# Patient Record
Sex: Female | Born: 1935 | Race: Black or African American | Hispanic: No | Marital: Single | Smoking: Never smoker
Health system: Southern US, Community
[De-identification: ages and names within clinical notes are randomized; demographics above are authoritative.]

## PROBLEM LIST (undated history)

## (undated) DIAGNOSIS — J111 Influenza due to unidentified influenza virus with other respiratory manifestations: Secondary | ICD-10-CM

## (undated) DIAGNOSIS — I480 Paroxysmal atrial fibrillation: Secondary | ICD-10-CM

## (undated) DIAGNOSIS — J40 Bronchitis, not specified as acute or chronic: Secondary | ICD-10-CM

## (undated) DIAGNOSIS — F0390 Unspecified dementia without behavioral disturbance: Secondary | ICD-10-CM

## (undated) DIAGNOSIS — I739 Peripheral vascular disease, unspecified: Secondary | ICD-10-CM

## (undated) DIAGNOSIS — I428 Other cardiomyopathies: Secondary | ICD-10-CM

## (undated) DIAGNOSIS — E039 Hypothyroidism, unspecified: Secondary | ICD-10-CM

## (undated) DIAGNOSIS — I779 Disorder of arteries and arterioles, unspecified: Secondary | ICD-10-CM

## (undated) DIAGNOSIS — Z8673 Personal history of transient ischemic attack (TIA), and cerebral infarction without residual deficits: Secondary | ICD-10-CM

## (undated) DIAGNOSIS — I701 Atherosclerosis of renal artery: Secondary | ICD-10-CM

## (undated) DIAGNOSIS — I10 Essential (primary) hypertension: Secondary | ICD-10-CM

## (undated) DIAGNOSIS — I2510 Atherosclerotic heart disease of native coronary artery without angina pectoris: Secondary | ICD-10-CM

## (undated) DIAGNOSIS — E119 Type 2 diabetes mellitus without complications: Secondary | ICD-10-CM

## (undated) HISTORY — DX: Disorder of arteries and arterioles, unspecified: I77.9

## (undated) HISTORY — DX: Hypothyroidism, unspecified: E03.9

## (undated) HISTORY — DX: Other cardiomyopathies: I42.8

## (undated) HISTORY — DX: Peripheral vascular disease, unspecified: I73.9

## (undated) HISTORY — PX: ABDOMINAL HYSTERECTOMY: SHX81

## (undated) HISTORY — DX: Personal history of transient ischemic attack (TIA), and cerebral infarction without residual deficits: Z86.73

## (undated) HISTORY — DX: Essential (primary) hypertension: I10

## (undated) HISTORY — DX: Atherosclerosis of renal artery: I70.1

---

## 2003-05-16 ENCOUNTER — Emergency Department (HOSPITAL_COMMUNITY): Admission: EM | Admit: 2003-05-16 | Discharge: 2003-05-16 | Payer: Self-pay | Admitting: Emergency Medicine

## 2003-05-16 ENCOUNTER — Encounter: Payer: Self-pay | Admitting: Emergency Medicine

## 2003-09-14 ENCOUNTER — Emergency Department (HOSPITAL_COMMUNITY): Admission: EM | Admit: 2003-09-14 | Discharge: 2003-09-14 | Payer: Self-pay | Admitting: Emergency Medicine

## 2003-09-14 ENCOUNTER — Encounter: Payer: Self-pay | Admitting: Emergency Medicine

## 2003-09-28 ENCOUNTER — Emergency Department (HOSPITAL_COMMUNITY): Admission: EM | Admit: 2003-09-28 | Discharge: 2003-09-28 | Payer: Self-pay | Admitting: Emergency Medicine

## 2003-12-12 ENCOUNTER — Emergency Department (HOSPITAL_COMMUNITY): Admission: EM | Admit: 2003-12-12 | Discharge: 2003-12-12 | Payer: Self-pay | Admitting: Internal Medicine

## 2004-01-21 ENCOUNTER — Inpatient Hospital Stay (HOSPITAL_COMMUNITY): Admission: EM | Admit: 2004-01-21 | Discharge: 2004-01-24 | Payer: Self-pay | Admitting: Emergency Medicine

## 2004-02-24 ENCOUNTER — Ambulatory Visit (HOSPITAL_COMMUNITY): Admission: RE | Admit: 2004-02-24 | Discharge: 2004-02-24 | Payer: Self-pay | Admitting: *Deleted

## 2004-04-19 ENCOUNTER — Emergency Department (HOSPITAL_COMMUNITY): Admission: EM | Admit: 2004-04-19 | Discharge: 2004-04-19 | Payer: Self-pay | Admitting: *Deleted

## 2004-06-02 ENCOUNTER — Ambulatory Visit (HOSPITAL_COMMUNITY): Admission: RE | Admit: 2004-06-02 | Discharge: 2004-06-03 | Payer: Self-pay | Admitting: *Deleted

## 2004-09-02 ENCOUNTER — Emergency Department (HOSPITAL_COMMUNITY): Admission: EM | Admit: 2004-09-02 | Discharge: 2004-09-02 | Payer: Self-pay | Admitting: Emergency Medicine

## 2004-10-28 ENCOUNTER — Emergency Department (HOSPITAL_COMMUNITY): Admission: EM | Admit: 2004-10-28 | Discharge: 2004-10-29 | Payer: Self-pay | Admitting: Emergency Medicine

## 2005-02-28 ENCOUNTER — Emergency Department (HOSPITAL_COMMUNITY): Admission: EM | Admit: 2005-02-28 | Discharge: 2005-03-01 | Payer: Self-pay | Admitting: Emergency Medicine

## 2005-04-09 ENCOUNTER — Emergency Department (HOSPITAL_COMMUNITY): Admission: EM | Admit: 2005-04-09 | Discharge: 2005-04-09 | Payer: Self-pay | Admitting: Emergency Medicine

## 2005-05-17 ENCOUNTER — Inpatient Hospital Stay (HOSPITAL_COMMUNITY): Admission: EM | Admit: 2005-05-17 | Discharge: 2005-05-19 | Payer: Self-pay | Admitting: Emergency Medicine

## 2005-05-17 ENCOUNTER — Ambulatory Visit: Payer: Self-pay | Admitting: Cardiology

## 2005-06-23 ENCOUNTER — Ambulatory Visit: Payer: Self-pay | Admitting: *Deleted

## 2005-10-24 ENCOUNTER — Emergency Department (HOSPITAL_COMMUNITY): Admission: EM | Admit: 2005-10-24 | Discharge: 2005-10-24 | Payer: Self-pay | Admitting: Emergency Medicine

## 2007-01-07 ENCOUNTER — Emergency Department (HOSPITAL_COMMUNITY): Admission: EM | Admit: 2007-01-07 | Discharge: 2007-01-07 | Payer: Self-pay | Admitting: Emergency Medicine

## 2007-11-25 ENCOUNTER — Inpatient Hospital Stay (HOSPITAL_COMMUNITY): Admission: EM | Admit: 2007-11-25 | Discharge: 2007-11-29 | Payer: Self-pay | Admitting: Emergency Medicine

## 2007-11-25 ENCOUNTER — Ambulatory Visit: Payer: Self-pay | Admitting: Cardiology

## 2007-11-25 ENCOUNTER — Encounter (INDEPENDENT_AMBULATORY_CARE_PROVIDER_SITE_OTHER): Payer: Self-pay | Admitting: Internal Medicine

## 2008-04-18 ENCOUNTER — Emergency Department (HOSPITAL_COMMUNITY): Admission: EM | Admit: 2008-04-18 | Discharge: 2008-04-18 | Payer: Self-pay | Admitting: Emergency Medicine

## 2009-12-06 ENCOUNTER — Emergency Department (HOSPITAL_COMMUNITY): Admission: EM | Admit: 2009-12-06 | Discharge: 2009-12-06 | Payer: Self-pay | Admitting: Emergency Medicine

## 2010-03-05 ENCOUNTER — Ambulatory Visit (HOSPITAL_COMMUNITY): Admission: RE | Admit: 2010-03-05 | Discharge: 2010-03-05 | Payer: Self-pay | Admitting: Family Medicine

## 2010-03-16 ENCOUNTER — Encounter (INDEPENDENT_AMBULATORY_CARE_PROVIDER_SITE_OTHER): Payer: Self-pay | Admitting: Family Medicine

## 2010-03-16 ENCOUNTER — Ambulatory Visit (HOSPITAL_COMMUNITY): Admission: RE | Admit: 2010-03-16 | Discharge: 2010-03-16 | Payer: Self-pay | Admitting: Family Medicine

## 2010-03-16 ENCOUNTER — Ambulatory Visit: Payer: Self-pay | Admitting: Cardiology

## 2010-03-26 ENCOUNTER — Ambulatory Visit: Payer: Self-pay | Admitting: Cardiology

## 2010-03-26 ENCOUNTER — Encounter (INDEPENDENT_AMBULATORY_CARE_PROVIDER_SITE_OTHER): Payer: Self-pay | Admitting: Family Medicine

## 2010-03-26 ENCOUNTER — Inpatient Hospital Stay (HOSPITAL_COMMUNITY): Admission: EM | Admit: 2010-03-26 | Discharge: 2010-03-28 | Payer: Self-pay | Admitting: Emergency Medicine

## 2010-03-29 DIAGNOSIS — J45909 Unspecified asthma, uncomplicated: Secondary | ICD-10-CM | POA: Insufficient documentation

## 2010-03-29 DIAGNOSIS — I428 Other cardiomyopathies: Secondary | ICD-10-CM | POA: Insufficient documentation

## 2010-03-29 DIAGNOSIS — I639 Cerebral infarction, unspecified: Secondary | ICD-10-CM | POA: Insufficient documentation

## 2010-03-29 DIAGNOSIS — I509 Heart failure, unspecified: Secondary | ICD-10-CM | POA: Insufficient documentation

## 2010-03-30 ENCOUNTER — Encounter (INDEPENDENT_AMBULATORY_CARE_PROVIDER_SITE_OTHER): Payer: Self-pay | Admitting: *Deleted

## 2010-03-30 LAB — CONVERTED CEMR LAB
BUN: 55 mg/dL
Brain Natriuretic Peptide: 30.4
CO2: 21 meq/L
Calcium: 9.5 mg/dL
Chloride: 107 meq/L
Creatinine, Ser: 1.92 mg/dL
Free T4: 6.1 ng/dL
Glucose, Bld: 128 mg/dL
Potassium: 4.6 meq/L
Sodium: 141 meq/L
TSH: 1.142 microintl units/mL

## 2010-04-06 ENCOUNTER — Ambulatory Visit: Payer: Self-pay | Admitting: Cardiology

## 2010-04-06 DIAGNOSIS — I2510 Atherosclerotic heart disease of native coronary artery without angina pectoris: Secondary | ICD-10-CM | POA: Insufficient documentation

## 2010-04-06 DIAGNOSIS — I10 Essential (primary) hypertension: Secondary | ICD-10-CM | POA: Insufficient documentation

## 2010-04-07 ENCOUNTER — Encounter: Payer: Self-pay | Admitting: Cardiology

## 2010-07-07 ENCOUNTER — Encounter (INDEPENDENT_AMBULATORY_CARE_PROVIDER_SITE_OTHER): Payer: Self-pay | Admitting: *Deleted

## 2010-07-08 ENCOUNTER — Ambulatory Visit: Payer: Self-pay | Admitting: Cardiology

## 2010-07-08 DIAGNOSIS — E785 Hyperlipidemia, unspecified: Secondary | ICD-10-CM

## 2010-07-08 DIAGNOSIS — E1169 Type 2 diabetes mellitus with other specified complication: Secondary | ICD-10-CM | POA: Insufficient documentation

## 2010-07-13 ENCOUNTER — Emergency Department (HOSPITAL_COMMUNITY): Admission: EM | Admit: 2010-07-13 | Discharge: 2010-07-13 | Payer: Self-pay | Admitting: Emergency Medicine

## 2010-07-16 ENCOUNTER — Encounter (INDEPENDENT_AMBULATORY_CARE_PROVIDER_SITE_OTHER): Payer: Self-pay | Admitting: *Deleted

## 2010-10-01 ENCOUNTER — Encounter: Payer: Self-pay | Admitting: Cardiology

## 2010-10-02 LAB — CONVERTED CEMR LAB
ALT: <8 units/L (ref 0–35)
AST: 11 units/L (ref 0–37)
Albumin: 4.2 g/dL (ref 3.5–5.2)
Alkaline Phosphatase: 102 units/L (ref 39–117)
BUN: 13 mg/dL (ref 6–23)
Bilirubin, Direct: 0.1 mg/dL (ref 0.0–0.3)
CO2: 23 meq/L (ref 19–32)
Calcium: 9.4 mg/dL (ref 8.4–10.5)
Chloride: 108 meq/L (ref 96–112)
Cholesterol: 155 mg/dL (ref 0–200)
Creatinine, Ser: 1.07 mg/dL (ref 0.40–1.20)
Glucose, Bld: 105 mg/dL — ABNORMAL HIGH (ref 70–99)
HDL: 32 mg/dL — ABNORMAL LOW (ref >39)
Indirect Bilirubin: 0.2 mg/dL (ref 0.0–0.9)
LDL Cholesterol: 91 mg/dL (ref 0–99)
Potassium: 3.6 meq/L (ref 3.5–5.3)
Pro B Natriuretic peptide (BNP): 155 pg/mL — ABNORMAL HIGH (ref 0.0–100.0)
Sodium: 144 meq/L (ref 135–145)
Total Bilirubin: 0.3 mg/dL (ref 0.3–1.2)
Total CHOL/HDL Ratio: 4.8
Total Protein: 6.7 g/dL (ref 6.0–8.3)
Triglycerides: 162 mg/dL — ABNORMAL HIGH (ref <150)
VLDL: 32 mg/dL (ref 0–40)

## 2010-10-04 ENCOUNTER — Encounter: Payer: Self-pay | Admitting: Cardiology

## 2010-10-06 ENCOUNTER — Ambulatory Visit: Payer: Self-pay | Admitting: Cardiology

## 2010-10-26 ENCOUNTER — Encounter: Payer: Self-pay | Admitting: Cardiology

## 2010-11-18 ENCOUNTER — Inpatient Hospital Stay (HOSPITAL_COMMUNITY): Admission: EM | Admit: 2010-11-18 | Discharge: 2010-10-18 | Payer: Self-pay | Admitting: Emergency Medicine

## 2010-11-26 ENCOUNTER — Ambulatory Visit: Payer: Self-pay | Admitting: Cardiology

## 2010-11-26 LAB — CONVERTED CEMR LAB
BUN: 9 mg/dL (ref 6–23)
CO2: 27 meq/L (ref 19–32)
Calcium: 9.6 mg/dL (ref 8.4–10.5)
Chloride: 105 meq/L (ref 96–112)
Creatinine, Ser: 1.09 mg/dL (ref 0.40–1.20)
Glucose, Bld: 107 mg/dL — ABNORMAL HIGH (ref 70–99)
Potassium: 4.1 meq/L (ref 3.5–5.3)
Sodium: 142 meq/L (ref 135–145)

## 2010-12-02 ENCOUNTER — Encounter: Payer: Self-pay | Admitting: Cardiology

## 2010-12-16 ENCOUNTER — Emergency Department (HOSPITAL_COMMUNITY)
Admission: EM | Admit: 2010-12-16 | Discharge: 2010-12-16 | Payer: Self-pay | Source: Home / Self Care | Admitting: Emergency Medicine

## 2010-12-19 ENCOUNTER — Emergency Department (HOSPITAL_COMMUNITY)
Admission: EM | Admit: 2010-12-19 | Discharge: 2010-12-19 | Payer: Self-pay | Source: Home / Self Care | Admitting: Emergency Medicine

## 2011-01-11 NOTE — Assessment & Plan Note (Signed)
Summary: **per Dr.McInnis for CHF/tg   Visit Type:  Initial Consult Primary Provider:  Dr. Butch Penny   History of Present Illness: 75 year old woman referred for cardiology consultation. She was followed in the past by Dr. Dorethea Clan with a history of nonobstructive CAD and nonischemic cardiomyopathy.  She was recently admitted to the hospital with left leg weakness and ultimately diagnosed with a right brain TIA. Followup 2-D echocardiogram is reviewed below.  Ms. Ratledge describes NYHA class 2-3 dyspnea on exertion, no chest pain, and reports compliance with her medications. She has not been weighing herself regularly. She does report a low salt diet. Today we discussed carbohydrate restriction and also considering a basic walking regimen.  Labs from late March revealed a potassium of 4.2, BUN 10, creatinine 1.0, BNP 356.  She denies any significant palpitations or syncope.  Current Medications (verified): 1)  Synthroid 75 Mcg Tabs (Levothyroxine Sodium) .... Take 1 Tab Daily 2)  Lisinopril 10 Mg Tabs (Lisinopril) .... Take 1 Tablet By Mouth Once Daily 3)  Lasix 40 Mg Tabs (Furosemide) .... Take 1 Tab Two Times A Day 4)  Coreg 12.5 Mg Tabs (Carvedilol) .... Take 1 Tab Two Times A Day 5)  Allopurinol 100 Mg Tabs (Allopurinol) .... Take As Needed 6)  Aspir-Low 81 Mg Tbec (Aspirin) .... Take 1 Tab Daily 7)  Potassium Chloride 20 Meq Pack (Potassium Chloride) .... Take 1 Tab Daily  Allergies (verified): No Known Drug Allergies  Past History:  Past Medical History: Last updated: 04/30/2010 Nonischemic cardiomyopathy, LVEF 20-25% Asthma Cerebrovascular Disease s/p TIA and stroke Hypertension Bilateral renal artery stenosis CAD - nonobstructive at catheterization 2005 Hypothyroidism Carotid artery disease  Past Surgical History: Last updated: 04-30-2010 Hysterectomy Cholecystectomy  Family History: Last updated: Apr 30, 2010 Father: died age 19, unknown causes Mother: died  age 98, unknown causes  Social History: Last updated: 04-30-2010 Tobacco Use - No Alcohol Use - no Regular Exercise - no Drug Use - no Retired  14 children  Clinical Review Panels:  Echocardiogram Echocardiogram  Study Conclusions    - Left ventricle: The cavity size was at the upper limits of normal.     Wall thickness was increased increased in a pattern of mild to     moderate LVH. Systolic function was severely reduced. The     estimated ejection fraction was in the range of 20% to 25%.     Akinesis of the inferior and posterior myocardium. Akinesis of the     anterolateral myocardium.Only the septum contracts.   - Left atrium: The atrium was mildly dilated.   - Right ventricle: The cavity size was normal. Wall thickness was     increased. Systolic function was mildly reduced.   - Atrial septum: No defect or patent foramen ovale was identified.   Impressions: (03/26/2010)  Cardiac Imaging Cardiac Cath Findings  CONCLUSION:  1. Nonobstructive coronary artery disease.  2. Nonischemic cardiomyopathy.  Possibly related to hypertension.   PLAN:  I have left a message for Dr. Samule Ohm to review the aortogram to see  if she might benefit from revascularization of her renal arteries  percutaneously.  I have discussed the case with Dr. Renard Matter.  I have also  discussed the case with Dr. Dorethea Clan who will follow her in [place].  I  have reinstituted the patient's captopril which apparently she was on but  ran out of.  We will start this at 25 mg q.8h.  I would like her to get a B-  MET next week  and follow with Dr. Dorethea Clan. (02/24/2004)    Review of Systems       The patient complains of dyspnea on exertion.  The patient denies anorexia, weight loss, chest pain, syncope, peripheral edema, prolonged cough, headaches, hemoptysis, melena, hematochezia, and severe indigestion/heartburn.         Reports no residual left leg weakness. Otherwise reviewed and negative except as  outlined.  Vital Signs:  Patient profile:   75 year old female Height:      62 inches Weight:      180 pounds BMI:     33.04 Pulse rate:   74 / minute BP sitting:   160 / 90  (right arm)  Vitals Entered By: Dreama Saa, CNA (April 06, 2010 1:51 PM)  Physical Exam  Additional Exam:  Overweight woman in no acute distress. HEENT: Conjunctiva and lids normal, oropharynx with moist mucosa. Neck: Supple, no elevated JVP is apparent, no bruits. Lungs: Diminished but clear overall, nonlabored. Cardiac: Regular rate and rhythm, no S3, soft systolic murmur. Abdomen: Obese, unable to palpate liver edge, bowel sounds present. Extremities: Trace ankle edema, symmetrical, distal pulses 1-2+. Skin: Warm and dry. Musculoskeletal: No kyphosis. Neuropsychiatric: Alert and oriented x3, affect appropriate.   EKG  Procedure date:  04/06/2010  Findings:      Sinus rhythm at 77 beats per minute with occasional PVC and IVCD with QRS 142 ms. Incomplete right bundle branch block pattern.  Impression & Recommendations:  Problem # 1:  CARDIOMYOPATHY (ICD-425.4)  History of nonischemic cardiomyopathy, LVEF recently documented in the range of 20-25%. She is now referred to reestablish follow up with our practice, having seen Dr. Dorethea Clan years ago. She reports stable NYHA class 2-3 dyspnea exertion, and appears fairly euvolemic. We talked about following weights regularly, avoiding salt, and considering a regular walking regimen with concurrent carbohydrate restriction for weight loss. Plan to see her back over the next 3 months.  The following medications were removed from the medication list:    Prinivil 10 Mg Tabs (Lisinopril) .Marland Kitchen... Take 1/2 tab daily Her updated medication list for this problem includes:    Lisinopril 10 Mg Tabs (Lisinopril) .Marland Kitchen... Take 1 tablet by mouth once daily    Lasix 40 Mg Tabs (Furosemide) .Marland Kitchen... Take 1 tab two times a day    Coreg 12.5 Mg Tabs (Carvedilol) .Marland Kitchen... Take 1 tab  two times a day    Aspir-low 81 Mg Tbec (Aspirin) .Marland Kitchen... Take 1 tab daily  Problem # 2:  ESSENTIAL HYPERTENSION, BENIGN (ICD-401.1)  Blood pressure not optimally controlled. We will increase lisinopril to 10 mg daily and followup with a BMET in 2 weeks.  The following medications were removed from the medication list:    Prinivil 10 Mg Tabs (Lisinopril) .Marland Kitchen... Take 1/2 tab daily Her updated medication list for this problem includes:    Lisinopril 10 Mg Tabs (Lisinopril) .Marland Kitchen... Take 1 tablet by mouth once daily    Lasix 40 Mg Tabs (Furosemide) .Marland Kitchen... Take 1 tab two times a day    Coreg 12.5 Mg Tabs (Carvedilol) .Marland Kitchen... Take 1 tab two times a day    Aspir-low 81 Mg Tbec (Aspirin) .Marland Kitchen... Take 1 tab daily  Problem # 3:  CORONARY ATHEROSCLEROSIS NATIVE CORONARY ARTERY (ICD-414.01)  History of nonobstructive CAD at catheterization in 2005. No active angina.  The following medications were removed from the medication list:    Prinivil 10 Mg Tabs (Lisinopril) .Marland Kitchen... Take 1/2 tab daily Her updated medication list for this problem  includes:    Lisinopril 10 Mg Tabs (Lisinopril) .Marland Kitchen... Take 1 tablet by mouth once daily    Coreg 12.5 Mg Tabs (Carvedilol) .Marland Kitchen... Take 1 tab two times a day    Aspir-low 81 Mg Tbec (Aspirin) .Marland Kitchen... Take 1 tab daily  Other Orders: Future Orders: T-Basic Metabolic Panel [id number]) ... 04/20/2010  Patient Instructions: 1)  Your physician recommends that you schedule a follow-up appointment in: 3months 2)  Your physician recommends that you return for lab work in: 2 weeks  3)  Your physician has recommended you make the following change in your medication: Increase Lisinopril to 10mg  by mouth once daily  Prescriptions: LISINOPRIL 10 MG TABS (LISINOPRIL) take 1 tablet by mouth once daily  #30 x 6   Entered by:   Larita Fife Via LPN   Authorized by:   Loreli Slot, MD, [facility]   Signed by:   Larita Fife Via LPN on 36/64/4034   Method used:   Electronically to        [facility]. [address] [phone]* (retail)       [address], [place]  [address]       Ph: [phone]       Fax: [phone]   RxID:   [id number]

## 2011-01-11 NOTE — Letter (Signed)
Summary: labs dr Renard Matter  labs dr Renard Matter   Imported By: Faythe Ghee 04/07/2010 10:29:38  _____________________________________________________________________  External Attachment:    Type:   Image     Comment:   External Document

## 2011-01-11 NOTE — Letter (Signed)
Summary: [facility] [employer]  [address], [place] [address]   Phone: [phone]  Fax: [phone]     April 06, 2010 MRN: [id number]   [name] [address] [place], [place]  [address]      YOUR LAB WORK IS DUE  Apr 20, 2010 _________________________________________  Please go to [facility], located across the street from [facility] on the second floor.  Hours are Monday - Friday 7am until 7:30pm         Saturday 8am until 12noon    __  DO NOT EAT OR DRINK AFTER MIDNIGHT EVENING PRIOR TO LABWORK  _X_ YOUR LABWORK IS NOT FASTING --YOU MAY EAT PRIOR TO LABWORK

## 2011-01-11 NOTE — Letter (Signed)
Summary: [facility] [employer]  [address], [place] [address]   Phone: [phone]  Fax: [phone]     October 06, 2010 MRN: [id number]   CHARRISSE MASLEY [address] [place], [place]  [address]      YOUR LAB WORK IS DUE  November 08, 2010 _________________________________________  Please go to [facility], located across the street from [facility] on the second floor.  Hours are Monday - Friday 7am until 7:30pm         Saturday 8am until 12noon    __  DO NOT EAT OR DRINK AFTER MIDNIGHT EVENING PRIOR TO LABWORK  _X_ YOUR LABWORK IS NOT FASTING --YOU MAY EAT PRIOR TO LABWORK

## 2011-01-11 NOTE — Miscellaneous (Signed)
Summary: LABS BMP,TSH,T4,BNP,03/30/2010  Clinical Lists Changes  Observations: Added new observation of CALCIUM: 9.5 mg/dL (81/19/1478 29:56) Added new observation of CREATININE: 1.92 mg/dL (21/30/8657 84:69) Added new observation of BUN: 55 mg/dL (62/95/2841 32:44) Added new observation of BG RANDOM: 128 mg/dL (12/14/7251 66:44) Added new observation of CO2 PLSM/SER: 21 meq/L (03/30/2010 13:50) Added new observation of CL SERUM: 107 meq/L (03/30/2010 13:50) Added new observation of K SERUM: 4.6 meq/L (03/30/2010 13:50) Added new observation of NA: 141 meq/L (03/30/2010 13:50) Added new observation of TSH: 1.142 microintl units/mL (03/30/2010 13:50) Added new observation of T4, FREE: 6.1 ng/dL (03/47/4259 56:38) Added new observation of BNP: 30.4  (03/30/2010 13:50)

## 2011-01-11 NOTE — Assessment & Plan Note (Signed)
Summary: 3 mth f/u per checkout on 04/06/10/tg   Visit Type:  Follow-up Primary Provider:  Dr. Butch Penny   History of Present Illness: 75 year old woman presents for followup. She did not followup for blood work after her last visit. It is not entirely clear that she has been taking all her medications either. From a symptom perspective, she reports feeling fatigued in the mornings, although this generally improves as the day goes on. No orthopnea, PND, or progressive lower extremity edema. No chest pain with exertion.  Current Medications (verified): 1)  Lisinopril 10 Mg Tabs (Lisinopril) .... Take 1 Tablet By Mouth Once Daily 2)  Furosemide 20 Mg Tabs (Furosemide) .... Take 1 Tablet By Mouth Two Times A Day 3)  Coreg 12.5 Mg Tabs (Carvedilol) .... Take 1 Tab Two Times A Day 4)  Allopurinol 100 Mg Tabs (Allopurinol) .... Take As Needed 5)  Aspir-Low 81 Mg Tbec (Aspirin) .... Take 1 Tab Daily 6)  Potassium Chloride Crys Cr 20 Meq Cr-Tabs (Potassium Chloride Crys Cr) .... Take 1 Tablet By Mouth Once A Day  Allergies (verified): No Known Drug Allergies  Past History:  Past Medical History: Last updated: 04/02/2010 Nonischemic cardiomyopathy, LVEF 20-25% Asthma Cerebrovascular Disease s/p TIA and stroke Hypertension Bilateral renal artery stenosis CAD - nonobstructive at catheterization 2005 Hypothyroidism Carotid artery disease  Social History: Last updated: 04/02/2010 Tobacco Use - No Alcohol Use - no Regular Exercise - no Drug Use - no Retired  14 children  Clinical Review Panels:  Echocardiogram Echocardiogram  Study Conclusions    - Left ventricle: The cavity size was at the upper limits of normal.     Wall thickness was increased increased in a pattern of mild to     moderate LVH. Systolic function was severely reduced. The     estimated ejection fraction was in the range of 20% to 25%.     Akinesis of the inferior and posterior myocardium. Akinesis of the    anterolateral myocardium.Only the septum contracts.   - Left atrium: The atrium was mildly dilated.   - Right ventricle: The cavity size was normal. Wall thickness was     increased. Systolic function was mildly reduced.   - Atrial septum: No defect or patent foramen ovale was identified.   Impressions: (03/26/2010)    Review of Systems       The patient complains of dyspnea on exertion.  The patient denies anorexia, fever, chest pain, syncope, peripheral edema, melena, and hematochezia.         No unusual weight gain. Otherwise reviewed and negative except as outlined.  Vital Signs:  Patient profile:   75 year old female Height:      62 inches Weight:      177 pounds O2 Sat:      94 % on Room air Temp:     98.7 degrees F oral Pulse rate:   70 / minute BP sitting:   151 / 86  (left arm)  Vitals Entered By: Teressa Lower RN (July 08, 2010 11:06 AM)  O2 Flow:  Room air  Physical Exam  Additional Exam:  Overweight woman in no acute distress. HEENT: Conjunctiva and lids normal, oropharynx with moist mucosa. Neck: Supple, no elevated JVP is apparent, no bruits. Lungs: Diminished but clear overall, nonlabored. Cardiac: Regular rate and rhythm, no S3, soft systolic murmur. Abdomen: Obese, unable to palpate liver edge, bowel sounds present. Extremities: Trace ankle edema, symmetrical, distal pulses 1-2+. Skin: Warm and dry. Musculoskeletal:  No kyphosis. Neuropsychiatric: Alert and oriented x3, affect appropriate.   Impression & Recommendations:  Problem # 1:  ESSENTIAL HYPERTENSION, BENIGN (ICD-401.1)  Blood pressure not optimal, but coming under better control. She reports tolerating the increase in lisinopril made at the last visit. She does need followup labs to include a BMET and BNP. These will be arranged.  Her updated medication list for this problem includes:    Lisinopril 10 Mg Tabs (Lisinopril) .Marland Kitchen... Take 1 tablet by mouth once daily    Furosemide 20 Mg Tabs  (Furosemide) .Marland Kitchen... Take 1 tablet by mouth two times a day    Coreg 12.5 Mg Tabs (Carvedilol) .Marland Kitchen... Take 1 tab two times a day    Aspir-low 81 Mg Tbec (Aspirin) .Marland Kitchen... Take 1 tab daily  Orders: T-Basic Metabolic Panel [id number])  Her updated medication list for this problem includes:    Lisinopril 10 Mg Tabs (Lisinopril) .Marland Kitchen... Take 1 tablet by mouth once daily    Furosemide 20 Mg Tabs (Furosemide) .Marland Kitchen... Take 1 tablet by mouth two times a day    Coreg 12.5 Mg Tabs (Carvedilol) .Marland Kitchen... Take 1 tab two times a day    Aspir-low 81 Mg Tbec (Aspirin) .Marland Kitchen... Take 1 tab daily  Problem # 2:  CORONARY ATHEROSCLEROSIS NATIVE CORONARY ARTERY (ICD-414.01)  No reported angina with prior documentation of nonobstructive atherosclerosis. From the perspective of risk modification, a lipid profile will be obtained. May also need to consider statin therapy.  Her updated medication list for this problem includes:    Lisinopril 10 Mg Tabs (Lisinopril) .Marland Kitchen... Take 1 tablet by mouth once daily    Coreg 12.5 Mg Tabs (Carvedilol) .Marland Kitchen... Take 1 tab two times a day    Aspir-low 81 Mg Tbec (Aspirin) .Marland Kitchen... Take 1 tab daily  Her updated medication list for this problem includes:    Lisinopril 10 Mg Tabs (Lisinopril) .Marland Kitchen... Take 1 tablet by mouth once daily    Coreg 12.5 Mg Tabs (Carvedilol) .Marland Kitchen... Take 1 tab two times a day    Aspir-low 81 Mg Tbec (Aspirin) .Marland Kitchen... Take 1 tab daily  Problem # 3:  CARDIOMYOPATHY (ICD-425.4)  Nonischemic cardiopathy. Euvolemic on examination. Reviewed medications and reinforced compliance. Further adjustments to be made gradually depending on her compliance. Device therapy not being pursued. Followup in 3 months.  Her updated medication list for this problem includes:    Lisinopril 10 Mg Tabs (Lisinopril) .Marland Kitchen... Take 1 tablet by mouth once daily    Furosemide 20 Mg Tabs (Furosemide) .Marland Kitchen... Take 1 tablet by mouth two times a day    Coreg 12.5 Mg Tabs (Carvedilol) .Marland Kitchen... Take 1 tab two times a  day    Aspir-low 81 Mg Tbec (Aspirin) .Marland Kitchen... Take 1 tab daily  Orders: T-Basic Metabolic Panel [id number])  Her updated medication list for this problem includes:    Lisinopril 10 Mg Tabs (Lisinopril) .Marland Kitchen... Take 1 tablet by mouth once daily    Furosemide 20 Mg Tabs (Furosemide) .Marland Kitchen... Take 1 tablet by mouth two times a day    Coreg 12.5 Mg Tabs (Carvedilol) .Marland Kitchen... Take 1 tab two times a day    Aspir-low 81 Mg Tbec (Aspirin) .Marland Kitchen... Take 1 tab daily  Other Orders: T-BNP  (B Natriuretic Peptide) [phone]) T-Lipid Profile ([id number]) T-Hepatic Function [phone])  Patient Instructions: 1)  Your physician recommends that you schedule a follow-up appointment in: 3 months 2)  Your physician recommends that you return for lab work in: Tomorrow 3)  Your physician recommends that you continue  on your current medications as directed. Please refer to the Current Medication list given to you today.

## 2011-01-11 NOTE — Letter (Signed)
Summary: [facility]  [facility]   Imported By: Faythe Ghee 10/26/2010 16:08:16  _____________________________________________________________________  External Attachment:    Type:   Image     Comment:   External Document

## 2011-01-11 NOTE — Assessment & Plan Note (Signed)
Summary: 3 mth f/u per checkout on 07/08/10/tg   Visit Type:  Follow-up Primary Provider:  Dr. Butch Penny   History of Present Illness: 75 year old woman presents for followup. I saw her back in July. She denies any progressive shortness of breath or chest pain. No palpitations or syncope. She states that she takes her medicines regularly when she is able to afford them. We discussed compliance and also sodium restriction. She seems to be euvolemic based on recent labs and examination. Weight is down a few pounds today.  Followup labs from 21 October show sodium 144, potassium 3.6, BUN 13, creatinine 1.0, cholesterol 155, triglycerides 162, HDL 32, LDL 91, AST 11, ALT less than 8, BNP 155. We discussed these results.  Clinical Review Panels:  Echocardiogram Echocardiogram  Study Conclusions    - Left ventricle: The cavity size was at the upper limits of normal.     Wall thickness was increased increased in a pattern of mild to     moderate LVH. Systolic function was severely reduced. The     estimated ejection fraction was in the range of 20% to 25%.     Akinesis of the inferior and posterior myocardium. Akinesis of the     anterolateral myocardium.Only the septum contracts.   - Left atrium: The atrium was mildly dilated.   - Right ventricle: The cavity size was normal. Wall thickness was     increased. Systolic function was mildly reduced.   - Atrial septum: No defect or patent foramen ovale was identified.   Impressions: (03/26/2010)    Current Medications (verified): 1)  Furosemide 20 Mg Tabs (Furosemide) .... Take 1 Tablet By Mouth Two Times A Day 2)  Coreg 12.5 Mg Tabs (Carvedilol) .... Take 1 Tab Two Times A Day 3)  Allopurinol 100 Mg Tabs (Allopurinol) .... Take As Needed 4)  Aspir-Low 81 Mg Tbec (Aspirin) .... Take 1 Tab Daily 5)  Potassium Chloride Crys Cr 20 Meq Cr-Tabs (Potassium Chloride Crys Cr) .... Take 1 Tablet By Mouth Once A Day 6)  Lisinopril 20 Mg Tabs  (Lisinopril) .... Take 1 Tablet By Mouth Once Daily  Allergies (verified): No Known Drug Allergies  Comments:  Nurse/Medical Assistant: patient reviewed meds from previous ov and stated that meds are correct she is out of asa and is going to get more .  Past History:  Past Medical History: Last updated: 04/02/2010 Nonischemic cardiomyopathy, LVEF 20-25% Asthma Cerebrovascular Disease s/p TIA and stroke Hypertension Bilateral renal artery stenosis CAD - nonobstructive at catheterization 2005 Hypothyroidism Carotid artery disease  Social History: Last updated: 04/02/2010 Tobacco Use - No Alcohol Use - no Regular Exercise - no Drug Use - no Retired  14 children  Review of Systems       The patient complains of peripheral edema.  The patient denies anorexia, fever, weight gain, chest pain, syncope, dyspnea on exertion, prolonged cough, hemoptysis, melena, and hematochezia.         Occasional dependent edema, not progressive, no orthopnea or PND. Otherwise reviewed and negative.  Vital Signs:  Patient profile:   75 year old female Weight:      173 pounds BMI:     31.76 Pulse rate:   56 / minute BP sitting:   178 / 84  (right arm)  Vitals Entered By: Dreama Saa, CNA (October 06, 2010 1:00 PM)   Physical Exam  Additional Exam:  Overweight woman in no acute distress. HEENT: Conjunctiva and lids normal, oropharynx with moist mucosa. Neck:  Supple, no elevated JVP is apparent, no bruits. Lungs: Diminished but clear overall, nonlabored. Cardiac: Regular rate and rhythm, no S3, soft systolic murmur. Abdomen: Obese, unable to palpate liver edge, bowel sounds present. Extremities: Trace ankle edema, symmetrical, distal pulses 1-2+. Skin: Warm and dry. Musculoskeletal: No kyphosis. Neuropsychiatric: Alert and oriented x3, affect appropriate.   Impression & Recommendations:  Problem # 1:  ESSENTIAL HYPERTENSION, BENIGN (ICD-401.1)  Not well controlled. We will  advance lisinopril to 20 mg daily. Continue other medications. We discussed sodium restriction. I plan to see her back in the next 6 weeks with a BMET.  Her updated medication list for this problem includes:    Furosemide 20 Mg Tabs (Furosemide) .Marland Kitchen... Take 1 tablet by mouth two times a day    Coreg 12.5 Mg Tabs (Carvedilol) .Marland Kitchen... Take 1 tab two times a day    Aspir-low 81 Mg Tbec (Aspirin) .Marland Kitchen... Take 1 tab daily    Lisinopril 20 Mg Tabs (Lisinopril) .Marland Kitchen... Take 1 tablet by mouth once daily  Future Orders: T-Basic Metabolic Panel [id number]) ... 11/08/2010  Problem # 2:  CARDIOMYOPATHY (ICD-425.4)  Euvolemic with nonischemic myopathy. No change in diuretic regimen. Recent renal function normal.  Her updated medication list for this problem includes:    Furosemide 20 Mg Tabs (Furosemide) .Marland Kitchen... Take 1 tablet by mouth two times a day    Coreg 12.5 Mg Tabs (Carvedilol) .Marland Kitchen... Take 1 tab two times a day    Aspir-low 81 Mg Tbec (Aspirin) .Marland Kitchen... Take 1 tab daily    Lisinopril 20 Mg Tabs (Lisinopril) .Marland Kitchen... Take 1 tablet by mouth once daily  Problem # 3:  CORONARY ATHEROSCLEROSIS NATIVE CORONARY ARTERY (ICD-414.01)  Nonobstructive at catheterization 2005, no active angina.  Her updated medication list for this problem includes:    Coreg 12.5 Mg Tabs (Carvedilol) .Marland Kitchen... Take 1 tab two times a day    Aspir-low 81 Mg Tbec (Aspirin) .Marland Kitchen... Take 1 tab daily    Lisinopril 20 Mg Tabs (Lisinopril) .Marland Kitchen... Take 1 tablet by mouth once daily  Patient Instructions: 1)  Your physician recommends that you schedule a follow-up appointment in: 6 weeks 2)  Your physician recommends that you return for lab work in: 6 weeks, just before next office visit 3)  Your physician has recommended you make the following change in your medication: Increase Lisinopril 20mg  by mouth at bedtime  Prescriptions: LISINOPRIL 20 MG TABS (LISINOPRIL) take 1 tablet by mouth once daily  #30 x 3   Entered by:   Larita Fife Via LPN    Authorized by:   Loreli Slot, MD, [facility]   Signed by:   Larita Fife Via LPN on 09/81/1914   Method used:   Electronically to        [facility] [phone]* (retail)       [address] [place], [place]  [address]       Ph: [phone]       Fax: [phone]   RxID:   [id number] CRESTOR 20 MG TABS (ROSUVASTATIN CALCIUM) take 1 tablet by mouth at bedtime  #30 x 3   Entered by:   Larita Fife Via LPN   Authorized by:   Loreli Slot, MD, [facility]   Signed by:   Larita Fife Via LPN on 42/59/5638   Method used:   Electronically to        [facility] [phone]* (retail)       [address]  [place], [place]  [address]       Ph: [phone]       Fax: [phone]   RxID:   [id number]  Pt. does not take Crestor, pharmacy notified

## 2011-01-11 NOTE — Letter (Signed)
Summary: [place] [facility], [facility] at [facility]  [address], [place] [address]   Phone: [phone]  Fax: [phone]      October 04, 2010 MRN: [id number]   STEVE GREGG [address] [place], [place]  [address]   Dear Ms. Garton,  Your test ordered by Selena Batten has been reviewed by your physician (or physician assistant) and was found to be normal or stable. Your physician (or physician assistant) felt no changes were needed at this time.  ____ Echocardiogram  ____ Cardiac Stress Test  __X__ Lab Work  ____ Peripheral vascular study of arms, legs or neck  ____ CT scan or X-ray  ____ Lung or Breathing test  ____ Other:  Please continue on current medical treatment. Thank you.   Nona Dell, MD, F.A.C.C

## 2011-01-11 NOTE — Letter (Signed)
Summary: progress note dr Renard Matter  progress note dr Renard Matter   Imported By: Faythe Ghee 04/07/2010 10:29:09  _____________________________________________________________________  External Attachment:    Type:   Image     Comment:   External Document

## 2011-01-11 NOTE — Letter (Signed)
Summary: Appointment - Reschedule  [facility] [place]  [address], [place] [address]   Phone: [phone]  Fax: [phone]     July 16, 2010 MRN: [id number]   [name] [address] [place], [place]  [address]   Dear Ms. Kilgour,   Due to a change in our office schedule, your appointment on       10/06/10                at    11:00  was changed to 10/06/10 at  1:00 Please contact us at the number listed above with any questions.    Sincerely,  Glass blower/designer

## 2011-01-13 NOTE — Assessment & Plan Note (Signed)
Summary: 6 wk f/u per checkout on 10/06/10/tg   Visit Type:  Follow-up Primary Provider:  Dr. Butch Penny   History of Present Illness: 75 year old woman presents for followup. Patient was seen back in October. She reports no progressive breathlessness, feels generally weak, but has had no exertional chest pain or peripheral edema. She does not exercise regularly. Talked about a basic walking regimen. Also reminded her about diet and sodium restriction.  ACE Inhibitor dosing was advanced at the last visit, and followup labs from 15 December showed sodium 142, potassium 4.1, BUN 9, creatinine 1.0. She tolerated this. I also noted she was placed on low-dose Benicar. Blood pressure is still not optimally controlled.  Clinical Review Panels:  Echocardiogram Echocardiogram  Study Conclusions    - Left ventricle: The cavity size was at the upper limits of normal.     Wall thickness was increased increased in a pattern of mild to     moderate LVH. Systolic function was severely reduced. The     estimated ejection fraction was in the range of 20% to 25%.     Akinesis of the inferior and posterior myocardium. Akinesis of the     anterolateral myocardium.Only the septum contracts.   - Left atrium: The atrium was mildly dilated.   - Right ventricle: The cavity size was normal. Wall thickness was     increased. Systolic function was mildly reduced.   - Atrial septum: No defect or patent foramen ovale was identified.   Impressions: (03/26/2010)    Current Medications (verified): 1)  Furosemide 20 Mg Tabs (Furosemide) .... Take 1 Tablet By Mouth Two Times A Day 2)  Coreg 12.5 Mg Tabs (Carvedilol) .... Take 1 Tab Two Times A Day 3)  Allopurinol 100 Mg Tabs (Allopurinol) .... Take As Needed 4)  Aspir-Low 81 Mg Tbec (Aspirin) .... Take 1 Tab Daily 5)  Potassium Chloride Crys Cr 20 Meq Cr-Tabs (Potassium Chloride Crys Cr) .... Take 1 Tablet By Mouth Once A Day 6)  Lisinopril 40 Mg Tabs  (Lisinopril) .... Take One Tablet By Mouth Daily 7)  Benicar 20 Mg Tabs (Olmesartan Medoxomil) .... Take 1/2 Tab Daily 8)  Clonidine Hcl 0.1 Mg Tabs (Clonidine Hcl) .... Take 1 Tab Q12 Hrs  Allergies (verified): No Known Drug Allergies  Comments:  Nurse/Medical Assistant: patient reviewed meds from previous ov and stated all meds are correct benicar 20mg  1/2 tab has been assed and clonidine 0.1 mg q 12 hrs [facility] is patients pharmacy  Past History:  Past Medical History: Last updated: 04/02/2010 Nonischemic cardiomyopathy, LVEF 20-25% Asthma Cerebrovascular Disease s/p TIA and stroke Hypertension Bilateral renal artery stenosis CAD - nonobstructive at catheterization 2005 Hypothyroidism Carotid artery disease  Social History: Last updated: 04/02/2010 Tobacco Use - No Alcohol Use - no Regular Exercise - no Drug Use - no Retired  14 children  Review of Systems       The patient complains of dyspnea on exertion.  The patient denies anorexia, fever, weight gain, chest pain, syncope, peripheral edema, prolonged cough, melena, and hematochezia.         Otherwise reviewed and negative except as outlined.  Vital Signs:  Patient profile:   75 year old female Weight:      173 pounds O2 Sat:      94 % on Room air Pulse rate:   65 / minute BP sitting:   159 / 90  (right arm)  Vitals Entered By: Dreama Saa, CNA (November 26, 2010 1:14 PM)  O2 Flow:  Room air  Physical Exam  Additional Exam:  Overweight woman in no acute distress. HEENT: Conjunctiva and lids normal, oropharynx with moist mucosa. Neck: Supple, no elevated JVP is apparent, no bruits. Lungs: Diminished but clear overall, nonlabored. Cardiac: Regular rate and rhythm, no S3, soft systolic murmur. Abdomen: Obese, unable to palpate liver edge, bowel sounds present. Extremities: Trace ankle edema, symmetrical, distal pulses 1-2+. Skin: Warm and dry. Musculoskeletal: No kyphosis. Neuropsychiatric: Alert  and oriented x3, affect appropriate.   Impression & Recommendations:  Problem # 1:  CARDIOMYOPATHY (ICD-425.4)  Symptomatically stable with nonischemic cardiomyopathy. Volume control appears adequate. We discussed a basic walking regimen. Medications have been modified over time. Continue to work on better blood pressure control.  Her updated medication list for this problem includes:    Furosemide 20 Mg Tabs (Furosemide) .Marland Kitchen... Take 1 tablet by mouth two times a day    Coreg 12.5 Mg Tabs (Carvedilol) .Marland Kitchen... Take 1 tab two times a day    Aspir-low 81 Mg Tbec (Aspirin) .Marland Kitchen... Take 1 tab daily    Lisinopril 40 Mg Tabs (Lisinopril) .Marland Kitchen... Take one tablet by mouth daily    Benicar 20 Mg Tabs (Olmesartan medoxomil) .Marland Kitchen... Take 1/2 tab daily  Problem # 2:  ESSENTIAL HYPERTENSION, BENIGN (ICD-401.1)  Advanced lisinopril to 40 mg daily. Followup unit in 2 weeks.  Her updated medication list for this problem includes:    Furosemide 20 Mg Tabs (Furosemide) .Marland Kitchen... Take 1 tablet by mouth two times a day    Coreg 12.5 Mg Tabs (Carvedilol) .Marland Kitchen... Take 1 tab two times a day    Aspir-low 81 Mg Tbec (Aspirin) .Marland Kitchen... Take 1 tab daily    Lisinopril 40 Mg Tabs (Lisinopril) .Marland Kitchen... Take one tablet by mouth daily    Benicar 20 Mg Tabs (Olmesartan medoxomil) .Marland Kitchen... Take 1/2 tab daily    Clonidine Hcl 0.1 Mg Tabs (Clonidine hcl) .Marland Kitchen... Take 1 tab q12 hrs  Problem # 3:  CORONARY ATHEROSCLEROSIS NATIVE CORONARY ARTERY (ICD-414.01)  Nonobstructive, no active angina.  Her updated medication list for this problem includes:    Coreg 12.5 Mg Tabs (Carvedilol) .Marland Kitchen... Take 1 tab two times a day    Aspir-low 81 Mg Tbec (Aspirin) .Marland Kitchen... Take 1 tab daily    Lisinopril 40 Mg Tabs (Lisinopril) .Marland Kitchen... Take one tablet by mouth daily  Other Orders: Future Orders: T-Basic Metabolic Panel [id number]) ... 12/10/2010  Patient Instructions: 1)  Your physician recommends that you schedule a follow-up appointment in: 3 months 2)   Your physician recommends that you return for lab work in:2 weeks 3)  Your physician has recommended you make the following change in your medication: increase lisinopril to 40mg  daily Prescriptions: LISINOPRIL 40 MG TABS (LISINOPRIL) Take one tablet by mouth daily  #30 x 3   Entered by:   Teressa Lower RN   Authorized by:   Loreli Slot, MD, [facility]   Signed by:   Teressa Lower RN on 11/26/2010   Method used:   Electronically to        [facility]. [phone]* (retail)       [address], [place]  [address]       Ph: [phone]       Fax: [phone]   RxID:   [id number]

## 2011-01-13 NOTE — Letter (Signed)
Summary: [facility]  [facility]   Imported By: Faythe Ghee 12/02/2010 09:53:29  _____________________________________________________________________  External Attachment:    Type:   Image     Comment:   External Document

## 2011-02-22 LAB — BASIC METABOLIC PANEL
BUN: 11 mg/dL (ref 6–23)
BUN: 13 mg/dL (ref 6–23)
CO2: 24 mEq/L (ref 19–32)
CO2: 29 mEq/L (ref 19–32)
Calcium: 9.3 mg/dL (ref 8.4–10.5)
Calcium: 9.5 mg/dL (ref 8.4–10.5)
Chloride: 108 mEq/L (ref 96–112)
Chloride: 108 mEq/L (ref 96–112)
Creatinine, Ser: 1.04 mg/dL (ref 0.4–1.2)
Creatinine, Ser: 1.05 mg/dL (ref 0.4–1.2)
GFR calc Af Amer: >60 mL/min (ref >60)
GFR calc Af Amer: >60 mL/min (ref >60)
GFR calc non Af Amer: 52 mL/min — ABNORMAL LOW (ref >60)
GFR calc non Af Amer: 52 mL/min — ABNORMAL LOW (ref >60)
Glucose, Bld: 135 mg/dL — ABNORMAL HIGH (ref 70–99)
Glucose, Bld: 138 mg/dL — ABNORMAL HIGH (ref 70–99)
Potassium: 3.2 mEq/L — ABNORMAL LOW (ref 3.5–5.1)
Potassium: 4 mEq/L (ref 3.5–5.1)
Sodium: 141 mEq/L (ref 135–145)
Sodium: 143 mEq/L (ref 135–145)

## 2011-02-22 LAB — POCT CARDIAC MARKERS
CKMB, poc: 1.1 ng/mL (ref 1.0–8.0)
Myoglobin, poc: 87.8 ng/mL (ref 12–200)
Troponin i, poc: <0.05 ng/mL (ref 0.00–0.09)

## 2011-02-22 LAB — URINE CULTURE
Colony Count: >=100000
Culture  Setup Time: 201111052028
Special Requests: NEGATIVE

## 2011-02-22 LAB — LIPID PANEL
Cholesterol: 183 mg/dL (ref 0–200)
HDL: 38 mg/dL — ABNORMAL LOW (ref >39)
LDL Cholesterol: 117 mg/dL — ABNORMAL HIGH (ref 0–99)
Total CHOL/HDL Ratio: 4.8 RATIO
Triglycerides: 142 mg/dL (ref <150)
VLDL: 28 mg/dL (ref 0–40)

## 2011-02-22 LAB — URINE MICROSCOPIC-ADD ON

## 2011-02-22 LAB — DIFFERENTIAL
Basophils Absolute: 0 10*3/uL (ref 0.0–0.1)
Basophils Relative: 1 % (ref 0–1)
Eosinophils Absolute: 0.1 10*3/uL (ref 0.0–0.7)
Eosinophils Relative: 2 % (ref 0–5)
Lymphocytes Relative: 31 % (ref 12–46)
Lymphs Abs: 2 10*3/uL (ref 0.7–4.0)
Monocytes Absolute: 0.6 10*3/uL (ref 0.1–1.0)
Monocytes Relative: 9 % (ref 3–12)
Neutro Abs: 3.6 10*3/uL (ref 1.7–7.7)
Neutrophils Relative %: 58 % (ref 43–77)

## 2011-02-22 LAB — URINALYSIS, ROUTINE W REFLEX MICROSCOPIC
Bilirubin Urine: NEGATIVE
Glucose, UA: NEGATIVE mg/dL
Hgb urine dipstick: NEGATIVE
Ketones, ur: NEGATIVE mg/dL
Leukocytes, UA: NEGATIVE
Nitrite: POSITIVE — AB
Protein, ur: NEGATIVE mg/dL
Specific Gravity, Urine: 1.015 (ref 1.005–1.030)
Urobilinogen, UA: 0.2 mg/dL (ref 0.0–1.0)
pH: 7 (ref 5.0–8.0)

## 2011-02-22 LAB — CBC
HCT: 37.6 % (ref 36.0–46.0)
Hemoglobin: 12.6 g/dL (ref 12.0–15.0)
MCH: 27.7 pg (ref 26.0–34.0)
MCHC: 33.5 g/dL (ref 30.0–36.0)
MCV: 82.6 fL (ref 78.0–100.0)
Platelets: 249 10*3/uL (ref 150–400)
RBC: 4.56 MIL/uL (ref 3.87–5.11)
RDW: 12.9 % (ref 11.5–15.5)
WBC: 6.3 10*3/uL (ref 4.0–10.5)

## 2011-02-22 LAB — MRSA PCR SCREENING: MRSA by PCR: NEGATIVE

## 2011-02-25 LAB — URINALYSIS, ROUTINE W REFLEX MICROSCOPIC
Bilirubin Urine: NEGATIVE
Glucose, UA: NEGATIVE mg/dL
Hgb urine dipstick: NEGATIVE
Ketones, ur: NEGATIVE mg/dL
Leukocytes, UA: NEGATIVE
Nitrite: POSITIVE — AB
Specific Gravity, Urine: >1.03 — ABNORMAL HIGH (ref 1.005–1.030)
Urobilinogen, UA: 0.2 mg/dL (ref 0.0–1.0)
pH: 5.5 (ref 5.0–8.0)

## 2011-02-25 LAB — URINE MICROSCOPIC-ADD ON

## 2011-02-28 ENCOUNTER — Ambulatory Visit (HOSPITAL_COMMUNITY)
Admission: RE | Admit: 2011-02-28 | Discharge: 2011-02-28 | Disposition: A | Discharge status: Home / Self Care | Payer: PRIVATE HEALTH INSURANCE | Source: Ambulatory Visit | Attending: Family Medicine | Admitting: Family Medicine

## 2011-02-28 ENCOUNTER — Ambulatory Visit: Payer: Self-pay | Admitting: Adult Health

## 2011-02-28 ENCOUNTER — Ambulatory Visit: Payer: Self-pay | Admitting: Cardiology

## 2011-02-28 ENCOUNTER — Other Ambulatory Visit (HOSPITAL_COMMUNITY): Payer: Self-pay | Admitting: Family Medicine

## 2011-02-28 DIAGNOSIS — M25539 Pain in unspecified wrist: Secondary | ICD-10-CM | POA: Insufficient documentation

## 2011-02-28 DIAGNOSIS — M25529 Pain in unspecified elbow: Secondary | ICD-10-CM | POA: Insufficient documentation

## 2011-02-28 DIAGNOSIS — R52 Pain, unspecified: Secondary | ICD-10-CM

## 2011-02-28 DIAGNOSIS — M19039 Primary osteoarthritis, unspecified wrist: Secondary | ICD-10-CM | POA: Insufficient documentation

## 2011-03-01 ENCOUNTER — Encounter: Payer: Self-pay | Admitting: Adult Health

## 2011-03-01 LAB — BASIC METABOLIC PANEL
BUN: 25 mg/dL — ABNORMAL HIGH (ref 6–23)
BUN: 45 mg/dL — ABNORMAL HIGH (ref 6–23)
CO2: 23 mEq/L (ref 19–32)
CO2: 26 mEq/L (ref 19–32)
Calcium: 9.5 mg/dL (ref 8.4–10.5)
Calcium: 9.8 mg/dL (ref 8.4–10.5)
Chloride: 104 mEq/L (ref 96–112)
Chloride: 109 mEq/L (ref 96–112)
Creatinine, Ser: 1.45 mg/dL — ABNORMAL HIGH (ref 0.4–1.2)
Creatinine, Ser: 2.04 mg/dL — ABNORMAL HIGH (ref 0.4–1.2)
GFR calc Af Amer: 29 mL/min — ABNORMAL LOW (ref >60)
GFR calc Af Amer: 43 mL/min — ABNORMAL LOW (ref >60)
GFR calc non Af Amer: 24 mL/min — ABNORMAL LOW (ref >60)
GFR calc non Af Amer: 36 mL/min — ABNORMAL LOW (ref >60)
Glucose, Bld: 127 mg/dL — ABNORMAL HIGH (ref 70–99)
Glucose, Bld: 145 mg/dL — ABNORMAL HIGH (ref 70–99)
Potassium: 4.1 mEq/L (ref 3.5–5.1)
Potassium: 4.7 mEq/L (ref 3.5–5.1)
Sodium: 136 mEq/L (ref 135–145)
Sodium: 141 mEq/L (ref 135–145)

## 2011-03-01 LAB — DIFFERENTIAL
Basophils Absolute: 0 10*3/uL (ref 0.0–0.1)
Basophils Relative: 1 % (ref 0–1)
Eosinophils Absolute: 0.1 10*3/uL (ref 0.0–0.7)
Eosinophils Relative: 1 % (ref 0–5)
Lymphocytes Relative: 36 % (ref 12–46)
Lymphs Abs: 1.9 10*3/uL (ref 0.7–4.0)
Monocytes Absolute: 0.4 10*3/uL (ref 0.1–1.0)
Monocytes Relative: 7 % (ref 3–12)
Neutro Abs: 2.9 10*3/uL (ref 1.7–7.7)
Neutrophils Relative %: 55 % (ref 43–77)

## 2011-03-01 LAB — CBC
HCT: 39.5 % (ref 36.0–46.0)
Hemoglobin: 13.4 g/dL (ref 12.0–15.0)
MCHC: 33.8 g/dL (ref 30.0–36.0)
MCV: 83.3 fL (ref 78.0–100.0)
Platelets: 229 10*3/uL (ref 150–400)
RBC: 4.74 MIL/uL (ref 3.87–5.11)
RDW: 13.9 % (ref 11.5–15.5)
WBC: 5.3 10*3/uL (ref 4.0–10.5)

## 2011-03-01 LAB — TSH: TSH: 2.059 u[IU]/mL (ref 0.350–4.500)

## 2011-03-25 ENCOUNTER — Ambulatory Visit (INDEPENDENT_AMBULATORY_CARE_PROVIDER_SITE_OTHER): Payer: PRIVATE HEALTH INSURANCE | Admitting: Cardiology

## 2011-03-25 ENCOUNTER — Encounter: Payer: Self-pay | Admitting: Cardiology

## 2011-03-25 VITALS — BP 143/79 | HR 58 | Ht 63.0 in | Wt 173.0 lb

## 2011-03-25 DIAGNOSIS — I428 Other cardiomyopathies: Secondary | ICD-10-CM

## 2011-03-25 DIAGNOSIS — I10 Essential (primary) hypertension: Secondary | ICD-10-CM

## 2011-03-25 DIAGNOSIS — I2510 Atherosclerotic heart disease of native coronary artery without angina pectoris: Secondary | ICD-10-CM

## 2011-03-25 NOTE — Progress Notes (Signed)
Clinical Summary [name] is a 75 y.o.female presenting for followup. She was seen in December 2011. Lisinopril was advanced at that time. BMET showed sodium 142, potassium 4.1, BUN 9, creatinine 1.0.  She reports no chest pain, has NYHA class 2-3 dyspnea on exertion. She is not exercising, states that she watches TV most of the time. She does have some daytime somnolence, also feels fatigued after her morning medicines. Today we discussed better sleep habits, the possibility of a sleep study at some point, and also splitting up her once daily medicines in the a.m. and p.m. to see if this helps her symptoms.  She is not describing any palpitations, has had no syncope.   No Known Allergies  Current outpatient prescriptions:allopurinol (ZYLOPRIM) 100 MG tablet, Take 100 mg by mouth daily.  , Disp: , Rfl: ;  aspirin 81 MG tablet, Take 81 mg by mouth daily.  , Disp: , Rfl: ;  carvedilol (COREG) 12.5 MG tablet, Take 12.5 mg by mouth 2 (two) times daily with a meal.  , Disp: , Rfl: ;  cloNIDine (CATAPRES) 0.1 MG tablet, Take 0.1 mg by mouth 2 (two) times daily.  , Disp: , Rfl:  furosemide (LASIX) 20 MG tablet, Take 20 mg by mouth 2 (two) times daily.  , Disp: , Rfl: ;  lisinopril (PRINIVIL,ZESTRIL) 40 MG tablet, Take 40 mg by mouth daily. , Disp: , Rfl: ;  olmesartan (BENICAR) 20 MG tablet, Take 20 mg by mouth daily. 1/2 tab daily , Disp: , Rfl: ;  potassium chloride SA (K-DUR,KLOR-CON) 20 MEQ tablet, Take 20 mEq by mouth 2 (two) times daily.  , Disp: , Rfl:   Past Medical History  Diagnosis Date  . Nonischemic cardiomyopathy     LVEF 20-25%  . Asthma   . Cerebrovascular disease     TIA and stroke   . Hypertension   . Renal artery stenosis     Bilaterial   . CAD (coronary artery disease)     Nonobstructive at cath 2005  . Hypothyroidism   . Carotid artery disease     Social History [name] reports that she has never smoked. She has never used smokeless tobacco. [name] reports that  she does not drink alcohol.  Review of Systems Otherwise reviewed and negative except as outlined.  Physical Examination Filed Vitals:   03/25/11 1453  BP: 143/79  Pulse: 58  Overweight woman in no acute distress. HEENT: Conjunctiva and lids normal, oropharynx with moist mucosa. Neck: Supple, no elevated JVP is apparent, no bruits. Lungs: Diminished but clear overall, nonlabored. Cardiac: Regular rate and rhythm, no S3, soft systolic murmur. Abdomen: Obese, unable to palpate liver edge, bowel sounds present. Extremities: Trace ankle edema, symmetrical, distal pulses 1-2+. Skin: Warm and dry. Musculoskeletal: No kyphosis. Neuropsychiatric: Alert and oriented x3, affect appropriate.   Studies Echocardiogram 03/26/2010: Upper normal LV chamber size with mild to moderate LVH, LVEF 20-25%, mild left atrial enlargement, mildly reduced right ventricular function, no major abnormalities.  Problem List and Plan

## 2011-03-25 NOTE — Assessment & Plan Note (Signed)
No reported angina, nonobstructive disease documented.

## 2011-03-25 NOTE — Patient Instructions (Signed)
Your physician recommends that you continue on your current medications as directed. Please refer to the Current Medication list given to you today.  Your physician recommends that you schedule a follow-up appointment in: 6 months  

## 2011-03-25 NOTE — Assessment & Plan Note (Signed)
Nonischemic cardiomyopathy, pursuing medical therapy at this point. Patient states that she does not wish to pursue further invasive evaluation or modalities at this time. I did review her medical therapy, and we have looked at splitting up some of her once daily medications so that she is not taking them all at one time. She will let us know how she does symptomatically.

## 2011-03-25 NOTE — Assessment & Plan Note (Signed)
Continue to address sodium restriction, compliance with medications.

## 2011-03-28 ENCOUNTER — Other Ambulatory Visit: Payer: Self-pay | Admitting: Cardiology

## 2011-03-29 ENCOUNTER — Other Ambulatory Visit: Payer: Self-pay

## 2011-03-29 DIAGNOSIS — I10 Essential (primary) hypertension: Secondary | ICD-10-CM

## 2011-03-29 MED ORDER — LISINOPRIL 40 MG PO TABS: 40.0000 mg | ORAL_TABLET | Freq: Every day | ORAL | Status: DC

## 2011-04-26 NOTE — Consult Note (Signed)
[name], [name]               ACCOUNT NO.:  [id number]   MEDICAL RECORD NO.:  [id number]          PATIENT TYPE:  INP   LOCATION:  A208                          FACILITY:  [facility]   PHYSICIAN:  Gerrit Friends. Dietrich Pates, MD, FACCDATE OF BIRTH:  March 17, 1936   DATE OF CONSULTATION:  11/26/2007  DATE OF DISCHARGE:                                 CONSULTATION   REFERRING PHYSICIAN:  Angus G. Katie Matter, MD.   PRIMARY CARDIOLOGIST:  Previously Dr. Dorethea Clan.   HISTORY OF PRESENT ILLNESS:  A 75 year old woman with nonischemic  cardiomyopathy admitted to [facility] with recurrent congestive  heart failure.  Ms. [name] was first evaluated by our group in the mid  part of this decade.  She presents with congestive heart failure and  ultimately underwent cardiac catheterization demonstrating moderately  severe depression of left ventricular systolic function, marked  elevation in left atrial pressure and insignificant coronary disease.  She subsequently did fairly well with adjustment of her medical therapy,  but was last seen in our office in July 2006.  She only developed  decompensation over the past week or so when she noted nocturnal  wheezing and dyspnea.  This was relieved with a nebulized beta-agonist.  She ultimately presented to the hospital where she was found to have  pulmonary edema and was admitted.  She is now markedly improved after  initial diuresis.  The most recent echocardiogram available is from 2005  at which time ejection fraction was estimated to be 30 - 35%,  indistinguishable from the estimate at catheterization a few months  earlier.  There were no significant valvular abnormalities.  It was also  noted at catheterization was moderately severe bilateral renal artery  stenosis.  It was decided not to perform percutaneous intervention at  that time.  Ms. [name] has history of hypertension that has been well-  controlled.   PAST MEDICAL HISTORY:  Is otherwise notable for  asthma, which may  represent cardiac asthma, a hysterectomy and cholecystectomy.   ALLERGIES:  She has no known medical allergies.   MEDICATIONS:  Recent medications have included aspirin, furosemide 20 mg  daily and benazepril 10 mg daily.   SOCIAL HISTORY:  Resides locally; no use of tobacco products; no use of  alcohol.   FAMILY HISTORY:  Is negative for coronary disease.   REVIEW OF SYSTEMS:  Is notable for a possible history of CVA.  She has  also undergone appendectomy in the past.  She refuses routine  vaccinations.  She notes some chronic constipation and a low-salt diet  at home.  She has impaired vision and requires corrective lenses, both  at distance and for near vision.  She has upper dentures.  All other  systems reviewed and are negative.   PHYSICAL EXAMINATION:  GENERAL:  Somewhat lethargic but alert woman in  no acute distress.  VITAL SIGNS:  The temperature is 98.2, heart rate 80 and regular,  respirations 18, blood pressure 140/90, weight 77 kg, 1-1/2 kg decrease  since admission.  O2 saturation 98% on 2 liters.  HEENT:  Slight bilateral arcus; EOMs full;  normal lids and conjunctivae.  NECK:  No jugular venous distention; normal carotid upstrokes without  bruits.  ENDOCRINE:  No thyromegaly.  HEMATOPOIETIC:  No adenopathy.  LUNGS:  Clear.  CARDIAC:  Normal first and second heart sounds; no third heart sounds;  modest systolic ejection murmur.  ABDOMEN:  Soft and nontender; normal bowel sounds; no bruits; no masses;  no organomegaly.  EXTREMITIES:  1/2+ edema; distal pulses intact.  NEUROMUSCULAR:  Symmetric strength and tone; normal cranial nerves.  PSYCHIATRIC:  Alert and oriented; normal affect.  SKIN:  No significant lesions.   Chest x-ray:  Cardiomegaly with early pulmonary edema.   EKG:  Normal sinus rhythm; left atrial abnormality; borderline first-  degree AV block; a right bundle branch block; LVH; rightward axis.   Other laboratory notable for  a BUN of 15 with a creatinine of 1.2,  normal electrolytes, a normal CBC and normal cardiac markers.   IMPRESSION:  Ms. [name] is experiencing recurrent congestive heart  failure in the setting of unknown compliance with medical therapy, which  was inadequate to begin with.  We will repeat an echocardiogram.  If  left ventricular systolic function remains substantially depressed,  appropriate medications will be provided.  We will try to arrange for  stable follow-up and encourage compliance with medical therapy.      Gerrit Friends. Dietrich Pates, MD, [facility]  Electronically Signed     RMR/MEDQ  D:  11/26/2007  T:  11/27/2007  Job:  [id number]

## 2011-04-26 NOTE — Group Therapy Note (Signed)
[name], [name]               ACCOUNT NO.:  [id number]   MEDICAL RECORD NO.:  [id number]          PATIENT TYPE:  INP   LOCATION:  A208                          FACILITY:  [facility]   PHYSICIAN:  Angus G. Renard Matter, MD   DATE OF BIRTH:  Mar 26, 1936   DATE OF PROCEDURE:  DATE OF DISCHARGE:                                 PROGRESS NOTE   This patient was admitted through ED with presentation of shortness of  breath.  She was evaluated by ED physician and thought to be in  pulmonary edema.  These symptoms began on day of admission, were  moderately severe.   OBJECTIVE:  VITAL SIGNS:  Blood pressure 148/90, respiration 18, pulse  88, temp 97.9, O2 sats between 97 and 98.  Chemistries remain relatively  normal with exception of slight elevation of creatinine 1.22 and glucose  119. BNP 524. The patient remains alert.  LUNGS:  Rhonchi bilaterally.  HEART:  Regular rhythm.  ABDOMEN:  No palpable organs or masses.   ASSESSMENT:  The patient was admitted with what was felt to be pulmonary  edema.   PLAN:  To continue current regimen.  Continue diuresis.  The patient  will be seen by cardiology today as well.      Angus G. Renard Matter, MD  Electronically Signed     AGM/MEDQ  D:  11/26/2007  T:  11/26/2007  Job:  [id number]

## 2011-04-26 NOTE — Procedures (Signed)
NAMEBRIETTA, [name]               ACCOUNT NO.:  [id number]   MEDICAL RECORD NO.:  [id number]          PATIENT TYPE:  INP   LOCATION:  A208                          FACILITY:  [facility]   PHYSICIAN:  Gerrit Friends. Dietrich Pates, MD, FACCDATE OF BIRTH:  [birth year]   DATE OF PROCEDURE:  11/26/2007  DATE OF DISCHARGE:                                ECHOCARDIOGRAM   REFERRING:  Dr. Renard Matter.   CLINICAL DATA:  A 75 year old woman with a history of cardiomyopathy and  congestive heart failure.   M-MODE:  Aorta 3.0, left atrium 4.4, septum 1.6, posterior wall 2.0, LV  diastole 5.3, LV systole 5.0.   1. Technically suboptimal due to malfunction of the ultrasound system.  2. Mild right atrial enlargement; moderate left atrial enlargement.  3. Normal right ventricular size; no RVH; mild to moderate impairment      in RV systolic function.  4. Normal proximal ascending aorta; normal in trileaflet aortic valve.  5. Slight mitral valve thickening with mild annular calcification and      moderate regurgitation.  6. Normal pulmonic valve and proximal pulmonary artery.  7. Normal tricuspid valve; physiologic regurgitation.  8. Normal IVC.  9. Mild left ventricular dilatation; moderate hypertrophy; global      hypokinesis with severely impaired overall LV systolic function;      estimated ejection fraction is 0.25.  10.Comparison with prior study of June 02, 2004:  There has been      further interval decrease in ejection fraction; LV dysfunction is      more global although the intraoral septal contractility is better      than that of the lateral wall, which is virtually akinetic.      Gerrit Friends. Dietrich Pates, MD, [facility]  Electronically Signed     RMR/MEDQ  D:  11/26/2007  T:  11/27/2007  Job:  [id number]

## 2011-04-26 NOTE — Group Therapy Note (Signed)
NAMEAMORIE, RENTZ               ACCOUNT NO.:  [id number]   MEDICAL RECORD NO.:  [id number]          PATIENT TYPE:  INP   LOCATION:  A208                          FACILITY:  [facility]   PHYSICIAN:  Angus G. Renard Matter, MD   DATE OF BIRTH:  [birth year]   DATE OF PROCEDURE:  11/28/2007  DATE OF DISCHARGE:                                 PROGRESS NOTE   This patient was admitted with shortness of breath, was thought to be in  pulmonary edema.  She has begun to diurese and is feeling some better.  She did have a drop in blood pressure yesterday, which concerned nursing  staff. She does have a prior history of findings of severe depression  left ventricular systolic function and insignificant coronary artery  disease. She does have a depressed ejection fraction 0.25, left  ventricular dilatation, moderate hypertrophy, global hypokinesis and  severely impaired left ventricular systolic function.   OBJECTIVE:  VITAL SIGNS:  Blood pressure 113/69, respiration 18, pulse  73, temperature 98.6, O2 sats range of 97-98; chemistries- sodium 137,  potassium 3.9, chloride 103, CO2 25, glucose 125, BUN 34, creatinine  1.77.  BNP is 229.0. The patient has negative fluid balance of 650.  LUNGS:  Diminished breath sounds.  HEART:  Regular rhythm.  ABDOMEN:  No palpable organs or masses.  Minimal edema in extremities.   ASSESSMENT:  The patient was admitted in pulmonary edema, congestive  heart failure. She does have impaired overall left ventricle systolic  function, left ventricular dilatation, moderate hypertrophy.   PLAN:  Plan is to continue current regimen. Will continue to monitor.  Chemistries. Continue to monitory BMETS.      Angus G. Renard Matter, MD  Electronically Signed     AGM/MEDQ  D:  11/28/2007  T:  11/28/2007  Job:  [id number]

## 2011-04-26 NOTE — Group Therapy Note (Signed)
[name], [name]               ACCOUNT NO.:  [id number]   MEDICAL RECORD NO.:  [id number]          PATIENT TYPE:  INP   LOCATION:  A208                          FACILITY:  [facility]   PHYSICIAN:  Angus G. Renard Matter, MD   DATE OF BIRTH:  1936/06/19   DATE OF PROCEDURE:  DATE OF DISCHARGE:                                 PROGRESS NOTE   This patient was admitted to the ED with a presentation of shortness of  breath.  She was thought to be in pulmonary edema.  She has begun to  diurese and is feeling some better.   OBJECTIVE:  VITAL SIGNS:  Blood pressure 137/88, respirations 18, pulse  86, temp 98.6, O2 sats range from 96 to 92.  PERTINENT LAB DATA:  Chemistries:  Sodium 140, potassium 3.8, chloride  107, CO2 26, glucose 111, BUN 20, creatinine 1.13.  LUNGS:  Diminished breath sounds.  HEART:  Regular rhythm.  ABDOMEN:  No palpable organs or masses.   ASSESSMENT:  The patient was admitted with what was felt to be pulmonary  edema, congestive heart failure.  She has been seen by Cardiology  Service.  A 2-D echo was performed.   PLAN:  To continue current regimen.  Repeat BMP, BNP.      Angus G. Renard Matter, MD  Electronically Signed     AGM/MEDQ  D:  11/27/2007  T:  11/27/2007  Job:  [id number]

## 2011-04-26 NOTE — H&P (Signed)
NAMEDANAYSIA, RADER               ACCOUNT NO.:  [id number]   MEDICAL RECORD NO.:  [id number]          PATIENT TYPE:  INP   LOCATION:  A208                          FACILITY:  [facility]   PHYSICIAN:  Angus G. Renard Matter, MD   DATE OF BIRTH:  05-04-1936   DATE OF ADMISSION:  11/25/2007  DATE OF DISCHARGE:  LH                              HISTORY & PHYSICAL   A 75 year old female admitted through the ED with presentation of  shortness of breath.  She was seen and evaluated by ED physician and  thought to be in pulmonary edema.  These symptoms began on day of  admission, were moderately severe, she was dyspneic and had cough and  wheezing.  She was given medications, started diuresing in ED and  improved.   LABORATORY DATA:  CBC with wbc 6200, hemoglobin 30.2, hematocrit of  40.2, 61% neutrophils.  The patient had cardiac markers showing  myoglobin 98.3, CPK-MB 2.7, troponin less than 0.05.  Chemistries with  sodium 142, potassium 2.9, chloride 104, CO2 23, glucose 118, BUN 9,  creatinine 0.98, calcium 9.3.  GFR greater than 60.  A BNP was 524.   ALLERGIES:  NO KNOWN DRUG ALLERGIES.   MEDICATION:  The patient takes aspirin at home.   SOCIAL HISTORY:  The patient does not smoke or drink alcohol.   FAMILY HISTORY:  See previous record.   PAST MEDICAL HISTORY:  The patient has  1. History of asthma.  2. Hypertension.  3. Previous stroke.  4. Possible previous MI.   REVIEW OF SYSTEMS:  HEENT: Negative.  CARDIOPULMONARY:  Patient has had  dyspnea but has not had chest pain.  GI: No vomiting or diarrhea.  GU:  No dysuria or hematuria.   PHYSICAL EXAMINATION:  GENERAL APPEARANCE:  Alert female.  VITAL SIGNS:  Blood pressure 147/106, respiration 22, pulse 93,  temperature 97.6.  HEENT:  Eyes: PERRLA.  TMs negative.  Oropharynx benign.  NECK:  Supple.  No JVD or thyroid abnormalities.  BREASTS:  Normal with no masses.  HEART:  Regular rhythm, no murmurs.  LUNGS: Clear to P&A.  ABDOMEN:   No palpable organs or masses.  EXTREMITIES:  Free of edema.   ASSESSMENT:  1. Patient admitted in pulmonary edema.  2. History of hypertension.  3. Prior history of asthma.   PLAN:  Continue current regimen.      Angus G. Renard Matter, MD  Electronically Signed     AGM/MEDQ  D:  11/25/2007  T:  11/25/2007  Job:  [id number]

## 2011-04-29 NOTE — Discharge Summary (Signed)
[name], [name]               ACCOUNT NO.:  [id number]   MEDICAL RECORD NO.:  [id number]          PATIENT TYPE:  INP   LOCATION:  A208                          FACILITY:  [facility]   PHYSICIAN:  Angus G. Renard Matter, MD   DATE OF BIRTH:  January 29, 1936   DATE OF ADMISSION:  11/25/2007  DATE OF DISCHARGE:  12/18/2008LH                               DISCHARGE SUMMARY   A 75 year old female admitted on November 25, 2007 discharged November 29, 2007, four days' hospitalization.   DIAGNOSES:  1. Pulmonary edema with chronic systolic heart failure.  2. Primary cardiomyopathy.  3. Coronary artery disease.  4. History of hypertension.  5. Prior history of asthma.   CONDITION:  Stable and improved at the time of discharge.   A 75 year old female admitted through the ED with presentation there  with shortness of breath.  She was seen and evaluated by the ED  physician and thought to be in pulmonary edema.  These symptoms began on  the day of admission, were moderately severe.  She was dyspneic and had  cough and wheezing.  She was given medication and started diuresing in  the ED and improved.   PHYSICAL EXAMINATION:  VITAL SIGNS:  Alert female with blood pressure of  147/106, respirations 22, pulse 93, temperature 97.6.  HEENT:  Eyes revealed the pupils were equal, round and reactive to light  and accommodation.  TMs negative.  Oropharynx benign.  NECK:  Supple.  No JVD or thyroid abnormalities.  BREASTS:  Normal, no masses.  HEART:  Regular rhythm, no murmurs.  LUNGS:  Clear to percussion and auscultation.  ABDOMEN:  No palpable organs or masses.  EXTREMITIES:  Free of edema.   LABORATORY DATA:  Admission CBC revealed a WBC of 6200 with hemoglobin  of 13.2, hematocrit 40.2.  Subsequent CBC on November 29, 2007 revealed  a WBC of 5700 with a hemoglobin 13.0 and hematocrit 39.8.  Chemistries  on admission revealed a sodium of 142, potassium 2.9, chloride 104, CO2  23, glucose 118, BUN 9,  creatinine 0.98.  Subsequent chemistries on  November 28, 2007 revealed a sodium of 137, potassium 3.9, chloride 103,  CO2 25, glucose 123, BUN 34, creatinine 1.77.  November 29, 2007  chemistries revealed a sodium of 138, potassium 3.7, chloride 104,  glucose 126, BUN 46, creatinine 2.03, GFR 56.  Cardiac markers on  admission revealed a CK of 66, CK-MB 2, troponin 0.05 and subsequently  myoglobin 98.3, CK-MB 2.7, troponin less than 0.05.  Chest x-ray on  admission showed a cardiomegaly with early congestive heart failure.  Electrocardiogram revealed normal sinus rhythm, right bundle branch  block.   HOSPITAL COURSE:  At the time of her admission the patient was placed on  a low-sodium diet, daily weights and I's and O's.  A saline lock was  started.  Cardiac markers were monitored and remained normal.  She was  given intravenous Lasix 40 mg b.i.d., and nasal O2 at two liters per  minute.  The patient did have an episode of hypokalemia and had to have  three runs of  KCL and subsequently was started on 40 mEq of KCL daily.  The patient improved during the hospital stay.  She was seen by [facility]  Cardiology and started on lisinopril 5 mg daily, EC-ASA 81 mg daily,  Coreg 6.25 mg b.i.d., Aldactone 25 mg daily.  Her Lasix was increased to  40 mg every eight hours.  The lisinopril was increased to 10 mg daily  and Coreg 12.5 mg b.i.d.  The patient's lisinopril was decreased to 5 mg  daily on November 27, 2007.  The patient did show progressive  improvement during her hospital stay and was able to be discharged on  the fourth hospital day.   The patient was discharged on:  1. Aspirin 81 mg daily.  2. Potassium 20 mEq daily.  3. Aldactone 25 mg daily.  4. Coreg 12.5 mg b.i.d.  5. Prinivil 5 mg daily.  6. Furosemide 40 mg daily.   The patient was stable and improved at the time of her discharge.      Angus G. Renard Matter, MD  Electronically Signed     AGM/MEDQ  D:  12/14/2007  T:   12/14/2007  Job:  [id number]

## 2011-04-29 NOTE — Cardiovascular Report (Signed)
NAME:  [name], [name]                         ACCOUNT NO.:  [id number]   MEDICAL RECORD NO.:  [id number]                   PATIENT TYPE:  OIB   LOCATION:  2854                                 FACILITY:  [facility]   PHYSICIAN:  Rollene Rotunda, M.D.                DATE OF BIRTH:  February 11, 1936   DATE OF PROCEDURE:  02/24/2004  DATE OF DISCHARGE:  02/24/2004                              CARDIAC CATHETERIZATION   PROCEDURE:  Left heart cardiac catheterization/coronary arteriography.   CARDIOLOGIST:  Rollene Rotunda, M.D.   PRIMARY CARE PHYSICIAN:  Angus G. Renard Matter, M.D.   INDICATIONS:  Evaluate patient with cardiomyopathy, heart failure, and  difficulty to control hypertension.   DESCRIPTION OF PROCEDURE:  Left heart catheterization was performed via the  right femoral artery.  The artery was cannulated using an anterior wall  puncture.  A #6 French arterial sheath was inserted via the modified  Seldinger technique.  Preformed Judkins and a pigtail catheter were  utilized.  The  patient tolerated the procedure well and left the lab in stable condition.   RESULTS:  HEMODYNAMICS:  LV151/38, AO 151/74.   CORONARY ARTERIOGRAPHY:  1. The left main coronary artery was normal.  2. The left anterior descending had a long mid 30-40% stenosis.  3. The first diagonal was large and normal.  4. The circumflex and the AV groove was normal.  There was a large branching     mid obtuse marginal.  The superior branch had ostial 30% stenosis.  5. The right coronary artery was a large dominant vessel with a large PDA.     It was normal.   LEFT VENTRICULOGRAM:  The left ventriculogram was obtained in the RAO  projection.  The ejection fraction was approximately 30-35% with global  hypokinesis.   DISTAL AORTOGRAM:  The distal aortogram was obtained secondary to very  difficult to control hypertension.  She also had a creatinine of 1.5 in the  past.  Her left renal artery demonstrated a focal 80% lesion.   The right  renal artery had a long probable 75% lesion.   CONCLUSION:  1. Nonobstructive coronary artery disease.  2. Nonischemic cardiomyopathy.  Possibly related to hypertension.   PLAN:  I have left a message for Dr. Samule Ohm to review the aortogram to see  if she might benefit from revascularization of her renal arteries  percutaneously.  I have discussed the case with Dr. Renard Matter.  I have also  discussed the case with Dr. Dorethea Clan who will follow her in [place].  I  have reinstituted the patient's captopril which apparently she was on but  ran out of.  We will start this at 25 mg q.8h.  I would like her to get a B-  MET next week and follow with Dr. Dorethea Clan.  Rollene Rotunda, M.D.    JH/MEDQ  D:  02/24/2004  T:  02/25/2004  Job:  [id number]

## 2011-04-29 NOTE — Procedures (Signed)
NAME:  [name], [name]                         ACCOUNT NO.:  [id number]   MEDICAL RECORD NO.:  [id number]                   PATIENT TYPE:  INP   LOCATION:  IC04                                 FACILITY:  [facility]   PHYSICIAN:  Hunnewell [name], M.D.               DATE OF BIRTH:  07/12/1936   DATE OF PROCEDURE:  01/21/2004  DATE OF DISCHARGE:                                  ECHOCARDIOGRAM   CLINICAL DATA:  A 75 year old woman with dyspnea.   M-MODE:  1. Aorta 2.8.  2. Left atrium 4.3.  3. Septum 1.4.  4. Posterior wall 1.3.  5. LV diastole 5.4.  6. LV systole 4.7.   FINDINGS:  1. Technically-adequate echocardiographic study.  2. Mild left atrial enlargement; normal right atrial size.  3. Normal right ventricular size and function.  4. Mild right ventricular hypertrophy.  5. Minimal aortic valvular sclerosis.  6. Mild mitral valve thickening and mild annular calcification; mild-to-     moderate regurgitation.  7. Normal tricuspid and pulmonic valves.  8. Very mild left ventricular dilatation; mild-to-moderate hypertrophy.  9. Severe hypokinesis of the inferior and posterior segments.  10.      Overall, LV systolic function is moderately impaired with an     estimated ejection fraction of 0.35.  11.      Normal inferior vena cava.      ___________________________________________                                            [name], M.D.   RR/MEDQ  D:  01/21/2004  T:  01/21/2004  Job:  [id number]

## 2011-04-29 NOTE — H&P (Signed)
NAME:  [name], [name]               ACCOUNT NO.:  [id number]   MEDICAL RECORD NO.:  [id number]          PATIENT TYPE:  INP   LOCATION:  A226                          FACILITY:  [facility]   PHYSICIAN:  Angus G. Renard Matter, MD   DATE OF BIRTH:  06-26-1936   DATE OF ADMISSION:  05/16/2005  DATE OF DISCHARGE:  LH                                HISTORY & PHYSICAL   A 75 year old African-American female admitted through the ED with chief  problem being shortness of breath of two days' duration. Apparently, the  patient does have a  history of asthma with occasional tachycardia. Has had  some difficulty for two days. She was evaluated by emergency room physician.  Her chest x-ray suggested edema and vascular congestion. The patient had a  BNP of 762. She was felt to be in congestive heart failure and was treated  with Lasix 40 mg IV, began to diurese. Labs on admission showed a pH if  7.395 with a pCO2 of 33.6, pO2 55.7. CBC:  WBC 5,300 with hemoglobin of  13.7, hematocrit 41.4. Pulse oximetry 95. The patient was subsequently  admitted to CC&T.   FAMILY HISTORY:  Positive for coronary artery disease.   SOCIAL HISTORY:  The patient does not smoke or drink alcohol.   PAST MEDICAL AND SURGICAL HISTORY:  1.  The patient has a previous history of asthma.  2.  Hypertension.  3.  Congestive heart failure.   ALLERGIES:  The patient had no known allergies.   MEDICATIONS:  Lasix 40 mg.   REVIEW OF SYSTEMS:  HEENT:  Negative. CARDIOPULMONARY:  The patient has had  shortness of breath for several days. GASTROINTESTINAL:  She had no bowel  irregularity or bleeding. GENITOURINARY:  No dysuria or hematuria.   PHYSICAL EXAMINATION:  GENERAL:  Alert female with blood pressure 155/109,  respirations 24, heart rate 112.  HEENT:  Eyes:  PERRLA. TMs negative. Oropharynx benign.  NECK:  Supple. No JVD or thyroid abnormalities.  LUNGS:  Diminished breath sounds. Rhonchi and wheezes over lower lung field.  HEART:   Regular rhythm. No murmurs.  ABDOMEN:  No palpable organs or masses.  SKIN:  Warm and dry.  EXTREMITIES:  Free of edema.  NEUROLOGICAL:  No focal deficits.   DIAGNOSES:  1.  Congestive heart failure.  2.  History of asthma.  3.  The patient has also hypokalemia and was given p.o. KCl and two runs of      KCl in ED.      AGM/MEDQ  D:  05/17/2005  T:  05/17/2005  Job:  [id number]

## 2011-04-29 NOTE — H&P (Signed)
NAME:  [name], [name]                         ACCOUNT NO.:  [id number]   MEDICAL RECORD NO.:  [id number]                   PATIENT TYPE:  INP   LOCATION:  IC04                                 FACILITY:  [facility]   PHYSICIAN:  Angus G. Renard Matter, M.D.              DATE OF BIRTH:  Mar 03, 1936   DATE OF ADMISSION:  01/20/2004  DATE OF DISCHARGE:                                HISTORY & PHYSICAL   HISTORY OF PRESENT ILLNESS:  This 75 year old, African-American female was  admitted through the ED apparently having presented there with shortness of  breath which occurred over a period of several hours.  Apparently, the  patient thought that she was having an attack of asthma.  She was having  some respiratory distress at the time of her admission to the ED.  She was  coughing yellow sputum.  She was evaluated by the ED physician.  Her chest x-  ray showed evidence of cardiomegaly and congestive heart failure.   LABORATORY DATA AND X-RAY FINDINGS:  WBC 7400, hemoglobin 13.6, hematocrit  40.7.  Chemistries with sodium 142, potassium 3.4, chloride 109, CO2 29,  glucose 193, creatinine 1, calcium 9.4.  ABGs with pH 7.390, pCO2 38, pO2  67.8 with oxygen saturation 93%.  BNP was 580.  Cardiac enzymes with CPK 76,  CPK-MB 1.8, troponin 0.01.   The patient was started on Lasix and admitted as an overflow patient to the  ICU.   SOCIAL HISTORY:  The patient does not drink alcohol or smoke.   FAMILY HISTORY:  See previous record.   PAST MEDICAL HISTORY:  1. Asthma.  2. CHF.   MEDICATIONS:  1. Lasix 20 mg b.i.d.  2. Albuterol p.r.n.   ALLERGIES:  No known drug allergies.   REVIEW OF SYMPTOMS:  HEENT:  Negative.  CARDIOPULMONARY:  The patient had  shortness of breath and productive cough.  GASTROINTESTINAL:  No bowel  irregularity or bleeding.  GU:  No dysuria or hematuria.   PHYSICAL EXAMINATION:  GENERAL:  Acute ill female.  VITAL SIGNS:  Blood pressure 208/138, pulse 51, respirations 48.  HEENT:  PERRLA.  TMs negative.  Oropharynx benign.  NECK:  Supple with no JVD or thyroid abnormalities.  LUNGS:  Rhonchi and rales over the lower lung field.  HEART:  Sinus tachycardia.  No cardiomegaly or murmurs.  ABDOMEN:  No palpable organs or masses.  SKIN:  Warm and dry.  EXTREMITIES:  Trace of edema bilaterally.  PELVIC:  Not performed.  NEUROLOGIC:  No focal deficits.   ASSESSMENT:  Congestive heart failure with associated bronchospasm.     ___________________________________________                                         Ishmael Holter Renard Matter, M.D.   AGM/MEDQ  D:  01/21/2004  T:  01/21/2004  Job:  [id number]

## 2011-04-29 NOTE — Discharge Summary (Signed)
NAME:  [name], [name]               ACCOUNT NO.:  [id number]   MEDICAL RECORD NO.:  [id number]          PATIENT TYPE:  INP   LOCATION:  A226                          FACILITY:  [facility]   PHYSICIAN:  Angus G. Renard Matter, MD   DATE OF BIRTH:  10-02-1936   DATE OF ADMISSION:  05/16/2005  DATE OF DISCHARGE:  06/08/2006LH                                 DISCHARGE SUMMARY   This 75 year old female was admitted 05/17/05 and discharged 05/19/05 with two  days hospitalization.   DIAGNOSIS:  Congestive heart failure, hypertension, hypokalemia, history of  cardiomyopathy.  The patient's condition is stable at the time of her  discharge.  This patient was admitted with chief problem being shortness of  breath of two days duration.  She does have a history of asthma and  occasional tachycardia.  She has had some difficulty for two days.  She was  evaluated by ED physician.  Chest x-ray revealed evidence of edema and  vascular congestion.  A BNP was 762.  It was felt that the patient was in  congestive heart failure and was treated with Lasix intravenously 40 mg.  She began to diurese.  She did have a blood gas at the time of admission  which showed a pH of 7.395 with a pCO2 of 33.6, and pO2 of 55.7.   PHYSICAL EXAMINATION:  GENERAL:  Alert female.  VITAL SIGNS:  Blood pressure of 155/109, respirations 24, pulse rate 112.  HEENT:  Eyes:  PERRLA.  TMs negative.  Oropharynx benign.  NECK:  Neck is supple with no JVD or thyroid abnormalities.  RESPIRATORY:  Lungs:  Diminished breath sounds, rhonchi and wheezes over the  lower lung fields.  CARDIAC:  Heart:  Regular rhythm with no murmurs.  ABDOMEN:  No palpable organs or masses.  SKIN:  Warm and dry.  EXTREMITIES:  Extremities are free of edema.  NEUROLOGIC:  No focal deficit.   LABORATORY DATA:  CBC on admission:  WBC 5300 with a hemoglobin of 13.7 and  hematocrit of 41.4.  ABGs:  PH of 7.350 with a pCO2 of 35, pO2 of 80.  Chemistries on admission showed  a sodium of 139, potassium of 2.7.  Chloride  112.  CO2 26, glucose 97, BUN 13, creatinine 1, calcium 8.8.  Subsequent  chemistries on 05/19/05:  Sodium 140, potassium 4.3, chloride 111, CO2 22,  glucose 107, BUN 26, creatinine 1.2.  CPK of 54, CPK-MB 2.2, troponin 0.03.  Subsequent cardiac enzymes:  CPK of 43, CPK-MB 1.6, troponin 0.06.  myoglobin on admission 71.4 with CPK-MB of 1.7, troponin 0.05.  Electrocardiogram normal sinus rhythm.  Right bundle branch block.  Chest x-  ray:  Cardiomegaly, pulmonary congestion, stable, mild interstitial edema.   HOSPITAL COURSE:  The patient at the time of her admission was placed on a 2-  gram low sodium diet, was given intravenous Lasix 40 mg every 8 hours, nasal  O2 at 2 L/minute, supplementary potassium at 20 milliequivalents q.i.d.,  daily weights and I&O.  She is also given subcutaneous Lovenox 40 mg daily.  She was seen in consultation  by cardiology.  Her Lasix was changed to 60 mg  daily.  The patient improved symptomatically.  Cardiology was to  see her as an outpatient in followup, and follow the adjustments of  furosemide, ACE inhibitor, and beta blocker.  She is discharged on 60 mg of  Lasix daily, Altace 2.5 mg, KCl 20 milliequivalents daily.  The patient was  stable at the time of her discharge.       AGM/MEDQ  D:  06/13/2005  T:  06/13/2005  Job:  [id number]

## 2011-04-29 NOTE — Consult Note (Signed)
NAMEKATALEA, UCCI               ACCOUNT NO.:  [id number]   MEDICAL RECORD NO.:  [id number]          PATIENT TYPE:  INP   LOCATION:  A226                          FACILITY:  [facility]   PHYSICIAN:  Steelville Bing, M.D.  DATE OF BIRTH:  02/26/1936   DATE OF CONSULTATION:  05/17/2005  DATE OF DISCHARGE:                                   CONSULTATION   CONSULTING PHYSICIAN:  Wynne Bing, M.D.   REFERRING PHYSICIAN:  Angus G. Renard Matter, M.D.   PRIMARY CARDIOLOGIST:  Vida Roller, M.D.   HISTORY OF PRESENT ILLNESS:  A 75 year old woman with nonischemic  cardiomyopathy, presumably hypertensive heart disease, and congestive heart  failure admitted for recurrence.  Ms. Dicola has financial challenges and  has not been able to take her medication reliably.  In this setting, she has  been seen in the emergency department on a number of occasions with  increased respiratory distress.  After outpatient treatment, she has been  discharged to home.  On the day of admission, she presented with more severe  symptoms and blood pressure out of control prompting admission.   Cardiac catheterization in 2005, revealed insignificant LAD and circumflex  disease with an ejection fraction of 0.30 - 0.35.  Bilateral moderately  severe renal artery stenosis was also present.   The patient describes history of asthma and believes that her respiratory  difficulties are frequently due to this rather than CHF.   PRIOR SURGERIES:  1.  Hysterectomy.  2.  Cholecystectomy.   MEDICATIONS PRIOR TO ADMISSION:  Uncertain.  The patient exhausted her  supply of Lasix a week prior to admission and has not been taking much of  anything since then.   SOCIAL HISTORY:  Retired, married, and lives in [place].  Fourteen living  children.  No history of tobacco or alcohol use.   FAMILY HISTORY:  Sketchy.  No significant coronary disease.   REVIEW OF SYSTEMS:  Negative, except as noted above.   PHYSICAL  EXAMINATION:  GENERAL:  A fatigued-appearing woman in no acute  distress.  VITAL SIGNS:  The temperature is 97.1, heart rate 90 and regular,  respirations 20, blood pressure 150/105, weight 168.  HEENT:  Anicteric sclerae.  Normal lids and conjunctivae.  NECK:  Mild jugular venous distension.  Normal carotid upstrokes without  bruits.  LUNGS:  Few basilar rales.  CARDIAC:  Normal first second heart sounds.  Summation gallop present.  PMI  laterally displaced.  ABDOMEN:  Soft and nontender.  No organomegaly.  EXTREMITIES:  Distal pulses intact.  No significant edema.   Chest x-ray:  Cardiomegaly, vascular congestion, mild edema.   EKG:  Normal sinus rhythm; left atrial abnormality; right bundle branch  block; right axis deviation - possible RVH; nonspecific ST-T wave  abnormalities, somewhat more prominent inferiorly.   CBC and chemistry profile unremarkable.  BNP 762.   IMPRESSION:  Ms. Vandenberghe returns to the hospital with failure to comply with  her medical regimen, hypertension out of control, and exacerbation of  chronic congestive heart failure.   1.  We will establish an appropriate medical regimen with furosemide,  ACE      inhibitor, and beta-blocker.  2.  Additional antihypertensive can be added as needed.  3.  As soon as she is feeling better, she can be discharged with further      adjustment of medications on an outpatient basis.       RR/MEDQ  D:  05/17/2005  T:  05/18/2005  Job:  [id number]

## 2011-04-29 NOTE — Group Therapy Note (Signed)
NAME:  [name], [name]               ACCOUNT NO.:  [id number]   MEDICAL RECORD NO.:  [id number]          PATIENT TYPE:  INP   LOCATION:  A226                          FACILITY:  [facility]   PHYSICIAN:  Angus G. Renard Matter, MD   DATE OF BIRTH:  November 14, 1936   DATE OF PROCEDURE:  05/18/2005  DATE OF DISCHARGE:                                   PROGRESS NOTE   SUBJECTIVE:  This patient was admitted with congestive heart failure,  pulmonary edema.  She also had hypokalemia, was given runs of K-Ciel NAD.  She does have nonischemic cardiomyopathy as well and hypertension.   OBJECTIVE:  VITAL SIGNS:  Blood pressure 119/96, respirations 20, pulse 89,  temperature 97.9.  She has negative fluid balance of -1200 cc.  Cardiac  monitors only slightly elevated.  BNP was 762.  LUNGS:  Clear.  HEART:  Regular rhythm.  ABDOMEN:  No palpable organs or masses.  EXTREMITIES:  Minimal ankle edema.   ASSESSMENT:  Ischemic cardiomyopathy with evidence of CHF, pulmonary edema.   PLAN:  Continue current regimen.      AGM/MEDQ  D:  05/18/2005  T:  05/18/2005  Job:  [id number]

## 2011-04-29 NOTE — Group Therapy Note (Signed)
NAME:  [name], [name]                         ACCOUNT NO.:  [id number]   MEDICAL RECORD NO.:  [id number]                   PATIENT TYPE:  INP   LOCATION:  A204                                 FACILITY:  [facility]   PHYSICIAN:  Edward L. Juanetta Gosling, M.D.             DATE OF BIRTH:  1936-09-07   DATE OF PROCEDURE:  01/24/2004  DATE OF DISCHARGE:                                   PROGRESS NOTE   PROBLEM:  Congestive heart failure.   SUBJECTIVE:  [name] says that she is feeling better and has no  complaints.  She would like to go home.  She says that she is back to  baseline as far as her breathing is concerned.  She has a history of both  apparently COPD and CHF.  She denies any other complaints today - no chest  pain, no increase in shortness of breath, and no other complaints.   OBJECTIVE:  Her exam this morning shows that she does look comfortable.  Temperature is 97.7, pulse is 90, respirations 20, blood pressure 136/92, O2  saturations 95%, and she is off O2.  Her chest is very clear, weight 172.3.   ASSESSMENT:  She seems to be doing better.   PLAN:  For discharge home today; please see Discharge Summary for details.      ___________________________________________                                            Oneal Deputy Juanetta Gosling, M.D.   ELH/MEDQ  D:  01/24/2004  T:  01/24/2004  Job:  [id number]

## 2011-04-29 NOTE — Group Therapy Note (Signed)
NAME:  PAYTEN, HOBIN                         ACCOUNT NO.:  [id number]   MEDICAL RECORD NO.:  [id number]                   PATIENT TYPE:  INP   LOCATION:  IC04                                 FACILITY:  [facility]   PHYSICIAN:  Angus G. Renard Matter, M.D.              DATE OF BIRTH:  Apr 26, 1936   DATE OF PROCEDURE:  DATE OF DISCHARGE:                                   PROGRESS NOTE   This patient was admitted with congestive heart failure, associated  bronchospasm.  She continues to diurese.   OBJECTIVE:  VITAL SIGNS: Blood pressure 150/85, respirations 20, pulse 89,  temperature 97.8.  The patient has a negative fluid balance, minus 160;  seems to be feeling better.  LUNGS:  Rhonchi and rales over lower fields.  HEART:  Regular rhythm. ABDOMEN: No palpable organs or masses.   ASSESSMENT:  Patient admitted with CHF, bronchospasm.   PLAN:  Plant to continue current regimen.      ___________________________________________                                            Ishmael Holter. Renard Matter, M.D.   AGM/MEDQ  D:  01/22/2004  T:  01/22/2004  Job:  [id number]

## 2011-04-29 NOTE — Group Therapy Note (Signed)
NAME:  ELSEY, HOLTS                         ACCOUNT NO.:  [id number]   MEDICAL RECORD NO.:  [id number]                   PATIENT TYPE:  INP   LOCATION:  IC04                                 FACILITY:  [facility]   PHYSICIAN:  Angus G. Renard Matter, M.D.              DATE OF BIRTH:  09/09/1936   DATE OF PROCEDURE:  DATE OF DISCHARGE:                                   PROGRESS NOTE   SUBJECTIVE:  This patient has improved.  He has had good diuresis.  He was  admitted with congestive heart failure, bronchospasm.   OBJECTIVE:  Vital signs:  Blood pressure 127/90, respirations 27.  Pulse 85.  Temperature 98.  Lungs, rhonchi over the lower lung field.  Heart is regular  rhythm.  Abdomen, no palpable organs or masses.   ASSESSMENT:  The patient was admitted with congestive heart failure and  bronchospasm.   PLAN:  Continue current regimen.  We will attempt to move the patient out to  2-8 today.      ___________________________________________                                            Ishmael Holter. Renard Matter, M.D.   AGM/MEDQ  D:  01/23/2004  T:  01/23/2004  Job:  [id number]

## 2011-04-29 NOTE — Discharge Summary (Signed)
[name], [name]                           ACCOUNT NO.:  [id number]   MEDICAL RECORD NO.:  [id number]                  PATIENT TYPE:   LOCATION:                                       FACILITY:   PHYSICIAN:  Edward L. Juanetta Gosling, M.D.             DATE OF BIRTH:   DATE OF ADMISSION:  DATE OF DISCHARGE:                                 DISCHARGE SUMMARY   DISCHARGE DIAGNOSES:  1. Congestive heart failure.  2. Chronic obstructive pulmonary disease.  3. History of hypertension.   HISTORY OF PRESENT ILLNESS:  [name] is a 75 year old who came to the  emergency room complaining of shortness of breath.  She had been having this  for about two days, got worse on the evening of admission.  She has had a  productive cough.  She has not been able to sleep and has used albuterol  metered dose inhaler without help.  She was on Lasix but had run out.   PHYSICAL EXAMINATION:  GENERAL:  Weight 174 on admission.  VITAL SIGNS:  Temperature 97.8, pulse 89, respirations 24, blood pressure  150/85.  NECK:  Supple with jugular venous distention at 30 degrees.  CHEST:  Some rales in the bases.  HEART:  Heart had an S3.  ABDOMEN:  Soft, without masses.  EXTREMITIES:  No edema.  CNS:  Grossly intact.   LABORATORY DATA:  White count 7400, hemoglobin 14, potassium 3.  BUN 12,  creatinine 1.  Cardiac enzymes were normal.  BNP 580.  Blood gases showed  pO2-67 on O2.   HOSPITAL COURSE:  She was treated with an ARB, Xopenex, potassium  replacement and Aldactone.  By the time of discharge, she was much improved  and was discharged to home in improved condition on:   DISCHARGE MEDICATIONS:  1. Nebulizer with Xopenex and Atrovent q.8h..  2. Aspirin 81 mg daily.  3. Captopril 25 mg t.i.d.  4. Lasix 40 mg p.o. b.i.d.  5. Prednisone on a tapering dose.  6. Spironolactone 25 mg daily.  7. Potassium chloride 20 mEq b.i.d.  8. Ceftin 250 mg b.i.d. for four more days.     ___________________________________________                                         Oneal Deputy Juanetta Gosling, M.D.   ELH/MEDQ  D:  01/24/2004  T:  01/24/2004  Job:  [id number]   cc:   Angus G. Renard Matter, M.D.  [address]  [place]  [place] [address]  Fax: [phone]

## 2011-04-29 NOTE — Consult Note (Signed)
NAME:  Katie, [name]                         ACCOUNT NO.:  [id number]   MEDICAL RECORD NO.:  [id number]                   PATIENT TYPE:  INP   LOCATION:  IC04                                 FACILITY:  [facility]   PHYSICIAN:  Vida Roller, M.D.                DATE OF BIRTH:  07-01-1936   DATE OF CONSULTATION:  DATE OF DISCHARGE:                                   CONSULTATION   HISTORY OF PRESENT ILLNESS:  [name] is a 75 year old African American  female who presented to the emergency department complaining of shortness of  breath.  She reported the onset of dyspnea about two days prior with  worsening dyspnea on the evening of admission.  She described a productive  cough, was unable to sleep, had to sit up on the side of the bed to be able  to breath.  She had used her Albuterol inhaler without any relief and stated  that she had recently run out of her Lasix and felt as though she had not  had any since about Sunday with progressive increase in her lower extremity  edema.  She denies any chest pain.  She does have PND and orthopnea.  No  palpitations.   PAST MEDICAL HISTORY:  1. A diagnosis of asthma without significant PFT's.  2. Diagnosis of congestive heart failure without assessment of the LV     function.  3. History of hypertension (previously on medications for this but recently     stopped those due to normal blood pressures).  4. Unknown lipid status.  5. Hysterectomy.  6. Cholecystectomy.   SOCIAL HISTORY:  She lives in [place] with her husband.  She is retired.  She is married.  She has 14 children.  She does not smoke cigarettes.  Does  not use alcohol.  Does not use any drugs.   FAMILY HISTORY:  Mother died at age 63 of unknown causes.  Father died at  age 13 of unknown causes.  She has a sister who is healthy and has no  coronary disease.   MEDICATIONS:  1. Aspirin.  2. Lasix 40 mg daily, which she adjusted depending on whether she had fluid     in  the hospital.  3. Albuterol p.r.n.  4. Albuterol/Atrovent nebulizers q.4h.  5. Rocephin 1 g IV q.day.  6. Lasix 40 mg IV b.i.d.  7. Avapro 300 mg once daily.  8. Solu-Medrol 125 mg IV q.6h.  9. Tylenol as needed.   REVIEW OF SYSTEMS:  Significant for the symptoms described in the history of  present illness.  Of note, the remainder of her review of systems is  entirely negative.   PHYSICAL EXAMINATION:  GENERAL APPEARANCE:  She is a moderately obese  African American female who weighed 174 pounds on admission and is now down  to 171 pounds.  She was treated with IV Lasix.  VITAL SIGNS:  Temperature  97.8, pulse 89, respirations 26, blood pressure  150/85.  She is negative about 1100 cc since admission, although, it appears  it might be an underestimate.  HEENT:  Significant only for mildly poor dentition.  Otherwise,  unremarkable.  NECK:  Supple.  She does have 3 cm of JVD at 30 degrees.  No obvious bruits  are noted.  CHEST:  Bibasilar rales about a third of the way up with dullness to  percussion.  CARDIOVASCULAR:  Tachy and irregular with easily heard S3 and S4 and a  summation gallop.  SKIN:  Without significant rashes or deformities.  She does have a scar on  her chest which is old.  ABDOMEN:  Obese, soft, nontender, normoactive bowel sounds.  GU/RECTAL:  Deferred.  EXTREMITIES:  Distal pulses are 1+ throughout.  MUSCULOSKELETAL:  Unremarkable.  NEUROLOGICAL:  Unremarkable.   CHEST X-RAY:  Cardiomegaly with congestive heart failure.   EKG:  Sinus tachycardia at 113 with occasional PAC.  She has mild right axis  deviation.  Her P-R interval is normal.  QRS duration is 135 milliseconds in  a right bundle branch block configuration.  She had nonspecific ST-T wave  changes.  No old EKG's for comparison.   LABORATORY DATA:  White blood cell count 7.4, H&H 14 and 41 with platelet  count of 238,000.  Sodium 143, potassium 3.0, chloride 108, bicarbonate 27,  BUN 12,  creatinine 1.0, blood sugars 176.  Two sets of cardiac enzymes are  normal.  Her BNP was 580.  Blood gas showed a pH of 7.39, PCO2 38, PO2 67,  93% on supplemental oxygen.   ASSESSMENT:  1. Congestive heart failure.  I suspect this is probably systolic     dysfunction.  2. Hypertension which is not well controlled and likely the cause of #1.  3. Sinus tachycardia which is likely secondary to her nebulizer treatment.  4. Hypokalemia.   RECOMMENDATIONS:  Echocardiogram to assess her LV function.  Probably change  her ARB to an ACE.  I would change her nebulizers to Xopenex and decrease  the frequency.  Also, adjust her steroid dose down.  We need to replace her  electrolytes.  She probably will need more further evaluation as we get a  sense for whether she has diastolic or systolic etiology for her heart  failure.  I would replace her electrolytes relatively aggressively, and also  check a magnesium level.  She will need eyes and nose and daily weights  attention to her fluid status.      ___________________________________________                                            Vida Roller, M.D.   JH/MEDQ  D:  01/21/2004  T:  01/21/2004  Job:  [id number]

## 2011-04-29 NOTE — H&P (Signed)
NAME:  [name], [name]                         ACCOUNT NO.:  [id number]   MEDICAL RECORD NO.:  [id number]                   PATIENT TYPE:  INP   LOCATION:  IC04                                 FACILITY:  [facility]   PHYSICIAN:  Angus G. Renard Matter, M.D.              DATE OF BIRTH:  09-24-1936   DATE OF ADMISSION:  01/20/2004  DATE OF DISCHARGE:                                HISTORY & PHYSICAL   ADDENDUM   Additional diagnosis is bronchitis.     ___________________________________________                                         Ishmael Holter. Renard Matter, M.D.   AGM/MEDQ  D:  01/21/2004  T:  01/21/2004  Job:  [id number]

## 2011-04-29 NOTE — Procedures (Signed)
NAME:  [name], [name]                         ACCOUNT NO.:  [id number]   MEDICAL RECORD NO.:  [id number]                   PATIENT TYPE:  OUT   LOCATION:  RAD                                  FACILITY:  [facility]   PHYSICIAN:  Vida Roller, M.D.                DATE OF BIRTH:  12/29/1935   DATE OF PROCEDURE:  06/02/2004  DATE OF DISCHARGE:                                  ECHOCARDIOGRAM   PRIMARY CARE PHYSICIAN:  ngus G. Renard Matter, M.D.   Tape Number [id number], Tape Count 1228 through 1718.   This is a 75 year old woman with congestive heart failure.  Previously  underwent echocardiogram in February which showed severe hypokinesis of the  inferior and inferior posterior segments.  LV function at that time was  about 35%.  She had mild to moderate left ventricular hypertrophy and mild  to moderate mitral regurgitation.   Today's study is technically adequate.   M-MODE TRACINGS:  1. The aorta is 32 mm.  2. The left atrium is 35 mm.  3. The septum is 17 mm.  4. The posterior wall is 17 mm.  5. Left ventricular diastolic dimension is 39 mm.  6. Left ventricular systolic dimension is 36 mm.   2-D AND DOPPLER IMAGING:  1. The left ventricle is normal size with depressed LV function. Estimated     ejection fraction 30 to 35%.  There is evidence of inferior, inferior     posterior, inferior lateral hypokinesis. The apex also appears to not     move normally, and there appears to be hinge point right at the apex.     The anterior wall appears to move normally as does the septum.  There is     abnormal relaxation seen.  2. The right ventricle is normal size with normal systolic function.  3. Both atria appear to be normal size with no evidence of atrial septal     defect.  4. The aortic valve is sclerotic with no evidence of stenosis or     regurgitation.  5. The mitral valve has mitral annular calcification which is mild to     moderate.  There is trace regurgitation noted.  No stenosis is  seen.  6. The tricuspid valve has mild insufficiency.  7. Pulmonic valve not well seen.  8. Pericardial structures appear normal.  9. Inferior vena cava appears to be normal size.  10.      Ascending aorta not well seen.      ___________________________________________                                            Vida Roller, M.D.   JH/MEDQ  D:  06/03/2004  T:  06/03/2004  Job:  [id number]   cc:   Angus G. Renard Matter, M.D.  [address]  [place]  [place] [address]  Fax: [phone]

## 2011-09-16 LAB — CBC
HCT: 39.8
Hemoglobin: 13
MCHC: 32.7
MCV: 81.4
Platelets: 257
RBC: 4.9
RDW: 14.8
WBC: 5.7

## 2011-09-16 LAB — BASIC METABOLIC PANEL
BUN: 15
BUN: 20
BUN: 20
BUN: 34 — ABNORMAL HIGH
BUN: 46 — ABNORMAL HIGH
CO2: 25
CO2: 26
CO2: 26
CO2: 26
CO2: 27
Calcium: 9.3
Calcium: 9.3
Calcium: 9.3
Calcium: 9.4
Calcium: 9.6
Chloride: 103
Chloride: 104
Chloride: 106
Chloride: 107
Chloride: 107
Creatinine, Ser: 1.08
Creatinine, Ser: 1.13
Creatinine, Ser: 1.22 — ABNORMAL HIGH
Creatinine, Ser: 1.77 — ABNORMAL HIGH
Creatinine, Ser: 2.03 — ABNORMAL HIGH
GFR calc Af Amer: 29 — ABNORMAL LOW
GFR calc Af Amer: 35 — ABNORMAL LOW
GFR calc Af Amer: 53 — ABNORMAL LOW
GFR calc Af Amer: 58 — ABNORMAL LOW
GFR calc Af Amer: >60
GFR calc non Af Amer: 24 — ABNORMAL LOW
GFR calc non Af Amer: 29 — ABNORMAL LOW
GFR calc non Af Amer: 44 — ABNORMAL LOW
GFR calc non Af Amer: 48 — ABNORMAL LOW
GFR calc non Af Amer: 50 — ABNORMAL LOW
Glucose, Bld: 111 — ABNORMAL HIGH
Glucose, Bld: 118 — ABNORMAL HIGH
Glucose, Bld: 119 — ABNORMAL HIGH
Glucose, Bld: 126 — ABNORMAL HIGH
Glucose, Bld: 126 — ABNORMAL HIGH
Potassium: 3.7
Potassium: 3.8
Potassium: 3.8
Potassium: 3.8
Potassium: 3.9
Sodium: 137
Sodium: 138
Sodium: 140
Sodium: 141
Sodium: 141

## 2011-09-16 LAB — DIFFERENTIAL
Basophils Absolute: 0
Basophils Relative: 1
Eosinophils Absolute: 0.1 — ABNORMAL LOW
Eosinophils Relative: 2
Lymphocytes Relative: 25
Lymphs Abs: 1.4
Monocytes Absolute: 0.6
Monocytes Relative: 10
Neutro Abs: 3.6
Neutrophils Relative %: 63

## 2011-09-16 LAB — B-NATRIURETIC PEPTIDE (CONVERTED LAB): Pro B Natriuretic peptide (BNP): 229 — ABNORMAL HIGH

## 2011-09-19 LAB — BASIC METABOLIC PANEL
BUN: 12
BUN: 8
BUN: 9
CO2: 23
CO2: 26
CO2: 26
Calcium: 9.3
Calcium: 9.3
Calcium: 9.6
Chloride: 104
Chloride: 106
Chloride: 109
Creatinine, Ser: 0.98
Creatinine, Ser: 1.02
Creatinine, Ser: 1.28 — ABNORMAL HIGH
GFR calc Af Amer: 50 — ABNORMAL LOW
GFR calc Af Amer: >60
GFR calc Af Amer: >60
GFR calc non Af Amer: 41 — ABNORMAL LOW
GFR calc non Af Amer: 54 — ABNORMAL LOW
GFR calc non Af Amer: 56 — ABNORMAL LOW
Glucose, Bld: 118 — ABNORMAL HIGH
Glucose, Bld: 123 — ABNORMAL HIGH
Glucose, Bld: 143 — ABNORMAL HIGH
Potassium: 2.9 — ABNORMAL LOW
Potassium: 3.4 — ABNORMAL LOW
Potassium: 3.5
Sodium: 141
Sodium: 142
Sodium: 143

## 2011-09-19 LAB — CBC
HCT: 40.2
Hemoglobin: 13.2
MCHC: 32.8
MCV: 81.8
Platelets: 236
RBC: 4.92
RDW: 14.2
WBC: 6.2

## 2011-09-19 LAB — POCT CARDIAC MARKERS
CKMB, poc: 2.7
Myoglobin, poc: 98.3
Operator id: 208461
Troponin i, poc: <0.05

## 2011-09-19 LAB — DIFFERENTIAL
Basophils Absolute: 0
Basophils Relative: 1
Eosinophils Absolute: 0.1 — ABNORMAL LOW
Eosinophils Relative: 2
Lymphocytes Relative: 29
Lymphs Abs: 1.8
Monocytes Absolute: 0.4
Monocytes Relative: 7
Neutro Abs: 3.8
Neutrophils Relative %: 61

## 2011-09-19 LAB — CARDIAC PANEL(CRET KIN+CKTOT+MB+TROPI)
CK, MB: 2
Relative Index: INVALID
Total CK: 66
Troponin I: 0.05

## 2011-09-19 LAB — B-NATRIURETIC PEPTIDE (CONVERTED LAB): Pro B Natriuretic peptide (BNP): 524 — ABNORMAL HIGH

## 2011-09-28 ENCOUNTER — Encounter: Payer: Self-pay | Admitting: Cardiology

## 2011-10-03 ENCOUNTER — Ambulatory Visit: Payer: PRIVATE HEALTH INSURANCE | Admitting: Cardiology

## 2011-12-13 DIAGNOSIS — J111 Influenza due to unidentified influenza virus with other respiratory manifestations: Secondary | ICD-10-CM

## 2011-12-13 HISTORY — DX: Influenza due to unidentified influenza virus with other respiratory manifestations: J11.1

## 2011-12-30 ENCOUNTER — Emergency Department (HOSPITAL_COMMUNITY)
Admission: EM | Admit: 2011-12-30 | Discharge: 2011-12-30 | Disposition: A | Discharge status: Home / Self Care | Payer: PRIVATE HEALTH INSURANCE | Attending: Emergency Medicine | Admitting: Emergency Medicine

## 2011-12-30 ENCOUNTER — Encounter (HOSPITAL_COMMUNITY): Payer: Self-pay | Admitting: *Deleted

## 2011-12-30 DIAGNOSIS — Z8673 Personal history of transient ischemic attack (TIA), and cerebral infarction without residual deficits: Secondary | ICD-10-CM | POA: Insufficient documentation

## 2011-12-30 DIAGNOSIS — J45909 Unspecified asthma, uncomplicated: Secondary | ICD-10-CM | POA: Insufficient documentation

## 2011-12-30 DIAGNOSIS — Z79899 Other long term (current) drug therapy: Secondary | ICD-10-CM | POA: Insufficient documentation

## 2011-12-30 DIAGNOSIS — E039 Hypothyroidism, unspecified: Secondary | ICD-10-CM | POA: Insufficient documentation

## 2011-12-30 DIAGNOSIS — M109 Gout, unspecified: Secondary | ICD-10-CM | POA: Insufficient documentation

## 2011-12-30 DIAGNOSIS — M25476 Effusion, unspecified foot: Secondary | ICD-10-CM | POA: Insufficient documentation

## 2011-12-30 DIAGNOSIS — I779 Disorder of arteries and arterioles, unspecified: Secondary | ICD-10-CM | POA: Insufficient documentation

## 2011-12-30 DIAGNOSIS — C001 Malignant neoplasm of external lower lip: Secondary | ICD-10-CM | POA: Insufficient documentation

## 2011-12-30 DIAGNOSIS — I10 Essential (primary) hypertension: Secondary | ICD-10-CM | POA: Insufficient documentation

## 2011-12-30 DIAGNOSIS — M7989 Other specified soft tissue disorders: Secondary | ICD-10-CM | POA: Insufficient documentation

## 2011-12-30 DIAGNOSIS — M25579 Pain in unspecified ankle and joints of unspecified foot: Secondary | ICD-10-CM | POA: Insufficient documentation

## 2011-12-30 DIAGNOSIS — M25473 Effusion, unspecified ankle: Secondary | ICD-10-CM | POA: Insufficient documentation

## 2011-12-30 MED ORDER — HYDROCODONE-ACETAMINOPHEN 5-325 MG PO TABS: 2.0000 | ORAL_TABLET | ORAL | Status: AC | PRN

## 2011-12-30 MED ORDER — PREDNISONE 20 MG PO TABS: 40.0000 mg | ORAL_TABLET | Freq: Every day | ORAL | Status: DC

## 2011-12-30 MED ORDER — PREDNISONE 20 MG PO TABS
40.0000 mg | ORAL_TABLET | Freq: Once | ORAL | Status: AC
  Administered 2011-12-30: 40 mg via ORAL
  Filled 2011-12-30: qty 2

## 2011-12-30 MED ORDER — HYDROCODONE-ACETAMINOPHEN 5-325 MG PO TABS
1.0000 | ORAL_TABLET | Freq: Once | ORAL | Status: AC
  Administered 2011-12-30: 1 via ORAL
  Filled 2011-12-30: qty 1

## 2011-12-30 MED ORDER — HYDROCODONE-ACETAMINOPHEN 5-325 MG PO TABS: ORAL_TABLET | ORAL | Status: DC

## 2011-12-30 NOTE — ED Provider Notes (Signed)
History     CSN: [id number]  Arrival date & time 12/30/11  0031   First MD Initiated Contact with Patient 12/30/11 0050      Chief Complaint  Patient presents with  . Leg Swelling    (Consider location/radiation/quality/duration/timing/severity/associated sxs/prior treatment) HPI Comments: Patient reports that she developed swelling and pain to the middle of her right foot this evening. This occurred a few hours ago but otherwise she cannot be more specific. She denies any fevers chills, traumatic injury, cuts or abrasions to her foot or leg. She denies actual calf tenderness. No recent long distance travel. No pleuritic chest pain, shortness of breath or coughing. She reports that she has a history of gout in that same foot and that this feels similar to her prior episode. She reports that she was seen in the emergency department about one or 2 years ago for similar episode and was given a prescription for a single medication, but she is unable to recall what that medication was. Looking at her current home medications includes allopurinol. I did review prior ED notes through epic, however was not able to pull up any recent ED notes that I could tell that she was treated for gout. She reports that she has taken all of her usual home medications.  The history is provided by the patient and a relative.    Past Medical History  Diagnosis Date  . Nonischemic cardiomyopathy     LVEF 20-25%  . Asthma   . Cerebrovascular disease     TIA and stroke   . Hypertension   . Renal artery stenosis     Bilaterial   . CAD (coronary artery disease)     Nonobstructive at cath 2005  . Hypothyroidism   . Carotid artery disease   . Gout     Past Surgical History  Procedure Date  . Abdominal hysterectomy   . Cholecystectomy     History reviewed. No pertinent family history.  History  Substance Use Topics  . Smoking status: Never Smoker   . Smokeless tobacco: Never Used  . Alcohol Use: No      OB History    Grav Para Term Preterm Abortions TAB SAB Ect Mult Living                  Review of Systems  Constitutional: Negative.   Respiratory: Negative for cough and shortness of breath.   Cardiovascular: Positive for leg swelling. Negative for chest pain and palpitations.  Musculoskeletal: Positive for joint swelling and arthralgias.  Skin: Negative for rash and wound.  Neurological: Negative for weakness and numbness.    Allergies  Review of patient's allergies indicates no known allergies.  Home Medications   Current Outpatient Rx  Name Route Sig Dispense Refill  . ALLOPURINOL 100 MG PO TABS Oral Take 100 mg by mouth daily.      . ASPIRIN 81 MG PO TABS Oral Take 81 mg by mouth daily.      Marland Kitchen CARVEDILOL 12.5 MG PO TABS Oral Take 12.5 mg by mouth 2 (two) times daily with a meal.      . CLONIDINE HCL 0.1 MG PO TABS Oral Take 0.1 mg by mouth 2 (two) times daily.      . FUROSEMIDE 20 MG PO TABS Oral Take 20 mg by mouth 2 (two) times daily.      Marland Kitchen HYDROCODONE-ACETAMINOPHEN 5-325 MG PO TABS Oral Take 2 tablets by mouth every 4 (four) hours as needed for  pain. 6 tablet 0  . HYDROCODONE-ACETAMINOPHEN 5-325 MG PO TABS  1-2 tablets po q 6 hours prn pain 15 tablet 0  . LISINOPRIL 40 MG PO TABS Oral Take 1 tablet (40 mg total) by mouth daily. 30 tablet 6  . OLMESARTAN MEDOXOMIL 20 MG PO TABS Oral Take 20 mg by mouth daily. 1/2 tab daily     . POTASSIUM CHLORIDE CRYS ER 20 MEQ PO TBCR Oral Take 20 mEq by mouth 2 (two) times daily.      Marland Kitchen PREDNISONE 20 MG PO TABS Oral Take 2 tablets (40 mg total) by mouth daily. 12 tablet 0    BP 182/81  Pulse 82  Temp(Src) 98.8 F (37.1 C) (Oral)  Resp 20  Ht 5\' 5"  (1.651 m)  Wt 170 lb (77.111 kg)  BMI 28.29 kg/m2  SpO2 97%  Physical Exam  Nursing note and vitals reviewed. Constitutional: She appears well-developed and well-nourished.  HENT:  Head: Normocephalic.  Musculoskeletal: She exhibits tenderness. She exhibits no edema.        Right ankle: She exhibits decreased range of motion and swelling. She exhibits no ecchymosis, no deformity, no laceration and normal pulse. Achilles tendon normal.       Feet:  Neurological: She is alert.  Skin: Skin is warm. No rash noted. No pallor.  Psychiatric: She has a normal mood and affect.    ED Course  Procedures (including critical care time)  Labs Reviewed - No data to display No results found.   1. Gout       MDM  Pt's history and exam are consistent with gouty flare.  In review of prior labs, pt has had renal insuff in the past.  Last Cr in 11/11 was normal at 1.04, however in 4/11, it was 2.05.  Therefore, will avoid colchicine and instead treat with prednisone and analgesics.  Pt has no evidence of end organ failure, likely elevated BP is due to chronic history as well as acute pain.          Gavin Pound. Anjelo Pullman, MD 12/30/11 0100

## 2011-12-30 NOTE — ED Notes (Signed)
Pt c/o pain in her right foot and swelling which started yesterday. Pt states that the pain became worse tonight. Pt reports pain 10/10. Right foot warm to touch. Pt states "I think it might be my gout."

## 2011-12-30 NOTE — ED Notes (Signed)
Pt reports swelling of her feet that started yesterday & pain became worse tonight.

## 2011-12-30 NOTE — Discharge Instructions (Signed)
Gout Gout is an inflammatory condition (arthritis) caused by a buildup of uric acid crystals in the joints. Uric acid is a chemical that is normally present in the blood. Under some circumstances, uric acid can form into crystals in your joints. This causes joint redness, soreness, and swelling (inflammation). Repeat attacks are common. Over time, uric acid crystals can form into masses (tophi) near a joint, causing disfigurement. Gout is treatable and often preventable. CAUSES  The disease begins with elevated levels of uric acid in the blood. Uric acid is produced by your body when it breaks down a naturally found substance called purines. This also happens when you eat certain foods such as meats and fish. Causes of an elevated uric acid level include:  Being passed down from parent to child (heredity).   Diseases that cause increased uric acid production (obesity, psoriasis, some cancers).   Excessive alcohol use.   Diet, especially diets rich in meat and seafood.   Medicines, including certain cancer-fighting drugs (chemotherapy), diuretics, and aspirin.   Chronic kidney disease. The kidneys are no longer able to remove uric acid well.   Problems with metabolism.  Conditions strongly associated with gout include:  Obesity.   High blood pressure.   High cholesterol.   Diabetes.  Not everyone with elevated uric acid levels gets gout. It is not understood why some people get gout and others do not. Surgery, joint injury, and eating too much of certain foods are some of the factors that can lead to gout. SYMPTOMS   An attack of gout comes on quickly. It causes intense pain with redness, swelling, and warmth in a joint.   Fever can occur.   Often, only one joint is involved. Certain joints are more commonly involved:   Base of the big toe.   Knee.   Ankle.   Wrist.   Finger.  Without treatment, an attack usually goes away in a few days to weeks. Between attacks, you  usually will not have symptoms, which is different from many other forms of arthritis. DIAGNOSIS  Your caregiver will suspect gout based on your symptoms and exam. Removal of fluid from the joint (arthrocentesis) is done to check for uric acid crystals. Your caregiver will give you a medicine that numbs the area (local anesthetic) and use a needle to remove joint fluid for exam. Gout is confirmed when uric acid crystals are seen in joint fluid, using a special microscope. Sometimes, blood, urine, and X-ray tests are also used. TREATMENT  There are 2 phases to gout treatment: treating the sudden onset (acute) attack and preventing attacks (prophylaxis). Treatment of an Acute Attack  Medicines are used. These include anti-inflammatory medicines or steroid medicines.   An injection of steroid medicine into the affected joint is sometimes necessary.   The painful joint is rested. Movement can worsen the arthritis.   You may use warm or cold treatments on painful joints, depending which works best for you.   Discuss the use of coffee, vitamin C, or cherries with your caregiver. These may be helpful treatment options.  Treatment to Prevent Attacks After the acute attack subsides, your caregiver may advise prophylactic medicine. These medicines either help your kidneys eliminate uric acid from your body or decrease your uric acid production. You may need to stay on these medicines for a very long time. The early phase of treatment with prophylactic medicine can be associated with an increase in acute gout attacks. For this reason, during the first few months   of treatment, your caregiver may also advise you to take medicines usually used for acute gout treatment. Be sure you understand your caregiver's directions. You should also discuss dietary treatment with your caregiver. Certain foods such as meats and fish can increase uric acid levels. Other foods such as dairy can decrease levels. Your caregiver  can give you a list of foods to avoid. HOME CARE INSTRUCTIONS   Do not take aspirin to relieve pain. This raises uric acid levels.   Only take over-the-counter or prescription medicines for pain, discomfort, or fever as directed by your caregiver.   Rest the joint as much as possible. When in bed, keep sheets and blankets off painful areas.   Keep the affected joint raised (elevated).   Use crutches if the painful joint is in your leg.   Drink enough water and fluids to keep your urine clear or pale yellow. This helps your body get rid of uric acid. Do not drink alcoholic beverages. They slow the passage of uric acid.   Follow your caregiver's dietary instructions. Pay careful attention to the amount of protein you eat. Your daily diet should emphasize fruits, vegetables, whole grains, and fat-free or low-fat milk products.   Maintain a healthy body weight.  SEEK MEDICAL CARE IF:   You have an oral temperature above 102 F (38.9 C).   You develop diarrhea, vomiting, or any side effects from medicines.   You do not feel better in 24 hours, or you are getting worse.  SEEK IMMEDIATE MEDICAL CARE IF:   Your joint becomes suddenly more tender and you have:   Chills.   An oral temperature above 102 F (38.9 C), not controlled by medicine.  MAKE SURE YOU:   Understand these instructions.   Will watch your condition.   Will get help right away if you are not doing well or get worse.  Document Released: 11/25/2000 Document Revised: 08/10/2011 Document Reviewed: 03/08/2010 [employer] Patient Information 2012 [employer]. 

## 2012-02-28 ENCOUNTER — Ambulatory Visit: Payer: PRIVATE HEALTH INSURANCE | Admitting: Cardiology

## 2012-05-15 ENCOUNTER — Other Ambulatory Visit: Payer: Self-pay | Admitting: Cardiology

## 2012-05-18 ENCOUNTER — Ambulatory Visit: Payer: PRIVATE HEALTH INSURANCE | Admitting: Adult Health

## 2012-05-24 ENCOUNTER — Ambulatory Visit: Payer: PRIVATE HEALTH INSURANCE | Admitting: Adult Health

## 2012-06-11 ENCOUNTER — Ambulatory Visit: Payer: PRIVATE HEALTH INSURANCE | Admitting: Adult Health

## 2012-06-15 ENCOUNTER — Encounter (HOSPITAL_COMMUNITY): Payer: Self-pay | Admitting: *Deleted

## 2012-06-15 ENCOUNTER — Emergency Department (HOSPITAL_COMMUNITY)
Admission: EM | Admit: 2012-06-15 | Discharge: 2012-06-16 | Disposition: A | Discharge status: Home / Self Care | Payer: PRIVATE HEALTH INSURANCE | Attending: Emergency Medicine | Admitting: Emergency Medicine

## 2012-06-15 ENCOUNTER — Emergency Department (HOSPITAL_COMMUNITY): Payer: PRIVATE HEALTH INSURANCE

## 2012-06-15 DIAGNOSIS — Z8639 Personal history of other endocrine, nutritional and metabolic disease: Secondary | ICD-10-CM | POA: Insufficient documentation

## 2012-06-15 DIAGNOSIS — Z79899 Other long term (current) drug therapy: Secondary | ICD-10-CM | POA: Insufficient documentation

## 2012-06-15 DIAGNOSIS — I2510 Atherosclerotic heart disease of native coronary artery without angina pectoris: Secondary | ICD-10-CM | POA: Insufficient documentation

## 2012-06-15 DIAGNOSIS — E039 Hypothyroidism, unspecified: Secondary | ICD-10-CM | POA: Insufficient documentation

## 2012-06-15 DIAGNOSIS — J45909 Unspecified asthma, uncomplicated: Secondary | ICD-10-CM | POA: Insufficient documentation

## 2012-06-15 DIAGNOSIS — Z8673 Personal history of transient ischemic attack (TIA), and cerebral infarction without residual deficits: Secondary | ICD-10-CM | POA: Insufficient documentation

## 2012-06-15 DIAGNOSIS — Z862 Personal history of diseases of the blood and blood-forming organs and certain disorders involving the immune mechanism: Secondary | ICD-10-CM | POA: Insufficient documentation

## 2012-06-15 DIAGNOSIS — I10 Essential (primary) hypertension: Secondary | ICD-10-CM | POA: Insufficient documentation

## 2012-06-15 DIAGNOSIS — J029 Acute pharyngitis, unspecified: Secondary | ICD-10-CM | POA: Insufficient documentation

## 2012-06-15 DIAGNOSIS — R059 Cough, unspecified: Secondary | ICD-10-CM | POA: Insufficient documentation

## 2012-06-15 DIAGNOSIS — R05 Cough: Secondary | ICD-10-CM

## 2012-06-15 DIAGNOSIS — J3489 Other specified disorders of nose and nasal sinuses: Secondary | ICD-10-CM | POA: Insufficient documentation

## 2012-06-15 LAB — RAPID STREP SCREEN (MED CTR MEBANE ONLY): Streptococcus, Group A Screen (Direct): NEGATIVE

## 2012-06-15 MED ORDER — ACETAMINOPHEN 325 MG PO TABS
650.0000 mg | ORAL_TABLET | Freq: Once | ORAL | Status: AC
  Administered 2012-06-15: 650 mg via ORAL
  Filled 2012-06-15: qty 2

## 2012-06-15 MED ORDER — ALBUTEROL SULFATE HFA 108 (90 BASE) MCG/ACT IN AERS
1.0000 | INHALATION_SPRAY | Freq: Once | RESPIRATORY_TRACT | Status: AC
  Administered 2012-06-15: 2 via RESPIRATORY_TRACT
  Filled 2012-06-15: qty 6.7

## 2012-06-15 NOTE — ED Notes (Addendum)
C/o sore throat, fever, cough that started tonight, cough is non productive. Pt also c/o pain to upper right side,

## 2012-06-15 NOTE — ED Notes (Signed)
Patient given ice water per doctor's request.

## 2012-06-15 NOTE — ED Notes (Signed)
Contacted respiratory and notified of order for inhaler.

## 2012-06-15 NOTE — ED Provider Notes (Signed)
History     CSN: [id number]  Arrival date & time 06/15/12  2200   First MD Initiated Contact with Patient 06/15/12 2236      Chief Complaint  Patient presents with  . Sore Throat  . Cough    (Consider location/radiation/quality/duration/timing/severity/associated sxs/prior treatment) Patient is a 77 y.o. female presenting with pharyngitis and cough. The history is provided by the patient.  Sore Throat This is a new problem. The current episode started yesterday. The problem occurs constantly. The problem has been unchanged. Associated symptoms include congestion, coughing and a sore throat. Pertinent negatives include no abdominal pain, anorexia, arthralgias, chest pain, chills, diaphoresis, fever, headaches, myalgias, nausea, neck pain, numbness, rash, swollen glands, urinary symptoms, vertigo, visual change, vomiting or weakness. The symptoms are aggravated by swallowing. She has tried nothing for the symptoms. The treatment provided no relief.  Cough This is a new problem. The current episode started 6 to 12 hours ago. The problem occurs every few minutes. The problem has not changed since onset.The cough is productive of sputum. There has been no fever. Associated symptoms include sore throat. Pertinent negatives include no chest pain, no chills, no sweats, no ear pain, no headaches, no rhinorrhea, no myalgias, no shortness of breath and no wheezing. She has tried nothing for the symptoms. The treatment provided no relief. She is not a smoker. Her past medical history does not include pneumonia, COPD or asthma.    Past Medical History  Diagnosis Date  . Nonischemic cardiomyopathy     LVEF 20-25%  . Asthma   . Cerebrovascular disease     TIA and stroke   . Hypertension   . Renal artery stenosis     Bilaterial   . CAD (coronary artery disease)     Nonobstructive at cath 2005  . Hypothyroidism   . Carotid artery disease   . Gout     Past Surgical History  Procedure Date  .  Abdominal hysterectomy   . Cholecystectomy     No family history on file.  History  Substance Use Topics  . Smoking status: Never Smoker   . Smokeless tobacco: Never Used  . Alcohol Use: No    OB History    Grav Para Term Preterm Abortions TAB SAB Ect Mult Living                  Review of Systems  Constitutional: Negative for fever, chills, diaphoresis, activity change and appetite change.  HENT: Positive for congestion and sore throat. Negative for ear pain, facial swelling, rhinorrhea, trouble swallowing, neck pain and neck stiffness.   Eyes: Negative for visual disturbance.  Respiratory: Positive for cough. Negative for chest tightness, shortness of breath, wheezing and stridor.   Cardiovascular: Negative for chest pain, palpitations and leg swelling.  Gastrointestinal: Negative for nausea, vomiting, abdominal pain, abdominal distention and anorexia.  Musculoskeletal: Negative for myalgias and arthralgias.  Skin: Negative.  Negative for rash.  Neurological: Negative for dizziness, vertigo, syncope, weakness, numbness and headaches.  Hematological: Negative for adenopathy.  Psychiatric/Behavioral: Negative for confusion.  All other systems reviewed and are negative.    Allergies  Review of patient's allergies indicates no known allergies.  Home Medications   Current Outpatient Rx  Name Route Sig Dispense Refill  . ALLOPURINOL 100 MG PO TABS Oral Take 100 mg by mouth daily.      Marland Kitchen CARVEDILOL 12.5 MG PO TABS Oral Take 12.5 mg by mouth 2 (two) times daily with a meal.      .  CLONIDINE HCL 0.1 MG PO TABS Oral Take 0.1 mg by mouth 2 (two) times daily.      . FUROSEMIDE 20 MG PO TABS Oral Take 20 mg by mouth 2 (two) times daily.      Marland Kitchen HYDROCODONE-ACETAMINOPHEN 5-325 MG PO TABS  1-2 tablets po q 6 hours prn pain 15 tablet 0  . LISINOPRIL 40 MG PO TABS  TAKE 1 TABLET BY MOUTH EVERY DAY 30 tablet 0  . OLMESARTAN MEDOXOMIL 20 MG PO TABS Oral Take 20 mg by mouth daily. 1/2  tab daily     . POTASSIUM CHLORIDE CRYS ER 20 MEQ PO TBCR Oral Take 20 mEq by mouth 2 (two) times daily.      Marland Kitchen PREDNISONE 20 MG PO TABS Oral Take 2 tablets (40 mg total) by mouth daily. 12 tablet 0    BP 184/92  Pulse 95  Temp 99.2 F (37.3 C)  Resp 20  SpO2 96%  Physical Exam  Nursing note and vitals reviewed. Constitutional: She is oriented to person, place, and time. She appears well-developed and well-nourished. No distress.  HENT:  Head: Normocephalic and atraumatic. No trismus in the jaw.  Right Ear: Tympanic membrane and ear canal normal.  Left Ear: Tympanic membrane and ear canal normal.  Mouth/Throat: Uvula is midline and mucous membranes are normal. No uvula swelling. Posterior oropharyngeal erythema present. No oropharyngeal exudate, posterior oropharyngeal edema or tonsillar abscesses.  Neck: Normal range of motion. Neck supple.  Cardiovascular: Normal rate, regular rhythm, normal heart sounds and intact distal pulses.   No murmur heard. Pulmonary/Chest: Effort normal and breath sounds normal. She has no wheezes. She has no rales. She exhibits no tenderness.  Abdominal: Soft. Bowel sounds are normal. She exhibits no distension and no mass. There is no tenderness. There is no rebound and no guarding.  Musculoskeletal: Normal range of motion. She exhibits no edema.  Lymphadenopathy:    She has no cervical adenopathy.  Neurological: She is alert and oriented to person, place, and time. She exhibits normal muscle tone. Coordination normal.  Skin: Skin is warm and dry.    ED Course  Procedures (including critical care time)  Results for orders placed during the hospital encounter of 06/15/12  RAPID STREP SCREEN      Component Value Range   Streptococcus, Group A Screen (Direct) NEGATIVE  NEGATIVE    Dg Chest 2 View  06/15/2012  *RADIOLOGY REPORT*  Clinical Data: Cough.  CHEST - 2 VIEW  Comparison: Chest radiograph performed 10/14/2010  Findings: The lungs are  well-aerated and clear.  There is no evidence of focal opacification, pleural effusion or pneumothorax. Pulmonary vascularity is at the upper limits of normal.  The heart is borderline enlarged.  No acute osseous abnormalities are seen.  Clips are noted within the right upper quadrant, reflecting prior cholecystectomy.  IMPRESSION: No acute cardiopulmonary process seen; borderline cardiomegaly.  Original Report Authenticated By: Tonia Ghent, M.D.        MDM    Vitals reviewed.  No tachycardia or hypoxia.  Actively coughing with sputum production.  Non-toxic appearing, mucous membranes are moist.   Pt hx and care plan discussed with EDP.     Given tylenol and albuterol, 2 puffs here, dispensed remaining inhaler for home use.    Patient is feeling better.  Has drank fluids.  Agrees to close follow-up with her PMD, Dr. Renard Matter, for recheck.   Patient / Family / Caregiver understand and agree with initial ED impression and  plan with expectations set for ED visit. Pt stable in ED with no significant deterioration in condition. Pt feels improved after observation and/or treatment in ED.    The patient appears reasonably screened and/or stabilized for discharge and I doubt any other medical condition or other [facility] requiring further screening, evaluation, or treatment in the ED at this time prior to discharge.     Jaskaran Dauzat L. [place], [place] 06/19/12 2232

## 2012-06-16 MED ORDER — GUAIFENESIN-CODEINE 100-10 MG/5ML PO SYRP: 10.0000 mL | ORAL_SOLUTION | Freq: Three times a day (TID) | ORAL | Status: AC | PRN

## 2012-06-20 NOTE — ED Provider Notes (Signed)
Medical screening examination/treatment/procedure(s) were performed by non-physician practitioner and as supervising physician I was immediately available for consultation/collaboration.   Chen Holzman M Lanetra Hartley, DO 06/20/12 1251 

## 2012-07-10 ENCOUNTER — Encounter: Payer: Self-pay | Admitting: Adult Health

## 2012-07-10 ENCOUNTER — Ambulatory Visit (INDEPENDENT_AMBULATORY_CARE_PROVIDER_SITE_OTHER): Payer: PRIVATE HEALTH INSURANCE | Admitting: Adult Health

## 2012-07-10 VITALS — BP 150/90 | HR 65 | Ht 63.0 in | Wt 179.8 lb

## 2012-07-10 DIAGNOSIS — I428 Other cardiomyopathies: Secondary | ICD-10-CM

## 2012-07-10 DIAGNOSIS — I10 Essential (primary) hypertension: Secondary | ICD-10-CM

## 2012-07-10 DIAGNOSIS — I509 Heart failure, unspecified: Secondary | ICD-10-CM

## 2012-07-10 DIAGNOSIS — E785 Hyperlipidemia, unspecified: Secondary | ICD-10-CM

## 2012-07-10 MED ORDER — CARVEDILOL 25 MG PO TABS: 25.0000 mg | ORAL_TABLET | Freq: Two times a day (BID) | ORAL | Status: DC

## 2012-07-10 NOTE — Assessment & Plan Note (Signed)
Will repeat echocardiogram, as it has not been completed since 2011. This will assist in medication management. It is hopeful that her ejection fraction has normalized. We will increase her Coreg to 25 mg twice a day secondary to continued hypertension. This has been explained to her with a new prescription provided.

## 2012-07-10 NOTE — Patient Instructions (Addendum)
Start Crestor 5mg  daily (Samples given).  Increase your Coreg to 25mg  twice daily.  Make sure you are taking your Potassium twice daily.  Your physician has requested that you have an echocardiogram in the next 2-3 weeks. Echocardiography is a painless test that uses sound waves to create images of your heart. It provides your doctor with information about the size and shape of your heart and how well your heart's chambers and valves are working. This procedure takes approximately one hour. There are no restrictions for this procedure.  Your physician recommends that you schedule a follow-up appointment with Dr. Diona Browner in 1 month.

## 2012-07-10 NOTE — Addendum Note (Signed)
Addended by: Waymon Budge on: 07/10/2012 01:14 PM   Modules accepted: Orders

## 2012-07-10 NOTE — Assessment & Plan Note (Signed)
Review of medications does not demonstrate that she is on a statin. Review of allergies has not showed that she is allergic to statins. Labs completed on 06/19/2012 revealing a cholesterol of 220, triglycerides of 195, and LDL of 133. We will give her samples of Crestor 5 mg 1 by mouth daily. If she tolerates this will provide a prescription. She will have followup labs after being seen by Dr. Diona Browner in one month.

## 2012-07-10 NOTE — Assessment & Plan Note (Addendum)
Coreg has been increased to 25 mg twice a day. She will followup with Dr. Diona Browner for discussion of echocardiogram and blood pressure evaluation on higher dose. She is to call his for any issues or problems associated with increased medication dosage. It is noted on review of labs her potassium is low normal at 3.4. She is only taking potassium once a day but he is prescribed a twice a day dose. I have reminded her that she needs to take it as directed. She is also counseled on a low sodium diet.

## 2012-07-10 NOTE — Progress Notes (Signed)
HPI [name] is a 76 year old patient of Dr. Diona Browner who we are following after one year for ongoing assessment and treatment of nonischemic CM. Most recent echocardiogram recorded in 2011 revealed an EF of 20-25%. When last seen by Dr. Diona Browner she was having New York Heart Association class 2-3 dyspnea on exertion. She was also having episodes of profound fatigue. Medications were adjusted concerning timing of administration. She has been seen in the hospital ER for episode of bronchitis since being seen last. She has not had any new diagnoses.  She is without complaint today. Denies increased dyspnea on exertion and actually states she is feeling better. She has not been adhering to a low salt diet however. She also is only taking her potassium once a day instead of twice a day. Review of recent labs dated 06/19/2002 demonstrates a potassium of 3.4. Creatinine 0.74. No Known Allergies  Current Outpatient Prescriptions  Medication Sig Dispense Refill  . allopurinol (ZYLOPRIM) 100 MG tablet Take 100 mg by mouth daily.        . carvedilol (COREG) 12.5 MG tablet Take 12.5 mg by mouth 2 (two) times daily with a meal.        . cloNIDine (CATAPRES) 0.1 MG tablet Take 0.1 mg by mouth 2 (two) times daily.        . furosemide (LASIX) 20 MG tablet Take 20 mg by mouth 2 (two) times daily.        Marland Kitchen HYDROcodone-acetaminophen (NORCO) 5-325 MG per tablet 1-2 tablets po q 6 hours prn pain  15 tablet  0  . lisinopril (PRINIVIL,ZESTRIL) 40 MG tablet TAKE 1 TABLET BY MOUTH EVERY DAY  30 tablet  0  . olmesartan (BENICAR) 20 MG tablet Take 20 mg by mouth daily. 1/2 tab daily       . potassium chloride SA (K-DUR,KLOR-CON) 20 MEQ tablet Take 20 mEq by mouth 2 (two) times daily.        . predniSONE (DELTASONE) 20 MG tablet Take 2 tablets (40 mg total) by mouth daily.  12 tablet  0    Past Medical History  Diagnosis Date  . Nonischemic cardiomyopathy     LVEF 20-25%  . Asthma   . Cerebrovascular disease    TIA and stroke   . Hypertension   . Renal artery stenosis     Bilaterial   . CAD (coronary artery disease)     Nonobstructive at cath 2005  . Hypothyroidism   . Carotid artery disease   . Gout     Past Surgical History  Procedure Date  . Abdominal hysterectomy   . Cholecystectomy     RUE:[id number] of systems complete and found to be negative unless listed above  PHYSICAL EXAM BP 150/90  Pulse 65  Ht 5\' 3"  (1.6 m)  Wt 179 lb 12 oz (81.534 kg)  BMI 31.84 kg/m2   General: Well developed, well nourished, in no acute distress Head: Eyes PERRLA, No xanthomas.   Normal cephalic and atramatic  Lungs: Clear bilaterally to auscultation and percussion. Heart: HRRR S1 S2, occasional extra systole. 1/6 systolic murmur.Pulses are 2+ & equal.            Soft left sided  carotid bruit. No JVD.  No abdominal bruits. No femoral bruits. Abdomen: Bowel sounds are positive, abdomen soft and non-tender without masses or                  Hernia's noted. Msk:  Back normal, normal gait. Normal  strength and tone for age. Extremities: No clubbing, cyanosis or edema.  DP +1 Neuro: Alert and oriented X 3. Psych:  Good affect, responds appropriately EKG: NSR with RBBB rate of 65 bpm.  ASSESSMENT AND PLAN

## 2012-07-23 ENCOUNTER — Ambulatory Visit (HOSPITAL_COMMUNITY)
Admission: RE | Admit: 2012-07-23 | Discharge: 2012-07-23 | Disposition: A | Discharge status: Home / Self Care | Payer: PRIVATE HEALTH INSURANCE | Source: Ambulatory Visit | Attending: Adult Health | Admitting: Adult Health

## 2012-07-23 DIAGNOSIS — E785 Hyperlipidemia, unspecified: Secondary | ICD-10-CM | POA: Insufficient documentation

## 2012-07-23 DIAGNOSIS — I10 Essential (primary) hypertension: Secondary | ICD-10-CM | POA: Insufficient documentation

## 2012-07-23 DIAGNOSIS — I059 Rheumatic mitral valve disease, unspecified: Secondary | ICD-10-CM

## 2012-07-23 DIAGNOSIS — I2510 Atherosclerotic heart disease of native coronary artery without angina pectoris: Secondary | ICD-10-CM | POA: Insufficient documentation

## 2012-07-23 DIAGNOSIS — I509 Heart failure, unspecified: Secondary | ICD-10-CM

## 2012-07-23 NOTE — Progress Notes (Signed)
*  PRELIMINARY RESULTS* Echocardiogram 2D Echocardiogram has been performed.  Caswell Corwin 07/23/2012, 12:02 PM

## 2012-08-10 ENCOUNTER — Ambulatory Visit: Payer: PRIVATE HEALTH INSURANCE | Admitting: Cardiology

## 2012-09-04 ENCOUNTER — Other Ambulatory Visit: Payer: Self-pay | Admitting: Cardiology

## 2012-09-07 ENCOUNTER — Ambulatory Visit (INDEPENDENT_AMBULATORY_CARE_PROVIDER_SITE_OTHER): Payer: PRIVATE HEALTH INSURANCE | Admitting: Cardiology

## 2012-09-07 ENCOUNTER — Encounter: Payer: Self-pay | Admitting: Cardiology

## 2012-09-07 ENCOUNTER — Encounter: Payer: Self-pay | Admitting: *Deleted

## 2012-09-07 VITALS — BP 166/100 | HR 72 | Ht 66.0 in | Wt 179.1 lb

## 2012-09-07 DIAGNOSIS — I10 Essential (primary) hypertension: Secondary | ICD-10-CM

## 2012-09-07 DIAGNOSIS — E785 Hyperlipidemia, unspecified: Secondary | ICD-10-CM

## 2012-09-07 DIAGNOSIS — I428 Other cardiomyopathies: Secondary | ICD-10-CM

## 2012-09-07 MED ORDER — LISINOPRIL-HYDROCHLOROTHIAZIDE 20-12.5 MG PO TABS: 1.0000 | ORAL_TABLET | Freq: Two times a day (BID) | ORAL | Status: DC

## 2012-09-07 MED ORDER — SIMVASTATIN 20 MG PO TABS: 20.0000 mg | ORAL_TABLET | Freq: Every day | ORAL | Status: DC

## 2012-09-07 NOTE — Assessment & Plan Note (Signed)
Will start simvastatin generic and check FLP in 2 months.

## 2012-09-07 NOTE — Assessment & Plan Note (Signed)
Not well controlled. Medications being adjusted as noted above.

## 2012-09-07 NOTE — Patient Instructions (Addendum)
Your physician recommends that you schedule a follow-up appointment in: 2 month  Your physician has recommended you make the following change in your medication:  1 - Finish your current bottle of lisinopril  2 - When you have finished your current bottle of lisinopril,  Start on Lisinopril/HCTZ 20/12.5 twice daily 3 - START Simvastatin 20 mg daily for your cholesterol  Your physician recommends that you return for lab work in: 2 months (You will receive a letter)

## 2012-09-07 NOTE — Progress Notes (Signed)
   Clinical Summary [name] is a 76 y.o.female presenting for followup. She was seen in July by our NP. Followup echocardiogram reviewed showing LVEF up to 40-45% range, mild mitral regurgitation. Medication adjustments noted. In reviewed her medications with her today, it is clear that she does not know them well, and after call to pharmacy, it appears that she is only taking Coreg and Lisinopril. She did take the Crestor as well and states she tolerated it.  We discussed medication adjustments and prescriptions were completed.  She states that her breathing has been stable.   No Known Allergies  Current Outpatient Prescriptions  Medication Sig Dispense Refill  . carvedilol (COREG) 25 MG tablet Take 1 tablet (25 mg total) by mouth 2 (two) times daily with a meal.  90 tablet  3  . lisinopril-hydrochlorothiazide (PRINZIDE,ZESTORETIC) 20-12.5 MG per tablet Take 1 tablet by mouth 2 (two) times daily.  60 tablet  11  . simvastatin (ZOCOR) 20 MG tablet Take 1 tablet (20 mg total) by mouth at bedtime.  30 tablet  11    Past Medical History  Diagnosis Date  . Nonischemic cardiomyopathy     LVEF 20-25%  . Asthma   . Cerebrovascular disease     TIA and stroke   . Hypertension   . Renal artery stenosis     Bilaterial   . CAD (coronary artery disease)     Nonobstructive at cath 2005  . Hypothyroidism   . Carotid artery disease   . Gout     Social History [name] reports that she has never smoked. She has never used smokeless tobacco. [name] reports that she does not drink alcohol.  Review of Systems No palpitations or syncope. Occasional mild leg edema.  Physical Examination Filed Vitals:   09/07/12 1259  BP: 166/100  Pulse: 72   Filed Weights   09/07/12 1259  Weight: 179 lb 1.9 oz (81.248 kg)   Patient in NAD. HEENT: Conjunctiva and lids normal, oropharynx clear Neck: Supple, normal JVP, no carotid bruits, no thyromegaly. Lungs: Clear to auscultation, nonlabored  breathing at rest. Cardiac: Regular rate and rhythm, no S3, soft systolic murmur, no pericardial rub. Abdomen: Soft, nontender, bowel sounds present, no guarding or rebound. Extremities: Trace edema, distal pulses 2+. Skin: Warm and dry. Musculoskeletal: No kyphosis. Neuropsychiatric: Alert and oriented x3, affect grossly appropriate.    Problem List and Plan   CARDIOMYOPATHY Nonischemic, LVEF now 40-45%. Will continue Coreg at current dose and change to Lisinopril HCTZ 20/25 mg BID. Check BMET at followup.  HYPERLIPIDEMIA Will start simvastatin generic and check FLP in 2 months.  ESSENTIAL HYPERTENSION, BENIGN Not well controlled. Medications being adjusted as noted above.    Jonelle Sidle, M.D., F.A.C.C.

## 2012-09-07 NOTE — Addendum Note (Signed)
Addended by: Reather Laurence A on: 09/07/2012 01:50 PM   Modules accepted: Orders

## 2012-09-07 NOTE — Assessment & Plan Note (Signed)
Nonischemic, LVEF now 40-45%. Will continue Coreg at current dose and change to Lisinopril HCTZ 20/25 mg BID. Check BMET at followup.

## 2012-09-14 ENCOUNTER — Telehealth: Payer: Self-pay | Admitting: Cardiology

## 2012-09-14 NOTE — Telephone Encounter (Signed)
Medication directions clarified with granddaughter.  Verbalized understanding.

## 2012-09-14 NOTE — Telephone Encounter (Signed)
PT NEVER GOT LISINOPRIL 40 MG CALLED IN ONLY THE LISINOPRIL HCTZ WAS CALLED IN.

## 2012-11-07 ENCOUNTER — Encounter: Payer: Self-pay | Admitting: Cardiology

## 2012-11-07 ENCOUNTER — Encounter: Payer: PRIVATE HEALTH INSURANCE | Admitting: Cardiology

## 2012-11-07 ENCOUNTER — Other Ambulatory Visit: Payer: Self-pay | Admitting: Cardiology

## 2012-11-07 ENCOUNTER — Ambulatory Visit: Payer: PRIVATE HEALTH INSURANCE | Admitting: Cardiology

## 2012-11-07 NOTE — Progress Notes (Signed)
No show  This encounter was created in error - please disregard.

## 2012-11-08 LAB — LIPID PANEL
Cholesterol: 149 mg/dL (ref 0–200)
HDL: 33 mg/dL — ABNORMAL LOW (ref >39)
LDL Cholesterol: 76 mg/dL (ref 0–99)
Total CHOL/HDL Ratio: 4.5 Ratio
Triglycerides: 202 mg/dL — ABNORMAL HIGH (ref <150)
VLDL: 40 mg/dL (ref 0–40)

## 2012-11-12 ENCOUNTER — Encounter: Payer: Self-pay | Admitting: *Deleted

## 2012-12-08 ENCOUNTER — Encounter (HOSPITAL_COMMUNITY): Payer: Self-pay

## 2012-12-08 ENCOUNTER — Emergency Department (HOSPITAL_COMMUNITY)
Admission: EM | Admit: 2012-12-08 | Discharge: 2012-12-08 | Disposition: A | Discharge status: Home / Self Care | Payer: PRIVATE HEALTH INSURANCE | Attending: Emergency Medicine | Admitting: Emergency Medicine

## 2012-12-08 DIAGNOSIS — R05 Cough: Secondary | ICD-10-CM | POA: Insufficient documentation

## 2012-12-08 DIAGNOSIS — J45909 Unspecified asthma, uncomplicated: Secondary | ICD-10-CM | POA: Insufficient documentation

## 2012-12-08 DIAGNOSIS — R109 Unspecified abdominal pain: Secondary | ICD-10-CM | POA: Insufficient documentation

## 2012-12-08 DIAGNOSIS — Z8679 Personal history of other diseases of the circulatory system: Secondary | ICD-10-CM | POA: Insufficient documentation

## 2012-12-08 DIAGNOSIS — E039 Hypothyroidism, unspecified: Secondary | ICD-10-CM | POA: Insufficient documentation

## 2012-12-08 DIAGNOSIS — Z79899 Other long term (current) drug therapy: Secondary | ICD-10-CM | POA: Insufficient documentation

## 2012-12-08 DIAGNOSIS — Z8673 Personal history of transient ischemic attack (TIA), and cerebral infarction without residual deficits: Secondary | ICD-10-CM | POA: Insufficient documentation

## 2012-12-08 DIAGNOSIS — I10 Essential (primary) hypertension: Secondary | ICD-10-CM | POA: Insufficient documentation

## 2012-12-08 DIAGNOSIS — R059 Cough, unspecified: Secondary | ICD-10-CM | POA: Insufficient documentation

## 2012-12-08 DIAGNOSIS — I2510 Atherosclerotic heart disease of native coronary artery without angina pectoris: Secondary | ICD-10-CM | POA: Insufficient documentation

## 2012-12-08 DIAGNOSIS — R110 Nausea: Secondary | ICD-10-CM | POA: Insufficient documentation

## 2012-12-08 DIAGNOSIS — R5381 Other malaise: Secondary | ICD-10-CM | POA: Insufficient documentation

## 2012-12-08 DIAGNOSIS — M109 Gout, unspecified: Secondary | ICD-10-CM | POA: Insufficient documentation

## 2012-12-08 DIAGNOSIS — B338 Other specified viral diseases: Secondary | ICD-10-CM | POA: Insufficient documentation

## 2012-12-08 DIAGNOSIS — B349 Viral infection, unspecified: Secondary | ICD-10-CM

## 2012-12-08 DIAGNOSIS — R197 Diarrhea, unspecified: Secondary | ICD-10-CM | POA: Insufficient documentation

## 2012-12-08 DIAGNOSIS — R5383 Other fatigue: Secondary | ICD-10-CM | POA: Insufficient documentation

## 2012-12-08 MED ORDER — ONDANSETRON HCL 4 MG PO TABS
4.0000 mg | ORAL_TABLET | Freq: Once | ORAL | Status: AC
  Administered 2012-12-08: 4 mg via ORAL
  Filled 2012-12-08: qty 1

## 2012-12-08 MED ORDER — DIPHENOXYLATE-ATROPINE 2.5-0.025 MG PO TABS
1.0000 | ORAL_TABLET | Freq: Once | ORAL | Status: AC
  Administered 2012-12-08: 1 via ORAL
  Filled 2012-12-08: qty 1

## 2012-12-08 MED ORDER — DIPHENOXYLATE-ATROPINE 2.5-0.025 MG PO TABS: 1.0000 | ORAL_TABLET | Freq: Four times a day (QID) | ORAL | Status: DC | PRN

## 2012-12-08 MED ORDER — PROMETHAZINE HCL 12.5 MG PO TABS
25.0000 mg | ORAL_TABLET | Freq: Once | ORAL | Status: AC
  Administered 2012-12-08: 25 mg via ORAL
  Filled 2012-12-08: qty 2

## 2012-12-08 MED ORDER — PROMETHAZINE HCL 25 MG PO TABS: 25.0000 mg | ORAL_TABLET | Freq: Four times a day (QID) | ORAL | Status: DC | PRN

## 2012-12-08 NOTE — ED Provider Notes (Signed)
History   Scribed for Donnetta Hutching, MD, the patient was seen in room APA12/APA12. This chart was scribed by Candelaria Stagers. The patient's care started at 3:18 PM   CSN: [id number]  Arrival date & time 12/08/12  1444   First MD Initiated Contact with Patient 12/08/12 1505      Chief Complaint  Patient presents with  . Influenza     The history is provided by the patient. No language interpreter was used.   [name] is a 76 y.o. female who presents to the Emergency Department complaining of flu like sx that started last night.  Pt reports she is experiencing RUQ and LUQ pain, productive cough, and diarrhea.  She has had ill contacts.  Nothing seems to make the sx better or worse.      Past Medical History  Diagnosis Date  . Nonischemic cardiomyopathy     LVEF 20-25%  . Asthma   . Cerebrovascular disease     TIA and stroke   . Hypertension   . Renal artery stenosis     Bilaterial   . CAD (coronary artery disease)     Nonobstructive at cath 2005  . Hypothyroidism   . Carotid artery disease   . Gout     Past Surgical History  Procedure Date  . Abdominal hysterectomy   . Cholecystectomy     No family history on file.  History  Substance Use Topics  . Smoking status: Never Smoker   . Smokeless tobacco: Never Used  . Alcohol Use: No    OB History    Grav Para Term Preterm Abortions TAB SAB Ect Mult Living                  Review of Systems  Constitutional: Positive for fatigue.  Respiratory: Positive for cough.   Gastrointestinal: Positive for nausea and diarrhea.  All other systems reviewed and are negative.    Allergies  Review of patient's allergies indicates no known allergies.  Home Medications   Current Outpatient Rx  Name  Route  Sig  Dispense  Refill  . CARVEDILOL 25 MG PO TABS   Oral   Take 1 tablet (25 mg total) by mouth 2 (two) times daily with a meal.   90 tablet   3   . LISINOPRIL-HYDROCHLOROTHIAZIDE 20-12.5 MG PO TABS    Oral   Take 1 tablet by mouth 2 (two) times daily.   60 tablet   11   . SIMVASTATIN 20 MG PO TABS   Oral   Take 1 tablet (20 mg total) by mouth at bedtime.   30 tablet   11     BP 121/86  Pulse 98  Temp 100.2 F (37.9 C) (Oral)  Resp 18  Ht 5\' 5"  (1.651 m)  Wt 181 lb 4 oz (82.214 kg)  BMI 30.16 kg/m2  SpO2 96%  Physical Exam  Nursing note and vitals reviewed. Constitutional: She is oriented to person, place, and time. She appears well-developed and well-nourished. No distress.  HENT:  Head: Normocephalic and atraumatic.  Eyes: Pupils are equal, round, and reactive to light.  Neck: Normal range of motion. Neck supple.  Pulmonary/Chest: Effort normal. She has no wheezes. She has no rales.  Abdominal: There is tenderness.  Neurological: She is alert and oriented to person, place, and time.  Skin: Skin is warm and dry. She is not diaphoretic.  Psychiatric: She has a normal mood and affect. Her behavior is normal.  ED Course  Procedures   DIAGNOSTIC STUDIES: Oxygen Saturation is 96% on room air, normal by my interpretation.    COORDINATION OF CARE:     Labs Reviewed - No data to display No results found.   No diagnosis found.    MDM  History and physical consistent with viral syndrome.  Patient is nontoxic. Rx Lomotil #20 and Phenergan 25 mg #15  I personally performed the services described in this documentation, which was scribed in my presence. The recorded information has been reviewed and is accurate.        Donnetta Hutching, MD 12/08/12 [phone]

## 2012-12-08 NOTE — ED Notes (Signed)
Pt reports being sick w/ flu -like symptoms since last night.

## 2012-12-08 NOTE — Discharge Instructions (Signed)
Fluids. Medication for diarrhea and vomiting. Tylenol for fever

## 2012-12-10 ENCOUNTER — Encounter: Payer: Self-pay | Admitting: Adult Health

## 2012-12-10 ENCOUNTER — Ambulatory Visit (INDEPENDENT_AMBULATORY_CARE_PROVIDER_SITE_OTHER): Payer: PRIVATE HEALTH INSURANCE | Admitting: Adult Health

## 2012-12-10 VITALS — BP 126/78 | HR 88 | Ht 65.0 in | Wt 183.1 lb

## 2012-12-10 DIAGNOSIS — I10 Essential (primary) hypertension: Secondary | ICD-10-CM

## 2012-12-10 DIAGNOSIS — R05 Cough: Secondary | ICD-10-CM

## 2012-12-10 DIAGNOSIS — I428 Other cardiomyopathies: Secondary | ICD-10-CM

## 2012-12-10 DIAGNOSIS — R059 Cough, unspecified: Secondary | ICD-10-CM

## 2012-12-10 DIAGNOSIS — J111 Influenza due to unidentified influenza virus with other respiratory manifestations: Secondary | ICD-10-CM

## 2012-12-10 LAB — CBC
HCT: 37.7 % (ref 36.0–46.0)
Hemoglobin: 12.6 g/dL (ref 12.0–15.0)
MCH: 27.8 pg (ref 26.0–34.0)
MCHC: 33.4 g/dL (ref 30.0–36.0)
MCV: 83.2 fL (ref 78.0–100.0)
Platelets: 227 K/uL (ref 150–400)
RBC: 4.53 MIL/uL (ref 3.87–5.11)
RDW: 14.5 % (ref 11.5–15.5)
WBC: 6.6 K/uL (ref 4.0–10.5)

## 2012-12-10 LAB — BASIC METABOLIC PANEL
BUN: 29 mg/dL — ABNORMAL HIGH (ref 6–23)
CO2: 24 mEq/L (ref 19–32)
Calcium: 9.5 mg/dL (ref 8.4–10.5)
Chloride: 104 mEq/L (ref 96–112)
Creat: 1.48 mg/dL — ABNORMAL HIGH (ref 0.50–1.10)
Glucose, Bld: 138 mg/dL — ABNORMAL HIGH (ref 70–99)
Potassium: 4.2 mEq/L (ref 3.5–5.3)
Sodium: 139 mEq/L (ref 135–145)

## 2012-12-10 MED ORDER — GUAIFENESIN-DM 100-10 MG/5ML PO SYRP: 5.0000 mL | ORAL_SOLUTION | Freq: Three times a day (TID) | ORAL | Status: DC | PRN

## 2012-12-10 NOTE — Assessment & Plan Note (Signed)
Blood pressure is well controlled at present. I am concerned that with her illness she may have some evidence of dehydration. She is due for a BMET on this appointment. I will order this today. She will continue current medication regimen until labs are available to review.

## 2012-12-10 NOTE — Progress Notes (Signed)
   HPI: [name] is a 76 y/o patient of Dr.McDowell we are following for ongoing assessment and treatment of NICM, hypertension, nonobstructive CAD, with history of RAS, CVA, Asthma and hypothyroidism. On last visit with Dr. Diona Browner, the patient was changed on antihypertensive to include addition of HCTZ to lisinopril with follow up BMET to be done on this visit.    She was in the [facility] ER two days ago with cough, congestion, fever of 100.3 with flu-like symptoms. She was told she had a viral illness sent home to rest and rehydrate. She was not officially diagnosed with the flu, but she has not had a flu shot this year. She states she continues to suffer from the symptoms and has been in her bed for the last two days, getting out only to come to this appointment. She states she is going to see Dr.McInnis after she leaves [place] today. I have placed a face mask on her to protect the staff and others until she is ruled out.  No Known Allergies  Current Outpatient Prescriptions  Medication Sig Dispense Refill  . carvedilol (COREG) 25 MG tablet Take 1 tablet (25 mg total) by mouth 2 (two) times daily with a meal.  90 tablet  3  . diphenoxylate-atropine (LOMOTIL) 2.5-0.025 MG per tablet Take 1 tablet by mouth 4 (four) times daily as needed for diarrhea or loose stools.  20 tablet  0  . lisinopril-hydrochlorothiazide (PRINZIDE,ZESTORETIC) 20-12.5 MG per tablet Take 1 tablet by mouth 2 (two) times daily.  60 tablet  11  . promethazine (PHENERGAN) 25 MG tablet Take 1 tablet (25 mg total) by mouth every 6 (six) hours as needed for nausea.  15 tablet  0  . simvastatin (ZOCOR) 20 MG tablet Take 1 tablet (20 mg total) by mouth at bedtime.  30 tablet  11    Past Medical History  Diagnosis Date  . Nonischemic cardiomyopathy     LVEF 20-25%  . Asthma   . Cerebrovascular disease     TIA and stroke   . Hypertension   . Renal artery stenosis     Bilaterial   . CAD (coronary artery disease)     Nonobstructive  at cath 2005  . Hypothyroidism   . Carotid artery disease   . Gout     Past Surgical History  Procedure Date  . Abdominal hysterectomy   . Cholecystectomy     WUJ:WJXBJY of systems complete and found to be negative unless listed above  PHYSICAL EXAM BP 126/78  Pulse 88  Ht 5\' 5"  (1.651 m)  Wt 183 lb 1.9 oz (83.063 kg)  BMI 30.47 kg/m2  SpO2 94%  General: Well developed, well nourished, in no acute distress Head: Eyes PERRLA, No xanthomas Runny nose, frequent sniffing..   Normal cephalic and atramatic  Lungs: Clear bilaterally to auscultation and percussion, coughing occasionally, but non-productively.Marland Kitchen Heart: HRRR S1 S2, without MRG.  Pulses are 2+ & equal.            No carotid bruit. No JVD.  No abdominal bruits. No femoral bruits. Abdomen: Bowel sounds are positive, abdomen soft and non-tender without masses or                  Hernia's noted. Msk:  Back normal, normal gait. Normal strength and tone for age. Extremities: No clubbing, cyanosis or edema.  DP +1 Neuro: Alert and oriented X 3. Psych:  Good affect, responds appropriately    ASSESSMENT AND PLAN

## 2012-12-10 NOTE — Progress Notes (Deleted)
Name: [name]    DOB: [birth year]  Age: 76 y.o.  MR#: [id number]       PCP:  Alice Reichert, MD      Insurance: @PAYORNAME @   CC:   No chief complaint on file.   VS BP 126/78  Pulse 88  Ht 5\' 5"  (1.651 m)  Wt 183 lb 1.9 oz (83.063 kg)  BMI 30.47 kg/m2  SpO2 94%  Weights Current Weight  12/10/12 183 lb 1.9 oz (83.063 kg)  12/08/12 181 lb 4 oz (82.214 kg)  09/07/12 179 lb 1.9 oz (81.248 kg)    Blood Pressure  BP Readings from Last 3 Encounters:  12/10/12 126/78  12/08/12 121/86  09/07/12 166/100     Admit date:  (Not on file) Last encounter with RMR:  07/10/2012   Allergy No Known Allergies  Current Outpatient Prescriptions  Medication Sig Dispense Refill  . carvedilol (COREG) 25 MG tablet Take 1 tablet (25 mg total) by mouth 2 (two) times daily with a meal.  90 tablet  3  . diphenoxylate-atropine (LOMOTIL) 2.5-0.025 MG per tablet Take 1 tablet by mouth 4 (four) times daily as needed for diarrhea or loose stools.  20 tablet  0  . lisinopril-hydrochlorothiazide (PRINZIDE,ZESTORETIC) 20-12.5 MG per tablet Take 1 tablet by mouth 2 (two) times daily.  60 tablet  11  . promethazine (PHENERGAN) 25 MG tablet Take 1 tablet (25 mg total) by mouth every 6 (six) hours as needed for nausea.  15 tablet  0  . simvastatin (ZOCOR) 20 MG tablet Take 1 tablet (20 mg total) by mouth at bedtime.  30 tablet  11    Discontinued Meds:   There are no discontinued medications.  Patient Active Problem List  Diagnosis  . HYPERLIPIDEMIA  . ESSENTIAL HYPERTENSION, BENIGN  . CORONARY ATHEROSCLEROSIS NATIVE CORONARY ARTERY  . CARDIOMYOPATHY    LABS Orders Only on 11/07/2012  Component Date Value  . Cholesterol 11/07/2012 149   . Triglycerides 11/07/2012 202*  . HDL 11/07/2012 33*  . Total CHOL/HDL Ratio 11/07/2012 4.5   . VLDL 11/07/2012 40   . LDL Cholesterol 11/07/2012 76      Results for this Opt Visit:     Results for orders placed in visit on 11/07/12  LIPID PANEL   Component Value Range   Cholesterol 149  0 - 200 mg/dL   Triglycerides 782 (*) <150 mg/dL   HDL 33 (*) >95 mg/dL   Total CHOL/HDL Ratio 4.5     VLDL 40  0 - 40 mg/dL   LDL Cholesterol 76  0 - 99 mg/dL    EKG Orders placed in visit on 07/10/12  . EKG 12-LEAD     Prior Assessment and Plan Problem List as of 12/10/2012          HYPERLIPIDEMIA   Last Assessment & Plan Note   09/07/2012 Office Visit Signed 09/07/2012  1:20 PM by Jonelle Sidle, MD    Will start simvastatin generic and check FLP in 2 months.    ESSENTIAL HYPERTENSION, BENIGN   Last Assessment & Plan Note   09/07/2012 Office Visit Signed 09/07/2012  1:20 PM by Jonelle Sidle, MD    Not well controlled. Medications being adjusted as noted above.    CORONARY ATHEROSCLEROSIS NATIVE CORONARY ARTERY   Last Assessment & Plan Note   03/25/2011 Office Visit Signed 03/25/2011  5:41 PM by Jonelle Sidle, MD    No reported angina, nonobstructive disease documented.  CARDIOMYOPATHY   Last Assessment & Plan Note   09/07/2012 Office Visit Signed 09/07/2012  1:19 PM by Jonelle Sidle, MD    Nonischemic, LVEF now 40-45%. Will continue Coreg at current dose and change to Lisinopril HCTZ 20/25 mg BID. Check BMET at followup.        Imaging: No results found.   FRS Calculation: Score not calculated. Missing: Total Cholesterol

## 2012-12-10 NOTE — Patient Instructions (Addendum)
Your physician recommends that you schedule a follow-up appointment in: 6 MONTHS WITH KL/SM  Your physician recommends that you return for lab work in: TODAY (BMET,CBC,INFLUENZA) SLIPS GIVEN  Your physician has recommended you make the following change in your medication:   1) START ROBITUSSIN DM EVERY 8 HOURS/AS NEEDED FOR COUGH

## 2012-12-10 NOTE — Assessment & Plan Note (Signed)
She has no evidence of CHF on this assessment. Her chest is clear despite her complaints of coughing and congestion. Last echo completed in Sept of 2013, EF of 40-45%. Continue coreg and lisinopril.

## 2012-12-10 NOTE — Assessment & Plan Note (Signed)
This diagnosis is yet to be confirmed. She has a fever of 100.3 with body aches, chills and congestion. She was seen in the ER two days ago,but no labs were completed. I will have CBC and influenza panel drawn. She is given a face mask to wear. She is encouraged to follow up with Dr. Renard Matter today. Labs should be available to him by the time she sees him.

## 2012-12-11 NOTE — Addendum Note (Signed)
Addended by: Derry Lory A on: 12/11/2012 01:53 PM   Modules accepted: Orders

## 2012-12-13 ENCOUNTER — Telehealth: Payer: Self-pay | Admitting: *Deleted

## 2012-12-13 ENCOUNTER — Encounter: Payer: Self-pay | Admitting: *Deleted

## 2012-12-13 MED ORDER — LISINOPRIL 20 MG PO TABS: 20.0000 mg | ORAL_TABLET | Freq: Every day | ORAL | Status: DC

## 2012-12-13 NOTE — Telephone Encounter (Signed)
Called pt to discuss lab results as follows:  Patient appears dehydrated on labs. Could be from acute illness or addition of HCTZ on last visit with Dr. Diona Browner. D/C HCTZ for now and have her BP reassessed in one week with repeat BMET. If she is on the combo lisniopril/HCTZ, give her new Rx for Lisinopril only. She may still have some of it left.  Pt advised she is taking the combo Lisinopril/HCTZ combo, advised to remove this from her medications and we will send the Lisinopril 20mg  ONLY to [facility] [place] and to advise pt that her lab slips have been faxed for the pt to have labs drawn on 12-20-12, pt understood and will have labs drawn and start new medication today, lab letter mailed today with instructions to have labs drawn/results, new medication sent via escribe

## 2012-12-20 LAB — BASIC METABOLIC PANEL
BUN: 11 mg/dL (ref 6–23)
CO2: 27 mEq/L (ref 19–32)
Calcium: 9 mg/dL (ref 8.4–10.5)
Chloride: 105 mEq/L (ref 96–112)
Creat: 1.09 mg/dL (ref 0.50–1.10)
Glucose, Bld: 127 mg/dL — ABNORMAL HIGH (ref 70–99)
Potassium: 3.8 mEq/L (ref 3.5–5.3)
Sodium: 142 mEq/L (ref 135–145)

## 2012-12-21 ENCOUNTER — Encounter: Payer: Self-pay | Admitting: *Deleted

## 2012-12-28 ENCOUNTER — Ambulatory Visit (INDEPENDENT_AMBULATORY_CARE_PROVIDER_SITE_OTHER): Payer: PRIVATE HEALTH INSURANCE | Admitting: *Deleted

## 2012-12-28 VITALS — BP 170/100 | HR 75 | Ht 65.0 in | Wt 181.0 lb

## 2012-12-28 DIAGNOSIS — I10 Essential (primary) hypertension: Secondary | ICD-10-CM

## 2012-12-28 MED ORDER — AMLODIPINE BESYLATE 5 MG PO TABS: 5.0000 mg | ORAL_TABLET | Freq: Every day | ORAL | Status: DC

## 2012-12-28 NOTE — Progress Notes (Signed)
Please add low dose amlodipine 5 mg daily to medication regimen. She is to be on low salt diet.

## 2012-12-28 NOTE — Progress Notes (Signed)
Difficult to know what to do with this. Patient was last seen by [name] NP at which time blood pressure was very well controlled. In the meanwhile HCTZ was discontinued from her regimen related to acute renal insufficiency with creatinine of 1.4. Subsequent creatinine normalized. See if the patient has some way of checking her blood pressures daily at home and recording the results. Rather than escalating her ACE inhibitor dose or adding another medication to her regimen based on one blood pressure reading, it would be better to have a true sense of what her pressures are running over time. Can always consider Norvasc.  Jonelle Sidle, M.D., F.A.C.C.

## 2012-12-28 NOTE — Progress Notes (Signed)
Attempted to contact patient regarding recommendations.  No answer and unable to leave message.

## 2012-12-28 NOTE — Progress Notes (Signed)
Presents for blood pressure check s/p TFN on 1/2, as a result of labs.  Lisinopril HCT was stopped and Lisinopril 20 mg restarted.  BP at 9/27 visit, where she was started on the lisinopril/HCT, blood pressure was 166/100, and 126/78 at 12/30 visit.  Patient claims to be taking all medications, as prescribed, however did not bring bottles for review.

## 2013-01-01 ENCOUNTER — Encounter: Payer: Self-pay | Admitting: *Deleted

## 2013-01-01 NOTE — Progress Notes (Signed)
Attempted to contact patient regarding recommendations.  Will send a certified letter.

## 2013-01-04 ENCOUNTER — Telehealth: Payer: Self-pay | Admitting: *Deleted

## 2013-01-04 NOTE — Telephone Encounter (Signed)
Noted phone notation from 12-28-12 per pt recommendations/results/medication management, noted signed certified letter received with reference number [id number]

## 2013-01-04 NOTE — Progress Notes (Signed)
Noted receipt of certified mail signed for a pt home address listed in pt chart reference number [id number]

## 2014-03-26 ENCOUNTER — Ambulatory Visit (HOSPITAL_COMMUNITY)
Admission: RE | Admit: 2014-03-26 | Discharge: 2014-03-26 | Disposition: A | Discharge status: Home / Self Care | Payer: PRIVATE HEALTH INSURANCE | Source: Ambulatory Visit | Attending: Family Medicine | Admitting: Family Medicine

## 2014-03-26 ENCOUNTER — Other Ambulatory Visit (HOSPITAL_COMMUNITY): Payer: Self-pay | Admitting: Family Medicine

## 2014-03-26 DIAGNOSIS — R053 Chronic cough: Secondary | ICD-10-CM

## 2014-03-26 DIAGNOSIS — R059 Cough, unspecified: Secondary | ICD-10-CM | POA: Insufficient documentation

## 2014-03-26 DIAGNOSIS — R0600 Dyspnea, unspecified: Secondary | ICD-10-CM

## 2014-03-26 DIAGNOSIS — R05 Cough: Secondary | ICD-10-CM | POA: Insufficient documentation

## 2014-03-26 DIAGNOSIS — R0989 Other specified symptoms and signs involving the circulatory and respiratory systems: Secondary | ICD-10-CM | POA: Insufficient documentation

## 2014-03-26 DIAGNOSIS — R0609 Other forms of dyspnea: Secondary | ICD-10-CM | POA: Insufficient documentation

## 2014-06-06 ENCOUNTER — Emergency Department (HOSPITAL_COMMUNITY): Payer: PRIVATE HEALTH INSURANCE

## 2014-06-06 ENCOUNTER — Inpatient Hospital Stay (HOSPITAL_COMMUNITY)
Admission: EM | Admit: 2014-06-06 | Discharge: 2014-06-11 | DRG: 190 | Disposition: A | Discharge status: Home Health | Payer: PRIVATE HEALTH INSURANCE | Attending: Family Medicine | Admitting: Family Medicine

## 2014-06-06 ENCOUNTER — Encounter (HOSPITAL_COMMUNITY): Payer: Self-pay | Admitting: Emergency Medicine

## 2014-06-06 DIAGNOSIS — I5043 Acute on chronic combined systolic (congestive) and diastolic (congestive) heart failure: Secondary | ICD-10-CM | POA: Insufficient documentation

## 2014-06-06 DIAGNOSIS — I2510 Atherosclerotic heart disease of native coronary artery without angina pectoris: Secondary | ICD-10-CM | POA: Diagnosis present

## 2014-06-06 DIAGNOSIS — Z825 Family history of asthma and other chronic lower respiratory diseases: Secondary | ICD-10-CM

## 2014-06-06 DIAGNOSIS — E876 Hypokalemia: Secondary | ICD-10-CM | POA: Diagnosis present

## 2014-06-06 DIAGNOSIS — R0600 Dyspnea, unspecified: Secondary | ICD-10-CM | POA: Diagnosis present

## 2014-06-06 DIAGNOSIS — I340 Nonrheumatic mitral (valve) insufficiency: Secondary | ICD-10-CM

## 2014-06-06 DIAGNOSIS — I509 Heart failure, unspecified: Secondary | ICD-10-CM | POA: Diagnosis present

## 2014-06-06 DIAGNOSIS — Z8673 Personal history of transient ischemic attack (TIA), and cerebral infarction without residual deficits: Secondary | ICD-10-CM

## 2014-06-06 DIAGNOSIS — J441 Chronic obstructive pulmonary disease with (acute) exacerbation: Principal | ICD-10-CM | POA: Diagnosis present

## 2014-06-06 DIAGNOSIS — I428 Other cardiomyopathies: Secondary | ICD-10-CM | POA: Diagnosis present

## 2014-06-06 DIAGNOSIS — I5021 Acute systolic (congestive) heart failure: Secondary | ICD-10-CM

## 2014-06-06 DIAGNOSIS — J45901 Unspecified asthma with (acute) exacerbation: Secondary | ICD-10-CM | POA: Diagnosis present

## 2014-06-06 DIAGNOSIS — I10 Essential (primary) hypertension: Secondary | ICD-10-CM | POA: Diagnosis present

## 2014-06-06 DIAGNOSIS — N289 Disorder of kidney and ureter, unspecified: Secondary | ICD-10-CM | POA: Diagnosis present

## 2014-06-06 DIAGNOSIS — E119 Type 2 diabetes mellitus without complications: Secondary | ICD-10-CM | POA: Diagnosis present

## 2014-06-06 DIAGNOSIS — E039 Hypothyroidism, unspecified: Secondary | ICD-10-CM | POA: Diagnosis present

## 2014-06-06 HISTORY — DX: Bronchitis, not specified as acute or chronic: J40

## 2014-06-06 HISTORY — DX: Atherosclerotic heart disease of native coronary artery without angina pectoris: I25.10

## 2014-06-06 LAB — CBC WITH DIFFERENTIAL/PLATELET
Basophils Absolute: 0 10*3/uL (ref 0.0–0.1)
Basophils Relative: 1 % (ref 0–1)
Eosinophils Absolute: 0.1 10*3/uL (ref 0.0–0.7)
Eosinophils Relative: 1 % (ref 0–5)
HCT: 39.8 % (ref 36.0–46.0)
Hemoglobin: 12.8 g/dL (ref 12.0–15.0)
Lymphocytes Relative: 30 % (ref 12–46)
Lymphs Abs: 1.8 10*3/uL (ref 0.7–4.0)
MCH: 26.8 pg (ref 26.0–34.0)
MCHC: 32.2 g/dL (ref 30.0–36.0)
MCV: 83.3 fL (ref 78.0–100.0)
Monocytes Absolute: 0.4 10*3/uL (ref 0.1–1.0)
Monocytes Relative: 7 % (ref 3–12)
Neutro Abs: 3.7 10*3/uL (ref 1.7–7.7)
Neutrophils Relative %: 61 % (ref 43–77)
Platelets: 214 10*3/uL (ref 150–400)
RBC: 4.78 MIL/uL (ref 3.87–5.11)
RDW: 14.8 % (ref 11.5–15.5)
WBC: 6 10*3/uL (ref 4.0–10.5)

## 2014-06-06 LAB — COMPREHENSIVE METABOLIC PANEL
ALT: 16 U/L (ref 0–35)
AST: 24 U/L (ref 0–37)
Albumin: 3.5 g/dL (ref 3.5–5.2)
Alkaline Phosphatase: 153 U/L — ABNORMAL HIGH (ref 39–117)
BUN: 12 mg/dL (ref 6–23)
CO2: 25 mEq/L (ref 19–32)
Calcium: 9.7 mg/dL (ref 8.4–10.5)
Chloride: 107 mEq/L (ref 96–112)
Creatinine, Ser: 1.04 mg/dL (ref 0.50–1.10)
GFR calc Af Amer: 59 mL/min — ABNORMAL LOW (ref >90)
GFR calc non Af Amer: 51 mL/min — ABNORMAL LOW (ref >90)
Glucose, Bld: 139 mg/dL — ABNORMAL HIGH (ref 70–99)
Potassium: 3.5 mEq/L — ABNORMAL LOW (ref 3.7–5.3)
Sodium: 145 mEq/L (ref 137–147)
Total Bilirubin: 0.4 mg/dL (ref 0.3–1.2)
Total Protein: 7.2 g/dL (ref 6.0–8.3)

## 2014-06-06 LAB — TROPONIN I
Troponin I: <0.3 ng/mL (ref <0.30)
Troponin I: <0.3 ng/mL (ref <0.30)
Troponin I: <0.3 ng/mL (ref <0.30)

## 2014-06-06 LAB — PRO B NATRIURETIC PEPTIDE: Pro B Natriuretic peptide (BNP): 2651 pg/mL — ABNORMAL HIGH (ref 0–450)

## 2014-06-06 MED ORDER — IPRATROPIUM-ALBUTEROL 0.5-2.5 (3) MG/3ML IN SOLN
3.0000 mL | Freq: Once | RESPIRATORY_TRACT | Status: AC
  Administered 2014-06-06: 3 mL via RESPIRATORY_TRACT
  Filled 2014-06-06: qty 3

## 2014-06-06 MED ORDER — FUROSEMIDE 10 MG/ML IJ SOLN
40.0000 mg | Freq: Once | INTRAMUSCULAR | Status: AC
  Administered 2014-06-06: 40 mg via INTRAVENOUS
  Filled 2014-06-06: qty 4

## 2014-06-06 MED ORDER — ALBUTEROL SULFATE (2.5 MG/3ML) 0.083% IN NEBU
2.5000 mg | INHALATION_SOLUTION | Freq: Once | RESPIRATORY_TRACT | Status: AC
  Administered 2014-06-06: 2.5 mg via RESPIRATORY_TRACT
  Filled 2014-06-06: qty 3

## 2014-06-06 MED ORDER — ALBUTEROL SULFATE (2.5 MG/3ML) 0.083% IN NEBU: 2.5000 mg | INHALATION_SOLUTION | RESPIRATORY_TRACT | Status: DC | PRN

## 2014-06-06 MED ORDER — LISINOPRIL 5 MG PO TABS
5.0000 mg | ORAL_TABLET | Freq: Every day | ORAL | Status: DC
  Administered 2014-06-06 – 2014-06-09 (×4): 5 mg via ORAL
  Filled 2014-06-06 (×4): qty 1

## 2014-06-06 MED ORDER — ENOXAPARIN SODIUM 40 MG/0.4ML ~~LOC~~ SOLN
40.0000 mg | SUBCUTANEOUS | Status: DC
  Administered 2014-06-06 – 2014-06-10 (×5): 40 mg via SUBCUTANEOUS
  Filled 2014-06-06 (×5): qty 0.4

## 2014-06-06 MED ORDER — ONDANSETRON HCL 4 MG/2ML IJ SOLN: 4.0000 mg | Freq: Four times a day (QID) | INTRAMUSCULAR | Status: DC | PRN

## 2014-06-06 MED ORDER — SODIUM CHLORIDE 0.9 % IJ SOLN
3.0000 mL | Freq: Two times a day (BID) | INTRAMUSCULAR | Status: DC
  Administered 2014-06-06 – 2014-06-11 (×7): 3 mL via INTRAVENOUS

## 2014-06-06 MED ORDER — METHYLPREDNISOLONE SODIUM SUCC 125 MG IJ SOLR
125.0000 mg | Freq: Once | INTRAMUSCULAR | Status: AC
  Administered 2014-06-06: 125 mg via INTRAVENOUS
  Filled 2014-06-06: qty 2

## 2014-06-06 MED ORDER — FUROSEMIDE 20 MG PO TABS: 20.0000 mg | ORAL_TABLET | Freq: Every day | ORAL | Status: DC

## 2014-06-06 MED ORDER — POTASSIUM CHLORIDE CRYS ER 20 MEQ PO TBCR
40.0000 meq | EXTENDED_RELEASE_TABLET | Freq: Once | ORAL | Status: AC
  Administered 2014-06-06: 40 meq via ORAL
  Filled 2014-06-06: qty 2

## 2014-06-06 MED ORDER — METHYLPREDNISOLONE SODIUM SUCC 40 MG IJ SOLR
40.0000 mg | Freq: Four times a day (QID) | INTRAMUSCULAR | Status: DC
  Administered 2014-06-06 – 2014-06-11 (×20): 40 mg via INTRAVENOUS
  Filled 2014-06-06 (×20): qty 1

## 2014-06-06 MED ORDER — SODIUM CHLORIDE 0.9 % IJ SOLN
3.0000 mL | INTRAMUSCULAR | Status: DC | PRN
  Administered 2014-06-07 – 2014-06-08 (×2): 3 mL via INTRAVENOUS

## 2014-06-06 MED ORDER — ACETAMINOPHEN 325 MG PO TABS: 650.0000 mg | ORAL_TABLET | ORAL | Status: DC | PRN

## 2014-06-06 MED ORDER — SODIUM CHLORIDE 0.9 % IV SOLN: 250.0000 mL | INTRAVENOUS | Status: DC | PRN

## 2014-06-06 NOTE — ED Notes (Signed)
MD at bedside. 

## 2014-06-06 NOTE — ED Notes (Signed)
Respiratory paged for repeat breathing treatment.

## 2014-06-06 NOTE — ED Notes (Signed)
MD Zammit at bedside. 

## 2014-06-06 NOTE — H&P (Signed)
History and Physical  [name] UVO:[id number] DOB: December 26, 1935 DOA: 06/06/2014  Referring physician: Dr. Roderic Palau in ED PCP: Lanette Hampshire, MD   Chief Complaint: short of breath  HPI:  78 year old woman with history of asthma and NICM presented to the emergency department with sudden onset shortness of breath beginning last evening. Initial evaluation suggested at the exacerbation and possible heart failure.  Patient reports she was hospitalized at [facility] approximately 3 weeks ago for shortness of breath. She does not further elaborate. She reports she has had lower extremity edema since that time but has otherwise felt well. She ran out of Xopenex several days ago. Suddenly last night she developed wheezing and shortness of breath. She has had no chest pain or other complaints. Perhaps some cough. No fever or systemic symptoms.  In the emergency department afebrile, RR 30s, SpO2 90% on RA. CMP unremarkable with a modest hypokalemia. CBC normal. EKG showed sinus rhythm with PVCs, right bundle branch block (old). Chest x-ray showed cardiomegaly and interstitial edema, stable to minimally improved from prior study.  Review of Systems:  Negative for fever, visual changes, sore throat, rash, new muscle aches, chest pain, dysuria, bleeding, n/v/abdominal pain.  Past Medical History  Diagnosis Date  . Nonischemic cardiomyopathy     LVEF 20-25%  . Asthma   . Cerebrovascular disease     TIA and stroke   . Hypertension   . CAD (coronary artery disease)     Nonobstructive at cath 2005  . Hypothyroidism   . Carotid artery disease   . Gout   . Renal artery stenosis     Bilaterial   . Bronchitis     Past Surgical History  Procedure Laterality Date  . Abdominal hysterectomy      Social History:  reports that she has never smoked. She has never used smokeless tobacco. She reports that she does not drink alcohol or use illicit drugs.  No Known Allergies  Family History  Problem  Relation Age of Onset  . Asthma Son      Prior to Admission medications   Medication Sig Start Date End Date Taking? Authorizing Provider  albuterol (PROVENTIL HFA;VENTOLIN HFA) 108 (90 BASE) MCG/ACT inhaler Inhale 2 puffs into the lungs every 4 (four) hours as needed for wheezing or shortness of breath.   Yes Historical Provider, MD  furosemide (LASIX) 40 MG tablet Take 20 mg by mouth daily.   Yes Historical Provider, MD  lisinopril (PRINIVIL,ZESTRIL) 5 MG tablet Take 5 mg by mouth daily.   Yes Historical Provider, MD   Physical Exam: Filed Vitals:   06/06/14 1307 06/06/14 1323 06/06/14 1415 06/06/14 1448  BP:  139/85 152/109 170/104  Pulse:  95 96 103  Temp:      TempSrc:      Resp:  23 32 32  Weight:      SpO2: 95% 97% 94% 90%   General: Examined emergency department. Appears nontoxic vital. Mild distress. Eyes: PERRL, normal lids, irises  ENT: grossly normal hearing, lips & tongue Neck: no LAD, masses or thyromegaly Cardiovascular: RRR, no m/r/g. 2+ bilateral lower extremity edema. Respiratory: Bilateral wheezes, some dependent rales. No rhonchi. Moderate increased respiratory effort. Able to speak in full senses. Tachypneic. Abdomen: soft, ntnd Skin: no rash or induration seen  Musculoskeletal: grossly normal tone BUE/BLE Psychiatric: grossly normal mood and affect, speech fluent and appropriate Neurologic: grossly non-focal.  Wt Readings from Last 3 Encounters:  06/06/14 79.379 kg (175 lb)  12/28/12 82.101 kg (181  lb)  12/10/12 83.063 kg (183 lb 1.9 oz)    Labs on Admission:  Basic Metabolic Panel:  Recent Labs Lab 06/06/14 1215  NA 145  K 3.5*  CL 107  CO2 25  GLUCOSE 139*  BUN 12  CREATININE 1.04  CALCIUM 9.7    Liver Function Tests:  Recent Labs Lab 06/06/14 1215  AST 24  ALT 16  ALKPHOS 153*  BILITOT 0.4  PROT 7.2  ALBUMIN 3.5    CBC:  Recent Labs Lab 06/06/14 1215  WBC 6.0  NEUTROABS 3.7  HGB 12.8  HCT 39.8  MCV 83.3  PLT 214      Radiological Exams on Admission: Dg Chest Portable 1 View  06/06/2014   CLINICAL DATA:  Shortness of breath and cough.  EXAM: PORTABLE CHEST - 1 VIEW  COMPARISON:  05/09/2014  FINDINGS: The cardiac silhouette remains enlarged, unchanged. Pulmonary vascular congestion and interstitial opacities consistent with edema are stable to minimally improved from the prior study. No definite pleural effusion or pneumothorax is identified. No acute osseous abnormality is seen.  IMPRESSION: Cardiomegaly and interstitial edema, stable to minimally improved from the prior study.   Electronically Signed   By: Logan Bores   On: 06/06/2014 13:15     Principal Problem:   Asthma exacerbation Active Problems:   CARDIOMYOPATHY   Dyspnea   Acute systolic CHF (congestive heart failure)   Assessment/Plan 1. Suspect asthma exacerbation. 2. Possible acute on chronic systolic congestive heart failure. No chest pain. No signs or symptoms to suggest ACS. EKG shows chronic right bundle branch block. Chest x-ray actually shows improvement in edema from previous study. 3. Hypokalemia. Replete. 4. Nonischemic cardiomyopathy, LVEF 40-45% by echocardiogram 2013. 5. History of nonobstructive coronary artery disease   Appears stable for admission to telemetry.  Treat asthma exacerbation with steroids, bronchodilators.  Check BNP, troponin  Replete potassium  Code Status: full code DVT prophylaxis: Lovenox Family Communication: none present Disposition Plan/Anticipated LOS: admit 2-3 days  Time spent: 50 minutes  Murray Hodgkins, MD  [facility] Pager [phone] 06/06/2014, 2:49 PM

## 2014-06-06 NOTE — ED Provider Notes (Signed)
CSN: [id number]     Arrival date & time 06/06/14  1138 History  This chart was scribed for Maudry Diego, MD by Roe Coombs, ED Scribe. The patient was seen in room APA07/APA07. Patient's care was started at 12:19 PM.   Chief Complaint  Patient presents with  . Shortness of Breath     Patient is a 78 y.o. female presenting with shortness of breath. The history is provided by the patient. No language interpreter was used.  Shortness of Breath Severity:  Moderate Duration:  1 week Timing:  Constant Chronicity:  New Ineffective treatments:  None tried (Xopenex) Associated symptoms: cough   Associated symptoms: no abdominal pain, no chest pain, no headaches and no rash     HPI Comments: [name] is a 78 y.o. female who presents to the Emergency Department complaining of moderate, constant shortness of breath and cough productive of white sputum for the past 1 week. Patient states that she was diagnosed with bronchitis, but she ran out of Xopenex 1 week ago. She denies chest pain, leg swelling, fever, or any other symptoms at this time. Patient's other history includes CVA, TIA, cardiomyopathy, and CAD.   Past Medical History  Diagnosis Date  . Nonischemic cardiomyopathy     LVEF 20-25%  . Asthma   . Cerebrovascular disease     TIA and stroke   . Hypertension   . CAD (coronary artery disease)     Nonobstructive at cath 2005  . Hypothyroidism   . Carotid artery disease   . Gout   . Renal artery stenosis     Bilaterial   . Bronchitis    Past Surgical History  Procedure Laterality Date  . Abdominal hysterectomy     History reviewed. No pertinent family history. History  Substance Use Topics  . Smoking status: Never Smoker   . Smokeless tobacco: Never Used  . Alcohol Use: No   OB History   Grav Para Term Preterm Abortions TAB SAB Ect Mult Living                 Review of Systems  Constitutional: Negative for appetite change and fatigue.  HENT: Negative for  congestion, ear discharge and sinus pressure.   Eyes: Negative for discharge.  Respiratory: Positive for cough and shortness of breath.   Cardiovascular: Negative for chest pain and leg swelling.  Gastrointestinal: Negative for abdominal pain and diarrhea.  Genitourinary: Negative for frequency and hematuria.  Musculoskeletal: Negative for back pain.  Skin: Negative for rash.  Neurological: Negative for seizures and headaches.  Psychiatric/Behavioral: Negative for hallucinations.    Allergies  Review of patient's allergies indicates no known allergies.  Home Medications   Prior to Admission medications   Medication Sig Start Date End Date Taking? Authorizing Provider  amLODipine (NORVASC) 5 MG tablet Take 1 tablet (5 mg total) by mouth daily. 12/28/12   Lendon Colonel, NP  carvedilol (COREG) 25 MG tablet Take 1 tablet (25 mg total) by mouth 2 (two) times daily with a meal. 07/10/12   Lendon Colonel, NP  diphenoxylate-atropine (LOMOTIL) 2.5-0.025 MG per tablet Take 1 tablet by mouth 4 (four) times daily as needed for diarrhea or loose stools. 12/08/12   Nat Christen, MD  guaiFENesin-dextromethorphan (ROBITUSSIN DM) 100-10 MG/5ML syrup Take 5 mLs by mouth every 8 (eight) hours as needed for cough. 12/10/12   Lendon Colonel, NP  lisinopril (PRINIVIL,ZESTRIL) 20 MG tablet Take 1 tablet (20 mg total) by mouth daily.  12/13/12   Lendon Colonel, NP  lisinopril-hydrochlorothiazide (PRINZIDE,ZESTORETIC) 20-12.5 MG per tablet Take 1 tablet by mouth 2 (two) times daily. 09/07/12   Satira Sark, MD  promethazine (PHENERGAN) 25 MG tablet Take 1 tablet (25 mg total) by mouth every 6 (six) hours as needed for nausea. 12/08/12   Nat Christen, MD  simvastatin (ZOCOR) 20 MG tablet Take 1 tablet (20 mg total) by mouth at bedtime. 09/07/12   Satira Sark, MD   Triage Vitals: BP 175/96  Pulse 98  Temp(Src) 98.7 F (37.1 C) (Oral)  Resp 38  Wt 175 lb (79.379 kg)  SpO2 92% Physical Exam   Constitutional: She is oriented to person, place, and time. She appears well-developed.  HENT:  Head: Normocephalic.  Eyes: Conjunctivae and EOM are normal. No scleral icterus.  Neck: Neck supple. No thyromegaly present.  Cardiovascular: Normal rate and regular rhythm.  Exam reveals no gallop and no friction rub.   No murmur heard. Pulmonary/Chest: No stridor. Tachypnea noted. She has wheezes. She has no rales. She exhibits no tenderness.  Mild wheezes.  Abdominal: She exhibits no distension. There is no tenderness. There is no rebound.  Musculoskeletal: Normal range of motion. She exhibits no edema.  Lymphadenopathy:    She has no cervical adenopathy.  Neurological: She is oriented to person, place, and time. She exhibits normal muscle tone. Coordination normal.  Skin: No rash noted. No erythema.  Psychiatric: She has a normal mood and affect. Her behavior is normal.    ED Course  Procedures (including critical care time)  COORDINATION OF CARE: 12:23 PM- Patient informed of current plan for treatment and evaluation and agrees with plan at this time.     Labs Review Labs Reviewed  CBC WITH DIFFERENTIAL  COMPREHENSIVE METABOLIC PANEL    Imaging Review No results found.   EKG Interpretation None      MDM   Final diagnoses:  None   Admit copd   The chart was scribed for me under my direct supervision.  I personally performed the history, physical, and medical decision making and all procedures in the evaluation of this patient.Maudry Diego, MD 06/06/14 [id number]

## 2014-06-06 NOTE — ED Notes (Signed)
Sob, cough non productive.  Alert,   No fever.  Ran out of xopenex last week.

## 2014-06-06 NOTE — ED Notes (Signed)
Respiratory therapist paged for breathing treatment.

## 2014-06-06 NOTE — ED Notes (Signed)
Hospitalist consult at bedside for evaluation.

## 2014-06-07 LAB — BASIC METABOLIC PANEL
BUN: 17 mg/dL (ref 6–23)
CO2: 21 mEq/L (ref 19–32)
Calcium: 9.7 mg/dL (ref 8.4–10.5)
Chloride: 100 mEq/L (ref 96–112)
Creatinine, Ser: 1.04 mg/dL (ref 0.50–1.10)
GFR calc Af Amer: 59 mL/min — ABNORMAL LOW (ref >90)
GFR calc non Af Amer: 51 mL/min — ABNORMAL LOW (ref >90)
Glucose, Bld: 335 mg/dL — ABNORMAL HIGH (ref 70–99)
Potassium: 3.6 mEq/L — ABNORMAL LOW (ref 3.7–5.3)
Sodium: 143 mEq/L (ref 137–147)

## 2014-06-07 LAB — HEMOGLOBIN A1C
Hgb A1c MFr Bld: 7.9 % — ABNORMAL HIGH (ref <5.7)
Mean Plasma Glucose: 180 mg/dL — ABNORMAL HIGH (ref <117)

## 2014-06-07 LAB — GLUCOSE, CAPILLARY
Glucose-Capillary: 297 mg/dL — ABNORMAL HIGH (ref 70–99)
Glucose-Capillary: 269 mg/dL — ABNORMAL HIGH (ref 70–99)
Glucose-Capillary: 289 mg/dL — ABNORMAL HIGH (ref 70–99)
Glucose-Capillary: 331 mg/dL — ABNORMAL HIGH (ref 70–99)

## 2014-06-07 LAB — TROPONIN I: Troponin I: <0.3 ng/mL (ref <0.30)

## 2014-06-07 MED ORDER — FUROSEMIDE 10 MG/ML IJ SOLN
20.0000 mg | Freq: Every day | INTRAMUSCULAR | Status: DC
  Administered 2014-06-07 – 2014-06-09 (×3): 20 mg via INTRAVENOUS
  Filled 2014-06-07 (×3): qty 2

## 2014-06-07 MED ORDER — INSULIN ASPART 100 UNIT/ML ~~LOC~~ SOLN
0.0000 [IU] | Freq: Every day | SUBCUTANEOUS | Status: DC
  Administered 2014-06-07: 4 [IU] via SUBCUTANEOUS
  Administered 2014-06-08 – 2014-06-10 (×3): 2 [IU] via SUBCUTANEOUS

## 2014-06-07 MED ORDER — INSULIN ASPART 100 UNIT/ML ~~LOC~~ SOLN
0.0000 [IU] | Freq: Three times a day (TID) | SUBCUTANEOUS | Status: DC
  Administered 2014-06-07 (×2): 8 [IU] via SUBCUTANEOUS
  Administered 2014-06-08: 15 [IU] via SUBCUTANEOUS
  Administered 2014-06-08 (×2): 8 [IU] via SUBCUTANEOUS
  Administered 2014-06-09: 11 [IU] via SUBCUTANEOUS
  Administered 2014-06-09: 8 [IU] via SUBCUTANEOUS
  Administered 2014-06-09: 5 [IU] via SUBCUTANEOUS
  Administered 2014-06-10: 11 [IU] via SUBCUTANEOUS
  Administered 2014-06-10: 15 [IU] via SUBCUTANEOUS
  Administered 2014-06-10 – 2014-06-11 (×3): 5 [IU] via SUBCUTANEOUS

## 2014-06-07 NOTE — Progress Notes (Signed)
Subjective: The patient is more comfortable today. She had less dyspnea. She states she has slight edema in her extremities.  Objective: Vital signs in last 24 hours: Temp:  [97.6 F (36.4 C)-98.7 F (37.1 C)] 97.6 F (36.4 C) (06/27 0426) Pulse Rate:  [90-103] 92 (06/27 0426) Resp:  [20-39] 20 (06/27 0426) BP: (139-175)/(85-113) 159/87 mmHg (06/27 0426) SpO2:  [88 %-100 %] 92 % (06/27 0426) Weight:  [79.379 kg (175 lb)-79.8 kg (175 lb 14.8 oz)] 79.8 kg (175 lb 14.8 oz) (06/27 0300) Weight change:     Intake/Output from previous day: 06/26 0701 - 06/27 0700 In: 480 [P.O.:480] Out: 2725 [Urine:2725] Intake/Output this shift:    Physical Exam: General appearance patient is alert and oriented  HEENT negative  Neck supple no JVD or thyroid abnormalities  Lungs occasional rhonchus over lower lung field  Abdomen no palpable organs or masses  Extremities mild edema   Recent Labs  06/06/14 1215  WBC 6.0  HGB 12.8  HCT 39.8  PLT 214   BMET  Recent Labs  06/06/14 1215 06/07/14 0555  NA 145 143  K 3.5* 3.6*  CL 107 100  CO2 25 21  GLUCOSE 139* 335*  BUN 12 17  CREATININE 1.04 1.04  CALCIUM 9.7 9.7    Studies/Results: Dg Chest Portable 1 View  06/06/2014   CLINICAL DATA:  Shortness of breath and cough.  EXAM: PORTABLE CHEST - 1 VIEW  COMPARISON:  05/09/2014  FINDINGS: The cardiac silhouette remains enlarged, unchanged. Pulmonary vascular congestion and interstitial opacities consistent with edema are stable to minimally improved from the prior study. No definite pleural effusion or pneumothorax is identified. No acute osseous abnormality is seen.  IMPRESSION: Cardiomegaly and interstitial edema, stable to minimally improved from the prior study.   Electronically Signed   By: Logan Bores   On: 06/06/2014 13:15    Medications:  . enoxaparin (LOVENOX) injection  40 mg Subcutaneous Q24H  . furosemide  20 mg Intravenous Daily  . furosemide  20 mg Oral Daily  .  lisinopril  5 mg Oral Daily  . methylPREDNISolone (SOLU-MEDROL) injection  40 mg Intravenous Q6H  . sodium chloride  3 mL Intravenous Q12H        Assessment/Plan: 1. Exacerbation of asthma  2. Interstitial edema with acute on chronic systolic congestive heart failure  3. Low serum potassium level  4. Nonischemic cardiomyopathy LVEF 40-45%  5. Nonobstructive coronary artery disease  Plan to continue steroids bronchodilators continue to monitor serum potassium continue low-dose furosemide with potassium supplement.  LOS: 1 day   Siearra Amberg G 06/07/2014, 8:02 AM

## 2014-06-08 LAB — GLUCOSE, CAPILLARY
Glucose-Capillary: 260 mg/dL — ABNORMAL HIGH (ref 70–99)
Glucose-Capillary: 367 mg/dL — ABNORMAL HIGH (ref 70–99)
Glucose-Capillary: 287 mg/dL — ABNORMAL HIGH (ref 70–99)
Glucose-Capillary: 243 mg/dL — ABNORMAL HIGH (ref 70–99)

## 2014-06-08 LAB — BASIC METABOLIC PANEL
BUN: 29 mg/dL — ABNORMAL HIGH (ref 6–23)
CO2: 25 mEq/L (ref 19–32)
Calcium: 9.7 mg/dL (ref 8.4–10.5)
Chloride: 99 mEq/L (ref 96–112)
Creatinine, Ser: 1.11 mg/dL — ABNORMAL HIGH (ref 0.50–1.10)
GFR calc Af Amer: 55 mL/min — ABNORMAL LOW (ref >90)
GFR calc non Af Amer: 47 mL/min — ABNORMAL LOW (ref >90)
Glucose, Bld: 274 mg/dL — ABNORMAL HIGH (ref 70–99)
Potassium: 4.3 mEq/L (ref 3.7–5.3)
Sodium: 139 mEq/L (ref 137–147)

## 2014-06-08 MED ORDER — METFORMIN HCL 500 MG PO TABS
500.0000 mg | ORAL_TABLET | Freq: Two times a day (BID) | ORAL | Status: DC
  Administered 2014-06-08 – 2014-06-09 (×4): 500 mg via ORAL
  Filled 2014-06-08 (×4): qty 1

## 2014-06-08 MED ORDER — POTASSIUM CHLORIDE CRYS ER 20 MEQ PO TBCR
20.0000 meq | EXTENDED_RELEASE_TABLET | Freq: Every day | ORAL | Status: DC
  Administered 2014-06-08 – 2014-06-09 (×2): 20 meq via ORAL
  Filled 2014-06-08 (×3): qty 1

## 2014-06-08 NOTE — Progress Notes (Signed)
Utilization review Completed Kimberly Welborn RN BSN   

## 2014-06-08 NOTE — Progress Notes (Signed)
Subjective: The patient is alert and comfortable today with less dyspnea and less edema in extremities  Objective: Vital signs in last 24 hours: Temp:  [98 F (36.7 C)-98.2 F (36.8 C)] 98.1 F (36.7 C) (06/28 0423) Pulse Rate:  [86-89] 87 (06/28 0423) Resp:  [20] 20 (06/28 0423) BP: (147-154)/(81-101) 147/101 mmHg (06/28 0423) SpO2:  [97 %-99 %] 97 % (06/28 0423) Weight:  [80.4 kg (177 lb 4 oz)] 80.4 kg (177 lb 4 oz) (06/28 0447) Weight change: 1.021 kg (2 lb 4 oz) Last BM Date: 06/07/14  Intake/Output from previous day: 06/27 0701 - 06/28 0700 In: 1440 [P.O.:1440] Out: 401 [Urine:400; Stool:1] Intake/Output this shift: Total I/O In: 720 [P.O.:720] Out: 400 [Urine:400]  Physical Exam: General appearance the patient is alert and oriented  HEENT negative  Neck supple no JVD or thyroid abnormalities  Lungs occasional rhonchus heard oral lung field  Abdomen no palpable organs or masses  Extremities free of edema   Recent Labs  06/06/14 1215  WBC 6.0  HGB 12.8  HCT 39.8  PLT 214   BMET  Recent Labs  06/06/14 1215 06/07/14 0555  NA 145 143  K 3.5* 3.6*  CL 107 100  CO2 25 21  GLUCOSE 139* 335*  BUN 12 17  CREATININE 1.04 1.04  CALCIUM 9.7 9.7    Studies/Results: Dg Chest Portable 1 View  06/06/2014   CLINICAL DATA:  Shortness of breath and cough.  EXAM: PORTABLE CHEST - 1 VIEW  COMPARISON:  05/09/2014  FINDINGS: The cardiac silhouette remains enlarged, unchanged. Pulmonary vascular congestion and interstitial opacities consistent with edema are stable to minimally improved from the prior study. No definite pleural effusion or pneumothorax is identified. No acute osseous abnormality is seen.  IMPRESSION: Cardiomegaly and interstitial edema, stable to minimally improved from the prior study.   Electronically Signed   By: Logan Bores   On: 06/06/2014 13:15    Medications:  . enoxaparin (LOVENOX) injection  40 mg Subcutaneous Q24H  . furosemide  20 mg  Intravenous Daily  . insulin aspart  0-15 Units Subcutaneous TID WC  . insulin aspart  0-5 Units Subcutaneous QHS  . lisinopril  5 mg Oral Daily  . methylPREDNISolone (SOLU-MEDROL) injection  40 mg Intravenous Q6H  . sodium chloride  3 mL Intravenous Q12H        Assessment/Plan: 1. Exacerbation of asthma to continue current regimen  2 interstitial edema with acute on chronic systolic congestive heart failure plan to continue furosemide continue to monitor weights  3. Low serum potassium continue to monitor  4. Nonischemic cardiomyopathy LVEF of 40-45%  5. Nonobstructive coronary artery disease  Plan to continue steroids bronchodilators continue monitor serum potassium continue low-dose furosemide with potassium supplement   LOS: 2 days   MCINNIS,ANGUS G 06/08/2014, 6:56 AM

## 2014-06-09 ENCOUNTER — Encounter (HOSPITAL_COMMUNITY): Payer: Self-pay | Admitting: Cardiology

## 2014-06-09 DIAGNOSIS — I10 Essential (primary) hypertension: Secondary | ICD-10-CM

## 2014-06-09 DIAGNOSIS — I5043 Acute on chronic combined systolic (congestive) and diastolic (congestive) heart failure: Secondary | ICD-10-CM

## 2014-06-09 DIAGNOSIS — I340 Nonrheumatic mitral (valve) insufficiency: Secondary | ICD-10-CM

## 2014-06-09 LAB — GLUCOSE, CAPILLARY
Glucose-Capillary: 223 mg/dL — ABNORMAL HIGH (ref 70–99)
Glucose-Capillary: 312 mg/dL — ABNORMAL HIGH (ref 70–99)
Glucose-Capillary: 254 mg/dL — ABNORMAL HIGH (ref 70–99)
Glucose-Capillary: 227 mg/dL — ABNORMAL HIGH (ref 70–99)

## 2014-06-09 LAB — BASIC METABOLIC PANEL
BUN: 35 mg/dL — ABNORMAL HIGH (ref 6–23)
CO2: 25 mEq/L (ref 19–32)
Calcium: 9.9 mg/dL (ref 8.4–10.5)
Chloride: 101 mEq/L (ref 96–112)
Creatinine, Ser: 1.14 mg/dL — ABNORMAL HIGH (ref 0.50–1.10)
GFR calc Af Amer: 53 mL/min — ABNORMAL LOW (ref >90)
GFR calc non Af Amer: 46 mL/min — ABNORMAL LOW (ref >90)
Glucose, Bld: 244 mg/dL — ABNORMAL HIGH (ref 70–99)
Potassium: 4.7 mEq/L (ref 3.7–5.3)
Sodium: 141 mEq/L (ref 137–147)

## 2014-06-09 MED ORDER — POTASSIUM CHLORIDE CRYS ER 20 MEQ PO TBCR
20.0000 meq | EXTENDED_RELEASE_TABLET | Freq: Two times a day (BID) | ORAL | Status: DC
  Administered 2014-06-09 – 2014-06-10 (×2): 20 meq via ORAL
  Filled 2014-06-09 (×2): qty 1

## 2014-06-09 MED ORDER — LIVING WELL WITH DIABETES BOOK
Freq: Once | Status: AC
  Administered 2014-06-09: 10:00:00
  Filled 2014-06-09: qty 1

## 2014-06-09 MED ORDER — BISOPROLOL FUMARATE 5 MG PO TABS
5.0000 mg | ORAL_TABLET | Freq: Every day | ORAL | Status: DC
  Administered 2014-06-09 – 2014-06-11 (×3): 5 mg via ORAL
  Filled 2014-06-09 (×5): qty 1

## 2014-06-09 MED ORDER — FUROSEMIDE 10 MG/ML IJ SOLN: 40.0000 mg | Freq: Two times a day (BID) | INTRAMUSCULAR | Status: DC

## 2014-06-09 MED ORDER — LISINOPRIL 10 MG PO TABS
10.0000 mg | ORAL_TABLET | Freq: Every day | ORAL | Status: DC
  Administered 2014-06-10 – 2014-06-11 (×2): 10 mg via ORAL
  Filled 2014-06-09 (×2): qty 1

## 2014-06-09 MED ORDER — FUROSEMIDE 10 MG/ML IJ SOLN
20.0000 mg | Freq: Once | INTRAMUSCULAR | Status: AC
  Administered 2014-06-09: 20 mg via INTRAVENOUS
  Filled 2014-06-09: qty 2

## 2014-06-09 MED ORDER — FUROSEMIDE 10 MG/ML IJ SOLN
40.0000 mg | Freq: Every day | INTRAMUSCULAR | Status: DC
  Administered 2014-06-10: 40 mg via INTRAVENOUS
  Filled 2014-06-09: qty 4

## 2014-06-09 NOTE — Plan of Care (Signed)
Problem: Food- and Nutrition-Related Knowledge Deficit (NB-1.1) Goal: Nutrition education Formal process to instruct or train a patient/client in a skill or to impart knowledge to help patients/clients voluntarily manage or modify food choices and eating behavior to maintain or improve health. Outcome: Adequate for Discharge  RD consulted for nutrition education regarding diabetes.     Lab Results  Component Value Date    HGBA1C 7.9* 06/07/2014    RD provided "Carbohydrate Counting for People with Diabetes" and "Heart Failure Nutrition Therapy" handouts from the Academy of Nutrition and Dietetics as well as [employer]. Nursing also has provided the "Living Well with Diabetes"booklet. Discussed different food groups and their effects on blood sugar, emphasizing carbohydrate-containing foods. Provided list of carbohydrates and recommended serving sizes of common foods.  Discussed importance of controlled and consistent carbohydrate intake throughout the day and avoid adding salt to foods. Provided examples of ways to decrease sodium intake and balance meals/snacks and encouraged intake of high-fiber, whole grain complex carbohydrates. Teach back method used.  Expect fair compliance.  Body mass index is 30.06 kg/(m^2). Pt meets criteria for obesity class 1 based on current BMI.  Current diet order is Heart Healthy/CHO Modified, patient is consuming approximately 50-75% of meals at this time. Labs and medications reviewed. No further nutrition interventions warranted at this time. RD contact information provided. If additional nutrition issues arise, please re-consult RD.  Colman Cater MS,RD,CSG,LDN Office: [phone] Pager: [phone]

## 2014-06-09 NOTE — Progress Notes (Signed)
Patient given DM booklet and has viewed videos.

## 2014-06-09 NOTE — Consult Note (Addendum)
CARDIOLOGY CONSULT NOTE   Patient ID: [name] MRN: [id number] DOB/AGE: 09/05/1936 78 y.o.  Admit Date: 06/06/2014 Referring Physician: Marjean Donna MD Primary Physician: Lanette Hampshire, MD Consulting Cardiologist: Rozann Lesches MD Primary Cardiologist: Rozann Lesches MD Reason for Consultation: CHF    Clinical Summary [name] is a 78 y.o.female with NICM, most recent EF 30-35%, now admitted with worsening shortness of breath, lower extremity edema, and acute on chronic mixed CHF. The patient has recently been discharged from [facility] approximately 3 weeks ago, where she was admitted for similar symptoms. She was treated with diuretics. The patient had worsening symptoms approximately one day prior to admission with lower extremity edema, coughing and congestion, and she thought was related to bronchitis.  She states while at [facility], she was given IV diuretic medications and was seen by a cardiology physician, presumably with the [facility] practice. The patient states that she does not weigh herself, but she states she tries to limit her salt. She states she knows that "my heart is weak."  On arrival to the emergency room the patient was found to be hypertensive with a blood pressure of 175/96, heart rate 98, O2 sat 92%, respirations 38, with a temperature of 98.7. Review of labs were unremarkable with the exception of hypokalemia with potassium of 3.5. Creatinine was 1.04. Alkaline phosphatase was elevated to 153. GFR 51. Her BNP was elevated at 2651. Cardiac enzyme was negative. Chest x-ray revealed cardiomegaly with interstitial edema, stable to minimally improved. EKG demonstrated sinus rhythm with frequent PACs and PVCs.  She was treated with nebulizer treatments, IV Solu-Medrol, Lasix 40 mg x1. She will continue on IV Lasix 20 mg IV daily. Weight is essentially unchanged since admission questioning accuracy. Followup labs did show improvement of potassium  status to 4.7, creatinine increased slightly at 1.14. Currently the patient is asymptomatic breathing much better, no complaints lower extremity edema, but continues to have some mild shortness of breath with exertion.    No Known Allergies  Medications Scheduled Medications: . bisoprolol  5 mg Oral Daily  . enoxaparin (LOVENOX) injection  40 mg Subcutaneous Q24H  . furosemide  40 mg Intravenous Daily  . insulin aspart  0-15 Units Subcutaneous TID WC  . insulin aspart  0-5 Units Subcutaneous QHS  . [START ON 06/10/2014] lisinopril  10 mg Oral Daily  . living well with diabetes book   Does not apply Once  . metFORMIN  500 mg Oral BID WC  . methylPREDNISolone (SOLU-MEDROL) injection  40 mg Intravenous Q6H  . potassium chloride  20 mEq Oral BID  . sodium chloride  3 mL Intravenous Q12H    PRN Medications: sodium chloride, acetaminophen, albuterol, ondansetron (ZOFRAN) IV, sodium chloride   Past Medical History  Diagnosis Date  . Nonischemic cardiomyopathy     LVEF 20-25%  . Asthma   . Cerebrovascular disease     TIA and stroke   . Essential hypertension, benign   . Coronary atherosclerosis of native coronary artery     Nonobstructive at cath 2005  . Hypothyroidism   . Carotid artery disease   . Gout   . Renal artery stenosis     Bilateral  . Bronchitis     Past Surgical History  Procedure Laterality Date  . Abdominal hysterectomy      Family History  Problem Relation Age of Onset  . Asthma Son     Social History [name] reports that she has never smoked. She has never  used smokeless tobacco. [name] reports that she does not drink alcohol.   Review of Systems Other systems reviewed and negative except as outlined.  Physical Examination Blood pressure 160/83, pulse 95, temperature 97.6 F (36.4 C), temperature source Oral, resp. rate 20, height 5\' 4"  (1.626 m), weight 175 lb 3.2 oz (79.47 kg), SpO2 95.00%.  Intake/Output Summary (Last 24 hours) at  06/09/14 1035 Last data filed at 06/09/14 0900  Gross per 24 hour  Intake    720 ml  Output   1150 ml  Net   -430 ml      GEN: Awake alert, in no acute distress, no shortness of breath. HEENT: Conjunctiva and lids normal, oropharynx clear with moist mucosa. Neck: Supple, no elevated JVP or carotid bruits, no thyromegaly. Lungs: Clear to auscultation, nonlabored breathing at rest. Cardiac: Regular rate and rhythm, distant, no S3 or significant systolic murmur, no pericardial rub. Abdomen: Soft, nontender, no hepatomegaly, bowel sounds present, no guarding or rebound. Extremities: No pitting edema in the dependent position, distal pulses 2+. Skin: Warm and dry. Musculoskeletal: No kyphosis. Neuropsychiatric: Alert and oriented x3, affect grossly appropriate.   Prior Cardiac Testing/Procedures 1. Echocardiogram 06/07/2014 Left ventricle: The cavity size was normal. Wall thickness was increased in a pattern of moderate to severe LVH. Systolic function was moderately to severely reduced. The estimated ejection fraction was in the range of 30% to 35%. Severe hypokinesis of the entirelateral, inferolateral, and inferior myocardium. Doppler parameters are consistent with a reversible restrictive pattern, indicative of decreased left ventricular diastolic compliance and/or increased left atrial pressure (grade 3 diastolic dysfunction). - Mitral valve: There was moderate to severe regurgitation. - Left atrium: The atrium was moderately dilated. - Pulmonary arteries: Systolic pressure was moderately increased.  2.Cardiac Cath:04/2004 CORONARY ARTERIOGRAPHY:  1. The left main coronary artery was normal.  2. The left anterior descending had a long mid 30-40% stenosis.  3. The first diagonal was large and normal.  4. The circumflex and the AV groove was normal. There was a large branching  mid obtuse marginal. The superior branch had ostial 30% stenosis.  5. The right coronary artery was  a large dominant vessel with a large PDA.  It was normal.  LEFT VENTRICULOGRAM: The left ventriculogram was obtained in the RAO  projection. The ejection fraction was approximately 30-35% with global  hypokinesis.   Lab Results  Basic Metabolic Panel:  Recent Labs Lab 06/06/14 1215 06/07/14 0555 06/08/14 0605 06/09/14 0626  NA 145 143 139 141  K 3.5* 3.6* 4.3 4.7  CL 107 100 99 101  CO2 25 21 25 25   GLUCOSE 139* 335* 274* 244*  BUN 12 17 29* 35*  CREATININE 1.04 1.04 1.11* 1.14*  CALCIUM 9.7 9.7 9.7 9.9    Liver Function Tests:  Recent Labs Lab 06/06/14 1215  AST 24  ALT 16  ALKPHOS 153*  BILITOT 0.4  PROT 7.2  ALBUMIN 3.5    CBC:  Recent Labs Lab 06/06/14 1215  WBC 6.0  NEUTROABS 3.7  HGB 12.8  HCT 39.8  MCV 83.3  PLT 214    Cardiac Enzymes:  Recent Labs Lab 06/06/14 1215 06/06/14 1628 06/06/14 2154 06/07/14 0555  TROPONINI <0.30 <0.30 <0.30 <0.30    BNP: 2,651   Radiology: CXR 06/06/2014 IMPRESSION: Cardiomegaly and interstitial edema, stable to minimally improved from the prior study.   ECG: NSR, PAC's,PVC's.    Impression and Recommendations  1. Acute on Chronic Mixed CHF in the setting of NICM: Echocardiogram  completed during this admission revealed LVEF of 30-35% with severe hypokinesis of the entire lateral and inferior lateral and inferior myocardium. She was also noted to have grade 3 diastolic dysfunction, with moderate to severe mitral valve regurgitation.  She is on IV Lasix and has diuresed some, although accuracy is uncertain. Weight is unchanged. Symptomatically she has improved.  Review of past medical records states that she has not been seen in the office since December of 2013. She denies medical noncompliance, but this is her second admission in a month for decompensated CHF. I will request records from [facility] from recent hospitalization. Will need to have strict titration of her medications and followup.  Uncertain dry weight, on discharge. Weight in our office in December 2013 was 183 pounds. Weight on admission was listed at 177 pounds and then 175 pounds.  Review of home meds has her taking Lasix 20 mg  daily. Will increase lasix to 40 mg IV QD, May need to advance further . She will need to be weighed daily and avoid salty foods at home. Continue ACE, but at higher dose of 10 mg daily.   2. Hypertension: Remains only moderately controlled, will need checked her control with diminished EF. She is no longer on beta blocker, records are requested from [facility], likely not on this related to breathing status and/or lung disease. The last office visit in 2013 she had been on carvedilol 25 mg twice a day.    Signed: Phill Myron. Malic Rosten NP  06/09/2014, 10:35 AM Co-Sign MD   Attending note:  Patient seen and examined. Reviewed records and modified above note by Ms. Marbella Markgraf NP. [name] presents with acute on chronic mixed heart failure, LVEF just recently documented in the range of 30-35%. She has a history of nonischemic cardiomyopathy with nonobstructive CAD, wall motion abnormalities were described in somewhat focal pattern by her most recent echocardiogram however. She also has grade 3 diastolic dysfunction and what is described as moderate to severe mitral regurgitation, had previously been only mild. She reports being hospitalized at [facility] approximately 3 weeks ago, records pending. Was seen by the [facility] cardiology practice at that time.   So far she has not diuresed very much, no significant change in weight. Still has some leg edema, decreased breath sounds at the lung bases. Plan is to increase IV Lasix, advance ACE inhibitor dose, also place her on low dose bisoprolol (beta-1 selective beta blocker since she also has asthma). Echocardiogram will be reviewed. In the past, she had preferred a fairly conservative approach to overall management. We willl continue to follow with you  and make further recommendations.  Satira Sark, M.D., F.A.C.C.

## 2014-06-09 NOTE — Progress Notes (Signed)
Subjective: The patient had a fairly comfortable night but states that she had some shortness of breath  Objective: Vital signs in last 24 hours: Temp:  [97.6 F (36.4 C)-98.3 F (36.8 C)] 97.6 F (36.4 C) (06/29 0448) Pulse Rate:  [95-98] 95 (06/29 0448) Resp:  [20] 20 (06/29 0448) BP: (160-167)/(83-108) 160/83 mmHg (06/29 0448) SpO2:  [94 %-95 %] 95 % (06/29 0448) Weight:  [79.47 kg (175 lb 3.2 oz)] 79.47 kg (175 lb 3.2 oz) (06/29 0444) Weight change: -0.93 kg (-2 lb 0.8 oz) Last BM Date: 06/07/14  Intake/Output from previous day: 06/28 0701 - 06/29 0700 In: 720 [P.O.:720] Out: 1150 [Urine:1150] Intake/Output this shift: Total I/O In: -  Out: 500 [Urine:500]  Physical Exam: General appearance the patient is alert and oriented  HEENT negative  Neck supple no JVD or thyroid abnormalities  Lungs occasional rhonchus heard over lower lung field  Abdomen no palpable organs or masses  Extremities free of edema   Recent Labs  06/06/14 1215  WBC 6.0  HGB 12.8  HCT 39.8  PLT 214   BMET  Recent Labs  06/07/14 0555 06/08/14 0605  NA 143 139  K 3.6* 4.3  CL 100 99  CO2 21 25  GLUCOSE 335* 274*  BUN 17 29*  CREATININE 1.04 1.11*  CALCIUM 9.7 9.7    Studies/Results: No results found.  Medications:  . enoxaparin (LOVENOX) injection  40 mg Subcutaneous Q24H  . furosemide  20 mg Intravenous Daily  . insulin aspart  0-15 Units Subcutaneous TID WC  . insulin aspart  0-5 Units Subcutaneous QHS  . lisinopril  5 mg Oral Daily  . metFORMIN  500 mg Oral BID WC  . methylPREDNISolone (SOLU-MEDROL) injection  40 mg Intravenous Q6H  . potassium chloride  20 mEq Oral Daily  . sodium chloride  3 mL Intravenous Q12H        Assessment/Plan: 1. Exacerbation of asthma-no recurrence to continue current regimen  2. Interstitial edema with acute on chronic systolic congestive heart failure-plan to continue furosemide continue to monitor weights  3. Low serum  potassium-repleted  4. Nonischemic cardiomyopathy with LV EF 40-45% and nonobstructive coronary artery disease-continue current regimen obtain cardiology consult  5. Diabetes mellitus-plan to continue diabetic diet metformin and sliding scale short-acting insulin.   LOS: 3 days   MCINNIS,ANGUS G 06/09/2014, 5:59 AM

## 2014-06-09 NOTE — Care Management Note (Signed)
    Page 1 of 1   06/11/2014     3:54:34 PM CARE MANAGEMENT NOTE 06/11/2014  Patient:  [name], [name]   Account Number:  [id number]  Date Initiated:  06/09/2014  Documentation initiated by:  Vladimir Creeks  Subjective/Objective Assessment:   Pt lives at home with family. Granddaughter, who does not live with her, is her Physiological scientist, 4h a day/ 5 days a week. They would like to increase these hours, because pt is dyspneic  and unable to do much for herself. Pt admitted with     Action/Plan:   dyspnea, asthma, CHF. Explained they should ask her case manager at the [facility] [employer] [employer] under which the granddaughter works, and they will refer to the state for review. Pt may need O2 at D/C and will follow for this and [facility] needs   Anticipated DC Date:  06/11/2014   Anticipated DC Plan:  [place]  CM consult      [facility] Choice  HOME HEALTH   Choice offered to / List presented to:  C-1 Patient        [place] arranged  HH-1 RN  [place].   Status of service:  Completed, signed off Medicare Important Message given?  YES (If response is "NO", the following Medicare IM given date fields will be blank) Date Medicare IM given:  06/11/2014 Medicare IM given by:  Vladimir Creeks Date Additional Medicare IM given:   Additional Medicare IM given by:    Discharge Disposition:  [place]  Per UR Regulation:  Reviewed for med. necessity/level of care/duration of stay  If discussed at [place] of Stay Meetings, dates discussed:    Comments:  06/11/14 0930 Vladimir Creeks RN/CM 06/09/14 1500 Vladimir Creeks RN/CM

## 2014-06-09 NOTE — Progress Notes (Signed)
Inpatient Diabetes Program Recommendations  AACE/ADA: New Consensus Statement on Inpatient Glycemic Control (2013)  Target Ranges:  Prepandial:   less than 140 mg/dL      Peak postprandial:   less than 180 mg/dL (1-2 hours)      Critically ill patients:  140 - 180 mg/dL   Results for [name], [name] (MRN [id number]) as of 06/09/2014 09:35  Ref. Range 06/07/2014 05:55  Hemoglobin A1C Latest Range: <5.7 % 7.9 (H)   Results for [name], [name] (MRN [id number]) as of 06/09/2014 09:35  Ref. Range 06/08/2014 07:45 06/08/2014 11:43 06/08/2014 16:18 06/08/2014 20:15 06/09/2014 07:35  Glucose-Capillary Latest Range: 70-99 mg/dL 260 (H) 367 (H) 287 (H) 243 (H) 223 (H)   Diabetes history: No Outpatient Diabetes medications: NA Current orders for Inpatient glycemic control: Novolog 0-15 units AC, Novolog 0-5 units HS, Metformin 500 mg BID  Inpatient Diabetes Program Recommendations Insulin - Basal: Please consider ordering low dose basal insulin while inpatient and ordered steroids, recommend starting with Lantus 10 units daily and adjust accordingly. Insulin - Meal Coverage: While inpatient and ordered steroids, please consider ordering Novolog 5 units TID with meals. HgbA1C: A1C 7.9% on 06/07/14. No prior history of diabetes.  MD, please inform patient and staff if diagnosing with DM so patient can be educated.  Thanks, Barnie Alderman, RN, MSN, CCRN Diabetes Coordinator Inpatient Diabetes Program [phone] (Team Pager) [phone] (AP office) [phone] [facility] office)

## 2014-06-10 LAB — GLUCOSE, CAPILLARY
Glucose-Capillary: 216 mg/dL — ABNORMAL HIGH (ref 70–99)
Glucose-Capillary: 356 mg/dL — ABNORMAL HIGH (ref 70–99)
Glucose-Capillary: 311 mg/dL — ABNORMAL HIGH (ref 70–99)
Glucose-Capillary: 218 mg/dL — ABNORMAL HIGH (ref 70–99)

## 2014-06-10 LAB — BASIC METABOLIC PANEL
BUN: 46 mg/dL — ABNORMAL HIGH (ref 6–23)
CO2: 29 mEq/L (ref 19–32)
Calcium: 9.7 mg/dL (ref 8.4–10.5)
Chloride: 99 mEq/L (ref 96–112)
Creatinine, Ser: 1.33 mg/dL — ABNORMAL HIGH (ref 0.50–1.10)
GFR calc Af Amer: 44 mL/min — ABNORMAL LOW (ref >90)
GFR calc non Af Amer: 38 mL/min — ABNORMAL LOW (ref >90)
Glucose, Bld: 261 mg/dL — ABNORMAL HIGH (ref 70–99)
Potassium: 4.7 mEq/L (ref 3.7–5.3)
Sodium: 140 mEq/L (ref 137–147)

## 2014-06-10 MED ORDER — METFORMIN HCL 500 MG PO TABS
500.0000 mg | ORAL_TABLET | Freq: Every day | ORAL | Status: DC
  Administered 2014-06-10: 500 mg via ORAL
  Filled 2014-06-10: qty 1

## 2014-06-10 MED ORDER — INSULIN DETEMIR 100 UNIT/ML ~~LOC~~ SOLN
10.0000 [IU] | Freq: Every day | SUBCUTANEOUS | Status: DC
  Administered 2014-06-10 – 2014-06-11 (×2): 10 [IU] via SUBCUTANEOUS
  Filled 2014-06-10 (×3): qty 0.1

## 2014-06-10 MED ORDER — METFORMIN HCL 500 MG PO TABS
1000.0000 mg | ORAL_TABLET | Freq: Every day | ORAL | Status: DC
  Administered 2014-06-10 – 2014-06-11 (×2): 1000 mg via ORAL
  Filled 2014-06-10 (×2): qty 2

## 2014-06-10 MED ORDER — FUROSEMIDE 40 MG PO TABS
40.0000 mg | ORAL_TABLET | Freq: Every day | ORAL | Status: DC
  Administered 2014-06-11: 40 mg via ORAL
  Filled 2014-06-10: qty 1

## 2014-06-10 MED ORDER — POTASSIUM CHLORIDE CRYS ER 20 MEQ PO TBCR
20.0000 meq | EXTENDED_RELEASE_TABLET | Freq: Every day | ORAL | Status: DC
  Administered 2014-06-11: 20 meq via ORAL
  Filled 2014-06-10: qty 1

## 2014-06-10 NOTE — Progress Notes (Signed)
Subjective: The patient had a fairly comfortable night. She had not had any dyspnea. Her blood sugars remained and 200 range.  Objective: Vital signs in last 24 hours: Temp:  [97.3 F (36.3 C)-98.3 F (36.8 C)] 98.3 F (36.8 C) (06/29 2217) Pulse Rate:  [71-75] 71 (06/29 2217) Resp:  [20] 20 (06/29 2217) BP: (133-148)/(84-89) 148/89 mmHg (06/29 2217) SpO2:  [100 %] 100 % (06/29 2217) Weight change:  Last BM Date: 06/07/14  Intake/Output from previous day: 06/29 0701 - 06/30 0700 In: 840 [P.O.:840] Out: 1300 [Urine:1300] Intake/Output this shift: Total I/O In: -  Out: 650 [Urine:650]  Physical Exam: General appearance the patient is alert and oriented  HEENT negative  Neck supple no JVD or thyroid abnormalities  Lungs clear to P&A  Abdomen no palpable organs or masses  Extremities free of edema  No results found for this basename: WBC, HGB, HCT, PLT,  in the last 72 hours BMET  Recent Labs  06/08/14 0605 06/09/14 0626  NA 139 141  K 4.3 4.7  CL 99 101  CO2 25 25  GLUCOSE 274* 244*  BUN 29* 35*  CREATININE 1.11* 1.14*  CALCIUM 9.7 9.9    Studies/Results: No results found.  Medications:  . bisoprolol  5 mg Oral Daily  . enoxaparin (LOVENOX) injection  40 mg Subcutaneous Q24H  . furosemide  40 mg Intravenous Daily  . insulin aspart  0-15 Units Subcutaneous TID WC  . insulin aspart  0-5 Units Subcutaneous QHS  . lisinopril  10 mg Oral Daily  . metFORMIN  500 mg Oral BID WC  . methylPREDNISolone (SOLU-MEDROL) injection  40 mg Intravenous Q6H  . potassium chloride  20 mEq Oral BID  . sodium chloride  3 mL Intravenous Q12H        Assessment/Plan: 1. Exacerbation of asthma-no recurrence on current regimen 2 continue  2. Grade 3 diastolic and systolic congestive heart failure with ejection fraction 30-35%-plan to contact continue gradual increase in diuretic furosemide-continue to monitor chemistries, daily weights etc.  3. Diabetes mellitus new  onset-plan to continue diabetic diet, increase dose of metformin and start basal insulin Levemir 10 units daily. Continue sliding scale and short-acting insulin.   LOS: 4 days   MCINNIS,ANGUS G 06/10/2014, 6:10 AM

## 2014-06-10 NOTE — Progress Notes (Signed)
Primary cardiologist: Dr. Satira Sark  Subjective:   Eating breakfast. States she slept well, breathing has improved. No chest pain.   Objective:   Temp:  [97.3 F (36.3 C)-98.3 F (36.8 C)] 98.3 F (36.8 C) (06/30 0623) Pulse Rate:  [71-77] 77 (06/30 0623) Resp:  [20] 20 (06/30 0623) BP: (133-148)/(84-91) 142/91 mmHg (06/30 0623) SpO2:  [100 %] 100 % (06/30 0623) Weight:  [178 lb 8 oz (80.967 kg)] 178 lb 8 oz (80.967 kg) (06/30 0623) Last BM Date: 06/07/14  Filed Weights   06/08/14 0447 06/09/14 0444 06/10/14 4696  Weight: 177 lb 4 oz (80.4 kg) 175 lb 3.2 oz (79.47 kg) 178 lb 8 oz (80.967 kg)    Intake/Output Summary (Last 24 hours) at 06/10/14 0849 Last data filed at 06/10/14 0150  Gross per 24 hour  Intake    840 ml  Output   1300 ml  Net   -460 ml    Telemetry: Sinus rhythm.  Exam:  General: Appears comfortable at rest.  Lungs: Decreased breath sounds at the bases, nonlabored.  Cardiac: RRR, soft systolic murmur without gallop.  Extremities: Trace edema.   Lab Results:  Basic Metabolic Panel:  Recent Labs Lab 06/08/14 0605 06/09/14 0626 06/10/14 0629  NA 139 141 140  K 4.3 4.7 4.7  CL 99 101 99  CO2 25 25 29   GLUCOSE 274* 244* 261*  BUN 29* 35* 46*  CREATININE 1.11* 1.14* 1.33*  CALCIUM 9.7 9.9 9.7    Liver Function Tests:  Recent Labs Lab 06/06/14 1215  AST 24  ALT 16  ALKPHOS 153*  BILITOT 0.4  PROT 7.2  ALBUMIN 3.5    CBC:  Recent Labs Lab 06/06/14 1215  WBC 6.0  HGB 12.8  HCT 39.8  MCV 83.3  PLT 214    Cardiac Enzymes:  Recent Labs Lab 06/06/14 1628 06/06/14 2154 06/07/14 0555  TROPONINI <0.30 <0.30 <0.30    BNP:  Recent Labs  06/06/14 1215  PROBNP 2651.0*     Medications:   Scheduled Medications: . bisoprolol  5 mg Oral Daily  . enoxaparin (LOVENOX) injection  40 mg Subcutaneous Q24H  . furosemide  40 mg Intravenous Daily  . insulin aspart  0-15 Units Subcutaneous TID WC  . insulin  aspart  0-5 Units Subcutaneous QHS  . insulin detemir  10 Units Subcutaneous Daily  . lisinopril  10 mg Oral Daily  . metFORMIN  1,000 mg Oral Q breakfast  . metFORMIN  500 mg Oral Q supper  . methylPREDNISolone (SOLU-MEDROL) injection  40 mg Intravenous Q6H  . potassium chloride  20 mEq Oral BID  . sodium chloride  3 mL Intravenous Q12H      PRN Medications:  sodium chloride, acetaminophen, albuterol, ondansetron (ZOFRAN) IV, sodium chloride   Assessment:   1. Acute on chronic combined heart failure, LVEF 30-35% with grade 3 diastolic dysfunction by recent assessment. Clinically improving with diuresis and medication adjustments.  2. History of nonischemic cardiomyopathy with nonobstructive CAD.  3. Mild renal insufficiency with IV diuresis, creatinine of 1.3.  4. Type 2 diabetes mellitus.  5. Mitral regurgitation, graded as mild by echocardiogram at [facility] in May, and then moderate to severe by echocardiogram done at [facility] in June. Need to review most recent study.   Plan/Discussion:    Plan to continue bisoprolol, change Lasix 40 mg oral daily, continue potassium supplements and ACE inhibitor. She should ambulate in the hall today. BMET in the morning. Possible discharge  home tomorrow.   Satira Sark, M.D., F.A.C.C.

## 2014-06-11 DIAGNOSIS — I059 Rheumatic mitral valve disease, unspecified: Secondary | ICD-10-CM

## 2014-06-11 LAB — BASIC METABOLIC PANEL
BUN: 25 mg/dL — ABNORMAL HIGH (ref 6–23)
CO2: 44 mEq/L (ref 19–32)
Calcium: 9.6 mg/dL (ref 8.4–10.5)
Chloride: 93 mEq/L — ABNORMAL LOW (ref 96–112)
Creatinine, Ser: 0.82 mg/dL (ref 0.50–1.10)
GFR calc Af Amer: 79 mL/min — ABNORMAL LOW (ref >90)
GFR calc non Af Amer: 68 mL/min — ABNORMAL LOW (ref >90)
Glucose, Bld: 116 mg/dL — ABNORMAL HIGH (ref 70–99)
Potassium: 4.2 mEq/L (ref 3.7–5.3)
Sodium: 141 mEq/L (ref 137–147)

## 2014-06-11 LAB — GLUCOSE, CAPILLARY
Glucose-Capillary: 227 mg/dL — ABNORMAL HIGH (ref 70–99)
Glucose-Capillary: 237 mg/dL — ABNORMAL HIGH (ref 70–99)

## 2014-06-11 MED ORDER — POTASSIUM CHLORIDE CRYS ER 20 MEQ PO TBCR: 20.0000 meq | EXTENDED_RELEASE_TABLET | Freq: Every day | ORAL | Status: DC

## 2014-06-11 MED ORDER — BISOPROLOL FUMARATE 5 MG PO TABS: 5.0000 mg | ORAL_TABLET | Freq: Every day | ORAL | Status: DC

## 2014-06-11 MED ORDER — INSULIN STARTER KIT- PEN NEEDLES (ENGLISH)
1.0000 | Freq: Once | Status: AC
  Administered 2014-06-11: 1
  Filled 2014-06-11: qty 1

## 2014-06-11 MED ORDER — METFORMIN HCL 1000 MG PO TABS: 500.0000 mg | ORAL_TABLET | Freq: Two times a day (BID) | ORAL | Status: DC

## 2014-06-11 NOTE — Plan of Care (Signed)
Problem: Discharge Progression Outcomes Goal: Pain controlled with appropriate interventions Outcome: Completed/Met Date Met:  06/11/14 denies  Comments:  Pt given exit care information on Diabetic diet What to eat what should be avoided

## 2014-06-11 NOTE — Discharge Summary (Signed)
Physician Discharge Summary  [name] HRC:[id number] DOB: [birth year] DOA: 06/06/2014  PCP: Lanette Hampshire, MD  Admit date: 06/06/2014 Discharge date: 06/11/2014     Discharge Diagnoses:  1. Acute on chronic systolic and diastolic congestive heart failure 2. Nonischemic cardiomyopathy 3.  mild renal insufficiency stage II 4. Diabetes mellitus type 2 5. History bronchial asthma  Discharge Condition: Good Disposition: Home  Diet recommendation: Low sodium 2000-calorie ADA diet  Filed Weights   06/08/14 0447 06/09/14 0444 06/10/14 0623  Weight: 80.4 kg (177 lb 4 oz) 79.47 kg (175 lb 3.2 oz) 80.967 kg (178 lb 8 oz)    History of present illness:  The patient has a history of asthma nonischemic cardiomyopathy presented to the emergency room with shortness of breath and was felt to have exacerbation of congestive heart failure. She had some lower extremity edema chest x-ray on admission showed evidence of cardiomegaly and mild interstitial edema patient also does have new-onset diabetes. Examination on admission revealed evidence of patient with increased respiratory effort. She did have evidence of mild peripheral edema.  Hospital Course:  The patient gradually improve during hospital stay his placed on IV furosemide. She was also noted to have elevated blood sugar she was started on metformin 500 mg twice a day she was continued on short-acting insulin will start on baseline soon as well. Her blood sugars began to come down some. She did have slightly low serum potassium this was repleted with potassium chloride. The patient was continued on the following medications as listed below. She is seen in consultation by cardiology. It was noted she had acute on chronic combined heart failure LVEF 30-35% and grade 3 diastolic dysfunction by recent assessment. Was recommended she continue Lasix 40 mg potassium supplement and ACE inhibitor Discharge Instructions The patient will be discharged  home on the following medications. She was instructed to have family member come by primary care physician's office for samples of insulin pens. Also patient is to return physician's office in one week   Medication List         albuterol 108 (90 BASE) MCG/ACT inhaler  Commonly known as:  PROVENTIL HFA;VENTOLIN HFA  Inhale 2 puffs into the lungs every 4 (four) hours as needed for wheezing or shortness of breath.     bisoprolol 5 MG tablet  Commonly known as:  ZEBETA  Take 1 tablet (5 mg total) by mouth daily.     furosemide 40 MG tablet  Commonly known as:  LASIX  Take 20 mg by mouth daily.     lisinopril 5 MG tablet  Commonly known as:  PRINIVIL,ZESTRIL  Take 5 mg by mouth daily.     metFORMIN 1000 MG tablet  Commonly known as:  GLUCOPHAGE  Take 0.5 tablets (500 mg total) by mouth 2 (two) times daily with a meal.     potassium chloride SA 20 MEQ tablet  Commonly known as:  K-DUR,KLOR-CON  Take 1 tablet (20 mEq total) by mouth daily.       No Known Allergies  The results of significant diagnostics from this hospitalization (including imaging, microbiology, ancillary and laboratory) are listed below for reference.    Significant Diagnostic Studies: Dg Chest Portable 1 View  06/06/2014   CLINICAL DATA:  Shortness of breath and cough.  EXAM: PORTABLE CHEST - 1 VIEW  COMPARISON:  05/09/2014  FINDINGS: The cardiac silhouette remains enlarged, unchanged. Pulmonary vascular congestion and interstitial opacities consistent with edema are stable to minimally improved from the prior  study. No definite pleural effusion or pneumothorax is identified. No acute osseous abnormality is seen.  IMPRESSION: Cardiomegaly and interstitial edema, stable to minimally improved from the prior study.   Electronically Signed   By: Logan Bores   On: 06/06/2014 13:15    Microbiology: No results found for this or any previous visit (from the past 240 hour(s)).   Labs: Basic Metabolic Panel:  Recent  Labs Lab 06/06/14 1215 06/07/14 0555 06/08/14 0605 06/09/14 0626 06/10/14 0629  NA 145 143 139 141 140  K 3.5* 3.6* 4.3 4.7 4.7  CL 107 100 99 101 99  CO2 25 21 25 25 29   GLUCOSE 139* 335* 274* 244* 261*  BUN 12 17 29* 35* 46*  CREATININE 1.04 1.04 1.11* 1.14* 1.33*  CALCIUM 9.7 9.7 9.7 9.9 9.7   Liver Function Tests:  Recent Labs Lab 06/06/14 1215  AST 24  ALT 16  ALKPHOS 153*  BILITOT 0.4  PROT 7.2  ALBUMIN 3.5   No results found for this basename: LIPASE, AMYLASE,  in the last 168 hours No results found for this basename: AMMONIA,  in the last 168 hours CBC:  Recent Labs Lab 06/06/14 1215  WBC 6.0  NEUTROABS 3.7  HGB 12.8  HCT 39.8  MCV 83.3  PLT 214   Cardiac Enzymes:  Recent Labs Lab 06/06/14 1215 06/06/14 1628 06/06/14 2154 06/07/14 0555  TROPONINI <0.30 <0.30 <0.30 <0.30   BNP: BNP (last 3 results)  Recent Labs  06/06/14 1215  PROBNP 2651.0*   CBG:  Recent Labs Lab 06/09/14 2215 06/10/14 0726 06/10/14 1105 06/10/14 1622 06/10/14 2108  GLUCAP 227* 216* 356* 311* 218*    Principal Problem:   Asthma exacerbation Active Problems:   CARDIOMYOPATHY   Dyspnea   Acute on chronic combined systolic and diastolic CHF (congestive heart failure)   Mitral regurgitation   Time coordinating discharge:  45 minutes  Signed:  Marjean Donna, MD 06/11/2014, 6:07 AM

## 2014-06-11 NOTE — Progress Notes (Signed)
Subjective:  Ready for discharge. Breathing improved.  Objective:  Vital Signs in the last 24 hours: Temp:  [97.9 F (36.6 C)-98 F (36.7 C)] 97.9 F (36.6 C) (07/01 0500) Pulse Rate:  [68-81] 70 (07/01 0500) Resp:  [16-20] 18 (07/01 0500) BP: (132-142)/(82-89) 142/85 mmHg (07/01 0500) SpO2:  [94 %-96 %] 94 % (07/01 0500) Weight:  [179 lb 10.8 oz (81.5 kg)] 179 lb 10.8 oz (81.5 kg) (07/01 0500)  Intake/Output from previous day: 06/30 0701 - 07/01 0700 In: 720 [P.O.:720] Out: 1400 [Urine:1400] Intake/Output from this shift:    Physical Exam: NECK: Without JVD, HJR, or bruit LUNGS: Decreased Breath sounds with crackles at the bases, otherwise clear. HEART: Regular rate and rhythm, positive S4, 2/6 systolic murmur LSB, no rub, bruit, thrill, or heave EXTREMITIES: Without cyanosis, clubbing, or edema   Lab Results:  Recent Labs  06/10/14 0629 06/11/14 0553  NA 140 141  K 4.7 4.2  CL 99 93*  CO2 29 44*  GLUCOSE 261* 116*  BUN 46* 25*  CREATININE 1.33* 0.82    Cardiac Studies: 2Decho:06/07/14: Study Conclusions  - Left ventricle: The cavity size was normal. Wall thickness was   increased in a pattern of moderate to severe LVH. Systolic   function was moderately to severely reduced. The estimated   ejection fraction was in the range of 30% to 35%. Severe   hypokinesis of the entirelateral, inferolateral, and inferior   myocardium. Doppler parameters are consistent with a reversible   restrictive pattern, indicative of decreased left ventricular   diastolic compliance and/or increased left atrial pressure (grade   3 diastolic dysfunction). - Mitral valve: There was moderate to severe regurgitation. - Left atrium: The atrium was moderately dilated. - Pulmonary arteries: Systolic pressure was moderately increased.   Assessment/Plan:    1. Acute on chronic combined heart failure, LVEF 30-35% with grade 3 diastolic dysfunction by recent assessment. Clinically  improving with diuresis and medication adjustments. I/O's negative 680 cc. OK for discharge. Will arrange f/u with Dr. Domenic Polite.  2. History of nonischemic cardiomyopathy with nonobstructive CAD.  3. Mild renal insufficiency with IV diuresis, creatinine of 1.3 yesterday, now improved 0.8 today.  4. Type 2 diabetes mellitus.  5. Mitral regurgitation, moderate-severe.     Ermalinda Barrios 06/11/2014, 8:53 AM   Attending note:  Patient seen and examined. Agree with above assessment by Ms. Bonnell Public PA-C. Patient has improved clinically with adjustments in medical therapy including diuresis. Creatinine improved to 0.8. I reviewed her echocardiogram done at [facility], mitral regurgitation is moderate overall. Diastolic dysfunction is grade 2. Plan to continue current regimen, she is being discharged home today. Office followup will be arranged.  Satira Sark, M.D., F.A.C.C.

## 2014-06-11 NOTE — Progress Notes (Signed)
CRITICAL VALUE ALERT  Critical value received:  CO2 44  Date of notification:  06/11/14  Time of notification:  0652  Critical value read back:Yes.    Nurse who received alert:  Donnella Bi, RN  MD notified (1st page):  [place]  Time of first page:  [phone]  Responding MD:  Everette Rank  Time MD responded:  [id number]  No new orders at this time.

## 2014-06-18 ENCOUNTER — Encounter (HOSPITAL_COMMUNITY): Payer: Self-pay | Admitting: Emergency Medicine

## 2014-06-18 ENCOUNTER — Observation Stay (HOSPITAL_COMMUNITY): Payer: PRIVATE HEALTH INSURANCE

## 2014-06-18 ENCOUNTER — Observation Stay (HOSPITAL_COMMUNITY)
Admission: EM | Admit: 2014-06-18 | Discharge: 2014-06-19 | Disposition: A | Discharge status: Home / Self Care | Payer: PRIVATE HEALTH INSURANCE | Attending: Family Medicine | Admitting: Family Medicine

## 2014-06-18 DIAGNOSIS — I2510 Atherosclerotic heart disease of native coronary artery without angina pectoris: Secondary | ICD-10-CM | POA: Insufficient documentation

## 2014-06-18 DIAGNOSIS — I10 Essential (primary) hypertension: Secondary | ICD-10-CM | POA: Diagnosis not present

## 2014-06-18 DIAGNOSIS — I428 Other cardiomyopathies: Secondary | ICD-10-CM | POA: Diagnosis present

## 2014-06-18 DIAGNOSIS — R631 Polydipsia: Secondary | ICD-10-CM | POA: Insufficient documentation

## 2014-06-18 DIAGNOSIS — M109 Gout, unspecified: Secondary | ICD-10-CM | POA: Diagnosis not present

## 2014-06-18 DIAGNOSIS — N289 Disorder of kidney and ureter, unspecified: Principal | ICD-10-CM | POA: Insufficient documentation

## 2014-06-18 DIAGNOSIS — N179 Acute kidney failure, unspecified: Secondary | ICD-10-CM | POA: Diagnosis present

## 2014-06-18 DIAGNOSIS — J45909 Unspecified asthma, uncomplicated: Secondary | ICD-10-CM | POA: Diagnosis not present

## 2014-06-18 DIAGNOSIS — R3589 Other polyuria: Secondary | ICD-10-CM | POA: Insufficient documentation

## 2014-06-18 DIAGNOSIS — I779 Disorder of arteries and arterioles, unspecified: Secondary | ICD-10-CM | POA: Diagnosis not present

## 2014-06-18 DIAGNOSIS — E785 Hyperlipidemia, unspecified: Secondary | ICD-10-CM

## 2014-06-18 DIAGNOSIS — E039 Hypothyroidism, unspecified: Secondary | ICD-10-CM | POA: Diagnosis not present

## 2014-06-18 DIAGNOSIS — Z8673 Personal history of transient ischemic attack (TIA), and cerebral infarction without residual deficits: Secondary | ICD-10-CM | POA: Diagnosis not present

## 2014-06-18 DIAGNOSIS — R358 Other polyuria: Secondary | ICD-10-CM | POA: Diagnosis not present

## 2014-06-18 DIAGNOSIS — Z79899 Other long term (current) drug therapy: Secondary | ICD-10-CM | POA: Insufficient documentation

## 2014-06-18 DIAGNOSIS — I701 Atherosclerosis of renal artery: Secondary | ICD-10-CM | POA: Diagnosis not present

## 2014-06-18 DIAGNOSIS — I059 Rheumatic mitral valve disease, unspecified: Secondary | ICD-10-CM

## 2014-06-18 DIAGNOSIS — R42 Dizziness and giddiness: Secondary | ICD-10-CM | POA: Diagnosis present

## 2014-06-18 DIAGNOSIS — I340 Nonrheumatic mitral (valve) insufficiency: Secondary | ICD-10-CM | POA: Diagnosis present

## 2014-06-18 DIAGNOSIS — E1169 Type 2 diabetes mellitus with other specified complication: Secondary | ICD-10-CM | POA: Diagnosis present

## 2014-06-18 LAB — CBC WITH DIFFERENTIAL/PLATELET
Basophils Absolute: 0 10*3/uL (ref 0.0–0.1)
Basophils Relative: 0 % (ref 0–1)
Eosinophils Absolute: 0.1 10*3/uL (ref 0.0–0.7)
Eosinophils Relative: 1 % (ref 0–5)
HCT: 43.5 % (ref 36.0–46.0)
Hemoglobin: 13.8 g/dL (ref 12.0–15.0)
Lymphocytes Relative: 32 % (ref 12–46)
Lymphs Abs: 2 10*3/uL (ref 0.7–4.0)
MCH: 26.4 pg (ref 26.0–34.0)
MCHC: 31.7 g/dL (ref 30.0–36.0)
MCV: 83.3 fL (ref 78.0–100.0)
Monocytes Absolute: 0.5 10*3/uL (ref 0.1–1.0)
Monocytes Relative: 8 % (ref 3–12)
Neutro Abs: 3.6 10*3/uL (ref 1.7–7.7)
Neutrophils Relative %: 59 % (ref 43–77)
Platelets: ADEQUATE 10*3/uL (ref 150–400)
RBC: 5.22 MIL/uL — ABNORMAL HIGH (ref 3.87–5.11)
RDW: 15.2 % (ref 11.5–15.5)
WBC: 6.2 10*3/uL (ref 4.0–10.5)

## 2014-06-18 LAB — BASIC METABOLIC PANEL
Anion gap: 14 (ref 5–15)
BUN: 23 mg/dL (ref 6–23)
CO2: 25 mEq/L (ref 19–32)
Calcium: 9.6 mg/dL (ref 8.4–10.5)
Chloride: 104 mEq/L (ref 96–112)
Creatinine, Ser: 1.92 mg/dL — ABNORMAL HIGH (ref 0.50–1.10)
GFR calc Af Amer: 28 mL/min — ABNORMAL LOW (ref >90)
GFR calc non Af Amer: 24 mL/min — ABNORMAL LOW (ref >90)
Glucose, Bld: 154 mg/dL — ABNORMAL HIGH (ref 70–99)
Potassium: 4.7 mEq/L (ref 3.7–5.3)
Sodium: 143 mEq/L (ref 137–147)

## 2014-06-18 LAB — GLUCOSE, CAPILLARY: Glucose-Capillary: 242 mg/dL — ABNORMAL HIGH (ref 70–99)

## 2014-06-18 LAB — CBG MONITORING, ED: Glucose-Capillary: 177 mg/dL — ABNORMAL HIGH (ref 70–99)

## 2014-06-18 MED ORDER — ALBUTEROL SULFATE (2.5 MG/3ML) 0.083% IN NEBU: 2.5000 mg | INHALATION_SOLUTION | RESPIRATORY_TRACT | Status: DC | PRN

## 2014-06-18 MED ORDER — ONDANSETRON HCL 4 MG PO TABS: 4.0000 mg | ORAL_TABLET | Freq: Four times a day (QID) | ORAL | Status: DC | PRN

## 2014-06-18 MED ORDER — SODIUM CHLORIDE 0.9 % IJ SOLN
3.0000 mL | Freq: Two times a day (BID) | INTRAMUSCULAR | Status: DC
  Administered 2014-06-18 – 2014-06-19 (×2): 3 mL via INTRAVENOUS

## 2014-06-18 MED ORDER — GUAIFENESIN-DM 100-10 MG/5ML PO SYRP: 5.0000 mL | ORAL_SOLUTION | ORAL | Status: DC | PRN

## 2014-06-18 MED ORDER — ACETAMINOPHEN 325 MG PO TABS: 650.0000 mg | ORAL_TABLET | Freq: Four times a day (QID) | ORAL | Status: DC | PRN

## 2014-06-18 MED ORDER — ONDANSETRON HCL 4 MG/2ML IJ SOLN: 4.0000 mg | Freq: Four times a day (QID) | INTRAMUSCULAR | Status: DC | PRN

## 2014-06-18 MED ORDER — HEPARIN SODIUM (PORCINE) 5000 UNIT/ML IJ SOLN
5000.0000 [IU] | Freq: Three times a day (TID) | INTRAMUSCULAR | Status: DC
Start: 2014-06-18 — End: 2014-06-19
  Administered 2014-06-18 – 2014-06-19 (×3): 5000 [IU] via SUBCUTANEOUS
  Filled 2014-06-18 (×3): qty 1

## 2014-06-18 MED ORDER — ACETAMINOPHEN 650 MG RE SUPP: 650.0000 mg | Freq: Four times a day (QID) | RECTAL | Status: DC | PRN

## 2014-06-18 MED ORDER — INSULIN ASPART 100 UNIT/ML ~~LOC~~ SOLN
0.0000 [IU] | Freq: Three times a day (TID) | SUBCUTANEOUS | Status: DC
  Administered 2014-06-19 (×3): 1 [IU] via SUBCUTANEOUS

## 2014-06-18 MED ORDER — SODIUM CHLORIDE 0.9 % IV SOLN
INTRAVENOUS | Status: AC
  Administered 2014-06-18: 20:00:00 via INTRAVENOUS

## 2014-06-18 MED ORDER — BISOPROLOL FUMARATE 5 MG PO TABS
5.0000 mg | ORAL_TABLET | Freq: Every day | ORAL | Status: DC
  Administered 2014-06-19: 5 mg via ORAL
  Filled 2014-06-18 (×3): qty 1

## 2014-06-18 NOTE — ED Provider Notes (Signed)
CSN: [id number]     Arrival date & time 06/18/14  1306 History  This chart was scribed for Katie Jacobsen, MD by Roxan Diesel, ED scribe.  This patient was seen in room APA15/[facility]15 and the patient's care was started at 1:26 PM.   Chief Complaint  Patient presents with  . Dizziness     The history is provided by the patient. No language interpreter was used.   HPI Comments: [name] is a 78 y.o. female with h/o DM (per pt) who presents to the Emergency Department complaining of intermittent "dizzy spells" that have been ongoing for the past week.  Pt describes these spells as episodes of dizziness which last for several seconds.  They are not brought on by anything in particular and resolve on their own.  She denies associated headache, CP, SOB, focal weakness, fever, or vomiting.  Pt states she has diabetes and she came to get her sugar checked because she has no way of checking at home.  She feels her episodes are caused by high blood sugar and also reports polydipsia and polyuria.  She does not have a machine to check her sugar at home.  She has an appointment with her PCP tomorrow.     Past Medical History  Diagnosis Date  . Nonischemic cardiomyopathy     LVEF 20-25%  . Asthma   . Cerebrovascular disease     TIA and stroke   . Essential hypertension, benign   . Coronary atherosclerosis of native coronary artery     Nonobstructive at cath 2005  . Hypothyroidism   . Carotid artery disease   . Gout   . Renal artery stenosis     Bilateral  . Bronchitis     Past Surgical History  Procedure Laterality Date  . Abdominal hysterectomy      Family History  Problem Relation Age of Onset  . Asthma Son     History  Substance Use Topics  . Smoking status: Never Smoker   . Smokeless tobacco: Never Used  . Alcohol Use: No    OB History   Grav Para Term Preterm Abortions TAB SAB Ect Mult Living                   Review of Systems  Constitutional: Negative for  fever.  Respiratory: Negative for shortness of breath.   Cardiovascular: Negative for chest pain.  Gastrointestinal: Negative for vomiting.  Endocrine: Positive for polydipsia and polyuria.  Neurological: Positive for dizziness. Negative for headaches.  All other systems reviewed and are negative.     Allergies  Review of patient's allergies indicates no known allergies.  Home Medications   Prior to Admission medications   Medication Sig Start Date End Date Taking? Authorizing Provider  albuterol (PROVENTIL HFA;VENTOLIN HFA) 108 (90 BASE) MCG/ACT inhaler Inhale 2 puffs into the lungs every 4 (four) hours as needed for wheezing or shortness of breath.    Historical Provider, MD  bisoprolol (ZEBETA) 5 MG tablet Take 1 tablet (5 mg total) by mouth daily. 06/11/14   Angus Ailene Ravel, MD  furosemide (LASIX) 40 MG tablet Take 20 mg by mouth daily.    Historical Provider, MD  lisinopril (PRINIVIL,ZESTRIL) 5 MG tablet Take 5 mg by mouth daily.    Historical Provider, MD  metFORMIN (GLUCOPHAGE) 1000 MG tablet Take 0.5 tablets (500 mg total) by mouth 2 (two) times daily with a meal. 06/11/14   Angus Ailene Ravel, MD  potassium chloride SA (  K-DUR,KLOR-CON) 20 MEQ tablet Take 1 tablet (20 mEq total) by mouth daily. 06/11/14   Angus Ailene Ravel, MD   BP 136/84  Pulse 69  Temp(Src) 98.2 F (36.8 C) (Oral)  Resp 16  Ht 5\' 3"  (1.6 m)  Wt 175 lb (79.379 kg)  BMI 31.01 kg/m2  SpO2 95%  Physical Exam  Nursing note and vitals reviewed. Constitutional: She is oriented to person, place, and time. She appears well-developed and well-nourished.  Non-toxic appearance. No distress.  HENT:  Head: Normocephalic and atraumatic.  Eyes: Conjunctivae, EOM and lids are normal. Pupils are equal, round, and reactive to light.  Neck: Normal range of motion. Neck supple. No tracheal deviation present. No mass present.  Cardiovascular: Normal rate, regular rhythm and normal heart sounds.  Exam reveals no gallop.   No  murmur heard. Pulmonary/Chest: Effort normal and breath sounds normal. No stridor. No respiratory distress. She has no decreased breath sounds. She has no wheezes. She has no rhonchi. She has no rales.  Abdominal: Soft. Normal appearance and bowel sounds are normal. She exhibits no distension. There is no tenderness. There is no rebound and no CVA tenderness.  Musculoskeletal: Normal range of motion. She exhibits no edema and no tenderness.  Neurological: She is alert and oriented to person, place, and time. She has normal strength. No cranial nerve deficit or sensory deficit. Coordination normal. GCS eye subscore is 4. GCS verbal subscore is 5. GCS motor subscore is 6.  Skin: Skin is warm and dry. No abrasion and no rash noted.  Psychiatric: She has a normal mood and affect. Her speech is normal and behavior is normal.    ED Course  Procedures (including critical care time)  DIAGNOSTIC STUDIES: Oxygen Saturation is 95% on room air, adequate by my interpretation.    COORDINATION OF CARE: 1:30 PM-Discussed treatment plan which includes EKG and labs with pt at bedside and pt agreed to plan.    Labs Review Labs Reviewed - No data to display  Imaging Review No results found.   EKG Interpretation None      MDM   Final diagnoses:  None    I personally performed the services described in this documentation, which was scribed in my presence. The recorded information has been reviewed and is accurate.  3:28 PM Patient with worsening renal function and will be given IV fluids. Suspect this is from the combination of her diuretic and ACE inhibitor. Spoke with her primary care Dr. who agrees with recommendation for admission for observation.  Katie Jacobsen, MD 06/18/14 [phone]

## 2014-06-18 NOTE — ED Notes (Signed)
Pt reports has been having "dizzy spells" recently and wants to have her sugar checked.  Pt says is diabetic but doesn't have anything to check her sugar with.

## 2014-06-18 NOTE — H&P (Addendum)
PATIENT DETAILS Name: [name] Age: 78 y.o. Sex: female Date of Birth: [birth year] Admit Date: 06/18/2014 PPJ:[id number],IWPYK G, MD   CHIEF COMPLAINT:  Lightheadedness since yesterday  HPI: [name] is a 78 y.o. female with a Past Medical History of nonischemic cardiomyopathy, diabetes, hypertension who presents today with the above noted complaint. Per patient, she was in her usual state of health, started developing "dizziness" yesterday afternoon. Patient describes the dizziness as more of lightheadedness, denies any vertigo-like sensation. There is no associated nausea, vomiting. There is also no associated chest pain or palpitations. Patient denies any recent fever or diarrhea. She denies any recent change in her medications since her discharge from the hospital a week ago. She came to the hospital to be evaluated for the above noted reasons, and was found to be in acute renal failure. I was asked to admit this patient for further evaluation and treatment.  ALLERGIES:  No Known Allergies  PAST MEDICAL HISTORY: Past Medical History  Diagnosis Date  . Nonischemic cardiomyopathy     LVEF 20-25%  . Asthma   . Cerebrovascular disease     TIA and stroke   . Essential hypertension, benign   . Coronary atherosclerosis of native coronary artery     Nonobstructive at cath 2005  . Hypothyroidism   . Carotid artery disease   . Gout   . Renal artery stenosis     Bilateral  . Bronchitis     PAST SURGICAL HISTORY: Past Surgical History  Procedure Laterality Date  . Abdominal hysterectomy      MEDICATIONS AT HOME: Prior to Admission medications   Medication Sig Start Date End Date Taking? Authorizing Provider  albuterol (PROVENTIL HFA;VENTOLIN HFA) 108 (90 BASE) MCG/ACT inhaler Inhale 2 puffs into the lungs every 4 (four) hours as needed for wheezing or shortness of breath.    Historical Provider, MD  bisoprolol (ZEBETA) 5 MG tablet Take 1 tablet (5 mg total) by  mouth daily. 06/11/14   Angus Ailene Ravel, MD  furosemide (LASIX) 40 MG tablet Take 20 mg by mouth daily.    Historical Provider, MD  lisinopril (PRINIVIL,ZESTRIL) 5 MG tablet Take 5 mg by mouth daily.    Historical Provider, MD  metFORMIN (GLUCOPHAGE) 1000 MG tablet Take 0.5 tablets (500 mg total) by mouth 2 (two) times daily with a meal. 06/11/14   Angus Ailene Ravel, MD  potassium chloride SA (K-DUR,KLOR-CON) 20 MEQ tablet Take 1 tablet (20 mEq total) by mouth daily. 06/11/14   Lanette Hampshire, MD    FAMILY HISTORY: Family History  Problem Relation Age of Onset  . Asthma Son     SOCIAL HISTORY:  reports that she has never smoked. She has never used smokeless tobacco. She reports that she does not drink alcohol or use illicit drugs.  REVIEW OF SYSTEMS:  Constitutional:   No  weight loss, night sweats,  Fevers, chills, fatigue.  HEENT:    No headaches, Difficulty swallowing,Tooth/dental problems,Sore throat,  No sneezing, itching, ear ache, nasal congestion, post nasal drip,   Cardio-vascular: No chest pain,  Orthopnea, PND, swelling in lower extremities, anasarca, palpitations  GI:  No heartburn, indigestion, abdominal pain, nausea, vomiting, diarrhea, change in bowel habits, loss of appetite  Resp: No shortness of breath with exertion or at rest.  No excess mucus, no productive cough, No non-productive cough,  No coughing up of blood.No change in color of mucus.No wheezing.No chest wall deformity  Skin:  no rash or lesions.  GU:  no dysuria, change in color of urine, no urgency or frequency.  No flank pain.  Musculoskeletal: No joint pain or swelling.  No decreased range of motion.  No back pain.  Psych: No change in mood or affect. No depression or anxiety.  No memory loss.   PHYSICAL EXAM: Blood pressure 134/88, pulse 58, temperature 98.2 F (36.8 C), temperature source Oral, resp. rate 18, height 5\' 3"  (1.6 m), weight 79.379 kg (175 lb), SpO2 97.00%.  General appearance  :Awake, alert, not in any distress. Speech Clear. Not toxic Looking HEENT: Atraumatic and Normocephalic, pupils equally reactive to light and accomodation Neck: supple, no JVD. No cervical lymphadenopathy.  Chest:Good air entry bilaterally, no added sounds  CVS: S1 S2 regular, no murmurs.  Abdomen: Bowel sounds present, Non tender and not distended with no gaurding, rigidity or rebound. Extremities: B/L Lower Ext shows no edema, both legs are warm to touch Neurology: Awake alert, and oriented X 3, Non focal Skin:No Rash Wounds:N/A  LABS ON ADMISSION:   Recent Labs  06/18/14 1338  NA 143  K 4.7  CL 104  CO2 25  GLUCOSE 154*  BUN 23  CREATININE 1.92*  CALCIUM 9.6   No results found for this basename: AST, ALT, ALKPHOS, BILITOT, PROT, ALBUMIN,  in the last 72 hours No results found for this basename: LIPASE, AMYLASE,  in the last 72 hours  Recent Labs  06/18/14 1338  WBC 6.2  NEUTROABS 3.6  HGB 13.8  HCT 43.5  MCV 83.3  PLT PLATELET CLUMPS NOTED ON SMEAR, COUNT APPEARS ADEQUATE   No results found for this basename: CKTOTAL, CKMB, CKMBINDEX, TROPONINI,  in the last 72 hours No results found for this basename: DDIMER,  in the last 72 hours No components found with this basename: POCBNP,    RADIOLOGIC STUDIES ON ADMISSION: No results found.   EKG: Independently reviewed. NSR  ASSESSMENT AND PLAN: Present on Admission:  . ARF (acute renal failure) - Suspect this to be prerenal, patient on diuretic therapy as well as an lisinopril. Does not look very dry clinically.  - Will admit, cautiously start on IV fluids-50 cc an hour for the next 6 hours, hold Lasix in the Center for now. Repeat chemistries in a.m. If still persistent renal failure, will require nephrology evaluation and further workup.   . Chronic systolic heart failure-history of nonischemic cardiomyopathy - Currently clinically compensated. Last EF on 06/07/14 was 30-35% - Given acute renal failure, holding  Lasix and lisinopril, to evaluate post IV fluid hydration tomorrow.  . Essential hypertension, benign - Holding Lasix and lisinopril, continue cautiously with Coreg.   . Mitral regurgitation - Defer further evaluation to the outpatient setting.   Further plan will depend as patient's clinical course evolves and further radiologic and laboratory data become available. Patient will be monitored closely.  Above noted plan was discussed with patient, she was in agreement.   DVT Prophylaxis: Prophylactic  Heparin  Code Status: Full Code  Total time spent for admission equals 45 minutes.  [facility] Pager [phone]  If 7PM-7AM, please contact night-coverage [email or web address] Password [email or web address] 06/18/2014, 3:51 PM  **Disclaimer: This note may have been dictated with voice recognition software. Similar sounding words can inadvertently be transcribed and this note may contain transcription errors which may not have been corrected upon publication of note.**

## 2014-06-18 NOTE — ED Notes (Signed)
Report given to Compo on 3A for rm [id number].

## 2014-06-19 DIAGNOSIS — N289 Disorder of kidney and ureter, unspecified: Secondary | ICD-10-CM | POA: Diagnosis not present

## 2014-06-19 LAB — CBC
HCT: 40.7 % (ref 36.0–46.0)
Hemoglobin: 13 g/dL (ref 12.0–15.0)
MCH: 26.7 pg (ref 26.0–34.0)
MCHC: 31.9 g/dL (ref 30.0–36.0)
MCV: 83.6 fL (ref 78.0–100.0)
Platelets: 212 10*3/uL (ref 150–400)
RBC: 4.87 MIL/uL (ref 3.87–5.11)
RDW: 15 % (ref 11.5–15.5)
WBC: 5.2 10*3/uL (ref 4.0–10.5)

## 2014-06-19 LAB — BASIC METABOLIC PANEL
Anion gap: 13 (ref 5–15)
BUN: 19 mg/dL (ref 6–23)
CO2: 23 mEq/L (ref 19–32)
Calcium: 9 mg/dL (ref 8.4–10.5)
Chloride: 105 mEq/L (ref 96–112)
Creatinine, Ser: 1.13 mg/dL — ABNORMAL HIGH (ref 0.50–1.10)
GFR calc Af Amer: 54 mL/min — ABNORMAL LOW (ref >90)
GFR calc non Af Amer: 46 mL/min — ABNORMAL LOW (ref >90)
Glucose, Bld: 142 mg/dL — ABNORMAL HIGH (ref 70–99)
Potassium: 4.2 mEq/L (ref 3.7–5.3)
Sodium: 141 mEq/L (ref 137–147)

## 2014-06-19 LAB — GLUCOSE, CAPILLARY
Glucose-Capillary: 136 mg/dL — ABNORMAL HIGH (ref 70–99)
Glucose-Capillary: 141 mg/dL — ABNORMAL HIGH (ref 70–99)
Glucose-Capillary: 126 mg/dL — ABNORMAL HIGH (ref 70–99)

## 2014-06-19 NOTE — Progress Notes (Signed)
UR Completed Laquanna Veazey Graves-Bigelow, RN,BSN [phone]  

## 2014-06-19 NOTE — Progress Notes (Signed)
Pt discharged home today per Dr. Everette Rank. Pt's IV site D/C'd and WNL. Pt's VSS. Pt provided with home medication list and discharge instructions. Verbalized understanding. Pt left floor via WC in stable condition accompanied by NT.

## 2014-06-19 NOTE — Discharge Summary (Signed)
Physician Discharge Summary  [name] XID:[id number] DOB: [birth year] DOA: 06/18/2014  PCP: Lanette Hampshire, MD  Admit date: 06/18/2014 Discharge date: 06/19/2014     Discharge Diagnoses:  1. Acute renal failure chronic renal failure stage II 2. Chronic systolic congestive heart failure with nonischemic cardiomyopathy 3. Bronchial asthma 4. Diabetes mellitus type 2 5. Essential hypertension  Discharge Condition: Stable Disposition: Home  Diet recommendation: 2000-calorie diabetic 2 g sodium diet  Filed Weights   06/18/14 1308 06/18/14 1900 06/19/14 0700  Weight: 79.379 kg (175 lb) 74.2 kg (163 lb 9.3 oz) 75.3 kg (166 lb 0.1 oz)    History of present illness:  The patient has a history of nonischemic cardiomyopathy and congestive heart failure diabetes hypertension. She presented to the ED with feeling of lightheadedness. Chest x-ray showed evidence of minimal interstitial edema somewhat improved since previous chest x-ray and cardiomegaly. Patient's creatinine was 1.92 proBNP 2651. Patient was thought to be in acute renal failure was subsequently admitted.  Hospital Course:  The patient start on intravenous fluids at 50 cc an hour her Lasix was held and chemistries were repeated following admission her creatinine was 1.92 initially following day creatinine 1.13. Patient's urine output improved. Her blood sugars running from 141-136. She continued on medications as listed below and remained relatively asymptomatic. It was felt she could be discharged on these medications and seen tomorrow in primary care physician's office Dr. Everette Rank. Blood chemistries were be obtained at that time to determine if she can remain on metformin.   Discharge Instructions The patient is to continue the medications listed below    Medication List         albuterol 108 (90 BASE) MCG/ACT inhaler  Commonly known as:  PROVENTIL HFA;VENTOLIN HFA  Inhale 2 puffs into the lungs every 4 (four) hours as  needed for wheezing or shortness of breath.     bisoprolol 5 MG tablet  Commonly known as:  ZEBETA  Take 1 tablet (5 mg total) by mouth daily.     furosemide 40 MG tablet  Commonly known as:  LASIX  Take 20 mg by mouth daily.     lisinopril 5 MG tablet  Commonly known as:  PRINIVIL,ZESTRIL  Take 5 mg by mouth daily.     metFORMIN 1000 MG tablet  Commonly known as:  GLUCOPHAGE  Take 0.5 tablets (500 mg total) by mouth 2 (two) times daily with a meal.     potassium chloride SA 20 MEQ tablet  Commonly known as:  K-DUR,KLOR-CON  Take 1 tablet (20 mEq total) by mouth daily.       No Known Allergies  The results of significant diagnostics from this hospitalization (including imaging, microbiology, ancillary and laboratory) are listed below for reference.    Significant Diagnostic Studies: Ct Head Wo Contrast  06/18/2014   CLINICAL DATA:  Lightheadedness and dizziness.  EXAM: CT HEAD WITHOUT CONTRAST  TECHNIQUE: Contiguous axial images were obtained from the base of the skull through the vertex without contrast.  COMPARISON:  03/26/2010  FINDINGS: No evidence for acute hemorrhage, mass lesion, midline shift, hydrocephalus or large infarct. The visualized sinuses are clear. Again noted is diffuse thickening of the calvarium. No acute bone abnormality.  IMPRESSION: No acute intracranial abnormality.   Electronically Signed   By: Markus Daft M.D.   On: 06/18/2014 20:25   Dg Chest Portable 1 View  06/06/2014   CLINICAL DATA:  Shortness of breath and cough.  EXAM: PORTABLE CHEST - 1 VIEW  COMPARISON:  05/09/2014  FINDINGS: The cardiac silhouette remains enlarged, unchanged. Pulmonary vascular congestion and interstitial opacities consistent with edema are stable to minimally improved from the prior study. No definite pleural effusion or pneumothorax is identified. No acute osseous abnormality is seen.  IMPRESSION: Cardiomegaly and interstitial edema, stable to minimally improved from the prior  study.   Electronically Signed   By: Logan Bores   On: 06/06/2014 13:15    Microbiology: No results found for this or any previous visit (from the past 240 hour(s)).   Labs: Basic Metabolic Panel:  Recent Labs Lab 06/18/14 1338 06/19/14 0603  NA 143 141  K 4.7 4.2  CL 104 105  CO2 25 23  GLUCOSE 154* 142*  BUN 23 19  CREATININE 1.92* 1.13*  CALCIUM 9.6 9.0   Liver Function Tests: No results found for this basename: AST, ALT, ALKPHOS, BILITOT, PROT, ALBUMIN,  in the last 168 hours No results found for this basename: LIPASE, AMYLASE,  in the last 168 hours No results found for this basename: AMMONIA,  in the last 168 hours CBC:  Recent Labs Lab 06/18/14 1338 06/19/14 0603  WBC 6.2 5.2  NEUTROABS 3.6  --   HGB 13.8 13.0  HCT 43.5 40.7  MCV 83.3 83.6  PLT PLATELET CLUMPS NOTED ON SMEAR, COUNT APPEARS ADEQUATE 212   Cardiac Enzymes: No results found for this basename: CKTOTAL, CKMB, CKMBINDEX, TROPONINI,  in the last 168 hours BNP: BNP (last 3 results)  Recent Labs  06/06/14 1215  PROBNP 2651.0*   CBG:  Recent Labs Lab 06/18/14 1310 06/18/14 2119 06/19/14 0747 06/19/14 1215 06/19/14 1650  GLUCAP 177* 242* 136* 141* 126*    Principal Problem:   ARF (acute renal failure) Active Problems:   HYPERLIPIDEMIA   Essential hypertension, benign   CARDIOMYOPATHY   Mitral regurgitation   Time coordinating discharge: 30 minutes Signed:  Marjean Donna, MD 06/19/2014, 5:22 PM

## 2014-06-25 NOTE — Progress Notes (Signed)
This patient was admitted with acute renal failure, improved with IV fluids.

## 2014-06-26 ENCOUNTER — Encounter: Payer: PRIVATE HEALTH INSURANCE | Admitting: Adult Health

## 2014-06-26 ENCOUNTER — Encounter: Payer: Self-pay | Admitting: Adult Health

## 2014-06-26 NOTE — Progress Notes (Signed)
Error Cancelled

## 2014-07-08 ENCOUNTER — Inpatient Hospital Stay (HOSPITAL_COMMUNITY)
Admission: EM | Admit: 2014-07-08 | Discharge: 2014-07-14 | DRG: 194 | Disposition: A | Discharge status: Home Health | Payer: PRIVATE HEALTH INSURANCE | Attending: Family Medicine | Admitting: Family Medicine

## 2014-07-08 ENCOUNTER — Encounter (HOSPITAL_COMMUNITY): Payer: Self-pay | Admitting: Emergency Medicine

## 2014-07-08 ENCOUNTER — Emergency Department (HOSPITAL_COMMUNITY): Payer: PRIVATE HEALTH INSURANCE

## 2014-07-08 DIAGNOSIS — Z8673 Personal history of transient ischemic attack (TIA), and cerebral infarction without residual deficits: Secondary | ICD-10-CM

## 2014-07-08 DIAGNOSIS — I2510 Atherosclerotic heart disease of native coronary artery without angina pectoris: Secondary | ICD-10-CM | POA: Diagnosis present

## 2014-07-08 DIAGNOSIS — I10 Essential (primary) hypertension: Secondary | ICD-10-CM | POA: Diagnosis present

## 2014-07-08 DIAGNOSIS — J189 Pneumonia, unspecified organism: Secondary | ICD-10-CM

## 2014-07-08 DIAGNOSIS — E785 Hyperlipidemia, unspecified: Secondary | ICD-10-CM | POA: Diagnosis present

## 2014-07-08 DIAGNOSIS — J180 Bronchopneumonia, unspecified organism: Secondary | ICD-10-CM | POA: Diagnosis present

## 2014-07-08 DIAGNOSIS — J45909 Unspecified asthma, uncomplicated: Secondary | ICD-10-CM | POA: Diagnosis present

## 2014-07-08 DIAGNOSIS — E039 Hypothyroidism, unspecified: Secondary | ICD-10-CM | POA: Diagnosis present

## 2014-07-08 DIAGNOSIS — I340 Nonrheumatic mitral (valve) insufficiency: Secondary | ICD-10-CM

## 2014-07-08 DIAGNOSIS — I428 Other cardiomyopathies: Secondary | ICD-10-CM

## 2014-07-08 DIAGNOSIS — I509 Heart failure, unspecified: Secondary | ICD-10-CM | POA: Diagnosis present

## 2014-07-08 DIAGNOSIS — M109 Gout, unspecified: Secondary | ICD-10-CM | POA: Diagnosis present

## 2014-07-08 DIAGNOSIS — I5020 Unspecified systolic (congestive) heart failure: Secondary | ICD-10-CM | POA: Diagnosis present

## 2014-07-08 DIAGNOSIS — Z825 Family history of asthma and other chronic lower respiratory diseases: Secondary | ICD-10-CM

## 2014-07-08 DIAGNOSIS — I059 Rheumatic mitral valve disease, unspecified: Secondary | ICD-10-CM

## 2014-07-08 DIAGNOSIS — R0902 Hypoxemia: Secondary | ICD-10-CM | POA: Diagnosis present

## 2014-07-08 DIAGNOSIS — E1169 Type 2 diabetes mellitus with other specified complication: Secondary | ICD-10-CM | POA: Diagnosis present

## 2014-07-08 DIAGNOSIS — E119 Type 2 diabetes mellitus without complications: Secondary | ICD-10-CM | POA: Diagnosis present

## 2014-07-08 DIAGNOSIS — R0602 Shortness of breath: Secondary | ICD-10-CM | POA: Diagnosis not present

## 2014-07-08 LAB — CBC WITH DIFFERENTIAL/PLATELET
Basophils Absolute: 0 10*3/uL (ref 0.0–0.1)
Basophils Relative: 0 % (ref 0–1)
Eosinophils Absolute: 0 10*3/uL (ref 0.0–0.7)
Eosinophils Relative: 0 % (ref 0–5)
HCT: 39.5 % (ref 36.0–46.0)
Hemoglobin: 13 g/dL (ref 12.0–15.0)
Lymphocytes Relative: 16 % (ref 12–46)
Lymphs Abs: 1.8 10*3/uL (ref 0.7–4.0)
MCH: 27.2 pg (ref 26.0–34.0)
MCHC: 32.9 g/dL (ref 30.0–36.0)
MCV: 82.6 fL (ref 78.0–100.0)
Monocytes Absolute: 0.6 10*3/uL (ref 0.1–1.0)
Monocytes Relative: 5 % (ref 3–12)
Neutro Abs: 9.3 10*3/uL — ABNORMAL HIGH (ref 1.7–7.7)
Neutrophils Relative %: 79 % — ABNORMAL HIGH (ref 43–77)
Platelets: 254 10*3/uL (ref 150–400)
RBC: 4.78 MIL/uL (ref 3.87–5.11)
RDW: 14.6 % (ref 11.5–15.5)
WBC: 11.8 10*3/uL — ABNORMAL HIGH (ref 4.0–10.5)

## 2014-07-08 LAB — BASIC METABOLIC PANEL
Anion gap: 17 — ABNORMAL HIGH (ref 5–15)
BUN: 15 mg/dL (ref 6–23)
CO2: 23 mEq/L (ref 19–32)
Calcium: 9.5 mg/dL (ref 8.4–10.5)
Chloride: 103 mEq/L (ref 96–112)
Creatinine, Ser: 1.08 mg/dL (ref 0.50–1.10)
GFR calc Af Amer: 57 mL/min — ABNORMAL LOW (ref >90)
GFR calc non Af Amer: 49 mL/min — ABNORMAL LOW (ref >90)
Glucose, Bld: 140 mg/dL — ABNORMAL HIGH (ref 70–99)
Potassium: 3.7 mEq/L (ref 3.7–5.3)
Sodium: 143 mEq/L (ref 137–147)

## 2014-07-08 LAB — PRO B NATRIURETIC PEPTIDE: Pro B Natriuretic peptide (BNP): 3524 pg/mL — ABNORMAL HIGH (ref 0–450)

## 2014-07-08 LAB — TROPONIN I: Troponin I: <0.3 ng/mL (ref <0.30)

## 2014-07-08 LAB — LACTIC ACID, PLASMA: Lactic Acid, Venous: 1.7 mmol/L (ref 0.5–2.2)

## 2014-07-08 MED ORDER — DEXTROSE 5 % IV SOLN
2.0000 g | Freq: Once | INTRAVENOUS | Status: AC
  Administered 2014-07-08: 2 g via INTRAVENOUS
  Filled 2014-07-08: qty 2

## 2014-07-08 MED ORDER — HYDROCODONE-HOMATROPINE 5-1.5 MG/5ML PO SYRP
5.0000 mL | ORAL_SOLUTION | ORAL | Status: DC | PRN
  Administered 2014-07-10 – 2014-07-13 (×6): 5 mL via ORAL
  Filled 2014-07-08 (×6): qty 5

## 2014-07-08 MED ORDER — VANCOMYCIN HCL IN DEXTROSE 1-5 GM/200ML-% IV SOLN
1000.0000 mg | Freq: Once | INTRAVENOUS | Status: DC
  Administered 2014-07-08: 1000 mg via INTRAVENOUS

## 2014-07-08 MED ORDER — DEXTROSE 5 % IV SOLN
2.0000 g | INTRAVENOUS | Status: DC
  Administered 2014-07-09 – 2014-07-13 (×5): 2 g via INTRAVENOUS
  Filled 2014-07-08 (×6): qty 2

## 2014-07-08 MED ORDER — SODIUM CHLORIDE 0.9 % IV SOLN
250.0000 mL | INTRAVENOUS | Status: DC | PRN
  Administered 2014-07-09 – 2014-07-14 (×2): 250 mL via INTRAVENOUS

## 2014-07-08 MED ORDER — FUROSEMIDE 20 MG PO TABS
20.0000 mg | ORAL_TABLET | Freq: Every morning | ORAL | Status: DC
  Administered 2014-07-09 – 2014-07-12 (×4): 20 mg via ORAL
  Filled 2014-07-08 (×4): qty 1

## 2014-07-08 MED ORDER — ALBUTEROL SULFATE (2.5 MG/3ML) 0.083% IN NEBU: 3.0000 mL | INHALATION_SOLUTION | RESPIRATORY_TRACT | Status: DC | PRN

## 2014-07-08 MED ORDER — ACETAMINOPHEN 325 MG PO TABS
650.0000 mg | ORAL_TABLET | Freq: Once | ORAL | Status: AC
Start: 2014-07-08 — End: 2014-07-08
  Administered 2014-07-08: 650 mg via ORAL
  Filled 2014-07-08: qty 2

## 2014-07-08 MED ORDER — POTASSIUM CHLORIDE CRYS ER 20 MEQ PO TBCR
20.0000 meq | EXTENDED_RELEASE_TABLET | Freq: Every day | ORAL | Status: DC
  Administered 2014-07-09 – 2014-07-14 (×6): 20 meq via ORAL
  Filled 2014-07-08 (×6): qty 1

## 2014-07-08 MED ORDER — ALBUTEROL SULFATE (2.5 MG/3ML) 0.083% IN NEBU
5.0000 mg | INHALATION_SOLUTION | Freq: Once | RESPIRATORY_TRACT | Status: AC
  Administered 2014-07-08: 5 mg via RESPIRATORY_TRACT
  Filled 2014-07-08: qty 6

## 2014-07-08 MED ORDER — ENOXAPARIN SODIUM 40 MG/0.4ML ~~LOC~~ SOLN
40.0000 mg | SUBCUTANEOUS | Status: DC
  Administered 2014-07-09 – 2014-07-13 (×5): 40 mg via SUBCUTANEOUS
  Filled 2014-07-08 (×5): qty 0.4

## 2014-07-08 MED ORDER — VANCOMYCIN HCL IN DEXTROSE 1-5 GM/200ML-% IV SOLN
1000.0000 mg | INTRAVENOUS | Status: DC
  Administered 2014-07-08 – 2014-07-11 (×4): 1000 mg via INTRAVENOUS
  Filled 2014-07-08 (×6): qty 200

## 2014-07-08 MED ORDER — SODIUM CHLORIDE 0.9 % IJ SOLN
3.0000 mL | Freq: Two times a day (BID) | INTRAMUSCULAR | Status: DC
  Administered 2014-07-08 – 2014-07-13 (×10): 3 mL via INTRAVENOUS

## 2014-07-08 MED ORDER — SODIUM CHLORIDE 0.9 % IJ SOLN: 3.0000 mL | INTRAMUSCULAR | Status: DC | PRN

## 2014-07-08 MED ORDER — BISOPROLOL FUMARATE 5 MG PO TABS
5.0000 mg | ORAL_TABLET | Freq: Every morning | ORAL | Status: DC
  Administered 2014-07-09 – 2014-07-13 (×5): 5 mg via ORAL
  Filled 2014-07-08 (×8): qty 1

## 2014-07-08 MED ORDER — BIOTENE DRY MOUTH MT LIQD
15.0000 mL | Freq: Two times a day (BID) | OROMUCOSAL | Status: DC
  Administered 2014-07-08 – 2014-07-13 (×11): 15 mL via OROMUCOSAL

## 2014-07-08 NOTE — Progress Notes (Signed)
ANTIBIOTIC CONSULT NOTE - INITIAL  Pharmacy Consult for Vancomycin & Cefepime Indication: pneumonia  No Known Allergies  Patient Measurements: Weight: 166 lb (75.297 kg) Adjusted Body Weight:   Vital Signs: Temp: 101.9 F (38.8 C) (07/28 1819) Temp src: Oral (07/28 1819) BP: 141/74 mmHg (07/28 1900) Pulse Rate: 102 (07/28 1900) Intake/Output from previous day:   Intake/Output from this shift:    Labs:  Recent Labs  07/08/14 1920  WBC 11.8*  HGB 13.0  PLT 254  CREATININE 1.08   The CrCl is unknown because both a height and weight (above a minimum accepted value) are required for this calculation. No results found for this basename: VANCOTROUGH, VANCOPEAK, VANCORANDOM, GENTTROUGH, GENTPEAK, GENTRANDOM, TOBRATROUGH, TOBRAPEAK, TOBRARND, AMIKACINPEAK, AMIKACINTROU, AMIKACIN,  in the last 72 hours   Microbiology: No results found for this or any previous visit (from the past 720 hour(s)).  Medical History: Past Medical History  Diagnosis Date  . Nonischemic cardiomyopathy     LVEF 20-25%  . Asthma   . Cerebrovascular disease     TIA and stroke   . Essential hypertension, benign   . Coronary atherosclerosis of native coronary artery     Nonobstructive at cath 2005  . Hypothyroidism   . Carotid artery disease   . Gout   . Renal artery stenosis     Bilateral  . Bronchitis     Medications:  Scheduled:   Assessment: Cefepime & Vancomycin protocol for pneumonia Cough, congestion, febrile Calculated CrCl 38 ml/min  Goal of Therapy:  Vancomycin trough level 15-20 mcg/ml  Plan:  Vancomycin 1 GM IV every 24 hours Cefepime 2 GM IV every 24 hours Vancomycin trough at steady state Labs per protocol  Chriss Czar 07/08/2014,8:02 PM

## 2014-07-08 NOTE — ED Notes (Signed)
PT c/o sob with cough that started today.

## 2014-07-08 NOTE — ED Notes (Signed)
Pt co cough and congestion x 1 day. Febrile in triage 101.9.

## 2014-07-08 NOTE — ED Notes (Signed)
Pt. Unable to give urine sample at this time.  

## 2014-07-08 NOTE — H&P (Signed)
PCP:   Lanette Hampshire, MD   Chief Complaint:  Cough, fever, sob  HPI: 78 yo female h/o NICM ef 20%, tia, asthma, htn comes in with one day of coughing, fever and sob.  Several of her grandchildren have been sick lately with uri.  Comes in today for her sob.  Was initially hypoxic on arrival 86%on RA.  Better oxgyen sats and she feels sympomatically better with 2 L of supplemental oxygen.  No le edema or swelling.  She does not weight herself daily as she does not have a scale at home to do so.  She was recently hopsitalized for chf exacerbation earlier this month.  Denies any n/v/d.  No chest pain or abd pain.  Review of Systems:  Positive and negative as per HPI otherwise all other systems are negative  Past Medical History: Past Medical History  Diagnosis Date  . Nonischemic cardiomyopathy     LVEF 20-25%  . Asthma   . Cerebrovascular disease     TIA and stroke   . Essential hypertension, benign   . Coronary atherosclerosis of native coronary artery     Nonobstructive at cath 2005  . Hypothyroidism   . Carotid artery disease   . Gout   . Renal artery stenosis     Bilateral  . Bronchitis    Past Surgical History  Procedure Laterality Date  . Abdominal hysterectomy      Medications: Prior to Admission medications   Medication Sig Start Date End Date Taking? Authorizing Provider  bisoprolol (ZEBETA) 5 MG tablet Take 5 mg by mouth every morning.   Yes Historical Provider, MD  furosemide (LASIX) 20 MG tablet Take 20 mg by mouth every morning.   Yes Historical Provider, MD  HYDROcodone-homatropine (HYDROMET) 5-1.5 MG/5ML syrup Take 5 mLs by mouth every 4 (four) hours as needed for cough.   Yes Historical Provider, MD  metFORMIN (GLUCOPHAGE) 500 MG tablet Take by mouth 2 (two) times daily.   Yes Historical Provider, MD  potassium chloride SA (K-DUR,KLOR-CON) 20 MEQ tablet Take 1 tablet (20 mEq total) by mouth daily. 06/11/14  Yes Angus Ailene Ravel, MD  albuterol (PROVENTIL  HFA;VENTOLIN HFA) 108 (90 BASE) MCG/ACT inhaler Inhale 2 puffs into the lungs every 4 (four) hours as needed for wheezing or shortness of breath.    Historical Provider, MD    Allergies:  No Known Allergies  Social History:  reports that she has never smoked. She has never used smokeless tobacco. She reports that she does not drink alcohol or use illicit drugs.  Family History: Family History  Problem Relation Age of Onset  . Asthma Son     Physical Exam: Filed Vitals:   07/08/14 1845 07/08/14 1900 07/08/14 1906 07/08/14 2000  BP: 139/79 141/74  136/80  Pulse: 108 102  110  Temp:      TempSrc:      Resp: 28 30  27   Weight:      SpO2: 92% 93% 93% 93%   General appearance: alert, cooperative and no distress Head: Normocephalic, without obvious abnormality, atraumatic Eyes: negative Nose: Nares normal. Septum midline. Mucosa normal. No drainage or sinus tenderness. Neck: no JVD and supple, symmetrical, trachea midline Lungs: clear to auscultation bilaterally Heart: regular rate and rhythm, S1, S2 normal, no murmur, click, rub or gallop Abdomen: soft, non-tender; bowel sounds normal; no masses,  no organomegaly Extremities: extremities normal, atraumatic, no cyanosis or edema Pulses: 2+ and symmetric Skin: Skin color, texture, turgor normal. No rashes  or lesions Neurologic: Grossly normal    Labs on Admission:   Recent Labs  07/08/14 1920  NA 143  K 3.7  CL 103  CO2 23  GLUCOSE 140*  BUN 15  CREATININE 1.08  CALCIUM 9.5    Recent Labs  07/08/14 1920  WBC 11.8*  NEUTROABS 9.3*  HGB 13.0  HCT 39.5  MCV 82.6  PLT 254    Recent Labs  07/08/14 1920  TROPONINI <0.30   Radiological Exams on Admission: Dg Chest 2 View  07/08/2014   CLINICAL DATA:  Cough, nausea, shortness of breath.  EXAM: CHEST  2 VIEW  COMPARISON:  Prior radiograph from 06/06/2014  FINDINGS: Cardiomegaly is stable from prior exam. Mediastinal silhouette within normal limits.  Lungs are  normally inflated. Patchy multi focal infiltrates are seen involving the right upper and bilateral lower lobes, concerning for multi focal pneumonia. There is superimposed mild pulmonary vascular congestion without overt pulmonary edema. No pleural effusion. No pneumothorax.  No acute osseous abnormality.  IMPRESSION: Patchy multi focal infiltrates involving the right upper and bilateral lower lobes, most concerning for multi focal pneumonia.   Electronically Signed   By: Jeannine Boga M.D.   On: 07/08/2014 20:05   Assessment/Plan  78 yo female with hcap and mild hypoxia  Principal Problem:   PNA (pneumonia)-  Hcap.  Admit to tele.  Place on pna pathway with iv vanco/cefepime.  Oxygen prn.    Active Problems:   HYPERLIPIDEMIA   Essential hypertension, benign   CORONARY ATHEROSCLEROSIS NATIVE CORONARY ARTERY   CARDIOMYOPATHY-  No ivf needed at this time.   Mitral regurgitation  pcp dr Everette Rank.  Admit to tele bed.  Full code.   Dannya Pitkin A 07/08/2014, 8:31 PM

## 2014-07-09 LAB — BASIC METABOLIC PANEL
Anion gap: 12 (ref 5–15)
BUN: 13 mg/dL (ref 6–23)
CO2: 26 mEq/L (ref 19–32)
Calcium: 9.2 mg/dL (ref 8.4–10.5)
Chloride: 104 mEq/L (ref 96–112)
Creatinine, Ser: 1.03 mg/dL (ref 0.50–1.10)
GFR calc Af Amer: 60 mL/min — ABNORMAL LOW (ref >90)
GFR calc non Af Amer: 52 mL/min — ABNORMAL LOW (ref >90)
Glucose, Bld: 135 mg/dL — ABNORMAL HIGH (ref 70–99)
Potassium: 3.8 mEq/L (ref 3.7–5.3)
Sodium: 142 mEq/L (ref 137–147)

## 2014-07-09 LAB — CBC WITH DIFFERENTIAL/PLATELET
Basophils Absolute: 0 10*3/uL (ref 0.0–0.1)
Basophils Relative: 0 % (ref 0–1)
Eosinophils Absolute: 0 10*3/uL (ref 0.0–0.7)
Eosinophils Relative: 0 % (ref 0–5)
HCT: 37.2 % (ref 36.0–46.0)
Hemoglobin: 12 g/dL (ref 12.0–15.0)
Lymphocytes Relative: 12 % (ref 12–46)
Lymphs Abs: 1.8 10*3/uL (ref 0.7–4.0)
MCH: 26.6 pg (ref 26.0–34.0)
MCHC: 32.3 g/dL (ref 30.0–36.0)
MCV: 82.5 fL (ref 78.0–100.0)
Monocytes Absolute: 0.8 10*3/uL (ref 0.1–1.0)
Monocytes Relative: 6 % (ref 3–12)
Neutro Abs: 11.8 10*3/uL — ABNORMAL HIGH (ref 1.7–7.7)
Neutrophils Relative %: 82 % — ABNORMAL HIGH (ref 43–77)
Platelets: 233 10*3/uL (ref 150–400)
RBC: 4.51 MIL/uL (ref 3.87–5.11)
RDW: 14.8 % (ref 11.5–15.5)
WBC: 14.4 10*3/uL — ABNORMAL HIGH (ref 4.0–10.5)

## 2014-07-09 LAB — EXPECTORATED SPUTUM ASSESSMENT W GRAM STAIN, RFLX TO RESP C

## 2014-07-09 LAB — URINALYSIS, ROUTINE W REFLEX MICROSCOPIC
Bilirubin Urine: NEGATIVE
Glucose, UA: NEGATIVE mg/dL
Hgb urine dipstick: NEGATIVE
Leukocytes, UA: NEGATIVE
Nitrite: POSITIVE — AB
Specific Gravity, Urine: 1.015 (ref 1.005–1.030)
Urobilinogen, UA: 0.2 mg/dL (ref 0.0–1.0)
pH: 5.5 (ref 5.0–8.0)

## 2014-07-09 LAB — EXPECTORATED SPUTUM ASSESSMENT W REFEX TO RESP CULTURE

## 2014-07-09 LAB — STREP PNEUMONIAE URINARY ANTIGEN: Strep Pneumo Urinary Antigen: NEGATIVE

## 2014-07-09 LAB — URINE MICROSCOPIC-ADD ON

## 2014-07-09 MED ORDER — SIMETHICONE 40 MG/0.6ML PO SUSP
ORAL | Status: AC
  Filled 2014-07-09: qty 1.2

## 2014-07-09 MED ORDER — GUAIFENESIN ER 600 MG PO TB12
1200.0000 mg | ORAL_TABLET | Freq: Two times a day (BID) | ORAL | Status: DC
  Administered 2014-07-09 – 2014-07-14 (×11): 1200 mg via ORAL
  Filled 2014-07-09 (×11): qty 2

## 2014-07-09 NOTE — Care Management Note (Addendum)
    Page 1 of 2   07/14/2014     10:55:58 AM CARE MANAGEMENT NOTE 07/14/2014  Patient:  [name], [name]   Account Number:  [id number]  Date Initiated:  07/09/2014  Documentation initiated by:  Vladimir Creeks  Subjective/Objective Assessment:   Admitted with PNA. Pt lives at home with granddaughter, who is her Physiological scientist. She does not remember the name of the agency for whom her granddaughter works.  Pt is fairly independent in the home, with  Shenika's help, and will return home     Action/Plan:   Has had [facility] recently for [facility]- Called granddaughter, and  her agency is a new one with a strange name that she cannot pronounce, but it is on [address]   Anticipated DC Date:  07/14/2014   Anticipated DC Plan:  [place]  CM consult      [place]   Choice offered to / List presented to:  C-1 Patient      DME agency  [place] arranged  Aldine RN      [place].   Status of service:  Completed, signed off Medicare Important Message given?  YES (If response is "NO", the following Medicare IM given date fields will be blank) Date Medicare IM given:  07/11/2014 Medicare IM given by:  Jolene Provost Date Additional Medicare IM given:  07/14/2014 Additional Medicare IM given by:  Vladimir Creeks  Discharge Disposition:  [place]  Per UR Regulation:  Reviewed for med. necessity/level of care/duration of stay  If discussed at [place] of Stay Meetings, dates discussed:    Comments:  07/14/14 1030 Vladimir Creeks RN/CM Pt D/C home today with [facility]. 07/11/2014 1450 Jolene Provost, RN, MSN, PCCN Patient given scale to take home. Patient plans to return hom with self care and use of aid. No further CM needs noted at this time.  07/09/14 1600 Graciano Batson RN/CM Pt has CHF and would benefit from a scale and agrees she would use a scale if given one. Plan to give  a scale at D/C. She says Granddaughter witll assist her with this. Was recently D/C'ed form HH, so probably would not need HH at this time

## 2014-07-09 NOTE — Care Management Utilization Note (Signed)
UR completed 

## 2014-07-09 NOTE — Consult Note (Signed)
Consult requested by: Dr. Everette Rank Consult requested for pneumonia:  HPI: This is a 78 year old who came to the emergency department because of cough fever and shortness of breath. She has been around sick contacts. When she came to the emergency department she was noted to be hypoxic. She's not been able to cough anything up. She denies any chest pain. She has a history of asthma in the past. In addition to that she has a history of cardiomyopathy and has a history of nonobstructive coronary atherosclerosis. She says she feels a little better but is unable to cough anything up.  Past Medical History  Diagnosis Date  . Nonischemic cardiomyopathy     LVEF 20-25%  . Asthma   . Cerebrovascular disease     TIA and stroke   . Essential hypertension, benign   . Coronary atherosclerosis of native coronary artery     Nonobstructive at cath 2005  . Hypothyroidism   . Carotid artery disease   . Gout   . Renal artery stenosis     Bilateral  . Bronchitis      Family History  Problem Relation Age of Onset  . Asthma Son      History   Social History  . Marital Status: Single    Spouse Name: N/A    Number of Children: 72  . Years of Education: N/A   Occupational History  . Retired    Social History Main Topics  . Smoking status: Never Smoker   . Smokeless tobacco: Never Used  . Alcohol Use: No  . Drug Use: No  . Sexual Activity: No   Other Topics Concern  . None   Social History Narrative  . None     ROS: She has not had any edema. She denies any chest pain. No hemoptysis. No chills. No abdominal pain nausea or vomiting. She does complain of a sore throat.    Objective: Vital signs in last 24 hours: Temp:  [98.2 F (36.8 C)-101.9 F (38.8 C)] 98.5 F (36.9 C) (07/29 0610) Pulse Rate:  [79-117] 79 (07/29 0610) Resp:  [20-30] 20 (07/29 0610) BP: (117-167)/(62-139) 128/69 mmHg (07/29 0610) SpO2:  [87 %-97 %] 96 % (07/29 0610) Weight:  [73.528 kg (162 lb 1.6  oz)-75.297 kg (166 lb)] 73.528 kg (162 lb 1.6 oz) (07/29 0610) Weight change:  Last BM Date: 07/07/14  Intake/Output from previous day:    PHYSICAL EXAM She is awake and alert. She is coughing during the examination. Her pupils are reactive. Nose and throat are clear. Her neck is supple without masses. Her chest shows rhonchi bilaterally. Her heart is regular without gallop. Her abdomen is soft without masses bowel sounds are present and active. She does not have edema of the extremities. Central nervous system exam is grossly intact  Lab Results: Basic Metabolic Panel:  Recent Labs  07/08/14 1920 07/09/14 0710  NA 143 142  K 3.7 3.8  CL 103 104  CO2 23 26  GLUCOSE 140* 135*  BUN 15 13  CREATININE 1.08 1.03  CALCIUM 9.5 9.2   Liver Function Tests: No results found for this basename: AST, ALT, ALKPHOS, BILITOT, PROT, ALBUMIN,  in the last 72 hours No results found for this basename: LIPASE, AMYLASE,  in the last 72 hours No results found for this basename: AMMONIA,  in the last 72 hours CBC:  Recent Labs  07/08/14 1920 07/09/14 0710  WBC 11.8* 14.4*  NEUTROABS 9.3* 11.8*  HGB 13.0 12.0  HCT 39.5  37.2  MCV 82.6 82.5  PLT 254 233   Cardiac Enzymes:  Recent Labs  07/08/14 1920  TROPONINI <0.30   BNP:  Recent Labs  07/08/14 1920  PROBNP 3524.0*   D-Dimer: No results found for this basename: DDIMER,  in the last 72 hours CBG: No results found for this basename: GLUCAP,  in the last 72 hours Hemoglobin A1C: No results found for this basename: HGBA1C,  in the last 72 hours Fasting Lipid Panel: No results found for this basename: CHOL, HDL, LDLCALC, TRIG, CHOLHDL, LDLDIRECT,  in the last 72 hours Thyroid Function Tests: No results found for this basename: TSH, T4TOTAL, FREET4, T3FREE, THYROIDAB,  in the last 72 hours Anemia Panel: No results found for this basename: VITAMINB12, FOLATE, FERRITIN, TIBC, IRON, RETICCTPCT,  in the last 72 hours Coagulation: No  results found for this basename: LABPROT, INR,  in the last 72 hours Urine Drug Screen: Drugs of Abuse  No results found for this basename: labopia, cocainscrnur, labbenz, amphetmu, thcu, labbarb    Alcohol Level: No results found for this basename: ETH,  in the last 72 hours Urinalysis:  Recent Labs  07/09/14 0553  COLORURINE YELLOW  LABSPEC 1.015  PHURINE 5.5  GLUCOSEU NEGATIVE  HGBUR NEGATIVE  BILIRUBINUR NEGATIVE  KETONESUR TRACE*  PROTEINUR TRACE*  UROBILINOGEN 0.2  NITRITE POSITIVE*  LEUKOCYTESUR NEGATIVE   Misc. Labs:   ABGS: No results found for this basename: PHART, PCO2, PO2ART, TCO2, HCO3,  in the last 72 hours   MICROBIOLOGY: Recent Results (from the past 240 hour(s))  CULTURE, BLOOD (ROUTINE X 2)     Status: None   Collection Time    07/08/14 11:16 PM      Result Value Ref Range Status   Specimen Description BLOOD RIGHT HAND   Final   Special Requests BOTTLES DRAWN AEROBIC AND ANAEROBIC 8CC EACH   Final   Culture PENDING   Incomplete   Report Status PENDING   Incomplete  CULTURE, BLOOD (ROUTINE X 2)     Status: None   Collection Time    07/08/14 11:16 PM      Result Value Ref Range Status   Specimen Description BLOOD RIGHT HAND   Final   Special Requests BOTTLES DRAWN AEROBIC ONLY [place]   Final   Culture PENDING   Incomplete   Report Status PENDING   Incomplete    Studies/Results: Dg Chest 2 View  07/08/2014   CLINICAL DATA:  Cough, nausea, shortness of breath.  EXAM: CHEST  2 VIEW  COMPARISON:  Prior radiograph from 06/06/2014  FINDINGS: Cardiomegaly is stable from prior exam. Mediastinal silhouette within normal limits.  Lungs are normally inflated. Patchy multi focal infiltrates are seen involving the right upper and bilateral lower lobes, concerning for multi focal pneumonia. There is superimposed mild pulmonary vascular congestion without overt pulmonary edema. No pleural effusion. No pneumothorax.  No acute osseous abnormality.  IMPRESSION: Patchy  multi focal infiltrates involving the right upper and bilateral lower lobes, most concerning for multi focal pneumonia.   Electronically Signed   By: Jeannine Boga M.D.   On: 07/08/2014 20:05    Medications:  Prior to Admission:  Prescriptions prior to admission  Medication Sig Dispense Refill  . bisoprolol (ZEBETA) 5 MG tablet Take 5 mg by mouth every morning.      . furosemide (LASIX) 20 MG tablet Take 20 mg by mouth every morning.      Marland Kitchen HYDROcodone-homatropine (HYDROMET) 5-1.5 MG/5ML syrup Take 5 mLs by  mouth every 4 (four) hours as needed for cough.      . metFORMIN (GLUCOPHAGE) 500 MG tablet Take by mouth 2 (two) times daily.      . potassium chloride SA (K-DUR,KLOR-CON) 20 MEQ tablet Take 1 tablet (20 mEq total) by mouth daily.  30 tablet  5  . albuterol (PROVENTIL HFA;VENTOLIN HFA) 108 (90 BASE) MCG/ACT inhaler Inhale 2 puffs into the lungs every 4 (four) hours as needed for wheezing or shortness of breath.       Scheduled: . antiseptic oral rinse  15 mL Mouth Rinse BID  . bisoprolol  5 mg Oral q morning - 10a  . ceFEPime (MAXIPIME) IV  2 g Intravenous Q24H  . enoxaparin (LOVENOX) injection  40 mg Subcutaneous Q24H  . furosemide  20 mg Oral q morning - 10a  . potassium chloride SA  20 mEq Oral Daily  . simethicone      . sodium chloride  3 mL Intravenous Q12H  . vancomycin  1,000 mg Intravenous Q24H   Continuous:  KGY:JEHUDJ chloride, albuterol, HYDROcodone-homatropine, sodium chloride  Assesment: She has healthcare associated pneumonia. Her chest x-ray shows bilateral patchy infiltrates which could be consistent with congestive heart failure but her symptoms are much more consistent with pneumonia. She is known to have a nonischemic cardiomyopathy but typically she says when she has trouble with that she developed significant pedal edema and has PND and orthopnea which he has not had this episode. Principal Problem:   PNA (pneumonia) Active Problems:   HYPERLIPIDEMIA    Essential hypertension, benign   CORONARY ATHEROSCLEROSIS NATIVE CORONARY ARTERY   CARDIOMYOPATHY   Mitral regurgitation   HCAP (healthcare-associated pneumonia)    Plan: I agree with treatment for healthcare associated pneumonia. Careful observation to make sure she does not develop overt CHF in addition    LOS: 1 day   Julianah Marciel L 07/09/2014, 8:20 AM

## 2014-07-09 NOTE — Progress Notes (Signed)
Subjective: The patient continues to have productive cough. She was admitted through the emergency room with newly diagnosed pneumonia. X-ray showed patchy multifocal infiltrates involving right upper lobe and lower lobes. She does have also cardiomyopathy and congestive heart failure.  Objective: Vital signs in last 24 hours: Temp:  [98.2 F (36.8 C)-101.9 F (38.8 C)] 98.5 F (36.9 C) (07/29 0610) Pulse Rate:  [79-117] 79 (07/29 0610) Resp:  [20-30] 20 (07/29 0610) BP: (117-167)/(62-139) 128/69 mmHg (07/29 0610) SpO2:  [87 %-97 %] 96 % (07/29 0610) Weight:  [73.528 kg (162 lb 1.6 oz)-75.297 kg (166 lb)] 73.528 kg (162 lb 1.6 oz) (07/29 0610) Weight change:  Last BM Date: 07/07/14  Intake/Output from previous day:   Intake/Output this shift:    Physical Exam: General appearance the patient is alert and cooperative  HEENT negative  Neck supple no JVD or thyroid abnormalities  Lungs rhonchi bilaterally  Heart regular rhythm no murmurs  Abdomen no palpable organs or masses  Extremities free of edema  Recent Labs  07/08/14 1920  WBC 11.8*  HGB 13.0  HCT 39.5  PLT 254   BMET  Recent Labs  07/08/14 1920  NA 143  K 3.7  CL 103  CO2 23  GLUCOSE 140*  BUN 15  CREATININE 1.08  CALCIUM 9.5    Studies/Results: Dg Chest 2 View  07/08/2014   CLINICAL DATA:  Cough, nausea, shortness of breath.  EXAM: CHEST  2 VIEW  COMPARISON:  Prior radiograph from 06/06/2014  FINDINGS: Cardiomegaly is stable from prior exam. Mediastinal silhouette within normal limits.  Lungs are normally inflated. Patchy multi focal infiltrates are seen involving the right upper and bilateral lower lobes, concerning for multi focal pneumonia. There is superimposed mild pulmonary vascular congestion without overt pulmonary edema. No pleural effusion. No pneumothorax.  No acute osseous abnormality.  IMPRESSION: Patchy multi focal infiltrates involving the right upper and bilateral lower lobes, most  concerning for multi focal pneumonia.   Electronically Signed   By: Jeannine Boga M.D.   On: 07/08/2014 20:05    Medications:  . antiseptic oral rinse  15 mL Mouth Rinse BID  . bisoprolol  5 mg Oral q morning - 10a  . ceFEPime (MAXIPIME) IV  2 g Intravenous Q24H  . enoxaparin (LOVENOX) injection  40 mg Subcutaneous Q24H  . furosemide  20 mg Oral q morning - 10a  . potassium chloride SA  20 mEq Oral Daily  . sodium chloride  3 mL Intravenous Q12H  . vancomycin  1,000 mg Intravenous Q24H        Assessment/Plan: 1. Bronchopneumonia right upper and lower lobes-plan to continue current IV antibiotics Maxipime and vancomycin. We'll obtain pulmonary consult  2. Nonischemic cardiomyopathy with ejection fraction 20-25% to continue furosemide-continue to monitor   LOS: 1 day   Margree Gimbel G 07/09/2014, 6:29 AM

## 2014-07-09 NOTE — ED Provider Notes (Signed)
CSN: [id number]     Arrival date & time 07/08/14  1811 History   First MD Initiated Contact with Patient 07/08/14 1833     Chief Complaint  Patient presents with  . Shortness of Breath     (Consider location/radiation/quality/duration/timing/severity/associated sxs/prior Treatment) HPI  78 year old female with shortness of breath. Gradual onset about a day ago and progressively worsening. Increased cough. She feels like she has something in her chest but she cannot get it out. Subjective fever. Feels generally fatigued. Denies any chest pain. Patient has a history of nonischemic cardiomyopathy with an EF of approximately 25%. Recent admission for acute kidney injury. She reports that she had been feeling pretty well since discharge up until yesterday. No significant change in lower extremity edema. No sick contacts. Nausea, but no vomiting. Denies abdominal pain. No diarrhea. No urinary complaints. PCP is Dr. Everette Rank.  Past Medical History  Diagnosis Date  . Nonischemic cardiomyopathy     LVEF 20-25%  . Asthma   . Cerebrovascular disease     TIA and stroke   . Essential hypertension, benign   . Coronary atherosclerosis of native coronary artery     Nonobstructive at cath 2005  . Hypothyroidism   . Carotid artery disease   . Gout   . Renal artery stenosis     Bilateral  . Bronchitis    Past Surgical History  Procedure Laterality Date  . Abdominal hysterectomy     Family History  Problem Relation Age of Onset  . Asthma Son    History  Substance Use Topics  . Smoking status: Never Smoker   . Smokeless tobacco: Never Used  . Alcohol Use: No   OB History   Grav Para Term Preterm Abortions TAB SAB Ect Mult Living                 Review of Systems  All systems reviewed and negative, other than as noted in HPI.  Allergies  Review of patient's allergies indicates no known allergies.  Home Medications   Prior to Admission medications   Medication Sig Start Date End  Date Taking? Authorizing Provider  bisoprolol (ZEBETA) 5 MG tablet Take 5 mg by mouth every morning.   Yes Historical Provider, MD  furosemide (LASIX) 20 MG tablet Take 20 mg by mouth every morning.   Yes Historical Provider, MD  HYDROcodone-homatropine (HYDROMET) 5-1.5 MG/5ML syrup Take 5 mLs by mouth every 4 (four) hours as needed for cough.   Yes Historical Provider, MD  metFORMIN (GLUCOPHAGE) 500 MG tablet Take by mouth 2 (two) times daily.   Yes Historical Provider, MD  potassium chloride SA (K-DUR,KLOR-CON) 20 MEQ tablet Take 1 tablet (20 mEq total) by mouth daily. 06/11/14  Yes Angus Ailene Ravel, MD  albuterol (PROVENTIL HFA;VENTOLIN HFA) 108 (90 BASE) MCG/ACT inhaler Inhale 2 puffs into the lungs every 4 (four) hours as needed for wheezing or shortness of breath.    Historical Provider, MD   BP 128/69  Pulse 79  Temp(Src) 98.5 F (36.9 C) (Oral)  Resp 20  Ht 5\' 5"  (1.651 m)  Wt 162 lb 1.6 oz (73.528 kg)  BMI 26.97 kg/m2  SpO2 96% Physical Exam  Nursing note and vitals reviewed. Constitutional: She appears well-developed and well-nourished. No distress.  Sitting in bed. Appears tired, but her eyes were open and she is responding appropriately to my questioning.  HENT:  Head: Normocephalic and atraumatic.  Eyes: Conjunctivae are normal. Right eye exhibits no discharge. Left eye exhibits no  discharge.  Neck: Neck supple.  Cardiovascular: Regular rhythm and normal heart sounds.  Exam reveals no gallop and no friction rub.   No murmur heard. Tachycardic  Pulmonary/Chest:  Tachypnea with a rate of approximately 22-24. Speaking in short phrases. Faint rhonchi left lower lobe.  Abdominal: Soft. She exhibits no distension. There is no tenderness.  Musculoskeletal: She exhibits no edema and no tenderness.  Trace, symmetric pitting lower extremity edema. No calf tenderness. X-ray symmetric as compared to each other. Negative Homans.  Neurological: She is alert.  Skin: Skin is warm and dry.   Psychiatric: She has a normal mood and affect. Her behavior is normal. Thought content normal.    ED Course  Procedures (including critical care time) Labs Review Labs Reviewed  CBC WITH DIFFERENTIAL - Abnormal; Notable for the following:    WBC 11.8 (*)    Neutrophils Relative % 79 (*)    Neutro Abs 9.3 (*)    All other components within normal limits  BASIC METABOLIC PANEL - Abnormal; Notable for the following:    Glucose, Bld 140 (*)    GFR calc non Af Amer 49 (*)    GFR calc Af Amer 57 (*)    Anion gap 17 (*)    All other components within normal limits  PRO B NATRIURETIC PEPTIDE - Abnormal; Notable for the following:    Pro B Natriuretic peptide (BNP) 3524.0 (*)    All other components within normal limits  URINALYSIS, ROUTINE W REFLEX MICROSCOPIC - Abnormal; Notable for the following:    Ketones, ur TRACE (*)    Protein, ur TRACE (*)    Nitrite POSITIVE (*)    All other components within normal limits  BASIC METABOLIC PANEL - Abnormal; Notable for the following:    Glucose, Bld 135 (*)    GFR calc non Af Amer 52 (*)    GFR calc Af Amer 60 (*)    All other components within normal limits  CBC WITH DIFFERENTIAL - Abnormal; Notable for the following:    WBC 14.4 (*)    Neutrophils Relative % 82 (*)    Neutro Abs 11.8 (*)    All other components within normal limits  URINE MICROSCOPIC-ADD ON - Abnormal; Notable for the following:    Bacteria, UA MANY (*)    All other components within normal limits  CULTURE, BLOOD (ROUTINE X 2)  CULTURE, BLOOD (ROUTINE X 2)  CULTURE, EXPECTORATED SPUTUM-ASSESSMENT  CULTURE, RESPIRATORY (NON-EXPECTORATED)  TROPONIN I  LACTIC ACID, PLASMA  STREP PNEUMONIAE URINARY ANTIGEN  LEGIONELLA ANTIGEN, URINE    Imaging Review Dg Chest 2 View  07/08/2014   CLINICAL DATA:  Cough, nausea, shortness of breath.  EXAM: CHEST  2 VIEW  COMPARISON:  Prior radiograph from 06/06/2014  FINDINGS: Cardiomegaly is stable from prior exam. Mediastinal  silhouette within normal limits.  Lungs are normally inflated. Patchy multi focal infiltrates are seen involving the right upper and bilateral lower lobes, concerning for multi focal pneumonia. There is superimposed mild pulmonary vascular congestion without overt pulmonary edema. No pleural effusion. No pneumothorax.  No acute osseous abnormality.  IMPRESSION: Patchy multi focal infiltrates involving the right upper and bilateral lower lobes, most concerning for multi focal pneumonia.   Electronically Signed   By: Jeannine Boga M.D.   On: 07/08/2014 20:05     EKG Interpretation   Date/Time:  Tuesday July 08 2014 18:29:03 EDT Ventricular Rate:  112 PR Interval:  161 QRS Duration: 143 QT Interval:  366  QTC Calculation: 500 R Axis:   91 Text Interpretation:  Sinus tachycardia Atrial premature complex Right  bundle branch block T wave abnormality, consider inferolateral ischemia  Confirmed by Wilson Singer  MD, Nicolas Banh ([id number]) on 07/08/2014 6:34:43 PM      MDM   Final diagnoses:  HCAP (healthcare-associated pneumonia)    78 year old female with cough and shortness of breath. X-ray consistent with pneumonia. We'll cover for healthcare associated pneumonia given recent admission. No history of nonischemic cardiomyopathy with an ejection fraction of approximately 25-30%. Mixed heart failure. BNP is elevated, but she does not appear to be overtly volume overloaded. Hypoxia on room air when she presented. Oxygen saturations are adequate with 2 L via nasal cannula. She has some mild tachycardia and she is mildly tachypnea. She has no accessory muscle usage though. I feel she needs inpatient treatment at this time.   Virgel Manifold, MD 07/09/14 1447

## 2014-07-10 LAB — CBC
HCT: 38 % (ref 36.0–46.0)
Hemoglobin: 12.3 g/dL (ref 12.0–15.0)
MCH: 26.7 pg (ref 26.0–34.0)
MCHC: 32.4 g/dL (ref 30.0–36.0)
MCV: 82.6 fL (ref 78.0–100.0)
Platelets: 231 10*3/uL (ref 150–400)
RBC: 4.6 MIL/uL (ref 3.87–5.11)
RDW: 14.8 % (ref 11.5–15.5)
WBC: 10 10*3/uL (ref 4.0–10.5)

## 2014-07-10 LAB — LEGIONELLA ANTIGEN, URINE: Legionella Antigen, Urine: NEGATIVE

## 2014-07-10 NOTE — Progress Notes (Signed)
Subjective: She says she feels better. She still coughing. She has no other new complaints.  Objective: Vital signs in last 24 hours: Temp:  [97.8 F (36.6 C)-98.7 F (37.1 C)] 98.4 F (36.9 C) (07/30 0459) Pulse Rate:  [78-80] 78 (07/29 2033) Resp:  [20] 20 (07/30 0459) BP: (118-140)/(67-78) 140/67 mmHg (07/30 0459) SpO2:  [95 %-98 %] 97 % (07/30 0459) Weight change:  Last BM Date: 07/07/14  Intake/Output from previous day: 07/29 0701 - 07/30 0700 In: 960 [P.O.:960] Out: 900 [Urine:900]  PHYSICAL EXAM General appearance: alert, cooperative and mild distress Resp: rhonchi bilaterally Cardio: regular rate and rhythm, S1, S2 normal, no murmur, click, rub or gallop GI: soft, non-tender; bowel sounds normal; no masses,  no organomegaly Extremities: extremities normal, atraumatic, no cyanosis or edema  Lab Results:  Results for orders placed during the hospital encounter of 07/08/14 (from the past 48 hour(s))  CBC WITH DIFFERENTIAL     Status: Abnormal   Collection Time    07/08/14  7:20 PM      Result Value Ref Range   WBC 11.8 (*) 4.0 - 10.5 K/uL   RBC 4.78  3.87 - 5.11 MIL/uL   Hemoglobin 13.0  12.0 - 15.0 g/dL   HCT 39.5  36.0 - 46.0 %   MCV 82.6  78.0 - 100.0 fL   MCH 27.2  26.0 - 34.0 pg   MCHC 32.9  30.0 - 36.0 g/dL   RDW 14.6  11.5 - 15.5 %   Platelets 254  150 - 400 K/uL   Neutrophils Relative % 79 (*) 43 - 77 %   Neutro Abs 9.3 (*) 1.7 - 7.7 K/uL   Lymphocytes Relative 16  12 - 46 %   Lymphs Abs 1.8  0.7 - 4.0 K/uL   Monocytes Relative 5  3 - 12 %   Monocytes Absolute 0.6  0.1 - 1.0 K/uL   Eosinophils Relative 0  0 - 5 %   Eosinophils Absolute 0.0  0.0 - 0.7 K/uL   Basophils Relative 0  0 - 1 %   Basophils Absolute 0.0  0.0 - 0.1 K/uL  BASIC METABOLIC PANEL     Status: Abnormal   Collection Time    07/08/14  7:20 PM      Result Value Ref Range   Sodium 143  137 - 147 mEq/L   Potassium 3.7  3.7 - 5.3 mEq/L   Chloride 103  96 - 112 mEq/L   CO2 23  19 -  32 mEq/L   Glucose, Bld 140 (*) 70 - 99 mg/dL   BUN 15  6 - 23 mg/dL   Creatinine, Ser 1.08  0.50 - 1.10 mg/dL   Calcium 9.5  8.4 - 10.5 mg/dL   GFR calc non Af Amer 49 (*) >90 mL/min   GFR calc Af Amer 57 (*) >90 mL/min   Comment: (NOTE)     The eGFR has been calculated using the CKD EPI equation.     This calculation has not been validated in all clinical situations.     eGFR's persistently <90 mL/min signify possible Chronic Kidney     Disease.   Anion gap 17 (*) 5 - 15  TROPONIN I     Status: None   Collection Time    07/08/14  7:20 PM      Result Value Ref Range   Troponin I <0.30  <0.30 ng/mL   Comment:  Due to the release kinetics of cTnI,     a negative result within the first hours     of the onset of symptoms does not rule out     myocardial infarction with certainty.     If myocardial infarction is still suspected,     repeat the test at appropriate intervals.  PRO B NATRIURETIC PEPTIDE     Status: Abnormal   Collection Time    07/08/14  7:20 PM      Result Value Ref Range   Pro B Natriuretic peptide (BNP) 3524.0 (*) 0 - 450 pg/mL  LACTIC ACID, PLASMA     Status: None   Collection Time    07/08/14  7:42 PM      Result Value Ref Range   Lactic Acid, Venous 1.7  0.5 - 2.2 mmol/L  CULTURE, BLOOD (ROUTINE X 2)     Status: None   Collection Time    07/08/14 11:16 PM      Result Value Ref Range   Specimen Description BLOOD RIGHT HAND     Special Requests BOTTLES DRAWN AEROBIC AND ANAEROBIC 8CC EACH     Culture NO GROWTH 1 DAY     Report Status PENDING    CULTURE, BLOOD (ROUTINE X 2)     Status: None   Collection Time    07/08/14 11:16 PM      Result Value Ref Range   Specimen Description BLOOD RIGHT HAND     Special Requests BOTTLES DRAWN AEROBIC ONLY 6CC     Culture NO GROWTH 1 DAY     Report Status PENDING    URINALYSIS, ROUTINE W REFLEX MICROSCOPIC     Status: Abnormal   Collection Time    07/09/14  5:53 AM      Result Value Ref Range   Color,  Urine YELLOW  YELLOW   APPearance CLEAR  CLEAR   Specific Gravity, Urine 1.015  1.005 - 1.030   pH 5.5  5.0 - 8.0   Glucose, UA NEGATIVE  NEGATIVE mg/dL   Hgb urine dipstick NEGATIVE  NEGATIVE   Bilirubin Urine NEGATIVE  NEGATIVE   Ketones, ur TRACE (*) NEGATIVE mg/dL   Protein, ur TRACE (*) NEGATIVE mg/dL   Urobilinogen, UA 0.2  0.0 - 1.0 mg/dL   Nitrite POSITIVE (*) NEGATIVE   Leukocytes, UA NEGATIVE  NEGATIVE  CULTURE, EXPECTORATED SPUTUM-ASSESSMENT     Status: None   Collection Time    07/09/14  5:53 AM      Result Value Ref Range   Specimen Description SPUTUM EXPECTORATED     Special Requests NONE     Sputum evaluation       Value: THIS SPECIMEN IS ACCEPTABLE. RESPIRATORY CULTURE REPORT TO FOLLOW. REVIEWED BY BAUGHAM,M. 07/09/2014 CORRECTED RESULTS CALLED TO: HOLT,J. AT 1026 ON 07/09/2014 BY BAUGHAM,M.     Performed at [facility]   Report Status 07/09/2014 FINAL    STREP PNEUMONIAE URINARY ANTIGEN     Status: None   Collection Time    07/09/14  5:53 AM      Result Value Ref Range   Strep Pneumo Urinary Antigen NEGATIVE  NEGATIVE   Comment: PERFORMED AT [facility]                Infection due to S. pneumoniae     cannot be absolutely ruled out     since the antigen present     may be below the detection limit  of the test.     Performed at [facility]     Status: Abnormal   Collection Time    07/09/14  5:53 AM      Result Value Ref Range   WBC, UA 0-2  <3 WBC/hpf   Bacteria, UA MANY (*) RARE  CULTURE, RESPIRATORY (NON-EXPECTORATED)     Status: None   Collection Time    07/09/14  5:53 AM      Result Value Ref Range   Specimen Description SPUTUM EXPECTORATED     Special Requests NONE     Gram Stain       Value: ABUNDANT WBC PRESENT, PREDOMINANTLY PMN     FEW SQUAMOUS EPITHELIAL CELLS PRESENT     FEW GRAM POSITIVE COCCI     IN PAIRS FEW GRAM VARIABLE ROD     Performed at [facility]   Culture        Value: NORMAL OROPHARYNGEAL FLORA     Performed at [facility]   Report Status PENDING    BASIC METABOLIC PANEL     Status: Abnormal   Collection Time    07/09/14  7:10 AM      Result Value Ref Range   Sodium 142  137 - 147 mEq/L   Potassium 3.8  3.7 - 5.3 mEq/L   Chloride 104  96 - 112 mEq/L   CO2 26  19 - 32 mEq/L   Glucose, Bld 135 (*) 70 - 99 mg/dL   BUN 13  6 - 23 mg/dL   Creatinine, Ser 1.03  0.50 - 1.10 mg/dL   Calcium 9.2  8.4 - 10.5 mg/dL   GFR calc non Af Amer 52 (*) >90 mL/min   GFR calc Af Amer 60 (*) >90 mL/min   Comment: (NOTE)     The eGFR has been calculated using the CKD EPI equation.     This calculation has not been validated in all clinical situations.     eGFR's persistently <90 mL/min signify possible Chronic Kidney     Disease.   Anion gap 12  5 - 15  CBC WITH DIFFERENTIAL     Status: Abnormal   Collection Time    07/09/14  7:10 AM      Result Value Ref Range   WBC 14.4 (*) 4.0 - 10.5 K/uL   RBC 4.51  3.87 - 5.11 MIL/uL   Hemoglobin 12.0  12.0 - 15.0 g/dL   HCT 37.2  36.0 - 46.0 %   MCV 82.5  78.0 - 100.0 fL   MCH 26.6  26.0 - 34.0 pg   MCHC 32.3  30.0 - 36.0 g/dL   RDW 14.8  11.5 - 15.5 %   Platelets 233  150 - 400 K/uL   Neutrophils Relative % 82 (*) 43 - 77 %   Neutro Abs 11.8 (*) 1.7 - 7.7 K/uL   Lymphocytes Relative 12  12 - 46 %   Lymphs Abs 1.8  0.7 - 4.0 K/uL   Monocytes Relative 6  3 - 12 %   Monocytes Absolute 0.8  0.1 - 1.0 K/uL   Eosinophils Relative 0  0 - 5 %   Eosinophils Absolute 0.0  0.0 - 0.7 K/uL   Basophils Relative 0  0 - 1 %   Basophils Absolute 0.0  0.0 - 0.1 K/uL  CBC     Status: None   Collection Time    07/10/14  6:36 AM  Result Value Ref Range   WBC 10.0  4.0 - 10.5 K/uL   RBC 4.60  3.87 - 5.11 MIL/uL   Hemoglobin 12.3  12.0 - 15.0 g/dL   HCT 38.0  36.0 - 46.0 %   MCV 82.6  78.0 - 100.0 fL   MCH 26.7  26.0 - 34.0 pg   MCHC 32.4  30.0 - 36.0 g/dL   RDW 14.8  11.5 - 15.5 %   Platelets 231  150 - 400  K/uL    ABGS No results found for this basename: PHART, PCO2, PO2ART, TCO2, HCO3,  in the last 72 hours CULTURES Recent Results (from the past 240 hour(s))  CULTURE, BLOOD (ROUTINE X 2)     Status: None   Collection Time    07/08/14 11:16 PM      Result Value Ref Range Status   Specimen Description BLOOD RIGHT HAND   Final   Special Requests BOTTLES DRAWN AEROBIC AND ANAEROBIC 8CC EACH   Final   Culture NO GROWTH 1 DAY   Final   Report Status PENDING   Incomplete  CULTURE, BLOOD (ROUTINE X 2)     Status: None   Collection Time    07/08/14 11:16 PM      Result Value Ref Range Status   Specimen Description BLOOD RIGHT HAND   Final   Special Requests BOTTLES DRAWN AEROBIC ONLY 6CC   Final   Culture NO GROWTH 1 DAY   Final   Report Status PENDING   Incomplete  CULTURE, EXPECTORATED SPUTUM-ASSESSMENT     Status: None   Collection Time    07/09/14  5:53 AM      Result Value Ref Range Status   Specimen Description SPUTUM EXPECTORATED   Final   Special Requests NONE   Final   Sputum evaluation     Final   Value: THIS SPECIMEN IS ACCEPTABLE. RESPIRATORY CULTURE REPORT TO FOLLOW. REVIEWED BY BAUGHAM,M. 07/09/2014 CORRECTED RESULTS CALLED TO: HOLT,J. AT 1026 ON 07/09/2014 BY BAUGHAM,M.     Performed at [facility]   Report Status 07/09/2014 FINAL   Final  CULTURE, RESPIRATORY (NON-EXPECTORATED)     Status: None   Collection Time    07/09/14  5:53 AM      Result Value Ref Range Status   Specimen Description SPUTUM EXPECTORATED   Final   Special Requests NONE   Final   Gram Stain     Final   Value: ABUNDANT WBC PRESENT, PREDOMINANTLY PMN     FEW SQUAMOUS EPITHELIAL CELLS PRESENT     FEW GRAM POSITIVE COCCI     IN PAIRS FEW GRAM VARIABLE ROD     Performed at [facility]   Culture     Final   Value: NORMAL OROPHARYNGEAL FLORA     Performed at [facility]   Report Status PENDING   Incomplete   Studies/Results: Dg Chest 2 View  07/08/2014   CLINICAL DATA:   Cough, nausea, shortness of breath.  EXAM: CHEST  2 VIEW  COMPARISON:  Prior radiograph from 06/06/2014  FINDINGS: Cardiomegaly is stable from prior exam. Mediastinal silhouette within normal limits.  Lungs are normally inflated. Patchy multi focal infiltrates are seen involving the right upper and bilateral lower lobes, concerning for multi focal pneumonia. There is superimposed mild pulmonary vascular congestion without overt pulmonary edema. No pleural effusion. No pneumothorax.  No acute osseous abnormality.  IMPRESSION: Patchy multi focal infiltrates involving the right upper and bilateral lower lobes,  most concerning for multi focal pneumonia.   Electronically Signed   By: Jeannine Boga M.D.   On: 07/08/2014 20:05    Medications:  Prior to Admission:  Prescriptions prior to admission  Medication Sig Dispense Refill  . bisoprolol (ZEBETA) 5 MG tablet Take 5 mg by mouth every morning.      . furosemide (LASIX) 20 MG tablet Take 20 mg by mouth every morning.      Marland Kitchen HYDROcodone-homatropine (HYDROMET) 5-1.5 MG/5ML syrup Take 5 mLs by mouth every 4 (four) hours as needed for cough.      . metFORMIN (GLUCOPHAGE) 500 MG tablet Take by mouth 2 (two) times daily.      . potassium chloride SA (K-DUR,KLOR-CON) 20 MEQ tablet Take 1 tablet (20 mEq total) by mouth daily.  30 tablet  5  . albuterol (PROVENTIL HFA;VENTOLIN HFA) 108 (90 BASE) MCG/ACT inhaler Inhale 2 puffs into the lungs every 4 (four) hours as needed for wheezing or shortness of breath.       Scheduled: . antiseptic oral rinse  15 mL Mouth Rinse BID  . bisoprolol  5 mg Oral q morning - 10a  . ceFEPime (MAXIPIME) IV  2 g Intravenous Q24H  . enoxaparin (LOVENOX) injection  40 mg Subcutaneous Q24H  . furosemide  20 mg Oral q morning - 10a  . guaiFENesin  1,200 mg Oral BID  . potassium chloride SA  20 mEq Oral Daily  . sodium chloride  3 mL Intravenous Q12H  . vancomycin  1,000 mg Intravenous Q24H   Continuous:  AUQ:[id number]  chloride, albuterol, HYDROcodone-homatropine, sodium chloride  Assesment: She is admitted with healthcare associated pneumonia in she's been treated and improving. She has cardiomyopathy with pretty severe reduction in her left ventricular ejection fraction but does not seem to be having overt symptoms of congestive heart failure now Principal Problem:   PNA (pneumonia) Active Problems:   HYPERLIPIDEMIA   Essential hypertension, benign   CORONARY ATHEROSCLEROSIS NATIVE CORONARY ARTERY   CARDIOMYOPATHY   Mitral regurgitation   HCAP (healthcare-associated pneumonia)    Plan: Continue IV antibiotics    LOS: 2 days   [place] Perry L 07/10/2014, 9:02 AM

## 2014-07-10 NOTE — Progress Notes (Signed)
Subjective: The patient has a slight cough but is less dyspneic she does have pneumonia involving right upper and lower lobes. She also has cardiomyopathy and congestive heart failure. She remains afebrile.  Objective: Vital signs in last 24 hours: Temp:  [97.8 F (36.6 C)-98.7 F (37.1 C)] 98.4 F (36.9 C) (07/30 0459) Pulse Rate:  [78-80] 78 (07/29 2033) Resp:  [20] 20 (07/30 0459) BP: (118-140)/(67-78) 140/67 mmHg (07/30 0459) SpO2:  [95 %-98 %] 97 % (07/30 0459) Weight change:  Last BM Date: 07/07/14  Intake/Output from previous day: 07/29 0701 - 07/30 0700 In: 960 [P.O.:960] Out: 900 [Urine:900] Intake/Output this shift: Total I/O In: 240 [P.O.:240] Out: -   Physical Exam: General appearance the patient is alert and oriented  HEENT negative  Neck supple no JVD or thyroid abnormalities  Lungs rhonchi bilaterally posteriorly  Heart regular rhythm no murmurs  Abdomen the palpable organs or masses  Extremities free of edema   Recent Labs  07/08/14 1920 07/09/14 0710  WBC 11.8* 14.4*  HGB 13.0 12.0  HCT 39.5 37.2  PLT 254 233   BMET  Recent Labs  07/08/14 1920 07/09/14 0710  NA 143 142  K 3.7 3.8  CL 103 104  CO2 23 26  GLUCOSE 140* 135*  BUN 15 13  CREATININE 1.08 1.03  CALCIUM 9.5 9.2    Studies/Results: Dg Chest 2 View  07/08/2014   CLINICAL DATA:  Cough, nausea, shortness of breath.  EXAM: CHEST  2 VIEW  COMPARISON:  Prior radiograph from 06/06/2014  FINDINGS: Cardiomegaly is stable from prior exam. Mediastinal silhouette within normal limits.  Lungs are normally inflated. Patchy multi focal infiltrates are seen involving the right upper and bilateral lower lobes, concerning for multi focal pneumonia. There is superimposed mild pulmonary vascular congestion without overt pulmonary edema. No pleural effusion. No pneumothorax.  No acute osseous abnormality.  IMPRESSION: Patchy multi focal infiltrates involving the right upper and bilateral lower  lobes, most concerning for multi focal pneumonia.   Electronically Signed   By: Jeannine Boga M.D.   On: 07/08/2014 20:05    Medications:  . antiseptic oral rinse  15 mL Mouth Rinse BID  . bisoprolol  5 mg Oral q morning - 10a  . ceFEPime (MAXIPIME) IV  2 g Intravenous Q24H  . enoxaparin (LOVENOX) injection  40 mg Subcutaneous Q24H  . furosemide  20 mg Oral q morning - 10a  . guaiFENesin  1,200 mg Oral BID  . potassium chloride SA  20 mEq Oral Daily  . sodium chloride  3 mL Intravenous Q12H  . vancomycin  1,000 mg Intravenous Q24H        Assessment/Plan: 1. Bronchopneumonia involving right upper and lower lobes-plan to continue current IV Maxipime and vancomycin.  2. Nonischemic cardiomyopathy with ejection fraction 27-25% consider systolic CHF-plan to continue furosemide-continue monitor weights   LOS: 2 days   Jakin Pavao G 07/10/2014, 6:10 AM

## 2014-07-11 LAB — BASIC METABOLIC PANEL
Anion gap: 13 (ref 5–15)
BUN: 14 mg/dL (ref 6–23)
CO2: 23 mEq/L (ref 19–32)
Calcium: 9.5 mg/dL (ref 8.4–10.5)
Chloride: 106 mEq/L (ref 96–112)
Creatinine, Ser: 1.05 mg/dL (ref 0.50–1.10)
GFR calc Af Amer: 59 mL/min — ABNORMAL LOW (ref >90)
GFR calc non Af Amer: 51 mL/min — ABNORMAL LOW (ref >90)
Glucose, Bld: 132 mg/dL — ABNORMAL HIGH (ref 70–99)
Potassium: 4.2 mEq/L (ref 3.7–5.3)
Sodium: 142 mEq/L (ref 137–147)

## 2014-07-11 LAB — VANCOMYCIN, TROUGH: Vancomycin Tr: 8.9 ug/mL — ABNORMAL LOW (ref 10.0–20.0)

## 2014-07-11 LAB — CULTURE, RESPIRATORY W GRAM STAIN: Culture: NORMAL

## 2014-07-11 NOTE — Progress Notes (Signed)
Subjective: The patient is alert is still coughing some. She does have pneumonia involving right upper and lower lobe and cardiomyopathy with congestive heart failure she remains afebrile.  Objective: Vital signs in last 24 hours: Temp:  [98 F (36.7 C)-99 F (37.2 C)] 99 F (37.2 C) (07/31 0426) Pulse Rate:  [75-85] 75 (07/31 0426) Resp:  [20] 20 (07/31 0426) BP: (133-146)/(72-77) 134/72 mmHg (07/31 0426) SpO2:  [96 %-98 %] 96 % (07/31 0426) Weight change:  Last BM Date: 07/10/14  Intake/Output from previous day: 07/30 0701 - 07/31 0700 In: 1320 [P.O.:720; IV Piggyback:600] Out: 400 [Urine:400] Intake/Output this shift: Total I/O In: 600 [IV Piggyback:600] Out: 400 [Urine:400]  Physical Exam: General appearance patient is alert and oriented  HEENT negative  Neck supple no JVD or thyroid abnormalities  Lungs rhonchi heard posteriorly  Heart regular rhythm no murmurs  Abdomen no palpable organs or masses  Extremities free of edema   Recent Labs  07/09/14 0710 07/10/14 0636  WBC 14.4* 10.0  HGB 12.0 12.3  HCT 37.2 38.0  PLT 233 231   BMET  Recent Labs  07/08/14 1920 07/09/14 0710  NA 143 142  K 3.7 3.8  CL 103 104  CO2 23 26  GLUCOSE 140* 135*  BUN 15 13  CREATININE 1.08 1.03  CALCIUM 9.5 9.2    Studies/Results: No results found.  Medications:  . antiseptic oral rinse  15 mL Mouth Rinse BID  . bisoprolol  5 mg Oral q morning - 10a  . ceFEPime (MAXIPIME) IV  2 g Intravenous Q24H  . enoxaparin (LOVENOX) injection  40 mg Subcutaneous Q24H  . furosemide  20 mg Oral q morning - 10a  . guaiFENesin  1,200 mg Oral BID  . potassium chloride SA  20 mEq Oral Daily  . sodium chloride  3 mL Intravenous Q12H  . vancomycin  1,000 mg Intravenous Q24H        Assessment/Plan: 1. Bronchopneumonia involving right upper and lower lobes-plan to continue current IV Maxipime and vancomycin  2. Nonischemic cardiomyopathy with ejection fraction 20-25%-systolic  CHF-plan to continue current furosemide to continue monitor weights   LOS: 3 days   Madelena Maturin G 07/11/2014, 6:33 AM

## 2014-07-11 NOTE — Progress Notes (Signed)
Subjective: She says she feels better. She still coughing up some sputum. She says her breathing has improved  Objective: Vital signs in last 24 hours: Temp:  [98 F (36.7 C)-99 F (37.2 C)] 99 F (37.2 C) (07/31 0426) Pulse Rate:  [75-85] 75 (07/31 0426) Resp:  [20] 20 (07/31 0426) BP: (133-146)/(72-77) 134/72 mmHg (07/31 0426) SpO2:  [96 %-98 %] 96 % (07/31 0426) Weight change:  Last BM Date: 07/10/14  Intake/Output from previous day: 07/30 0701 - 07/31 0700 In: 1560 [P.O.:960; IV Piggyback:600] Out: 400 [Urine:400]  PHYSICAL EXAM General appearance: alert, cooperative and mild distress Resp: She has some rhonchi. Cardio: regular rate and rhythm, S1, S2 normal, no murmur, click, rub or gallop GI: soft, non-tender; bowel sounds normal; no masses,  no organomegaly Extremities: extremities normal, atraumatic, no cyanosis or edema  Lab Results:  Results for orders placed during the hospital encounter of 07/08/14 (from the past 48 hour(s))  CBC     Status: None   Collection Time    07/10/14  6:36 AM      Result Value Ref Range   WBC 10.0  4.0 - 10.5 K/uL   RBC 4.60  3.87 - 5.11 MIL/uL   Hemoglobin 12.3  12.0 - 15.0 g/dL   HCT 38.0  36.0 - 46.0 %   MCV 82.6  78.0 - 100.0 fL   MCH 26.7  26.0 - 34.0 pg   MCHC 32.4  30.0 - 36.0 g/dL   RDW 14.8  11.5 - 15.5 %   Platelets 231  150 - 400 K/uL  BASIC METABOLIC PANEL     Status: Abnormal   Collection Time    07/11/14  6:42 AM      Result Value Ref Range   Sodium 142  137 - 147 mEq/L   Potassium 4.2  3.7 - 5.3 mEq/L   Chloride 106  96 - 112 mEq/L   CO2 23  19 - 32 mEq/L   Glucose, Bld 132 (*) 70 - 99 mg/dL   BUN 14  6 - 23 mg/dL   Creatinine, Ser 1.05  0.50 - 1.10 mg/dL   Calcium 9.5  8.4 - 10.5 mg/dL   GFR calc non Af Amer 51 (*) >90 mL/min   GFR calc Af Amer 59 (*) >90 mL/min   Comment: (NOTE)     The eGFR has been calculated using the CKD EPI equation.     This calculation has not been validated in all clinical  situations.     eGFR's persistently <90 mL/min signify possible Chronic Kidney     Disease.   Anion gap 13  5 - 15    ABGS No results found for this basename: PHART, PCO2, PO2ART, TCO2, HCO3,  in the last 72 hours CULTURES Recent Results (from the past 240 hour(s))  CULTURE, BLOOD (ROUTINE X 2)     Status: None   Collection Time    07/08/14 11:16 PM      Result Value Ref Range Status   Specimen Description BLOOD RIGHT HAND   Final   Special Requests BOTTLES DRAWN AEROBIC AND ANAEROBIC 8CC EACH   Final   Culture NO GROWTH 1 DAY   Final   Report Status PENDING   Incomplete  CULTURE, BLOOD (ROUTINE X 2)     Status: None   Collection Time    07/08/14 11:16 PM      Result Value Ref Range Status   Specimen Description BLOOD RIGHT HAND   Final  Special Requests BOTTLES DRAWN AEROBIC ONLY 6CC   Final   Culture NO GROWTH 1 DAY   Final   Report Status PENDING   Incomplete  CULTURE, EXPECTORATED SPUTUM-ASSESSMENT     Status: None   Collection Time    07/09/14  5:53 AM      Result Value Ref Range Status   Specimen Description SPUTUM EXPECTORATED   Final   Special Requests NONE   Final   Sputum evaluation     Final   Value: THIS SPECIMEN IS ACCEPTABLE. RESPIRATORY CULTURE REPORT TO FOLLOW. REVIEWED BY BAUGHAM,M. 07/09/2014 CORRECTED RESULTS CALLED TO: HOLT,J. AT 1026 ON 07/09/2014 BY BAUGHAM,M.     Performed at [facility]   Report Status 07/09/2014 FINAL   Final  CULTURE, RESPIRATORY (NON-EXPECTORATED)     Status: None   Collection Time    07/09/14  5:53 AM      Result Value Ref Range Status   Specimen Description SPUTUM EXPECTORATED   Final   Special Requests NONE   Final   Gram Stain     Final   Value: ABUNDANT WBC PRESENT, PREDOMINANTLY PMN     FEW SQUAMOUS EPITHELIAL CELLS PRESENT     FEW GRAM POSITIVE COCCI     IN PAIRS FEW GRAM VARIABLE ROD     Performed at [facility]   Culture     Final   Value: NORMAL OROPHARYNGEAL FLORA     Performed at [facility]   Report Status 07/11/2014 FINAL   Final   Studies/Results: No results found.  Medications:  Prior to Admission:  Prescriptions prior to admission  Medication Sig Dispense Refill  . bisoprolol (ZEBETA) 5 MG tablet Take 5 mg by mouth every morning.      . furosemide (LASIX) 20 MG tablet Take 20 mg by mouth every morning.      Marland Kitchen HYDROcodone-homatropine (HYDROMET) 5-1.5 MG/5ML syrup Take 5 mLs by mouth every 4 (four) hours as needed for cough.      . metFORMIN (GLUCOPHAGE) 500 MG tablet Take by mouth 2 (two) times daily.      . potassium chloride SA (K-DUR,KLOR-CON) 20 MEQ tablet Take 1 tablet (20 mEq total) by mouth daily.  30 tablet  5  . albuterol (PROVENTIL HFA;VENTOLIN HFA) 108 (90 BASE) MCG/ACT inhaler Inhale 2 puffs into the lungs every 4 (four) hours as needed for wheezing or shortness of breath.       Scheduled: . antiseptic oral rinse  15 mL Mouth Rinse BID  . bisoprolol  5 mg Oral q morning - 10a  . ceFEPime (MAXIPIME) IV  2 g Intravenous Q24H  . enoxaparin (LOVENOX) injection  40 mg Subcutaneous Q24H  . furosemide  20 mg Oral q morning - 10a  . guaiFENesin  1,200 mg Oral BID  . potassium chloride SA  20 mEq Oral Daily  . sodium chloride  3 mL Intravenous Q12H  . vancomycin  1,000 mg Intravenous Q24H   Continuous:  ZHY:QMVHQI chloride, albuterol, HYDROcodone-homatropine, sodium chloride  Assesment: She was admitted with pneumonia but seems to be improving. She has cardiomyopathy. She does seem to be getting better. Principal Problem:   PNA (pneumonia) Active Problems:   HYPERLIPIDEMIA   Essential hypertension, benign   CORONARY ATHEROSCLEROSIS NATIVE CORONARY ARTERY   CARDIOMYOPATHY   Mitral regurgitation   HCAP (healthcare-associated pneumonia)    Plan: Continue current treatments    LOS: 3 days   Faust Thorington L 07/11/2014, 9:07 AM

## 2014-07-11 NOTE — Progress Notes (Signed)
Patient given scale to take home.

## 2014-07-12 MED ORDER — COLCHICINE 0.6 MG PO TABS
0.6000 mg | ORAL_TABLET | Freq: Two times a day (BID) | ORAL | Status: DC
  Administered 2014-07-12 – 2014-07-14 (×4): 0.6 mg via ORAL
  Filled 2014-07-12 (×4): qty 1

## 2014-07-12 MED ORDER — ACETAMINOPHEN 325 MG PO TABS
650.0000 mg | ORAL_TABLET | ORAL | Status: DC | PRN
  Administered 2014-07-12: 650 mg via ORAL
  Filled 2014-07-12: qty 2

## 2014-07-12 MED ORDER — VANCOMYCIN HCL IN DEXTROSE 750-5 MG/150ML-% IV SOLN
750.0000 mg | Freq: Two times a day (BID) | INTRAVENOUS | Status: DC
  Administered 2014-07-12 – 2014-07-13 (×4): 750 mg via INTRAVENOUS
  Filled 2014-07-12 (×7): qty 150

## 2014-07-12 NOTE — Progress Notes (Signed)
Subjective: The patient still has a slight cough but is less dyspneic she relates having some edema in her lower legs. She does have cardiomyopathy and congestive heart failure. She remains afebrile.  Objective: Vital signs in last 24 hours: Temp:  [98.8 F (37.1 C)-99 F (37.2 C)] 99 F (37.2 C) (08/01 0600) Pulse Rate:  [75-87] 87 (08/01 0600) Resp:  [18-20] 20 (08/01 0600) BP: (125-147)/(70-98) 125/98 mmHg (08/01 0600) SpO2:  [94 %-99 %] 94 % (08/01 0600) Weight change:  Last BM Date: 07/11/14  Intake/Output from previous day: 07/31 0701 - 08/01 0700 In: 1160 [P.O.:960; IV Piggyback:200] Out: 1400 [Urine:1400] Intake/Output this shift:    Physical Exam: General appearance the patient is alert and oriented  HEENT negative  Neck supple no JVD or thyroid abnormalities  Lungs occasional rhonchus heard posteriorly  Heart regular rhythm no murmurs  Abdomen no palpable organs or masses  Mild edema in extremities   Recent Labs  07/10/14 0636  WBC 10.0  HGB 12.3  HCT 38.0  PLT 231   BMET  Recent Labs  07/11/14 0642  NA 142  K 4.2  CL 106  CO2 23  GLUCOSE 132*  BUN 14  CREATININE 1.05  CALCIUM 9.5    Studies/Results: No results found.  Medications:  . antiseptic oral rinse  15 mL Mouth Rinse BID  . bisoprolol  5 mg Oral q morning - 10a  . ceFEPime (MAXIPIME) IV  2 g Intravenous Q24H  . enoxaparin (LOVENOX) injection  40 mg Subcutaneous Q24H  . furosemide  20 mg Oral q morning - 10a  . guaiFENesin  1,200 mg Oral BID  . potassium chloride SA  20 mEq Oral Daily  . sodium chloride  3 mL Intravenous Q12H  . vancomycin  1,000 mg Intravenous Q24H        Assessment/Plan: 1. Bronchopneumonia involving right upper and lower lobe-plan to continue Maxipime and vancomycin will repeat chest x-ray  2. Nonischemic cardiomyopathy with ejection fraction 20-25%-plan to continue furosemide continue to monitor daily weights   LOS: 4 days   Danylle Ouk  G 07/12/2014, 7:24 AM

## 2014-07-12 NOTE — Progress Notes (Signed)
Pharmacist Heart Failure Core Measure Documentation  Assessment: [name] has an EF documented as 30-35% on 06/07/14 by 2DECHO.  Rationale: Heart failure patients with left ventricular systolic dysfunction (LVSD) and an EF < 40% should be prescribed an angiotensin converting enzyme inhibitor (ACEI) or angiotensin receptor blocker (ARB) at discharge unless a contraindication is documented in the medical record.  This patient is not currently on an ACEI or ARB for HF.  This note is being placed in the record in order to provide documentation that a contraindication to the use of these agents is present for this encounter.  ACE Inhibitor or Angiotensin Receptor Blocker is contraindicated (specify all that apply)  []   ACEI allergy AND ARB allergy []   Angioedema []   Moderate or severe aortic stenosis []   Hyperkalemia []   Hypotension [x]   Renal artery stenosis []   Worsening renal function, preexisting renal disease or dysfunction   Pricilla Larsson 07/12/2014 7:44 AM

## 2014-07-12 NOTE — Progress Notes (Signed)
ANTIBIOTIC CONSULT NOTE  Pharmacy Consult for Vancomycin & Cefepime Indication: pneumonia  No Known Allergies  Patient Measurements: Height: 5\' 5"  (165.1 cm) Weight: 162 lb 1.6 oz (73.528 kg) IBW/kg (Calculated) : 57  Vital Signs: Temp: 99 F (37.2 C) (08/01 0600) Temp src: Oral (08/01 0600) BP: 125/98 mmHg (08/01 0600) Pulse Rate: 87 (08/01 0600) Intake/Output from previous day: 07/31 0701 - 08/01 0700 In: 1160 [P.O.:960; IV Piggyback:200] Out: 1400 [Urine:1400] Intake/Output from this shift:    Labs:  Recent Labs  07/10/14 0636 07/11/14 0642  WBC 10.0  --   HGB 12.3  --   PLT 231  --   CREATININE  --  1.05   Estimated Creatinine Clearance: 46.5 ml/min (by C-G formula based on Cr of 1.05).  Recent Labs  07/11/14 1946  VANCOTROUGH 8.9*     Microbiology: Recent Results (from the past 720 hour(s))  CULTURE, BLOOD (ROUTINE X 2)     Status: None   Collection Time    07/08/14 11:16 PM      Result Value Ref Range Status   Specimen Description BLOOD RIGHT HAND   Final   Special Requests BOTTLES DRAWN AEROBIC AND ANAEROBIC 8CC EACH   Final   Culture NO GROWTH 1 DAY   Final   Report Status PENDING   Incomplete  CULTURE, BLOOD (ROUTINE X 2)     Status: None   Collection Time    07/08/14 11:16 PM      Result Value Ref Range Status   Specimen Description BLOOD RIGHT HAND   Final   Special Requests BOTTLES DRAWN AEROBIC ONLY 6CC   Final   Culture NO GROWTH 1 DAY   Final   Report Status PENDING   Incomplete  CULTURE, EXPECTORATED SPUTUM-ASSESSMENT     Status: None   Collection Time    07/09/14  5:53 AM      Result Value Ref Range Status   Specimen Description SPUTUM EXPECTORATED   Final   Special Requests NONE   Final   Sputum evaluation     Final   Value: THIS SPECIMEN IS ACCEPTABLE. RESPIRATORY CULTURE REPORT TO FOLLOW. REVIEWED BY BAUGHAM,M. 07/09/2014 CORRECTED RESULTS CALLED TO: HOLT,J. AT 1026 ON 07/09/2014 BY BAUGHAM,M.     Performed at [facility]   Report Status 07/09/2014 FINAL   Final  CULTURE, RESPIRATORY (NON-EXPECTORATED)     Status: None   Collection Time    07/09/14  5:53 AM      Result Value Ref Range Status   Specimen Description SPUTUM EXPECTORATED   Final   Special Requests NONE   Final   Gram Stain     Final   Value: ABUNDANT WBC PRESENT, PREDOMINANTLY PMN     FEW SQUAMOUS EPITHELIAL CELLS PRESENT     FEW GRAM POSITIVE COCCI     IN PAIRS FEW GRAM VARIABLE ROD     Performed at [facility]   Culture     Final   Value: NORMAL OROPHARYNGEAL FLORA     Performed at [facility]   Report Status 07/11/2014 FINAL   Final    Medical History: Past Medical History  Diagnosis Date  . Nonischemic cardiomyopathy     LVEF 20-25%  . Asthma   . Cerebrovascular disease     TIA and stroke   . Essential hypertension, benign   . Coronary atherosclerosis of native coronary artery     Nonobstructive at cath 2005  . Hypothyroidism   .  Carotid artery disease   . Gout   . Renal artery stenosis     Bilateral  . Bronchitis     Medications:  Scheduled:  . antiseptic oral rinse  15 mL Mouth Rinse BID  . bisoprolol  5 mg Oral q morning - 10a  . ceFEPime (MAXIPIME) IV  2 g Intravenous Q24H  . enoxaparin (LOVENOX) injection  40 mg Subcutaneous Q24H  . furosemide  20 mg Oral q morning - 10a  . guaiFENesin  1,200 mg Oral BID  . potassium chloride SA  20 mEq Oral Daily  . sodium chloride  3 mL Intravenous Q12H  . vancomycin  750 mg Intravenous Q12H   Assessment: Cefepime & Vancomycin protocol for pneumonia Cough, congestion, febrile Micro (-) to date, trough below goal.  Vanc 7/28 >> Cefepime 7/28>>  Goal of Therapy:  Vancomycin trough level 15-20 mcg/ml  Plan:  Increase Vancomycin to 750mg  IV every 12 hours Continue Cefepime 2 GM IV every 24 hours Repeat Vancomycin trough at steady state Labs per protocol  Pricilla Larsson 07/12/2014,7:41 AM

## 2014-07-12 NOTE — Progress Notes (Signed)
Subjective: She says she feels better. She still has cough. She has no other new complaints. She is less short of breath.  Objective: Vital signs in last 24 hours: Temp:  [98.8 F (37.1 C)-99 F (37.2 C)] 99 F (37.2 C) (08/01 0600) Pulse Rate:  [75-87] 87 (08/01 0600) Resp:  [18-20] 20 (08/01 0600) BP: (125-147)/(70-98) 125/98 mmHg (08/01 0600) SpO2:  [94 %-99 %] 94 % (08/01 0600) Weight change:  Last BM Date: 07/11/14  Intake/Output from previous day: 07/31 0701 - 08/01 0700 In: 1160 [P.O.:960; IV Piggyback:200] Out: 1400 [Urine:1400]  PHYSICAL EXAM General appearance: alert, cooperative and mild distress Resp: rhonchi bilaterally Cardio: regular rate and rhythm, S1, S2 normal, no murmur, click, rub or gallop GI: soft, non-tender; bowel sounds normal; no masses,  no organomegaly Extremities: extremities normal, atraumatic, no cyanosis or edema  Lab Results:  Results for orders placed during the hospital encounter of 07/08/14 (from the past 48 hour(s))  BASIC METABOLIC PANEL     Status: Abnormal   Collection Time    07/11/14  6:42 AM      Result Value Ref Range   Sodium 142  137 - 147 mEq/L   Potassium 4.2  3.7 - 5.3 mEq/L   Chloride 106  96 - 112 mEq/L   CO2 23  19 - 32 mEq/L   Glucose, Bld 132 (*) 70 - 99 mg/dL   BUN 14  6 - 23 mg/dL   Creatinine, Ser 1.05  0.50 - 1.10 mg/dL   Calcium 9.5  8.4 - 10.5 mg/dL   GFR calc non Af Amer 51 (*) >90 mL/min   GFR calc Af Amer 59 (*) >90 mL/min   Comment: (NOTE)     The eGFR has been calculated using the CKD EPI equation.     This calculation has not been validated in all clinical situations.     eGFR's persistently <90 mL/min signify possible Chronic Kidney     Disease.   Anion gap 13  5 - 15  VANCOMYCIN, TROUGH     Status: Abnormal   Collection Time    07/11/14  7:46 PM      Result Value Ref Range   Vancomycin Tr 8.9 (*) 10.0 - 20.0 ug/mL    ABGS No results found for this basename: PHART, PCO2, PO2ART, TCO2, HCO3,   in the last 72 hours CULTURES Recent Results (from the past 240 hour(s))  CULTURE, BLOOD (ROUTINE X 2)     Status: None   Collection Time    07/08/14 11:16 PM      Result Value Ref Range Status   Specimen Description BLOOD RIGHT HAND   Final   Special Requests BOTTLES DRAWN AEROBIC AND ANAEROBIC 8CC EACH   Final   Culture NO GROWTH 4 DAYS   Final   Report Status PENDING   Incomplete  CULTURE, BLOOD (ROUTINE X 2)     Status: None   Collection Time    07/08/14 11:16 PM      Result Value Ref Range Status   Specimen Description BLOOD RIGHT HAND   Final   Special Requests BOTTLES DRAWN AEROBIC ONLY 6CC   Final   Culture NO GROWTH 4 DAYS   Final   Report Status PENDING   Incomplete  CULTURE, EXPECTORATED SPUTUM-ASSESSMENT     Status: None   Collection Time    07/09/14  5:53 AM      Result Value Ref Range Status   Specimen Description SPUTUM EXPECTORATED  Final   Special Requests NONE   Final   Sputum evaluation     Final   Value: THIS SPECIMEN IS ACCEPTABLE. RESPIRATORY CULTURE REPORT TO FOLLOW. REVIEWED BY BAUGHAM,M. 07/09/2014 CORRECTED RESULTS CALLED TO: HOLT,J. AT 1026 ON 07/09/2014 BY BAUGHAM,M.     Performed at [facility]   Report Status 07/09/2014 FINAL   Final  CULTURE, RESPIRATORY (NON-EXPECTORATED)     Status: None   Collection Time    07/09/14  5:53 AM      Result Value Ref Range Status   Specimen Description SPUTUM EXPECTORATED   Final   Special Requests NONE   Final   Gram Stain     Final   Value: ABUNDANT WBC PRESENT, PREDOMINANTLY PMN     FEW SQUAMOUS EPITHELIAL CELLS PRESENT     FEW GRAM POSITIVE COCCI     IN PAIRS FEW GRAM VARIABLE ROD     Performed at [facility]   Culture     Final   Value: NORMAL OROPHARYNGEAL FLORA     Performed at [facility]   Report Status 07/11/2014 FINAL   Final   Studies/Results: No results found.  Medications:  Prior to Admission:  Prescriptions prior to admission  Medication Sig Dispense Refill  .  bisoprolol (ZEBETA) 5 MG tablet Take 5 mg by mouth every morning.      . furosemide (LASIX) 20 MG tablet Take 20 mg by mouth every morning.      Marland Kitchen HYDROcodone-homatropine (HYDROMET) 5-1.5 MG/5ML syrup Take 5 mLs by mouth every 4 (four) hours as needed for cough.      . metFORMIN (GLUCOPHAGE) 500 MG tablet Take by mouth 2 (two) times daily.      . potassium chloride SA (K-DUR,KLOR-CON) 20 MEQ tablet Take 1 tablet (20 mEq total) by mouth daily.  30 tablet  5  . albuterol (PROVENTIL HFA;VENTOLIN HFA) 108 (90 BASE) MCG/ACT inhaler Inhale 2 puffs into the lungs every 4 (four) hours as needed for wheezing or shortness of breath.       Scheduled: . antiseptic oral rinse  15 mL Mouth Rinse BID  . bisoprolol  5 mg Oral q morning - 10a  . ceFEPime (MAXIPIME) IV  2 g Intravenous Q24H  . enoxaparin (LOVENOX) injection  40 mg Subcutaneous Q24H  . furosemide  20 mg Oral q morning - 10a  . guaiFENesin  1,200 mg Oral BID  . potassium chloride SA  20 mEq Oral Daily  . sodium chloride  3 mL Intravenous Q12H  . vancomycin  750 mg Intravenous Q12H   Continuous:  ONG:EXBMWU chloride, albuterol, HYDROcodone-homatropine, sodium chloride  Assesment: She is admitted with healthcare associated pneumonia. She is slowly improving. Her course is complicated by the fact that she has significant cardiac disease. She seems pretty stable as far as that's concerned Principal Problem:   PNA (pneumonia) Active Problems:   HYPERLIPIDEMIA   Essential hypertension, benign   CORONARY ATHEROSCLEROSIS NATIVE CORONARY ARTERY   CARDIOMYOPATHY   Mitral regurgitation   HCAP (healthcare-associated pneumonia)    Plan: Continue current treatments    LOS: 4 days   [name] L 07/12/2014, 10:37 AM

## 2014-07-13 ENCOUNTER — Inpatient Hospital Stay (HOSPITAL_COMMUNITY): Payer: PRIVATE HEALTH INSURANCE

## 2014-07-13 LAB — BASIC METABOLIC PANEL
Anion gap: 17 — ABNORMAL HIGH (ref 5–15)
BUN: 14 mg/dL (ref 6–23)
CO2: 21 mEq/L (ref 19–32)
Calcium: 10.2 mg/dL (ref 8.4–10.5)
Chloride: 99 mEq/L (ref 96–112)
Creatinine, Ser: 0.93 mg/dL (ref 0.50–1.10)
GFR calc Af Amer: 68 mL/min — ABNORMAL LOW (ref >90)
GFR calc non Af Amer: 59 mL/min — ABNORMAL LOW (ref >90)
Glucose, Bld: 139 mg/dL — ABNORMAL HIGH (ref 70–99)
Potassium: 4.3 mEq/L (ref 3.7–5.3)
Sodium: 137 mEq/L (ref 137–147)

## 2014-07-13 LAB — CULTURE, BLOOD (ROUTINE X 2)
Culture: NO GROWTH
Culture: NO GROWTH

## 2014-07-13 LAB — URIC ACID: Uric Acid, Serum: 6.4 mg/dL (ref 2.4–7.0)

## 2014-07-13 MED ORDER — FUROSEMIDE 40 MG PO TABS
40.0000 mg | ORAL_TABLET | Freq: Every morning | ORAL | Status: DC
  Administered 2014-07-13 – 2014-07-14 (×2): 40 mg via ORAL
  Filled 2014-07-13 (×2): qty 1

## 2014-07-13 NOTE — Progress Notes (Signed)
Subjective: The patient is slowly improving but still has cough is concerned about some edema in her extremities  Objective: Vital signs in last 24 hours: Temp:  [98 F (36.7 C)-98.9 F (37.2 C)] 98 F (36.7 C) (08/02 0702) Pulse Rate:  [70-79] 78 (08/02 0702) Resp:  [16-18] 18 (08/02 0702) BP: (130-161)/(76-97) 161/92 mmHg (08/02 0702) SpO2:  [93 %-98 %] 98 % (08/02 0702) Weight change:  Last BM Date: 07/11/14  Intake/Output from previous day: 08/01 0701 - 08/02 0700 In: 600 [P.O.:600] Out: 450 [Urine:450] Intake/Output this shift:    Physical Exam: General appearance the patient is alert and oriented  HEENT negative  Neck supple no JVD or thyroid abnormalities  Lungs occasional rhonchus heard posteriorly   Heart regular rhythm no murmurs  Abdomen no palpable organs or masses  Mild edema in extremities  No results found for this basename: WBC, HGB, HCT, PLT,  in the last 72 hours BMET  Recent Labs  07/11/14 0642  NA 142  K 4.2  CL 106  CO2 23  GLUCOSE 132*  BUN 14  CREATININE 1.05  CALCIUM 9.5    Studies/Results: No results found.  Medications:  . antiseptic oral rinse  15 mL Mouth Rinse BID  . bisoprolol  5 mg Oral q morning - 10a  . ceFEPime (MAXIPIME) IV  2 g Intravenous Q24H  . colchicine  0.6 mg Oral BID  . enoxaparin (LOVENOX) injection  40 mg Subcutaneous Q24H  . furosemide  20 mg Oral q morning - 10a  . guaiFENesin  1,200 mg Oral BID  . potassium chloride SA  20 mEq Oral Daily  . sodium chloride  3 mL Intravenous Q12H  . vancomycin  750 mg Intravenous Q12H        Assessment/Plan: 1. Bronchopneumonia right upper lobe right lower lobe plan to continue Maxipime and vancomycin repeat chest x-ray  Nonischemic cardiomyopathy to continue furosemide monitor daily weights will obtain uric acid level  Bryceson Grape G 07/13/2014, 7:03 AM

## 2014-07-13 NOTE — Progress Notes (Signed)
Subjective: She says she feels better. She has no complaints this morning. She's coughing which is mostly nonproductive. She developed what I think is gout in her feet  Objective: Vital signs in last 24 hours: Temp:  [98 F (36.7 C)-98.9 F (37.2 C)] 98 F (36.7 C) (08/02 0702) Pulse Rate:  [70-79] 78 (08/02 0702) Resp:  [16-18] 18 (08/02 0702) BP: (130-161)/(76-97) 161/92 mmHg (08/02 0702) SpO2:  [93 %-98 %] 98 % (08/02 0702) Weight change:  Last BM Date: 07/11/14  Intake/Output from previous day: 08/01 0701 - 08/02 0700 In: 600 [P.O.:600] Out: 450 [Urine:450]  PHYSICAL EXAM General appearance: alert, cooperative and no distress Resp: clear to auscultation bilaterally Cardio: regular rate and rhythm, S1, S2 normal, no murmur, click, rub or gallop GI: soft, non-tender; bowel sounds normal; no masses,  no organomegaly Extremities: extremities normal, atraumatic, no cyanosis or edema  Lab Results:  Results for orders placed during the hospital encounter of 07/08/14 (from the past 48 hour(s))  VANCOMYCIN, TROUGH     Status: Abnormal   Collection Time    07/11/14  7:46 PM      Result Value Ref Range   Vancomycin Tr 8.9 (*) 10.0 - 20.0 ug/mL  BASIC METABOLIC PANEL     Status: Abnormal   Collection Time    07/13/14  6:11 AM      Result Value Ref Range   Sodium 137  137 - 147 mEq/L   Potassium 4.3  3.7 - 5.3 mEq/L   Chloride 99  96 - 112 mEq/L   CO2 21  19 - 32 mEq/L   Glucose, Bld 139 (*) 70 - 99 mg/dL   BUN 14  6 - 23 mg/dL   Creatinine, Ser 0.93  0.50 - 1.10 mg/dL   Calcium 10.2  8.4 - 10.5 mg/dL   GFR calc non Af Amer 59 (*) >90 mL/min   GFR calc Af Amer 68 (*) >90 mL/min   Comment: (NOTE)     The eGFR has been calculated using the CKD EPI equation.     This calculation has not been validated in all clinical situations.     eGFR's persistently <90 mL/min signify possible Chronic Kidney     Disease.   Anion gap 17 (*) 5 - 15  URIC ACID     Status: None    Collection Time    07/13/14  7:14 AM      Result Value Ref Range   Uric Acid, Serum 6.4  2.4 - 7.0 mg/dL    ABGS No results found for this basename: PHART, PCO2, PO2ART, TCO2, HCO3,  in the last 72 hours CULTURES Recent Results (from the past 240 hour(s))  CULTURE, BLOOD (ROUTINE X 2)     Status: None   Collection Time    07/08/14 11:16 PM      Result Value Ref Range Status   Specimen Description BLOOD RIGHT HAND   Final   Special Requests BOTTLES DRAWN AEROBIC AND ANAEROBIC [place]   Final   Culture NO GROWTH 5 DAYS   Final   Report Status 07/13/2014 FINAL   Final  CULTURE, BLOOD (ROUTINE X 2)     Status: None   Collection Time    07/08/14 11:16 PM      Result Value Ref Range Status   Specimen Description BLOOD RIGHT HAND   Final   Special Requests BOTTLES DRAWN AEROBIC ONLY [place]   Final   Culture NO GROWTH 5 DAYS   Final  Report Status 07/13/2014 FINAL   Final  CULTURE, EXPECTORATED SPUTUM-ASSESSMENT     Status: None   Collection Time    07/09/14  5:53 AM      Result Value Ref Range Status   Specimen Description SPUTUM EXPECTORATED   Final   Special Requests NONE   Final   Sputum evaluation     Final   Value: THIS SPECIMEN IS ACCEPTABLE. RESPIRATORY CULTURE REPORT TO FOLLOW. REVIEWED BY BAUGHAM,M. 07/09/2014 CORRECTED RESULTS CALLED TO: HOLT,J. AT 1026 ON 07/09/2014 BY BAUGHAM,M.     Performed at [facility]   Report Status 07/09/2014 FINAL   Final  CULTURE, RESPIRATORY (NON-EXPECTORATED)     Status: None   Collection Time    07/09/14  5:53 AM      Result Value Ref Range Status   Specimen Description SPUTUM EXPECTORATED   Final   Special Requests NONE   Final   Gram Stain     Final   Value: ABUNDANT WBC PRESENT, PREDOMINANTLY PMN     FEW SQUAMOUS EPITHELIAL CELLS PRESENT     FEW GRAM POSITIVE COCCI     IN PAIRS FEW GRAM VARIABLE ROD     Performed at [facility]   Culture     Final   Value: NORMAL OROPHARYNGEAL FLORA     Performed at [facility]   Report Status 07/11/2014 FINAL   Final   Studies/Results: No results found.  Medications:  Prior to Admission:  Prescriptions prior to admission  Medication Sig Dispense Refill  . bisoprolol (ZEBETA) 5 MG tablet Take 5 mg by mouth every morning.      . furosemide (LASIX) 20 MG tablet Take 20 mg by mouth every morning.      Marland Kitchen HYDROcodone-homatropine (HYDROMET) 5-1.5 MG/5ML syrup Take 5 mLs by mouth every 4 (four) hours as needed for cough.      . metFORMIN (GLUCOPHAGE) 500 MG tablet Take by mouth 2 (two) times daily.      . potassium chloride SA (K-DUR,KLOR-CON) 20 MEQ tablet Take 1 tablet (20 mEq total) by mouth daily.  30 tablet  5  . albuterol (PROVENTIL HFA;VENTOLIN HFA) 108 (90 BASE) MCG/ACT inhaler Inhale 2 puffs into the lungs every 4 (four) hours as needed for wheezing or shortness of breath.       Scheduled: . antiseptic oral rinse  15 mL Mouth Rinse BID  . bisoprolol  5 mg Oral q morning - 10a  . ceFEPime (MAXIPIME) IV  2 g Intravenous Q24H  . colchicine  0.6 mg Oral BID  . enoxaparin (LOVENOX) injection  40 mg Subcutaneous Q24H  . furosemide  40 mg Oral q morning - 10a  . guaiFENesin  1,200 mg Oral BID  . potassium chloride SA  20 mEq Oral Daily  . sodium chloride  3 mL Intravenous Q12H  . vancomycin  750 mg Intravenous Q12H   Continuous:  ONG:EXBMWU chloride, acetaminophen, albuterol, HYDROcodone-homatropine, sodium chloride  Assesment: She was admitted with healthcare associated pneumonia and is improving. She has heart disease with coronary artery occlusive disease cardiomyopathy and congestive heart failure but that seems not to be causing her problems now. She has developed gout in her foot and is on colchicine. Principal Problem:   PNA (pneumonia) Active Problems:   HYPERLIPIDEMIA   Essential hypertension, benign   CORONARY ATHEROSCLEROSIS NATIVE CORONARY ARTERY   CARDIOMYOPATHY   Mitral regurgitation   HCAP (healthcare-associated  pneumonia)    Plan: No change in treatments. I  will follow more peripherally as she is approaching baseline    LOS: 5 days   Rusell Meneely L 07/13/2014, 9:40 AM

## 2014-07-14 LAB — BASIC METABOLIC PANEL
Anion gap: 12 (ref 5–15)
BUN: 15 mg/dL (ref 6–23)
CO2: 24 mEq/L (ref 19–32)
Calcium: 9.7 mg/dL (ref 8.4–10.5)
Chloride: 102 mEq/L (ref 96–112)
Creatinine, Ser: 0.9 mg/dL (ref 0.50–1.10)
GFR calc Af Amer: 71 mL/min — ABNORMAL LOW (ref >90)
GFR calc non Af Amer: 61 mL/min — ABNORMAL LOW (ref >90)
Glucose, Bld: 135 mg/dL — ABNORMAL HIGH (ref 70–99)
Potassium: 4.3 mEq/L (ref 3.7–5.3)
Sodium: 138 mEq/L (ref 137–147)

## 2014-07-14 NOTE — Progress Notes (Signed)
She says she feels better. She is being discharged today. She will need followup chest x-ray in approximately a month to document that she has cleared the infiltrates.  Thanks for allowing me to see her with you

## 2014-07-14 NOTE — Progress Notes (Signed)
UR chart review completed.  

## 2014-07-14 NOTE — Discharge Summary (Signed)
Physician Discharge Summary  [name]:[id number] DOB: June 17, 1936 DOA: 07/08/2014  PCP: Lanette Hampshire, MD  Admit date: 07/08/2014 Discharge date: 07/14/2014   Follow-up Information   Follow up with [place].   Contact information:   [address] [place] [address] [phone]       Discharge Diagnoses:  1. Bronchopneumonia right upper and lower lobes 2. Systolic congestive heart failure with cardiomyopathy 3. Essential hypertension 4. Coronary atherosclerosis native coronary artery 5. History of hyperuricemia and gout 6. Diabetes mellitus2  Discharge Condition: Stable Disposition: Home  Diet recommendation: 2 g sodium diet  Filed Weights   07/08/14 1819 07/08/14 2248 07/09/14 0610  Weight: 75.297 kg (166 lb) 75.297 kg (166 lb) 73.528 kg (162 lb 1.6 oz)    History of present illness:  The patient on admission was admitted through the emergency department having presented there with increasing cough and shortness of breath x-ray showed patchy multifocal infiltrates involving right upper and bilateral lower lobe. She was started on nasal O2 IV antibiotics and admitted to [facility] floor  Hospital Course:  The patient was started on IV antibiotics Maxipime and vancomycin. She initially was coughing intermittently and had chest congestion. She did receive neb treatments. She was seen by pulmonology who is in agreement with regimen. Because of history of congestive heart failure she remains on furosemide. This dosage was gradually increased because of some edema in her lower extremities which improved she did complain of some pain in her feet. She does have a history of hyperuricemia : Medication was started. Patient gradually improved on medications listed below plus antibiotics that she could be discharged    Medication List         albuterol 108 (90 BASE) MCG/ACT inhaler  Commonly known as:  PROVENTIL HFA;VENTOLIN HFA  Inhale 2 puffs  into the lungs every 4 (four) hours as needed for wheezing or shortness of breath.     bisoprolol 5 MG tablet  Commonly known as:  ZEBETA  Take 5 mg by mouth every morning.     furosemide 20 MG tablet  Commonly known as:  LASIX  Take 20 mg by mouth every morning.     HYDROMET 5-1.5 MG/5ML syrup  Generic drug:  HYDROcodone-homatropine  Take 5 mLs by mouth every 4 (four) hours as needed for cough.     metFORMIN 500 MG tablet  Commonly known as:  GLUCOPHAGE  Take by mouth 2 (two) times daily.     potassium chloride SA 20 MEQ tablet  Commonly known as:  K-DUR,KLOR-CON  Take 1 tablet (20 mEq total) by mouth daily.       No Known Allergies  The results of significant diagnostics from this hospitalization (including imaging, microbiology, ancillary and laboratory) are listed below for reference.    Significant Diagnostic Studies: Dg Chest 2 View  07/13/2014   CLINICAL DATA:  Cough, congestion  EXAM: CHEST  2 VIEW  COMPARISON:  06/30/2014  FINDINGS: Cardiomegaly with pulmonary vascular congestion and possible mild interstitial edema. Patchy left lower lobe/ retrocardiac opacity, atelectasis versus pneumonia. Possible mild right basilar opacity, likely atelectasis.  No pleural effusion or pneumothorax.  Degenerative changes of the visualized thoracolumbar spine.  Cholecystectomy clips.  IMPRESSION: Cardiomegaly with possible mild interstitial edema.  Patchy left lower lobe opacity, atelectasis versus pneumonia.  Probable right basilar atelectasis.   Electronically Signed   By: Julian Hy M.D.   On: 07/13/2014 09:40   Dg Chest 2 View  07/08/2014  CLINICAL DATA:  Cough, nausea, shortness of breath.  EXAM: CHEST  2 VIEW  COMPARISON:  Prior radiograph from 06/06/2014  FINDINGS: Cardiomegaly is stable from prior exam. Mediastinal silhouette within normal limits.  Lungs are normally inflated. Patchy multi focal infiltrates are seen involving the right upper and bilateral lower lobes,  concerning for multi focal pneumonia. There is superimposed mild pulmonary vascular congestion without overt pulmonary edema. No pleural effusion. No pneumothorax.  No acute osseous abnormality.  IMPRESSION: Patchy multi focal infiltrates involving the right upper and bilateral lower lobes, most concerning for multi focal pneumonia.   Electronically Signed   By: Jeannine Boga M.D.   On: 07/08/2014 20:05   Ct Head Wo Contrast  06/18/2014   CLINICAL DATA:  Lightheadedness and dizziness.  EXAM: CT HEAD WITHOUT CONTRAST  TECHNIQUE: Contiguous axial images were obtained from the base of the skull through the vertex without contrast.  COMPARISON:  03/26/2010  FINDINGS: No evidence for acute hemorrhage, mass lesion, midline shift, hydrocephalus or large infarct. The visualized sinuses are clear. Again noted is diffuse thickening of the calvarium. No acute bone abnormality.  IMPRESSION: No acute intracranial abnormality.   Electronically Signed   By: Markus Daft M.D.   On: 06/18/2014 20:25    Microbiology: Recent Results (from the past 240 hour(s))  CULTURE, BLOOD (ROUTINE X 2)     Status: None   Collection Time    07/08/14 11:16 PM      Result Value Ref Range Status   Specimen Description BLOOD RIGHT HAND   Final   Special Requests BOTTLES DRAWN AEROBIC AND ANAEROBIC [place]   Final   Culture NO GROWTH 5 DAYS   Final   Report Status 07/13/2014 FINAL   Final  CULTURE, BLOOD (ROUTINE X 2)     Status: None   Collection Time    07/08/14 11:16 PM      Result Value Ref Range Status   Specimen Description BLOOD RIGHT HAND   Final   Special Requests BOTTLES DRAWN AEROBIC ONLY [facility]   Final   Culture NO GROWTH 5 DAYS   Final   Report Status 07/13/2014 FINAL   Final  CULTURE, EXPECTORATED SPUTUM-ASSESSMENT     Status: None   Collection Time    07/09/14  5:53 AM      Result Value Ref Range Status   Specimen Description SPUTUM EXPECTORATED   Final   Special Requests NONE   Final   Sputum evaluation      Final   Value: THIS SPECIMEN IS ACCEPTABLE. RESPIRATORY CULTURE REPORT TO FOLLOW. REVIEWED BY BAUGHAM,M. 07/09/2014 CORRECTED RESULTS CALLED TO: HOLT,J. AT 1026 ON 07/09/2014 BY BAUGHAM,M.     Performed at [facility]   Report Status 07/09/2014 FINAL   Final  CULTURE, RESPIRATORY (NON-EXPECTORATED)     Status: None   Collection Time    07/09/14  5:53 AM      Result Value Ref Range Status   Specimen Description SPUTUM EXPECTORATED   Final   Special Requests NONE   Final   Gram Stain     Final   Value: ABUNDANT WBC PRESENT, PREDOMINANTLY PMN     FEW SQUAMOUS EPITHELIAL CELLS PRESENT     FEW GRAM POSITIVE COCCI     IN PAIRS FEW GRAM VARIABLE ROD     Performed at [facility]   Culture     Final   Value: NORMAL OROPHARYNGEAL FLORA     Performed at [facility]  Report Status 07/11/2014 FINAL   Final     Labs: Basic Metabolic Panel:  Recent Labs Lab 07/08/14 1920 07/09/14 0710 07/11/14 0642 07/13/14 0611  NA 143 142 142 137  K 3.7 3.8 4.2 4.3  CL 103 104 106 99  CO2 23 26 23 21   GLUCOSE 140* 135* 132* 139*  BUN 15 13 14 14   CREATININE 1.08 1.03 1.05 0.93  CALCIUM 9.5 9.2 9.5 10.2   Liver Function Tests: No results found for this basename: AST, ALT, ALKPHOS, BILITOT, PROT, ALBUMIN,  in the last 168 hours No results found for this basename: LIPASE, AMYLASE,  in the last 168 hours No results found for this basename: AMMONIA,  in the last 168 hours CBC:  Recent Labs Lab 07/08/14 1920 07/09/14 0710 07/10/14 0636  WBC 11.8* 14.4* 10.0  NEUTROABS 9.3* 11.8*  --   HGB 13.0 12.0 12.3  HCT 39.5 37.2 38.0  MCV 82.6 82.5 82.6  PLT 254 233 231   Cardiac Enzymes:  Recent Labs Lab 07/08/14 1920  TROPONINI <0.30   BNP: BNP (last 3 results)  Recent Labs  06/06/14 1215 07/08/14 1920  PROBNP 2651.0* 3524.0*   CBG: No results found for this basename: GLUCAP,  in the last 168 hours  Principal Problem:   PNA (pneumonia) Active Problems:    HYPERLIPIDEMIA   Essential hypertension, benign   CORONARY ATHEROSCLEROSIS NATIVE CORONARY ARTERY   CARDIOMYOPATHY   Mitral regurgitation   HCAP (healthcare-associated pneumonia)   Time coordinating discharge: 30 minutes  Signed:  Marjean Donna, MD 07/14/2014, 6:15 AM

## 2014-08-11 ENCOUNTER — Encounter: Payer: Self-pay | Admitting: *Deleted

## 2014-10-27 NOTE — Progress Notes (Signed)
    HPI: [name] is a 78 y/o patient to be established with Dr. Bronson Ing or Dr. Harl Bowie we are following for ongoing assessment and management of Systolic CHF with cardiomyopathy, CAD, and Hypertension.  Other hx included DM. She was recently admitted to [facility] in the setting of bronchopneumonia. She was treated with abx and was also found to be in CHF. She was diuresed and sent home on lasix 20mg  daly.   She comes today with complaints of intermittent chest pain, transient, lasting less than a minute occuring last week. No associated dyspnea or diaphoresis. She denies coughing or congestion. She has not had any more since last week.   She has been compliant with her mediations.  She states that last week she had transient chest pain at rest. Described as a sticking pain. Lasted less than a minute with no associated dyspnea, dizziness, diaphoresis or weakness.  She has not felt bad since. She denies coughing or fever.   No Known Allergies  Current Outpatient Prescriptions  Medication Sig Dispense Refill  . albuterol (PROVENTIL HFA;VENTOLIN HFA) 108 (90 BASE) MCG/ACT inhaler Inhale 2 puffs into the lungs every 4 (four) hours as needed for wheezing or shortness of breath.    . bisoprolol (ZEBETA) 5 MG tablet Take 5 mg by mouth every morning.    . furosemide (LASIX) 20 MG tablet Take 20 mg by mouth every morning.    Marland Kitchen HYDROcodone-homatropine (HYDROMET) 5-1.5 MG/5ML syrup Take 5 mLs by mouth every 4 (four) hours as needed for cough.    . metFORMIN (GLUCOPHAGE) 500 MG tablet Take by mouth 2 (two) times daily.    . potassium chloride SA (K-DUR,KLOR-CON) 20 MEQ tablet Take 1 tablet (20 mEq total) by mouth daily. 30 tablet 5   No current facility-administered medications for this visit.    Past Medical History  Diagnosis Date  . Nonischemic cardiomyopathy     LVEF 20-25%  . Asthma   . Cerebrovascular disease     TIA and stroke   . Essential hypertension, benign   . Coronary atherosclerosis  of native coronary artery     Nonobstructive at cath 2005  . Hypothyroidism   . Carotid artery disease   . Gout   . Renal artery stenosis     Bilateral  . Bronchitis     Past Surgical History  Procedure Laterality Date  . Abdominal hysterectomy      KYH:CWCBJS of systems complete and found to be negative unless listed above  PHYSICAL EXAM BP 168/90 mmHg  Pulse 63  Ht 5\' 2"  (1.575 m)  Wt 165 lb (74.844 kg)  BMI 30.17 kg/m2 General: Well developed, well nourished, in no acute distress Head: Eyes PERRLA, No xanthomas.   Normal cephalic and atramatic  Lungs: Crackles in the right base, no wheezing.  Heart: HRRR S1 S2, without MRG.  Pulses are 2+ & equal.            No carotid bruit. No JVD.  No abdominal bruits. No femoral bruits. Abdomen: Bowel sounds are positive, abdomen soft and non-tender without masses or                  Hernia's noted. Msk:  Back normal, normal gait. Normal strength and tone for age. Extremities: No clubbing, cyanosis or edema.  DP +1 Neuro: Alert and oriented X 3. Psych:  Good affect, responds appropriately   ASSESSMENT AND PLAN

## 2014-10-28 ENCOUNTER — Ambulatory Visit (INDEPENDENT_AMBULATORY_CARE_PROVIDER_SITE_OTHER): Payer: PRIVATE HEALTH INSURANCE | Admitting: Adult Health

## 2014-10-28 ENCOUNTER — Encounter: Payer: Self-pay | Admitting: Adult Health

## 2014-10-28 VITALS — BP 168/90 | HR 63 | Ht 62.0 in | Wt 165.0 lb

## 2014-10-28 DIAGNOSIS — R0989 Other specified symptoms and signs involving the circulatory and respiratory systems: Secondary | ICD-10-CM

## 2014-10-28 DIAGNOSIS — I2510 Atherosclerotic heart disease of native coronary artery without angina pectoris: Secondary | ICD-10-CM

## 2014-10-28 DIAGNOSIS — I5043 Acute on chronic combined systolic (congestive) and diastolic (congestive) heart failure: Secondary | ICD-10-CM

## 2014-10-28 DIAGNOSIS — I10 Essential (primary) hypertension: Secondary | ICD-10-CM

## 2014-10-28 DIAGNOSIS — Z79899 Other long term (current) drug therapy: Secondary | ICD-10-CM

## 2014-10-28 NOTE — Patient Instructions (Signed)
Your physician recommends that you schedule a follow-up appointment in: 3 WEEKS  A chest x-ray takes a picture of the organs and structures inside the chest, including the heart, lungs, and blood vessels. This test can show several things, including, whether the heart is enlarges; whether fluid is building up in the lungs; and whether pacemaker / defibrillator leads are still in place.  Your physician recommends that you return for lab work BMP  Thank you for choosing [facility]

## 2014-10-28 NOTE — Assessment & Plan Note (Signed)
Initially elevated on first check in triage. I rechecked it in clinic and found it to be 138/88. Will continue current medication regimen.

## 2014-10-28 NOTE — Progress Notes (Deleted)
Name: [name]    DOB: Jul 27, 1936  Age: 78 y.o.  MR#: [id number]       PCP:  Lanette Hampshire, MD      Insurance: Payor: [employer] MEDICARE / Plan: Cottie Banda / Product Type: *No Product type* /   CC:    Chief Complaint  Patient presents with  . Coronary Artery Disease  . Hypertension    VS Filed Vitals:   10/28/14 1405  BP: 168/90  Pulse: 63  Height: 5\' 2"  (1.575 m)  Weight: 165 lb (74.844 kg)    Weights Current Weight  10/28/14 165 lb (74.844 kg)  07/09/14 162 lb 1.6 oz (73.528 kg)  06/19/14 166 lb 0.1 oz (75.3 kg)    Blood Pressure  BP Readings from Last 3 Encounters:  10/28/14 168/90  07/14/14 139/75  06/19/14 131/72     Admit date:  (Not on file) Last encounter with RMR:  Visit date not found   Allergy Review of patient's allergies indicates no known allergies.  Current Outpatient Prescriptions  Medication Sig Dispense Refill  . albuterol (PROVENTIL HFA;VENTOLIN HFA) 108 (90 BASE) MCG/ACT inhaler Inhale 2 puffs into the lungs every 4 (four) hours as needed for wheezing or shortness of breath.    . bisoprolol (ZEBETA) 5 MG tablet Take 5 mg by mouth every morning.    . furosemide (LASIX) 20 MG tablet Take 20 mg by mouth every morning.    Marland Kitchen HYDROcodone-homatropine (HYDROMET) 5-1.5 MG/5ML syrup Take 5 mLs by mouth every 4 (four) hours as needed for cough.    . metFORMIN (GLUCOPHAGE) 500 MG tablet Take by mouth 2 (two) times daily.    . potassium chloride SA (K-DUR,KLOR-CON) 20 MEQ tablet Take 1 tablet (20 mEq total) by mouth daily. 30 tablet 5   No current facility-administered medications for this visit.    Discontinued Meds:   There are no discontinued medications.  Patient Active Problem List   Diagnosis Date Noted  . PNA (pneumonia) 07/08/2014  . HCAP (healthcare-associated pneumonia) 07/08/2014  . ARF (acute renal failure) 06/18/2014  . Mitral regurgitation 06/09/2014  . Dyspnea 06/06/2014  . Asthma exacerbation 06/06/2014  . Acute on  chronic combined systolic and diastolic CHF (congestive heart failure) 06/06/2014  . Influenza 12/10/2012  . HYPERLIPIDEMIA 07/08/2010  . Essential hypertension, benign 04/06/2010  . CORONARY ATHEROSCLEROSIS NATIVE CORONARY ARTERY 04/06/2010  . CARDIOMYOPATHY 03/29/2010    LABS    Component Value Date/Time   NA 138 07/14/2014 0622   NA 137 07/13/2014 0611   NA 142 07/11/2014 0642   K 4.3 07/14/2014 0622   K 4.3 07/13/2014 0611   K 4.2 07/11/2014 0642   CL 102 07/14/2014 0622   CL 99 07/13/2014 0611   CL 106 07/11/2014 0642   CO2 24 07/14/2014 0622   CO2 21 07/13/2014 0611   CO2 23 07/11/2014 0642   GLUCOSE 135* 07/14/2014 0622   GLUCOSE 139* 07/13/2014 0611   GLUCOSE 132* 07/11/2014 0642   BUN 15 07/14/2014 0622   BUN 14 07/13/2014 0611   BUN 14 07/11/2014 0642   CREATININE 0.90 07/14/2014 0622   CREATININE 0.93 07/13/2014 0611   CREATININE 1.05 07/11/2014 0642   CREATININE 1.09 12/20/2012 0800   CREATININE 1.48* 12/10/2012 1204   CALCIUM 9.7 07/14/2014 0622   CALCIUM 10.2 07/13/2014 0611   CALCIUM 9.5 07/11/2014 0642   GFRNONAA 61* 07/14/2014 0622   GFRNONAA 59* 07/13/2014 0611   GFRNONAA 51* 07/11/2014 0642   GFRAA 71* 07/14/2014 0622  GFRAA 68* 07/13/2014 0611   GFRAA 59* 07/11/2014 0642   CMP     Component Value Date/Time   NA 138 07/14/2014 0622   K 4.3 07/14/2014 0622   CL 102 07/14/2014 0622   CO2 24 07/14/2014 0622   GLUCOSE 135* 07/14/2014 0622   BUN 15 07/14/2014 0622   CREATININE 0.90 07/14/2014 0622   CREATININE 1.09 12/20/2012 0800   CALCIUM 9.7 07/14/2014 0622   PROT 7.2 06/06/2014 1215   ALBUMIN 3.5 06/06/2014 1215   AST 24 06/06/2014 1215   ALT 16 06/06/2014 1215   ALKPHOS 153* 06/06/2014 1215   BILITOT 0.4 06/06/2014 1215   GFRNONAA 61* 07/14/2014 0622   GFRAA 71* 07/14/2014 0622       Component Value Date/Time   WBC 10.0 07/10/2014 0636   WBC 14.4* 07/09/2014 0710   WBC 11.8* 07/08/2014 1920   HGB 12.3 07/10/2014 0636   HGB  12.0 07/09/2014 0710   HGB 13.0 07/08/2014 1920   HCT 38.0 07/10/2014 0636   HCT 37.2 07/09/2014 0710   HCT 39.5 07/08/2014 1920   MCV 82.6 07/10/2014 0636   MCV 82.5 07/09/2014 0710   MCV 82.6 07/08/2014 1920    Lipid Panel     Component Value Date/Time   CHOL 149 11/07/2012 0840   TRIG 202* 11/07/2012 0840   HDL 33* 11/07/2012 0840   CHOLHDL 4.5 11/07/2012 0840   VLDL 40 11/07/2012 0840   LDLCALC 76 11/07/2012 0840    ABG No results found for: PHART, PCO2ART, PO2ART, HCO3, TCO2, ACIDBASEDEF, O2SAT   Lab Results  Component Value Date   TSH 1.142 03/30/2010   BNP (last 3 results)  Recent Labs  06/06/14 1215 07/08/14 1920  PROBNP 2651.0* 3524.0*   Cardiac Panel (last 3 results) No results for input(s): CKTOTAL, CKMB, TROPONINI, RELINDX in the last 72 hours.  Iron/TIBC/Ferritin/ %Sat No results found for: IRON, TIBC, FERRITIN, IRONPCTSAT   EKG Orders placed or performed during the hospital encounter of 07/08/14  . EKG 12-Lead  . EKG 12-Lead  . EKG     Prior Assessment and Plan Problem List as of 10/28/2014      Cardiovascular and Mediastinum   Essential hypertension, benign   Last Assessment & Plan   12/10/2012 Office Visit Written 12/10/2012 12:09 PM by Lendon Colonel, NP    Blood pressure is well controlled at present. I am concerned that with her illness she may have some evidence of dehydration. She is due for a BMET on this appointment. I will order this today. She will continue current medication regimen until labs are available to review.     CORONARY ATHEROSCLEROSIS NATIVE CORONARY ARTERY   Last Assessment & Plan   03/25/2011 Office Visit Written 03/25/2011  5:41 PM by Satira Sark, MD    No reported angina, nonobstructive disease documented.    CARDIOMYOPATHY   Last Assessment & Plan   12/10/2012 Office Visit Written 12/10/2012 12:11 PM by Lendon Colonel, NP    She has no evidence of CHF on this assessment. Her chest is clear despite  her complaints of coughing and congestion. Last echo completed in Sept of 2013, EF of 40-45%. Continue coreg and lisinopril.    Acute on chronic combined systolic and diastolic CHF (congestive heart failure)   Mitral regurgitation     Respiratory   Influenza   Last Assessment & Plan   12/10/2012 Office Visit Written 12/10/2012 12:13 PM by Lendon Colonel, NP    This  diagnosis is yet to be confirmed. She has a fever of 100.3 with body aches, chills and congestion. She was seen in the ER two days ago,but no labs were completed. I will have CBC and influenza panel drawn. She is given a face mask to wear. She is encouraged to follow up with Dr. Everette Rank today. Labs should be available to him by the time she sees him.     Asthma exacerbation   PNA (pneumonia)   HCAP (healthcare-associated pneumonia)     Genitourinary   ARF (acute renal failure)     Other   HYPERLIPIDEMIA   Last Assessment & Plan   09/07/2012 Office Visit Written 09/07/2012  1:20 PM by Satira Sark, MD    Will start simvastatin generic and check FLP in 2 months.    Dyspnea       Imaging: No results found.

## 2014-10-28 NOTE — Assessment & Plan Note (Signed)
I do not think transient sharp chest pain is related to CAD at this time.Last cath 3 1/2 years ago demonstrated non-obstructive disease.  She has recently gotten over pneumonia. I will recheck CXR as I am heariing some crackles in the right base. Check labs to evaluate potassium status.  If pain is recurrent, I will plan for NM stress test.

## 2014-10-28 NOTE — Assessment & Plan Note (Signed)
No evidence of fluid overload currently. Continue current mediations. Checking BMET.

## 2014-11-10 ENCOUNTER — Ambulatory Visit (HOSPITAL_COMMUNITY)
Admission: RE | Admit: 2014-11-10 | Discharge: 2014-11-10 | Disposition: A | Discharge status: Home / Self Care | Payer: PRIVATE HEALTH INSURANCE | Source: Ambulatory Visit | Attending: Adult Health | Admitting: Adult Health

## 2014-11-10 DIAGNOSIS — R079 Chest pain, unspecified: Secondary | ICD-10-CM | POA: Insufficient documentation

## 2014-11-10 DIAGNOSIS — R0989 Other specified symptoms and signs involving the circulatory and respiratory systems: Secondary | ICD-10-CM

## 2014-11-11 ENCOUNTER — Encounter: Payer: Self-pay | Admitting: *Deleted

## 2014-11-11 ENCOUNTER — Telehealth: Payer: Self-pay | Admitting: *Deleted

## 2014-11-11 NOTE — Telephone Encounter (Signed)
Pt mailed letter after leaving messages for call back. Forwarded results to Dr. Everette Rank

## 2014-11-11 NOTE — Telephone Encounter (Signed)
-----   Message from Lendon Colonel, NP sent at 11/10/2014 12:57 PM EST ----- No changes in medical regimen. CXR negative for CHF or pneumonia

## 2014-11-18 ENCOUNTER — Encounter: Payer: Self-pay | Admitting: Adult Health

## 2014-11-18 ENCOUNTER — Encounter: Payer: PRIVATE HEALTH INSURANCE | Admitting: Adult Health

## 2014-12-09 ENCOUNTER — Encounter: Payer: Self-pay | Admitting: *Deleted

## 2015-01-06 ENCOUNTER — Encounter: Payer: Medicaid Other | Admitting: Adult Health

## 2015-01-06 ENCOUNTER — Encounter: Payer: Self-pay | Admitting: *Deleted

## 2015-01-06 NOTE — Progress Notes (Signed)
Error. No Show 

## 2015-03-09 ENCOUNTER — Emergency Department (HOSPITAL_COMMUNITY)
Admission: EM | Admit: 2015-03-09 | Discharge: 2015-03-09 | Disposition: A | Discharge status: Home / Self Care | Payer: Medicare Other | Attending: Emergency Medicine | Admitting: Emergency Medicine

## 2015-03-09 ENCOUNTER — Emergency Department (HOSPITAL_COMMUNITY): Payer: Medicare Other

## 2015-03-09 ENCOUNTER — Encounter (HOSPITAL_COMMUNITY): Payer: Self-pay | Admitting: *Deleted

## 2015-03-09 DIAGNOSIS — R000 Tachycardia, unspecified: Secondary | ICD-10-CM | POA: Insufficient documentation

## 2015-03-09 DIAGNOSIS — I509 Heart failure, unspecified: Secondary | ICD-10-CM | POA: Insufficient documentation

## 2015-03-09 DIAGNOSIS — R0600 Dyspnea, unspecified: Secondary | ICD-10-CM

## 2015-03-09 DIAGNOSIS — I10 Essential (primary) hypertension: Secondary | ICD-10-CM | POA: Insufficient documentation

## 2015-03-09 DIAGNOSIS — I2510 Atherosclerotic heart disease of native coronary artery without angina pectoris: Secondary | ICD-10-CM | POA: Diagnosis not present

## 2015-03-09 DIAGNOSIS — J45901 Unspecified asthma with (acute) exacerbation: Secondary | ICD-10-CM | POA: Insufficient documentation

## 2015-03-09 DIAGNOSIS — Z79899 Other long term (current) drug therapy: Secondary | ICD-10-CM | POA: Insufficient documentation

## 2015-03-09 DIAGNOSIS — Z8739 Personal history of other diseases of the musculoskeletal system and connective tissue: Secondary | ICD-10-CM | POA: Diagnosis not present

## 2015-03-09 DIAGNOSIS — Z8673 Personal history of transient ischemic attack (TIA), and cerebral infarction without residual deficits: Secondary | ICD-10-CM | POA: Diagnosis not present

## 2015-03-09 DIAGNOSIS — Z8639 Personal history of other endocrine, nutritional and metabolic disease: Secondary | ICD-10-CM | POA: Insufficient documentation

## 2015-03-09 DIAGNOSIS — R0602 Shortness of breath: Secondary | ICD-10-CM | POA: Diagnosis present

## 2015-03-09 LAB — BASIC METABOLIC PANEL
Anion gap: 9 (ref 5–15)
BUN: 24 mg/dL — ABNORMAL HIGH (ref 6–23)
CO2: 21 mmol/L (ref 19–32)
Calcium: 9.4 mg/dL (ref 8.4–10.5)
Chloride: 114 mmol/L — ABNORMAL HIGH (ref 96–112)
Creatinine, Ser: 1.06 mg/dL (ref 0.50–1.10)
GFR calc Af Amer: 58 mL/min — ABNORMAL LOW (ref >90)
GFR calc non Af Amer: 50 mL/min — ABNORMAL LOW (ref >90)
Glucose, Bld: 141 mg/dL — ABNORMAL HIGH (ref 70–99)
Potassium: 3.6 mmol/L (ref 3.5–5.1)
Sodium: 144 mmol/L (ref 135–145)

## 2015-03-09 LAB — CBC WITH DIFFERENTIAL/PLATELET
Basophils Absolute: 0 10*3/uL (ref 0.0–0.1)
Basophils Relative: 1 % (ref 0–1)
Eosinophils Absolute: 0.1 10*3/uL (ref 0.0–0.7)
Eosinophils Relative: 1 % (ref 0–5)
HCT: 40.1 % (ref 36.0–46.0)
Hemoglobin: 12.7 g/dL (ref 12.0–15.0)
Lymphocytes Relative: 30 % (ref 12–46)
Lymphs Abs: 1.8 10*3/uL (ref 0.7–4.0)
MCH: 26.7 pg (ref 26.0–34.0)
MCHC: 31.7 g/dL (ref 30.0–36.0)
MCV: 84.2 fL (ref 78.0–100.0)
Monocytes Absolute: 0.5 10*3/uL (ref 0.1–1.0)
Monocytes Relative: 8 % (ref 3–12)
Neutro Abs: 3.8 10*3/uL (ref 1.7–7.7)
Neutrophils Relative %: 62 % (ref 43–77)
Platelets: 229 10*3/uL (ref 150–400)
RBC: 4.76 MIL/uL (ref 3.87–5.11)
RDW: 14.2 % (ref 11.5–15.5)
WBC: 6.2 10*3/uL (ref 4.0–10.5)

## 2015-03-09 LAB — BRAIN NATRIURETIC PEPTIDE: B Natriuretic Peptide: 1144 pg/mL — ABNORMAL HIGH (ref 0.0–100.0)

## 2015-03-09 LAB — TROPONIN I: Troponin I: <0.03 ng/mL (ref <0.031)

## 2015-03-09 MED ORDER — FUROSEMIDE 10 MG/ML IJ SOLN
40.0000 mg | Freq: Once | INTRAMUSCULAR | Status: AC
  Administered 2015-03-09: 40 mg via INTRAVENOUS
  Filled 2015-03-09: qty 4

## 2015-03-09 MED ORDER — PREDNISONE 20 MG PO TABS: 40.0000 mg | ORAL_TABLET | Freq: Every day | ORAL | Status: DC

## 2015-03-09 MED ORDER — ALBUTEROL SULFATE (2.5 MG/3ML) 0.083% IN NEBU
5.0000 mg | INHALATION_SOLUTION | Freq: Once | RESPIRATORY_TRACT | Status: AC
  Administered 2015-03-09: 5 mg via RESPIRATORY_TRACT
  Filled 2015-03-09: qty 6

## 2015-03-09 NOTE — ED Provider Notes (Signed)
CSN: [id number]     Arrival date & time 03/09/15  1035 History  This chart was scribed for Virgel Manifold, MD by Edison Simon, ED Scribe. This patient was seen in room [facility]10/[facility]10 and the patient's care was started at 10:51 AM.    Chief Complaint  Patient presents with  . Shortness of Breath   The history is provided by the patient. No language interpreter was used.    HPI Comments: [name] is a 79 y.o. female with history of bronchitis and asthma who presents to the Emergency Department complaining of SOB with onset last night. She reports associated cough and wheezing. She states SOB is worse with walking and when laying down. She states she used an inhaler and inhaler without significant improvement. She denies history of COPDs or ever smoking. She has history of MI. She denies back pain, chest pain, fever, or swelling anywhere.   Past Medical History  Diagnosis Date  . Nonischemic cardiomyopathy     LVEF 20-25%  . Asthma   . Cerebrovascular disease     TIA and stroke   . Essential hypertension, benign   . Coronary atherosclerosis of native coronary artery     Nonobstructive at cath 2005  . Hypothyroidism   . Carotid artery disease   . Gout   . Renal artery stenosis     Bilateral  . Bronchitis    Past Surgical History  Procedure Laterality Date  . Abdominal hysterectomy     Family History  Problem Relation Age of Onset  . Asthma Son    History  Substance Use Topics  . Smoking status: Never Smoker   . Smokeless tobacco: Never Used  . Alcohol Use: No   OB History    No data available     Review of Systems  Constitutional: Negative for fever.  Respiratory: Positive for cough, shortness of breath and wheezing.   Cardiovascular: Negative for chest pain and leg swelling.  Musculoskeletal: Negative for back pain.      Allergies  Review of patient's allergies indicates no known allergies.  Home Medications   Prior to Admission medications   Medication  Sig Start Date End Date Taking? Authorizing Provider  albuterol (PROVENTIL HFA;VENTOLIN HFA) 108 (90 BASE) MCG/ACT inhaler Inhale 2 puffs into the lungs every 4 (four) hours as needed for wheezing or shortness of breath.    Historical Provider, MD  bisoprolol (ZEBETA) 5 MG tablet Take 5 mg by mouth every morning.    Historical Provider, MD  furosemide (LASIX) 20 MG tablet Take 20 mg by mouth every morning.    Historical Provider, MD  HYDROcodone-homatropine (HYDROMET) 5-1.5 MG/5ML syrup Take 5 mLs by mouth every 4 (four) hours as needed for cough.    Historical Provider, MD  metFORMIN (GLUCOPHAGE) 500 MG tablet Take by mouth 2 (two) times daily.    Historical Provider, MD  potassium chloride SA (K-DUR,KLOR-CON) 20 MEQ tablet Take 1 tablet (20 mEq total) by mouth daily. 06/11/14   Angus McInnis, MD   BP 158/110 mmHg  Pulse 102  Temp(Src) 98.2 F (36.8 C) (Oral)  Resp 35  Ht 5\' 5"  (1.651 m)  Wt 160 lb (72.576 kg)  BMI 26.63 kg/m2  SpO2 88% Physical Exam  Constitutional: She appears well-developed and well-nourished. No distress.  HENT:  Head: Normocephalic and atraumatic.  Eyes: Conjunctivae are normal. Right eye exhibits no discharge. Left eye exhibits no discharge.  Neck: Neck supple.  Cardiovascular: Regular rhythm and normal heart sounds.  Tachycardia present.  Exam reveals no gallop and no friction rub.   No murmur heard. Pulmonary/Chest: Effort normal. No respiratory distress. She has rales (at bilateral bases).  Abdominal: Soft. She exhibits no distension. There is no tenderness.  Musculoskeletal: She exhibits no edema or tenderness.  Neurological: She is alert.  Skin: Skin is warm and dry.  Psychiatric: She has a normal mood and affect. Her behavior is normal. Thought content normal.  Nursing note and vitals reviewed.   ED Course  Procedures (including critical care time)  DIAGNOSTIC STUDIES: Oxygen Saturation is 95% on nasal canula, adequate by my interpretation.     COORDINATION OF CARE: 10:55 AM Discussed treatment plan with patient at beside, the patient agrees with the plan and has no further questions at this time.  12:58 PM Patient sitting up on side of bed and appears comfortable. She agrees with plan to discharge.  Labs Review Labs Reviewed  BASIC METABOLIC PANEL - Abnormal; Notable for the following:    Chloride 114 (*)    Glucose, Bld 141 (*)    BUN 24 (*)    GFR calc non Af Amer 50 (*)    GFR calc Af Amer 58 (*)    All other components within normal limits  BRAIN NATRIURETIC PEPTIDE - Abnormal; Notable for the following:    B Natriuretic Peptide 1144.0 (*)    All other components within normal limits  CBC WITH DIFFERENTIAL/PLATELET  TROPONIN I    Imaging Review No results found.   Dg Chest 2 View  03/09/2015   CLINICAL DATA:  Shortness of breath, cough since last night. Chronic bronchitis.  EXAM: CHEST  2 VIEW  COMPARISON:  11/10/2014  FINDINGS: Bilateral diffuse interstitial thickening and peribronchial cuffing most concerning for acute on chronic bronchitis versus mild interstitial edema. There is no focal parenchymal opacity, pleural effusion, or pneumothorax. There is stable cardiomegaly.  The osseous structures are unremarkable.  IMPRESSION: Bilateral diffuse interstitial thickening and peribronchial cuffing most concerning for acute on chronic bronchitis versus mild interstitial edema.   Electronically Signed   By: Kathreen Devoid   On: 03/09/2015 11:52    EKG Interpretation   Date/Time:  Monday March 09 2015 10:47:03 EDT Ventricular Rate:  102 PR Interval:  170 QRS Duration: 144 QT Interval:  408 QTC Calculation: 531 R Axis:   90 Text Interpretation:  Sinus tachycardia Right bundle branch block Baseline  wander in lead(s) V3 ED PHYSICIAN INTERPRETATION AVAILABLE IN [facility] Confirmed by TEST, Record ([id number]) on 03/11/2015 7:25:04 AM      MDM   Final diagnoses:  Dyspnea  Acute on chronic heart failure,  unspecified heart failure type    78yF with dyspnea. Now feels significantly better. Will increased lasix for a couple days. I feel appropriate for DC at this time. It has been determined that no acute conditions requiring further emergency intervention are present at this time. The patient has been advised of the diagnosis and plan. I reviewed any labs and imaging including any potential incidental findings. We have discussed signs and symptoms that warrant return to the ED and they are listed in the discharge instructions.   I personally preformed the services scribed in my presence. The recorded information has been reviewed is accurate. Virgel Manifold, MD.   Virgel Manifold, MD 03/12/15 [phone]

## 2015-03-09 NOTE — ED Notes (Signed)
Pt made 2 trips to bathroom without notifying nurse. Unable to get output amount.

## 2015-03-09 NOTE — ED Notes (Signed)
Pt alert & oriented x4, stable gait. Patient given discharge instructions, paperwork & prescription(s). Patient  instructed to stop at the registration desk to finish any additional paperwork. Patient verbalized understanding. Pt left department w/ no further questions. 

## 2015-03-09 NOTE — Discharge Instructions (Signed)
Increase your lasix/furosemide (water pill) to 20 mg TWICE A DAY for the next THREE DAYS and then resume taking 20mg  daily.    Heart Failure Heart failure is a condition in which the heart has trouble pumping blood. This means your heart does not pump blood efficiently for your body to work well. In some cases of heart failure, fluid may back up into your lungs or you may have swelling (edema) in your lower legs. Heart failure is usually a long-term (chronic) condition. It is important for you to take good care of yourself and follow your health care provider's treatment plan. CAUSES  Some health conditions can cause heart failure. Those health conditions include:  High blood pressure (hypertension). Hypertension causes the heart muscle to work harder than normal. When pressure in the blood vessels is high, the heart needs to pump (contract) with more force in order to circulate blood throughout the body. High blood pressure eventually causes the heart to become stiff and weak.  Coronary artery disease (CAD). CAD is the buildup of cholesterol and fat (plaque) in the arteries of the heart. The blockage in the arteries deprives the heart muscle of oxygen and blood. This can cause chest pain and may lead to a heart attack. High blood pressure can also contribute to CAD.  Heart attack (myocardial infarction). A heart attack occurs when one or more arteries in the heart become blocked. The loss of oxygen damages the muscle tissue of the heart. When this happens, part of the heart muscle dies. The injured tissue does not contract as well and weakens the heart's ability to pump blood.  Abnormal heart valves. When the heart valves do not open and close properly, it can cause heart failure. This makes the heart muscle pump harder to keep the blood flowing.  Heart muscle disease (cardiomyopathy or myocarditis). Heart muscle disease is damage to the heart muscle from a variety of causes. These can include drug  or alcohol abuse, infections, or unknown reasons. These can increase the risk of heart failure.  Lung disease. Lung disease makes the heart work harder because the lungs do not work properly. This can cause a strain on the heart, leading it to fail.  Diabetes. Diabetes increases the risk of heart failure. High blood sugar contributes to high fat (lipid) levels in the blood. Diabetes can also cause slow damage to tiny blood vessels that carry important nutrients to the heart muscle. When the heart does not get enough oxygen and food, it can cause the heart to become weak and stiff. This leads to a heart that does not contract efficiently.  Other conditions can contribute to heart failure. These include abnormal heart rhythms, thyroid problems, and low blood counts (anemia). Certain unhealthy behaviors can increase the risk of heart failure, including:  Being overweight.  Smoking or chewing tobacco.  Eating foods high in fat and cholesterol.  Abusing illicit drugs or alcohol.  Lacking physical activity. SYMPTOMS  Heart failure symptoms may vary and can be hard to detect. Symptoms may include:  Shortness of breath with activity, such as climbing stairs.  Persistent cough.  Swelling of the feet, ankles, legs, or abdomen.  Unexplained weight gain.  Difficulty breathing when lying flat (orthopnea).  Waking from sleep because of the need to sit up and get more air.  Rapid heartbeat.  Fatigue and loss of energy.  Feeling light-headed, dizzy, or close to fainting.  Loss of appetite.  Nausea.  Increased urination during the night (nocturia). DIAGNOSIS  A diagnosis of heart failure is based on your history, symptoms, physical examination, and diagnostic tests. Diagnostic tests for heart failure may include:  Echocardiography.  Electrocardiography.  Chest X-ray.  Blood tests.  Exercise stress test.  Cardiac angiography.  Radionuclide scans. TREATMENT  Treatment is  aimed at managing the symptoms of heart failure. Medicines, behavioral changes, or surgical intervention may be necessary to treat heart failure.  Medicines to help treat heart failure may include:  Angiotensin-converting enzyme (ACE) inhibitors. This type of medicine blocks the effects of a blood protein called angiotensin-converting enzyme. ACE inhibitors relax (dilate) the blood vessels and help lower blood pressure.  Angiotensin receptor blockers (ARBs). This type of medicine blocks the actions of a blood protein called angiotensin. Angiotensin receptor blockers dilate the blood vessels and help lower blood pressure.  Water pills (diuretics). Diuretics cause the kidneys to remove salt and water from the blood. The extra fluid is removed through urination. This loss of extra fluid lowers the volume of blood the heart pumps.  Beta blockers. These prevent the heart from beating too fast and improve heart muscle strength.  Digitalis. This increases the force of the heartbeat.  Healthy behavior changes include:  Obtaining and maintaining a healthy weight.  Stopping smoking or chewing tobacco.  Eating heart-healthy foods.  Limiting or avoiding alcohol.  Stopping illicit drug use.  Physical activity as directed by your health care provider.  Surgical treatment for heart failure may include:  A procedure to open blocked arteries, repair damaged heart valves, or remove damaged heart muscle tissue.  A pacemaker to improve heart muscle function and control certain abnormal heart rhythms.  An internal cardioverter defibrillator to treat certain serious abnormal heart rhythms.  A left ventricular assist device (LVAD) to assist the pumping ability of the heart. HOME CARE INSTRUCTIONS   Take medicines only as directed by your health care provider. Medicines are important in reducing the workload of your heart, slowing the progression of heart failure, and improving your symptoms.  Do  not stop taking your medicine unless directed by your health care provider.  Do not skip any dose of medicine.  Refill your prescriptions before you run out of medicine. Your medicines are needed every day.  Engage in moderate physical activity if directed by your health care provider. Moderate physical activity can benefit some people. The elderly and people with severe heart failure should consult with a health care provider for physical activity recommendations.  Eat heart-healthy foods. Food choices should be free of trans fat and low in saturated fat, cholesterol, and salt (sodium). Healthy choices include fresh or frozen fruits and vegetables, fish, lean meats, legumes, fat-free or low-fat dairy products, and whole grain or high fiber foods. Talk to a dietitian to learn more about heart-healthy foods.  Limit sodium if directed by your health care provider. Sodium restriction may reduce symptoms of heart failure in some people. Talk to a dietitian to learn more about heart-healthy seasonings.  Use healthy cooking methods. Healthy cooking methods include roasting, grilling, broiling, baking, poaching, steaming, or stir-frying. Talk to a dietitian to learn more about healthy cooking methods.  Limit fluids if directed by your health care provider. Fluid restriction may reduce symptoms of heart failure in some people.  Weigh yourself every day. Daily weights are important in the early recognition of excess fluid. You should weigh yourself every morning after you urinate and before you eat breakfast. Wear the same amount of clothing each time you weigh yourself. Record your  daily weight. Provide your health care provider with your weight record.  Monitor and record your blood pressure if directed by your health care provider.  Check your pulse if directed by your health care provider.  Lose weight if directed by your health care provider. Weight loss may reduce symptoms of heart failure in some  people.  Stop smoking or chewing tobacco. Nicotine makes your heart work harder by causing your blood vessels to constrict. Do not use nicotine gum or patches before talking to your health care provider.  Keep all follow-up visits as directed by your health care provider. This is important.  Limit alcohol intake to no more than 1 drink per day for nonpregnant women and 2 drinks per day for men. One drink equals 12 ounces of beer, 5 ounces of wine, or 1 ounces of hard liquor. Drinking more than that is harmful to your heart. Tell your health care provider if you drink alcohol several times a week. Talk with your health care provider about whether alcohol is safe for you. If your heart has already been damaged by alcohol or you have severe heart failure, drinking alcohol should be stopped completely.  Stop illicit drug use.  Stay up-to-date with immunizations. It is especially important to prevent respiratory infections through current pneumococcal and influenza immunizations.  Manage other health conditions such as hypertension, diabetes, thyroid disease, or abnormal heart rhythms as directed by your health care provider.  Learn to manage stress.  Plan rest periods when fatigued.  Learn strategies to manage high temperatures. If the weather is extremely hot:  Avoid vigorous physical activity.  Use air conditioning or fans or seek a cooler location.  Avoid caffeine and alcohol.  Wear loose-fitting, lightweight, and light-colored clothing.  Learn strategies to manage cold temperatures. If the weather is extremely cold:  Avoid vigorous physical activity.  Layer clothes.  Wear mittens or gloves, a hat, and a scarf when going outside.  Avoid alcohol.  Obtain ongoing education and support as needed.  Participate in or seek rehabilitation as needed to maintain or improve independence and quality of life. SEEK MEDICAL CARE IF:   Your weight increases by 03 lb/1.4 kg in 1 day or 05  lb/2.3 kg in a week.  You have increasing shortness of breath that is unusual for you.  You are unable to participate in your usual physical activities.  You tire easily.  You cough more than normal, especially with physical activity.  You have any or more swelling in areas such as your hands, feet, ankles, or abdomen.  You are unable to sleep because it is hard to breathe.  You feel like your heart is beating fast (palpitations).  You become dizzy or light-headed upon standing up. SEEK IMMEDIATE MEDICAL CARE IF:   You have difficulty breathing.  There is a change in mental status such as decreased alertness or difficulty with concentration.  You have a pain or discomfort in your chest.  You have an episode of fainting (syncope). MAKE SURE YOU:   Understand these instructions.  Will watch your condition.  Will get help right away if you are not doing well or get worse. Document Released: 11/28/2005 Document Revised: 04/14/2014 Document Reviewed: 12/28/2012 [facility] Patient Information 2015 [place], [place]. This information is not intended to replace advice given to you by your health care provider. Make sure you discuss any questions you have with your health care provider.

## 2015-03-09 NOTE — ED Notes (Signed)
Pt states SOB started last night, denies cough, no active wheezing auscultated, pt is breathing 30 times per minute.

## 2015-03-30 ENCOUNTER — Encounter (HOSPITAL_COMMUNITY): Payer: Self-pay | Admitting: Emergency Medicine

## 2015-03-30 ENCOUNTER — Emergency Department (HOSPITAL_COMMUNITY): Payer: Medicare Other

## 2015-03-30 ENCOUNTER — Emergency Department (HOSPITAL_COMMUNITY)
Admission: EM | Admit: 2015-03-30 | Discharge: 2015-03-30 | Disposition: A | Discharge status: Home / Self Care | Payer: Medicare Other | Attending: Emergency Medicine | Admitting: Emergency Medicine

## 2015-03-30 DIAGNOSIS — Z8739 Personal history of other diseases of the musculoskeletal system and connective tissue: Secondary | ICD-10-CM | POA: Insufficient documentation

## 2015-03-30 DIAGNOSIS — Z79899 Other long term (current) drug therapy: Secondary | ICD-10-CM | POA: Insufficient documentation

## 2015-03-30 DIAGNOSIS — I10 Essential (primary) hypertension: Secondary | ICD-10-CM | POA: Insufficient documentation

## 2015-03-30 DIAGNOSIS — J45909 Unspecified asthma, uncomplicated: Secondary | ICD-10-CM | POA: Insufficient documentation

## 2015-03-30 DIAGNOSIS — R05 Cough: Secondary | ICD-10-CM | POA: Diagnosis present

## 2015-03-30 DIAGNOSIS — Z8673 Personal history of transient ischemic attack (TIA), and cerebral infarction without residual deficits: Secondary | ICD-10-CM | POA: Insufficient documentation

## 2015-03-30 DIAGNOSIS — J159 Unspecified bacterial pneumonia: Secondary | ICD-10-CM | POA: Diagnosis not present

## 2015-03-30 DIAGNOSIS — I2510 Atherosclerotic heart disease of native coronary artery without angina pectoris: Secondary | ICD-10-CM | POA: Insufficient documentation

## 2015-03-30 DIAGNOSIS — Z87448 Personal history of other diseases of urinary system: Secondary | ICD-10-CM | POA: Insufficient documentation

## 2015-03-30 DIAGNOSIS — J189 Pneumonia, unspecified organism: Secondary | ICD-10-CM

## 2015-03-30 LAB — CBC WITH DIFFERENTIAL/PLATELET
Basophils Absolute: 0 10*3/uL (ref 0.0–0.1)
Basophils Relative: 0 % (ref 0–1)
Eosinophils Absolute: 0.1 10*3/uL (ref 0.0–0.7)
Eosinophils Relative: 1 % (ref 0–5)
HCT: 40 % (ref 36.0–46.0)
Hemoglobin: 12.5 g/dL (ref 12.0–15.0)
Lymphocytes Relative: 29 % (ref 12–46)
Lymphs Abs: 1.9 10*3/uL (ref 0.7–4.0)
MCH: 26.8 pg (ref 26.0–34.0)
MCHC: 31.3 g/dL (ref 30.0–36.0)
MCV: 85.7 fL (ref 78.0–100.0)
Monocytes Absolute: 0.5 10*3/uL (ref 0.1–1.0)
Monocytes Relative: 7 % (ref 3–12)
Neutro Abs: 4.1 10*3/uL (ref 1.7–7.7)
Neutrophils Relative %: 63 % (ref 43–77)
Platelets: 229 10*3/uL (ref 150–400)
RBC: 4.67 MIL/uL (ref 3.87–5.11)
RDW: 14.7 % (ref 11.5–15.5)
WBC: 6.6 10*3/uL (ref 4.0–10.5)

## 2015-03-30 LAB — COMPREHENSIVE METABOLIC PANEL
ALT: 29 U/L (ref 0–35)
AST: 23 U/L (ref 0–37)
Albumin: 3.4 g/dL — ABNORMAL LOW (ref 3.5–5.2)
Alkaline Phosphatase: 195 U/L — ABNORMAL HIGH (ref 39–117)
Anion gap: 8 (ref 5–15)
BUN: 18 mg/dL (ref 6–23)
CO2: 25 mmol/L (ref 19–32)
Calcium: 9.1 mg/dL (ref 8.4–10.5)
Chloride: 110 mmol/L (ref 96–112)
Creatinine, Ser: 1.06 mg/dL (ref 0.50–1.10)
GFR calc Af Amer: 58 mL/min — ABNORMAL LOW (ref >90)
GFR calc non Af Amer: 50 mL/min — ABNORMAL LOW (ref >90)
Glucose, Bld: 138 mg/dL — ABNORMAL HIGH (ref 70–99)
Potassium: 3.3 mmol/L — ABNORMAL LOW (ref 3.5–5.1)
Sodium: 143 mmol/L (ref 135–145)
Total Bilirubin: 0.8 mg/dL (ref 0.3–1.2)
Total Protein: 6.9 g/dL (ref 6.0–8.3)

## 2015-03-30 LAB — BRAIN NATRIURETIC PEPTIDE: B Natriuretic Peptide: 1841 pg/mL — ABNORMAL HIGH (ref 0.0–100.0)

## 2015-03-30 MED ORDER — LEVOFLOXACIN IN D5W 500 MG/100ML IV SOLN: 500.0000 mg | Freq: Once | INTRAVENOUS | Status: DC

## 2015-03-30 MED ORDER — LEVOFLOXACIN 500 MG PO TABS
500.0000 mg | ORAL_TABLET | Freq: Once | ORAL | Status: AC
  Administered 2015-03-30: 500 mg via ORAL
  Filled 2015-03-30: qty 1

## 2015-03-30 MED ORDER — LEVOFLOXACIN 500 MG PO TABS: 500.0000 mg | ORAL_TABLET | Freq: Every day | ORAL | Status: DC

## 2015-03-30 MED ORDER — FUROSEMIDE 40 MG PO TABS
20.0000 mg | ORAL_TABLET | Freq: Once | ORAL | Status: AC
  Administered 2015-03-30: 20 mg via ORAL
  Filled 2015-03-30: qty 1

## 2015-03-30 MED ORDER — BISOPROLOL FUMARATE 5 MG PO TABS
5.0000 mg | ORAL_TABLET | Freq: Once | ORAL | Status: AC
  Administered 2015-03-30: 5 mg via ORAL
  Filled 2015-03-30: qty 1

## 2015-03-30 MED ORDER — POTASSIUM CHLORIDE CRYS ER 20 MEQ PO TBCR
40.0000 meq | EXTENDED_RELEASE_TABLET | Freq: Once | ORAL | Status: AC
  Administered 2015-03-30: 40 meq via ORAL
  Filled 2015-03-30: qty 2

## 2015-03-30 NOTE — ED Provider Notes (Signed)
CSN: [id number]     Arrival date & time 03/30/15  1847 History   First MD Initiated Contact with Patient 03/30/15 1915     Chief Complaint  Patient presents with  . Coughing up blood      (Consider location/radiation/quality/duration/timing/severity/associated sxs/prior Treatment) Patient is a 79 y.o. female presenting with cough. The history is provided by the patient (the pt states she has been coughing up blood.  also she did not take her bp medicine today).  Cough Cough characteristics:  Productive Sputum characteristics:  Bloody Severity:  Mild Onset quality:  Sudden Timing:  Constant Progression:  Unchanged Chronicity:  New Associated symptoms: no chest pain, no eye discharge, no headaches and no rash     Past Medical History  Diagnosis Date  . Nonischemic cardiomyopathy     LVEF 20-25%  . Asthma   . Cerebrovascular disease     TIA and stroke   . Essential hypertension, benign   . Coronary atherosclerosis of native coronary artery     Nonobstructive at cath 2005  . Hypothyroidism   . Carotid artery disease   . Gout   . Renal artery stenosis     Bilateral  . Bronchitis    Past Surgical History  Procedure Laterality Date  . Abdominal hysterectomy     Family History  Problem Relation Age of Onset  . Asthma Son    History  Substance Use Topics  . Smoking status: Never Smoker   . Smokeless tobacco: Never Used  . Alcohol Use: No   OB History    Gravida Para Term Preterm AB TAB SAB Ectopic Multiple Living   10 10 10             Review of Systems  Constitutional: Negative for appetite change and fatigue.  HENT: Negative for congestion, ear discharge and sinus pressure.   Eyes: Negative for discharge.  Respiratory: Positive for cough.   Cardiovascular: Negative for chest pain.  Gastrointestinal: Negative for abdominal pain and diarrhea.  Genitourinary: Negative for frequency and hematuria.  Musculoskeletal: Negative for back pain.  Skin: Negative for  rash.  Neurological: Negative for seizures and headaches.  Psychiatric/Behavioral: Negative for hallucinations.      Allergies  Review of patient's allergies indicates no known allergies.  Home Medications   Prior to Admission medications   Medication Sig Start Date End Date Taking? Authorizing Provider  bisoprolol (ZEBETA) 5 MG tablet Take 5 mg by mouth every morning.   Yes Historical Provider, MD  furosemide (LASIX) 20 MG tablet Take 20 mg by mouth every morning.   Yes Historical Provider, MD  metFORMIN (GLUCOPHAGE) 500 MG tablet Take by mouth 2 (two) times daily.   Yes Historical Provider, MD  methylPREDNISolone (MEDROL DOSEPAK) 4 MG TBPK tablet Take 4-24 mg by mouth See admin instructions. TAKE 6 TABLETS BY MOUTH ON DAY 1, THEN TAKE 5 TABLETS BY MOUTH ON DAY 2, THEN TAKEN 4 TABLETS BY MOUTH ON DAY 3, THEN TAKE 3 TABLETS ON DAY 4, THEN TAKE 2 TABLETS ON DAY 5, THEN TAKE 1 TABLET ON DAY 6. STOP 03/20/15  Yes Historical Provider, MD  albuterol (PROVENTIL HFA;VENTOLIN HFA) 108 (90 BASE) MCG/ACT inhaler Inhale 2 puffs into the lungs every 4 (four) hours as needed for wheezing or shortness of breath.    Historical Provider, MD  levofloxacin (LEVAQUIN) 500 MG tablet Take 1 tablet (500 mg total) by mouth daily. 03/30/15   Milton Ferguson, MD  potassium chloride SA (K-DUR,KLOR-CON) 20 MEQ tablet Take  1 tablet (20 mEq total) by mouth daily. Patient not taking: Reported on 03/30/2015 06/11/14   Marjean Donna, MD  predniSONE (DELTASONE) 20 MG tablet Take 2 tablets (40 mg total) by mouth daily. Patient not taking: Reported on 03/30/2015 03/09/15   Virgel Manifold, MD   BP 179/104 mmHg  Pulse 102  Temp(Src) 99 F (37.2 C) (Oral)  Resp 20  Ht 5\' 2"  (1.575 m)  Wt 160 lb (72.576 kg)  BMI 29.26 kg/m2  SpO2 98% Physical Exam  Constitutional: She is oriented to person, place, and time. She appears well-developed.  HENT:  Head: Normocephalic.  Eyes: Conjunctivae and EOM are normal. No scleral icterus.   Neck: Neck supple. No thyromegaly present.  Cardiovascular: Normal rate and regular rhythm.  Exam reveals no gallop and no friction rub.   No murmur heard. Pulmonary/Chest: No stridor. She has no wheezes. She has no rales. She exhibits no tenderness.  Abdominal: She exhibits no distension. There is no tenderness. There is no rebound.  Musculoskeletal: Normal range of motion. She exhibits no edema.  Lymphadenopathy:    She has no cervical adenopathy.  Neurological: She is oriented to person, place, and time. She exhibits normal muscle tone. Coordination normal.  Skin: No rash noted. No erythema.  Psychiatric: She has a normal mood and affect. Her behavior is normal.    ED Course  Procedures (including critical care time) Labs Review Labs Reviewed  COMPREHENSIVE METABOLIC PANEL - Abnormal; Notable for the following:    Potassium 3.3 (*)    Glucose, Bld 138 (*)    Albumin 3.4 (*)    Alkaline Phosphatase 195 (*)    GFR calc non Af Amer 50 (*)    GFR calc Af Amer 58 (*)    All other components within normal limits  BRAIN NATRIURETIC PEPTIDE - Abnormal; Notable for the following:    B Natriuretic Peptide 1841.0 (*)    All other components within normal limits  CBC WITH DIFFERENTIAL/PLATELET    Imaging Review Dg Chest 2 View  03/30/2015   CLINICAL DATA:  Hemoptysis.  EXAM: CHEST  2 VIEW  COMPARISON:  March 09, 2015.  FINDINGS: Stable cardiomegaly. No pneumothorax or pleural effusion is noted. Slightly increased diffuse interstitial densities are noted throughout both lungs concerning for worsening edema. There is also noted increased airspace opacity in the right middle lobe which may represent superimposed pneumonia. Bony thorax is intact.  IMPRESSION: Slightly increased diffuse interstitial lung opacities are noted bilaterally concerning for edema. New right middle lobe airspace opacity is noted concerning for pneumonia.   Electronically Signed   By: Marijo Conception, M.D.   On:  03/30/2015 20:26     EKG Interpretation None      MDM   Final diagnoses:  Community acquired pneumonia   Pneumonia,  tx with levaquin,   Mild chf,  Will increase lasix with pcp check in 2 days     Milton Ferguson, MD 03/30/15 2111

## 2015-03-30 NOTE — Discharge Instructions (Signed)
Follow up with your md in 2-3 days,   Increase your fluid pill lasix to 2 pills a day

## 2015-03-30 NOTE — ED Notes (Signed)
Patient given discharge instruction, verbalized understand. Patient ambulatory out of the department.  

## 2015-03-30 NOTE — ED Notes (Signed)
Patient ambulatory to restroom with assistance with steady gait

## 2015-03-30 NOTE — ED Notes (Signed)
Pt moved from bed 17, pt is alert, coughing, on medication from PCP, pharmacy reviewing meds.

## 2015-03-30 NOTE — ED Notes (Signed)
Pt reports she has had bronchitis, started coughing up blood yesterday.

## 2015-04-25 ENCOUNTER — Emergency Department (HOSPITAL_COMMUNITY): Payer: Medicare Other

## 2015-04-25 ENCOUNTER — Encounter (HOSPITAL_COMMUNITY): Payer: Self-pay | Admitting: Emergency Medicine

## 2015-04-25 ENCOUNTER — Inpatient Hospital Stay (HOSPITAL_COMMUNITY)
Admission: EM | Admit: 2015-04-25 | Discharge: 2015-04-30 | DRG: 193 | Disposition: A | Discharge status: Home / Self Care | Payer: Medicare Other | Attending: Family Medicine | Admitting: Family Medicine

## 2015-04-25 DIAGNOSIS — J189 Pneumonia, unspecified organism: Secondary | ICD-10-CM | POA: Diagnosis present

## 2015-04-25 DIAGNOSIS — E039 Hypothyroidism, unspecified: Secondary | ICD-10-CM | POA: Diagnosis present

## 2015-04-25 DIAGNOSIS — J45909 Unspecified asthma, uncomplicated: Secondary | ICD-10-CM | POA: Diagnosis present

## 2015-04-25 DIAGNOSIS — E1121 Type 2 diabetes mellitus with diabetic nephropathy: Secondary | ICD-10-CM

## 2015-04-25 DIAGNOSIS — N189 Chronic kidney disease, unspecified: Secondary | ICD-10-CM | POA: Diagnosis present

## 2015-04-25 DIAGNOSIS — I5042 Chronic combined systolic (congestive) and diastolic (congestive) heart failure: Secondary | ICD-10-CM | POA: Diagnosis present

## 2015-04-25 DIAGNOSIS — Z8673 Personal history of transient ischemic attack (TIA), and cerebral infarction without residual deficits: Secondary | ICD-10-CM

## 2015-04-25 DIAGNOSIS — R0602 Shortness of breath: Secondary | ICD-10-CM | POA: Diagnosis not present

## 2015-04-25 DIAGNOSIS — E119 Type 2 diabetes mellitus without complications: Secondary | ICD-10-CM

## 2015-04-25 DIAGNOSIS — Z8701 Personal history of pneumonia (recurrent): Secondary | ICD-10-CM

## 2015-04-25 DIAGNOSIS — I2510 Atherosclerotic heart disease of native coronary artery without angina pectoris: Secondary | ICD-10-CM | POA: Diagnosis present

## 2015-04-25 DIAGNOSIS — J9601 Acute respiratory failure with hypoxia: Secondary | ICD-10-CM | POA: Diagnosis present

## 2015-04-25 DIAGNOSIS — E876 Hypokalemia: Secondary | ICD-10-CM | POA: Diagnosis present

## 2015-04-25 DIAGNOSIS — J181 Lobar pneumonia, unspecified organism: Secondary | ICD-10-CM

## 2015-04-25 DIAGNOSIS — N179 Acute kidney failure, unspecified: Secondary | ICD-10-CM | POA: Diagnosis present

## 2015-04-25 DIAGNOSIS — I429 Cardiomyopathy, unspecified: Secondary | ICD-10-CM | POA: Diagnosis present

## 2015-04-25 DIAGNOSIS — J180 Bronchopneumonia, unspecified organism: Secondary | ICD-10-CM | POA: Diagnosis present

## 2015-04-25 DIAGNOSIS — J9600 Acute respiratory failure, unspecified whether with hypoxia or hypercapnia: Secondary | ICD-10-CM | POA: Diagnosis not present

## 2015-04-25 DIAGNOSIS — I129 Hypertensive chronic kidney disease with stage 1 through stage 4 chronic kidney disease, or unspecified chronic kidney disease: Secondary | ICD-10-CM | POA: Diagnosis present

## 2015-04-25 DIAGNOSIS — I10 Essential (primary) hypertension: Secondary | ICD-10-CM | POA: Diagnosis present

## 2015-04-25 LAB — COMPREHENSIVE METABOLIC PANEL
ALT: 10 U/L — ABNORMAL LOW (ref 14–54)
AST: 18 U/L (ref 15–41)
Albumin: 3.8 g/dL (ref 3.5–5.0)
Alkaline Phosphatase: 134 U/L — ABNORMAL HIGH (ref 38–126)
Anion gap: 10 (ref 5–15)
BUN: 13 mg/dL (ref 6–20)
CO2: 28 mmol/L (ref 22–32)
Calcium: 9.4 mg/dL (ref 8.9–10.3)
Chloride: 107 mmol/L (ref 101–111)
Creatinine, Ser: 1.24 mg/dL — ABNORMAL HIGH (ref 0.44–1.00)
GFR calc Af Amer: 48 mL/min — ABNORMAL LOW (ref >60)
GFR calc non Af Amer: 41 mL/min — ABNORMAL LOW (ref >60)
Glucose, Bld: 142 mg/dL — ABNORMAL HIGH (ref 65–99)
Potassium: 3.4 mmol/L — ABNORMAL LOW (ref 3.5–5.1)
Sodium: 145 mmol/L (ref 135–145)
Total Bilirubin: 0.7 mg/dL (ref 0.3–1.2)
Total Protein: 7.3 g/dL (ref 6.5–8.1)

## 2015-04-25 LAB — CBC WITH DIFFERENTIAL/PLATELET
Basophils Absolute: 0 10*3/uL (ref 0.0–0.1)
Basophils Relative: 1 % (ref 0–1)
Eosinophils Absolute: 0.1 10*3/uL (ref 0.0–0.7)
Eosinophils Relative: 1 % (ref 0–5)
HCT: 45.5 % (ref 36.0–46.0)
Hemoglobin: 14.4 g/dL (ref 12.0–15.0)
Lymphocytes Relative: 23 % (ref 12–46)
Lymphs Abs: 1.4 10*3/uL (ref 0.7–4.0)
MCH: 26.7 pg (ref 26.0–34.0)
MCHC: 31.6 g/dL (ref 30.0–36.0)
MCV: 84.4 fL (ref 78.0–100.0)
Monocytes Absolute: 0.5 10*3/uL (ref 0.1–1.0)
Monocytes Relative: 8 % (ref 3–12)
Neutro Abs: 4.1 10*3/uL (ref 1.7–7.7)
Neutrophils Relative %: 67 % (ref 43–77)
Platelets: 157 10*3/uL (ref 150–400)
RBC: 5.39 MIL/uL — ABNORMAL HIGH (ref 3.87–5.11)
RDW: 14 % (ref 11.5–15.5)
WBC: 6 10*3/uL (ref 4.0–10.5)

## 2015-04-25 LAB — LACTIC ACID, PLASMA: Lactic Acid, Venous: 1.8 mmol/L (ref 0.5–2.0)

## 2015-04-25 LAB — BRAIN NATRIURETIC PEPTIDE: B Natriuretic Peptide: 1091 pg/mL — ABNORMAL HIGH (ref 0.0–100.0)

## 2015-04-25 LAB — TROPONIN I: Troponin I: 0.03 ng/mL (ref <0.031)

## 2015-04-25 MED ORDER — DEXTROSE 5 % IV SOLN
500.0000 mg | Freq: Once | INTRAVENOUS | Status: AC
  Administered 2015-04-25: 500 mg via INTRAVENOUS
  Filled 2015-04-25: qty 500

## 2015-04-25 MED ORDER — ALBUTEROL SULFATE (2.5 MG/3ML) 0.083% IN NEBU
2.5000 mg | INHALATION_SOLUTION | Freq: Once | RESPIRATORY_TRACT | Status: AC
  Administered 2015-04-25: 2.5 mg via RESPIRATORY_TRACT
  Filled 2015-04-25: qty 3

## 2015-04-25 MED ORDER — IPRATROPIUM-ALBUTEROL 0.5-2.5 (3) MG/3ML IN SOLN
3.0000 mL | Freq: Once | RESPIRATORY_TRACT | Status: AC
  Administered 2015-04-25: 3 mL via RESPIRATORY_TRACT
  Filled 2015-04-25: qty 3

## 2015-04-25 MED ORDER — NICARDIPINE HCL IN NACL 20-0.86 MG/200ML-% IV SOLN
5.0000 mg/h | Freq: Once | INTRAVENOUS | Status: AC
Start: 2015-04-25 — End: 2015-04-25
  Administered 2015-04-25: 5 mg/h via INTRAVENOUS
  Filled 2015-04-25: qty 200

## 2015-04-25 MED ORDER — DEXTROSE 5 % IV SOLN
1.0000 g | Freq: Once | INTRAVENOUS | Status: AC
  Administered 2015-04-25: 1 g via INTRAVENOUS
  Filled 2015-04-25: qty 10

## 2015-04-25 MED ORDER — ACETAMINOPHEN 325 MG PO TABS
650.0000 mg | ORAL_TABLET | Freq: Once | ORAL | Status: AC
  Administered 2015-04-25: 650 mg via ORAL
  Filled 2015-04-25: qty 2

## 2015-04-25 MED ORDER — SODIUM CHLORIDE 0.9 % IV BOLUS (SEPSIS)
500.0000 mL | Freq: Once | INTRAVENOUS | Status: AC
  Administered 2015-04-25: 500 mL via INTRAVENOUS

## 2015-04-25 MED ORDER — NICARDIPINE HCL IN NACL 20-0.86 MG/200ML-% IV SOLN: 3.0000 mg/h | Freq: Once | INTRAVENOUS | Status: DC

## 2015-04-25 NOTE — H&P (Signed)
History and Physical  [name] TDS:[id number] DOB: November 21, 1936 DOA: 04/25/2015  Referring physician: Evalee Jefferson, PA-C, ED provider PCP: Lanette Hampshire, MD   Chief Complaint: Shortness of breath  HPI: [name] is a 79 y.o. female  The history of asthma, nonischemic cardiomyopathy with an LVEF of 30-35% on last evaluation 06/07/2014 with grade 3 diastolic dysfunction, moderate to severe mitral valve regurgitation. She also has hypertension, cerebrovascular disease status post TIA and stroke. She also has a history of hypothyroidism, but is currently untreated. Finally, she has type 2 diabetes and is currently on metformin. She presents to the hospital with 2 days of worsening dyspnea, cough, fever. She is dyspneic to proximal only 20 feet. Her dyspnea worse on exertion and improved with rest. She's been taking her inhaler without dramatic improvement. She was recently diagnosed with a pneumonia last month and was treated with Levaquin and prednisone in the outpatient setting. She had good resolution until 2 days ago. Her cough is productive with clear sputum.  Upon arrival to the emergency department, her blood pressure was 190/141. She was started on a nicardipine drip, which was gradually weaned. She is currently off the drip. Additionally, her oxygenation desaturates to less than 90 when ambulating.   Review of Systems:   Pt denies any chills, nausea, vomiting, diarrhea, constipation, abdominal pain, chest pain, palpitations,.  Review of systems are otherwise negative  Past Medical History  Diagnosis Date  . Nonischemic cardiomyopathy     LVEF 20-25%  . Asthma   . Cerebrovascular disease     TIA and stroke   . Essential hypertension, benign   . Coronary atherosclerosis of native coronary artery     Nonobstructive at cath 2005  . Hypothyroidism   . Carotid artery disease   . Gout   . Renal artery stenosis     Bilateral  . Bronchitis    Past Surgical History    Procedure Laterality Date  . Abdominal hysterectomy     Social History:  reports that she has never smoked. She has never used smokeless tobacco. She reports that she does not drink alcohol or use illicit drugs. Patient lives at home & is able to participate in activities of daily living  No Known Allergies  Family History  Problem Relation Age of Onset  . Asthma Son      Prior to Admission medications   Medication Sig Start Date End Date Taking? Authorizing Provider  albuterol (PROVENTIL HFA;VENTOLIN HFA) 108 (90 BASE) MCG/ACT inhaler Inhale 2 puffs into the lungs every 4 (four) hours as needed for wheezing or shortness of breath.   Yes Historical Provider, MD  bisoprolol (ZEBETA) 5 MG tablet Take 5 mg by mouth every morning.   Yes Historical Provider, MD  furosemide (LASIX) 20 MG tablet Take 20 mg by mouth every morning.   Yes Historical Provider, MD  metFORMIN (GLUCOPHAGE) 500 MG tablet Take by mouth 2 (two) times daily.   Yes Historical Provider, MD  potassium chloride SA (K-DUR,KLOR-CON) 20 MEQ tablet Take 1 tablet (20 mEq total) by mouth daily. 06/11/14  Yes Angus McInnis, MD  levofloxacin (LEVAQUIN) 500 MG tablet Take 1 tablet (500 mg total) by mouth daily. Patient not taking: Reported on 04/25/2015 03/30/15   Milton Ferguson, MD  predniSONE (DELTASONE) 20 MG tablet Take 2 tablets (40 mg total) by mouth daily. Patient not taking: Reported on 03/30/2015 03/09/15   Virgel Manifold, MD    Physical Exam: BP 131/69 mmHg  Pulse 105  Temp(Src) 100.8 F (38.2 C) (Oral)  Resp 34  Ht 5\' 3"  (1.6 m)  Wt 160 lb (72.576 kg)  BMI 28.35 kg/m2  SpO2 96%  General: Elderly black female. Awake and alert and oriented x3. No acute cardiopulmonary distress.  Eyes: Pupils equal, round, reactive to light. Extraocular muscles are intact. Sclerae anicteric and noninjected.  ENT:  Moist mucosal membranes. No mucosal lesions.   Neck: Neck supple without lymphadenopathy. No carotid bruits. No masses  palpated.  Cardiovascular: Regular rate with normal S1-S2 sounds. No murmurs, rubs, gallops auscultated. No JVD.  Respiratory: Prolonged exhalation, decreased breath sounds. Rales in the bases bilaterally.  Abdomen: Soft, nontender, nondistended. Active bowel sounds. No masses or hepatosplenomegaly  Skin: Dry, warm to touch. 2+ dorsalis pedis and radial pulses. Musculoskeletal: No calf or leg pain. All major joints not erythematous nontender.  Psychiatric: Intact judgment and insight.  Neurologic: No focal neurological deficits. Cranial nerves II through XII are grossly intact.           Labs on Admission:  Basic Metabolic Panel:  Recent Labs Lab 04/25/15 1841  NA 145  K 3.4*  CL 107  CO2 28  GLUCOSE 142*  BUN 13  CREATININE 1.24*  CALCIUM 9.4   Liver Function Tests:  Recent Labs Lab 04/25/15 1841  AST 18  ALT 10*  ALKPHOS 134*  BILITOT 0.7  PROT 7.3  ALBUMIN 3.8   No results for input(s): LIPASE, AMYLASE in the last 168 hours. No results for input(s): AMMONIA in the last 168 hours. CBC:  Recent Labs Lab 04/25/15 1841  WBC 6.0  NEUTROABS 4.1  HGB 14.4  HCT 45.5  MCV 84.4  PLT 157   Cardiac Enzymes:  Recent Labs Lab 04/25/15 1841  TROPONINI 0.03    BNP (last 3 results)  Recent Labs  03/09/15 1124 03/30/15 1941 04/25/15 1841  BNP 1144.0* 1841.0* 1091.0*    ProBNP (last 3 results)  Recent Labs  06/06/14 1215 07/08/14 1920  PROBNP 2651.0* 3524.0*    CBG: No results for input(s): GLUCAP in the last 168 hours.  Radiological Exams on Admission: Dg Chest Portable 1 View  04/25/2015   CLINICAL DATA:  Thick productive cough.  EXAM: PORTABLE CHEST - 1 VIEW  COMPARISON:  Radiograph 418 1,016  FINDINGS: Stable enlarged cardiac silhouette. There is bibasilar airspace disease greater on the right. No pneumothorax.  IMPRESSION: Bibasilar airspace disease concerning for multifocal pneumonia.   Electronically Signed   By: Suzy Bouchard M.D.   On:  04/25/2015 19:54    EKG: Independently reviewed. Sinus tachycardia with right bundle branch block. No ST changes  Assessment/Plan Present on Admission:  . CAP (community acquired pneumonia) . Acute respiratory failure with hypoxia . Acute renal injury  This patient was discussed with the ED physician, including pertinent vitals, physical exam findings, labs, and imaging.  We also discussed care given by the ED provider.  #1 acute respiratory failure with hypoxia #2 community-acquired pneumonia #3 acute renal injury #4 asthma #5 diabetes type 2 #6 chronic systolic and diastolic heart failure #7 hypertension  Admit to telemetry to Dr. Everette Rank Continue ceftriaxone and azithromycin Strep antigen and urine Legionella antigen Sputum culture Blood cultures pending Light IV fluids for hydration for acute renal injury We'll be cautious not to over hydrate and decompensate the patient Sliding scale insulin CBGs for meals and at bedtime Continue home medications for hypertension Repeat CBC and metabolic panel in the morning  DVT prophylaxis: Lovenox  Consultants: None  Code  Status: Full code  Family Communication: Grandson in the room   Disposition Plan: Home following improvement   Truett Mainland, DO [facility] Pager [phone]

## 2015-04-25 NOTE — ED Provider Notes (Signed)
CSN: [id number]     Arrival date & time 04/25/15  1819 History   First MD Initiated Contact with Patient 04/25/15 1834     Chief Complaint  Patient presents with  . Cough  . Shortness of Breath     (Consider location/radiation/quality/duration/timing/severity/associated sxs/prior Treatment) The history is provided by the patient.   [name] is a 79 y.o. female with a  Past medical history of asthma, cad and cardiomyopathy with EF of 20-25% and renal artery stenosis presenting with a 24 hour history of slowly worsening sob along with white sputum production, subjective fever, increasing sob worsened with exertion and when supine and wheezing which has not improved with home albuterol.  She denies chest pain and any increased peripheral edema.  She was treated for pneumonia mid last month and completed a course of levaquin.  She states she felt improved after finishing this medication.  She has not taken any of her other medications yet today.    Past Medical History  Diagnosis Date  . Nonischemic cardiomyopathy     LVEF 20-25%  . Asthma   . Cerebrovascular disease     TIA and stroke   . Essential hypertension, benign   . Coronary atherosclerosis of native coronary artery     Nonobstructive at cath 2005  . Hypothyroidism   . Carotid artery disease   . Gout   . Renal artery stenosis     Bilateral  . Bronchitis    Past Surgical History  Procedure Laterality Date  . Abdominal hysterectomy     Family History  Problem Relation Age of Onset  . Asthma Son    History  Substance Use Topics  . Smoking status: Never Smoker   . Smokeless tobacco: Never Used  . Alcohol Use: No   OB History    Gravida Para Term Preterm AB TAB SAB Ectopic Multiple Living   10 10 10             Review of Systems  Constitutional: Positive for fever and fatigue.  HENT: Negative.  Negative for congestion and sore throat.   Eyes: Negative.   Respiratory: Positive for cough, chest tightness  and shortness of breath.   Cardiovascular: Negative for chest pain and leg swelling.  Gastrointestinal: Negative for nausea, vomiting and abdominal pain.  Genitourinary: Negative.   Musculoskeletal: Negative for joint swelling, arthralgias and neck pain.  Skin: Negative.  Negative for rash and wound.  Neurological: Negative for dizziness, weakness, light-headedness, numbness and headaches.  Psychiatric/Behavioral: Negative.       Allergies  Review of patient's allergies indicates no known allergies.  Home Medications   Prior to Admission medications   Medication Sig Start Date End Date Taking? Authorizing Provider  albuterol (PROVENTIL HFA;VENTOLIN HFA) 108 (90 BASE) MCG/ACT inhaler Inhale 2 puffs into the lungs every 4 (four) hours as needed for wheezing or shortness of breath.   Yes Historical Provider, MD  bisoprolol (ZEBETA) 5 MG tablet Take 5 mg by mouth every morning.   Yes Historical Provider, MD  furosemide (LASIX) 20 MG tablet Take 20 mg by mouth every morning.   Yes Historical Provider, MD  metFORMIN (GLUCOPHAGE) 500 MG tablet Take by mouth 2 (two) times daily.   Yes Historical Provider, MD  potassium chloride SA (K-DUR,KLOR-CON) 20 MEQ tablet Take 1 tablet (20 mEq total) by mouth daily. 06/11/14  Yes Angus McInnis, MD  levofloxacin (LEVAQUIN) 500 MG tablet Take 1 tablet (500 mg total) by mouth daily. Patient not taking:  Reported on 04/25/2015 03/30/15   Milton Ferguson, MD  predniSONE (DELTASONE) 20 MG tablet Take 2 tablets (40 mg total) by mouth daily. Patient not taking: Reported on 03/30/2015 03/09/15   Virgel Manifold, MD   BP 131/69 mmHg  Pulse 105  Temp(Src) 100.8 F (38.2 C) (Oral)  Resp 34  Ht 5\' 3"  (1.6 m)  Wt 160 lb (72.576 kg)  BMI 28.35 kg/m2  SpO2 96% Physical Exam  Constitutional: She appears well-developed and well-nourished.  HENT:  Head: Normocephalic and atraumatic.  Eyes: Conjunctivae are normal.  Neck: Normal range of motion.  Cardiovascular: Regular  rhythm, normal heart sounds and intact distal pulses.  Tachycardia present.   Pulmonary/Chest: Effort normal. Tachypnea noted. She has rhonchi.  Rhonchi with expiratory wheeze right lower and mid lung fields.  Decreased breath sounds left.  Abdominal: Soft. Bowel sounds are normal. There is no tenderness.  Musculoskeletal: Normal range of motion.  Neurological: She is alert.  Skin: Skin is warm and dry.  Psychiatric: She has a normal mood and affect.  Nursing note and vitals reviewed.   ED Course  Procedures (including critical care time) Labs Review Labs Reviewed  CBC WITH DIFFERENTIAL/PLATELET - Abnormal; Notable for the following:    RBC 5.39 (*)    All other components within normal limits  COMPREHENSIVE METABOLIC PANEL - Abnormal; Notable for the following:    Potassium 3.4 (*)    Glucose, Bld 142 (*)    Creatinine, Ser 1.24 (*)    ALT 10 (*)    Alkaline Phosphatase 134 (*)    GFR calc non Af Amer 41 (*)    GFR calc Af Amer 48 (*)    All other components within normal limits  BRAIN NATRIURETIC PEPTIDE - Abnormal; Notable for the following:    B Natriuretic Peptide 1091.0 (*)    All other components within normal limits  CULTURE, BLOOD (ROUTINE X 2)  CULTURE, BLOOD (ROUTINE X 2)  TROPONIN I  LACTIC ACID, PLASMA    Imaging Review Dg Chest Portable 1 View  04/25/2015   CLINICAL DATA:  Thick productive cough.  EXAM: PORTABLE CHEST - 1 VIEW  COMPARISON:  Radiograph 418 1,016  FINDINGS: Stable enlarged cardiac silhouette. There is bibasilar airspace disease greater on the right. No pneumothorax.  IMPRESSION: Bibasilar airspace disease concerning for multifocal pneumonia.   Electronically Signed   By: Suzy Bouchard M.D.   On: 04/25/2015 19:54     EKG Interpretation   Date/Time:  Saturday Apr 25 2015 18:29:50 EDT Ventricular Rate:  146 PR Interval:  107 QRS Duration: 134 QT Interval:  374 QTC Calculation: 583 R Axis:   122 Text Interpretation:  Wide-QRS tachycardia  Right bundle branch block  Confirmed by COOK  MD, BRIAN ([id number]) on 04/25/2015 6:46:30 PM      MDM   Final diagnoses:  CAP (community acquired pneumonia)    Pt given gentle NS bolus 500cc.  CAP abx administered  - zithromax and rocephin IV.  She was started on a cardene drip given bp's here up to 243/141 - she responded appropriately to this medication.  Uses lasix and zebeta for bp control, has not had her home meds today.    Labs reviewed, bnp elevated but chronic.  She was seen by Dr. Lacinda Axon who agrees with need for admission.  Discussed with Dr. Nehemiah Settle who accepts for admission. Temp orders placed for telemetry bed.    Evalee Jefferson, PA-C 04/25/15 2156  Nat Christen, MD 04/26/15 1500

## 2015-04-25 NOTE — ED Notes (Signed)
Patient given Healthy [employer] meal per request; ok by Olga Millers, PA.

## 2015-04-25 NOTE — ED Notes (Signed)
Dr. Nehemiah Settle in to speak with patient and family regarding admission.  Cardene drip stopped due to order by Dr. Nehemiah Settle.

## 2015-04-25 NOTE — ED Notes (Signed)
Patient sitting up on side of bed with head laid on pillow and eyes closed.

## 2015-04-25 NOTE — ED Notes (Addendum)
Pt c/o thick, productive cough that started last night with shortness of breath. Denies n/v, fevers, pain. Pt has hx of asthma and bronchitis.

## 2015-04-26 ENCOUNTER — Encounter (HOSPITAL_COMMUNITY): Payer: Self-pay | Admitting: *Deleted

## 2015-04-26 LAB — BASIC METABOLIC PANEL
Anion gap: 12 (ref 5–15)
BUN: 10 mg/dL (ref 6–20)
CO2: 25 mmol/L (ref 22–32)
Calcium: 8.8 mg/dL — ABNORMAL LOW (ref 8.9–10.3)
Chloride: 105 mmol/L (ref 101–111)
Creatinine, Ser: 1.15 mg/dL — ABNORMAL HIGH (ref 0.44–1.00)
GFR calc Af Amer: 52 mL/min — ABNORMAL LOW (ref >60)
GFR calc non Af Amer: 45 mL/min — ABNORMAL LOW (ref >60)
Glucose, Bld: 181 mg/dL — ABNORMAL HIGH (ref 65–99)
Potassium: 3.5 mmol/L (ref 3.5–5.1)
Sodium: 142 mmol/L (ref 135–145)

## 2015-04-26 LAB — CBC
HCT: 39.8 % (ref 36.0–46.0)
Hemoglobin: 12.3 g/dL (ref 12.0–15.0)
MCH: 26.1 pg (ref 26.0–34.0)
MCHC: 30.9 g/dL (ref 30.0–36.0)
MCV: 84.3 fL (ref 78.0–100.0)
Platelets: 154 10*3/uL (ref 150–400)
RBC: 4.72 MIL/uL (ref 3.87–5.11)
RDW: 14 % (ref 11.5–15.5)
WBC: 6.5 10*3/uL (ref 4.0–10.5)

## 2015-04-26 LAB — GLUCOSE, CAPILLARY
Glucose-Capillary: 167 mg/dL — ABNORMAL HIGH (ref 65–99)
Glucose-Capillary: 143 mg/dL — ABNORMAL HIGH (ref 65–99)
Glucose-Capillary: 134 mg/dL — ABNORMAL HIGH (ref 65–99)
Glucose-Capillary: 139 mg/dL — ABNORMAL HIGH (ref 65–99)

## 2015-04-26 LAB — TSH: TSH: 0.958 u[IU]/mL (ref 0.350–4.500)

## 2015-04-26 LAB — STREP PNEUMONIAE URINARY ANTIGEN: Strep Pneumo Urinary Antigen: NEGATIVE

## 2015-04-26 LAB — EXPECTORATED SPUTUM ASSESSMENT W GRAM STAIN, RFLX TO RESP C

## 2015-04-26 MED ORDER — DEXTROSE 5 % IV SOLN
500.0000 mg | INTRAVENOUS | Status: DC
  Administered 2015-04-26 – 2015-04-29 (×4): 500 mg via INTRAVENOUS
  Filled 2015-04-26 (×5): qty 500

## 2015-04-26 MED ORDER — CEFTRIAXONE SODIUM IN DEXTROSE 20 MG/ML IV SOLN
1.0000 g | INTRAVENOUS | Status: DC
  Filled 2015-04-26: qty 50

## 2015-04-26 MED ORDER — SODIUM CHLORIDE 0.9 % IV SOLN
INTRAVENOUS | Status: DC
  Administered 2015-04-26: 1 mL via INTRAVENOUS
  Administered 2015-04-28: 09:00:00 via INTRAVENOUS

## 2015-04-26 MED ORDER — ACETAMINOPHEN 325 MG PO TABS
650.0000 mg | ORAL_TABLET | ORAL | Status: DC | PRN
  Administered 2015-04-26 (×2): 650 mg via ORAL
  Filled 2015-04-26 (×2): qty 2

## 2015-04-26 MED ORDER — METFORMIN HCL 500 MG PO TABS
500.0000 mg | ORAL_TABLET | Freq: Two times a day (BID) | ORAL | Status: DC
  Administered 2015-04-26 – 2015-04-30 (×9): 500 mg via ORAL
  Filled 2015-04-26 (×9): qty 1

## 2015-04-26 MED ORDER — POTASSIUM CHLORIDE CRYS ER 20 MEQ PO TBCR
20.0000 meq | EXTENDED_RELEASE_TABLET | Freq: Every day | ORAL | Status: DC
  Administered 2015-04-26 – 2015-04-30 (×5): 20 meq via ORAL
  Filled 2015-04-26 (×5): qty 1

## 2015-04-26 MED ORDER — ENOXAPARIN SODIUM 40 MG/0.4ML ~~LOC~~ SOLN
40.0000 mg | SUBCUTANEOUS | Status: DC
  Administered 2015-04-26 – 2015-04-30 (×5): 40 mg via SUBCUTANEOUS
  Filled 2015-04-26 (×5): qty 0.4

## 2015-04-26 MED ORDER — GUAIFENESIN-DM 100-10 MG/5ML PO SYRP
10.0000 mL | ORAL_SOLUTION | ORAL | Status: DC | PRN
  Administered 2015-04-26 – 2015-04-28 (×3): 10 mL via ORAL
  Filled 2015-04-26 (×3): qty 10

## 2015-04-26 MED ORDER — TRAZODONE HCL 50 MG PO TABS: 25.0000 mg | ORAL_TABLET | Freq: Every evening | ORAL | Status: DC | PRN

## 2015-04-26 MED ORDER — ALBUTEROL SULFATE (2.5 MG/3ML) 0.083% IN NEBU
3.0000 mL | INHALATION_SOLUTION | RESPIRATORY_TRACT | Status: DC | PRN
  Administered 2015-04-26 (×2): 3 mL via RESPIRATORY_TRACT
  Filled 2015-04-26: qty 3

## 2015-04-26 MED ORDER — BISOPROLOL FUMARATE 5 MG PO TABS
5.0000 mg | ORAL_TABLET | Freq: Every morning | ORAL | Status: DC
  Administered 2015-04-26 – 2015-04-30 (×5): 5 mg via ORAL
  Filled 2015-04-26 (×6): qty 1

## 2015-04-26 MED ORDER — DEXTROSE 5 % IV SOLN
1.0000 g | INTRAVENOUS | Status: DC
  Administered 2015-04-26 – 2015-04-29 (×4): 1 g via INTRAVENOUS
  Filled 2015-04-26 (×5): qty 10

## 2015-04-26 MED ORDER — INSULIN ASPART 100 UNIT/ML ~~LOC~~ SOLN
0.0000 [IU] | Freq: Three times a day (TID) | SUBCUTANEOUS | Status: DC
  Administered 2015-04-26: 2 [IU] via SUBCUTANEOUS
  Administered 2015-04-26: 3 [IU] via SUBCUTANEOUS
  Administered 2015-04-26 – 2015-04-27 (×3): 2 [IU] via SUBCUTANEOUS
  Administered 2015-04-28: 3 [IU] via SUBCUTANEOUS
  Administered 2015-04-28 – 2015-04-29 (×2): 2 [IU] via SUBCUTANEOUS

## 2015-04-26 MED ORDER — FUROSEMIDE 20 MG PO TABS
20.0000 mg | ORAL_TABLET | Freq: Every morning | ORAL | Status: DC
  Administered 2015-04-26 – 2015-04-30 (×5): 20 mg via ORAL
  Filled 2015-04-26 (×5): qty 1

## 2015-04-26 MED ORDER — IPRATROPIUM-ALBUTEROL 0.5-2.5 (3) MG/3ML IN SOLN
3.0000 mL | Freq: Three times a day (TID) | RESPIRATORY_TRACT | Status: DC
  Administered 2015-04-27: 3 mL via RESPIRATORY_TRACT
  Filled 2015-04-26 (×2): qty 3

## 2015-04-26 NOTE — Progress Notes (Signed)
Utilization review Completed Asmara Backs RN BSN   

## 2015-04-26 NOTE — Progress Notes (Signed)
Subjective: [name] is a patient of Dr. Emilee Hero who was admitted yesterday with community acquired pneumonia. Judgment treated with ceftriaxone and azithromycin. She has a fever this morning to 101.1. She states that she feels much better than she did yesterday however. She continues to cough with white sputum production. Chest x-ray reveals bilateral infiltrates worse on the right.  Objective: Vital signs in last 24 hours: Filed Vitals:   04/26/15 0032 04/26/15 0040 04/26/15 0627 04/26/15 0900  BP: 152/91  155/86   Pulse: 98  109   Temp: 98.6 F (37 C)  101.5 F (38.6 C) 101.1 F (38.4 C)  TempSrc: Oral  Oral Axillary  Resp: 20  20   Height: 5\' 3"  (1.6 m)     Weight: 158 lb 1.1 oz (71.7 kg)     SpO2: 98% 98% 95%    Weight change:   Intake/Output Summary (Last 24 hours) at 04/26/15 0939 Last data filed at 04/26/15 0736  Gross per 24 hour  Intake 273.75 ml  Output    300 ml  Net -26.25 ml    Physical Exam: Alert. No distress. Lungs reveal bilateral crackles. Heart tachycardic. Abdomen soft and nontender. Extremities reveal no edema.  Lab Results:    Results for orders placed or performed during the hospital encounter of 04/25/15 (from the past 24 hour(s))  CBC with Differential     Status: Abnormal   Collection Time: 04/25/15  6:41 PM  Result Value Ref Range   WBC 6.0 4.0 - 10.5 K/uL   RBC 5.39 (H) 3.87 - 5.11 MIL/uL   Hemoglobin 14.4 12.0 - 15.0 g/dL   HCT 45.5 36.0 - 46.0 %   MCV 84.4 78.0 - 100.0 fL   MCH 26.7 26.0 - 34.0 pg   MCHC 31.6 30.0 - 36.0 g/dL   RDW 14.0 11.5 - 15.5 %   Platelets 157 150 - 400 K/uL   Neutrophils Relative % 67 43 - 77 %   Neutro Abs 4.1 1.7 - 7.7 K/uL   Lymphocytes Relative 23 12 - 46 %   Lymphs Abs 1.4 0.7 - 4.0 K/uL   Monocytes Relative 8 3 - 12 %   Monocytes Absolute 0.5 0.1 - 1.0 K/uL   Eosinophils Relative 1 0 - 5 %   Eosinophils Absolute 0.1 0.0 - 0.7 K/uL   Basophils Relative 1 0 - 1 %   Basophils Absolute 0.0 0.0 - 0.1 K/uL   Comprehensive metabolic panel     Status: Abnormal   Collection Time: 04/25/15  6:41 PM  Result Value Ref Range   Sodium 145 135 - 145 mmol/L   Potassium 3.4 (L) 3.5 - 5.1 mmol/L   Chloride 107 101 - 111 mmol/L   CO2 28 22 - 32 mmol/L   Glucose, Bld 142 (H) 65 - 99 mg/dL   BUN 13 6 - 20 mg/dL   Creatinine, Ser 1.24 (H) 0.44 - 1.00 mg/dL   Calcium 9.4 8.9 - 10.3 mg/dL   Total Protein 7.3 6.5 - 8.1 g/dL   Albumin 3.8 3.5 - 5.0 g/dL   AST 18 15 - 41 U/L   ALT 10 (L) 14 - 54 U/L   Alkaline Phosphatase 134 (H) 38 - 126 U/L   Total Bilirubin 0.7 0.3 - 1.2 mg/dL   GFR calc non Af Amer 41 (L) >60 mL/min   GFR calc Af Amer 48 (L) >60 mL/min   Anion gap 10 5 - 15  Troponin I     Status: None  Collection Time: 04/25/15  6:41 PM  Result Value Ref Range   Troponin I 0.03 <0.031 ng/mL  Blood culture (routine x 2)     Status: None (Preliminary result)   Collection Time: 04/25/15  6:41 PM  Result Value Ref Range   Specimen Description LEFT ANTECUBITAL    Special Requests BOTTLES DRAWN AEROBIC AND ANAEROBIC 6CC    Culture NO GROWTH 1 DAY    Report Status PENDING   Lactic acid, plasma     Status: None   Collection Time: 04/25/15  6:41 PM  Result Value Ref Range   Lactic Acid, Venous 1.8 0.5 - 2.0 mmol/L  Brain natriuretic peptide     Status: Abnormal   Collection Time: 04/25/15  6:41 PM  Result Value Ref Range   B Natriuretic Peptide 1091.0 (H) 0.0 - 100.0 pg/mL  Blood culture (routine x 2)     Status: None (Preliminary result)   Collection Time: 04/25/15  6:59 PM  Result Value Ref Range   Specimen Description RIGHT ANTECUBITAL    Special Requests BOTTLES DRAWN AEROBIC ONLY 6CC    Culture NO GROWTH 1 DAY    Report Status PENDING   CBC     Status: None   Collection Time: 04/26/15  6:18 AM  Result Value Ref Range   WBC 6.5 4.0 - 10.5 K/uL   RBC 4.72 3.87 - 5.11 MIL/uL   Hemoglobin 12.3 12.0 - 15.0 g/dL   HCT 39.8 36.0 - 46.0 %   MCV 84.3 78.0 - 100.0 fL   MCH 26.1 26.0 - 34.0 pg    MCHC 30.9 30.0 - 36.0 g/dL   RDW 14.0 11.5 - 15.5 %   Platelets 154 150 - 400 K/uL  Basic metabolic panel     Status: Abnormal   Collection Time: 04/26/15  6:18 AM  Result Value Ref Range   Sodium 142 135 - 145 mmol/L   Potassium 3.5 3.5 - 5.1 mmol/L   Chloride 105 101 - 111 mmol/L   CO2 25 22 - 32 mmol/L   Glucose, Bld 181 (H) 65 - 99 mg/dL   BUN 10 6 - 20 mg/dL   Creatinine, Ser 1.15 (H) 0.44 - 1.00 mg/dL   Calcium 8.8 (L) 8.9 - 10.3 mg/dL   GFR calc non Af Amer 45 (L) >60 mL/min   GFR calc Af Amer 52 (L) >60 mL/min   Anion gap 12 5 - 15  Glucose, capillary     Status: Abnormal   Collection Time: 04/26/15  8:07 AM  Result Value Ref Range   Glucose-Capillary 167 (H) 65 - 99 mg/dL     ABGS No results for input(s): PHART, PO2ART, TCO2, HCO3 in the last 72 hours.  Invalid input(s): PCO2 CULTURES Recent Results (from the past 240 hour(s))  Blood culture (routine x 2)     Status: None (Preliminary result)   Collection Time: 04/25/15  6:41 PM  Result Value Ref Range Status   Specimen Description LEFT ANTECUBITAL  Final   Special Requests BOTTLES DRAWN AEROBIC AND ANAEROBIC 6CC  Final   Culture NO GROWTH 1 DAY  Final   Report Status PENDING  Incomplete  Blood culture (routine x 2)     Status: None (Preliminary result)   Collection Time: 04/25/15  6:59 PM  Result Value Ref Range Status   Specimen Description RIGHT ANTECUBITAL  Final   Special Requests BOTTLES DRAWN AEROBIC ONLY [address]  Final   Culture NO GROWTH 1 DAY  Final  Report Status PENDING  Incomplete   Studies/Results: Dg Chest Portable 1 View  04/25/2015   CLINICAL DATA:  Thick productive cough.  EXAM: PORTABLE CHEST - 1 VIEW  COMPARISON:  Radiograph 418 1,016  FINDINGS: Stable enlarged cardiac silhouette. There is bibasilar airspace disease greater on the right. No pneumothorax.  IMPRESSION: Bibasilar airspace disease concerning for multifocal pneumonia.   Electronically Signed   By: Suzy Bouchard M.D.   On:  04/25/2015 19:54   Micro Results: Recent Results (from the past 240 hour(s))  Blood culture (routine x 2)     Status: None (Preliminary result)   Collection Time: 04/25/15  6:41 PM  Result Value Ref Range Status   Specimen Description LEFT ANTECUBITAL  Final   Special Requests BOTTLES DRAWN AEROBIC AND ANAEROBIC 6CC  Final   Culture NO GROWTH 1 DAY  Final   Report Status PENDING  Incomplete  Blood culture (routine x 2)     Status: None (Preliminary result)   Collection Time: 04/25/15  6:59 PM  Result Value Ref Range Status   Specimen Description RIGHT ANTECUBITAL  Final   Special Requests BOTTLES DRAWN AEROBIC ONLY 6CC  Final   Culture NO GROWTH 1 DAY  Final   Report Status PENDING  Incomplete   Studies/Results: Dg Chest Portable 1 View  04/25/2015   CLINICAL DATA:  Thick productive cough.  EXAM: PORTABLE CHEST - 1 VIEW  COMPARISON:  Radiograph 418 1,016  FINDINGS: Stable enlarged cardiac silhouette. There is bibasilar airspace disease greater on the right. No pneumothorax.  IMPRESSION: Bibasilar airspace disease concerning for multifocal pneumonia.   Electronically Signed   By: Suzy Bouchard M.D.   On: 04/25/2015 19:54   Medications:  I have reviewed the patient's current medications Scheduled Meds: . azithromycin  500 mg Intravenous Q24H  . bisoprolol  5 mg Oral q morning - 10a  . cefTRIAXone (ROCEPHIN)  IV  1 g Intravenous Q24H  . enoxaparin (LOVENOX) injection  40 mg Subcutaneous Q24H  . furosemide  20 mg Oral q morning - 10a  . insulin aspart  0-15 Units Subcutaneous TID WC  . metFORMIN  500 mg Oral BID WC  . potassium chloride SA  20 mEq Oral Daily   Continuous Infusions: . sodium chloride 1 mL (04/26/15 0221)   PRN Meds:.acetaminophen, albuterol   Assessment/Plan: #1. Community-acquired pneumonia. Continue ceftriaxone and azithromycin. White count 6.0. #2. Acute respiratory failure. Presently oxygenating well. Nebulizer treatments as needed. She has used these at  home in the past. #3. Diabetes. Glucose 142. Continue low-dose metformin. #4. Congestive heart failure. Decrease fluids to [facility]. Continue daily Lasix dose. BNP is 1091. #5. Hypothyroidism. Recheck TSH. #6. Chronic kidney disease. Estimated GFR is 48 with a creatinine of 1.24. #7. Hypokalemia. Improved to 3.5. #8. Tachycardia. This appears to be sinus on telemetry currently. Repeat EKG. Active Problems:   Essential hypertension, benign   CAP (community acquired pneumonia)   Acute respiratory failure with hypoxia   Acute renal injury   DM2 (diabetes mellitus, type 2)     LOS: 1 day   Justino Boze 04/26/2015, 9:39 AM

## 2015-04-27 LAB — LEGIONELLA ANTIGEN, URINE

## 2015-04-27 LAB — GLUCOSE, CAPILLARY
Glucose-Capillary: 144 mg/dL — ABNORMAL HIGH (ref 65–99)
Glucose-Capillary: 127 mg/dL — ABNORMAL HIGH (ref 65–99)
Glucose-Capillary: 113 mg/dL — ABNORMAL HIGH (ref 65–99)
Glucose-Capillary: 133 mg/dL — ABNORMAL HIGH (ref 65–99)

## 2015-04-27 LAB — HIV ANTIBODY (ROUTINE TESTING W REFLEX): HIV Screen 4th Generation wRfx: NONREACTIVE

## 2015-04-27 MED ORDER — IPRATROPIUM-ALBUTEROL 0.5-2.5 (3) MG/3ML IN SOLN
3.0000 mL | Freq: Two times a day (BID) | RESPIRATORY_TRACT | Status: DC
  Administered 2015-04-27 – 2015-04-30 (×6): 3 mL via RESPIRATORY_TRACT
  Filled 2015-04-27 (×6): qty 3

## 2015-04-27 NOTE — Progress Notes (Signed)
Subjective: The patient is being treated for bronchopneumonia. She is still running low-grade fever but is feeling better. She continues to cough intermittently  Objective: Vital signs in last 24 hours: Temp:  [98.6 F (37 C)-101.5 F (38.6 C)] 99.3 F (37.4 C) (05/16 0613) Pulse Rate:  [92-109] 92 (05/16 0613) Resp:  [20] 20 (05/16 0613) BP: (135-155)/(83-101) 135/96 mmHg (05/16 0613) SpO2:  [89 %-98 %] 98 % (05/15 2208) Weight change:  Last BM Date: 04/26/15  Intake/Output from previous day: 05/15 0701 - 05/16 0700 In: 654.2 [P.O.:240; I.V.:414.2] Out: 300 [Urine:300] Intake/Output this shift:    Physical Exam: Gen. appearance patient is alert and oriented  HEENT negative  Neck supple no JVD or thyroid abnormalities  Heart regular rhythm no murmurs  Lungs occasional rhonchus heard over lower lung field  Abdomen no palpable organs or masses  Extremities free of edema   Recent Labs  04/25/15 1841 04/26/15 0618  WBC 6.0 6.5  HGB 14.4 12.3  HCT 45.5 39.8  PLT 157 154   BMET  Recent Labs  04/25/15 1841 04/26/15 0618  NA 145 142  K 3.4* 3.5  CL 107 105  CO2 28 25  GLUCOSE 142* 181*  BUN 13 10  CREATININE 1.24* 1.15*  CALCIUM 9.4 8.8*    Studies/Results: Dg Chest Portable 1 View  04/25/2015   CLINICAL DATA:  Thick productive cough.  EXAM: PORTABLE CHEST - 1 VIEW  COMPARISON:  Radiograph 418 1,016  FINDINGS: Stable enlarged cardiac silhouette. There is bibasilar airspace disease greater on the right. No pneumothorax.  IMPRESSION: Bibasilar airspace disease concerning for multifocal pneumonia.   Electronically Signed   By: Suzy Bouchard M.D.   On: 04/25/2015 19:54    Medications:  . azithromycin  500 mg Intravenous Q24H  . bisoprolol  5 mg Oral q morning - 10a  . cefTRIAXone (ROCEPHIN)  IV  1 g Intravenous Q24H  . enoxaparin (LOVENOX) injection  40 mg Subcutaneous Q24H  . furosemide  20 mg Oral q morning - 10a  . insulin aspart  0-15 Units  Subcutaneous TID WC  . ipratropium-albuterol  3 mL Nebulization TID  . metFORMIN  500 mg Oral BID WC  . potassium chloride SA  20 mEq Oral Daily    . sodium chloride 10 mL/hr at 04/26/15 1050     Assessment/Plan: 1. Community-acquired pneumonia-plan to continue current IV antibiotics Ceftin triamcinolone and azithromycin  2 diabetes mellitus-continue to monitor blood sugars continue metformin  3. Congestive heart failure-continue to monitor weights continue Lasix   LOS: 2 days   [name] G 04/27/2015, 6:18 AM

## 2015-04-27 NOTE — Progress Notes (Signed)
Coughing scant amount blood-tinged sputum, patient reports not a new problem.  Will leave sticky note for MD.

## 2015-04-27 NOTE — Progress Notes (Signed)
Pharmacist Heart Failure Core Measure Documentation  Assessment:  Rationale: Heart failure patients with left ventricular systolic dysfunction (LVSD) and an EF < 40% should be prescribed an angiotensin converting enzyme inhibitor (ACEI) or angiotensin receptor blocker (ARB) at discharge unless a contraindication is documented in the medical record.  This patient is not currently on an ACEI or ARB for HF.  This note is being placed in the record in order to provide documentation that a contraindication to the use of these agents is present for this encounter.  ACE Inhibitor or Angiotensin Receptor Blocker is contraindicated (specify all that apply)  []   ACEI allergy AND ARB allergy []   Angioedema []   Moderate or severe aortic stenosis []   Hyperkalemia []   Hypotension []   Renal artery stenosis [x]   Worsening renal function, preexisting renal disease or dysfunction   [name] 04/27/2015 4:34 PM

## 2015-04-27 NOTE — Care Management Note (Signed)
Case Management Note  Patient Details  Name: [name] MRN: [id number] Date of Birth: June 18, 1936  Subjective/Objective:                  Pt admitted from home with pneumonia. Pt lives with her grandson and will return home at discharge. Pt is fairly independent with ADL's. Pt does have a CAP aide 1-3 M-F. Pt has a neb machine for home use.  Action/Plan: Will continue to follow for discharge. ? Need for home O2 and HH at discharge.  Expected Discharge Date:  04/29/15               Expected Discharge Plan:  [place]  In-House Referral:  NA  Discharge planning Services  CM Consult  Post Acute Care Choice:    Choice offered to:     DME Arranged:    DME Agency:     HH Arranged:    HH Agency:     Status of Service:  In process, will continue to follow  Medicare Important Message Given:    Date Medicare IM Given:    Medicare IM give by:    Date Additional Medicare IM Given:    Additional Medicare Important Message give by:     If discussed at [place] of Stay Meetings, dates discussed:    Additional Comments:  Joylene Draft, RN 04/27/2015, 3:58 PM

## 2015-04-28 ENCOUNTER — Inpatient Hospital Stay (HOSPITAL_COMMUNITY): Payer: Medicare Other

## 2015-04-28 LAB — GLUCOSE, CAPILLARY
Glucose-Capillary: 113 mg/dL — ABNORMAL HIGH (ref 65–99)
Glucose-Capillary: 165 mg/dL — ABNORMAL HIGH (ref 65–99)
Glucose-Capillary: 125 mg/dL — ABNORMAL HIGH (ref 65–99)
Glucose-Capillary: 126 mg/dL — ABNORMAL HIGH (ref 65–99)

## 2015-04-28 MED ORDER — ONDANSETRON HCL 4 MG/2ML IJ SOLN
4.0000 mg | Freq: Four times a day (QID) | INTRAMUSCULAR | Status: DC | PRN
  Filled 2015-04-28: qty 2

## 2015-04-28 NOTE — Evaluation (Signed)
Physical Therapy Evaluation Patient Details Name: [name] MRN: [id number] DOB: [birth year] Today's Date: 04/28/2015   History of Present Illness  The history of asthma, nonischemic cardiomyopathy with an LVEF of 30-35% on last evaluation 06/07/2014 with grade 3 diastolic dysfunction, moderate to severe mitral valve regurgitation. She also has hypertension, cerebrovascular disease status post TIA and stroke. She also has a history of hypothyroidism, but is currently untreated. Finally, she has type 2 diabetes and is currently on metformin. She presents to the hospital with 2 days of worsening dyspnea, cough, fever. She is dyspneic to proximal only 20 feet. Her dyspnea worse on exertion and improved with rest. She's been taking her inhaler without dramatic improvement. She was recently diagnosed with a pneumonia last month and was treated with Levaquin and prednisone in the outpatient setting. She had good resolution until 2 days ago. Her cough is productive with clear sputum.  Clinical Impression   Pt is seen for evaluation and found to be at prior functional level.  She is currently on supplemental O2 with O2 sat=96%.  I removed cannula for gait and her O2 sat=89% after walking.  O2 cannula was replaced.  She should transition well to home.    Follow Up Recommendations No PT follow up    Equipment Recommendations  None recommended by PT    Recommendations for Other Services   none    Precautions / Restrictions Precautions Precautions: Fall Restrictions Weight Bearing Restrictions: No      Mobility  Bed Mobility Overal bed mobility: Independent                Transfers Overall transfer level: Independent                  Ambulation/Gait Ambulation/Gait assistance: Supervision Ambulation Distance (Feet): 200 Feet Assistive device: None Gait Pattern/deviations: WFL(Within Functional Limits)   Gait velocity interpretation: at or above normal speed for  age/gender General Gait Details: good stability  Stairs            Wheelchair Mobility    Modified Rankin (Stroke Patients Only)       Balance Overall balance assessment: No apparent balance deficits (not formally assessed)                                           Pertinent Vitals/Pain Pain Assessment: No/denies pain    Home Living Family/patient expects to be discharged to:: Private residence Living Arrangements: Children Available Help at Discharge: Family;Available 24 hours/day Type of Home: House Home Access: Level entry     Home Layout: One level Home Equipment: None Additional Comments: above info is by pt report.Marland KitchenMarland KitchenI assume it is correct    Prior Function Level of Independence: Independent         Comments: per pt report     Hand Dominance   Dominant Hand: Right    Extremity/Trunk Assessment   Upper Extremity Assessment: Overall WFL for tasks assessed           Lower Extremity Assessment: Overall WFL for tasks assessed         Communication   Communication: No difficulties  Cognition Arousal/Alertness: Awake/alert Behavior During Therapy: WFL for tasks assessed/performed Overall Cognitive Status: Within Functional Limits for tasks assessed                      General Comments  Exercises        Assessment/Plan    PT Assessment Patent does not need any further PT services  PT Diagnosis     PT Problem List    PT Treatment Interventions     PT Goals (Current goals can be found in the Care Plan section) Acute Rehab PT Goals PT Goal Formulation: All assessment and education complete, DC therapy    Frequency     Barriers to discharge        Co-evaluation               End of Session Equipment Utilized During Treatment: Gait belt Activity Tolerance: Patient tolerated treatment well Patient left: in chair;with call bell/phone within reach;with chair alarm set           Time:  1347-1406 PT Time Calculation (min) (ACUTE ONLY): 19 min   Charges:   PT Evaluation $Initial PT Evaluation Tier I: 1 Procedure     PT G CodesOwens Shark, Miho Monda L  PT 04/28/2015, 2:10 PM

## 2015-04-28 NOTE — Progress Notes (Signed)
Subjective: The patient had a fair night. She is being treated for bronchopneumonia. She is afebrile but continues to cough intermittently  Objective: Vital signs in last 24 hours: Temp:  [98.5 F (36.9 C)-99.3 F (37.4 C)] 98.8 F (37.1 C) (05/16 2200) Pulse Rate:  [83-95] 83 (05/16 2200) Resp:  [18-20] 18 (05/16 2200) BP: (110-135)/(70-96) 110/70 mmHg (05/16 2200) SpO2:  [90 %-95 %] 95 % (05/16 2200) Weight change:  Last BM Date: 04/27/15  Intake/Output from previous day: 05/16 0701 - 05/17 0700 In: 509.8 [P.O.:240; I.V.:269.8] Out: 150 [Urine:150] Intake/Output this shift:    Physical Exam: Gen. appearance-the patient is alert and oriented  HEENT negative  Heart regular rhythm no murmurs  Lungs clear to P&A  Abdomen no palpable organs or masses  Extremities free of edema   Recent Labs  04/25/15 1841 04/26/15 0618  WBC 6.0 6.5  HGB 14.4 12.3  HCT 45.5 39.8  PLT 157 154   BMET  Recent Labs  04/25/15 1841 04/26/15 0618  NA 145 142  K 3.4* 3.5  CL 107 105  CO2 28 25  GLUCOSE 142* 181*  BUN 13 10  CREATININE 1.24* 1.15*  CALCIUM 9.4 8.8*    Studies/Results: No results found.  Medications:  . azithromycin  500 mg Intravenous Q24H  . bisoprolol  5 mg Oral q morning - 10a  . cefTRIAXone (ROCEPHIN)  IV  1 g Intravenous Q24H  . enoxaparin (LOVENOX) injection  40 mg Subcutaneous Q24H  . furosemide  20 mg Oral q morning - 10a  . insulin aspart  0-15 Units Subcutaneous TID WC  . ipratropium-albuterol  3 mL Nebulization BID  . metFORMIN  500 mg Oral BID WC  . potassium chloride SA  20 mEq Oral Daily    . sodium chloride 10 mL/hr at 04/26/15 1050     Assessment/Plan: 1. Community-acquired pneumonia-plan to continue current IV antibiotic Rocephin and azithromycin  2. Diabetes mellitus to continue to monitor blood sugars continue metformin  3. Congestive heart failure-continue to monitor weights continue Lasix-continue to monitor chemistries   LOS: 3 days   Clerance Umland G 04/28/2015, 5:52 AM

## 2015-04-29 LAB — GLUCOSE, CAPILLARY
Glucose-Capillary: 123 mg/dL — ABNORMAL HIGH (ref 65–99)
Glucose-Capillary: 117 mg/dL — ABNORMAL HIGH (ref 65–99)
Glucose-Capillary: 109 mg/dL — ABNORMAL HIGH (ref 65–99)
Glucose-Capillary: 133 mg/dL — ABNORMAL HIGH (ref 65–99)

## 2015-04-29 NOTE — Progress Notes (Signed)
Pt declined offer to ambulate. Pt stated she ambulated the previous shift. Will continue to encourage patient to ambulate throughout the shift.

## 2015-04-29 NOTE — Progress Notes (Signed)
Subjective: The patient had a fairly good night. She is being treated for bronchopneumonia. She remains afebrile but has occasional cough. Repeat chest x-ray showed improvement  Objective: Vital signs in last 24 hours: Temp:  [97.7 F (36.5 C)-98.1 F (36.7 C)] 98.1 F (36.7 C) (05/17 2033) Pulse Rate:  [75-80] 80 (05/17 2033) Resp:  [18-20] 20 (05/17 2033) BP: (113-122)/(76-80) 122/80 mmHg (05/17 2033) SpO2:  [95 %-100 %] 100 % (05/17 2033) Weight change:  Last BM Date: 04/28/15  Intake/Output from previous day: 05/17 0701 - 05/18 0700 In: 779.8 [P.O.:660; I.V.:119.8] Out: 100 [Urine:100] Intake/Output this shift: Total I/O In: 480 [P.O.:480] Out: -   Physical Exam: Gen. appearance the patient is alert and oriented  HEENT negative  Heart regular rhythm no murmurs  Lungs clear to P&A  Abdomen the palpable organs or masses  Extremities free of edema  No results for input(s): WBC, HGB, HCT, PLT in the last 72 hours. BMET No results for input(s): NA, K, CL, CO2, GLUCOSE, BUN, CREATININE, CALCIUM in the last 72 hours.  Studies/Results: Dg Chest 2 View  04/28/2015   CLINICAL DATA:  Followup bibasilar airspace disease, clinically diagnosed is pneumonia.  EXAM: CHEST  2 VIEW  COMPARISON:  04/25/2015.  FINDINGS: Stable enlarged cardiac silhouette. Mild increase in prominence of the pulmonary vasculature and interstitial markings. Minimal residual patchy opacity at both lung bases. Diffuse peribronchial thickening. No pleural fluid. Thoracic spine degenerative changes. Cholecystectomy clips.  IMPRESSION: 1. Significantly decreased bibasilar pneumonia or alveolar edema. 2. Stable cardiomegaly with mildly progressive pulmonary vascular congestion and minimal interstitial pulmonary edema.   Electronically Signed   By: Claudie Revering M.D.   On: 04/28/2015 09:44    Medications:  . azithromycin  500 mg Intravenous Q24H  . bisoprolol  5 mg Oral q morning - 10a  . cefTRIAXone  (ROCEPHIN)  IV  1 g Intravenous Q24H  . enoxaparin (LOVENOX) injection  40 mg Subcutaneous Q24H  . furosemide  20 mg Oral q morning - 10a  . insulin aspart  0-15 Units Subcutaneous TID WC  . ipratropium-albuterol  3 mL Nebulization BID  . metFORMIN  500 mg Oral BID WC  . potassium chloride SA  20 mEq Oral Daily    . sodium chloride 10 mL/hr at 04/28/15 0902     Assessment/Plan: 1. Community-acquired pneumonia-plan to continue current IV antibiotics Rocephin and azithromycin  2. Diabetes mellitus-continue to monitor blood sugars continue metformin  3. Congestive heart failure-continue to monitor weights continue diuretic Lasix continue previous meds   LOS: 4 days   Dalanie Kisner G 04/29/2015, 6:33 AM

## 2015-04-29 NOTE — Progress Notes (Signed)
Pt declined offer to ambulate. Pt indicated that she would ambulate in the morning. Will continue to encourage patient to ambulate throughout the shift.

## 2015-04-30 LAB — CULTURE, BLOOD (ROUTINE X 2)
Culture: NO GROWTH
Culture: NO GROWTH

## 2015-04-30 LAB — GLUCOSE, CAPILLARY
Glucose-Capillary: 98 mg/dL (ref 65–99)
Glucose-Capillary: 114 mg/dL — ABNORMAL HIGH (ref 65–99)

## 2015-04-30 NOTE — Care Management Note (Signed)
Case Management Note  Patient Details  Name: [name] MRN: [id number] Date of Birth: 1936/03/31  Subjective/Objective:                    Action/Plan:   Expected Discharge Date:  04/30/15               Expected Discharge Plan:  Home/Self Care  In-House Referral:  NA  Discharge planning Services  CM Consult  Post Acute Care Choice:  NA Choice offered to:  NA  DME Arranged:    DME Agency:     HH Arranged:    HH Agency:     Status of Service:  Completed, signed off  Medicare Important Message Given:  Yes Date Medicare IM Given:  04/30/15 Medicare IM give by:  Christinia Gully, RN BSN CM Date Additional Medicare IM Given:    Additional Medicare Important Message give by:     If discussed at [place] of Stay Meetings, dates discussed:    Additional Comments: Pt discharged home today. Pt refuses any home health services. Pt does not qualify for home O2 at this time. Pt and pts nurse aware of discharge arrangements. Christinia Gully [place], RN 04/30/2015, 11:23 AM

## 2015-04-30 NOTE — Progress Notes (Addendum)
Patient Saturations on Room Air at Rest =  96 %  Patient Saturations on Room Air while Ambulating = 92 %  Patient Saturations on 2 Liters of oxygen while Ambulating = 100 %

## 2015-04-30 NOTE — Discharge Summary (Signed)
Physician Discharge Summary  [name] TDV:[id number] DOB: 08-22-1936 DOA: 04/25/2015  PCP: Lanette Hampshire, MD  Admit date: 04/25/2015 Discharge date: 04/30/2015     Discharge Diagnoses:  1. 1. Community-acquired pneumonia 2. Acute respiratory failure hypoxia 3. Non-insulin-dependent diabetes 4. Chronic systolic and diastolic congestive heart failure 5. Essential hypertension  Discharge Condition: Stable Disposition: Home  Diet recommendation: 2000-calorie ADA 2 g sodium diet  Filed Weights   04/25/15 1826 04/26/15 0032  Weight: 72.576 kg (160 lb) 71.7 kg (158 lb 1.1 oz)    History of present illness:  The patient was admitted with dyspnea of acute onset. She does have a prior history of systolic and diastolic congestive heart failure with LV EF of 30-35% and 2 diabetes. She had increased dyspnea and cough prior to admission x-ray showed evidence of lower lobe pneumonia. He was noted she desaturated below 90 when ambulating and did have slightly elevated blood pressure which was treated. She was subsequently admitted to [facility] floor  Hospital Course:  The patient was started on IV fluids and IV antibiotics IV Rocephin 1 g daily and Zithromax 500 mg daily she is also continued on sliding scale insulin she is continued on furosemide 20 mg daily KCl 20 mEq daily nebulizer treatment and nasal O2. Her diabetes was controlled with metformin 500 mg blood cultures showed no growth subsequent x-ray showed improvement. The patient was ambulated without oxygen and O2 sats was maintained above 90% [facility]. It was felt she could be discharged on her previous medications   Discharge Instructions Patient is being discharged home on medications listed below. She is being asked to make follow-up appointment with primary care physician    Medication List    TAKE these medications        albuterol 108 (90 BASE) MCG/ACT inhaler  Commonly known as:  PROVENTIL  HFA;VENTOLIN HFA  Inhale 2 puffs into the lungs every 4 (four) hours as needed for wheezing or shortness of breath.     bisoprolol 5 MG tablet  Commonly known as:  ZEBETA  Take 5 mg by mouth every morning.     furosemide 20 MG tablet  Commonly known as:  LASIX  Take 20 mg by mouth every morning.     levofloxacin 500 MG tablet  Commonly known as:  LEVAQUIN  Take 1 tablet (500 mg total) by mouth daily.     metFORMIN 500 MG tablet  Commonly known as:  GLUCOPHAGE  Take by mouth 2 (two) times daily.     potassium chloride SA 20 MEQ tablet  Commonly known as:  K-DUR,KLOR-CON  Take 1 tablet (20 mEq total) by mouth daily.     predniSONE 20 MG tablet  Commonly known as:  DELTASONE  Take 2 tablets (40 mg total) by mouth daily.       No Known Allergies  The results of significant diagnostics from this hospitalization (including imaging, microbiology, ancillary and laboratory) are listed below for reference.    Significant Diagnostic Studies: Dg Chest 2 View  04/28/2015   CLINICAL DATA:  Followup bibasilar airspace disease, clinically diagnosed is pneumonia.  EXAM: CHEST  2 VIEW  COMPARISON:  04/25/2015.  FINDINGS: Stable enlarged cardiac silhouette. Mild increase in prominence of the pulmonary vasculature and interstitial markings. Minimal residual patchy opacity at both lung bases. Diffuse peribronchial thickening. No pleural fluid. Thoracic spine degenerative changes. Cholecystectomy clips.  IMPRESSION: 1. Significantly decreased bibasilar pneumonia or alveolar edema. 2. Stable cardiomegaly with mildly progressive pulmonary vascular  congestion and minimal interstitial pulmonary edema.   Electronically Signed   By: Claudie Revering M.D.   On: 04/28/2015 09:44   Dg Chest Portable 1 View  04/25/2015   CLINICAL DATA:  Thick productive cough.  EXAM: PORTABLE CHEST - 1 VIEW  COMPARISON:  Radiograph 418 1,016  FINDINGS: Stable enlarged cardiac silhouette. There is bibasilar airspace disease greater  on the right. No pneumothorax.  IMPRESSION: Bibasilar airspace disease concerning for multifocal pneumonia.   Electronically Signed   By: Suzy Bouchard M.D.   On: 04/25/2015 19:54    Microbiology: Recent Results (from the past 240 hour(s))  Blood culture (routine x 2)     Status: None (Preliminary result)   Collection Time: 04/25/15  6:41 PM  Result Value Ref Range Status   Specimen Description LEFT ANTECUBITAL  Final   Special Requests BOTTLES DRAWN AEROBIC AND ANAEROBIC 6CC  Final   Culture NO GROWTH 4 DAYS  Final   Report Status PENDING  Incomplete  Blood culture (routine x 2)     Status: None (Preliminary result)   Collection Time: 04/25/15  6:59 PM  Result Value Ref Range Status   Specimen Description RIGHT ANTECUBITAL  Final   Special Requests BOTTLES DRAWN AEROBIC ONLY 6CC  Final   Culture NO GROWTH 4 DAYS  Final   Report Status PENDING  Incomplete  Culture, sputum-assessment     Status: None   Collection Time: 04/26/15 10:45 AM  Result Value Ref Range Status   Specimen Description SPUTUM  Final   Special Requests NONE  Final   Sputum evaluation   Final    MICROSCOPIC FINDINGS SUGGEST THAT THIS SPECIMEN IS NOT REPRESENTATIVE OF LOWER RESPIRATORY SECRETIONS. PLEASE RECOLLECT. [place] ON [id number] BY WOODS, M    Report Status 04/26/2015 FINAL  Final     Labs: Basic Metabolic Panel:  Recent Labs Lab 04/25/15 1841 04/26/15 0618  NA 145 142  K 3.4* 3.5  CL 107 105  CO2 28 25  GLUCOSE 142* 181*  BUN 13 10  CREATININE 1.24* 1.15*  CALCIUM 9.4 8.8*   Liver Function Tests:  Recent Labs Lab 04/25/15 1841  AST 18  ALT 10*  ALKPHOS 134*  BILITOT 0.7  PROT 7.3  ALBUMIN 3.8   No results for input(s): LIPASE, AMYLASE in the last 168 hours. No results for input(s): AMMONIA in the last 168 hours. CBC:  Recent Labs Lab 04/25/15 1841 04/26/15 0618  WBC 6.0 6.5  NEUTROABS 4.1  --   HGB 14.4 12.3  HCT 45.5 39.8  MCV 84.4 84.3  PLT 157 154    Cardiac Enzymes:  Recent Labs Lab 04/25/15 1841  TROPONINI 0.03   BNP: BNP (last 3 results)  Recent Labs  03/09/15 1124 03/30/15 1941 04/25/15 1841  BNP 1144.0* 1841.0* 1091.0*    ProBNP (last 3 results)  Recent Labs  06/06/14 1215 07/08/14 1920  PROBNP 2651.0* 3524.0*    CBG:  Recent Labs Lab 04/28/15 2150 04/29/15 0758 04/29/15 1122 04/29/15 1621 04/29/15 2141  GLUCAP 126* 123* 117* 109* 133*    Active Problems:   Essential hypertension, benign   CAP (community acquired pneumonia)   Acute respiratory failure with hypoxia   Acute renal injury   DM2 (diabetes mellitus, type 2)   Time coordinating discharge: 30 minutes  Signed:  Marjean Donna, MD 04/30/2015, 6:25 AM

## 2015-04-30 NOTE — Progress Notes (Signed)
Pt's IV catheter removed and intact. Pt's IV site clean dry and intact. Discharge instructions and medications reviewed and discussed with patient and patient's grandson. All questions were answered and no further questions at this time. Received verbal report from Dr. [name] Sprang that patient's prescription refills would be called into [facility]. Pt escorted by nurse tech.

## 2015-07-02 ENCOUNTER — Encounter (HOSPITAL_COMMUNITY): Payer: Self-pay

## 2015-07-02 ENCOUNTER — Inpatient Hospital Stay (HOSPITAL_COMMUNITY)
Admission: EM | Admit: 2015-07-02 | Discharge: 2015-07-06 | DRG: 292 | Disposition: A | Discharge status: Home Health | Payer: Medicare Other | Attending: Family Medicine | Admitting: Family Medicine

## 2015-07-02 ENCOUNTER — Emergency Department (HOSPITAL_COMMUNITY): Payer: Medicare Other

## 2015-07-02 ENCOUNTER — Inpatient Hospital Stay (HOSPITAL_COMMUNITY): Payer: Medicare Other

## 2015-07-02 DIAGNOSIS — I2510 Atherosclerotic heart disease of native coronary artery without angina pectoris: Secondary | ICD-10-CM | POA: Diagnosis present

## 2015-07-02 DIAGNOSIS — R0602 Shortness of breath: Secondary | ICD-10-CM | POA: Diagnosis present

## 2015-07-02 DIAGNOSIS — I509 Heart failure, unspecified: Secondary | ICD-10-CM

## 2015-07-02 DIAGNOSIS — E039 Hypothyroidism, unspecified: Secondary | ICD-10-CM | POA: Diagnosis present

## 2015-07-02 DIAGNOSIS — I10 Essential (primary) hypertension: Secondary | ICD-10-CM | POA: Diagnosis present

## 2015-07-02 DIAGNOSIS — J45909 Unspecified asthma, uncomplicated: Secondary | ICD-10-CM | POA: Diagnosis present

## 2015-07-02 DIAGNOSIS — R7989 Other specified abnormal findings of blood chemistry: Secondary | ICD-10-CM | POA: Diagnosis present

## 2015-07-02 DIAGNOSIS — Z9111 Patient's noncompliance with dietary regimen: Secondary | ICD-10-CM | POA: Diagnosis present

## 2015-07-02 DIAGNOSIS — Z8673 Personal history of transient ischemic attack (TIA), and cerebral infarction without residual deficits: Secondary | ICD-10-CM

## 2015-07-02 DIAGNOSIS — I429 Cardiomyopathy, unspecified: Secondary | ICD-10-CM | POA: Diagnosis present

## 2015-07-02 DIAGNOSIS — N289 Disorder of kidney and ureter, unspecified: Secondary | ICD-10-CM | POA: Diagnosis present

## 2015-07-02 DIAGNOSIS — I5043 Acute on chronic combined systolic (congestive) and diastolic (congestive) heart failure: Principal | ICD-10-CM

## 2015-07-02 DIAGNOSIS — E118 Type 2 diabetes mellitus with unspecified complications: Secondary | ICD-10-CM

## 2015-07-02 DIAGNOSIS — Z9114 Patient's other noncompliance with medication regimen: Secondary | ICD-10-CM | POA: Diagnosis present

## 2015-07-02 DIAGNOSIS — E119 Type 2 diabetes mellitus without complications: Secondary | ICD-10-CM | POA: Diagnosis present

## 2015-07-02 DIAGNOSIS — E1121 Type 2 diabetes mellitus with diabetic nephropathy: Secondary | ICD-10-CM

## 2015-07-02 DIAGNOSIS — I4510 Unspecified right bundle-branch block: Secondary | ICD-10-CM | POA: Diagnosis present

## 2015-07-02 DIAGNOSIS — R778 Other specified abnormalities of plasma proteins: Secondary | ICD-10-CM | POA: Diagnosis present

## 2015-07-02 HISTORY — DX: Influenza due to unidentified influenza virus with other respiratory manifestations: J11.1

## 2015-07-02 HISTORY — DX: Type 2 diabetes mellitus without complications: E11.9

## 2015-07-02 LAB — CBC WITH DIFFERENTIAL/PLATELET
Basophils Absolute: 0 10*3/uL (ref 0.0–0.1)
Basophils Relative: 0 % (ref 0–1)
Eosinophils Absolute: 0 10*3/uL (ref 0.0–0.7)
Eosinophils Relative: 1 % (ref 0–5)
HCT: 42.8 % (ref 36.0–46.0)
Hemoglobin: 13.3 g/dL (ref 12.0–15.0)
Lymphocytes Relative: 27 % (ref 12–46)
Lymphs Abs: 1.7 10*3/uL (ref 0.7–4.0)
MCH: 26.4 pg (ref 26.0–34.0)
MCHC: 31.1 g/dL (ref 30.0–36.0)
MCV: 85.1 fL (ref 78.0–100.0)
Monocytes Absolute: 0.4 10*3/uL (ref 0.1–1.0)
Monocytes Relative: 7 % (ref 3–12)
Neutro Abs: 4.2 10*3/uL (ref 1.7–7.7)
Neutrophils Relative %: 65 % (ref 43–77)
Platelets: 242 10*3/uL (ref 150–400)
RBC: 5.03 MIL/uL (ref 3.87–5.11)
RDW: 15.8 % — ABNORMAL HIGH (ref 11.5–15.5)
WBC: 6.4 10*3/uL (ref 4.0–10.5)

## 2015-07-02 LAB — BRAIN NATRIURETIC PEPTIDE: B Natriuretic Peptide: 2948 pg/mL — ABNORMAL HIGH (ref 0.0–100.0)

## 2015-07-02 LAB — COMPREHENSIVE METABOLIC PANEL
ALT: 47 U/L (ref 14–54)
AST: 56 U/L — ABNORMAL HIGH (ref 15–41)
Albumin: 3.6 g/dL (ref 3.5–5.0)
Alkaline Phosphatase: 262 U/L — ABNORMAL HIGH (ref 38–126)
Anion gap: 9 (ref 5–15)
BUN: 27 mg/dL — ABNORMAL HIGH (ref 6–20)
CO2: 22 mmol/L (ref 22–32)
Calcium: 9.5 mg/dL (ref 8.9–10.3)
Chloride: 114 mmol/L — ABNORMAL HIGH (ref 101–111)
Creatinine, Ser: 1.17 mg/dL — ABNORMAL HIGH (ref 0.44–1.00)
GFR calc Af Amer: 51 mL/min — ABNORMAL LOW (ref >60)
GFR calc non Af Amer: 44 mL/min — ABNORMAL LOW (ref >60)
Glucose, Bld: 182 mg/dL — ABNORMAL HIGH (ref 65–99)
Potassium: 4.2 mmol/L (ref 3.5–5.1)
Sodium: 145 mmol/L (ref 135–145)
Total Bilirubin: 0.8 mg/dL (ref 0.3–1.2)
Total Protein: 7 g/dL (ref 6.5–8.1)

## 2015-07-02 LAB — GLUCOSE, CAPILLARY
Glucose-Capillary: 128 mg/dL — ABNORMAL HIGH (ref 65–99)
Glucose-Capillary: 124 mg/dL — ABNORMAL HIGH (ref 65–99)
Glucose-Capillary: 111 mg/dL — ABNORMAL HIGH (ref 65–99)

## 2015-07-02 LAB — TROPONIN I
Troponin I: 0.08 ng/mL — ABNORMAL HIGH (ref <0.031)
Troponin I: 0.07 ng/mL — ABNORMAL HIGH (ref <0.031)
Troponin I: 0.06 ng/mL — ABNORMAL HIGH (ref <0.031)
Troponin I: 0.06 ng/mL — ABNORMAL HIGH (ref <0.031)

## 2015-07-02 LAB — PROTIME-INR
INR: 1.38 (ref 0.00–1.49)
Prothrombin Time: 17.1 seconds — ABNORMAL HIGH (ref 11.6–15.2)

## 2015-07-02 MED ORDER — ASPIRIN EC 81 MG PO TBEC
81.0000 mg | DELAYED_RELEASE_TABLET | Freq: Every day | ORAL | Status: DC
  Administered 2015-07-02 – 2015-07-06 (×5): 81 mg via ORAL
  Filled 2015-07-02 (×5): qty 1

## 2015-07-02 MED ORDER — ALBUTEROL SULFATE (2.5 MG/3ML) 0.083% IN NEBU: 2.5000 mg | INHALATION_SOLUTION | RESPIRATORY_TRACT | Status: DC | PRN

## 2015-07-02 MED ORDER — ALUM & MAG HYDROXIDE-SIMETH 200-200-20 MG/5ML PO SUSP: 30.0000 mL | Freq: Four times a day (QID) | ORAL | Status: DC | PRN

## 2015-07-02 MED ORDER — BISOPROLOL FUMARATE 5 MG PO TABS: 5.0000 mg | ORAL_TABLET | Freq: Every morning | ORAL | Status: DC

## 2015-07-02 MED ORDER — ALBUTEROL SULFATE HFA 108 (90 BASE) MCG/ACT IN AERS
2.0000 | INHALATION_SPRAY | RESPIRATORY_TRACT | Status: DC | PRN
  Filled 2015-07-02: qty 6.7

## 2015-07-02 MED ORDER — LISINOPRIL 5 MG PO TABS
5.0000 mg | ORAL_TABLET | Freq: Every day | ORAL | Status: DC
  Administered 2015-07-02 – 2015-07-06 (×5): 5 mg via ORAL
  Filled 2015-07-02 (×5): qty 1

## 2015-07-02 MED ORDER — ACETAMINOPHEN 650 MG RE SUPP: 650.0000 mg | Freq: Four times a day (QID) | RECTAL | Status: DC | PRN

## 2015-07-02 MED ORDER — FUROSEMIDE 10 MG/ML IJ SOLN
80.0000 mg | Freq: Once | INTRAMUSCULAR | Status: AC
  Administered 2015-07-02: 80 mg via INTRAVENOUS
  Filled 2015-07-02: qty 8

## 2015-07-02 MED ORDER — HYDROCODONE-ACETAMINOPHEN 5-325 MG PO TABS: 1.0000 | ORAL_TABLET | ORAL | Status: DC | PRN

## 2015-07-02 MED ORDER — POTASSIUM CHLORIDE CRYS ER 20 MEQ PO TBCR
20.0000 meq | EXTENDED_RELEASE_TABLET | Freq: Every day | ORAL | Status: DC
  Administered 2015-07-02: 20 meq via ORAL
  Filled 2015-07-02 (×3): qty 1

## 2015-07-02 MED ORDER — GUAIFENESIN-DM 100-10 MG/5ML PO SYRP: 5.0000 mL | ORAL_SOLUTION | ORAL | Status: DC | PRN

## 2015-07-02 MED ORDER — ENOXAPARIN SODIUM 40 MG/0.4ML ~~LOC~~ SOLN
40.0000 mg | SUBCUTANEOUS | Status: DC
  Administered 2015-07-02 – 2015-07-05 (×4): 40 mg via SUBCUTANEOUS
  Filled 2015-07-02 (×4): qty 0.4

## 2015-07-02 MED ORDER — INSULIN ASPART 100 UNIT/ML ~~LOC~~ SOLN
0.0000 [IU] | Freq: Three times a day (TID) | SUBCUTANEOUS | Status: DC
  Administered 2015-07-02 – 2015-07-03 (×3): 1 [IU] via SUBCUTANEOUS
  Administered 2015-07-03: 2 [IU] via SUBCUTANEOUS
  Administered 2015-07-03: 1 [IU] via SUBCUTANEOUS
  Administered 2015-07-04: 2 [IU] via SUBCUTANEOUS
  Administered 2015-07-04 – 2015-07-06 (×3): 1 [IU] via SUBCUTANEOUS

## 2015-07-02 MED ORDER — ACETAMINOPHEN 325 MG PO TABS
650.0000 mg | ORAL_TABLET | Freq: Four times a day (QID) | ORAL | Status: DC | PRN
  Administered 2015-07-04 – 2015-07-06 (×2): 650 mg via ORAL
  Filled 2015-07-02 (×2): qty 2

## 2015-07-02 MED ORDER — NITROGLYCERIN 2 % TD OINT
1.0000 [in_us] | TOPICAL_OINTMENT | Freq: Once | TRANSDERMAL | Status: AC
  Administered 2015-07-02: 1 [in_us] via TOPICAL
  Filled 2015-07-02: qty 1

## 2015-07-02 MED ORDER — HYDRALAZINE HCL 20 MG/ML IJ SOLN: 5.0000 mg | INTRAMUSCULAR | Status: DC | PRN

## 2015-07-02 MED ORDER — CARVEDILOL 3.125 MG PO TABS
3.1250 mg | ORAL_TABLET | Freq: Two times a day (BID) | ORAL | Status: DC
  Administered 2015-07-02 (×2): 3.125 mg via ORAL
  Filled 2015-07-02 (×6): qty 1

## 2015-07-02 MED ORDER — ISOSORBIDE MONONITRATE ER 30 MG PO TB24
15.0000 mg | ORAL_TABLET | Freq: Every day | ORAL | Status: DC
  Administered 2015-07-02 – 2015-07-06 (×5): 15 mg via ORAL
  Filled 2015-07-02 (×5): qty 1

## 2015-07-02 MED ORDER — INSULIN ASPART 100 UNIT/ML ~~LOC~~ SOLN: 0.0000 [IU] | Freq: Every day | SUBCUTANEOUS | Status: DC

## 2015-07-02 MED ORDER — FUROSEMIDE 10 MG/ML IJ SOLN
60.0000 mg | Freq: Two times a day (BID) | INTRAMUSCULAR | Status: DC
  Administered 2015-07-02 – 2015-07-06 (×8): 60 mg via INTRAVENOUS
  Filled 2015-07-02 (×8): qty 6

## 2015-07-02 MED ORDER — ONDANSETRON HCL 4 MG/2ML IJ SOLN: 4.0000 mg | Freq: Four times a day (QID) | INTRAMUSCULAR | Status: DC | PRN

## 2015-07-02 MED ORDER — ONDANSETRON HCL 4 MG PO TABS
4.0000 mg | ORAL_TABLET | Freq: Four times a day (QID) | ORAL | Status: DC | PRN
Start: 2015-07-02 — End: 2015-07-06

## 2015-07-02 NOTE — ED Notes (Signed)
Pt is a very hard IV stick. Lab attempted to get blood work without success. Oncoming lab techs will  Need to attempt again.

## 2015-07-02 NOTE — ED Notes (Signed)
Attempted report x1. 

## 2015-07-02 NOTE — ED Notes (Signed)
+  1 edema noted to BLE.

## 2015-07-02 NOTE — ED Notes (Signed)
Pt reports she has been sob while trying to lie down at night for several nights now, denies cp, states is using her inhaler without relief

## 2015-07-02 NOTE — H&P (Signed)
[facility] History and Physical  [name] SWF:[id number] DOB: August 20, 1936 DOA: 07/02/2015  Referring physician: ED physician, Dr. Tomi Bamberger PCP: Lanette Hampshire, MD   Chief Complaint: Shortness of breath and cough  HPI: [name] is a 79 y.o. female with a history of combined systolic and diastolic heart failure; last recorded echo was in 05/2014 and it revealed an EF of 30-35%, severe mitral regurgitation, and grade 3 diastolic dysfunction; history of diabetes mellitus, hypertension, and asthma. She presents to the emergency department with a complaint of shortness of breath and cough. She has also had an increase in swelling in her legs and her feet. Her symptoms started 2 days ago. She is nice chest pain or pleurisy. Her cough has been productive with clear sputum. She felt hot early this morning, but she denies out right fever or chills. She denies orthopnea, headache, nausea, vomiting, abdominal pain, pain with urination, or presyncopal symptomatology. She admits missing doses of her medications including Lasix occasionally. She reports eating a hotdog yesterday.  In the ED, she is hypertensive with a blood pressure ranging from 147/111-169/119. She is not tachycardic. Her oxygen saturations ranged from 88% on room air to 100% on nasal cannula oxygen. Her chest x-ray reveals bilateral central airspace opacities reflecting pulmonary edema or pneumonia; suspicion of left pleural effusion and mild cardiomegaly. Her EKG reveals a right bundle branch block, sinus rhythm with occasional PVCs, lateral T-wave abnormalities. Her lab data are significant for a BNP of 2948, troponin I of 0.08, creatinine of 1.17, and glucose of 182. She is being admitted for further evaluation and management.     Review of Systems:  Positive as above in history present illness. She also complains of not having enough energy lately. Otherwise review systems is negative.  Past Medical History  Diagnosis  Date  . Nonischemic cardiomyopathy     LVEF 20-25%>>30-35% 05/2014  . Asthma   . Cerebrovascular disease     TIA and stroke   . Essential hypertension, benign   . Coronary atherosclerosis of native coronary artery     Nonobstructive at cath 2005  . Hypothyroidism   . Carotid artery disease   . Gout   . Renal artery stenosis     Bilateral  . Bronchitis   . Diastolic dysfunction 01/2024    Grade 3  . DM2 (diabetes mellitus, type 2) 04/25/2015  . Acute on chronic combined systolic and diastolic CHF (congestive heart failure) 06/06/2014  . Influenza 12/10/2012  . Acute respiratory failure with hypoxia 04/25/2015   Past Surgical History  Procedure Laterality Date  . Abdominal hysterectomy     Social History: She is widowed. She has 10 children. Her grandson. He lives with her in [place]. She still drives. She denies alcohol, tobacco, or illicit drug use.   No Known Allergies  Family History  Problem Relation Age of Onset  . Asthma Son   Family-her parents are deceased, but patient cannot recall what they died of old wet their chronic medical conditions were.  Prior to Admission medications   Medication Sig Start Date End Date Taking? Authorizing Provider  albuterol (PROVENTIL HFA;VENTOLIN HFA) 108 (90 BASE) MCG/ACT inhaler Inhale 2 puffs into the lungs every 4 (four) hours as needed for wheezing or shortness of breath.    Historical Provider, MD  bisoprolol (ZEBETA) 5 MG tablet Take 5 mg by mouth every morning.    Historical Provider, MD  furosemide (LASIX) 20 MG tablet Take 20 mg by mouth every morning.  Historical Provider, MD  levofloxacin (LEVAQUIN) 500 MG tablet Take 1 tablet (500 mg total) by mouth daily. Patient not taking: Reported on 04/25/2015 03/30/15   Katie Ferguson, MD  metFORMIN (GLUCOPHAGE) 500 MG tablet Take by mouth 2 (two) times daily.    Historical Provider, MD  potassium chloride SA (K-DUR,KLOR-CON) 20 MEQ tablet Take 1 tablet (20 mEq total) by mouth daily.  06/11/14   Marjean Donna, MD  predniSONE (DELTASONE) 20 MG tablet Take 2 tablets (40 mg total) by mouth daily. Patient not taking: Reported on 03/30/2015 03/09/15   Virgel Manifold, MD   Physical Exam: Filed Vitals:   07/02/15 0749 07/02/15 0800 07/02/15 0830 07/02/15 0900  BP: 147/98 145/100 151/108 147/111  Pulse: 79 79 80 74  Temp:      TempSrc:      Resp:      Weight:      SpO2: 95% 98% 98% 99%    Wt Readings from Last 3 Encounters:  07/02/15 68.04 kg (150 lb)  04/26/15 71.7 kg (158 lb 1.1 oz)  03/30/15 72.576 kg (160 lb)    General:  Appears calm and comfortable; 79 year old African-American woman alert, in no acute distress. Eyes: PERRL, normal lids, irises & conjunctiva; conjunctivae are clear and sclerae are white. ENT: grossly normal hearing, lips & tongue; oropharynx reveals moist mucous membranes; absence of teeth. Neck: no LAD, masses or thyromegaly. Mild JVD. Cardiovascular: S1, S2, with occasional ectopy. 1+ non-pitting bilateral lower extremity edema. Telemetry: SR, with occasional PVCs Respiratory: Fine bilateral crackles in the mid and in the bases. Breathing nonlabored at rest. Abdomen: soft, positive bowel sounds, soft, nontender, nondistended. Skin: no rash or induration seen on limited exam Musculoskeletal: grossly normal tone BUE/BLE; no acute hot red joints. Psychiatric: Flat affect, otherwise alert and oriented. Neurologic: Cranial nerves II through XII are grossly intact.           Labs on Admission:  Basic Metabolic Panel:  Recent Labs Lab 07/02/15 0635  NA 145  K 4.2  CL 114*  CO2 22  GLUCOSE 182*  BUN 27*  CREATININE 1.17*  CALCIUM 9.5   Liver Function Tests:  Recent Labs Lab 07/02/15 0635  AST 56*  ALT 47  ALKPHOS 262*  BILITOT 0.8  PROT 7.0  ALBUMIN 3.6   No results for input(s): LIPASE, AMYLASE in the last 168 hours. No results for input(s): AMMONIA in the last 168 hours. CBC:  Recent Labs Lab 07/02/15 0635  WBC 6.4    NEUTROABS 4.2  HGB 13.3  HCT 42.8  MCV 85.1  PLT 242   Cardiac Enzymes:  Recent Labs Lab 07/02/15 0635  TROPONINI 0.08*    BNP (last 3 results)  Recent Labs  03/30/15 1941 04/25/15 1841 07/02/15 0635  BNP 1841.0* 1091.0* 2948.0*    ProBNP (last 3 results)  Recent Labs  07/08/14 1920  PROBNP 3524.0*    CBG: No results for input(s): GLUCAP in the last 168 hours.  Radiological Exams on Admission: Dg Chest 2 View  07/02/2015   CLINICAL DATA:  Acute onset of shortness of breath. Initial encounter.  EXAM: CHEST  2 VIEW  COMPARISON:  Chest radiograph performed 04/28/2015  FINDINGS: The lungs are well-aerated. Mild bilateral central airspace opacities may reflect mild pulmonary edema or pneumonia. A small left pleural effusion is suspected. There is no evidence of pneumothorax.  The heart is mildly enlarged. No acute osseous abnormalities are seen. Clips are noted within the right upper quadrant, reflecting prior cholecystectomy.  IMPRESSION: Mild bilateral central airspace opacities may reflect mild pulmonary edema or pneumonia. Suspect small left pleural effusion. Mild cardiomegaly.   Electronically Signed   By: Garald Balding M.D.   On: 07/02/2015 06:26    EKG: Independently reviewed.  Assessment/Plan Principal Problem:   Acute on chronic combined systolic and diastolic CHF (congestive heart failure) Active Problems:   Right bundle branch block   Essential hypertension   CORONARY ATHEROSCLEROSIS NATIVE CORONARY ARTERY   DM2 (diabetes mellitus, type 2)   Elevated troponin I level   Renal insufficiency   1. Acute on chronic combined systolic and diastolic congestive heart failure. The patient's chest x-ray and BNP are indicative of acute CHF. I doubt an infectious etiology given that she is afebrile and her white blood cell count is within normal limits. Her last recorded EF was 30-35% with grade 3 diastolic dysfunction. Patient has admitted to some noncompliance with  her medications and dietary indiscretion. This may have led to the exacerbation. She is also noted to have lower extremity edema. Patient received 80 mg of Lasix IV and nitroglycerin ointment in the ED. We will continue IV Lasix at 40 mg every 12 hours. Will discontinue nitroglycerin and add Imdur. Will discontinue bisoprolol and start twice a day dosing of carvedilol. We'll add lisinopril. Will order a 2-D echocardiogram. Of note the patient's TSH was within normal limits a couple of months ago. Will consult cardiology. Will insert Foley catheter for strict ins and outs and will order daily weights. 2. Elevated troponin I in the setting of CAD. This may be secondary to acute congestive heart failure or demand ischemia. Her EKG reveals lateral T-wave abnormalities. Will start aspirin and continue management as stated above. We'll cycle cardiac enzymes. Will order a fasting lipid panel. Will await cardiology's input. 3. Essential hypertension, malignant. As above, will start Lasix, Imdur, carvedilol, and lisinopril. Will add when necessary hydralazine. 4. Type 2 diabetes mellitus. The patient is treated with metformin chronically. This will be held and sliding scale NovoLog will be ordered. We'll also order hemoglobin A1c. 5. Mild renal insufficiency. The patient's creatinine over the past few months ranged from 0.9-1.24. Her creatinine today is 1.17. We'll continue to monitor.    Code Status: Full code DVT Prophylaxis: Lovenox Family Communication: Discussed with patient; family not available Disposition Plan: Discharge when clinically appropriate, likely in the next few days.  Time spent: One hour  [facility] Pager [phone]

## 2015-07-02 NOTE — ED Provider Notes (Signed)
CSN: [id number]     Arrival date & time 07/02/15  0319 History   First MD Initiated Contact with Patient 07/02/15 0502     Chief Complaint  Patient presents with  . Shortness of Breath     (Consider location/radiation/quality/duration/timing/severity/associated sxs/prior Treatment) HPI  Patient states tonight she started having shortness of breath, PND and a mild cough with clear sputum. She denies fevers or chills. She also reports mild dyspnea on exertion. She states she has chronic swelling of her legs however when she lays down at night it's gone in the morning. She denies any pain in her legs She denies any chest pain. She states she had bronchitis last month. She denies any history of congestive heart failure.  PCP Dr Everette [facility]  Past Medical History  Diagnosis Date  . Nonischemic cardiomyopathy     LVEF 20-25%>>30-35% 05/2015  . Asthma   . Cerebrovascular disease     TIA and stroke   . Essential hypertension, benign   . Coronary atherosclerosis of native coronary artery     Nonobstructive at cath 2005  . Hypothyroidism   . Carotid artery disease   . Gout   . Renal artery stenosis     Bilateral  . Bronchitis   . Diastolic dysfunction 0/2725    Grade 3   Past Surgical History  Procedure Laterality Date  . Abdominal hysterectomy     Family History  Problem Relation Age of Onset  . Asthma Son    History  Substance Use Topics  . Smoking status: Never Smoker   . Smokeless tobacco: Never Used  . Alcohol Use: No   Lives with GS  OB History    Gravida Para Term Preterm AB TAB SAB Ectopic Multiple Living   10 10 10             Review of Systems  All other systems reviewed and are negative.     Allergies  Review of patient's allergies indicates no known allergies.  Home Medications   Prior to Admission medications   Medication Sig Start Date End Date Taking? Authorizing Provider  albuterol (PROVENTIL HFA;VENTOLIN HFA) 108 (90 BASE)  MCG/ACT inhaler Inhale 2 puffs into the lungs every 4 (four) hours as needed for wheezing or shortness of breath.    Historical Provider, MD  bisoprolol (ZEBETA) 5 MG tablet Take 5 mg by mouth every morning.    Historical Provider, MD  furosemide (LASIX) 20 MG tablet Take 20 mg by mouth every morning.    Historical Provider, MD  levofloxacin (LEVAQUIN) 500 MG tablet Take 1 tablet (500 mg total) by mouth daily. Patient not taking: Reported on 04/25/2015 03/30/15   Milton Ferguson, MD  metFORMIN (GLUCOPHAGE) 500 MG tablet Take by mouth 2 (two) times daily.    Historical Provider, MD  potassium chloride SA (K-DUR,KLOR-CON) 20 MEQ tablet Take 1 tablet (20 mEq total) by mouth daily. 06/11/14   Marjean Donna, MD  predniSONE (DELTASONE) 20 MG tablet Take 2 tablets (40 mg total) by mouth daily. Patient not taking: Reported on 03/30/2015 03/09/15   Virgel Manifold, MD   BP 147/98 mmHg  Pulse 79  Temp(Src) 97.8 F (36.6 C) (Oral)  Resp 24  Wt 150 lb (68.04 kg)  SpO2 95%  Vital signs normal   Physical Exam  Constitutional: She is oriented to person, place, and time. She appears well-developed and well-nourished.  Non-toxic appearance. She does not appear ill. No distress.  HENT:  Head: Normocephalic and atraumatic.  Right Ear: External ear normal.  Left Ear: External ear normal.  Nose: Nose normal. No mucosal edema or rhinorrhea.  Mouth/Throat: Oropharynx is clear and moist and mucous membranes are normal. No dental abscesses or uvula swelling.  Eyes: Conjunctivae and EOM are normal. Pupils are equal, round, and reactive to light.  Neck: Normal range of motion and full passive range of motion without pain. Neck supple.  Cardiovascular: Normal rate, regular rhythm and normal heart sounds.  Exam reveals no gallop and no friction rub.   No murmur heard. Pulmonary/Chest: Effort normal. No tachypnea. No respiratory distress. She has decreased breath sounds. She has no wheezes. She has no rhonchi. She has no  rales. She exhibits no tenderness and no crepitus.  Abdominal: Soft. Normal appearance and bowel sounds are normal. She exhibits no distension. There is no tenderness. There is no rebound and no guarding.  Musculoskeletal: Normal range of motion. She exhibits no edema or tenderness.  Moves all extremities well. Patient has nonpitting edema on the dorsum of both feet the right worse than left   Neurological: She is alert and oriented to person, place, and time. She has normal strength. No cranial nerve deficit.  Skin: Skin is warm, dry and intact. No rash noted. No erythema. No pallor.  Psychiatric: She has a normal mood and affect. Her speech is normal and behavior is normal. Her mood appears not anxious.  Nursing note and vitals reviewed.   ED Course  Procedures (including critical care time) Medications  furosemide (LASIX) injection 80 mg (80 mg Intravenous Given 07/02/15 0749)  nitroGLYCERIN (NITROGLYN) 2 % ointment 1 inch (1 inch Topical Given 07/02/15 0749)   Patient had labs and chest x-ray ordered.  After reviewing patient's lab results and x-ray Lasix IV was ordered and nitroglycerin paste. Patient denies having chest pain. We discussed admission and she is agreeable  07:45 Dr Caryn Section, admit to tele, Dr Everette Rank attending  Labs Review Results for orders placed or performed during the hospital encounter of 07/02/15  Comprehensive metabolic panel  Result Value Ref Range   Sodium 145 135 - 145 mmol/L   Potassium 4.2 3.5 - 5.1 mmol/L   Chloride 114 (H) 101 - 111 mmol/L   CO2 22 22 - 32 mmol/L   Glucose, Bld 182 (H) 65 - 99 mg/dL   BUN 27 (H) 6 - 20 mg/dL   Creatinine, Ser 1.17 (H) 0.44 - 1.00 mg/dL   Calcium 9.5 8.9 - 10.3 mg/dL   Total Protein 7.0 6.5 - 8.1 g/dL   Albumin 3.6 3.5 - 5.0 g/dL   AST 56 (H) 15 - 41 U/L   ALT 47 14 - 54 U/L   Alkaline Phosphatase 262 (H) 38 - 126 U/L   Total Bilirubin 0.8 0.3 - 1.2 mg/dL   GFR calc non Af Amer 44 (L) >60 mL/min   GFR calc Af  Amer 51 (L) >60 mL/min   Anion gap 9 5 - 15  CBC with Differential  Result Value Ref Range   WBC 6.4 4.0 - 10.5 K/uL   RBC 5.03 3.87 - 5.11 MIL/uL   Hemoglobin 13.3 12.0 - 15.0 g/dL   HCT 42.8 36.0 - 46.0 %   MCV 85.1 78.0 - 100.0 fL   MCH 26.4 26.0 - 34.0 pg   MCHC 31.1 30.0 - 36.0 g/dL   RDW 15.8 (H) 11.5 - 15.5 %   Platelets 242 150 - 400 K/uL   Neutrophils Relative % 65 43 - 77 %  Neutro Abs 4.2 1.7 - 7.7 K/uL   Lymphocytes Relative 27 12 - 46 %   Lymphs Abs 1.7 0.7 - 4.0 K/uL   Monocytes Relative 7 3 - 12 %   Monocytes Absolute 0.4 0.1 - 1.0 K/uL   Eosinophils Relative 1 0 - 5 %   Eosinophils Absolute 0.0 0.0 - 0.7 K/uL   Basophils Relative 0 0 - 1 %   Basophils Absolute 0.0 0.0 - 0.1 K/uL  Troponin I  Result Value Ref Range   Troponin I 0.08 (H) <0.031 ng/mL  Brain natriuretic peptide  Result Value Ref Range   B Natriuretic Peptide 2948.0 (H) 0.0 - 100.0 pg/mL    Laboratory interpretation all normal except BNP elevated over her baseline of around one thousand, hyperglycemia, renal insufficiency, positive troponin   Imaging Review Dg Chest 2 View  07/02/2015   CLINICAL DATA:  Acute onset of shortness of breath. Initial encounter.  EXAM: CHEST  2 VIEW  COMPARISON:  Chest radiograph performed 04/28/2015  FINDINGS: The lungs are well-aerated. Mild bilateral central airspace opacities may reflect mild pulmonary edema or pneumonia. A small left pleural effusion is suspected. There is no evidence of pneumothorax.  The heart is mildly enlarged. No acute osseous abnormalities are seen. Clips are noted within the right upper quadrant, reflecting prior cholecystectomy.  IMPRESSION: Mild bilateral central airspace opacities may reflect mild pulmonary edema or pneumonia. Suspect small left pleural effusion. Mild cardiomegaly.   Electronically Signed   By: Garald Balding M.D.   On: 07/02/2015 06:26     EKG Interpretation   Date/Time:  Thursday July 02 2015 06:21:21 EDT Ventricular  Rate:  84 PR Interval:  168 QRS Duration: 132 QT Interval:  432 QTC Calculation: 510 R Axis:   84 Text Interpretation:  Sinus rhythm with sinus arrhythmia with occasional  Premature ventricular complexes Right bundle branch block Anterior infarct  , age undetermined Since last tracing 26 Apr 2015 T wave abnormality,  consider inferolateral ischemia Confirmed by Lakhia Gengler  MD-I, Kyser Wandel ([id number]) on  07/02/2015 7:26:05 AM      MDM   Final diagnoses:  Acute congestive heart failure, unspecified congestive heart failure type  Elevated troponin    Plan admission   Rolland Porter, MD, Barbette Or, MD 07/02/15 [phone]

## 2015-07-02 NOTE — ED Notes (Signed)
Admitting at bedside, pt using bsc

## 2015-07-02 NOTE — Consult Note (Signed)
CARDIOLOGY CONSULT NOTE   Patient ID: [name] MRN: [id number] DOB/AGE: [birth year] 77 y.o.  Admit Date: 07/02/2015 Referring Physician: PTH-Fisher MD Primary Physician: Lanette Hampshire, MD Consulting Cardiologist: Rozann Lesches MD Primary Cardiologist: Rozann Lesches MD (last seen 2013) Reason for Consultation: Mixed CHF  Clinical Summary [name] is a 79 y.o.female with NICM, LVEF of 30-35%% in June 2015, hypertension, and non-obstructive CAD. She presented to the emergency room with symptoms of dyspnea. We are asked for cardiology recommendations  She states symptoms began approximately 4 days ago after eating some hot dogs. She admits to dietary and medical nonadherence forgetting to take her medicines this week. She is evasive concerning other salty foods. She states that she normally has 2 pillow orthopnea, but has had worsening PND over the last few days. She has noticed some lower extremity edema, no chest pressure, diaphoresis, or nausea or vomiting. She denies palpitations. She states she was sitting on her back porch last night and had worsening shortness of breath, try to sleep but was unable to do so and therefore presented to the emergency room is symptoms persisted.  She presented to the ER with complaints of shortness of breath, and was diagnosis with pulmonary edema vs pneumonia. Small left pleural effusion on CXR. BP 158/103, HR 79 O2 sat 95%. Creatinine 1.17, glucose 182. Elevated LFT's  Troponin 0.08; Pro-BNP 2948. She was treated with lasix 80 mg IV X 1. NTG 1 inch paste. A Foley catheter has been placed, and on examination of her urine back. She appears to have diuresed approximately 900 cc. She is feeling some better, but feels weak. She falls asleep often during interview.  Medications Scheduled Medications: . aspirin EC  81 mg Oral Daily  . carvedilol  3.125 mg Oral BID WC  . enoxaparin (LOVENOX) injection  40 mg Subcutaneous Q24H  . furosemide  60  mg Intravenous Q12H  . insulin aspart  0-5 Units Subcutaneous QHS  . insulin aspart  0-9 Units Subcutaneous TID WC  . isosorbide mononitrate  15 mg Oral Daily  . lisinopril  5 mg Oral Daily  . potassium chloride SA  20 mEq Oral Daily   PRN Medications: acetaminophen **OR** acetaminophen, albuterol, alum & mag hydroxide-simeth, guaiFENesin-dextromethorphan, hydrALAZINE, HYDROcodone-acetaminophen, ondansetron **OR** ondansetron (ZOFRAN) IV   Past Medical History  Diagnosis Date  . Nonischemic cardiomyopathy     LVEF 20-25%>>30-35% 05/2014  . Asthma   . Cerebrovascular disease     TIA and stroke   . Essential hypertension, benign   . Coronary atherosclerosis of native coronary artery     Nonobstructive at cath 2005  . Hypothyroidism   . Carotid artery disease   . Gout   . Renal artery stenosis     Bilateral  . Bronchitis   . Diastolic dysfunction 04/7321    Grade 3  . DM2 (diabetes mellitus, type 2) 04/25/2015  . Acute on chronic combined systolic and diastolic CHF (congestive heart failure) 06/06/2014  . Influenza 12/10/2012  . Acute respiratory failure with hypoxia 04/25/2015    Past Surgical History  Procedure Laterality Date  . Abdominal hysterectomy      Family History  Problem Relation Age of Onset  . Asthma Son     Social History [name] reports that she has never smoked. She has never used smokeless tobacco. [name] reports that she does not drink alcohol.  Review of Systems Complete review of systems are found to be negative unless outlined in H&P above.  Physical Examination Blood pressure 146/88, pulse 74, temperature 97.8 F (36.6 C), temperature source Oral, resp. rate 22, height 5\' 2"  (1.575 m), weight 155 lb 6.4 oz (70.489 kg), SpO2 98 %.  Intake/Output Summary (Last 24 hours) at 07/02/15 1048 Last data filed at 07/02/15 1045  Gross per 24 hour  Intake      0 ml  Output   1620 ml  Net  -1620 ml    Telemetry: SR with RBBB.  WHQ:PRFFM, with  complaints of fatigue and weakness  HEENT: Conjunctiva and lids normal, oropharynx clear with moist mucosa. Neck: Supple, mildly elevated JVP or carotid bruits, no thyromegaly. Lungs: Inspiratory rales and rhonchi diminished in the bases , poor inspiratory effort  Cardiac: Regular rate and rhythm,distant, occasional extrasystole  no S3 or significant systolic murmur, no pericardial rub. Abdomen: Soft, nontender, no hepatomegaly, bowel sounds present, no guarding or rebound. Extremities: 1+ pretibial  pitting edema, distal pulses 2+. Skin: Warm and dry. Musculoskeletal: No kyphosis. Neuropsychiatric: Alert and oriented x3, affect grossly appropriate.  Prior Cardiac Testing/Procedures 1.Echocardiogram 06/08/2015  Left ventricle: The cavity size was normal. Wall thickness was increased in a pattern of moderate to severe LVH. Systolic function was moderately to severely reduced. The estimated ejection fraction was in the range of 30% to 35%. Severe hypokinesis of the entirelateral, inferolateral, and inferior myocardium. Doppler parameters are consistent with a reversible restrictive pattern, indicative of decreased left ventricular diastolic compliance and/or increased left atrial pressure (grade 3 diastolic dysfunction). - Mitral valve: There was moderate to severe regurgitation. - Left atrium: The atrium was moderately dilated. - Pulmonary arteries: Systolic pressure was moderately increased.   2. Cardiac Cath 02/24/2004 CORONARY ARTERIOGRAPHY: 1. The left main coronary artery was normal. 2. The left anterior descending had a long mid 30-40% stenosis. 3. The first diagonal was large and normal. 4. The circumflex and the AV groove was normal. There was a large branching  mid obtuse marginal. The superior branch had ostial 30% stenosis. 5. The right coronary artery was a large dominant vessel with a large PDA.  It was normal. CONCLUSION: 1. Nonobstructive  coronary artery disease. 2. Nonischemic cardiomyopathy. Possibly related to hypertension.  Lab Results  Basic Metabolic Panel:  Recent Labs Lab 07/02/15 0635  NA 145  K 4.2  CL 114*  CO2 22  GLUCOSE 182*  BUN 27*  CREATININE 1.17*  CALCIUM 9.5    Liver Function Tests:  Recent Labs Lab 07/02/15 0635  AST 56*  ALT 47  ALKPHOS 262*  BILITOT 0.8  PROT 7.0  ALBUMIN 3.6    CBC:  Recent Labs Lab 07/02/15 0635  WBC 6.4  NEUTROABS 4.2  HGB 13.3  HCT 42.8  MCV 85.1  PLT 242    Cardiac Enzymes:  Recent Labs Lab 07/02/15 0635 07/02/15 0939  TROPONINI 0.08* 0.07*   Radiology: Dg Chest 2 View  07/02/2015   CLINICAL DATA:  Acute onset of shortness of breath. Initial encounter.  EXAM: CHEST  2 VIEW  COMPARISON:  Chest radiograph performed 04/28/2015  FINDINGS: The lungs are well-aerated. Mild bilateral central airspace opacities may reflect mild pulmonary edema or pneumonia. A small left pleural effusion is suspected. There is no evidence of pneumothorax.  The heart is mildly enlarged. No acute osseous abnormalities are seen. Clips are noted within the right upper quadrant, reflecting prior cholecystectomy.  IMPRESSION: Mild bilateral central airspace opacities may reflect mild pulmonary edema or pneumonia. Suspect small left pleural effusion. Mild cardiomegaly.   Electronically Signed  By: Garald Balding M.D.   On: 07/02/2015 06:26    ECG: Sinus rhythm with right bundle-branch block, diffuse nonspecific ST-T wave abnormalities, PVC.   Impression and Recommendations  1. Acute on Chronic combined CHF: Multifactorial in the setting of dietary and medical nonadherence. Chest x-ray revealed bilateral mild pulmonary edema versus pneumonia with small left pleural effusion. She has been given IV Lasix 80 mg 1 and has been diuresing in what appears to be approximately 900 cc and her Foley catheter bag. She is on oxygen and is resting comfortably with the head of the bed  elevated. She is evasive concerning her medication regimen and dietary history. She admits to forgetting medications on occasion, especially this week.   Review of home medications has her on Lasix 20 mg in the a.m., she is also on Bisprolol and started on carvedilol 3.125 mg twice a day. She is being given potassium replacement. She continues on ACE inhibitor, lisinopril 5 mg daily, and isosorbide 50 mg daily. She currently has a nitroglycerin paste on her chest, which I will discontinue. Continue strict I&O and IV diuresis, monitoring kidney function.  2. NICM: Most recent cardiac catheterization was completed in 2005, which revealed nonobstructive disease in the LAD and OM branch of the circumflex. Echocardiogram completed June 2015 demonstrated ejection fraction of 30-35% and doppler parameters consistent with a reversible restrictive pattern, indicative of decreased left ventriculardiastolic compliance and/or increased left atrial pressure (grade 3 diastolic dysfunction). She has not had any further ischemic testing since cardiac catheterization.  3.  Hypertension: BP is currently better controlled. She is currently continued on lisinopril, BB, and nitrates with hydralazine prn.   4. Medical and Dietary Non-Adherence; Consider social services consultation and possible referral to [facility].   Signed: Phill Myron. Lawrence NP AACC  07/02/2015, 10:48 AM Co-Sign MD   Attending note:  Patient seen and examined. Reviewed records and evaluation by [name] NP. Ms. Placide presents with worsening shortness of breath, reportedly over the last few days and associated with mild cough and leg edema. She has had some atypical chest discomfort as well. Her history includes nonischemic cardiomyopathy with LVEF 30-35% as of June 2015 and previously documented mild coronary atherosclerosis as of 2005. In discussing her dietary intake, she does not restrict sodium to any significant degree, and it is also not  entirely clear how compliant she has been with her medications. She was last seen in the office in November 2015. Her weight is actually down 10 pounds compared to her office visit from last year, but she has clear evidence of volume overload on examination including leg edema, decreased breath sounds at the bases with evidence of small pleural effusions. Her presenting chest x-ray also reflected mild pulmonary edema and stable cardiomegaly. Blood pressure significantly elevated at presentation, also raising concern about medication compliance. BNP 2948, troponin I levels 0.08 and 0.07 are most consistent with heart failure rather than ACS. She has been placed on IV Lasix for further diuresis, also on low-dose Coreg, Imdur, lisinopril, and potassium supplementation. Would work toward adding Aldactone depending on stability in renal function with further diuresis. Follow-up echocardiogram will be obtained to reassess LVEF. At this point do not anticipate further ischemic evaluation, rather optimizing medical therapy and arranging more close outpatient follow-up.  Satira Sark, M.D., F.A.C.C.

## 2015-07-03 LAB — CBC
HCT: 40.6 % (ref 36.0–46.0)
Hemoglobin: 13 g/dL (ref 12.0–15.0)
MCH: 26.9 pg (ref 26.0–34.0)
MCHC: 32 g/dL (ref 30.0–36.0)
MCV: 84.1 fL (ref 78.0–100.0)
Platelets: 230 10*3/uL (ref 150–400)
RBC: 4.83 MIL/uL (ref 3.87–5.11)
RDW: 15.5 % (ref 11.5–15.5)
WBC: 6.3 10*3/uL (ref 4.0–10.5)

## 2015-07-03 LAB — BASIC METABOLIC PANEL
Anion gap: 11 (ref 5–15)
BUN: 29 mg/dL — ABNORMAL HIGH (ref 6–20)
CO2: 24 mmol/L (ref 22–32)
Calcium: 9.2 mg/dL (ref 8.9–10.3)
Chloride: 106 mmol/L (ref 101–111)
Creatinine, Ser: 1.39 mg/dL — ABNORMAL HIGH (ref 0.44–1.00)
GFR calc Af Amer: 42 mL/min — ABNORMAL LOW (ref >60)
GFR calc non Af Amer: 36 mL/min — ABNORMAL LOW (ref >60)
Glucose, Bld: 216 mg/dL — ABNORMAL HIGH (ref 65–99)
Potassium: 3.6 mmol/L (ref 3.5–5.1)
Sodium: 141 mmol/L (ref 135–145)

## 2015-07-03 LAB — GLUCOSE, CAPILLARY
Glucose-Capillary: 187 mg/dL — ABNORMAL HIGH (ref 65–99)
Glucose-Capillary: 144 mg/dL — ABNORMAL HIGH (ref 65–99)
Glucose-Capillary: 128 mg/dL — ABNORMAL HIGH (ref 65–99)
Glucose-Capillary: 154 mg/dL — ABNORMAL HIGH (ref 65–99)

## 2015-07-03 LAB — LIPID PANEL
Cholesterol: 139 mg/dL (ref 0–200)
HDL: 35 mg/dL — ABNORMAL LOW (ref >40)
LDL Cholesterol: 74 mg/dL (ref 0–99)
Total CHOL/HDL Ratio: 4 RATIO
Triglycerides: 151 mg/dL — ABNORMAL HIGH (ref <150)
VLDL: 30 mg/dL (ref 0–40)

## 2015-07-03 LAB — HEMOGLOBIN A1C
Hgb A1c MFr Bld: 7.3 % — ABNORMAL HIGH (ref 4.8–5.6)
Mean Plasma Glucose: 163 mg/dL

## 2015-07-03 MED ORDER — POTASSIUM CHLORIDE CRYS ER 20 MEQ PO TBCR
40.0000 meq | EXTENDED_RELEASE_TABLET | Freq: Every day | ORAL | Status: DC
  Administered 2015-07-03 – 2015-07-06 (×4): 40 meq via ORAL
  Filled 2015-07-03 (×3): qty 2

## 2015-07-03 MED ORDER — CARVEDILOL 3.125 MG PO TABS
6.2500 mg | ORAL_TABLET | Freq: Two times a day (BID) | ORAL | Status: DC
  Administered 2015-07-03 – 2015-07-06 (×6): 6.25 mg via ORAL
  Filled 2015-07-03 (×6): qty 2

## 2015-07-03 NOTE — Progress Notes (Signed)
Consulting cardiologist: Jenkins Rouge MD Primary Cardiologist: Rozann Lesches MD  Cardiology Specific Problem List:  1. Acute on Chronic Mixed CHF 2. NICM  Subjective:    Breathing much better. Feels better  Objective:   Temp:  [97.2 F (36.2 C)-98.4 F (36.9 C)] 98.3 F (36.8 C) (07/22 0539) Pulse Rate:  [65-85] 73 (07/22 0539) Resp:  [21-22] 21 (07/22 0539) BP: (120-151)/(62-111) 151/91 mmHg (07/22 0539) SpO2:  [94 %-100 %] 95 % (07/22 0539) Weight:  [151 lb 11.2 oz (68.811 kg)-155 lb 6.4 oz (70.489 kg)] 151 lb 11.2 oz (68.811 kg) (07/22 0539) Last BM Date: 07/02/15  Filed Weights   07/02/15 0358 07/02/15 1027 07/03/15 0539  Weight: 150 lb (68.04 kg) 155 lb 6.4 oz (70.489 kg) 151 lb 11.2 oz (68.811 kg)    Intake/Output Summary (Last 24 hours) at 07/03/15 0807 Last data filed at 07/03/15 0547  Gross per 24 hour  Intake    600 ml  Output   5370 ml  Net  -4770 ml    Telemetry:NSR rate in the 70's.   Exam:  General: No acute distress.  HEENT: Conjunctiva and lids normal, oropharynx clear.  Lungs:Diminished breath sounds in the bases, especially on the left,, no wheezes, otherwise clear  Cardiac: No elevated JVP or bruits. RRR, no gallop or rub.   Abdomen: Normoactive bowel sounds, nontender, nondistended.  Extremities: 1+ pitting edema, distal pulses full.  Neuropsychiatric: Alert and oriented x3, affect appropriate.   Lab Results:  Basic Metabolic Panel:  Recent Labs Lab 07/02/15 0635 07/03/15 0652  NA 145 141  K 4.2 3.6  CL 114* 106  CO2 22 24  GLUCOSE 182* 216*  BUN 27* 29*  CREATININE 1.17* 1.39*  CALCIUM 9.5 9.2    Liver Function Tests:  Recent Labs Lab 07/02/15 0635  AST 56*  ALT 47  ALKPHOS 262*  BILITOT 0.8  PROT 7.0  ALBUMIN 3.6    CBC:  Recent Labs Lab 07/02/15 0635 07/03/15 0652  WBC 6.4 6.3  HGB 13.3 13.0  HCT 42.8 40.6  MCV 85.1 84.1  PLT 242 230    Cardiac Enzymes:  Recent Labs Lab  07/02/15 0939 07/02/15 1525 07/02/15 2039  TROPONINI 0.07* 0.06* 0.06*    BNP:  Recent Labs  07/08/14 1920  PROBNP 3524.0*    Coagulation:  Recent Labs Lab 07/02/15 0939  INR 1.38   Echocardiogram 07/02/2015 Left ventricle: The cavity size was normal. Wall thickness was increased in a pattern of moderate LVH. Systolic function was severely reduced. The estimated ejection fraction was in the range of 20% to 25%. Diffuse hypokinesis. Features are consistent with a pseudonormal left ventricular filling pattern, with concomitant abnormal relaxation and increased filling pressure (grade 2 diastolic dysfunction). - Aortic valve: Mildly calcified annulus. Trileaflet; mildly thickened leaflets. Valve area (VTI): 2.18 cm^2. Valve area (Vmax): 2.21 cm^2. - Mitral valve: Mildly calcified annulus. Mildly thickened leaflets . There was moderate regurgitation. The MR vena contracta is 0.4 cm. - Left atrium: The atrium was severely dilated. - Right ventricle: The interventricular septum is flattened in systole consistent with RV pressure overload. The cavity size was mildly dilated. Systolic function was moderately reduced. - Right atrium: The atrium was mildly dilated. - Systemic veins: The IVC is dilated with normal respiratory variation, estimated RA pressure is 8 mmHg. - Technically adequate study.  Radiology: Dg Chest 2 View  07/02/2015   CLINICAL DATA:  Acute onset of shortness of breath. Initial encounter.  EXAM: CHEST  2 VIEW  COMPARISON:  Chest radiograph performed 04/28/2015  FINDINGS: The lungs are well-aerated. Mild bilateral central airspace opacities may reflect mild pulmonary edema or pneumonia. A small left pleural effusion is suspected. There is no evidence of pneumothorax.  The heart is mildly enlarged. No acute osseous abnormalities are seen. Clips are noted within the right upper quadrant, reflecting prior cholecystectomy.  IMPRESSION: Mild  bilateral central airspace opacities may reflect mild pulmonary edema or pneumonia. Suspect small left pleural effusion. Mild cardiomegaly.   Electronically Signed   By: Garald Balding M.D.   On: 07/02/2015 06:26     Medications:   Scheduled Medications: . aspirin EC  81 mg Oral Daily  . carvedilol  3.125 mg Oral BID WC  . enoxaparin (LOVENOX) injection  40 mg Subcutaneous Q24H  . furosemide  60 mg Intravenous Q12H  . insulin aspart  0-5 Units Subcutaneous QHS  . insulin aspart  0-9 Units Subcutaneous TID WC  . isosorbide mononitrate  15 mg Oral Daily  . lisinopril  5 mg Oral Daily  . potassium chloride SA  20 mEq Oral Daily      PRN Medications: acetaminophen **OR** acetaminophen, albuterol, alum & mag hydroxide-simeth, guaiFENesin-dextromethorphan, hydrALAZINE, HYDROcodone-acetaminophen, ondansetron **OR** ondansetron (ZOFRAN) IV   Assessment and Plan:   1.Acute on Chronic Mixed CHF: She has diuresed 4.7 liters since admission IV lasix 60 mg Q 12 hours. Potassium is 3.6 this am. Will increase potassium to 40 mEq daily while we are aggressively diuresing her. Creatinine 1.39 worsening from initial admission level of 1.17 with C02 24.Marland Kitchen Uncertain baseline wt. She still appears decompensated this am. Will continue IV lasix. Will not add spironolactone at this time until renal status stabilizes.   Cannot rule out cardiorenal, as her EF is 20%-25%. She has restrictive pattern as well. Would continue coreg with titration up of dose as she compensates. Increase to 6.25 mg BID.   2. Hypertension: BP is not optimal for her cardiomyopathy and systolic function. She is on ACE lisinopril 5 mg Will not increase this yet as her renal function is worsening. Will add hydralazine 25 mg TID, with parameters   3. Medical and Dietary Non-Adherence: Recommend dietician for education. May need HHN or [facility] assistance when she goes home for ongoing self management.   Phill Myron. Lawrence NP AACC  07/03/2015,  8:07 AM   Patient examined chart reviewed Resting comfortably laying flat in bed with good sats Would be wary about starting aldactone with renal issues.  Agree with increasing K Basilar rales Good diuresis.    Jenkins Rouge

## 2015-07-03 NOTE — Progress Notes (Signed)
Subjective: The patient is more comfortable as morning with less dyspnea she was diuresed and is feeling some better. He isn't in acute on chronic CHF. She has a history of nonischemic cardiomyopathy with ejection fraction 30-35%Objective: Vital signs in last 24 hours: Temp:  [97.2 F (36.2 C)-98.4 F (36.9 C)] 98.3 F (36.8 C) (07/22 0539) Pulse Rate:  [65-85] 73 (07/22 0539) Resp:  [21-22] 21 (07/22 0539) BP: (120-151)/(62-111) 151/91 mmHg (07/22 0539) SpO2:  [94 %-100 %] 95 % (07/22 0539) Weight:  [68.811 kg (151 lb 11.2 oz)-70.489 kg (155 lb 6.4 oz)] 68.811 kg (151 lb 11.2 oz) (07/22 0539) Weight change: 2.449 kg (5 lb 6.4 oz) Last BM Date: 07/02/15  Intake/Output from previous day: 07/21 0701 - 07/22 0700 In: 600 [P.O.:600] Out: 5370 [Urine:5370] Intake/Output this shift: Total I/O In: 600 [P.O.:600] Out: 2300 [Urine:2300]  Physical Exam: Gen. appearance oriented  HEENT  Neck supple no JVD  Lungs clear to P&A  Heart regular rhythm  Abdomen no palpable organs or masses  Extremities 1+ edema       Recent Labs  07/02/15 0635  WBC 6.4  HGB 13.3  HCT 42.8  PLT 242   BMET  Recent Labs  07/02/15 0635  NA 145  K 4.2  CL 114*  CO2 22  GLUCOSE 182*  BUN 27*  CREATININE 1.17*  CALCIUM 9.5    Studies/Results: Dg Chest 2 View  07/02/2015   CLINICAL DATA:  Acute onset of shortness of breath. Initial encounter.  EXAM: CHEST  2 VIEW  COMPARISON:  Chest radiograph performed 04/28/2015  FINDINGS: The lungs are well-aerated. Mild bilateral central airspace opacities may reflect mild pulmonary edema or pneumonia. A small left pleural effusion is suspected. There is no evidence of pneumothorax.  The heart is mildly enlarged. No acute osseous abnormalities are seen. Clips are noted within the right upper quadrant, reflecting prior cholecystectomy.  IMPRESSION: Mild bilateral central airspace opacities may reflect mild pulmonary edema or pneumonia. Suspect small  left pleural effusion. Mild cardiomegaly.   Electronically Signed   By: Garald Balding M.D.   On: 07/02/2015 06:26    Medications:  . aspirin EC  81 mg Oral Daily  . carvedilol  3.125 mg Oral BID WC  . enoxaparin (LOVENOX) injection  40 mg Subcutaneous Q24H  . furosemide  60 mg Intravenous Q12H  . insulin aspart  0-5 Units Subcutaneous QHS  . insulin aspart  0-9 Units Subcutaneous TID WC  . isosorbide mononitrate  15 mg Oral Daily  . lisinopril  5 mg Oral Daily  . potassium chloride SA  20 mEq Oral Daily        Assessment/Plan: Acute on chronic CHF with pulmonary edema mild plan to continue diuresing 2. Hypertension fairly well controlled  3. CHF with ejection fraction 3295% diastolic dysfunction-continue diuresis continue monitor serum electrolytes    LOS: 1 day   Lesette Frary G 07/03/2015, 6:50 AM

## 2015-07-03 NOTE — Care Management Important Message (Signed)
Important Message  Patient Details  Name: [name] MRN: [id number] Date of Birth: 1936-10-24   Medicare Important Message Given:  Yes-second notification given    Sherald Barge, RN 07/03/2015, 8:57 AM

## 2015-07-03 NOTE — Care Management Note (Signed)
Case Management Note  Patient Details  Name: [name] MRN: [id number] Date of Birth: [birth year]  Expected Discharge Date:  07/04/15               Expected Discharge Plan:  [place]  In-House Referral:  NA  Discharge planning Services  CM Consult  Post Acute Care Choice:  Home Health Choice offered to:  Patient  DME Arranged:    DME Agency:     HH Arranged:  RN, Disease Management [place] Agency:  [place]  Status of Service:  Completed, signed off  Medicare Important Message Given:    Date Medicare IM Given:    Medicare IM give by:    Date Additional Medicare IM Given:    Additional Medicare Important Message give by:     If discussed at [place] of Stay Meetings, dates discussed:    Additional Comments: Pt is from home, lives with her grandson. Pt is independent at baseline with no DME or HH services prior to admission. Pt will need HH RN for disease management, pt has used [facility] in the past and would like for them to be referred again. Ernestina Columbia, of [facility] made aware of referral and will obtain pt info from chart. If pt discharges over weekend, discharging RN will fax order to [facility] and notify them pt being discharged. Pt aware [facility] has 48 hours to make first visit. No further CM needs.  Sherald Barge, RN 07/03/2015, 8:51 AM

## 2015-07-04 LAB — GLUCOSE, CAPILLARY
Glucose-Capillary: 117 mg/dL — ABNORMAL HIGH (ref 65–99)
Glucose-Capillary: 131 mg/dL — ABNORMAL HIGH (ref 65–99)
Glucose-Capillary: 156 mg/dL — ABNORMAL HIGH (ref 65–99)
Glucose-Capillary: 155 mg/dL — ABNORMAL HIGH (ref 65–99)

## 2015-07-04 NOTE — Progress Notes (Signed)
Subjective: The patient is alert and more comfortable this morning with less dyspnea seems to be feeling better. She has acute on chronic congestive heart failure and history of nonischemic cardiomyopathy with ejection fraction 30-35%. She continues to diurese  Objective: Vital signs in last 24 hours: Temp:  [97.5 F (36.4 C)-98.5 F (36.9 C)] 97.5 F (36.4 C) (07/23 0444) Pulse Rate:  [73-79] 73 (07/23 0444) Resp:  [20-21] 21 (07/23 0444) BP: (110-144)/(54-82) 144/82 mmHg (07/23 0444) SpO2:  [97 %-100 %] 100 % (07/23 0444) Weight:  [67.994 kg (149 lb 14.4 oz)] 67.994 kg (149 lb 14.4 oz) (07/23 0444) Weight change: -2.495 kg (-5 lb 8 oz) Last BM Date: 07/02/15  Intake/Output from previous day: 07/22 0701 - 07/23 0700 In: 720 [P.O.:720] Out: 3350 [Urine:3350] Intake/Output this shift:    Physical Exam: Gen. appearance the patient is oriented alert  H EENT negative  Neck supple no JVD or thyroid abnormalities  Lungs clear to P&A  Heart regular rhythm  Abdomen no palpable organs or masses  Extremities free of edema   Recent Labs  07/02/15 0635 07/03/15 0652  WBC 6.4 6.3  HGB 13.3 13.0  HCT 42.8 40.6  PLT 242 230   BMET  Recent Labs  07/02/15 0635 07/03/15 0652  NA 145 141  K 4.2 3.6  CL 114* 106  CO2 22 24  GLUCOSE 182* 216*  BUN 27* 29*  CREATININE 1.17* 1.39*  CALCIUM 9.5 9.2    Studies/Results: No results found.  Medications:  . aspirin EC  81 mg Oral Daily  . carvedilol  6.25 mg Oral BID WC  . enoxaparin (LOVENOX) injection  40 mg Subcutaneous Q24H  . furosemide  60 mg Intravenous Q12H  . insulin aspart  0-5 Units Subcutaneous QHS  . insulin aspart  0-9 Units Subcutaneous TID WC  . isosorbide mononitrate  15 mg Oral Daily  . lisinopril  5 mg Oral Daily  . potassium chloride SA  40 mEq Oral Daily        Assessment/Plan: 1. Acute on chronic congestive heart failure with pulmonary edema mild-plan to continue direct continue to  monitor  2. Hypertension fairly well controlled  Diastolic congestive heart failure with 30-35% ejection fraction-continue to monitor electrolytes-continue diuresis   LOS: 2 days   Domenica Weightman G 07/04/2015, 8:33 AM

## 2015-07-05 LAB — GLUCOSE, CAPILLARY
Glucose-Capillary: 128 mg/dL — ABNORMAL HIGH (ref 65–99)
Glucose-Capillary: 111 mg/dL — ABNORMAL HIGH (ref 65–99)
Glucose-Capillary: 120 mg/dL — ABNORMAL HIGH (ref 65–99)
Glucose-Capillary: 142 mg/dL — ABNORMAL HIGH (ref 65–99)

## 2015-07-05 NOTE — Progress Notes (Signed)
Per central telemetry pt experienced 2.3 second pause. Pt denies chest discomfort or chest pain. Pt's vital signs WNL. MD notified and made aware. No further instructions at this time. Will continue to monitor patient throughout the shift.

## 2015-07-05 NOTE — Progress Notes (Signed)
Patient in no significant respiratory distress this morning no chest pain no significant orthopnea [name] KNL:[id number] DOB: 02/15/1936 DOA: 07/02/2015 PCP: Katie Hampshire, MD             Physical Exam: Blood pressure 138/74, pulse 94, temperature 98.6 F (37 C), temperature source Oral, resp. rate 20, height 5\' 2"  (1.575 m), weight 150 lb 1.6 oz (68.085 kg), SpO2 95 %. collection or JVD or bruits no thyromegaly lungs show diminished breath sounds in the bases prolonged expiratory phase no rales wheeze rhonchi appreciable heart regular rhythm no S3-S4 no heaves thrills rubs   Investigations:  No results found for this or any previous visit (from the past 240 hour(s)).   Basic Metabolic Panel:  Recent Labs  07/03/15 0652  NA 141  K 3.6  CL 106  CO2 24  GLUCOSE 216*  BUN 29*  CREATININE 1.39*  CALCIUM 9.2   Liver Function Tests: No results for input(s): AST, ALT, ALKPHOS, BILITOT, PROT, ALBUMIN in the last 72 hours.   CBC:  Recent Labs  07/03/15 0652  WBC 6.3  HGB 13.0  HCT 40.6  MCV 84.1  PLT 230    No results found.    Medications:  Impression:  Principal Problem:   Acute on chronic combined systolic and diastolic CHF (congestive heart failure) Active Problems:   Essential hypertension   CORONARY ATHEROSCLEROSIS NATIVE CORONARY ARTERY   DM2 (diabetes mellitus, type 2)   Elevated troponin I level   Right bundle branch block   Renal insufficiency     Plan continue diuresis monitor renal function and electrolytes   Consultants: Cardiology   Procedures   Antibiotics:                   Code Status: Full   Family Communication:    Disposition Plan see plan above  Time spent: 30 minutes   LOS: 3 days   [name] M   07/05/2015, 7:50 AM

## 2015-07-06 LAB — BASIC METABOLIC PANEL
Anion gap: 12 (ref 5–15)
BUN: 45 mg/dL — ABNORMAL HIGH (ref 6–20)
CO2: 28 mmol/L (ref 22–32)
Calcium: 9.8 mg/dL (ref 8.9–10.3)
Chloride: 97 mmol/L — ABNORMAL LOW (ref 101–111)
Creatinine, Ser: 1.48 mg/dL — ABNORMAL HIGH (ref 0.44–1.00)
GFR calc Af Amer: 38 mL/min — ABNORMAL LOW (ref >60)
GFR calc non Af Amer: 33 mL/min — ABNORMAL LOW (ref >60)
Glucose, Bld: 142 mg/dL — ABNORMAL HIGH (ref 65–99)
Potassium: 4 mmol/L (ref 3.5–5.1)
Sodium: 137 mmol/L (ref 135–145)

## 2015-07-06 LAB — GLUCOSE, CAPILLARY
Glucose-Capillary: 145 mg/dL — ABNORMAL HIGH (ref 65–99)
Glucose-Capillary: 146 mg/dL — ABNORMAL HIGH (ref 65–99)

## 2015-07-06 MED ORDER — CARVEDILOL 6.25 MG PO TABS: 6.2500 mg | ORAL_TABLET | Freq: Two times a day (BID) | ORAL | Status: DC

## 2015-07-06 MED ORDER — ASPIRIN 81 MG PO TBEC: 81.0000 mg | DELAYED_RELEASE_TABLET | Freq: Every day | ORAL | Status: DC

## 2015-07-06 MED ORDER — ISOSORBIDE MONONITRATE ER 30 MG PO TB24: 15.0000 mg | ORAL_TABLET | Freq: Every day | ORAL | Status: DC

## 2015-07-06 MED ORDER — LISINOPRIL 5 MG PO TABS: 5.0000 mg | ORAL_TABLET | Freq: Every day | ORAL | Status: DC

## 2015-07-06 MED ORDER — ENOXAPARIN SODIUM 30 MG/0.3ML ~~LOC~~ SOLN
30.0000 mg | SUBCUTANEOUS | Status: DC
Start: 2015-07-07 — End: 2015-07-06

## 2015-07-06 NOTE — Care Management Important Message (Signed)
Important Message  Patient Details  Name: [name] MRN: [id number] Date of Birth: 1936/04/09   Medicare Important Message Given:  Yes-third notification given    Sherald Barge, RN 07/06/2015, 11:15 AM

## 2015-07-06 NOTE — Progress Notes (Signed)
0905 Patient called daughter to pick her up and take her home, awaiting daughter's arrival at this time.

## 2015-07-06 NOTE — Discharge Summary (Signed)
Physician Discharge Summary  [name] VCB:[id number] DOB: 26-Dec-1935 DOA: 07/02/2015  PCP: Lanette Hampshire, MD  Admit date: 07/02/2015 Discharge date: 07/06/2015   Follow-up Information    Follow up with [facility].   Contact information:   [address] [place] [address] [phone]       Discharge Diagnoses:  1. Acute on chronic systolic and diastolic congestive heart failure with cardiomyopathy 2. Diabetes mellitus type 2 3. Essential hypertension 4. Coronary artery disease 5. Renal insufficiency  Dischar discharge condition stable Disposition: Home  Diet recommendation: 2 g sodium diet 2000-calorie ADA diet  Filed Weights   07/04/15 0444 07/05/15 0402 07/06/15 0530  Weight: 67.994 kg (149 lb 14.4 oz) 68.085 kg (150 lb 1.6 oz) 67.178 kg (148 lb 1.6 oz)    History of present illness:  The patient was admitted through the ED with increasing dyspnea and cough. Patient has combined systolic and diastolic congestive heart failure with ejection fraction 30-35% history diabetes hypertension and asthma. She had increased swelling in her legs prior to admission she had a BNP of 2000 948 troponin 0.08 she was admitted for further inhaler relation and management. Chest x-ray revealed bilateral airspace opacities reflecting pulmonary edema  Hospital Course:  Patient was admitted to [place] bed positive physical findings on admission the patient was comfortable heart regular rhythm lungs occasional rhonchus heard over lower lung field extremities 2+ edema. The patient was started on IV Lasix and began to diurese. She was seen by cardiology and was noted that she had a prior 2-D echocardiogram which showed ejection fraction of 20-25% with diffuse hypokinesis and grade 2 diastolic dysfunction. The patient diuresed more than 4.7 L since admission and began to feel better Coreg was added to regimen. She is continued on lisinopril. She continued on  medications listed below and continued to improve to the point that we felt she could be discharged.   Discharge Instructions The patient is to continue medications listed below. She was instructed to continue low-sodium diet. She is to return to primary care physician's office in approximately one week    Medication List    STOP taking these medications        levofloxacin 500 MG tablet  Commonly known as:  LEVAQUIN     predniSONE 20 MG tablet  Commonly known as:  DELTASONE      TAKE these medications        albuterol 108 (90 BASE) MCG/ACT inhaler  Commonly known as:  PROVENTIL HFA;VENTOLIN HFA  Inhale 2 puffs into the lungs every 4 (four) hours as needed for wheezing or shortness of breath.     aspirin 81 MG EC tablet  Take 1 tablet (81 mg total) by mouth daily.     bisoprolol 5 MG tablet  Commonly known as:  ZEBETA  Take 5 mg by mouth every morning.     carvedilol 6.25 MG tablet  Commonly known as:  COREG  Take 1 tablet (6.25 mg total) by mouth 2 (two) times daily with a meal.     furosemide 20 MG tablet  Commonly known as:  LASIX  Take 20 mg by mouth every morning.     isosorbide mononitrate 30 MG 24 hr tablet  Commonly known as:  IMDUR  Take 0.5 tablets (15 mg total) by mouth daily.     lisinopril 5 MG tablet  Commonly known as:  PRINIVIL,ZESTRIL  Take 1 tablet (5 mg total) by mouth daily.  metFORMIN 500 MG tablet  Commonly known as:  GLUCOPHAGE  Take by mouth 2 (two) times daily.     potassium chloride SA 20 MEQ tablet  Commonly known as:  K-DUR,KLOR-CON  Take 1 tablet (20 mEq total) by mouth daily.       No Known Allergies  The results of significant diagnostics from this hospitalization (including imaging, microbiology, ancillary and laboratory) are listed below for reference.    Significant Diagnostic Studies: Dg Chest 2 View  07/02/2015   CLINICAL DATA:  Acute onset of shortness of breath. Initial encounter.  EXAM: CHEST  2 VIEW  COMPARISON:   Chest radiograph performed 04/28/2015  FINDINGS: The lungs are well-aerated. Mild bilateral central airspace opacities may reflect mild pulmonary edema or pneumonia. A small left pleural effusion is suspected. There is no evidence of pneumothorax.  The heart is mildly enlarged. No acute osseous abnormalities are seen. Clips are noted within the right upper quadrant, reflecting prior cholecystectomy.  IMPRESSION: Mild bilateral central airspace opacities may reflect mild pulmonary edema or pneumonia. Suspect small left pleural effusion. Mild cardiomegaly.   Electronically Signed   By: Garald Balding M.D.   On: 07/02/2015 06:26    Microbiology: No results found for this or any previous visit (from the past 240 hour(s)).   Labs: Basic Metabolic Panel:  Recent Labs Lab 07/02/15 0635 07/03/15 0652  NA 145 141  K 4.2 3.6  CL 114* 106  CO2 22 24  GLUCOSE 182* 216*  BUN 27* 29*  CREATININE 1.17* 1.39*  CALCIUM 9.5 9.2   Liver Function Tests:  Recent Labs Lab 07/02/15 0635  AST 56*  ALT 47  ALKPHOS 262*  BILITOT 0.8  PROT 7.0  ALBUMIN 3.6   No results for input(s): LIPASE, AMYLASE in the last 168 hours. No results for input(s): AMMONIA in the last 168 hours. CBC:  Recent Labs Lab 07/02/15 0635 07/03/15 0652  WBC 6.4 6.3  NEUTROABS 4.2  --   HGB 13.3 13.0  HCT 42.8 40.6  MCV 85.1 84.1  PLT 242 230   Cardiac Enzymes:  Recent Labs Lab 07/02/15 0635 07/02/15 0939 07/02/15 1525 07/02/15 2039  TROPONINI 0.08* 0.07* 0.06* 0.06*   BNP: BNP (last 3 results)  Recent Labs  03/30/15 1941 04/25/15 1841 07/02/15 0635  BNP 1841.0* 1091.0* 2948.0*    ProBNP (last 3 results)  Recent Labs  07/08/14 1920  PROBNP 3524.0*    CBG:  Recent Labs Lab 07/04/15 2047 07/05/15 0716 07/05/15 1125 07/05/15 1631 07/05/15 2047  GLUCAP 155* 128* 111* 120* 142*    Principal Problem:   Acute on chronic combined systolic and diastolic CHF (congestive heart  failure) Active Problems:   Essential hypertension   CORONARY ATHEROSCLEROSIS NATIVE CORONARY ARTERY   DM2 (diabetes mellitus, type 2)   Elevated troponin I level   Right bundle branch block   Renal insufficiency   Time coordinating discharge: 30 minutes  Signed:  Marjean Donna, MD 07/06/2015, 6:23 AM

## 2015-07-06 NOTE — Progress Notes (Signed)
0830 Foley catheter removed. Catheter intact, output 700cc, waiting on void at this time.

## 2015-07-06 NOTE — Progress Notes (Signed)
1206 Patient's daughter arrived to pick up patient. D/C instructions, paperwork and hard Rxs given to patient and patient's daughter. IV catheter removed from LEFT hand/thumb region, catheter intact, no s/s of infection noted, patient tolerated well w/no c/o pain or discomfort noted. Tele box and wires removed from patient, central tele notified. Daughter assisted patient with changing clothes. Staff assisted patient to vehicle via w/c.

## 2015-08-16 ENCOUNTER — Emergency Department (HOSPITAL_COMMUNITY)
Admission: EM | Admit: 2015-08-16 | Discharge: 2015-08-16 | Disposition: A | Discharge status: Home / Self Care | Payer: Medicare Other | Attending: Emergency Medicine | Admitting: Emergency Medicine

## 2015-08-16 ENCOUNTER — Encounter (HOSPITAL_COMMUNITY): Payer: Self-pay | Admitting: Cardiology

## 2015-08-16 ENCOUNTER — Emergency Department (HOSPITAL_COMMUNITY): Payer: Medicare Other

## 2015-08-16 DIAGNOSIS — Z8639 Personal history of other endocrine, nutritional and metabolic disease: Secondary | ICD-10-CM | POA: Insufficient documentation

## 2015-08-16 DIAGNOSIS — Z8739 Personal history of other diseases of the musculoskeletal system and connective tissue: Secondary | ICD-10-CM | POA: Insufficient documentation

## 2015-08-16 DIAGNOSIS — E119 Type 2 diabetes mellitus without complications: Secondary | ICD-10-CM | POA: Diagnosis not present

## 2015-08-16 DIAGNOSIS — J45909 Unspecified asthma, uncomplicated: Secondary | ICD-10-CM | POA: Diagnosis not present

## 2015-08-16 DIAGNOSIS — Z79899 Other long term (current) drug therapy: Secondary | ICD-10-CM | POA: Diagnosis not present

## 2015-08-16 DIAGNOSIS — Z7982 Long term (current) use of aspirin: Secondary | ICD-10-CM | POA: Diagnosis not present

## 2015-08-16 DIAGNOSIS — R42 Dizziness and giddiness: Secondary | ICD-10-CM | POA: Insufficient documentation

## 2015-08-16 DIAGNOSIS — Z8673 Personal history of transient ischemic attack (TIA), and cerebral infarction without residual deficits: Secondary | ICD-10-CM | POA: Insufficient documentation

## 2015-08-16 DIAGNOSIS — I10 Essential (primary) hypertension: Secondary | ICD-10-CM | POA: Diagnosis not present

## 2015-08-16 DIAGNOSIS — I5043 Acute on chronic combined systolic (congestive) and diastolic (congestive) heart failure: Secondary | ICD-10-CM | POA: Insufficient documentation

## 2015-08-16 DIAGNOSIS — I2510 Atherosclerotic heart disease of native coronary artery without angina pectoris: Secondary | ICD-10-CM | POA: Diagnosis not present

## 2015-08-16 LAB — CBC WITH DIFFERENTIAL/PLATELET
Basophils Absolute: 0 10*3/uL (ref 0.0–0.1)
Basophils Relative: 1 % (ref 0–1)
Eosinophils Absolute: 0.1 10*3/uL (ref 0.0–0.7)
Eosinophils Relative: 1 % (ref 0–5)
HCT: 41.2 % (ref 36.0–46.0)
Hemoglobin: 13.3 g/dL (ref 12.0–15.0)
Lymphocytes Relative: 28 % (ref 12–46)
Lymphs Abs: 1.4 10*3/uL (ref 0.7–4.0)
MCH: 27.1 pg (ref 26.0–34.0)
MCHC: 32.3 g/dL (ref 30.0–36.0)
MCV: 84.1 fL (ref 78.0–100.0)
Monocytes Absolute: 0.6 10*3/uL (ref 0.1–1.0)
Monocytes Relative: 11 % (ref 3–12)
Neutro Abs: 3.1 10*3/uL (ref 1.7–7.7)
Neutrophils Relative %: 59 % (ref 43–77)
Platelets: 223 10*3/uL (ref 150–400)
RBC: 4.9 MIL/uL (ref 3.87–5.11)
RDW: 15.6 % — ABNORMAL HIGH (ref 11.5–15.5)
WBC: 5.2 10*3/uL (ref 4.0–10.5)

## 2015-08-16 LAB — BASIC METABOLIC PANEL
Anion gap: 8 (ref 5–15)
BUN: 25 mg/dL — ABNORMAL HIGH (ref 6–20)
CO2: 24 mmol/L (ref 22–32)
Calcium: 9.5 mg/dL (ref 8.9–10.3)
Chloride: 109 mmol/L (ref 101–111)
Creatinine, Ser: 1.17 mg/dL — ABNORMAL HIGH (ref 0.44–1.00)
GFR calc Af Amer: 51 mL/min — ABNORMAL LOW (ref >60)
GFR calc non Af Amer: 44 mL/min — ABNORMAL LOW (ref >60)
Glucose, Bld: 152 mg/dL — ABNORMAL HIGH (ref 65–99)
Potassium: 4 mmol/L (ref 3.5–5.1)
Sodium: 141 mmol/L (ref 135–145)

## 2015-08-16 MED ORDER — MECLIZINE HCL 50 MG PO TABS: 25.0000 mg | ORAL_TABLET | Freq: Three times a day (TID) | ORAL | Status: DC | PRN

## 2015-08-16 MED ORDER — SODIUM CHLORIDE 0.9 % IV BOLUS (SEPSIS): 500.0000 mL | Freq: Once | INTRAVENOUS | Status: DC

## 2015-08-16 MED ORDER — MECLIZINE HCL 12.5 MG PO TABS
50.0000 mg | ORAL_TABLET | Freq: Once | ORAL | Status: AC
  Administered 2015-08-16: 50 mg via ORAL
  Filled 2015-08-16: qty 4

## 2015-08-16 MED ORDER — SODIUM CHLORIDE 0.9 % IV BOLUS (SEPSIS)
250.0000 mL | Freq: Once | INTRAVENOUS | Status: AC
Start: 2015-08-16 — End: 2015-08-16
  Administered 2015-08-16: 250 mL via INTRAVENOUS

## 2015-08-16 NOTE — ED Notes (Signed)
C/o dizziness when she lays down.  States started today.

## 2015-08-16 NOTE — Discharge Instructions (Signed)

## 2015-08-16 NOTE — ED Provider Notes (Addendum)
CSN: [id number]     Arrival date & time 08/16/15  1109 History   First MD Initiated Contact with Patient 08/16/15 1142     Chief Complaint  Patient presents with  . Dizziness     (Consider location/radiation/quality/duration/timing/severity/associated sxs/prior Treatment) HPI  79 year old female who presents with dizziness. History of nonischemic cardiomyopathy with EF of 20-25%, prior CVA, hypertension, type 2 diabetes, and CAD. Reports pain in her usual state of health, and today after attempting to lay back in the head had the sensation of dizziness. She describes sensation as "being in a fish bowl and swimming.". Does also endorse a sensation that the room may be spinning. This is associated with nausea, but she has not had any vomiting. She denies any vision changes, speech changes, focal weakness or numbness has noted some mild gait instability, but reports she continues to be able to walk. Reports mild cold-like symptoms of cough, congestion, but denies chest pain or difficulty breathing. Denies falls or trauma.  Past Medical History  Diagnosis Date  . Nonischemic cardiomyopathy     LVEF 20-25%>>30-35% 05/2014  . Asthma   . Cerebrovascular disease     TIA and stroke   . Essential hypertension, benign   . Coronary atherosclerosis of native coronary artery     Nonobstructive at cath 2005  . Hypothyroidism   . Carotid artery disease   . Gout   . Renal artery stenosis     Bilateral  . Bronchitis   . Diastolic dysfunction 02/75    Grade 3  . DM2 (diabetes mellitus, type 2) 04/25/2015  . Acute on chronic combined systolic and diastolic CHF (congestive heart failure) 06/06/2014  . Influenza 12/10/2012  . Acute respiratory failure with hypoxia 04/25/2015   Past Surgical History  Procedure Laterality Date  . Abdominal hysterectomy     Family History  Problem Relation Age of Onset  . Asthma Son    Social History  Substance Use Topics  . Smoking status: Never Smoker   .  Smokeless tobacco: Never Used  . Alcohol Use: No   OB History    Gravida Para Term Preterm AB TAB SAB Ectopic Multiple Living   10 10 10             Review of Systems 10/14 systems reviewed and are negative other than those stated in the HPI    Allergies  Review of patient's allergies indicates no known allergies.  Home Medications   Prior to Admission medications   Medication Sig Start Date End Date Taking? Authorizing Provider  albuterol (PROVENTIL HFA;VENTOLIN HFA) 108 (90 BASE) MCG/ACT inhaler Inhale 2 puffs into the lungs every 4 (four) hours as needed for wheezing or shortness of breath.   Yes Historical Provider, MD  aspirin EC 81 MG EC tablet Take 1 tablet (81 mg total) by mouth daily. 07/06/15  Yes Angus McInnis, MD  bisoprolol (ZEBETA) 5 MG tablet Take 5 mg by mouth every morning.   Yes Historical Provider, MD  carvedilol (COREG) 6.25 MG tablet Take 1 tablet (6.25 mg total) by mouth 2 (two) times daily with a meal. 07/06/15  Yes Angus McInnis, MD  furosemide (LASIX) 20 MG tablet Take 20 mg by mouth every morning.   Yes Historical Provider, MD  isosorbide mononitrate (IMDUR) 30 MG 24 hr tablet Take 0.5 tablets (15 mg total) by mouth daily. 07/06/15  Yes Angus McInnis, MD  lisinopril (PRINIVIL,ZESTRIL) 5 MG tablet Take 1 tablet (5 mg total) by mouth daily. 07/06/15  Yes  Marjean Donna, MD  metFORMIN (GLUCOPHAGE) 500 MG tablet Take 500 mg by mouth 2 (two) times daily.    Yes Historical Provider, MD  potassium chloride SA (K-DUR,KLOR-CON) 20 MEQ tablet Take 1 tablet (20 mEq total) by mouth daily. 06/11/14  Yes Marjean Donna, MD  meclizine (ANTIVERT) 50 MG tablet Take 0.5 tablets (25 mg total) by mouth 3 (three) times daily as needed for dizziness. 08/16/15   Forde Dandy, MD   BP 152/99 mmHg  Pulse 79  Temp(Src) 98.7 F (37.1 C) (Oral)  Resp 21  Ht 5\' 3"  (1.6 m)  Wt 150 lb (68.04 kg)  BMI 26.58 kg/m2  SpO2 99% Physical Exam Physical Exam  Nursing note and vitals  reviewed. Constitutional: Well developed, well nourished, non-toxic, and in no acute distress Head: Normocephalic and atraumatic.  Mouth/Throat: Oropharynx is clear and moist.  Neck: Normal range of motion. Neck supple.  Cardiovascular: Normal rate and regular rhythm.  No edema. Pulmonary/Chest: Effort normal and breath sounds normal.  Abdominal: Soft. There is no tenderness. There is no rebound and no guarding.  Musculoskeletal: Normal range of motion.  Skin: Skin is warm and dry.  Psychiatric: Cooperative Neurological:  Alert, oriented to person, place, time, and situation. Memory grossly in tact. Fluent speech. No dysarthria or aphasia.  Cranial nerves: VF are full. Pupils are symmetric, and reactive to light. EOMI without nystagmus. No gaze deviation. Facial muscles symmetric with activation. Sensation to light touch over face in tact bilaterally. Hearing grossly in tact. Palate elevates symmetrically. Head turn and shoulder shrug are intact. Tongue midline.   Muscle bulk and tone normal. No pronator drift. Moves all extremities symmetrically. Sensation to light touch is in tact throughout in bilateral upper and lower extremities. Coordination reveals no dysmetria with finger to nose. Gait is narrow-based and steady.   ED Course  Procedures (including critical care time) Labs Review Labs Reviewed  CBC WITH DIFFERENTIAL/PLATELET - Abnormal; Notable for the following:    RDW 15.6 (*)    All other components within normal limits  BASIC METABOLIC PANEL - Abnormal; Notable for the following:    Glucose, Bld 152 (*)    BUN 25 (*)    Creatinine, Ser 1.17 (*)    GFR calc non Af Amer 44 (*)    GFR calc Af Amer 51 (*)    All other components within normal limits    Imaging Review Dg Chest 2 View  08/16/2015   CLINICAL DATA:  79 year old female with nausea, dizziness and hypoxia  EXAM: CHEST  2 VIEW  COMPARISON:  Prior chest x-ray 07/02/2015  FINDINGS: Stable mild cardiomegaly.  Atherosclerotic calcifications again noted in the transverse aorta. Similar appearance of diffuse bilateral interstitial prominence which may be chronic or represent chronic interstitial edema. There are small bilateral pleural effusions. Central bronchitic changes are similar compared to prior. No acute osseous abnormality. No new focal airspace consolidation, or pneumothorax.  IMPRESSION: 1. Stable appearance of the chest with diffuse bilateral interstitial prominence which may reflect chronic mild interstitial pulmonary edema or chronic interstitial pulmonary parenchymal changes. 2. Stable cardiomegaly. 3. Small bilateral layering effusions.   Electronically Signed   By: Jacqulynn Cadet M.D.   On: 08/16/2015 14:44   I have personally reviewed and evaluated these images and lab results as part of my medical decision-making.   EKG Interpretation   Date/Time:  Sunday August 16 2015 11:19:57 EDT Ventricular Rate:  94 PR Interval:  169 QRS Duration: 144 QT Interval:  430 QTC  Calculation: 538 R Axis:   91 Text Interpretation:  Sinus rhythm Consider left atrial enlargement Right  bundle branch block No significant change since last tracing Confirmed by  Kevork Joyce MD, Ailynn Gow [phone]) on 08/16/2015 12:19:29 PM      MDM   Final diagnoses:  Vertigo    79 year old female who presents with dizziness, felt to be vertigo in nature. She is nontoxic and in no acute distress. Vital signs are overall non-concerning. She is neurologically intact on exam. No evidence of dysmetria with coordination testing, and able to ambulate steadily. Presentation seems most consistent with peripheral etiology of her vertigo. No concerning features that would be suggestive of central vertigo, including  bidirectional nystagmus, abnormal test a few, or neurological deficits. She is given meclizine for symptoms, and reports resolution of her symptoms. She is felt to be appropriate for discharge home. On chart review, she has  documented a pulse ox of 88-91% on room air. On my reevaluation of this patient, she has persistent saturations at 96-99% on room air. She has not had any dyspnea on exertion, edema, PND/orthopnea or other symptoms of heart failure or other acute processes. CXR appears stable.  She is also ambulated here in the emergency department with normal oxygenation and no dyspnea on exertion. Discussed with bedside nurse, who does not recall seeing hypoxia on her evaluation of patient. Given steady gait and resolution of symptoms, she is felt appropriate for discharge home. Strict return and follow-up instructions are reviewed. She expressed understanding of all discharge instructions and felt comfortable to plan of care.  Forde Dandy, MD 08/16/15 [place] Sriansh Farra, MD 08/16/15 [phone]

## 2015-08-16 NOTE — ED Notes (Signed)
Ambulated Pt her Bp was 120/50, pulse was 90 and her spo2 was between 90 and 94.

## 2015-09-01 ENCOUNTER — Emergency Department (HOSPITAL_COMMUNITY): Payer: Medicare Other

## 2015-09-01 ENCOUNTER — Inpatient Hospital Stay (HOSPITAL_COMMUNITY)
Admission: EM | Admit: 2015-09-01 | Discharge: 2015-09-04 | DRG: 292 | Disposition: A | Discharge status: Home Health | Payer: Medicare Other | Attending: Family Medicine | Admitting: Family Medicine

## 2015-09-01 ENCOUNTER — Encounter (HOSPITAL_COMMUNITY): Payer: Self-pay | Admitting: Emergency Medicine

## 2015-09-01 DIAGNOSIS — I509 Heart failure, unspecified: Secondary | ICD-10-CM

## 2015-09-01 DIAGNOSIS — I4510 Unspecified right bundle-branch block: Secondary | ICD-10-CM | POA: Diagnosis present

## 2015-09-01 DIAGNOSIS — E119 Type 2 diabetes mellitus without complications: Secondary | ICD-10-CM | POA: Diagnosis present

## 2015-09-01 DIAGNOSIS — J45901 Unspecified asthma with (acute) exacerbation: Secondary | ICD-10-CM | POA: Diagnosis present

## 2015-09-01 DIAGNOSIS — E039 Hypothyroidism, unspecified: Secondary | ICD-10-CM | POA: Diagnosis present

## 2015-09-01 DIAGNOSIS — I429 Cardiomyopathy, unspecified: Secondary | ICD-10-CM | POA: Diagnosis present

## 2015-09-01 DIAGNOSIS — I10 Essential (primary) hypertension: Secondary | ICD-10-CM | POA: Diagnosis present

## 2015-09-01 DIAGNOSIS — Z8673 Personal history of transient ischemic attack (TIA), and cerebral infarction without residual deficits: Secondary | ICD-10-CM

## 2015-09-01 DIAGNOSIS — I5043 Acute on chronic combined systolic (congestive) and diastolic (congestive) heart failure: Secondary | ICD-10-CM | POA: Diagnosis present

## 2015-09-01 DIAGNOSIS — E1121 Type 2 diabetes mellitus with diabetic nephropathy: Secondary | ICD-10-CM

## 2015-09-01 DIAGNOSIS — I2510 Atherosclerotic heart disease of native coronary artery without angina pectoris: Secondary | ICD-10-CM | POA: Diagnosis present

## 2015-09-01 DIAGNOSIS — E876 Hypokalemia: Secondary | ICD-10-CM | POA: Diagnosis present

## 2015-09-01 DIAGNOSIS — I340 Nonrheumatic mitral (valve) insufficiency: Secondary | ICD-10-CM | POA: Diagnosis present

## 2015-09-01 DIAGNOSIS — R0902 Hypoxemia: Secondary | ICD-10-CM | POA: Diagnosis present

## 2015-09-01 DIAGNOSIS — Z825 Family history of asthma and other chronic lower respiratory diseases: Secondary | ICD-10-CM

## 2015-09-01 DIAGNOSIS — R0602 Shortness of breath: Secondary | ICD-10-CM | POA: Diagnosis not present

## 2015-09-01 LAB — COMPREHENSIVE METABOLIC PANEL
ALT: 26 U/L (ref 14–54)
AST: 34 U/L (ref 15–41)
Albumin: 4 g/dL (ref 3.5–5.0)
Alkaline Phosphatase: 181 U/L — ABNORMAL HIGH (ref 38–126)
Anion gap: 7 (ref 5–15)
BUN: 24 mg/dL — ABNORMAL HIGH (ref 6–20)
CO2: 22 mmol/L (ref 22–32)
Calcium: 9.7 mg/dL (ref 8.9–10.3)
Chloride: 114 mmol/L — ABNORMAL HIGH (ref 101–111)
Creatinine, Ser: 1.19 mg/dL — ABNORMAL HIGH (ref 0.44–1.00)
GFR calc Af Amer: 50 mL/min — ABNORMAL LOW (ref >60)
GFR calc non Af Amer: 43 mL/min — ABNORMAL LOW (ref >60)
Glucose, Bld: 156 mg/dL — ABNORMAL HIGH (ref 65–99)
Potassium: 3.7 mmol/L (ref 3.5–5.1)
Sodium: 143 mmol/L (ref 135–145)
Total Bilirubin: 1.4 mg/dL — ABNORMAL HIGH (ref 0.3–1.2)
Total Protein: 7.6 g/dL (ref 6.5–8.1)

## 2015-09-01 LAB — CBC
HCT: 41.7 % (ref 36.0–46.0)
Hemoglobin: 13.5 g/dL (ref 12.0–15.0)
MCH: 27.5 pg (ref 26.0–34.0)
MCHC: 32.4 g/dL (ref 30.0–36.0)
MCV: 84.9 fL (ref 78.0–100.0)
Platelets: 221 10*3/uL (ref 150–400)
RBC: 4.91 MIL/uL (ref 3.87–5.11)
RDW: 15.8 % — ABNORMAL HIGH (ref 11.5–15.5)
WBC: 7.1 10*3/uL (ref 4.0–10.5)

## 2015-09-01 LAB — TROPONIN I: Troponin I: 0.04 ng/mL — ABNORMAL HIGH (ref <0.031)

## 2015-09-01 LAB — BRAIN NATRIURETIC PEPTIDE: B Natriuretic Peptide: 2425 pg/mL — ABNORMAL HIGH (ref 0.0–100.0)

## 2015-09-01 MED ORDER — FUROSEMIDE 10 MG/ML IJ SOLN
40.0000 mg | Freq: Once | INTRAMUSCULAR | Status: AC
  Administered 2015-09-01: 40 mg via INTRAVENOUS
  Filled 2015-09-01: qty 4

## 2015-09-01 NOTE — ED Notes (Signed)
Pt reports took fluid pill around noon and reports abdominal pain ever since. Pt denies any v/d. nad noted.

## 2015-09-01 NOTE — H&P (Signed)
[facility] History and Physical  [name] UTM:[id number] DOB: [birth year]    PCP:   Lanette Hampshire, MD   Chief Complaint: SOB, abdominal discomfort, and bilateral leg swelling.   HPI: [name] is a 79 y.o. female with a history of combined systolic and diastolic heart failure; last recorded echo was in July 2016, where she had EF 20 percent, severe mitral regurgitation, and grade 3 diastolic dysfunction; history of diabetes mellitus, hypertension, and asthma, presented to the ER with increase leg swelling, increase DOE, and abdominal discomfort.  She has had no fever, chills, chest pain, nausea, vomiting, or diaphoresis.  Evalatuion in the ER included a CXR showing bilateral interstitial infiltrate, BNP of 2425, Hb of 13.5 grams per dL, and Cr of 1.19 with K of 3.7.  She was given IV Lasix, and felt better, but when she ambulates, she still get SOB and desaturation of her POx.  Her EKG showed no ischemic changes.   Hospitalist was asked to admit her for further diuresis.    Rewiew of Systems:  Constitutional: Negative for malaise, fever and chills. No significant weight loss or weight gain Eyes: Negative for eye pain, redness and discharge, diplopia, visual changes, or flashes of light. ENMT: Negative for ear pain, hoarseness, nasal congestion, sinus pressure and sore throat. No headaches; tinnitus, drooling, or problem swallowing. Cardiovascular: Negative for chest pain, palpitations, diaphoresis, Respiratory: Negative for cough, hemoptysis, wheezing and stridor. No pleuritic chestpain. Gastrointestinal: Negative for nausea, vomiting, diarrhea, constipation, abdominal pain, melena, blood in stool, hematemesis, jaundice and rectal bleeding.    Genitourinary: Negative for frequency, dysuria, incontinence,flank pain and hematuria; Musculoskeletal: Negative for back pain and neck pain. Negative for trauma.;  Skin: . Negative for pruritus, rash, abrasions, bruising and skin  lesion.; ulcerations Neuro: Negative for headache, lightheadedness and neck stiffness. Negative for weakness, altered level of consciousness , altered mental status, extremity weakness, burning feet, involuntary movement, seizure and syncope.  Psych: negative for anxiety, depression, insomnia, tearfulness, panic attacks, hallucinations, paranoia, suicidal or homicidal ideation    Past Medical History  Diagnosis Date  . Nonischemic cardiomyopathy     LVEF 20-25%>>30-35% 05/2014  . Asthma   . Cerebrovascular disease     TIA and stroke   . Essential hypertension, benign   . Coronary atherosclerosis of native coronary artery     Nonobstructive at cath 2005  . Hypothyroidism   . Carotid artery disease   . Gout   . Renal artery stenosis     Bilateral  . Bronchitis   . Diastolic dysfunction 04/6811    Grade 3  . DM2 (diabetes mellitus, type 2) 04/25/2015  . Acute on chronic combined systolic and diastolic CHF (congestive heart failure) 06/06/2014  . Influenza 12/10/2012  . Acute respiratory failure with hypoxia 04/25/2015    Past Surgical History  Procedure Laterality Date  . Abdominal hysterectomy      Medications:  HOME MEDS: Prior to Admission medications   Medication Sig Start Date End Date Taking? Authorizing Provider  albuterol (PROVENTIL HFA;VENTOLIN HFA) 108 (90 BASE) MCG/ACT inhaler Inhale 2 puffs into the lungs every 4 (four) hours as needed for wheezing or shortness of breath.   Yes Historical Provider, MD  aspirin EC 81 MG EC tablet Take 1 tablet (81 mg total) by mouth daily. 07/06/15  Yes Angus McInnis, MD  bisoprolol (ZEBETA) 5 MG tablet Take 5 mg by mouth every morning.   Yes Historical Provider, MD  carvedilol (COREG) 6.25 MG tablet Take 1  tablet (6.25 mg total) by mouth 2 (two) times daily with a meal. 07/06/15  Yes Angus McInnis, MD  furosemide (LASIX) 20 MG tablet Take 20 mg by mouth every morning.   Yes Historical Provider, MD  isosorbide mononitrate (IMDUR) 30 MG 24  hr tablet Take 0.5 tablets (15 mg total) by mouth daily. 07/06/15  Yes Angus McInnis, MD  lisinopril (PRINIVIL,ZESTRIL) 5 MG tablet Take 1 tablet (5 mg total) by mouth daily. 07/06/15  Yes Marjean Donna, MD  meclizine (ANTIVERT) 50 MG tablet Take 0.5 tablets (25 mg total) by mouth 3 (three) times daily as needed for dizziness. 08/16/15  Yes Forde Dandy, MD  metFORMIN (GLUCOPHAGE) 500 MG tablet Take 500 mg by mouth 2 (two) times daily.    Yes Historical Provider, MD  potassium chloride SA (K-DUR,KLOR-CON) 20 MEQ tablet Take 1 tablet (20 mEq total) by mouth daily. 06/11/14  Yes Marjean Donna, MD     Allergies:  No Known Allergies  Social History:   reports that she has never smoked. She has never used smokeless tobacco. She reports that she does not drink alcohol or use illicit drugs.  Family History: Family History  Problem Relation Age of Onset  . Asthma Son      Physical Exam: Filed Vitals:   09/01/15 1726 09/01/15 2030 09/01/15 2120 09/01/15 2200  BP: 164/124 177/126 150/100 139/84  Pulse: 101  102 87  Temp: 97.5 F (36.4 C)     TempSrc: Oral     Resp: 18 37 30 24  Height: 5\' 2"  (1.575 m)     Weight: 68.04 kg (150 lb)     SpO2: 95%  100% 91%   Blood pressure 139/84, pulse 87, temperature 97.5 F (36.4 C), temperature source Oral, resp. rate 24, height 5\' 2"  (1.575 m), weight 68.04 kg (150 lb), SpO2 91 %.  GEN:  Pleasant  patient lying in the stretcher in no acute distress; cooperative with exam. PSYCH:  alert and oriented x4; does not appear anxious or depressed; affect is appropriate. HEENT: Mucous membranes pink and anicteric; PERRLA; EOM intact; no cervical lymphadenopathy nor thyromegaly or carotid bruit; no JVD; There were no stridor. Neck is very supple. Breasts:: Not examined CHEST WALL: No tenderness CHEST: Normal respiration, clear to auscultation bilaterally, a little bibasilar crackles but no wheezing.  HEART: Regular rate and rhythm.  There are no murmur, rub, or  gallops.   BACK: No kyphosis or scoliosis; no CVA tenderness ABDOMEN: soft and non-tender; no masses, no organomegaly, normal abdominal bowel sounds; no pannus; no intertriginous candida. There is no rebound and no distention. Rectal Exam: Not done EXTREMITIES: No bone or joint deformity; age-appropriate arthropathy of the hands and knees; trace edema bilaterally. no ulcerations.  There is no calf tenderness. Genitalia: not examined PULSES: 2+ and symmetric SKIN: Normal hydration no rash or ulceration CNS: Cranial nerves 2-12 grossly intact no focal lateralizing neurologic deficit.  Speech is fluent; uvula elevated with phonation, facial symmetry and tongue midline. DTR are normal bilaterally, cerebella exam is intact, barbinski is negative and strengths are equaled bilaterally.  No sensory loss.   Labs on Admission:  Basic Metabolic Panel:  Recent Labs Lab 09/01/15 2041  NA 143  K 3.7  CL 114*  CO2 22  GLUCOSE 156*  BUN 24*  CREATININE 1.19*  CALCIUM 9.7   Liver Function Tests:  Recent Labs Lab 09/01/15 2041  AST 34  ALT 26  ALKPHOS 181*  BILITOT 1.4*  PROT 7.6  ALBUMIN  4.0   No results for input(s): LIPASE, AMYLASE in the last 168 hours. No results for input(s): AMMONIA in the last 168 hours. CBC:  Recent Labs Lab 09/01/15 2041  WBC 7.1  HGB 13.5  HCT 41.7  MCV 84.9  PLT 221   Cardiac Enzymes:  Recent Labs Lab 09/01/15 2041  TROPONINI 0.04*    CBG: No results for input(s): GLUCAP in the last 168 hours.   Radiological Exams on Admission: Dg Chest 2 View  09/01/2015   CLINICAL DATA:  SOB since arrival to ED today. Pt became SOB with the exertion of walking to exam room, pulse ox 81% on RA, resp rate mid 30's, unable to speak in long sentences, pt states that she started with bilateral leg swelling this am,  EXAM: CHEST - 2 VIEW  COMPARISON:  08/16/2015  FINDINGS: Moderate cardiomegaly. Central vascular congestion with mild diffuse bilateral interstitial  edema or infiltrates, slightly increased since previous exam. No definite effusion. Atheromatous aorta. No pneumothorax. Visualized skeletal structures are unremarkable.  IMPRESSION: 1. Cardiomegaly with some interval increase in bilateral interstitial edema/infiltrates.   Electronically Signed   By: Lucrezia Europe M.D.   On: 09/01/2015 21:40    Assessment/Plan Present on Admission:  . Acute on chronic combined systolic and diastolic CHF (congestive heart failure) . Asthma exacerbation . Essential hypertension . Mitral regurgitation . Right bundle branch block  PLAN:  Will admit her for mild acute on chronic combined CHF.  She will need a little more diuresis, so will give Lasix 40 mg BID.  WIll continue with her K, and check basic metabolic profile daily, with daily weights, and strict I and O.  She is stable full code, and will be admitted to Dr Everette Rank service.  For her HTN, will use IV Hydralazine as required.  I have continued her home meds.  She showed no evidence of ischemic cardiomyopathy.  Thank you for letting me participate in her care. She is a full code.   Other plans as per orders.  Code Status: FULL Haskel Khan, MD. [facility] Pager [phone] 7pm to 7am.  09/01/2015, 11:17 PM

## 2015-09-01 NOTE — ED Provider Notes (Signed)
CSN: [id number]     Arrival date & time 09/01/15  1721 History   First MD Initiated Contact with Patient 09/01/15 1933     Chief Complaint  Patient presents with  . Abdominal Pain     (Consider location/radiation/quality/duration/timing/severity/associated sxs/prior Treatment) HPI   Remingtyn LANGLEY FLATLEY is a 79 y.o. female presents for evaluation of shortness of breath, abdominal pain and leg swelling. Abdominal pain started today and she believes it was related to taking a Lasix pill. She's been having orthopnea and dyspnea on exertion for 1 week. She denies chest pain, back pain, weakness or dizziness. She is taking all of her usual medications. She has not been vomiting or having diarrhea. She denies dysuria or urinary frequency. There are no other known modifying factors.  Past Medical History  Diagnosis Date  . Nonischemic cardiomyopathy     LVEF 20-25%>>30-35% 05/2014  . Asthma   . Cerebrovascular disease     TIA and stroke   . Essential hypertension, benign   . Coronary atherosclerosis of native coronary artery     Nonobstructive at cath 2005  . Hypothyroidism   . Carotid artery disease   . Gout   . Renal artery stenosis     Bilateral  . Bronchitis   . Diastolic dysfunction 05/3874    Grade 3  . DM2 (diabetes mellitus, type 2) 04/25/2015  . Acute on chronic combined systolic and diastolic CHF (congestive heart failure) 06/06/2014  . Influenza 12/10/2012  . Acute respiratory failure with hypoxia 04/25/2015   Past Surgical History  Procedure Laterality Date  . Abdominal hysterectomy     Family History  Problem Relation Age of Onset  . Asthma Son    Social History  Substance Use Topics  . Smoking status: Never Smoker   . Smokeless tobacco: Never Used  . Alcohol Use: No   OB History    Gravida Para Term Preterm AB TAB SAB Ectopic Multiple Living   10 10 10             Review of Systems  All other systems reviewed and are negative.     Allergies  Review of  patient's allergies indicates no known allergies.  Home Medications   Prior to Admission medications   Medication Sig Start Date End Date Taking? Authorizing Provider  albuterol (PROVENTIL HFA;VENTOLIN HFA) 108 (90 BASE) MCG/ACT inhaler Inhale 2 puffs into the lungs every 4 (four) hours as needed for wheezing or shortness of breath.   Yes Historical Provider, MD  aspirin EC 81 MG EC tablet Take 1 tablet (81 mg total) by mouth daily. 07/06/15  Yes Angus McInnis, MD  bisoprolol (ZEBETA) 5 MG tablet Take 5 mg by mouth every morning.   Yes Historical Provider, MD  carvedilol (COREG) 6.25 MG tablet Take 1 tablet (6.25 mg total) by mouth 2 (two) times daily with a meal. 07/06/15  Yes Angus McInnis, MD  furosemide (LASIX) 20 MG tablet Take 20 mg by mouth every morning.   Yes Historical Provider, MD  isosorbide mononitrate (IMDUR) 30 MG 24 hr tablet Take 0.5 tablets (15 mg total) by mouth daily. 07/06/15  Yes Angus McInnis, MD  lisinopril (PRINIVIL,ZESTRIL) 5 MG tablet Take 1 tablet (5 mg total) by mouth daily. 07/06/15  Yes Marjean Donna, MD  meclizine (ANTIVERT) 50 MG tablet Take 0.5 tablets (25 mg total) by mouth 3 (three) times daily as needed for dizziness. 08/16/15  Yes Forde Dandy, MD  metFORMIN (GLUCOPHAGE) 500 MG tablet Take 500 mg  by mouth 2 (two) times daily.    Yes Historical Provider, MD  potassium chloride SA (K-DUR,KLOR-CON) 20 MEQ tablet Take 1 tablet (20 mEq total) by mouth daily. 06/11/14  Yes Angus McInnis, MD   BP 164/124 mmHg  Pulse 101  Temp(Src) 97.5 F (36.4 C) (Oral)  Resp 18  Ht 5\' 2"  (1.575 m)  Wt 150 lb (68.04 kg)  BMI 27.43 kg/m2  SpO2 95% Physical Exam  Constitutional: She is oriented to person, place, and time. She appears well-developed. No distress.  Elderly, frail  HENT:  Head: Normocephalic and atraumatic.  Right Ear: External ear normal.  Left Ear: External ear normal.  Eyes: Conjunctivae and EOM are normal. Pupils are equal, round, and reactive to light.  Neck:  Normal range of motion and phonation normal. Neck supple.  Cardiovascular: Normal rate, regular rhythm and normal heart sounds.   No JVD. Hypertensive.  Pulmonary/Chest: Effort normal. No respiratory distress. She has no wheezes. She exhibits no tenderness and no bony tenderness.  Crackles bilateral 1 [place] up.  Abdominal: Soft. She exhibits no distension. There is no tenderness.  Musculoskeletal: Normal range of motion. She exhibits edema (bilateral lower extremity 2+.).  Neurological: She is alert and oriented to person, place, and time. No cranial nerve deficit or sensory deficit. She exhibits normal muscle tone. Coordination normal.  Skin: Skin is warm, dry and intact.  Psychiatric: She has a normal mood and affect. Her behavior is normal. Judgment and thought content normal.  Nursing note and vitals reviewed.   ED Course  Procedures (including critical care time)  Hypoxia with ambulation, treated with nasal cannula oxygen, with improvement.  IV Lasix ordered for suspected CHF exacerbation.  Medications  furosemide (LASIX) injection 40 mg (not administered)    Patient Vitals for the past 24 hrs:  BP Temp Temp src Pulse Resp SpO2 Height Weight  09/01/15 1726 (!) 164/124 mmHg 97.5 F (36.4 C) Oral 101 18 95 % 5\' 2"  (1.575 m) 150 lb (68.04 kg)    10:00 PM Reevaluation with update and discussion. After initial assessment and treatment, an updated evaluation reveals she is voiding well and vitals improved with IV Lasix administration.Daleen Bo L   10:07 PM-Consult complete with Dr. Marin Comment. Patient case explained and discussed. He agrees to admit patient for further evaluation and treatment. Call ended at [place] Performed by: Richarda Blade Total critical care time: 35 min Critical care time was exclusive of separately billable procedures and treating other patients. Critical care was necessary to treat or prevent imminent or life-threatening  deterioration. Critical care was time spent personally by me on the following activities: development of treatment plan with patient and/or surrogate as well as nursing, discussions with consultants, evaluation of patient's response to treatment, examination of patient, obtaining history from patient or surrogate, ordering and performing treatments and interventions, ordering and review of laboratory studies, ordering and review of radiographic studies, pulse oximetry and re-evaluation of patient's condition.  Labs Review Labs Reviewed  COMPREHENSIVE METABOLIC PANEL  CBC  TROPONIN I  BRAIN NATRIURETIC PEPTIDE    Imaging Review No results found. I have personally reviewed and evaluated these images and lab results as part of my medical decision-making.   EKG Interpretation   Date/Time:  Tuesday September 01 2015 19:25:20 EDT Ventricular Rate:  103 PR Interval:  159 QRS Duration: 142 QT Interval:  415 QTC Calculation: 543 R Axis:   103 Text Interpretation:  Sinus tachycardia Multiple premature complexes, vent  Right bundle branch block since last tracing no significant change  Confirmed by [facility]  MD, Martavious Hartel [phone]) on 09/01/2015 7:37:39 PM      MDM   Final diagnoses:  Acute exacerbation of CHF (congestive heart failure)  Hypoxia    CHF exacerbation, acute, with secondary hypoxia. Doubt ACS, PE, pneumonia, or serious external infection. She will require patient for further diuresis and oxygen therapy, with expectant management. Patient has a low ejection fraction, cardiomyopathy.  Nursing Notes Reviewed/ Care Coordinated, and agree without changes. Applicable Imaging Reviewed.  Interpretation of Laboratory Data incorporated into ED treatment  Plan: Admit    Daleen Bo, MD 09/01/15 2314

## 2015-09-01 NOTE — ED Notes (Addendum)
After taking pt to tx room, pt became sob with the exertion of walking,( pt refused offer of wheelchair) pulse ox 81% on RA, resp rate mid 30's, unable to speak in long sentences, pt states that she started with bilateral leg swelling this am, took lasix tablet then started having abd pain. Pt admits that she does get sob at home sometimes but this is worse, pt placed on oxygen at 2lpm via [place] with increase to pulse ox of 95%, reports that she is breathing better,

## 2015-09-02 LAB — BASIC METABOLIC PANEL
Anion gap: 7 (ref 5–15)
BUN: 23 mg/dL — ABNORMAL HIGH (ref 6–20)
CO2: 26 mmol/L (ref 22–32)
Calcium: 9.1 mg/dL (ref 8.9–10.3)
Chloride: 111 mmol/L (ref 101–111)
Creatinine, Ser: 1.13 mg/dL — ABNORMAL HIGH (ref 0.44–1.00)
GFR calc Af Amer: 53 mL/min — ABNORMAL LOW (ref >60)
GFR calc non Af Amer: 46 mL/min — ABNORMAL LOW (ref >60)
Glucose, Bld: 113 mg/dL — ABNORMAL HIGH (ref 65–99)
Potassium: 3.2 mmol/L — ABNORMAL LOW (ref 3.5–5.1)
Sodium: 144 mmol/L (ref 135–145)

## 2015-09-02 LAB — GLUCOSE, CAPILLARY
Glucose-Capillary: 112 mg/dL — ABNORMAL HIGH (ref 65–99)
Glucose-Capillary: 91 mg/dL (ref 65–99)
Glucose-Capillary: 128 mg/dL — ABNORMAL HIGH (ref 65–99)
Glucose-Capillary: 126 mg/dL — ABNORMAL HIGH (ref 65–99)
Glucose-Capillary: 102 mg/dL — ABNORMAL HIGH (ref 65–99)

## 2015-09-02 LAB — CBC
HCT: 38.7 % (ref 36.0–46.0)
Hemoglobin: 12.1 g/dL (ref 12.0–15.0)
MCH: 26.8 pg (ref 26.0–34.0)
MCHC: 31.3 g/dL (ref 30.0–36.0)
MCV: 85.8 fL (ref 78.0–100.0)
Platelets: 210 10*3/uL (ref 150–400)
RBC: 4.51 MIL/uL (ref 3.87–5.11)
RDW: 15.8 % — ABNORMAL HIGH (ref 11.5–15.5)
WBC: 5.7 10*3/uL (ref 4.0–10.5)

## 2015-09-02 LAB — TSH: TSH: 2.249 u[IU]/mL (ref 0.350–4.500)

## 2015-09-02 LAB — MRSA PCR SCREENING: MRSA by PCR: POSITIVE — AB

## 2015-09-02 MED ORDER — CARVEDILOL 3.125 MG PO TABS
6.2500 mg | ORAL_TABLET | Freq: Two times a day (BID) | ORAL | Status: DC
  Administered 2015-09-02 – 2015-09-04 (×6): 6.25 mg via ORAL
  Filled 2015-09-02 (×6): qty 2

## 2015-09-02 MED ORDER — ASPIRIN EC 81 MG PO TBEC
81.0000 mg | DELAYED_RELEASE_TABLET | Freq: Every day | ORAL | Status: DC
  Administered 2015-09-02 – 2015-09-04 (×3): 81 mg via ORAL
  Filled 2015-09-02 (×3): qty 1

## 2015-09-02 MED ORDER — LISINOPRIL 5 MG PO TABS
5.0000 mg | ORAL_TABLET | Freq: Every day | ORAL | Status: DC
  Administered 2015-09-02 – 2015-09-03 (×2): 5 mg via ORAL
  Filled 2015-09-02 (×3): qty 1

## 2015-09-02 MED ORDER — HEPARIN SODIUM (PORCINE) 5000 UNIT/ML IJ SOLN
5000.0000 [IU] | Freq: Three times a day (TID) | INTRAMUSCULAR | Status: DC
  Administered 2015-09-02 – 2015-09-04 (×7): 5000 [IU] via SUBCUTANEOUS
  Filled 2015-09-02 (×7): qty 1

## 2015-09-02 MED ORDER — ALBUTEROL SULFATE (2.5 MG/3ML) 0.083% IN NEBU
2.5000 mg | INHALATION_SOLUTION | RESPIRATORY_TRACT | Status: DC | PRN
Start: 2015-09-02 — End: 2015-09-04

## 2015-09-02 MED ORDER — CHLORHEXIDINE GLUCONATE CLOTH 2 % EX PADS
6.0000 | MEDICATED_PAD | Freq: Every day | CUTANEOUS | Status: DC
  Administered 2015-09-02 – 2015-09-04 (×3): 6 via TOPICAL

## 2015-09-02 MED ORDER — INSULIN ASPART 100 UNIT/ML ~~LOC~~ SOLN: 0.0000 [IU] | Freq: Every day | SUBCUTANEOUS | Status: DC

## 2015-09-02 MED ORDER — METFORMIN HCL 500 MG PO TABS
500.0000 mg | ORAL_TABLET | Freq: Two times a day (BID) | ORAL | Status: DC
  Administered 2015-09-02 – 2015-09-04 (×6): 500 mg via ORAL
  Filled 2015-09-02 (×6): qty 1

## 2015-09-02 MED ORDER — BISOPROLOL FUMARATE 5 MG PO TABS
5.0000 mg | ORAL_TABLET | Freq: Every morning | ORAL | Status: DC
  Administered 2015-09-02 – 2015-09-04 (×2): 5 mg via ORAL
  Filled 2015-09-02 (×6): qty 1

## 2015-09-02 MED ORDER — MUPIROCIN 2 % EX OINT
1.0000 | TOPICAL_OINTMENT | Freq: Two times a day (BID) | CUTANEOUS | Status: DC
  Administered 2015-09-02 – 2015-09-04 (×5): 1 via NASAL
  Filled 2015-09-02 (×2): qty 22

## 2015-09-02 MED ORDER — ALBUTEROL (5 MG/ML) CONTINUOUS INHALATION SOLN: 3.0000 mL | INHALATION_SOLUTION | RESPIRATORY_TRACT | Status: DC | PRN

## 2015-09-02 MED ORDER — ISOSORBIDE MONONITRATE ER 30 MG PO TB24
15.0000 mg | ORAL_TABLET | Freq: Every day | ORAL | Status: DC
  Administered 2015-09-02 – 2015-09-03 (×2): 15 mg via ORAL
  Filled 2015-09-02 (×3): qty 1

## 2015-09-02 MED ORDER — POTASSIUM CHLORIDE CRYS ER 20 MEQ PO TBCR: 20.0000 meq | EXTENDED_RELEASE_TABLET | Freq: Every day | ORAL | Status: DC

## 2015-09-02 MED ORDER — FUROSEMIDE 10 MG/ML IJ SOLN
40.0000 mg | Freq: Two times a day (BID) | INTRAMUSCULAR | Status: DC
  Administered 2015-09-02 – 2015-09-04 (×5): 40 mg via INTRAVENOUS
  Filled 2015-09-02 (×7): qty 4

## 2015-09-02 MED ORDER — POTASSIUM CHLORIDE CRYS ER 20 MEQ PO TBCR
20.0000 meq | EXTENDED_RELEASE_TABLET | Freq: Two times a day (BID) | ORAL | Status: DC
  Administered 2015-09-02 – 2015-09-04 (×5): 20 meq via ORAL
  Filled 2015-09-02 (×5): qty 1

## 2015-09-02 MED ORDER — INSULIN ASPART 100 UNIT/ML ~~LOC~~ SOLN
0.0000 [IU] | Freq: Three times a day (TID) | SUBCUTANEOUS | Status: DC
  Administered 2015-09-02 (×2): 1 [IU] via SUBCUTANEOUS
  Administered 2015-09-03 – 2015-09-04 (×4): 2 [IU] via SUBCUTANEOUS

## 2015-09-02 MED ORDER — CLONIDINE HCL 0.1 MG PO TABS
0.1000 mg | ORAL_TABLET | Freq: Once | ORAL | Status: AC
  Administered 2015-09-02: 0.1 mg via ORAL
  Filled 2015-09-02: qty 1

## 2015-09-02 MED ORDER — ONDANSETRON HCL 4 MG PO TABS: 4.0000 mg | ORAL_TABLET | Freq: Four times a day (QID) | ORAL | Status: DC | PRN

## 2015-09-02 MED ORDER — ONDANSETRON HCL 4 MG/2ML IJ SOLN: 4.0000 mg | Freq: Four times a day (QID) | INTRAMUSCULAR | Status: DC | PRN

## 2015-09-02 NOTE — Plan of Care (Signed)
Pt transferred to room 314 d/t need for room (for critical pt coming from 300) - pt was also in overflow. VSS and RN assessment revealed stability for transfer.  Report called to Tedd Sias, RN.

## 2015-09-02 NOTE — Care Management Note (Signed)
Case Management Note  Patient Details  Name: JERRI GLAUSER MRN: [id number] Date of Birth: 27-Sep-1936  Expected Discharge Date:    09/04/2015              Expected Discharge Plan:  Home/Self Care  In-House Referral:  NA  Discharge planning Services  CM Consult  Post Acute Care Choice:  NA Choice offered to:  NA  DME Arranged:    DME Agency:     HH Arranged:    HH Agency:     Status of Service:  In process, will continue to follow  Medicare Important Message Given:    Date Medicare IM Given:    Medicare IM give by:    Date Additional Medicare IM Given:    Additional Medicare Important Message give by:     If discussed at [place] of Stay Meetings, dates discussed:    Additional Comments: Pt is from home, lives with grandson and is ind at baseline. Pt has no DME or HH services prior to admission but has used [facility] in the past. Will cont to follow for DC planning.  Sherald Barge, RN 09/02/2015, 3:17 PM

## 2015-09-02 NOTE — Progress Notes (Signed)
Subjective: The patient is alert and oriented. She's begun to diurese. She presented to ED with increased swelling in her legs and increase dyspnea on exertion chest x-ray showed bilateral interstitial infiltrates EKG showed no ischemic changes  Objective: Vital signs in last 24 hours: Temp:  [97.5 F (36.4 C)-97.7 F (36.5 C)] 97.7 F (36.5 C) (09/21 0400) Pulse Rate:  [76-106] 77 (09/21 0600) Resp:  [18-37] 24 (09/21 0600) BP: (136-177)/(81-126) 139/83 mmHg (09/21 0600) SpO2:  [91 %-100 %] 94 % (09/21 0600) Weight:  [68.04 kg (150 lb)-69.9 kg (154 lb 1.6 oz)] 69.9 kg (154 lb 1.6 oz) (09/21 0123) Weight change:  Last BM Date: 09/01/15  Intake/Output from previous day: 09/20 0701 - 09/21 0700 In: -  Out: 1525 [Urine:1525] Intake/Output this shift:    Physical Exam: Gen. appearance patient is alert and oriented  HEENT negative  Neck supple no JVD or thyroid abnormalities  Heart regular rhythm no murmurs  Abdomen the palpable organs or masses  Extremities mild edema  Skin warm and dry   Recent Labs  09/01/15 2041 09/02/15 0515  WBC 7.1 5.7  HGB 13.5 12.1  HCT 41.7 38.7  PLT 221 210   BMET  Recent Labs  09/01/15 2041 09/02/15 0515  NA 143 144  K 3.7 3.2*  CL 114* 111  CO2 22 26  GLUCOSE 156* 113*  BUN 24* 23*  CREATININE 1.19* 1.13*  CALCIUM 9.7 9.1    Studies/Results: Dg Chest 2 View  09/01/2015   CLINICAL DATA:  SOB since arrival to ED today. Pt became SOB with the exertion of walking to exam room, pulse ox 81% on RA, resp rate mid 30's, unable to speak in long sentences, pt states that she started with bilateral leg swelling this am,  EXAM: CHEST - 2 VIEW  COMPARISON:  08/16/2015  FINDINGS: Moderate cardiomegaly. Central vascular congestion with mild diffuse bilateral interstitial edema or infiltrates, slightly increased since previous exam. No definite effusion. Atheromatous aorta. No pneumothorax. Visualized skeletal structures are unremarkable.   IMPRESSION: 1. Cardiomegaly with some interval increase in bilateral interstitial edema/infiltrates.   Electronically Signed   By: Lucrezia Europe M.D.   On: 09/01/2015 21:40    Medications:  . aspirin EC  81 mg Oral Daily  . bisoprolol  5 mg Oral q morning - 10a  . carvedilol  6.25 mg Oral BID WC  . Chlorhexidine Gluconate Cloth  6 each Topical Q0600  . furosemide  40 mg Intravenous BID  . heparin  5,000 Units Subcutaneous 3 times per day  . insulin aspart  0-5 Units Subcutaneous QHS  . insulin aspart  0-9 Units Subcutaneous TID WC  . isosorbide mononitrate  15 mg Oral Daily  . lisinopril  5 mg Oral Daily  . metFORMIN  500 mg Oral BID WC  . mupirocin ointment  1 application Nasal BID  . potassium chloride SA  20 mEq Oral Daily        Assessment/Plan: 1. Acute on chronic systolic and diastolic CHF-plan to continue current IV furosemide replete potassium 2. Hypertension continue IV hydralazine as required   LOS: 1 day   Tommy Goostree G 09/02/2015, 7:12 AM

## 2015-09-02 NOTE — Progress Notes (Signed)
Placed peripheral  IV due to after evaluation of patient. Patient not receiving meds that require picc placement.

## 2015-09-03 LAB — GLUCOSE, CAPILLARY
Glucose-Capillary: 151 mg/dL — ABNORMAL HIGH (ref 65–99)
Glucose-Capillary: 90 mg/dL (ref 65–99)
Glucose-Capillary: 199 mg/dL — ABNORMAL HIGH (ref 65–99)
Glucose-Capillary: 126 mg/dL — ABNORMAL HIGH (ref 65–99)

## 2015-09-03 LAB — BASIC METABOLIC PANEL
Anion gap: 5 (ref 5–15)
BUN: 31 mg/dL — ABNORMAL HIGH (ref 6–20)
CO2: 28 mmol/L (ref 22–32)
Calcium: 8.7 mg/dL — ABNORMAL LOW (ref 8.9–10.3)
Chloride: 108 mmol/L (ref 101–111)
Creatinine, Ser: 1.58 mg/dL — ABNORMAL HIGH (ref 0.44–1.00)
GFR calc Af Amer: 36 mL/min — ABNORMAL LOW (ref >60)
GFR calc non Af Amer: 31 mL/min — ABNORMAL LOW (ref >60)
Glucose, Bld: 106 mg/dL — ABNORMAL HIGH (ref 65–99)
Potassium: 3.8 mmol/L (ref 3.5–5.1)
Sodium: 141 mmol/L (ref 135–145)

## 2015-09-03 NOTE — Progress Notes (Signed)
Subjective: The patient is alert and oriented she is diuresing. She presented to ED with increased swelling in her legs and increased dyspnea chest x-ray showed bilateral interstitial infiltrates EKG showed no ischemic changes she did have a low serum potassium  Objective: Vital signs in last 24 hours: Temp:  [97.6 F (36.4 C)-98.2 F (36.8 C)] 97.6 F (36.4 C) (09/22 0545) Pulse Rate:  [56-78] 56 (09/22 0545) Resp:  [20-22] 20 (09/21 2121) BP: (95-142)/(55-92) 104/65 mmHg (09/22 0545) SpO2:  [86 %-100 %] 100 % (09/22 0545) Weight:  [69.491 kg (153 lb 3.2 oz)] 69.491 kg (153 lb 3.2 oz) (09/22 0545) Weight change: 1.452 kg (3 lb 3.2 oz) Last BM Date: 09/02/15  Intake/Output from previous day: 09/21 0701 - 09/22 0700 In: 480 [P.O.:480] Out: 1750 [Urine:1750] Intake/Output this shift: Total I/O In: -  Out: 950 [Urine:950]  Physical Exam: Gen. appearance patient is alert and oriented  HEENT negative  Neck supple no JVD or thyroid abnormalities  Heart regular rhythm no murmurs  Abdomen the palpable organs or masses  Extremities mild edema  Skin warm and dry   Recent Labs  09/01/15 2041 09/02/15 0515  WBC 7.1 5.7  HGB 13.5 12.1  HCT 41.7 38.7  PLT 221 210   BMET  Recent Labs  09/01/15 2041 09/02/15 0515  NA 143 144  K 3.7 3.2*  CL 114* 111  CO2 22 26  GLUCOSE 156* 113*  BUN 24* 23*  CREATININE 1.19* 1.13*  CALCIUM 9.7 9.1    Studies/Results: Dg Chest 2 View  09/01/2015   CLINICAL DATA:  SOB since arrival to ED today. Pt became SOB with the exertion of walking to exam room, pulse ox 81% on RA, resp rate mid 30's, unable to speak in long sentences, pt states that she started with bilateral leg swelling this am,  EXAM: CHEST - 2 VIEW  COMPARISON:  08/16/2015  FINDINGS: Moderate cardiomegaly. Central vascular congestion with mild diffuse bilateral interstitial edema or infiltrates, slightly increased since previous exam. No definite effusion. Atheromatous  aorta. No pneumothorax. Visualized skeletal structures are unremarkable.  IMPRESSION: 1. Cardiomegaly with some interval increase in bilateral interstitial edema/infiltrates.   Electronically Signed   By: Lucrezia Europe M.D.   On: 09/01/2015 21:40    Medications:  . aspirin EC  81 mg Oral Daily  . bisoprolol  5 mg Oral q morning - 10a  . carvedilol  6.25 mg Oral BID WC  . Chlorhexidine Gluconate Cloth  6 each Topical Q0600  . furosemide  40 mg Intravenous BID  . heparin  5,000 Units Subcutaneous 3 times per day  . insulin aspart  0-5 Units Subcutaneous QHS  . insulin aspart  0-9 Units Subcutaneous TID WC  . isosorbide mononitrate  15 mg Oral Daily  . lisinopril  5 mg Oral Daily  . metFORMIN  500 mg Oral BID WC  . mupirocin ointment  1 application Nasal BID  . potassium chloride SA  20 mEq Oral BID        Assessment/Plan: 1 acute on chronic systolic and diastolic congestive heart failure-plan to continue current IV furosemide  2 low serum potassium-replete potassium  3. Hypertension essential continue hydralazine   LOS: 2 days   MCINNIS,ANGUS G 09/03/2015, 6:39 AM

## 2015-09-04 LAB — BASIC METABOLIC PANEL
Anion gap: 6 (ref 5–15)
BUN: 34 mg/dL — ABNORMAL HIGH (ref 6–20)
CO2: 27 mmol/L (ref 22–32)
Calcium: 8.7 mg/dL — ABNORMAL LOW (ref 8.9–10.3)
Chloride: 108 mmol/L (ref 101–111)
Creatinine, Ser: 1.81 mg/dL — ABNORMAL HIGH (ref 0.44–1.00)
GFR calc Af Amer: 30 mL/min — ABNORMAL LOW (ref >60)
GFR calc non Af Amer: 26 mL/min — ABNORMAL LOW (ref >60)
Glucose, Bld: 119 mg/dL — ABNORMAL HIGH (ref 65–99)
Potassium: 4.1 mmol/L (ref 3.5–5.1)
Sodium: 141 mmol/L (ref 135–145)

## 2015-09-04 LAB — GLUCOSE, CAPILLARY
Glucose-Capillary: 94 mg/dL (ref 65–99)
Glucose-Capillary: 163 mg/dL — ABNORMAL HIGH (ref 65–99)
Glucose-Capillary: 123 mg/dL — ABNORMAL HIGH (ref 65–99)

## 2015-09-04 NOTE — Discharge Summary (Signed)
Physician Discharge Summary  [name] ZOX:[id number] DOB: [birth year] DOA: 09/01/2015  PCP: Lanette Hampshire, MD  Admit date: 09/01/2015 Discharge date: 09/04/2015   Follow-up Information    Follow up with [facility].   Contact information:   [address] [place] [address] [phone]       Discharge Diagnoses:  1. Acute on chronic systolic and diastolic congestive heart failure 2. Hypokalemia 3. Hypertension essential 4. Nonischemic cardiomyopathy 5. Diabetes mellitus type 2  Discharge Condition: Stable Disposition: Home  Diet recommendation: 2000-calorie ADA 2 g sodium diet  Filed Weights   09/02/15 0123 09/03/15 0545 09/04/15 0558  Weight: 69.9 kg (154 lb 1.6 oz) 69.491 kg (153 lb 3.2 oz) 68.89 kg (151 lb 14 oz)    History of present illness:  The patient presented to ED with a complaint of increased shortness of breath. Chest x-ray showed bilateral interstitial infiltrate with BNP 2425 EKG showed no ischemic changes. The patient was given intravenous Lasix in ED department and began to feel better was subsequently admitted  Hospital Course:  Examination on admission revealed alert patient with increased dyspnea. Heart and lungs negative with exception of diminished breath sounds. Extremities mild edema. The patient was felt to have mild chronic CHF she was started on Lasix 40 mg twice a day potassium was monitored daily weights were monitored she began to diurese again slowly improved. She was continued on medications listed below. Chest x-ray on admission showed evidence of mild interstitial infiltrate and cardiomegaly. She did have a mildly increase in her creatinine   Discharge Instructions The patient was instructed to continue medications listed below. She was instructed to make appointment with primary care physician in 3-4 days for follow-up visit    Medication List    STOP taking these medications        bisoprolol 5 MG  tablet  Commonly known as:  ZEBETA      TAKE these medications        albuterol 108 (90 BASE) MCG/ACT inhaler  Commonly known as:  PROVENTIL HFA;VENTOLIN HFA  Inhale 2 puffs into the lungs every 4 (four) hours as needed for wheezing or shortness of breath.     aspirin 81 MG EC tablet  Take 1 tablet (81 mg total) by mouth daily.     carvedilol 6.25 MG tablet  Commonly known as:  COREG  Take 1 tablet (6.25 mg total) by mouth 2 (two) times daily with a meal.     furosemide 20 MG tablet  Commonly known as:  LASIX  Take 20 mg by mouth every morning.     isosorbide mononitrate 30 MG 24 hr tablet  Commonly known as:  IMDUR  Take 0.5 tablets (15 mg total) by mouth daily.     lisinopril 5 MG tablet  Commonly known as:  PRINIVIL,ZESTRIL  Take 1 tablet (5 mg total) by mouth daily.     meclizine 50 MG tablet  Commonly known as:  ANTIVERT  Take 0.5 tablets (25 mg total) by mouth 3 (three) times daily as needed for dizziness.     metFORMIN 500 MG tablet  Commonly known as:  GLUCOPHAGE  Take 500 mg by mouth 2 (two) times daily.     potassium chloride SA 20 MEQ tablet  Commonly known as:  K-DUR,KLOR-CON  Take 1 tablet (20 mEq total) by mouth daily.       No Known Allergies  The results of significant diagnostics from this hospitalization (including imaging, microbiology,  ancillary and laboratory) are listed below for reference.    Significant Diagnostic Studies: Dg Chest 2 View  09/01/2015   CLINICAL DATA:  SOB since arrival to ED today. Pt became SOB with the exertion of walking to exam room, pulse ox 81% on RA, resp rate mid 30's, unable to speak in long sentences, pt states that she started with bilateral leg swelling this am,  EXAM: CHEST - 2 VIEW  COMPARISON:  08/16/2015  FINDINGS: Moderate cardiomegaly. Central vascular congestion with mild diffuse bilateral interstitial edema or infiltrates, slightly increased since previous exam. No definite effusion. Atheromatous aorta. No  pneumothorax. Visualized skeletal structures are unremarkable.  IMPRESSION: 1. Cardiomegaly with some interval increase in bilateral interstitial edema/infiltrates.   Electronically Signed   By: Lucrezia Europe M.D.   On: 09/01/2015 21:40   Dg Chest 2 View  08/16/2015   CLINICAL DATA:  79 year old female with nausea, dizziness and hypoxia  EXAM: CHEST  2 VIEW  COMPARISON:  Prior chest x-ray 07/02/2015  FINDINGS: Stable mild cardiomegaly. Atherosclerotic calcifications again noted in the transverse aorta. Similar appearance of diffuse bilateral interstitial prominence which may be chronic or represent chronic interstitial edema. There are small bilateral pleural effusions. Central bronchitic changes are similar compared to prior. No acute osseous abnormality. No new focal airspace consolidation, or pneumothorax.  IMPRESSION: 1. Stable appearance of the chest with diffuse bilateral interstitial prominence which may reflect chronic mild interstitial pulmonary edema or chronic interstitial pulmonary parenchymal changes. 2. Stable cardiomegaly. 3. Small bilateral layering effusions.   Electronically Signed   By: Jacqulynn Cadet M.D.   On: 08/16/2015 14:44    Microbiology: Recent Results (from the past 240 hour(s))  MRSA PCR Screening     Status: Abnormal   Collection Time: 09/02/15  1:05 AM  Result Value Ref Range Status   MRSA by PCR POSITIVE (A) NEGATIVE Final    Comment:        The GeneXpert MRSA Assay (FDA approved for NASAL specimens only), is one component of a comprehensive MRSA colonization surveillance program. It is not intended to diagnose MRSA infection nor to guide or monitor treatment for MRSA infections. RESULT CALLED TO, READ BACK BY AND VERIFIED WITH: WAGONER R AT 0404 ON [id number] BY FORSYTH K      Labs: Basic Metabolic Panel:  Recent Labs Lab 09/01/15 2041 09/02/15 0515 09/03/15 0619 09/04/15 0639  NA 143 144 141 141  K 3.7 3.2* 3.8 4.1  CL 114* 111 108 108  CO2 22 26 28  27   GLUCOSE 156* 113* 106* 119*  BUN 24* 23* 31* 34*  CREATININE 1.19* 1.13* 1.58* 1.81*  CALCIUM 9.7 9.1 8.7* 8.7*   Liver Function Tests:  Recent Labs Lab 09/01/15 2041  AST 34  ALT 26  ALKPHOS 181*  BILITOT 1.4*  PROT 7.6  ALBUMIN 4.0   No results for input(s): LIPASE, AMYLASE in the last 168 hours. No results for input(s): AMMONIA in the last 168 hours. CBC:  Recent Labs Lab 09/01/15 2041 09/02/15 0515  WBC 7.1 5.7  HGB 13.5 12.1  HCT 41.7 38.7  MCV 84.9 85.8  PLT 221 210   Cardiac Enzymes:  Recent Labs Lab 09/01/15 2041  TROPONINI 0.04*   BNP: BNP (last 3 results)  Recent Labs  04/25/15 1841 07/02/15 0635 09/01/15 2041  BNP 1091.0* 2948.0* 2425.0*    ProBNP (last 3 results) No results for input(s): PROBNP in the last 8760 hours.  CBG:  Recent Labs Lab 09/03/15 1155 09/03/15 1636  09/03/15 2021 09/04/15 0748 09/04/15 1146  GLUCAP 90 199* 126* 94 163*    Principal Problem:   Acute on chronic combined systolic and diastolic CHF (congestive heart failure) Active Problems:   Essential hypertension   Asthma exacerbation   Mitral regurgitation   DM2 (diabetes mellitus, type 2)   Right bundle branch block   Acute exacerbation of CHF (congestive heart failure)   Time coordinating discharge: 30 minutes  Signed:  Marjean Donna, MD 09/04/2015, 3:53 PM

## 2015-09-04 NOTE — Progress Notes (Signed)
SATURATION QUALIFICATIONS: (This note is used to comply with regulatory documentation for home oxygen)  Patient Saturations on Room Air at Rest = 97% Patient placed on room air at 11:30  Patient Saturations on Room Air while Ambulating = 91-92% No complaints of pain, discomfort, SOB, dizziness  Patient Saturations on 0 Liters of oxygen while Ambulating = 91-92%  Patient does not meet criteria for home O2. Christinia Gully RN, BSN notified of the activity.

## 2015-09-04 NOTE — Progress Notes (Signed)
Subjective: The patient is alert and oriented. She continues to diurese. She presented to ED with increased swelling in her legs and increased dyspnea chest x-ray showed bilateral interstitial infiltrates EKG did not show evidence of ischemic changes but she did have a low potassium  Objective: Vital signs in last 24 hours: Temp:  [97.4 F (36.3 C)-98.1 F (36.7 C)] 98.1 F (36.7 C) (09/23 0558) Pulse Rate:  [53-72] 72 (09/23 0558) Resp:  [20] 20 (09/23 0558) BP: (100-131)/(58-69) 110/62 mmHg (09/23 0558) SpO2:  [100 %] 100 % (09/23 0558) Weight:  [68.89 kg (151 lb 14 oz)] 68.89 kg (151 lb 14 oz) (09/23 0558) Weight change: -0.601 kg (-1 lb 5.2 oz) Last BM Date: 09/02/15  Intake/Output from previous day: 09/22 0701 - 09/23 0700 In: 480 [P.O.:480] Out: 1800 [Urine:1800] Intake/Output this shift: Total I/O In: -  Out: 1800 [Urine:1800]  Physical Exam: Gen. appearance patient is alert and oriented  HEENT negative  Neck supple no JVD or thyroid abnormalities  Heart regular rhythm no murmurs  Abdomen no palpable organs or masses  Extremities mild edema  Skin warm and dry   Recent Labs  09/01/15 2041 09/02/15 0515  WBC 7.1 5.7  HGB 13.5 12.1  HCT 41.7 38.7  PLT 221 210   BMET  Recent Labs  09/02/15 0515 09/03/15 0619  NA 144 141  K 3.2* 3.8  CL 111 108  CO2 26 28  GLUCOSE 113* 106*  BUN 23* 31*  CREATININE 1.13* 1.58*  CALCIUM 9.1 8.7*    Studies/Results: No results found.  Medications:  . aspirin EC  81 mg Oral Daily  . bisoprolol  5 mg Oral q morning - 10a  . carvedilol  6.25 mg Oral BID WC  . Chlorhexidine Gluconate Cloth  6 each Topical Q0600  . furosemide  40 mg Intravenous BID  . heparin  5,000 Units Subcutaneous 3 times per day  . insulin aspart  0-5 Units Subcutaneous QHS  . insulin aspart  0-9 Units Subcutaneous TID WC  . isosorbide mononitrate  15 mg Oral Daily  . lisinopril  5 mg Oral Daily  . metFORMIN  500 mg Oral BID WC  .  mupirocin ointment  1 application Nasal BID  . potassium chloride SA  20 mEq Oral BID        Assessment/Plan: 1. Acute on chronic systolic and diastolic congestive heart failure-plan to continue current IV furosemide this could be transitioned to by mouth furosemide  2. Low serum potassium replete potassium  3. Hypertension essential continue hydralazine   LOS: 3 days   MCINNIS,ANGUS G 09/04/2015, 6:33 AM

## 2015-09-04 NOTE — Care Management Note (Signed)
Case Management Note  Patient Details  Name: AMBRIE CARTE MRN: [id number] Date of Birth: 16-Jan-1936  Subjective/Objective:                   Action/Plan:   Expected Discharge Date:                  Expected Discharge Plan:  [place]  In-House Referral:  NA  Discharge planning Services  CM Consult  Post Acute Care Choice:  NA Choice offered to:  NA  DME Arranged:    DME Agency:  [place]:  RN [facility] Agency:     Status of Service:  Completed, signed off  Medicare Important Message Given:  Yes-second notification given Date Medicare IM Given:    Medicare IM give by:    Date Additional Medicare IM Given:    Additional Medicare Important Message give by:     If discussed at [place] of Stay Meetings, dates discussed:    Additional Comments: Pt to be discharged home later today with [facility] RN (per pts choice). Romualdo Bolk of [facility] is aware and will collect the pts information from the chart. [place] services to start within 48 hours of discharge. Will need to assess for home O2 prior to discharge. Will continue to follow for discharge planning needs. Pt and pts nurse aware of discharge arrangements. Christinia Gully Rudd, RN 09/04/2015, 11:12 AM

## 2015-09-04 NOTE — Care Management Important Message (Signed)
Important Message  Patient Details  Name: [name] MRN: [id number] Date of Birth: 1936/06/18   Medicare Important Message Given:  Yes-second notification given    Joylene Draft, RN 09/04/2015, 11:10 AM

## 2015-10-01 ENCOUNTER — Emergency Department (HOSPITAL_COMMUNITY)
Admission: EM | Admit: 2015-10-01 | Discharge: 2015-10-01 | Disposition: A | Discharge status: Home / Self Care | Payer: Medicare Other | Attending: Emergency Medicine | Admitting: Emergency Medicine

## 2015-10-01 ENCOUNTER — Emergency Department (HOSPITAL_COMMUNITY): Payer: Medicare Other

## 2015-10-01 DIAGNOSIS — J45901 Unspecified asthma with (acute) exacerbation: Secondary | ICD-10-CM

## 2015-10-01 DIAGNOSIS — I5043 Acute on chronic combined systolic (congestive) and diastolic (congestive) heart failure: Secondary | ICD-10-CM | POA: Insufficient documentation

## 2015-10-01 DIAGNOSIS — M109 Gout, unspecified: Secondary | ICD-10-CM | POA: Insufficient documentation

## 2015-10-01 DIAGNOSIS — I2510 Atherosclerotic heart disease of native coronary artery without angina pectoris: Secondary | ICD-10-CM | POA: Diagnosis not present

## 2015-10-01 DIAGNOSIS — I10 Essential (primary) hypertension: Secondary | ICD-10-CM | POA: Insufficient documentation

## 2015-10-01 DIAGNOSIS — J441 Chronic obstructive pulmonary disease with (acute) exacerbation: Secondary | ICD-10-CM | POA: Insufficient documentation

## 2015-10-01 DIAGNOSIS — Z7982 Long term (current) use of aspirin: Secondary | ICD-10-CM | POA: Insufficient documentation

## 2015-10-01 DIAGNOSIS — Z79899 Other long term (current) drug therapy: Secondary | ICD-10-CM | POA: Diagnosis not present

## 2015-10-01 DIAGNOSIS — Z8673 Personal history of transient ischemic attack (TIA), and cerebral infarction without residual deficits: Secondary | ICD-10-CM | POA: Insufficient documentation

## 2015-10-01 DIAGNOSIS — R600 Localized edema: Secondary | ICD-10-CM | POA: Insufficient documentation

## 2015-10-01 DIAGNOSIS — E119 Type 2 diabetes mellitus without complications: Secondary | ICD-10-CM | POA: Insufficient documentation

## 2015-10-01 DIAGNOSIS — I509 Heart failure, unspecified: Secondary | ICD-10-CM

## 2015-10-01 DIAGNOSIS — R0602 Shortness of breath: Secondary | ICD-10-CM | POA: Diagnosis present

## 2015-10-01 LAB — COMPREHENSIVE METABOLIC PANEL
ALT: 27 U/L (ref 14–54)
AST: 31 U/L (ref 15–41)
Albumin: 3.3 g/dL — ABNORMAL LOW (ref 3.5–5.0)
Alkaline Phosphatase: 260 U/L — ABNORMAL HIGH (ref 38–126)
Anion gap: 7 (ref 5–15)
BUN: 29 mg/dL — ABNORMAL HIGH (ref 6–20)
CO2: 23 mmol/L (ref 22–32)
Calcium: 9.3 mg/dL (ref 8.9–10.3)
Chloride: 111 mmol/L (ref 101–111)
Creatinine, Ser: 1.47 mg/dL — ABNORMAL HIGH (ref 0.44–1.00)
GFR calc Af Amer: 39 mL/min — ABNORMAL LOW (ref >60)
GFR calc non Af Amer: 33 mL/min — ABNORMAL LOW (ref >60)
Glucose, Bld: 151 mg/dL — ABNORMAL HIGH (ref 65–99)
Potassium: 4 mmol/L (ref 3.5–5.1)
Sodium: 141 mmol/L (ref 135–145)
Total Bilirubin: 1.3 mg/dL — ABNORMAL HIGH (ref 0.3–1.2)
Total Protein: 6.4 g/dL — ABNORMAL LOW (ref 6.5–8.1)

## 2015-10-01 LAB — CBC WITH DIFFERENTIAL/PLATELET
Basophils Absolute: 0 10*3/uL (ref 0.0–0.1)
Basophils Relative: 1 %
Eosinophils Absolute: 0 10*3/uL (ref 0.0–0.7)
Eosinophils Relative: 1 %
HCT: 39.3 % (ref 36.0–46.0)
Hemoglobin: 12.3 g/dL (ref 12.0–15.0)
Lymphocytes Relative: 28 %
Lymphs Abs: 1.5 10*3/uL (ref 0.7–4.0)
MCH: 26.9 pg (ref 26.0–34.0)
MCHC: 31.3 g/dL (ref 30.0–36.0)
MCV: 86 fL (ref 78.0–100.0)
Monocytes Absolute: 0.6 10*3/uL (ref 0.1–1.0)
Monocytes Relative: 11 %
Neutro Abs: 3.1 10*3/uL (ref 1.7–7.7)
Neutrophils Relative %: 59 %
Platelets: 213 10*3/uL (ref 150–400)
RBC: 4.57 MIL/uL (ref 3.87–5.11)
RDW: 16.3 % — ABNORMAL HIGH (ref 11.5–15.5)
WBC: 5.3 10*3/uL (ref 4.0–10.5)

## 2015-10-01 LAB — BRAIN NATRIURETIC PEPTIDE: B Natriuretic Peptide: 3582 pg/mL — ABNORMAL HIGH (ref 0.0–100.0)

## 2015-10-01 LAB — TROPONIN I: Troponin I: 0.03 ng/mL (ref <0.031)

## 2015-10-01 MED ORDER — PREDNISONE 20 MG PO TABS
40.0000 mg | ORAL_TABLET | Freq: Once | ORAL | Status: AC
  Administered 2015-10-01: 40 mg via ORAL
  Filled 2015-10-01: qty 2

## 2015-10-01 MED ORDER — PREDNISONE 20 MG PO TABS
ORAL_TABLET | ORAL | Status: DC
Start: 2015-10-01 — End: 2016-02-02

## 2015-10-01 MED ORDER — IPRATROPIUM-ALBUTEROL 0.5-2.5 (3) MG/3ML IN SOLN
3.0000 mL | RESPIRATORY_TRACT | Status: AC
  Administered 2015-10-01 (×2): 3 mL via RESPIRATORY_TRACT
  Filled 2015-10-01: qty 3

## 2015-10-01 MED ORDER — FUROSEMIDE 10 MG/ML IJ SOLN
40.0000 mg | Freq: Once | INTRAMUSCULAR | Status: AC
  Administered 2015-10-01: 40 mg via INTRAVENOUS
  Filled 2015-10-01: qty 4

## 2015-10-01 NOTE — ED Notes (Signed)
Patient reports being short of breath since yesterday. Denies hx of asthma or COPD.

## 2015-10-01 NOTE — ED Provider Notes (Signed)
CSN: [id number]     Arrival date & time 10/01/15  1630 History   First MD Initiated Contact with Patient 10/01/15 1638     Chief Complaint  Patient presents with  . Shortness of Breath     (Consider location/radiation/quality/duration/timing/severity/associated sxs/prior Treatment) Patient is a 79 y.o. female presenting with shortness of breath. The history is provided by the patient.  Shortness of Breath Severity:  Mild Onset quality:  Gradual Duration:  1 week Timing:  Constant Progression:  Worsening Chronicity:  Recurrent Context: URI   Relieved by:  Nothing Worsened by:  Nothing tried Ineffective treatments:  None tried Associated symptoms: cough   Associated symptoms: no chest pain, no fever, no headaches, no vomiting and no wheezing   Risk factors: no family hx of DVT, no hx of cancer, no hx of PE/DVT, no prolonged immobilization, no recent surgery and no tobacco use    79 yo F with a chief complaint of shortness of breath. This been going on for about a week. Patient states she's had increased cough congestion. Patient denies fevers or chills. Patient denies chest pain. Patient has a history of COPD as well as CHF. Patient feels that her swelling is at baseline. Denies increased sputum production or change in sputum. Patient states she's felt like this before and was bronchitis. Unsure what makes her breathing worse.  Past Medical History  Diagnosis Date  . Nonischemic cardiomyopathy     LVEF 20-25%>>30-35% 05/2014  . Asthma   . Cerebrovascular disease     TIA and stroke   . Essential hypertension, benign   . Coronary atherosclerosis of native coronary artery     Nonobstructive at cath 2005  . Hypothyroidism   . Carotid artery disease   . Gout   . Renal artery stenosis     Bilateral  . Bronchitis   . Diastolic dysfunction 06/349    Grade 3  . DM2 (diabetes mellitus, type 2) 04/25/2015  . Acute on chronic combined systolic and diastolic CHF (congestive heart  failure) 06/06/2014  . Influenza 12/10/2012  . Acute respiratory failure with hypoxia 04/25/2015   Past Surgical History  Procedure Laterality Date  . Abdominal hysterectomy     Family History  Problem Relation Age of Onset  . Asthma Son    Social History  Substance Use Topics  . Smoking status: Never Smoker   . Smokeless tobacco: Never Used  . Alcohol Use: No   OB History    Gravida Para Term Preterm AB TAB SAB Ectopic Multiple Living   10 10 10             Review of Systems  Constitutional: Negative for fever and chills.  HENT: Negative for congestion and rhinorrhea.   Eyes: Negative for redness and visual disturbance.  Respiratory: Positive for cough and shortness of breath. Negative for wheezing.   Cardiovascular: Negative for chest pain and palpitations.  Gastrointestinal: Negative for nausea and vomiting.  Genitourinary: Negative for dysuria and urgency.  Musculoskeletal: Negative for myalgias and arthralgias.  Skin: Negative for pallor and wound.  Neurological: Negative for dizziness and headaches.      Allergies  Review of patient's allergies indicates no known allergies.  Home Medications   Prior to Admission medications   Medication Sig Start Date End Date Taking? Authorizing Provider  albuterol (PROVENTIL HFA;VENTOLIN HFA) 108 (90 BASE) MCG/ACT inhaler Inhale 2 puffs into the lungs every 4 (four) hours as needed for wheezing or shortness of breath.   Yes Historical  Provider, MD  aspirin EC 81 MG EC tablet Take 1 tablet (81 mg total) by mouth daily. 07/06/15  Yes Angus McInnis, MD  carvedilol (COREG) 6.25 MG tablet Take 1 tablet (6.25 mg total) by mouth 2 (two) times daily with a meal. 07/06/15  Yes Angus McInnis, MD  furosemide (LASIX) 20 MG tablet Take 20 mg by mouth every morning.   Yes Historical Provider, MD  isosorbide mononitrate (IMDUR) 30 MG 24 hr tablet Take 0.5 tablets (15 mg total) by mouth daily. 07/06/15  Yes Angus McInnis, MD  lisinopril  (PRINIVIL,ZESTRIL) 5 MG tablet Take 1 tablet (5 mg total) by mouth daily. 07/06/15  Yes Marjean Donna, MD  meclizine (ANTIVERT) 50 MG tablet Take 0.5 tablets (25 mg total) by mouth 3 (three) times daily as needed for dizziness. 08/16/15  Yes Forde Dandy, MD  metFORMIN (GLUCOPHAGE) 500 MG tablet Take 500 mg by mouth 2 (two) times daily.    Yes Historical Provider, MD  predniSONE (DELTASONE) 20 MG tablet 2 tabs po daily x 3 days 10/01/15   Deno Etienne, DO   BP 141/102 mmHg  Pulse 71  Temp(Src) 97.5 F (36.4 C) (Oral)  Resp 26  Ht 5\' 5"  (1.651 m)  Wt 151 lb (68.493 kg)  BMI 25.13 kg/m2  SpO2 100% Physical Exam  Constitutional: She is oriented to person, place, and time. She appears well-developed and well-nourished. No distress.  HENT:  Head: Normocephalic and atraumatic.  jvd to mid neck   Eyes: EOM are normal. Pupils are equal, round, and reactive to light.  Neck: Normal range of motion. Neck supple.  Cardiovascular: Normal rate and regular rhythm.  Exam reveals gallop (fixed split s2). Exam reveals no friction rub.   No murmur heard. Pulmonary/Chest: Effort normal. She has no wheezes. She has no rales.  Diminished breath sounds bilaterally, no rales, rhonci  Abdominal: Soft. She exhibits no distension. There is no tenderness. There is no rebound and no guarding.  Musculoskeletal: She exhibits edema (2+ to the knees). She exhibits no tenderness.  Neurological: She is alert and oriented to person, place, and time.  Skin: Skin is warm and dry. She is not diaphoretic.  Psychiatric: She has a normal mood and affect. Her behavior is normal.  Nursing note and vitals reviewed.   ED Course  Procedures (including critical care time) Labs Review Labs Reviewed  BRAIN NATRIURETIC PEPTIDE - Abnormal; Notable for the following:    B Natriuretic Peptide 3582.0 (*)    All other components within normal limits  CBC WITH DIFFERENTIAL/PLATELET - Abnormal; Notable for the following:    RDW 16.3 (*)     All other components within normal limits  COMPREHENSIVE METABOLIC PANEL - Abnormal; Notable for the following:    Glucose, Bld 151 (*)    BUN 29 (*)    Creatinine, Ser 1.47 (*)    Total Protein 6.4 (*)    Albumin 3.3 (*)    Alkaline Phosphatase 260 (*)    Total Bilirubin 1.3 (*)    GFR calc non Af Amer 33 (*)    GFR calc Af Amer 39 (*)    All other components within normal limits  TROPONIN I    Imaging Review Dg Chest 2 View  10/01/2015  CLINICAL DATA:  Short of breath and cough EXAM: CHEST  2 VIEW COMPARISON:  09/01/2015 FINDINGS: Cardiac enlargement. Pulmonary vascular congestion. Improving edema since the prior study. No significant effusion. Negative for pneumonia. IMPRESSION: Improving congestive heart failure with decreased  interstitial edema. Electronically Signed   By: Franchot Gallo M.D.   On: 10/01/2015 17:22   I have personally reviewed and evaluated these images and lab results as part of my medical decision-making.   EKG Interpretation   Date/Time:  Thursday October 01 2015 16:45:10 EDT Ventricular Rate:  71 PR Interval:  173 QRS Duration: 139 QT Interval:  407 QTC Calculation: 442 R Axis:   93 Text Interpretation:  Sinus rhythm Right bundle branch block similar to  04/09/2015 Confirmed by Lachlan Mckim MD, Quillian Quince [phone]) on 10/01/2015 4:48:05 PM      MDM   Final diagnoses:  Congestive heart failure, unspecified congestive heart failure chronicity, unspecified congestive heart failure type ([place])  Asthma exacerbation    79 yo F with a chief complaint shortness of breath. Patient with a mixed picture with history of COPD as well as CHF. BNP mildly elevated above baseline. On exam patient with JVD to mid neck as well as edema to the bilateral lower extremities. Patient feels like this is her baseline. Denies weight gain. Chest x-ray with improving interstitial edema compared to prior. Patient was given 2 DuoNeb's and feels much better. We'll give a dose of steroids,  lasix.  Troponins negative. EKG unchanged. Have her follow with her PCP in 2 days.  6:12 PM:  I have discussed the diagnosis/risks/treatment options with the patient and believe the pt to be eligible for discharge home to follow-up with PCP. We also discussed returning to the ED immediately if new or worsening sx occur. We discussed the sx which are most concerning (e.g., sudden worsening sob, fever) that necessitate immediate return. Medications administered to the patient during their visit and any new prescriptions provided to the patient are listed below.  Medications given during this visit Medications  furosemide (LASIX) injection 40 mg (not administered)  predniSONE (DELTASONE) tablet 40 mg (not administered)  ipratropium-albuterol (DUONEB) 0.5-2.5 (3) MG/3ML nebulizer solution 3 mL (3 mLs Nebulization Given by Other 10/01/15 1722)    New Prescriptions   PREDNISONE (DELTASONE) 20 MG TABLET    2 tabs po daily x 3 days    The patient appears reasonably screen and/or stabilized for discharge and I doubt any other medical condition or other [facility] requiring further screening, evaluation, or treatment in the ED at this time prior to discharge.     Deno Etienne, DO 10/01/15 [phone]

## 2015-10-01 NOTE — ED Notes (Signed)
Respiratory paged

## 2015-10-01 NOTE — Discharge Instructions (Signed)
Asthma, Adult Asthma is a condition of the lungs in which the airways tighten and narrow. Asthma can make it hard to breathe. Asthma cannot be cured, but medicine and lifestyle changes can help control it. Asthma may be started (triggered) by:  Animal skin flakes (dander).  Dust.  Cockroaches.  Pollen.  Mold.  Smoke.  Cleaning products.  Hair sprays or aerosol sprays.  Paint fumes or strong smells.  Cold air, weather changes, and winds.  Crying or laughing hard.  Stress.  Certain medicines or drugs.  Foods, such as dried fruit, potato chips, and sparkling grape juice.  Infections or conditions (colds, flu).  Exercise.  Certain medical conditions or diseases.  Exercise or tiring activities. HOME CARE   Take medicine as told by your doctor.  Use a peak flow meter as told by your doctor. A peak flow meter is a tool that measures how well the lungs are working.  Record and keep track of the peak flow meter's readings.  Understand and use the asthma action plan. An asthma action plan is a written plan for taking care of your asthma and treating your attacks.  To help prevent asthma attacks:  Do not smoke. Stay away from secondhand smoke.  Change your heating and air conditioning filter often.  Limit your use of fireplaces and wood stoves.  Get rid of pests (such as roaches and mice) and their droppings.  Throw away plants if you see mold on them.  Clean your floors. Dust regularly. Use cleaning products that do not smell.  Have someone vacuum when you are not home. Use a vacuum cleaner with a HEPA filter if possible.  Replace carpet with wood, tile, or vinyl flooring. Carpet can trap animal skin flakes and dust.  Use allergy-proof pillows, mattress covers, and box spring covers.  Wash bed sheets and blankets every week in hot water and dry them in a dryer.  Use blankets that are made of polyester or cotton.  Clean bathrooms and kitchens with bleach.  If possible, have someone repaint the walls in these rooms with mold-resistant paint. Keep out of the rooms that are being cleaned and painted.  Wash hands often. GET HELP IF:  You have make a whistling sound when breaking (wheeze), have shortness of breath, or have a cough even if taking medicine to prevent attacks.  The colored mucus you cough up (sputum) is thicker than usual.  The colored mucus you cough up changes from clear or white to yellow, green, gray, or bloody.  You have problems from the medicine you are taking such as:  A rash.  Itching.  Swelling.  Trouble breathing.  You need reliever medicines more than 2-3 times a week.  Your peak flow measurement is still at 50-79% of your personal best after following the action plan for 1 hour.  You have a fever. GET HELP RIGHT AWAY IF:   You seem to be worse and are not responding to medicine during an asthma attack.  You are short of breath even at rest.  You get short of breath when doing very little activity.  You have trouble eating, drinking, or talking.  You have chest pain.  You have a fast heartbeat.  Your lips or fingernails start to turn blue.  You are light-headed, dizzy, or faint.  Your peak flow is less than 50% of your personal best.   This information is not intended to replace advice given to you by your health care provider. Make sure   you discuss any questions you have with your health care provider.   Document Released: 05/16/2008 Document Revised: 08/19/2015 Document Reviewed: 06/27/2013 Elsevier Interactive Patient Education 2016 Elsevier Inc.  

## 2015-10-01 NOTE — ED Notes (Signed)
Patient's chart states she does have Asthma and patient reports having inhaler and has used it today.

## 2015-10-06 ENCOUNTER — Other Ambulatory Visit (HOSPITAL_COMMUNITY)
Admission: RE | Admit: 2015-10-06 | Discharge: 2015-10-06 | Disposition: A | Discharge status: Home / Self Care | Payer: Medicare Other | Source: Ambulatory Visit | Attending: Family Medicine | Admitting: Family Medicine

## 2015-10-06 DIAGNOSIS — E119 Type 2 diabetes mellitus without complications: Secondary | ICD-10-CM | POA: Insufficient documentation

## 2015-10-06 DIAGNOSIS — Z79899 Other long term (current) drug therapy: Secondary | ICD-10-CM | POA: Insufficient documentation

## 2015-10-06 DIAGNOSIS — I43 Cardiomyopathy in diseases classified elsewhere: Secondary | ICD-10-CM | POA: Diagnosis present

## 2015-10-06 DIAGNOSIS — I5043 Acute on chronic combined systolic (congestive) and diastolic (congestive) heart failure: Secondary | ICD-10-CM | POA: Diagnosis not present

## 2015-10-06 LAB — BASIC METABOLIC PANEL
Anion gap: 8 (ref 5–15)
BUN: 36 mg/dL — ABNORMAL HIGH (ref 6–20)
CO2: 25 mmol/L (ref 22–32)
Calcium: 9 mg/dL (ref 8.9–10.3)
Chloride: 108 mmol/L (ref 101–111)
Creatinine, Ser: 1.35 mg/dL — ABNORMAL HIGH (ref 0.44–1.00)
GFR calc Af Amer: 43 mL/min — ABNORMAL LOW (ref >60)
GFR calc non Af Amer: 37 mL/min — ABNORMAL LOW (ref >60)
Glucose, Bld: 253 mg/dL — ABNORMAL HIGH (ref 65–99)
Potassium: 3.8 mmol/L (ref 3.5–5.1)
Sodium: 141 mmol/L (ref 135–145)

## 2015-10-13 ENCOUNTER — Other Ambulatory Visit: Payer: Self-pay | Admitting: *Deleted

## 2015-10-13 NOTE — Patient Outreach (Signed)
[facility]) Care Management  10/13/2015  [name] Oct 31, 1936 [id number]   Patient triggered RED on EMMI Heart Failure Dashboard, assigned Sherrin Daisy, RN to outreach for [name] services.  Thanks, [name]. [name], [name] Assistant Phone: [phone] Fax: [phone]

## 2015-10-13 NOTE — Patient Outreach (Signed)
[facility]) Care Management  10/13/2015  [name] 25-Aug-1936 [id number]  EMMI-Heart failure referral:  Telephone call to patient; left message requesting return call.  Plan: Will follow up. Sherrin Daisy, RN BSN [place] Management Coordinator [facility] Care Management  [phone]

## 2015-10-14 ENCOUNTER — Other Ambulatory Visit: Payer: Medicare Other | Admitting: *Deleted

## 2015-10-14 NOTE — Patient Outreach (Signed)
[facility]) Care Management  10/14/2015  [name] 12-Oct-1936 [id number]   Telephone call attempt x 2 ; left HIPPA compliant voice mail requesting return call.   Plan: will follow up. Sherrin Daisy, RN BSN [place] Management Coordinator [facility] Care Management  [phone]

## 2015-10-15 ENCOUNTER — Other Ambulatory Visit: Payer: Self-pay | Admitting: *Deleted

## 2015-10-15 NOTE — Patient Outreach (Signed)
[facility]) Care Management  10/15/2015  [name] [birth year] [id number]   Telephone attempt x 3 -no answer unable to leave message. Telephone call to alternate number-grand daughter answered call and advised she have patient return my call.  Plan: will follow up. Sherrin Daisy, RN BSN [place] Management Coordinator [facility] Care Management  [phone]

## 2015-10-16 ENCOUNTER — Other Ambulatory Visit: Payer: Self-pay | Admitting: *Deleted

## 2015-10-16 NOTE — Patient Outreach (Signed)
[facility]) Care Management  10/16/2015  RAISSA DAM Feb 20, 1936 [id number]  EMMI-Heart failure referral: Received incoming call from patient after leaving message. HIPPA verification received. Patient voices that she recently moved and different address which is [address] [place], [place]. [address].  Subjective:  Patient voices that she was recently in hospital with heart failure and bronchitis. States she does get tired quicker now than usual. States has some swelling in feet and legs but better than when she went to hospital. Has seen primary care doctor twice since she was discharged from hospital. State she is taking fluid pill and other medications as prescribed by doctor.  Patient states she does get some shortness of breath when moving around.  Daughter-Margaret was present and spoke with RN case Freight forwarder. States she prepares patient's medications and sees her daily. States next appointment for patient is with heart doctor 10/26/2015. States patient is never left alone and she has caregiver that works Careers adviser daily from Monday to Friday. States family members are with her at other times. States patient has workable scales and she encourage patient to weigh daily. Patient does not know her current weight but states she has lost weight since being in hospital. Daughter was advised of signs and symptoms of heart failure and appropriate action plan. She voices understanding of calling doctor or 911 as determined by symptoms.    Objective:  See medications as noted. See screening assessment.   Assessment: Has family and caregiver support. Has transportation to appointments. Medication being managed by daughter. No acute/worsening  problems today.  Plan: Will close case. Sherrin Daisy, RN BSN [place] Management Coordinator [facility] Care Management  [phone]

## 2015-10-26 ENCOUNTER — Encounter: Payer: Self-pay | Admitting: Cardiology

## 2015-10-26 ENCOUNTER — Ambulatory Visit (INDEPENDENT_AMBULATORY_CARE_PROVIDER_SITE_OTHER): Payer: Medicare Other | Admitting: Cardiology

## 2015-10-26 VITALS — BP 128/76 | HR 79 | Ht 65.0 in | Wt 173.6 lb

## 2015-10-26 DIAGNOSIS — I2510 Atherosclerotic heart disease of native coronary artery without angina pectoris: Secondary | ICD-10-CM

## 2015-10-26 DIAGNOSIS — I428 Other cardiomyopathies: Secondary | ICD-10-CM

## 2015-10-26 DIAGNOSIS — I10 Essential (primary) hypertension: Secondary | ICD-10-CM | POA: Diagnosis not present

## 2015-10-26 DIAGNOSIS — I5043 Acute on chronic combined systolic (congestive) and diastolic (congestive) heart failure: Secondary | ICD-10-CM

## 2015-10-26 DIAGNOSIS — I429 Cardiomyopathy, unspecified: Secondary | ICD-10-CM | POA: Diagnosis not present

## 2015-10-26 MED ORDER — POTASSIUM CHLORIDE CRYS ER 20 MEQ PO TBCR: 20.0000 meq | EXTENDED_RELEASE_TABLET | Freq: Every day | ORAL | Status: DC

## 2015-10-26 MED ORDER — FUROSEMIDE 40 MG PO TABS: 40.0000 mg | ORAL_TABLET | Freq: Two times a day (BID) | ORAL | Status: DC

## 2015-10-26 MED ORDER — METOLAZONE 2.5 MG PO TABS: 2.5000 mg | ORAL_TABLET | Freq: Every day | ORAL | Status: DC

## 2015-10-26 NOTE — Patient Instructions (Addendum)
Your physician recommends that you schedule a follow-up appointment in: 1 week with Arnold Long NP  INCREASE Lasix to 40 mg twice a day   START Potassium to 20 meq daily   START Metolazone 2.5 mg every morning    GET LAB WORK DONE ON Monday MORNING November 21 st   ( BMET)   If you need a refill on your cardiac medications before your next appointment, please call your pharmacy.      Thank you for choosing [facility] !

## 2015-10-26 NOTE — Progress Notes (Signed)
Cardiology Office Note  Date: 10/26/2015   ID: [name], DOB Mar 12, 1936, MRN [id number]  PCP: Lanette Hampshire, MD  Primary Cardiologist: Rozann Lesches, MD   Chief Complaint  Patient presents with  . Cardiomyopathy    History of Present Illness: [name] is a 79 y.o. female last seen by [name] NP in November 2015. She comes in today for a follow-up cardiac visit. Record review finds hospitalization in September of this year with acute on chronic combined heart failure. She improved with IV diuresis and was managed by Dr. Everette Rank. She states that since her hospital stay, she has gradually gained back fluid weight, she is up nearly 20 pounds by our scales. She was discharged home on Lasix 20 mg daily, since then it has been increased to 40 mg the morning and 20 mg in the afternoon, at least since October. This has not been effective.  She reports NYHA class 2-3 dyspnea, no palpitations, syncope, or angina.  We went over her remaining medications. She reports compliance with regimen including Coreg, Imdur, and lisinopril.  Today we discussed advancing her diuretic regimen further and arranging close office follow-up. She is not on a potassium supplement.   Past Medical History  Diagnosis Date  . Nonischemic cardiomyopathy (HCC)     LVEF 20-25%  . Asthma   . History of stroke   . Essential hypertension, benign   . Coronary atherosclerosis of native coronary artery     Nonobstructive at cath 2005  . Hypothyroidism   . Carotid artery disease ([place])   . Gout   . Renal artery stenosis (HCC)     Bilateral  . Bronchitis   . DM2 (diabetes mellitus, type 2) ([place])   . Influenza 2013    Past Surgical History  Procedure Laterality Date  . Abdominal hysterectomy      Current Outpatient Prescriptions  Medication Sig Dispense Refill  . albuterol (PROVENTIL HFA;VENTOLIN HFA) 108 (90 BASE) MCG/ACT inhaler Inhale 2 puffs into the lungs every 4 (four) hours as  needed for wheezing or shortness of breath.    Marland Kitchen aspirin EC 81 MG EC tablet Take 1 tablet (81 mg total) by mouth daily. 30 tablet 5  . carvedilol (COREG) 6.25 MG tablet Take 1 tablet (6.25 mg total) by mouth 2 (two) times daily with a meal. 60 tablet 5  . isosorbide mononitrate (IMDUR) 30 MG 24 hr tablet Take 0.5 tablets (15 mg total) by mouth daily. 30 tablet 5  . lisinopril (PRINIVIL,ZESTRIL) 5 MG tablet Take 1 tablet (5 mg total) by mouth daily. 30 tablet 5  . meclizine (ANTIVERT) 50 MG tablet Take 0.5 tablets (25 mg total) by mouth 3 (three) times daily as needed for dizziness. 30 tablet 0  . metFORMIN (GLUCOPHAGE) 500 MG tablet Take 500 mg by mouth 2 (two) times daily.     . predniSONE (DELTASONE) 20 MG tablet 2 tabs po daily x 3 days 6 tablet 0  . furosemide (LASIX) 40 MG tablet Take 1 tablet (40 mg total) by mouth 2 (two) times daily. 180 tablet 3  . metolazone (ZAROXOLYN) 2.5 MG tablet Take 1 tablet (2.5 mg total) by mouth daily. 90 tablet 3  . potassium chloride SA (KLOR-CON M20) 20 MEQ tablet Take 1 tablet (20 mEq total) by mouth daily. 90 tablet 3   No current facility-administered medications for this visit.    Allergies:  Review of patient's allergies indicates no known allergies.   Social History: The patient  reports that she has never smoked. She has never used smokeless tobacco. She reports that she does not drink alcohol or use illicit drugs.   ROS:  Please see the history of present illness. Otherwise, complete review of systems is positive for exertional fatigue.  All other systems are reviewed and negative.   Physical Exam: VS:  BP 128/76 mmHg  Pulse 79  Ht 5\' 5"  (1.651 m)  Wt 173 lb 9.6 oz (78.744 kg)  BMI 28.89 kg/m2  SpO2 97%, BMI Body mass index is 28.89 kg/(m^2).  Wt Readings from Last 3 Encounters:  10/26/15 173 lb 9.6 oz (78.744 kg)  10/01/15 151 lb (68.493 kg)  09/04/15 151 lb 14 oz (68.89 kg)     General: Patient appears comfortable at rest. HEENT:  Conjunctiva and lids normal, oropharynx clear with moist mucosa. Neck: Supple, elevated JVP or carotid bruits, no thyromegaly. Lungs: Clear to auscultation, nonlabored breathing at rest. Cardiac: Regular rate and rhythm, S3 noted, soft significant systolic murmur, no pericardial rub. Abdomen: Soft, nontender, bowel sounds present, no guarding or rebound. Extremities: 2-3+ edema, distal pulses 2+. Skin: Warm and dry. Musculoskeletal: No kyphosis. Neuropsychiatric: Alert and oriented x3, affect grossly appropriate.   ECG: Tracing from 10/02/2015 showed sinus rhythm with right bundle branch block and nonspecific ST changes.  Recent Labwork: 09/01/2015: TSH 2.249 10/01/2015: ALT 27; AST 31; B Natriuretic Peptide 3582.0*; Hemoglobin 12.3; Platelets 213 10/06/2015: BUN 36*; Creatinine, Ser 1.35*; Potassium 3.8; Sodium 141     Component Value Date/Time   CHOL 139 07/03/2015 0652   TRIG 151* 07/03/2015 0652   HDL 35* 07/03/2015 0652   CHOLHDL 4.0 07/03/2015 0652   VLDL 30 07/03/2015 0652   LDLCALC 74 07/03/2015 0652    Other Studies Reviewed Today:  Echocardiogram 07/02/2015: Study Conclusions  - Left ventricle: The cavity size was normal. Wall thickness was increased in a pattern of moderate LVH. Systolic function was severely reduced. The estimated ejection fraction was in the range of 20% to 25%. Diffuse hypokinesis. Features are consistent with a pseudonormal left ventricular filling pattern, with concomitant abnormal relaxation and increased filling pressure (grade 2 diastolic dysfunction). - Aortic valve: Mildly calcified annulus. Trileaflet; mildly thickened leaflets. Valve area (VTI): 2.18 cm^2. Valve area (Vmax): 2.21 cm^2. - Mitral valve: Mildly calcified annulus. Mildly thickened leaflets . There was moderate regurgitation. The MR vena contracta is 0.4 cm. - Left atrium: The atrium was severely dilated. - Right ventricle: The interventricular septum  is flattened in systole consistent with RV pressure overload. The cavity size was mildly dilated. Systolic function was moderately reduced. - Right atrium: The atrium was mildly dilated. - Systemic veins: The IVC is dilated with normal respiratory variation, estimated RA pressure is 8 mmHg. - Technically adequate study.   ASSESSMENT AND PLAN:  1. Acute on chronic combined heart failure with volume overload. Plan is to increase Lasix to 40 mg twice daily and add Zaroxolyn 2.5 mg daily. She will also be placed on KCl 20 mEq daily. Follow-up arranged in one week with BMET.  2. Nonischemic cardiomyopathy, LVEF 20-25% with grade 2 diastolic dysfunction. She also has RV dysfunction.  3. Essential hypertension, blood pressure is adequately controlled today.  4. History of nonobstructive CAD based on previous evaluation in 2005. No active angina symptoms.  Current medicines were reviewed at length with the patient today.  Disposition: FU in 1 week.   Signed, Satira Sark, MD, [facility] 10/26/2015 2:03 PM    [place] at  [place] [address] [place], [place] [address] Phone: [phone]; Fax: [phone]

## 2015-10-27 ENCOUNTER — Telehealth: Payer: Self-pay | Admitting: Adult Health

## 2015-10-27 NOTE — Telephone Encounter (Signed)
Spoke to pt's daughter, and let her know that her mother needs to take all medications plus the ones we started yesterday.

## 2015-10-27 NOTE — Telephone Encounter (Signed)
Pt would like someone to call her and let her know if she continues taking her old medication w/ her new meds she got yesterday

## 2015-11-02 ENCOUNTER — Telehealth: Payer: Self-pay

## 2015-11-02 ENCOUNTER — Ambulatory Visit (INDEPENDENT_AMBULATORY_CARE_PROVIDER_SITE_OTHER): Payer: Medicare Other | Admitting: Adult Health

## 2015-11-02 ENCOUNTER — Encounter: Payer: Self-pay | Admitting: Adult Health

## 2015-11-02 VITALS — BP 100/64 | HR 87 | Ht 62.0 in | Wt 149.0 lb

## 2015-11-02 DIAGNOSIS — I5041 Acute combined systolic (congestive) and diastolic (congestive) heart failure: Secondary | ICD-10-CM | POA: Diagnosis not present

## 2015-11-02 DIAGNOSIS — I10 Essential (primary) hypertension: Secondary | ICD-10-CM

## 2015-11-02 DIAGNOSIS — I255 Ischemic cardiomyopathy: Secondary | ICD-10-CM

## 2015-11-02 LAB — BASIC METABOLIC PANEL
BUN: 41 mg/dL — ABNORMAL HIGH (ref 7–25)
CO2: 32 mmol/L — ABNORMAL HIGH (ref 20–31)
Calcium: 9.9 mg/dL (ref 8.6–10.4)
Chloride: 94 mmol/L — ABNORMAL LOW (ref 98–110)
Creat: 2.2 mg/dL — ABNORMAL HIGH (ref 0.60–0.93)
Glucose, Bld: 140 mg/dL — ABNORMAL HIGH (ref 65–99)
Potassium: 4.1 mmol/L (ref 3.5–5.3)
Sodium: 140 mmol/L (ref 135–146)

## 2015-11-02 MED ORDER — METOLAZONE 2.5 MG PO TABS: ORAL_TABLET | ORAL | Status: DC

## 2015-11-02 NOTE — Progress Notes (Deleted)
Name: [name]    DOB: October 09, 1936  Age: 79 y.o.  MR#: [id number]       PCP:  Lanette Hampshire, MD      Insurance: Payor: [employer] MEDICARE / Plan: [facility] MEDICARE / Product Type: *No Product type* /   CC:   No chief complaint on file.   VS Filed Vitals:   11/02/15 1511  BP: 100/64  Pulse: 87  Height: 5\' 2"  (1.575 m)  Weight: 149 lb (67.586 kg)  SpO2: 95%    Weights Current Weight  11/02/15 149 lb (67.586 kg)  10/26/15 173 lb 9.6 oz (78.744 kg)  10/01/15 151 lb (68.493 kg)    Blood Pressure  BP Readings from Last 3 Encounters:  11/02/15 100/64  10/26/15 128/76  10/01/15 152/101     Admit date:  (Not on file) Last encounter with RMR:  10/27/2015   Allergy Review of patient's allergies indicates no known allergies.  Current Outpatient Prescriptions  Medication Sig Dispense Refill  . albuterol (PROVENTIL HFA;VENTOLIN HFA) 108 (90 BASE) MCG/ACT inhaler Inhale 2 puffs into the lungs every 4 (four) hours as needed for wheezing or shortness of breath.    Marland Kitchen aspirin EC 81 MG EC tablet Take 1 tablet (81 mg total) by mouth daily. 30 tablet 5  . carvedilol (COREG) 6.25 MG tablet Take 1 tablet (6.25 mg total) by mouth 2 (two) times daily with a meal. 60 tablet 5  . furosemide (LASIX) 40 MG tablet Take 1 tablet (40 mg total) by mouth 2 (two) times daily. 180 tablet 3  . isosorbide mononitrate (IMDUR) 30 MG 24 hr tablet Take 0.5 tablets (15 mg total) by mouth daily. 30 tablet 5  . lisinopril (PRINIVIL,ZESTRIL) 5 MG tablet Take 1 tablet (5 mg total) by mouth daily. 30 tablet 5  . meclizine (ANTIVERT) 50 MG tablet Take 0.5 tablets (25 mg total) by mouth 3 (three) times daily as needed for dizziness. 30 tablet 0  . metFORMIN (GLUCOPHAGE) 500 MG tablet Take 500 mg by mouth 2 (two) times daily.     . metolazone (ZAROXOLYN) 2.5 MG tablet Take 1 tablet (2.5 mg total) by mouth daily. 90 tablet 3  . potassium chloride SA (KLOR-CON M20) 20 MEQ tablet Take 1 tablet (20 mEq total) by  mouth daily. 90 tablet 3  . predniSONE (DELTASONE) 20 MG tablet 2 tabs po daily x 3 days 6 tablet 0   No current facility-administered medications for this visit.    Discontinued Meds:   There are no discontinued medications.  Patient Active Problem List   Diagnosis Date Noted  . Acute exacerbation of CHF (congestive heart failure) ([place]) 09/01/2015  . CHF (congestive heart failure) ([place]) 07/02/2015  . Elevated troponin I level 07/02/2015  . Right bundle branch block 07/02/2015  . Renal insufficiency 07/02/2015  . CAP (community acquired pneumonia) 04/25/2015  . Acute respiratory failure with hypoxia ([place]) 04/25/2015  . Acute renal injury ([place]) 04/25/2015  . DM2 (diabetes mellitus, type 2) ([place]) 04/25/2015  . ARF (acute renal failure) ([place]) 06/18/2014  . Mitral regurgitation 06/09/2014  . Dyspnea 06/06/2014  . Asthma exacerbation 06/06/2014  . Acute on chronic combined systolic and diastolic CHF (congestive heart failure) ([place]) 06/06/2014  . Influenza 12/10/2012  . HYPERLIPIDEMIA 07/08/2010  . Essential hypertension 04/06/2010  . CORONARY ATHEROSCLEROSIS NATIVE CORONARY ARTERY 04/06/2010  . CARDIOMYOPATHY 03/29/2010    LABS    Component Value Date/Time   NA 141 10/06/2015 1750   NA 141 10/01/2015 1652  NA 141 09/04/2015 0639   K 3.8 10/06/2015 1750   K 4.0 10/01/2015 1652   K 4.1 09/04/2015 0639   CL 108 10/06/2015 1750   CL 111 10/01/2015 1652   CL 108 09/04/2015 0639   CO2 25 10/06/2015 1750   CO2 23 10/01/2015 1652   CO2 27 09/04/2015 0639   GLUCOSE 253* 10/06/2015 1750   GLUCOSE 151* 10/01/2015 1652   GLUCOSE 119* 09/04/2015 0639   BUN 36* 10/06/2015 1750   BUN 29* 10/01/2015 1652   BUN 34* 09/04/2015 0639   CREATININE 1.35* 10/06/2015 1750   CREATININE 1.47* 10/01/2015 1652   CREATININE 1.81* 09/04/2015 0639   CREATININE 1.09 12/20/2012 0800   CREATININE 1.48* 12/10/2012 1204   CALCIUM 9.0 10/06/2015 1750   CALCIUM 9.3 10/01/2015 1652   CALCIUM 8.7*  09/04/2015 0639   GFRNONAA 37* 10/06/2015 1750   GFRNONAA 33* 10/01/2015 1652   GFRNONAA 26* 09/04/2015 0639   GFRAA 43* 10/06/2015 1750   GFRAA 39* 10/01/2015 1652   GFRAA 30* 09/04/2015 0639   CMP     Component Value Date/Time   NA 141 10/06/2015 1750   K 3.8 10/06/2015 1750   CL 108 10/06/2015 1750   CO2 25 10/06/2015 1750   GLUCOSE 253* 10/06/2015 1750   BUN 36* 10/06/2015 1750   CREATININE 1.35* 10/06/2015 1750   CREATININE 1.09 12/20/2012 0800   CALCIUM 9.0 10/06/2015 1750   PROT 6.4* 10/01/2015 1652   ALBUMIN 3.3* 10/01/2015 1652   AST 31 10/01/2015 1652   ALT 27 10/01/2015 1652   ALKPHOS 260* 10/01/2015 1652   BILITOT 1.3* 10/01/2015 1652   GFRNONAA 37* 10/06/2015 1750   GFRAA 43* 10/06/2015 1750       Component Value Date/Time   WBC 5.3 10/01/2015 1652   WBC 5.7 09/02/2015 0515   WBC 7.1 09/01/2015 2041   HGB 12.3 10/01/2015 1652   HGB 12.1 09/02/2015 0515   HGB 13.5 09/01/2015 2041   HCT 39.3 10/01/2015 1652   HCT 38.7 09/02/2015 0515   HCT 41.7 09/01/2015 2041   MCV 86.0 10/01/2015 1652   MCV 85.8 09/02/2015 0515   MCV 84.9 09/01/2015 2041    Lipid Panel     Component Value Date/Time   CHOL 139 07/03/2015 0652   TRIG 151* 07/03/2015 0652   HDL 35* 07/03/2015 0652   CHOLHDL 4.0 07/03/2015 0652   VLDL 30 07/03/2015 0652   LDLCALC 74 07/03/2015 0652    ABG No results found for: PHART, PCO2ART, PO2ART, HCO3, TCO2, ACIDBASEDEF, O2SAT   Lab Results  Component Value Date   TSH 2.249 09/01/2015   BNP (last 3 results)  Recent Labs  07/02/15 0635 09/01/15 2041 10/01/15 1653  BNP 2948.0* 2425.0* 3582.0*    ProBNP (last 3 results) No results for input(s): PROBNP in the last 8760 hours.  Cardiac Panel (last 3 results) No results for input(s): CKTOTAL, CKMB, TROPONINI, RELINDX in the last 72 hours.  Iron/TIBC/Ferritin/ %Sat No results found for: IRON, TIBC, FERRITIN, IRONPCTSAT   EKG Orders placed or performed during the hospital  encounter of 10/01/15  . ED EKG  . ED EKG  . EKG 12-Lead  . EKG 12-Lead  . EKG     Prior Assessment and Plan Problem List as of 11/02/2015      Cardiovascular and Mediastinum   Essential hypertension   Last Assessment & Plan 10/28/2014 Office Visit Written 10/28/2014  4:43 PM by Lendon Colonel, NP    Initially elevated on first  check in triage. I rechecked it in clinic and found it to be 138/88. Will continue current medication regimen.       CORONARY ATHEROSCLEROSIS NATIVE CORONARY ARTERY   Last Assessment & Plan 10/28/2014 Office Visit Written 10/28/2014  4:42 PM by Lendon Colonel, NP    I do not think transient sharp chest pain is related to CAD at this time.Last cath 3 1/2 years ago demonstrated non-obstructive disease.  She has recently gotten over pneumonia. I will recheck CXR as I am heariing some crackles in the right base. Check labs to evaluate potassium status.  If pain is recurrent, I will plan for NM stress test.       CARDIOMYOPATHY   Last Assessment & Plan 12/10/2012 Office Visit Written 12/10/2012 12:11 PM by Lendon Colonel, NP    She has no evidence of CHF on this assessment. Her chest is clear despite her complaints of coughing and congestion. Last echo completed in Sept of 2013, EF of 40-45%. Continue coreg and lisinopril.      Acute on chronic combined systolic and diastolic CHF (congestive heart failure) [facility])   Last Assessment & Plan 10/28/2014 Office Visit Written 10/28/2014  4:44 PM by Lendon Colonel, NP    No evidence of fluid overload currently. Continue current mediations. Checking BMET.      Mitral regurgitation   CHF (congestive heart failure) (HCC)   Right bundle branch block   Acute exacerbation of CHF (congestive heart failure) [facility])     Respiratory   Influenza   Last Assessment & Plan 12/10/2012 Office Visit Written 12/10/2012 12:13 PM by Lendon Colonel, NP    This diagnosis is yet to be confirmed. She has a fever of 100.3  with body aches, chills and congestion. She was seen in the ER two days ago,but no labs were completed. I will have CBC and influenza panel drawn. She is given a face mask to wear. She is encouraged to follow up with Dr. Everette Rank today. Labs should be available to him by the time she sees him.       Asthma exacerbation   CAP (community acquired pneumonia)   Acute respiratory failure with hypoxia ([place])     Endocrine   DM2 (diabetes mellitus, type 2) ([place])     Genitourinary   ARF (acute renal failure) ([place])   Acute renal injury ([place])   Renal insufficiency     Other   HYPERLIPIDEMIA   Last Assessment & Plan 09/07/2012 Office Visit Written 09/07/2012  1:20 PM by Satira Sark, MD    Will start simvastatin generic and check FLP in 2 months.      Dyspnea   Elevated troponin I level       Imaging: No results found.

## 2015-11-02 NOTE — Telephone Encounter (Signed)
[place]: Finalresult Visible to patient:  Not Released Nextappt: 02/03/2016 at 11:40 AM in Cardiology Rozann Lesches, MD) Dx:  Acute on chronic combined systolic an...          Notes Recorded by Satira Sark, MD on 11/02/2015 at 3:44 PM Reviewed. Creatinine has increased from 1.3-2.2. Medications reviewed. I see that she saw [name] NP today for a clinical visit and had lost 25 pounds with diuresis. Would have her stop Zaroxolyn and use this only as needed for weight gain of 3 pounds in 24 hours. Otherwise stay on Lasix at 40 mg twice daily for now. She will need to have a follow-up BMET in 1 week.          Lm FOR PT TO CALL BACK

## 2015-11-02 NOTE — Patient Instructions (Signed)
Your physician wants you to follow-up in: 3 months with Jory Sims, NP.  You will receive a reminder letter in the mail two months in advance. If you don't receive a letter, please call our office to schedule the follow-up appointment.  Your physician recommends that you continue on your current medications as directed. Please refer to the Current Medication list given to you today.  If you need a refill on your cardiac medications before your next appointment, please call your pharmacy.  Thank you for choosing [facility]!

## 2015-11-02 NOTE — Progress Notes (Signed)
Cardiology Office Note   Date:  11/02/2015   ID:  Myia, Kegel 1936/08/15, MRN [id number]  PCP:  Lanette Hampshire, MD  Cardiologist: [place]/  Jory Sims, NP   No chief complaint on file.     History of Present Illness: [name] is a 79 y.o. female who presents for ongoing assessment and management of chronic combined CHF with NYHA class 2-3 symptoms.   Left ventricle: The cavity size was normal. Wall thickness was increased in a pattern of moderate LVH. Systolic function was severely reduced. The estimated ejection fraction was in the range of 20% to 25%. Diffuse hypokinesis. Features are consistent with a pseudonormal left ventricular filling pattern, with concomitant abnormal relaxation and increased filling pressure (grade 2 diastolic dysfunction). - Aortic valve: Mildly calcified annulus. Trileaflet; mildly thickened leaflets. Valve area (VTI): 2.18 cm^2. Valve area (Vmax): 2.21 cm^2. - Mitral valve: Mildly calcified annulus. Mildly thickened leaflets . There was moderate regurgitation. The MR vena contracta is 0.4 cm. - Left atrium: The atrium was severely dilated. - Right ventricle: The interventricular septum is flattened in systole consistent with RV pressure overload. The cavity size was mildly dilated. Systolic function was moderately reduced. - Right atrium: The atrium was mildly dilated. - Systemic veins: The IVC is dilated with normal respiratory variation, estimated RA pressure is 8 mmHg. - Technically adequate study.  She was started on increased dose of lasix 40 mg BID, and zaroxolyn 2.5 mg daily. She was given potassium supplement. BMET was ordered. Na: 141, K+ 3.8, Creatinine 1.35. Glucose 253.   She is doing well and has lost 25 lbs since being seen las with use of metolazone and increased dose of lasix. She denies chest pain, palpitations, dizziness or dyspnea.  She states she feels better.   Past Medical  History  Diagnosis Date  . Nonischemic cardiomyopathy (HCC)     LVEF 20-25%  . Asthma   . History of stroke   . Essential hypertension, benign   . Coronary atherosclerosis of native coronary artery     Nonobstructive at cath 2005  . Hypothyroidism   . Carotid artery disease ([place])   . Gout   . Renal artery stenosis (HCC)     Bilateral  . Bronchitis   . DM2 (diabetes mellitus, type 2) ([place])   . Influenza 2013    Past Surgical History  Procedure Laterality Date  . Abdominal hysterectomy       Current Outpatient Prescriptions  Medication Sig Dispense Refill  . albuterol (PROVENTIL HFA;VENTOLIN HFA) 108 (90 BASE) MCG/ACT inhaler Inhale 2 puffs into the lungs every 4 (four) hours as needed for wheezing or shortness of breath.    Marland Kitchen aspirin EC 81 MG EC tablet Take 1 tablet (81 mg total) by mouth daily. 30 tablet 5  . carvedilol (COREG) 6.25 MG tablet Take 1 tablet (6.25 mg total) by mouth 2 (two) times daily with a meal. 60 tablet 5  . furosemide (LASIX) 40 MG tablet Take 1 tablet (40 mg total) by mouth 2 (two) times daily. 180 tablet 3  . isosorbide mononitrate (IMDUR) 30 MG 24 hr tablet Take 0.5 tablets (15 mg total) by mouth daily. 30 tablet 5  . lisinopril (PRINIVIL,ZESTRIL) 5 MG tablet Take 1 tablet (5 mg total) by mouth daily. 30 tablet 5  . meclizine (ANTIVERT) 50 MG tablet Take 0.5 tablets (25 mg total) by mouth 3 (three) times daily as needed for dizziness. 30 tablet 0  . metFORMIN (GLUCOPHAGE) 500 MG  tablet Take 500 mg by mouth 2 (two) times daily.     . metolazone (ZAROXOLYN) 2.5 MG tablet Take 1 tablet (2.5 mg total) by mouth daily. 90 tablet 3  . potassium chloride SA (KLOR-CON M20) 20 MEQ tablet Take 1 tablet (20 mEq total) by mouth daily. 90 tablet 3  . predniSONE (DELTASONE) 20 MG tablet 2 tabs po daily x 3 days 6 tablet 0   No current facility-administered medications for this visit.    Allergies:   Review of patient's allergies indicates no known allergies.     Social History:  The patient  reports that she has never smoked. She has never used smokeless tobacco. She reports that she does not drink alcohol or use illicit drugs.   Family History:  The patient's family history includes Asthma in her son.    ROS: All other systems are reviewed and negative. Unless otherwise mentioned in H&P    PHYSICAL EXAM: VS:  BP 100/64 mmHg  Pulse 87  Ht 5\' 2"  (1.575 m)  Wt 149 lb (67.586 kg)  BMI 27.25 kg/m2  SpO2 95% , BMI Body mass index is 27.25 kg/(m^2). GEN: Well nourished, well developed, in no acute distress HEENT: normal Neck: no JVD, carotid bruits, or masses Cardiac: RRR; no murmurs, rubs, or gallops,no edema  Respiratory:  clear to auscultation bilaterally, normal work of breathing GI: soft, nontender, nondistended, + BS MS: no deformity or atrophySkin wrinkling from fluid diureses  Skin: warm and dry, no rash Neuro:  Strength and sensation are intact Psych: euthymic mood, full affect  Recent Labs: 09/01/2015: TSH 2.249 10/01/2015: ALT 27; B Natriuretic Peptide 3582.0*; Hemoglobin 12.3; Platelets 213 10/06/2015: BUN 36*; Creatinine, Ser 1.35*; Potassium 3.8; Sodium 141    Lipid Panel    Component Value Date/Time   CHOL 139 07/03/2015 0652   TRIG 151* 07/03/2015 0652   HDL 35* 07/03/2015 0652   CHOLHDL 4.0 07/03/2015 0652   VLDL 30 07/03/2015 0652   LDLCALC 74 07/03/2015 0652      Wt Readings from Last 3 Encounters:  11/02/15 149 lb (67.586 kg)  10/26/15 173 lb 9.6 oz (78.744 kg)  10/01/15 151 lb (68.493 kg)     ASSESSMENT AND PLAN:  1. Chronic Combined CHF: She has responded well to the lasix and metolazone. She has no complaints of cramping or palpitations. No near syncope. Will continue current medication regimen. I have discussed low sodium diet, daily wts and need to take extra lasix if she gains 3-5 lbs in 24 hours. She verbalizes understanding. Will see her in 3 months to continue evaluation.   2 Hypertension: BP  is well controlled and low normal in setting of low EF. Marland Kitchen She will continue ACE, coreg.   3. CAD:  No recurrent chest pain. Continue nitrates,    Current medicines are reviewed at length with the patient today.    Labs/ tests ordered today include:  No orders of the defined types were placed in this encounter.     Disposition:   FU with 3 months.    Signed, Jory Sims, NP  11/02/2015 3:22 PM    [facility] [address] [place], [place] [address] Phone: [phone]; Fax: [phone]

## 2015-11-12 ENCOUNTER — Other Ambulatory Visit: Payer: Self-pay | Admitting: Cardiology

## 2015-11-13 ENCOUNTER — Telehealth: Payer: Self-pay

## 2015-11-13 LAB — BASIC METABOLIC PANEL
BUN/Creatinine Ratio: 30 — ABNORMAL HIGH (ref 11–26)
BUN: 66 mg/dL — ABNORMAL HIGH (ref 8–27)
CO2: 23 mmol/L (ref 18–29)
Calcium: 10 mg/dL (ref 8.7–10.3)
Chloride: 96 mmol/L — ABNORMAL LOW (ref 97–106)
Creatinine, Ser: 2.19 mg/dL — ABNORMAL HIGH (ref 0.57–1.00)
GFR calc Af Amer: 25 mL/min/{1.73_m2} — ABNORMAL LOW (ref >59)
GFR calc non Af Amer: 21 mL/min/{1.73_m2} — ABNORMAL LOW (ref >59)
Glucose: 229 mg/dL — ABNORMAL HIGH (ref 65–99)
Potassium: 4.7 mmol/L (ref 3.5–5.2)
Sodium: 138 mmol/L (ref 136–144)

## 2015-11-13 MED ORDER — FUROSEMIDE 40 MG PO TABS: ORAL_TABLET | ORAL | Status: DC

## 2015-11-13 NOTE — Telephone Encounter (Signed)
-----   Message from Satira Sark, MD sent at 11/13/2015  7:58 AM EST ----- Reviewed. Check and see how she is doing as far as weight and fluid gain. She should have been off Zaroxolyn since the last creatinine was checked in November, although still on Lasix 40 mg twice daily. We may be able to reduce Lasix to 40 mg once daily on a standing basis and then use an extra Lasix per day for weight gain of 2-3 pounds in 24 hours.

## 2015-11-13 NOTE — Telephone Encounter (Signed)
I spoke with granddaughter Tobie Poet and she understands instructions,states pt's weight is doing well.

## 2016-02-02 ENCOUNTER — Emergency Department (HOSPITAL_COMMUNITY)
Admission: EM | Admit: 2016-02-02 | Discharge: 2016-02-02 | Disposition: A | Discharge status: Home / Self Care | Payer: Medicare Other | Attending: Emergency Medicine | Admitting: Emergency Medicine

## 2016-02-02 ENCOUNTER — Encounter (HOSPITAL_COMMUNITY): Payer: Self-pay

## 2016-02-02 DIAGNOSIS — I10 Essential (primary) hypertension: Secondary | ICD-10-CM | POA: Insufficient documentation

## 2016-02-02 DIAGNOSIS — M7989 Other specified soft tissue disorders: Secondary | ICD-10-CM | POA: Insufficient documentation

## 2016-02-02 DIAGNOSIS — M79642 Pain in left hand: Secondary | ICD-10-CM | POA: Diagnosis present

## 2016-02-02 DIAGNOSIS — E119 Type 2 diabetes mellitus without complications: Secondary | ICD-10-CM | POA: Diagnosis not present

## 2016-02-02 DIAGNOSIS — I2510 Atherosclerotic heart disease of native coronary artery without angina pectoris: Secondary | ICD-10-CM | POA: Insufficient documentation

## 2016-02-02 DIAGNOSIS — M109 Gout, unspecified: Secondary | ICD-10-CM | POA: Insufficient documentation

## 2016-02-02 DIAGNOSIS — Z7982 Long term (current) use of aspirin: Secondary | ICD-10-CM | POA: Insufficient documentation

## 2016-02-02 DIAGNOSIS — J45909 Unspecified asthma, uncomplicated: Secondary | ICD-10-CM | POA: Insufficient documentation

## 2016-02-02 DIAGNOSIS — Z79899 Other long term (current) drug therapy: Secondary | ICD-10-CM | POA: Insufficient documentation

## 2016-02-02 DIAGNOSIS — Z8673 Personal history of transient ischemic attack (TIA), and cerebral infarction without residual deficits: Secondary | ICD-10-CM | POA: Insufficient documentation

## 2016-02-02 DIAGNOSIS — M2550 Pain in unspecified joint: Secondary | ICD-10-CM

## 2016-02-02 DIAGNOSIS — M25532 Pain in left wrist: Secondary | ICD-10-CM | POA: Insufficient documentation

## 2016-02-02 DIAGNOSIS — M1990 Unspecified osteoarthritis, unspecified site: Secondary | ICD-10-CM | POA: Diagnosis not present

## 2016-02-02 MED ORDER — PREDNISONE 50 MG PO TABS
60.0000 mg | ORAL_TABLET | Freq: Once | ORAL | Status: AC
  Administered 2016-02-02: 60 mg via ORAL
  Filled 2016-02-02: qty 1

## 2016-02-02 MED ORDER — ACETAMINOPHEN 325 MG PO TABS
650.0000 mg | ORAL_TABLET | Freq: Once | ORAL | Status: AC
  Administered 2016-02-02: 650 mg via ORAL
  Filled 2016-02-02: qty 2

## 2016-02-02 MED ORDER — PREDNISONE 50 MG PO TABS: ORAL_TABLET | ORAL | Status: DC

## 2016-02-02 NOTE — ED Notes (Signed)
Pt c/o pain in left hand and left wrist.  Swelling noted over knuckles.  Denies injury.  Reports has arthritis.

## 2016-02-02 NOTE — ED Provider Notes (Signed)
CSN: [id number]     Arrival date & time 02/02/16  1131 History  By signing my name below, I, Stephania Fragmin, attest that this documentation has been prepared under the direction and in the presence of Ripley Fraise, MD. Electronically Signed: Stephania Fragmin, ED Scribe. 02/02/2016. 12:25 PM.    Chief Complaint  Patient presents with  . Hand Pain   The history is provided by the patient. No language interpreter was used.   HPI Comments: [name] is a 80 y.o. female with a history of arthritis, gout, and DM Type 2, who presents to the Emergency Department complaining of atraumatic, recurrent, sudden-onset, left wrist and hand swelling and pain that began last night. She denies any falls, trauma, or injuries. She notes a history of the same which she attributes due to arthritis. She states she last had a similar episode 2-3 months ago and at that time had seen her PCP Dr. Emilee Hero, but she did not receive any treatments or medications for her symptoms at that time. She has not tried anything for her current symptoms. She denies abdominal pain, nausea, vomiting, chest pain, back pain, leg or arm swelling.   Past Medical History  Diagnosis Date  . Nonischemic cardiomyopathy (HCC)     LVEF 20-25%  . Asthma   . History of stroke   . Essential hypertension, benign   . Coronary atherosclerosis of native coronary artery     Nonobstructive at cath 2005  . Hypothyroidism   . Carotid artery disease ([place])   . Gout   . Renal artery stenosis (HCC)     Bilateral  . Bronchitis   . DM2 (diabetes mellitus, type 2) ([place])   . Influenza 2013  . Stroke [facility])    Past Surgical History  Procedure Laterality Date  . Abdominal hysterectomy     Family History  Problem Relation Age of Onset  . Asthma Son    Social History  Substance Use Topics  . Smoking status: Never Smoker   . Smokeless tobacco: Never Used  . Alcohol Use: No   OB History    Gravida Para Term Preterm AB TAB SAB Ectopic Multiple  Living   10 10 10             Review of Systems  Cardiovascular: Negative for chest pain and leg swelling.  Gastrointestinal: Negative for nausea, vomiting and abdominal pain.  Musculoskeletal: Positive for joint swelling and arthralgias. Negative for back pain.  All other systems reviewed and are negative.     Allergies  Review of patient's allergies indicates no known allergies.  Home Medications   Prior to Admission medications   Medication Sig Start Date End Date Taking? Authorizing Provider  albuterol (PROVENTIL HFA;VENTOLIN HFA) 108 (90 BASE) MCG/ACT inhaler Inhale 2 puffs into the lungs every 4 (four) hours as needed for wheezing or shortness of breath.   Yes Historical Provider, MD  aspirin EC 81 MG EC tablet Take 1 tablet (81 mg total) by mouth daily. 07/06/15  Yes Angus McInnis, MD  carvedilol (COREG) 6.25 MG tablet Take 1 tablet (6.25 mg total) by mouth 2 (two) times daily with a meal. 07/06/15  Yes Angus McInnis, MD  furosemide (LASIX) 40 MG tablet Take Lasix 40 mg daily. MAY take additional 40 that day if she gains 2-3 lbs in a 24 hr period 11/13/15  Yes Satira Sark, MD  isosorbide mononitrate (IMDUR) 30 MG 24 hr tablet Take 0.5 tablets (15 mg total) by mouth daily. 07/06/15  Yes Angus McInnis, MD  lisinopril (PRINIVIL,ZESTRIL) 5 MG tablet Take 1 tablet (5 mg total) by mouth daily. 07/06/15  Yes Marjean Donna, MD  meclizine (ANTIVERT) 50 MG tablet Take 0.5 tablets (25 mg total) by mouth 3 (three) times daily as needed for dizziness. 08/16/15  Yes Forde Dandy, MD  metolazone (ZAROXOLYN) 2.5 MG tablet If you gain more than 3 lbs over night ,take one zaroxolyn   (2.5 mg) that day 11/02/15  Yes Satira Sark, MD  potassium chloride SA (KLOR-CON M20) 20 MEQ tablet Take 1 tablet (20 mEq total) by mouth daily. 10/26/15  Yes Satira Sark, MD  predniSONE (DELTASONE) 20 MG tablet 2 tabs po daily x 3 days Patient not taking: Reported on 02/02/2016 10/01/15   Deno Etienne, DO    BP 170/90 mmHg  Pulse 76  Temp(Src) 98.4 F (36.9 C) (Oral)  Resp 16  Wt 150 lb (68.04 kg)  SpO2 99% Physical Exam  Nursing note and vitals reviewed. CONSTITUTIONAL: Well developed/well nourished HEAD: Normocephalic/atraumatic EYES: EOMI/PERRL ENMT: Mucous membranes moist NECK: supple no meningeal signs CV: S1/S2 noted, no murmurs/rubs/gallops noted LUNGS: Lungs are clear to auscultation bilaterally, no apparent distress ABDOMEN: soft, nontender, no rebound or guarding, bowel sounds noted throughout abdomen NEURO: Pt is awake/alert/appropriate, moves all extremitiesx4.  No facial droop.   EXTREMITIES: pulses normal/equal, full ROM; mild tenderness and swelling to medial left wrist and proximal hand; no erythema, crepitus, or bruising SKIN: warm, color normal PSYCH: no abnormalities of mood noted, alert and oriented to situation   ED Course  Procedures   DIAGNOSTIC STUDIES: Oxygen Saturation is 99% on RA, normal by my interpretation.    COORDINATION OF CARE: 12:24 PM - Discussed treatment plan with pt at bedside which includes steroids qd for 4 days. Advised pt to return if her symptoms worsen or persist after that time. Pt verbalized understanding and agreed to plan.  Does not appear infection in nature No bruising or crepitus She can flex/extend at left wrist Due to underlying medical conditions, limited in medications Short course of prednisone, advise APAP and ortho f/u We discussed return precautions including worsened pain/redness/swelling to left hand/arm  Medications  predniSONE (DELTASONE) tablet 60 mg (60 mg Oral Given 02/02/16 1235)  acetaminophen (TYLENOL) tablet 650 mg (650 mg Oral Given 02/02/16 1235)     MDM   Final diagnoses:  Left hand pain  Arthralgia    Nursing notes including past medical history and social history reviewed and considered in documentation   I personally performed the services described in this documentation, which was scribed  in my presence. The recorded information has been reviewed and is accurate.       Ripley Fraise, MD 02/02/16 [phone]

## 2016-02-03 ENCOUNTER — Encounter: Payer: Self-pay | Admitting: Cardiology

## 2016-02-03 ENCOUNTER — Encounter: Payer: Medicare Other | Admitting: Cardiology

## 2016-02-03 NOTE — Progress Notes (Signed)
No show  This encounter was created in error - please disregard.

## 2016-06-19 ENCOUNTER — Emergency Department (HOSPITAL_COMMUNITY)
Admission: EM | Admit: 2016-06-19 | Discharge: 2016-06-19 | Disposition: A | Discharge status: Home / Self Care | Payer: Medicare Other | Attending: Emergency Medicine | Admitting: Emergency Medicine

## 2016-06-19 ENCOUNTER — Encounter (HOSPITAL_COMMUNITY): Payer: Self-pay | Admitting: Emergency Medicine

## 2016-06-19 ENCOUNTER — Emergency Department (HOSPITAL_COMMUNITY): Payer: Medicare Other

## 2016-06-19 DIAGNOSIS — I2510 Atherosclerotic heart disease of native coronary artery without angina pectoris: Secondary | ICD-10-CM | POA: Diagnosis not present

## 2016-06-19 DIAGNOSIS — E039 Hypothyroidism, unspecified: Secondary | ICD-10-CM | POA: Insufficient documentation

## 2016-06-19 DIAGNOSIS — M79642 Pain in left hand: Secondary | ICD-10-CM | POA: Diagnosis present

## 2016-06-19 DIAGNOSIS — Z7982 Long term (current) use of aspirin: Secondary | ICD-10-CM | POA: Insufficient documentation

## 2016-06-19 DIAGNOSIS — J45909 Unspecified asthma, uncomplicated: Secondary | ICD-10-CM | POA: Insufficient documentation

## 2016-06-19 DIAGNOSIS — I10 Essential (primary) hypertension: Secondary | ICD-10-CM | POA: Insufficient documentation

## 2016-06-19 DIAGNOSIS — E119 Type 2 diabetes mellitus without complications: Secondary | ICD-10-CM | POA: Insufficient documentation

## 2016-06-19 DIAGNOSIS — Z79899 Other long term (current) drug therapy: Secondary | ICD-10-CM | POA: Insufficient documentation

## 2016-06-19 DIAGNOSIS — M19042 Primary osteoarthritis, left hand: Secondary | ICD-10-CM | POA: Insufficient documentation

## 2016-06-19 DIAGNOSIS — Z8673 Personal history of transient ischemic attack (TIA), and cerebral infarction without residual deficits: Secondary | ICD-10-CM | POA: Diagnosis not present

## 2016-06-19 MED ORDER — DEXAMETHASONE 4 MG PO TABS
4.0000 mg | ORAL_TABLET | Freq: Two times a day (BID) | ORAL | Status: DC
Start: 2016-06-19 — End: 2016-08-07

## 2016-06-19 MED ORDER — ONDANSETRON HCL 4 MG PO TABS
4.0000 mg | ORAL_TABLET | Freq: Once | ORAL | Status: AC
  Administered 2016-06-19: 4 mg via ORAL
  Filled 2016-06-19: qty 1

## 2016-06-19 MED ORDER — HYDROCODONE-ACETAMINOPHEN 5-325 MG PO TABS: 1.0000 | ORAL_TABLET | ORAL | Status: DC | PRN

## 2016-06-19 MED ORDER — IBUPROFEN 400 MG PO TABS
400.0000 mg | ORAL_TABLET | Freq: Once | ORAL | Status: AC
  Administered 2016-06-19: 400 mg via ORAL
  Filled 2016-06-19: qty 1

## 2016-06-19 MED ORDER — PREDNISONE 20 MG PO TABS
40.0000 mg | ORAL_TABLET | Freq: Once | ORAL | Status: AC
  Administered 2016-06-19: 40 mg via ORAL
  Filled 2016-06-19: qty 2

## 2016-06-19 MED ORDER — HYDROCODONE-ACETAMINOPHEN 5-325 MG PO TABS
1.0000 | ORAL_TABLET | Freq: Once | ORAL | Status: AC
  Administered 2016-06-19: 1 via ORAL
  Filled 2016-06-19: qty 1

## 2016-06-19 NOTE — ED Notes (Signed)
Patient c/o right hand pain that started left hand pain. Per patient hand started hurting Saturday night. Denies any injury. Significant swelling noted. Per patient told by PCP it was arthritis. Per patient also has hx of gout. Patient reports she has to take prednisone but doesn't have any at this time.

## 2016-06-19 NOTE — ED Provider Notes (Signed)
CSN: [id number]     Arrival date & time 06/19/16  1659 History  By signing my name below, I, Eustaquio Maize, attest that this documentation has been prepared under the direction and in the presence of Lily Kocher, PA-C.  Electronically Signed: Eustaquio Maize, ED Scribe. 06/19/2016. 7:36 PM.   Chief Complaint  Patient presents with  . Hand Pain   Patient is a 80 y.o. female presenting with hand pain. The history is provided by the patient. No language interpreter was used.  Hand Pain This is a new problem. The current episode started yesterday. The problem occurs rarely. The problem has not changed since onset.Nothing relieves the symptoms. She has tried nothing for the symptoms. The treatment provided no relief.   HPI Comments: [name] is a 80 y.o. female who presents to the Emergency Department complaining of gradual onset, constant, left wrist pain and swelling that began yesterday. No known injury or strenuous activity to the hand. Pt mentions that she has hx of arthritis but states it does not flare up often. Pt is not on any anticoagulants but does take an 81 mg aspirin every day. Denies fever, weakness, numbness, or any other associated symptoms.   Past Medical History  Diagnosis Date  . Nonischemic cardiomyopathy (HCC)     LVEF 20-25%  . Asthma   . History of stroke   . Essential hypertension, benign   . Coronary atherosclerosis of native coronary artery     Nonobstructive at cath 2005  . Hypothyroidism   . Carotid artery disease ([place])   . Gout   . Renal artery stenosis (HCC)     Bilateral  . Bronchitis   . DM2 (diabetes mellitus, type 2) ([place])   . Influenza 2013  . Stroke [facility])    Past Surgical History  Procedure Laterality Date  . Abdominal hysterectomy     Family History  Problem Relation Age of Onset  . Asthma Son    Social History  Substance Use Topics  . Smoking status: Never Smoker   . Smokeless tobacco: Never Used  . Alcohol Use: No   OB History     Gravida Para Term Preterm AB TAB SAB Ectopic Multiple Living   10 10 10             Review of Systems  Constitutional: Negative for fever.  Musculoskeletal: Positive for joint swelling and arthralgias.  Neurological: Negative for weakness and numbness.  All other systems reviewed and are negative.  Allergies  Review of patient's allergies indicates no known allergies.  Home Medications   Prior to Admission medications   Medication Sig Start Date End Date Taking? Authorizing Provider  albuterol (PROVENTIL HFA;VENTOLIN HFA) 108 (90 BASE) MCG/ACT inhaler Inhale 2 puffs into the lungs every 4 (four) hours as needed for wheezing or shortness of breath.    Historical Provider, MD  aspirin EC 81 MG EC tablet Take 1 tablet (81 mg total) by mouth daily. 07/06/15   Marjean Donna, MD  carvedilol (COREG) 6.25 MG tablet Take 1 tablet (6.25 mg total) by mouth 2 (two) times daily with a meal. 07/06/15   Angus McInnis, MD  furosemide (LASIX) 40 MG tablet Take Lasix 40 mg daily. MAY take additional 40 that day if she gains 2-3 lbs in a 24 hr period 11/13/15   Satira Sark, MD  isosorbide mononitrate (IMDUR) 30 MG 24 hr tablet Take 0.5 tablets (15 mg total) by mouth daily. 07/06/15   Marjean Donna, MD  lisinopril (  PRINIVIL,ZESTRIL) 5 MG tablet Take 1 tablet (5 mg total) by mouth daily. 07/06/15   Marjean Donna, MD  meclizine (ANTIVERT) 50 MG tablet Take 0.5 tablets (25 mg total) by mouth 3 (three) times daily as needed for dizziness. 08/16/15   Forde Dandy, MD  metolazone (ZAROXOLYN) 2.5 MG tablet If you gain more than 3 lbs over night ,take one zaroxolyn   (2.5 mg) that day 11/02/15   Satira Sark, MD  potassium chloride SA (KLOR-CON M20) 20 MEQ tablet Take 1 tablet (20 mEq total) by mouth daily. 10/26/15   Satira Sark, MD  predniSONE (DELTASONE) 50 MG tablet One tablet po daily for 4 days 02/02/16   Ripley Fraise, MD   BP 133/69 mmHg  Pulse 96  Temp(Src) 98.4 F (36.9 C) (Oral)  Resp 16   Ht 5\' 6"  (1.676 m)  Wt 167 lb (75.751 kg)  BMI 26.97 kg/m2  SpO2 98%   Physical Exam  Constitutional: She is oriented to person, place, and time. She appears well-developed and well-nourished. No distress.  HENT:  Head: Normocephalic and atraumatic.  Eyes: Conjunctivae and EOM are normal.  Neck: Neck supple. No tracheal deviation present.  Cardiovascular: Normal rate.   Pulmonary/Chest: Effort normal. No respiratory distress.  Musculoskeletal: Normal range of motion. She exhibits edema.  No hot joints involvement of the left shoulder and left elbow.  There is some increased warmth of the left wrist.  The radial pulse is 2+ on left.  Cap refill < 2 seconds.  There is swelling over the dorsum of the left wrist.  There is no deformity of the wrist.   Neurological: She is alert and oriented to person, place, and time.  Skin: Skin is warm and dry.  Psychiatric: She has a normal mood and affect. Her behavior is normal.  Nursing note and vitals reviewed.   ED Course  Pt seen with me by Dr Regenia Skeeter  Procedures (including critical care time)  DIAGNOSTIC STUDIES: Oxygen Saturation is 98% on RA, normal by my interpretation.    COORDINATION OF CARE: 7:35 PM-Discussed treatment plan which includes Rx Prednisone with pt at bedside and pt agreed to plan.   Labs Review Labs Reviewed - No data to display  Imaging Review Dg Hand Complete Left  06/19/2016  CLINICAL DATA:  Left hand pain EXAM: LEFT HAND - COMPLETE 3+ VIEW COMPARISON:  None. FINDINGS: No fracture or dislocation is seen. Mild to moderate degenerative changes involving the 1st MCP joint and 1st carpometacarpal joint. No marginal erosions. Visualized soft tissues are within normal limits. IMPRESSION: No fracture or dislocation is seen. Mild to moderate degenerative changes involving the 1st digit. Electronically Signed   By: Julian Hy M.D.   On: 06/19/2016 17:42   I have personally reviewed and evaluated these images and  lab results as part of my medical decision-making.   EKG Interpretation None      MDM Xray of the left hand is negative for fx or signs of infection. There is DJD changes present. Pt has pain of the left wrist. Xray suggested, but pt refused. No neurovascular changes noted. Rx for    Pt refused wrist xray. Decadron and norco given to the patient. Pt to follow up with  Dr Maudie Mercury for recheck and for plan of breakthrough pain management.   Final diagnoses:  Primary osteoarthritis of left hand    **I personally performed the services described in this documentation, which was scribed in my presence. The recorded information  has been reviewed and is accurate.* I have reviewed nursing notes, vital signs, and all appropriate lab and imaging results for this patient.     Lily Kocher, PA-C 06/21/16 [place], MD 06/23/16 [phone]

## 2016-06-19 NOTE — Discharge Instructions (Signed)
Please see Dr. Maudie Mercury as soon as possible for reevaluation of your arthritis involving your hand and wrist . Please use Decadron 2 times daily with a meal. May use Norco for pain not improved by Tylenol on. Norco may cause drowsiness, please use this medication with caution. Osteoarthritis Osteoarthritis is a disease that causes soreness and inflammation of a joint. It occurs when the cartilage at the affected joint wears down. Cartilage acts as a cushion, covering the ends of bones where they meet to form a joint. Osteoarthritis is the most common form of arthritis. It often occurs in older people. The joints affected most often by this condition include those in the:  Ends of the fingers.  Thumbs.  Neck.  Lower back.  Knees.  Hips. CAUSES  Over time, the cartilage that covers the ends of bones begins to wear away. This causes bone to rub on bone, producing pain and stiffness in the affected joints.  RISK FACTORS Certain factors can increase your chances of having osteoarthritis, including:  Older age.  Excessive body weight.  Overuse of joints.  Previous joint injury. SIGNS AND SYMPTOMS   Pain, swelling, and stiffness in the joint.  Over time, the joint may lose its normal shape.  Small deposits of bone (osteophytes) may grow on the edges of the joint.  Bits of bone or cartilage can break off and float inside the joint space. This may cause more pain and damage. DIAGNOSIS  Your health care provider will do a physical exam and ask about your symptoms. Various tests may be ordered, such as:  X-rays of the affected joint.  Blood tests to rule out other types of arthritis. Additional tests may be used to diagnose your condition. TREATMENT  Goals of treatment are to control pain and improve joint function. Treatment plans may include:  A prescribed exercise program that allows for rest and joint relief.  A weight control plan.  Pain relief techniques, such as:  Properly  applied heat and cold.  Electric pulses delivered to nerve endings under the skin (transcutaneous electrical nerve stimulation [TENS]).  Massage.  Certain nutritional supplements.  Medicines to control pain, such as:  Acetaminophen.  Nonsteroidal anti-inflammatory drugs (NSAIDs), such as naproxen.  Narcotic or central-acting agents, such as tramadol.  Corticosteroids. These can be given orally or as an injection.  Surgery to reposition the bones and relieve pain (osteotomy) or to remove loose pieces of bone and cartilage. Joint replacement may be needed in advanced states of osteoarthritis. HOME CARE INSTRUCTIONS   Take medicines only as directed by your health care provider.  Maintain a healthy weight. Follow your health care provider's instructions for weight control. This may include dietary instructions.  Exercise as directed. Your health care provider can recommend specific types of exercise. These may include:  Strengthening exercises. These are done to strengthen the muscles that support joints affected by arthritis. They can be performed with weights or with exercise bands to add resistance.  Aerobic activities. These are exercises, such as brisk walking or low-impact aerobics, that get your heart pumping.  Range-of-motion activities. These keep your joints limber.  Balance and agility exercises. These help you maintain daily living skills.  Rest your affected joints as directed by your health care provider.  Keep all follow-up visits as directed by your health care provider. SEEK MEDICAL CARE IF:   Your skin turns red.  You develop a rash in addition to your joint pain.  You have worsening joint pain.  You  have a fever along with joint or muscle aches. SEEK IMMEDIATE MEDICAL CARE IF:  You have a significant loss of weight or appetite.  You have night sweats. [place] of Arthritis and Musculoskeletal and Skin Diseases:  www.niams.SouthExposed.es  [employer] on Aging: http://kim-miller.com/  American College of Rheumatology: www.rheumatology.org   This information is not intended to replace advice given to you by your health care provider. Make sure you discuss any questions you have with your health care provider.   Document Released: 11/28/2005 Document Revised: 12/19/2014 Document Reviewed: 08/05/2013 Elsevier Interactive Patient Education [employer].

## 2016-08-07 ENCOUNTER — Emergency Department (HOSPITAL_COMMUNITY)
Admission: EM | Admit: 2016-08-07 | Discharge: 2016-08-07 | Disposition: A | Discharge status: Home / Self Care | Payer: Medicare Other | Attending: Emergency Medicine | Admitting: Emergency Medicine

## 2016-08-07 ENCOUNTER — Encounter (HOSPITAL_COMMUNITY): Payer: Self-pay | Admitting: Emergency Medicine

## 2016-08-07 DIAGNOSIS — J45909 Unspecified asthma, uncomplicated: Secondary | ICD-10-CM | POA: Insufficient documentation

## 2016-08-07 DIAGNOSIS — I2510 Atherosclerotic heart disease of native coronary artery without angina pectoris: Secondary | ICD-10-CM | POA: Insufficient documentation

## 2016-08-07 DIAGNOSIS — E119 Type 2 diabetes mellitus without complications: Secondary | ICD-10-CM | POA: Insufficient documentation

## 2016-08-07 DIAGNOSIS — Z79899 Other long term (current) drug therapy: Secondary | ICD-10-CM | POA: Diagnosis not present

## 2016-08-07 DIAGNOSIS — Z7982 Long term (current) use of aspirin: Secondary | ICD-10-CM | POA: Diagnosis not present

## 2016-08-07 DIAGNOSIS — M79642 Pain in left hand: Secondary | ICD-10-CM | POA: Diagnosis present

## 2016-08-07 DIAGNOSIS — M1000 Idiopathic gout, unspecified site: Secondary | ICD-10-CM | POA: Insufficient documentation

## 2016-08-07 DIAGNOSIS — E039 Hypothyroidism, unspecified: Secondary | ICD-10-CM | POA: Diagnosis not present

## 2016-08-07 LAB — CBG MONITORING, ED: Glucose-Capillary: 187 mg/dL — ABNORMAL HIGH (ref 65–99)

## 2016-08-07 MED ORDER — HYDROCODONE-ACETAMINOPHEN 5-325 MG PO TABS: 2.0000 | ORAL_TABLET | ORAL | 0 refills | Status: DC | PRN

## 2016-08-07 MED ORDER — DEXAMETHASONE 4 MG PO TABS: 4.0000 mg | ORAL_TABLET | Freq: Two times a day (BID) | ORAL | 0 refills | Status: DC

## 2016-08-07 NOTE — ED Notes (Signed)
Pt reports left hand swelling, pain, and redness started on Monday; similar to previous gout flare-ups. States "They gave me medicine last time, but I don't have any more".

## 2016-08-07 NOTE — ED Provider Notes (Signed)
[facility] DEPT Provider Note   CSN: [id number] Arrival date & time: 08/07/16  1447  By signing my name below, I, Dora Sims, attest that this documentation has been prepared under the direction and in the presence of Alyse Low, [place]. Electronically Signed: Dora Sims, Scribe. 08/07/2016. 3:16 PM.  History   Chief Complaint Chief Complaint  Patient presents with  . Hand Problem    The history is provided by the patient. No language interpreter was used.     HPI Comments: [name] is a 80 y.o. female with PMHx of gout who presents to the Emergency Department complaining of sudden onset, constant, left hand pain due to chronic gout for the last several days. She notes associated swelling and redness. Pt states her current symptoms are exactly like her previous flare ups with gout. Pt states she has pain medications at home but recently ran out; she took them on an as needed basis. She denies h/o kidney problems, liver problems, or GI bleed. Pt's blood pressure was elevated today in the ER and she states she did not take her medication for HTN this morning. She denies any other complaints or symptoms.   PCP: Dr. Maudie Mercury  Past Medical History:  Diagnosis Date  . Asthma   . Bronchitis   . Carotid artery disease ([place])   . Coronary atherosclerosis of native coronary artery    Nonobstructive at cath 2005  . DM2 (diabetes mellitus, type 2) ([place])   . Essential hypertension, benign   . Gout   . History of stroke   . Hypothyroidism   . Influenza 2013  . Nonischemic cardiomyopathy (HCC)    LVEF 20-25%  . Renal artery stenosis (HCC)    Bilateral  . Stroke [facility])     Patient Active Problem List   Diagnosis Date Noted  . Acute exacerbation of CHF (congestive heart failure) ([place]) 09/01/2015  . CHF (congestive heart failure) ([place]) 07/02/2015  . Elevated troponin I level 07/02/2015  . Right bundle branch block 07/02/2015  . Renal insufficiency 07/02/2015  . CAP  (community acquired pneumonia) 04/25/2015  . Acute respiratory failure with hypoxia ([place]) 04/25/2015  . Acute renal injury ([place]) 04/25/2015  . DM2 (diabetes mellitus, type 2) ([place]) 04/25/2015  . ARF (acute renal failure) ([place]) 06/18/2014  . Mitral regurgitation 06/09/2014  . Dyspnea 06/06/2014  . Asthma exacerbation 06/06/2014  . Acute on chronic combined systolic and diastolic CHF (congestive heart failure) ([place]) 06/06/2014  . Influenza 12/10/2012  . HYPERLIPIDEMIA 07/08/2010  . Essential hypertension 04/06/2010  . CORONARY ATHEROSCLEROSIS NATIVE CORONARY ARTERY 04/06/2010  . CARDIOMYOPATHY 03/29/2010    Past Surgical History:  Procedure Laterality Date  . ABDOMINAL HYSTERECTOMY      OB History    Gravida Para Term Preterm AB Living   15 15 15          SAB TAB Ectopic Multiple Live Births                   Home Medications    Prior to Admission medications   Medication Sig Start Date End Date Taking? Authorizing Provider  albuterol (PROVENTIL HFA;VENTOLIN HFA) 108 (90 BASE) MCG/ACT inhaler Inhale 2 puffs into the lungs every 4 (four) hours as needed for wheezing or shortness of breath.    Historical Provider, MD  aspirin EC 81 MG EC tablet Take 1 tablet (81 mg total) by mouth daily. 07/06/15   Marjean Donna, MD  carvedilol (COREG) 6.25 MG tablet Take 1 tablet (6.25 mg  total) by mouth 2 (two) times daily with a meal. 07/06/15   Marjean Donna, MD  dexamethasone (DECADRON) 4 MG tablet Take 1 tablet (4 mg total) by mouth 2 (two) times daily with a meal. 06/19/16   Lily Kocher, PA-C  furosemide (LASIX) 40 MG tablet Take Lasix 40 mg daily. MAY take additional 40 that day if she gains 2-3 lbs in a 24 hr period Patient taking differently: Take 40 mg by mouth daily. Take Lasix 40 mg daily. MAY take additional 40 that day if she gains 2-3 lbs in a 24 hr period 11/13/15   Satira Sark, MD  HYDROcodone-acetaminophen (NORCO/VICODIN) 5-325 MG tablet Take 1 tablet by mouth every 4 (four)  hours as needed. 06/19/16   Lily Kocher, PA-C  isosorbide mononitrate (IMDUR) 30 MG 24 hr tablet Take 0.5 tablets (15 mg total) by mouth daily. 07/06/15   Angus McInnis, MD  lisinopril (PRINIVIL,ZESTRIL) 5 MG tablet Take 1 tablet (5 mg total) by mouth daily. 07/06/15   Marjean Donna, MD  meclizine (ANTIVERT) 50 MG tablet Take 0.5 tablets (25 mg total) by mouth 3 (three) times daily as needed for dizziness. 08/16/15   Forde Dandy, MD  metolazone (ZAROXOLYN) 2.5 MG tablet If you gain more than 3 lbs over night ,take one zaroxolyn   (2.5 mg) that day Patient taking differently: Take 2.5 mg by mouth daily as needed. If you gain more than 3 lbs over night ,take one zaroxolyn   (2.5 mg) that day 11/02/15   Satira Sark, MD  oxyCODONE-acetaminophen (PERCOCET/ROXICET) 5-325 MG tablet Take 1 tablet by mouth every 4 (four) hours as needed. 06/07/16   Historical Provider, MD  potassium chloride SA (KLOR-CON M20) 20 MEQ tablet Take 1 tablet (20 mEq total) by mouth daily. 10/26/15   Satira Sark, MD    Family History Family History  Problem Relation Age of Onset  . Asthma Son     Social History Social History  Substance Use Topics  . Smoking status: Never Smoker  . Smokeless tobacco: Never Used  . Alcohol use No     Allergies   Review of patient's allergies indicates no known allergies.   Review of Systems Review of Systems  All other systems reviewed and are negative.   Physical Exam Updated Vital Signs BP 190/84 (BP Location: Right Arm)   Pulse 97   Temp 98.6 F (37 C) (Oral)   Resp 16   Ht 5\' 5"  (1.651 m)   Wt 160 lb (72.6 kg)   SpO2 97%   BMI 26.63 kg/m   Physical Exam  Constitutional: She is oriented to person, place, and time. She appears well-developed and well-nourished. No distress.  HENT:  Head: Normocephalic and atraumatic.  Eyes: Conjunctivae and EOM are normal.  Neck: Neck supple. No tracheal deviation present.  Cardiovascular: Normal rate.     Pulmonary/Chest: Effort normal. No respiratory distress.  Musculoskeletal: Normal range of motion.  Neurological: She is alert and oriented to person, place, and time.  Skin: Skin is warm and dry.  Psychiatric: She has a normal mood and affect. Her behavior is normal.  Nursing note and vitals reviewed.   ED Treatments / Results  Labs (all labs ordered are listed, but only abnormal results are displayed) Labs Reviewed  CBG MONITORING, ED - Abnormal; Notable for the following:       Result Value   Glucose-Capillary 187 (*)    All other components within normal limits    EKG  EKG Interpretation None       Radiology No results found.  Procedures Procedures (including critical care time)  DIAGNOSTIC STUDIES: Oxygen Saturation is 97% on RA, normal by my interpretation.    COORDINATION OF CARE: 3:16 PM Discussed treatment plan with pt at bedside and pt agreed to plan.  Medications Ordered in ED Medications - No data to display   Initial Impression / Assessment and Plan / ED Course  I have reviewed the triage vital signs and the nursing notes.  Pertinent labs & imaging results that were available during my care of the patient were reviewed by me and considered in my medical decision making (see chart for details).  Clinical Course    Pt has taken decadron in the past with go relief.  Pt request again.  Final Clinical Impressions(s) / ED Diagnoses   Final diagnoses:  Acute idiopathic gout, unspecified site    New Prescriptions Discharge Medication List as of 08/07/2016  3:25 PM    START taking these medications   Details  HYDROcodone-acetaminophen (NORCO/VICODIN) 5-325 MG tablet Take 2 tablets by mouth every 4 (four) hours as needed., Starting Sun 08/07/2016, [place], PA-C 08/07/16 Sutcliffe Liu, MD 08/07/16 1623

## 2016-08-07 NOTE — ED Triage Notes (Signed)
Pt reports chronic gout in left hand.  Swelling and pain since Monday.

## 2016-11-16 ENCOUNTER — Emergency Department (HOSPITAL_COMMUNITY): Payer: Medicare Other

## 2016-11-16 ENCOUNTER — Emergency Department (HOSPITAL_COMMUNITY)
Admission: EM | Admit: 2016-11-16 | Discharge: 2016-11-16 | Disposition: A | Discharge status: Home / Self Care | Payer: Medicare Other | Attending: Emergency Medicine | Admitting: Emergency Medicine

## 2016-11-16 ENCOUNTER — Encounter (HOSPITAL_COMMUNITY): Payer: Self-pay | Admitting: Emergency Medicine

## 2016-11-16 DIAGNOSIS — E119 Type 2 diabetes mellitus without complications: Secondary | ICD-10-CM | POA: Insufficient documentation

## 2016-11-16 DIAGNOSIS — I2510 Atherosclerotic heart disease of native coronary artery without angina pectoris: Secondary | ICD-10-CM | POA: Diagnosis not present

## 2016-11-16 DIAGNOSIS — Z7984 Long term (current) use of oral hypoglycemic drugs: Secondary | ICD-10-CM | POA: Insufficient documentation

## 2016-11-16 DIAGNOSIS — Z7982 Long term (current) use of aspirin: Secondary | ICD-10-CM | POA: Insufficient documentation

## 2016-11-16 DIAGNOSIS — M10072 Idiopathic gout, left ankle and foot: Secondary | ICD-10-CM | POA: Diagnosis not present

## 2016-11-16 DIAGNOSIS — E039 Hypothyroidism, unspecified: Secondary | ICD-10-CM | POA: Insufficient documentation

## 2016-11-16 DIAGNOSIS — J45901 Unspecified asthma with (acute) exacerbation: Secondary | ICD-10-CM | POA: Diagnosis not present

## 2016-11-16 DIAGNOSIS — I5043 Acute on chronic combined systolic (congestive) and diastolic (congestive) heart failure: Secondary | ICD-10-CM | POA: Diagnosis not present

## 2016-11-16 DIAGNOSIS — Z79899 Other long term (current) drug therapy: Secondary | ICD-10-CM | POA: Insufficient documentation

## 2016-11-16 DIAGNOSIS — M10062 Idiopathic gout, left knee: Secondary | ICD-10-CM | POA: Insufficient documentation

## 2016-11-16 DIAGNOSIS — I110 Hypertensive heart disease with heart failure: Secondary | ICD-10-CM | POA: Diagnosis not present

## 2016-11-16 DIAGNOSIS — M25562 Pain in left knee: Secondary | ICD-10-CM | POA: Diagnosis present

## 2016-11-16 DIAGNOSIS — M109 Gout, unspecified: Secondary | ICD-10-CM

## 2016-11-16 LAB — CBC WITH DIFFERENTIAL/PLATELET
Basophils Absolute: 0 10*3/uL (ref 0.0–0.1)
Basophils Relative: 0 %
Eosinophils Absolute: 0 10*3/uL (ref 0.0–0.7)
Eosinophils Relative: 0 %
HCT: 38.8 % (ref 36.0–46.0)
Hemoglobin: 12.5 g/dL (ref 12.0–15.0)
Lymphocytes Relative: 17 %
Lymphs Abs: 1.6 10*3/uL (ref 0.7–4.0)
MCH: 27.5 pg (ref 26.0–34.0)
MCHC: 32.2 g/dL (ref 30.0–36.0)
MCV: 85.3 fL (ref 78.0–100.0)
Monocytes Absolute: 1.3 10*3/uL — ABNORMAL HIGH (ref 0.1–1.0)
Monocytes Relative: 14 %
Neutro Abs: 6.6 10*3/uL (ref 1.7–7.7)
Neutrophils Relative %: 69 %
Platelets: 199 10*3/uL (ref 150–400)
RBC: 4.55 MIL/uL (ref 3.87–5.11)
RDW: 13.5 % (ref 11.5–15.5)
WBC: 9.6 10*3/uL (ref 4.0–10.5)

## 2016-11-16 LAB — BASIC METABOLIC PANEL
Anion gap: 9 (ref 5–15)
BUN: 20 mg/dL (ref 6–20)
CO2: 21 mmol/L — ABNORMAL LOW (ref 22–32)
Calcium: 9.3 mg/dL (ref 8.9–10.3)
Chloride: 106 mmol/L (ref 101–111)
Creatinine, Ser: 1.47 mg/dL — ABNORMAL HIGH (ref 0.44–1.00)
GFR calc Af Amer: 39 mL/min — ABNORMAL LOW (ref >60)
GFR calc non Af Amer: 33 mL/min — ABNORMAL LOW (ref >60)
Glucose, Bld: 232 mg/dL — ABNORMAL HIGH (ref 65–99)
Potassium: 3.3 mmol/L — ABNORMAL LOW (ref 3.5–5.1)
Sodium: 136 mmol/L (ref 135–145)

## 2016-11-16 MED ORDER — HYDROCODONE-ACETAMINOPHEN 5-325 MG PO TABS: 1.0000 | ORAL_TABLET | ORAL | 0 refills | Status: DC | PRN

## 2016-11-16 MED ORDER — HYDROCODONE-ACETAMINOPHEN 5-325 MG PO TABS
1.0000 | ORAL_TABLET | Freq: Once | ORAL | Status: AC
  Administered 2016-11-16: 1 via ORAL
  Filled 2016-11-16: qty 1

## 2016-11-16 MED ORDER — PREDNISONE 10 MG PO TABS: 40.0000 mg | ORAL_TABLET | Freq: Every day | ORAL | 0 refills | Status: DC

## 2016-11-16 NOTE — ED Notes (Signed)
Patient has swelling and pain noted to left knee and foot. States it started today. States she has history of gout. Patient unable to ambulate at this time.

## 2016-11-16 NOTE — ED Triage Notes (Signed)
Having Left foot pain for over one week.  History of gout.  Rates pain 10/10.  Pt is not able to walk on foot.

## 2016-11-16 NOTE — Discharge Instructions (Signed)
Please watch your blood sugar while you are on steroids. Take medications as prescribed. Return for worsening symptoms, including fever, worsening pain or swelling or redness or any other symptoms concerning to you. Please follow-up with your primary care doctor within the next few days for recheck.

## 2016-11-16 NOTE — ED Provider Notes (Signed)
[facility] DEPT Provider Note   CSN: [id number] Arrival date & time: 11/16/16  1826     History   Chief Complaint Chief Complaint  Patient presents with  . Foot Pain    left    HPI [name] is a 80 y.o. female.  HPI  80 year old female who presents with left foot pain and left knee pain. She has a history of nonischemic cardiomyopathy, hypertension, diabetes, osteoarthritis and gout. Has noted pain and swelling to the left foot and left knee starting 1 week ago but worsened today, which seems consistent with prior history of gout flareups. Has not had trauma or fall, fever or chills, or noted increased redness over the areas. Has been having difficulty ambulating today. Has not taking any medications for her pain.  Past Medical History:  Diagnosis Date  . Asthma   . Bronchitis   . Carotid artery disease ([place])   . Coronary atherosclerosis of native coronary artery    Nonobstructive at cath 2005  . DM2 (diabetes mellitus, type 2) ([place])   . Essential hypertension, benign   . Gout   . History of stroke   . Hypothyroidism   . Influenza 2013  . Nonischemic cardiomyopathy (HCC)    LVEF 20-25%  . Renal artery stenosis (HCC)    Bilateral  . Stroke [facility])     Patient Active Problem List   Diagnosis Date Noted  . Acute exacerbation of CHF (congestive heart failure) ([place]) 09/01/2015  . CHF (congestive heart failure) ([place]) 07/02/2015  . Elevated troponin I level 07/02/2015  . Right bundle branch block 07/02/2015  . Renal insufficiency 07/02/2015  . CAP (community acquired pneumonia) 04/25/2015  . Acute respiratory failure with hypoxia ([place]) 04/25/2015  . Acute renal injury ([place]) 04/25/2015  . DM2 (diabetes mellitus, type 2) ([place]) 04/25/2015  . ARF (acute renal failure) ([place]) 06/18/2014  . Mitral regurgitation 06/09/2014  . Dyspnea 06/06/2014  . Asthma exacerbation 06/06/2014  . Acute on chronic combined systolic and diastolic CHF (congestive heart failure) ([place])  06/06/2014  . Influenza 12/10/2012  . HYPERLIPIDEMIA 07/08/2010  . Essential hypertension 04/06/2010  . CORONARY ATHEROSCLEROSIS NATIVE CORONARY ARTERY 04/06/2010  . CARDIOMYOPATHY 03/29/2010    Past Surgical History:  Procedure Laterality Date  . ABDOMINAL HYSTERECTOMY      OB History    Gravida Para Term Preterm AB Living   15 15 15          SAB TAB Ectopic Multiple Live Births                   Home Medications    Prior to Admission medications   Medication Sig Start Date End Date Taking? Authorizing Provider  aspirin EC 81 MG EC tablet Take 1 tablet (81 mg total) by mouth daily. 07/06/15   Marjean Donna, MD  carvedilol (COREG) 6.25 MG tablet Take 1 tablet (6.25 mg total) by mouth 2 (two) times daily with a meal. Patient taking differently: Take 6.25 mg by mouth daily.  07/06/15   Marjean Donna, MD  dexamethasone (DECADRON) 4 MG tablet Take 1 tablet (4 mg total) by mouth 2 (two) times daily with a meal. 08/07/16   Fransico Meadow, PA-C  furosemide (LASIX) 40 MG tablet Take Lasix 40 mg daily. MAY take additional 40 that day if she gains 2-3 lbs in a 24 hr period Patient taking differently: Take 40 mg by mouth daily. Take Lasix 40 mg daily. MAY take additional 40 that day if she gains 2-3  lbs in a 24 hr period 11/13/15   Satira Sark, MD  HYDROcodone-acetaminophen (NORCO/VICODIN) 5-325 MG tablet Take 2 tablets by mouth every 4 (four) hours as needed. 08/07/16   Fransico Meadow, PA-C  HYDROcodone-acetaminophen (NORCO/VICODIN) 5-325 MG tablet Take 1 tablet by mouth every 4 (four) hours as needed for moderate pain or severe pain. 11/16/16   Forde Dandy, MD  isosorbide mononitrate (IMDUR) 30 MG 24 hr tablet Take 0.5 tablets (15 mg total) by mouth daily. 07/06/15   Angus McInnis, MD  lisinopril (PRINIVIL,ZESTRIL) 5 MG tablet Take 1 tablet (5 mg total) by mouth daily. 07/06/15   Marjean Donna, MD  meclizine (ANTIVERT) 50 MG tablet Take 0.5 tablets (25 mg total) by mouth 3 (three) times  daily as needed for dizziness. 08/16/15   Forde Dandy, MD  metFORMIN (GLUCOPHAGE) 500 MG tablet Take 500 mg by mouth 2 (two) times daily. 10/24/16   Historical Provider, MD  metolazone (ZAROXOLYN) 2.5 MG tablet If you gain more than 3 lbs over night ,take one zaroxolyn   (2.5 mg) that day Patient taking differently: Take 2.5 mg by mouth daily as needed. If you gain more than 3 lbs over night ,take one zaroxolyn   (2.5 mg) that day 11/02/15   Satira Sark, MD  potassium chloride SA (KLOR-CON M20) 20 MEQ tablet Take 1 tablet (20 mEq total) by mouth daily. 10/26/15   Satira Sark, MD  predniSONE (DELTASONE) 10 MG tablet Take 4 tablets (40 mg total) by mouth daily. 11/16/16   Forde Dandy, MD    Family History Family History  Problem Relation Age of Onset  . Asthma Son     Social History Social History  Substance Use Topics  . Smoking status: Never Smoker  . Smokeless tobacco: Never Used  . Alcohol use No     Allergies   Patient has no known allergies.   Review of Systems Review of Systems 10/14 systems reviewed and are negative other than those stated in the HPI   Physical Exam Updated Vital Signs BP 141/84 (BP Location: Left Arm)   Pulse (!) 56   Temp 100 F (37.8 C) (Oral)   Resp 16   Wt 174 lb (78.9 kg)   SpO2 97%   BMI 28.96 kg/m   Physical Exam Physical Exam  Nursing note and vitals reviewed. Constitutional: Well developed, well nourished, non-toxic, and in no acute distress Head: Normocephalic and atraumatic.  Mouth/Throat: Oropharynx is clear and moist.  Neck: Normal range of motion. Neck supple.  Cardiovascular: Normal rate and regular rhythm.   Pulmonary/Chest: Effort normal and breath sounds normal.  Abdominal: Soft. There is no tenderness. There is no rebound and no guarding.  Musculoskeletal: Limited range of motion of the left knee and left foot due to pain. Soft tissue swelling noted to dorsum of foot and tender to palpation over this area.  Small effusion of the left knee noted without overlying erythema or warmth. Neurological: Alert, no facial droop, fluent speech, moves all extremities symmetrically Skin: Skin is warm and dry.  Psychiatric: Cooperative   ED Treatments / Results  Labs (all labs ordered are listed, but only abnormal results are displayed) Labs Reviewed  CBC WITH DIFFERENTIAL/PLATELET - Abnormal; Notable for the following:       Result Value   Monocytes Absolute 1.3 (*)    All other components within normal limits  BASIC METABOLIC PANEL - Abnormal; Notable for the following:    Potassium 3.3 (*)  CO2 21 (*)    Glucose, Bld 232 (*)    Creatinine, Ser 1.47 (*)    GFR calc non Af Amer 33 (*)    GFR calc Af Amer 39 (*)    All other components within normal limits    EKG  EKG Interpretation None       Radiology Dg Knee Complete 4 Views Left  Result Date: 11/16/2016 CLINICAL DATA:  Preop.  Pain and swelling in the left knee. EXAM: LEFT KNEE - COMPLETE 4+ VIEW COMPARISON:  None. FINDINGS: A moderate-sized joint effusion is present. Mild prepatellar soft tissue swelling is present. The knee is located. No acute or focal osseous abnormality is present. Atherosclerotic calcifications are present. IMPRESSION: 1. Moderate-sized joint effusion and mild prepatellar soft tissue swelling may be related to gout. 2. No acute or focal osseous abnormalities. Electronically Signed   By: San Morelle M.D.   On: 11/16/2016 19:44   Dg Foot Complete Left  Result Date: 11/16/2016 CLINICAL DATA:  Gout.  Pain and swelling to the left foot. EXAM: LEFT FOOT - COMPLETE 3+ VIEW COMPARISON:  Right foot radiographs 06/06/2016 FINDINGS: Soft tissue swelling is noted over the dorsum of foot and at the fifth MTP joint. There are no significant erosions or mass lesion. Mild generalized osteopenia is present. Degenerative changes are noted in the hindfoot. Calcaneal spurs are present. IMPRESSION: 1. Soft swelling at the level  of the fifth MTP joint may be related to gout. 2. No acute or focal osseous abnormality. Electronically Signed   By: San Morelle M.D.   On: 11/16/2016 19:41    Procedures Procedures (including critical care time)  Medications Ordered in ED Medications  HYDROcodone-acetaminophen (NORCO/VICODIN) 5-325 MG per tablet 1 tablet (1 tablet Oral Given 11/16/16 1908)     Initial Impression / Assessment and Plan / ED Course  I have reviewed the triage vital signs and the nursing notes.  Pertinent labs & imaging results that were available during my care of the patient were reviewed by me and considered in my medical decision making (see chart for details).  Clinical Course     Presenting with pain and swelling to the left knee and left foot. Is well appearing and in no acute distress. Swelling to left knee noted w/o overlying erythema. XR visualized and with effusion. Tenderness and swelling over dorsum of the left foot with mild erythema and warmth. X-ray visualized and w/ soft tissue swelling at 5th MTP and over dorsum. Blood work reassuring with evidence of CKD. No concerns for septic arthritis at this time. Likely gout, and she states this feels the same. Pain significantly improved after norco and she has improved range of motion of foot and knee afterwards. Given course of steroids and norco for home for gout. Discussed strict return instructions and follow-up instructions. She expressed understanding of all discharge instructions and felt comfortable with the plan of care.  Final Clinical Impressions(s) / ED Diagnoses   Final diagnoses:  Gouty arthritis    New Prescriptions New Prescriptions   HYDROCODONE-ACETAMINOPHEN (NORCO/VICODIN) 5-325 MG TABLET    Take 1 tablet by mouth every 4 (four) hours as needed for moderate pain or severe pain.   PREDNISONE (DELTASONE) 10 MG TABLET    Take 4 tablets (40 mg total) by mouth daily.     Forde Dandy, MD 11/16/16 2052

## 2016-11-24 ENCOUNTER — Other Ambulatory Visit: Payer: Self-pay | Admitting: Internal Medicine

## 2016-11-24 DIAGNOSIS — R748 Abnormal levels of other serum enzymes: Secondary | ICD-10-CM

## 2017-02-28 ENCOUNTER — Other Ambulatory Visit (HOSPITAL_COMMUNITY): Payer: Self-pay | Admitting: Nephrology

## 2017-02-28 DIAGNOSIS — N183 Chronic kidney disease, stage 3 unspecified: Secondary | ICD-10-CM

## 2017-03-07 NOTE — Progress Notes (Signed)
Cardiology Office Note  Date: 03/09/2017   ID: [name] [birth year], MRN [id number]  PCP: Katie Gravel, MD  Primary Cardiologist: Rozann Lesches, MD   Chief Complaint  Patient presents with  . Cardiomyopathy    History of Present Illness: [name] is a 81 y.o. female last seen by [name] NP in November 2016. She presents today overdue for follow-up with her daughter. They live in the same home. Ms. Magno is functionally limited, uses a wheelchair, also has trouble with recurring gouty arthritis. She is not reporting any decline in her breathing status, reports NYHA class II dyspnea with basic ADLs. Also states leg edema has been adequately controlled. Weight is up in comparison to 2016. She does not weigh herself daily. We discussed obtaining a scale for home to better help adjust diuretic medications.  Last lab work from December 2017 is outlined below. She had echocardiogram in 2016.  I reviewed her current cardiac medications. She is on aspirin, Coreg, Lasix, Imdur, lisinopril, as needed metolazone, and potassium supplements.  Past Medical History:  Diagnosis Date  . Asthma   . Bronchitis   . Carotid artery disease ([place])   . Coronary atherosclerosis of native coronary artery    Nonobstructive at cath 2005  . DM2 (diabetes mellitus, type 2) ([place])   . Essential hypertension, benign   . Gout   . History of stroke   . Hypothyroidism   . Influenza 2013  . Nonischemic cardiomyopathy (HCC)    LVEF 20-25%  . Renal artery stenosis (HCC)    Bilateral  . Stroke [facility])     Past Surgical History:  Procedure Laterality Date  . ABDOMINAL HYSTERECTOMY      Current Outpatient Prescriptions  Medication Sig Dispense Refill  . aspirin EC 81 MG EC tablet Take 1 tablet (81 mg total) by mouth daily. 30 tablet 5  . carvedilol (COREG) 6.25 MG tablet Take 1 tablet (6.25 mg total) by mouth 2 (two) times daily with a meal. (Patient taking differently: Take 6.25 mg by  mouth daily. ) 60 tablet 5  . furosemide (LASIX) 40 MG tablet Take Lasix 40 mg daily. MAY take additional 40 that day if she gains 2-3 lbs in a 24 hr period (Patient taking differently: Take 40 mg by mouth daily. Take Lasix 40 mg daily. MAY take additional 40 that day if she gains 2-3 lbs in a 24 hr period) 90 tablet 3  . HYDROcodone-acetaminophen (NORCO/VICODIN) 5-325 MG tablet Take 2 tablets by mouth every 4 (four) hours as needed. 16 tablet 0  . HYDROcodone-acetaminophen (NORCO/VICODIN) 5-325 MG tablet Take 1 tablet by mouth every 4 (four) hours as needed for moderate pain or severe pain. 10 tablet 0  . isosorbide mononitrate (IMDUR) 30 MG 24 hr tablet Take 0.5 tablets (15 mg total) by mouth daily. 30 tablet 5  . lisinopril (PRINIVIL,ZESTRIL) 5 MG tablet Take 1 tablet (5 mg total) by mouth daily. 30 tablet 5  . meclizine (ANTIVERT) 50 MG tablet Take 0.5 tablets (25 mg total) by mouth 3 (three) times daily as needed for dizziness. 30 tablet 0  . metFORMIN (GLUCOPHAGE) 500 MG tablet Take 500 mg by mouth 2 (two) times daily.    . metolazone (ZAROXOLYN) 2.5 MG tablet If you gain more than 3 lbs over night ,take one zaroxolyn   (2.5 mg) that day (Patient taking differently: Take 2.5 mg by mouth daily as needed. If you gain more than 3 lbs over night ,take  one zaroxolyn   (2.5 mg) that day) 45 tablet 3  . potassium chloride SA (KLOR-CON M20) 20 MEQ tablet Take 1 tablet (20 mEq total) by mouth daily. 90 tablet 3  . predniSONE (DELTASONE) 10 MG tablet Take 4 tablets (40 mg total) by mouth daily. 20 tablet 0   No current facility-administered medications for this visit.    Allergies:  Patient has no known allergies.   Social History: The patient  reports that she has never smoked. She has never used smokeless tobacco. She reports that she does not drink alcohol or use drugs.   ROS:  Please see the history of present illness. Otherwise, complete review of systems is positive for memory deficits, gout  flare.  All other systems are reviewed and negative.   Physical Exam: VS:  BP 124/78   Pulse 89   SpO2 98% , BMI There is no height or weight on file to calculate BMI.  Wt Readings from Last 3 Encounters:  11/16/16 174 lb (78.9 kg)  08/07/16 160 lb (72.6 kg)  06/19/16 167 lb (75.8 kg)    General: Elderly woman in wheelchair, no distress. HEENT: Conjunctiva and lids normal, oropharynx clear. Neck: Supple, no elevated JVP, no thyromegaly. Lungs: Clear to auscultation, nonlabored breathing at rest. Cardiac: Regular rate and rhythm, S3 noted, soft systolic murmur, no pericardial rub. Abdomen: Soft, nontender, bowel sounds present. Extremities: Trace ankle edema, distal pulses 2+. Skin: Warm and dry. Musculoskeletal: No kyphosis. Neuropsychiatric: Alert and oriented x3, affect grossly appropriate.  ECG: I personally reviewed the tracing from 10/01/2015 which showed sinus rhythm with right bundle branch block and nonspecific ST changes.  Recent Labwork: 11/16/2016: BUN 20; Creatinine, Ser 1.47; Hemoglobin 12.5; Platelets 199; Potassium 3.3; Sodium 136     Component Value Date/Time   CHOL 139 07/03/2015 0652   TRIG 151 (H) 07/03/2015 0652   HDL 35 (L) 07/03/2015 0652   CHOLHDL 4.0 07/03/2015 0652   VLDL 30 07/03/2015 0652   LDLCALC 74 07/03/2015 0652    Other Studies Reviewed Today:  Echocardiogram 07/02/2015: Study Conclusions  - Left ventricle: The cavity size was normal. Wall thickness was   increased in a pattern of moderate LVH. Systolic function was   severely reduced. The estimated ejection fraction was in the   range of 20% to 25%. Diffuse hypokinesis. Features are consistent   with a pseudonormal left ventricular filling pattern, with   concomitant abnormal relaxation and increased filling pressure   (grade 2 diastolic dysfunction). - Aortic valve: Mildly calcified annulus. Trileaflet; mildly   thickened leaflets. Valve area (VTI): 2.18 cm^2. Valve area   (Vmax):  2.21 cm^2. - Mitral valve: Mildly calcified annulus. Mildly thickened leaflets. There    was moderate regurgitation. The MR vena contracta is 0.4   cm. - Left atrium: The atrium was severely dilated. - Right ventricle: The interventricular septum is flattened in   systole consistent with RV pressure overload. The cavity size was   mildly dilated. Systolic function was moderately reduced. - Right atrium: The atrium was mildly dilated. - Systemic veins: The IVC is dilated with normal respiratory   variation, estimated RA pressure is 8 mmHg. - Technically adequate study.  Assessment and Plan:  1. Chronic systolic heart failure. Weight is up in comparison to 2016, but she has better control of her leg edema and is not reporting any worsening shortness of breath. I did reinforce checking daily weights to better adjust diuretic therapy. We will continue her current regimen and  follow-up with a BMET.  2. History of nonobstructive CAD, no active angina symptoms. She continues on aspirin, not on statin with some prior intolerances.  3. Essential hypertension, blood pressure is adequately controlled today.  4. Nonischemic cardiomyopathy with LVEF 20-25% and moderate diastolic dysfunction. Continue with current medications for now. ICD not being pursued.  Current medicines were reviewed with the patient today.   Orders Placed This Encounter  Procedures  . Basic Metabolic Panel (BMET)  . EKG 12-Lead    Disposition: Follow-up in 6 months, sooner if needed.  Signed, Satira Sark, MD, [facility] 03/09/2017 8:31 AM    [place] [facility] [place]. [address], [place], [place] [address] Phone: [phone]; Fax: [phone]

## 2017-03-09 ENCOUNTER — Other Ambulatory Visit (HOSPITAL_COMMUNITY): Payer: Self-pay | Admitting: Nephrology

## 2017-03-09 ENCOUNTER — Ambulatory Visit (INDEPENDENT_AMBULATORY_CARE_PROVIDER_SITE_OTHER): Payer: Medicare Other | Admitting: Cardiology

## 2017-03-09 ENCOUNTER — Encounter: Payer: Self-pay | Admitting: Cardiology

## 2017-03-09 ENCOUNTER — Other Ambulatory Visit (HOSPITAL_COMMUNITY)
Admission: RE | Admit: 2017-03-09 | Discharge: 2017-03-09 | Disposition: A | Discharge status: Home / Self Care | Payer: Medicare Other | Source: Ambulatory Visit | Attending: Cardiology | Admitting: Cardiology

## 2017-03-09 ENCOUNTER — Ambulatory Visit (HOSPITAL_COMMUNITY): Admission: RE | Admit: 2017-03-09 | Payer: Medicare Other | Source: Ambulatory Visit

## 2017-03-09 VITALS — BP 124/78 | HR 89

## 2017-03-09 DIAGNOSIS — I10 Essential (primary) hypertension: Secondary | ICD-10-CM

## 2017-03-09 DIAGNOSIS — I428 Other cardiomyopathies: Secondary | ICD-10-CM

## 2017-03-09 DIAGNOSIS — I2510 Atherosclerotic heart disease of native coronary artery without angina pectoris: Secondary | ICD-10-CM | POA: Diagnosis not present

## 2017-03-09 DIAGNOSIS — I5022 Chronic systolic (congestive) heart failure: Secondary | ICD-10-CM

## 2017-03-09 LAB — BASIC METABOLIC PANEL
Anion gap: 7 (ref 5–15)
BUN: 30 mg/dL — ABNORMAL HIGH (ref 6–20)
CO2: 23 mmol/L (ref 22–32)
Calcium: 9.7 mg/dL (ref 8.9–10.3)
Chloride: 109 mmol/L (ref 101–111)
Creatinine, Ser: 1.55 mg/dL — ABNORMAL HIGH (ref 0.44–1.00)
GFR calc Af Amer: 36 mL/min — ABNORMAL LOW (ref >60)
GFR calc non Af Amer: 31 mL/min — ABNORMAL LOW (ref >60)
Glucose, Bld: 145 mg/dL — ABNORMAL HIGH (ref 65–99)
Potassium: 3.8 mmol/L (ref 3.5–5.1)
Sodium: 139 mmol/L (ref 135–145)

## 2017-03-09 NOTE — Patient Instructions (Addendum)
Your physician wants you to follow-up in:6 months You will receive a reminder letter in the mail two months in advance. If you don't receive a letter, please call our office to schedule the follow-up appointment.     Your physician recommends that you continue on your current medications as directed. Please refer to the Current Medication list given to you today.    If you need a refill on your cardiac medications before your next appointment, please call your pharmacy.  Get lab work today: BMET   Thank you for choosing [facility] !

## 2017-03-13 ENCOUNTER — Telehealth: Payer: Self-pay | Admitting: *Deleted

## 2017-03-13 NOTE — Telephone Encounter (Signed)
-----   Message from Satira Sark, MD sent at 03/09/2017  9:09 AM EDT ----- Results reviewed. Relatively stable, normal potassium and creatinine is 1.5 similar to December 2017. Continue with current regimen. A copy of this test should be forwarded to Jani Gravel, MD.

## 2017-03-13 NOTE — Telephone Encounter (Signed)
Called patient with test results. No answer. Left message to call back.  

## 2017-03-21 ENCOUNTER — Ambulatory Visit (HOSPITAL_COMMUNITY): Payer: Medicare Other | Attending: Nephrology

## 2017-03-21 ENCOUNTER — Encounter (HOSPITAL_COMMUNITY): Payer: Self-pay

## 2017-03-21 ENCOUNTER — Ambulatory Visit (HOSPITAL_COMMUNITY)
Admission: RE | Admit: 2017-03-21 | Discharge: 2017-03-21 | Disposition: A | Discharge status: Home / Self Care | Payer: Medicare Other | Source: Ambulatory Visit | Attending: Nephrology | Admitting: Nephrology

## 2017-05-21 ENCOUNTER — Emergency Department (HOSPITAL_COMMUNITY): Payer: Medicare Other

## 2017-05-21 ENCOUNTER — Emergency Department (HOSPITAL_COMMUNITY)
Admission: EM | Admit: 2017-05-21 | Discharge: 2017-05-21 | Disposition: A | Discharge status: Home / Self Care | Payer: Medicare Other | Attending: Emergency Medicine | Admitting: Emergency Medicine

## 2017-05-21 ENCOUNTER — Encounter (HOSPITAL_COMMUNITY): Payer: Self-pay | Admitting: *Deleted

## 2017-05-21 DIAGNOSIS — I5043 Acute on chronic combined systolic (congestive) and diastolic (congestive) heart failure: Secondary | ICD-10-CM | POA: Diagnosis not present

## 2017-05-21 DIAGNOSIS — I2510 Atherosclerotic heart disease of native coronary artery without angina pectoris: Secondary | ICD-10-CM | POA: Insufficient documentation

## 2017-05-21 DIAGNOSIS — J45909 Unspecified asthma, uncomplicated: Secondary | ICD-10-CM | POA: Diagnosis not present

## 2017-05-21 DIAGNOSIS — E119 Type 2 diabetes mellitus without complications: Secondary | ICD-10-CM | POA: Diagnosis not present

## 2017-05-21 DIAGNOSIS — M79671 Pain in right foot: Secondary | ICD-10-CM | POA: Diagnosis present

## 2017-05-21 DIAGNOSIS — M109 Gout, unspecified: Secondary | ICD-10-CM

## 2017-05-21 DIAGNOSIS — E039 Hypothyroidism, unspecified: Secondary | ICD-10-CM | POA: Insufficient documentation

## 2017-05-21 DIAGNOSIS — Z7984 Long term (current) use of oral hypoglycemic drugs: Secondary | ICD-10-CM | POA: Insufficient documentation

## 2017-05-21 DIAGNOSIS — Z7982 Long term (current) use of aspirin: Secondary | ICD-10-CM | POA: Insufficient documentation

## 2017-05-21 DIAGNOSIS — I110 Hypertensive heart disease with heart failure: Secondary | ICD-10-CM | POA: Insufficient documentation

## 2017-05-21 DIAGNOSIS — M10071 Idiopathic gout, right ankle and foot: Secondary | ICD-10-CM | POA: Insufficient documentation

## 2017-05-21 MED ORDER — HYDROCODONE-ACETAMINOPHEN 5-325 MG PO TABS: ORAL_TABLET | ORAL | 0 refills | Status: DC

## 2017-05-21 MED ORDER — HYDROCODONE-ACETAMINOPHEN 5-325 MG PO TABS
1.0000 | ORAL_TABLET | Freq: Once | ORAL | Status: AC
  Administered 2017-05-21: 1 via ORAL
  Filled 2017-05-21: qty 1

## 2017-05-21 MED ORDER — PREDNISONE 20 MG PO TABS: 40.0000 mg | ORAL_TABLET | Freq: Every day | ORAL | 0 refills | Status: DC

## 2017-05-21 NOTE — Discharge Instructions (Signed)
Take the prescriptions as directed.  Call your regular medical doctor tomorrow to schedule a follow up appointment within the next 3 days.  Return to the Emergency Department immediately sooner if worsening.

## 2017-05-21 NOTE — ED Notes (Signed)
Patient transported to X-ray 

## 2017-05-21 NOTE — ED Triage Notes (Signed)
Pt c/o right foot pain and swelling that started 2 days. Denies injury. Reports hx of gout.

## 2017-05-21 NOTE — ED Provider Notes (Signed)
[facility] DEPT Provider Note   CSN: [id number] Arrival date & time: 05/21/17  1134     History   Chief Complaint Chief Complaint  Patient presents with  . Foot Pain    rt ankle swelling    HPI [name] is a 81 y.o. female.  HPI  Pt was seen at 1240. Per pt and her family, c/o gradual onset and persistence of constant acute flair of her chronic "gout flair up" for the past 2 days. Pt c/o right foot pain and swelling. Describes the pain as per her usual gout pain pattern. Denies injury, no fevers, no rash, no focal motor weakness, no tingling/numbness in extremities.     Past Medical History:  Diagnosis Date  . Asthma   . Bronchitis   . Carotid artery disease ([place])   . Coronary atherosclerosis of native coronary artery    Nonobstructive at cath 2005  . DM2 (diabetes mellitus, type 2) ([place])   . Essential hypertension, benign   . Gout   . History of stroke   . Hypothyroidism   . Influenza 2013  . Nonischemic cardiomyopathy (HCC)    LVEF 20-25%  . Renal artery stenosis (HCC)    Bilateral  . Stroke [facility])     Patient Active Problem List   Diagnosis Date Noted  . Acute exacerbation of CHF (congestive heart failure) ([place]) 09/01/2015  . CHF (congestive heart failure) ([place]) 07/02/2015  . Elevated troponin I level 07/02/2015  . Right bundle branch block 07/02/2015  . Renal insufficiency 07/02/2015  . CAP (community acquired pneumonia) 04/25/2015  . Acute respiratory failure with hypoxia ([place]) 04/25/2015  . Acute renal injury ([place]) 04/25/2015  . DM2 (diabetes mellitus, type 2) ([place]) 04/25/2015  . ARF (acute renal failure) ([place]) 06/18/2014  . Mitral regurgitation 06/09/2014  . Dyspnea 06/06/2014  . Asthma exacerbation 06/06/2014  . Acute on chronic combined systolic and diastolic CHF (congestive heart failure) ([place]) 06/06/2014  . Influenza 12/10/2012  . HYPERLIPIDEMIA 07/08/2010  . Essential hypertension 04/06/2010  . CORONARY ATHEROSCLEROSIS NATIVE  CORONARY ARTERY 04/06/2010  . CARDIOMYOPATHY 03/29/2010    Past Surgical History:  Procedure Laterality Date  . ABDOMINAL HYSTERECTOMY      OB History    Gravida Para Term Preterm AB Living   15 15 15          SAB TAB Ectopic Multiple Live Births                   Home Medications    Prior to Admission medications   Medication Sig Start Date End Date Taking? Authorizing Provider  aspirin EC 81 MG EC tablet Take 1 tablet (81 mg total) by mouth daily. 07/06/15   Marjean Donna, MD  carvedilol (COREG) 6.25 MG tablet Take 1 tablet (6.25 mg total) by mouth 2 (two) times daily with a meal. Patient taking differently: Take 6.25 mg by mouth daily.  07/06/15   Marjean Donna, MD  furosemide (LASIX) 40 MG tablet Take Lasix 40 mg daily. MAY take additional 40 that day if she gains 2-3 lbs in a 24 hr period Patient taking differently: Take 40 mg by mouth daily. Take Lasix 40 mg daily. MAY take additional 40 that day if she gains 2-3 lbs in a 24 hr period 11/13/15   Satira Sark, MD  HYDROcodone-acetaminophen (NORCO/VICODIN) 5-325 MG tablet Take 2 tablets by mouth every 4 (four) hours as needed. 08/07/16   Fransico Meadow, PA-C  HYDROcodone-acetaminophen (NORCO/VICODIN) 5-325 MG tablet Take  1 tablet by mouth every 4 (four) hours as needed for moderate pain or severe pain. 11/16/16   Forde Dandy, MD  isosorbide mononitrate (IMDUR) 30 MG 24 hr tablet Take 0.5 tablets (15 mg total) by mouth daily. 07/06/15   Marjean Donna, MD  lisinopril (PRINIVIL,ZESTRIL) 5 MG tablet Take 1 tablet (5 mg total) by mouth daily. 07/06/15   Marjean Donna, MD  meclizine (ANTIVERT) 50 MG tablet Take 0.5 tablets (25 mg total) by mouth 3 (three) times daily as needed for dizziness. 08/16/15   Forde Dandy, MD  metFORMIN (GLUCOPHAGE) 500 MG tablet Take 500 mg by mouth 2 (two) times daily. 10/24/16   [provider]  metolazone (ZAROXOLYN) 2.5 MG tablet If you gain more than 3 lbs over night ,take one zaroxolyn    (2.5 mg) that day Patient taking differently: Take 2.5 mg by mouth daily as needed. If you gain more than 3 lbs over night ,take one zaroxolyn   (2.5 mg) that day 11/02/15   Satira Sark, MD  potassium chloride SA (KLOR-CON M20) 20 MEQ tablet Take 1 tablet (20 mEq total) by mouth daily. 10/26/15   Satira Sark, MD  predniSONE (DELTASONE) 10 MG tablet Take 4 tablets (40 mg total) by mouth daily. 11/16/16   Forde Dandy, MD    Family History Family History  Problem Relation Age of Onset  . Asthma Son     Social History Social History  Substance Use Topics  . Smoking status: Never Smoker  . Smokeless tobacco: Never Used  . Alcohol use No     Allergies   Patient has no known allergies.   Review of Systems Review of Systems ROS: Statement: All systems negative except as marked or noted in the HPI; Constitutional: Negative for fever and chills. ; ; Eyes: Negative for eye pain, redness and discharge. ; ; ENMT: Negative for ear pain, hoarseness, nasal congestion, sinus pressure and sore throat. ; ; Cardiovascular: Negative for chest pain, palpitations, diaphoresis, dyspnea and peripheral edema. ; ; Respiratory: Negative for cough, wheezing and stridor. ; ; Gastrointestinal: Negative for nausea, vomiting, diarrhea, abdominal pain, blood in stool, hematemesis, jaundice and rectal bleeding. . ; ; Genitourinary: Negative for dysuria, flank pain and hematuria. ; ; Musculoskeletal: Negative for back pain and neck pain. +right foot swelling and pain.; ; Skin: Negative for pruritus, rash, abrasions, blisters, bruising and skin lesion.; ; Neuro: Negative for headache, lightheadedness and neck stiffness. Negative for weakness, altered level of consciousness, altered mental status, extremity weakness, paresthesias, involuntary movement, seizure and syncope.       Physical Exam Updated Vital Signs BP 133/69 (BP Location: Left Arm)   Pulse 87   Temp 99.2 F (37.3 C) (Oral)   Resp 16    Ht 5\' 4"  (1.626 m)   Wt 81.6 kg (180 lb)   SpO2 96%   BMI 30.90 kg/m   Physical Exam 1245: Physical examination:  Nursing notes reviewed; Vital signs and O2 SAT reviewed;  Constitutional: Well developed, Well nourished, Well hydrated, In no acute distress; Head:  Normocephalic, atraumatic; Eyes: EOMI, PERRL, No scleral icterus; ENMT: Mouth and pharynx normal, Mucous membranes moist; Neck: Supple, Full range of motion, No lymphadenopathy; Cardiovascular: Regular rate and rhythm, No gallop; Respiratory: Breath sounds clear & equal bilaterally, No wheezes.  Speaking full sentences with ease, Normal respiratory effort/excursion; Chest: Nontender, Movement normal; Abdomen: Soft, Nontender, Nondistended, Normal bowel sounds; Genitourinary: No CVA tenderness; Extremities: Pulses normal, +mild right dorsal foot  tenderness with localized edema, no rash, no open wounds, no ecchymosis.  NT right ankle/knee/hip. No calf tenderness. edema or asymmetry.; Neuro: AA&Ox3, Major CN grossly intact.  Speech clear. No gross focal motor or sensory deficits in extremities.; Skin: Color normal, Warm, Dry.   ED Treatments / Results  Labs (all labs ordered are listed, but only abnormal results are displayed)   EKG  EKG Interpretation None       Radiology   Procedures Procedures (including critical care time)  Medications Ordered in ED Medications  HYDROcodone-acetaminophen (NORCO/VICODIN) 5-325 MG per tablet 1 tablet (1 tablet Oral Given 05/21/17 1246)     Initial Impression / Assessment and Plan / ED Course  I have reviewed the triage vital signs and the nursing notes.  Pertinent labs & imaging results that were available during my care of the patient were reviewed by me and considered in my medical decision making (see chart for details).  MDM Reviewed: previous chart, nursing note and vitals Interpretation: x-ray    Dg Ankle Complete Right Result Date: 05/21/2017 CLINICAL DATA:  Right  foot/ankle pain, swelling x2 days, history of gout EXAM: RIGHT ANKLE - COMPLETE 3+ VIEW COMPARISON:  None. FINDINGS: No fracture or dislocation is seen. The ankle mortise is intact. Mild degenerative changes of the dorsal midfoot. The base of the fifth metatarsal is unremarkable. Small plantar and posterior calcaneal enthesophytes. IMPRESSION: Negative. Electronically Signed   By: Julian Hy M.D.   On: 05/21/2017 13:26   Dg Foot Complete Right Result Date: 05/21/2017 CLINICAL DATA:  Right foot/ ankle pain, swelling x2 days, history of gout EXAM: RIGHT FOOT COMPLETE - 3+ VIEW COMPARISON:  None. FINDINGS: No fracture or dislocation is seen. The joint spaces are essentially preserved. No marginal erosions at the 1st MTP joint. Mild degenerative changes of the dorsal midfoot. Visualized soft tissues are within normal limits. Small plantar and posterior calcaneal enthesophytes. IMPRESSION: Negative. Electronically Signed   By: Julian Hy M.D.   On: 05/21/2017 13:27    1335:  XR reassuring. Tx symptomatically at this time, f/u PMD. Dx and testing d/w pt and family.  Questions answered.  Verb understanding, agreeable to d/c home with outpt f/u.   Final Clinical Impressions(s) / ED Diagnoses   Final diagnoses:  None    New Prescriptions New Prescriptions   No medications on file     Francine Graven, DO 05/24/17 6945

## 2017-05-21 NOTE — ED Notes (Signed)
Pt up to bedside commode

## 2017-08-18 ENCOUNTER — Emergency Department (HOSPITAL_COMMUNITY)
Admission: EM | Admit: 2017-08-18 | Discharge: 2017-08-18 | Disposition: A | Discharge status: Home / Self Care | Payer: Medicare Other | Attending: Emergency Medicine | Admitting: Emergency Medicine

## 2017-08-18 DIAGNOSIS — J45909 Unspecified asthma, uncomplicated: Secondary | ICD-10-CM | POA: Insufficient documentation

## 2017-08-18 DIAGNOSIS — E119 Type 2 diabetes mellitus without complications: Secondary | ICD-10-CM | POA: Diagnosis not present

## 2017-08-18 DIAGNOSIS — I5042 Chronic combined systolic (congestive) and diastolic (congestive) heart failure: Secondary | ICD-10-CM | POA: Diagnosis not present

## 2017-08-18 DIAGNOSIS — I2510 Atherosclerotic heart disease of native coronary artery without angina pectoris: Secondary | ICD-10-CM | POA: Insufficient documentation

## 2017-08-18 DIAGNOSIS — M25532 Pain in left wrist: Secondary | ICD-10-CM | POA: Insufficient documentation

## 2017-08-18 DIAGNOSIS — I110 Hypertensive heart disease with heart failure: Secondary | ICD-10-CM | POA: Insufficient documentation

## 2017-08-18 DIAGNOSIS — E039 Hypothyroidism, unspecified: Secondary | ICD-10-CM | POA: Diagnosis not present

## 2017-08-18 DIAGNOSIS — Z7984 Long term (current) use of oral hypoglycemic drugs: Secondary | ICD-10-CM | POA: Diagnosis not present

## 2017-08-18 DIAGNOSIS — Z79899 Other long term (current) drug therapy: Secondary | ICD-10-CM | POA: Diagnosis not present

## 2017-08-18 DIAGNOSIS — Z7982 Long term (current) use of aspirin: Secondary | ICD-10-CM | POA: Insufficient documentation

## 2017-08-18 MED ORDER — OXYCODONE HCL 5 MG PO TABS
5.0000 mg | ORAL_TABLET | Freq: Once | ORAL | Status: AC
  Administered 2017-08-18: 5 mg via ORAL
  Filled 2017-08-18: qty 1

## 2017-08-18 MED ORDER — OXYCODONE HCL 5 MG PO TABS
5.0000 mg | ORAL_TABLET | Freq: Four times a day (QID) | ORAL | 0 refills | Status: DC | PRN
Start: 2017-08-18 — End: 2018-04-24

## 2017-08-18 MED ORDER — PREDNISONE 20 MG PO TABS: ORAL_TABLET | ORAL | 0 refills | Status: DC

## 2017-08-18 MED ORDER — PREDNISONE 50 MG PO TABS
60.0000 mg | ORAL_TABLET | Freq: Once | ORAL | Status: AC
  Administered 2017-08-18: 60 mg via ORAL
  Filled 2017-08-18: qty 1

## 2017-08-18 NOTE — ED Provider Notes (Signed)
[facility] DEPT Provider Note   CSN: [id number] Arrival date & time: 08/18/17  1406     History   Chief Complaint Chief Complaint  Patient presents with  . Joint Swelling    HPI [name] is a 81 y.o. female.  HPI  81 yo F w/ one day of progressively worsening left wrist pain consistent with previous episodes of gout. No trauma, fevers, change in diet. Tried colchicine without significant relief. No systemic symptoms. No other associated or modifying symptoms.   Past Medical History:  Diagnosis Date  . Asthma   . Bronchitis   . Carotid artery disease ([place])   . Coronary atherosclerosis of native coronary artery    Nonobstructive at cath 2005  . DM2 (diabetes mellitus, type 2) ([place])   . Essential hypertension, benign   . Gout   . History of stroke   . Hypothyroidism   . Influenza 2013  . Nonischemic cardiomyopathy (HCC)    LVEF 20-25%  . Renal artery stenosis (HCC)    Bilateral  . Stroke [facility])     Patient Active Problem List   Diagnosis Date Noted  . Acute exacerbation of CHF (congestive heart failure) ([place]) 09/01/2015  . CHF (congestive heart failure) ([place]) 07/02/2015  . Elevated troponin I level 07/02/2015  . Right bundle branch block 07/02/2015  . Renal insufficiency 07/02/2015  . CAP (community acquired pneumonia) 04/25/2015  . Acute respiratory failure with hypoxia ([place]) 04/25/2015  . Acute renal injury ([place]) 04/25/2015  . DM2 (diabetes mellitus, type 2) ([place]) 04/25/2015  . ARF (acute renal failure) ([place]) 06/18/2014  . Mitral regurgitation 06/09/2014  . Dyspnea 06/06/2014  . Asthma exacerbation 06/06/2014  . Acute on chronic combined systolic and diastolic CHF (congestive heart failure) ([place]) 06/06/2014  . Influenza 12/10/2012  . HYPERLIPIDEMIA 07/08/2010  . Essential hypertension 04/06/2010  . CORONARY ATHEROSCLEROSIS NATIVE CORONARY ARTERY 04/06/2010  . CARDIOMYOPATHY 03/29/2010    Past Surgical History:  Procedure Laterality Date  .  ABDOMINAL HYSTERECTOMY      OB History    Gravida Para Term Preterm AB Living   15 15 15          SAB TAB Ectopic Multiple Live Births                   Home Medications    Prior to Admission medications   Medication Sig Start Date End Date Taking? Authorizing Provider  aspirin EC 81 MG EC tablet Take 1 tablet (81 mg total) by mouth daily. 07/06/15   Marjean Donna, MD  carvedilol (COREG) 6.25 MG tablet Take 1 tablet (6.25 mg total) by mouth 2 (two) times daily with a meal. Patient taking differently: Take 6.25 mg by mouth daily.  07/06/15   Marjean Donna, MD  furosemide (LASIX) 40 MG tablet Take Lasix 40 mg daily. MAY take additional 40 that day if she gains 2-3 lbs in a 24 hr period Patient taking differently: Take 40 mg by mouth daily. Take Lasix 40 mg daily. MAY take additional 40 that day if she gains 2-3 lbs in a 24 hr period 11/13/15   Satira Sark, MD  isosorbide mononitrate (IMDUR) 30 MG 24 hr tablet Take 0.5 tablets (15 mg total) by mouth daily. 07/06/15   Marjean Donna, MD  lisinopril (PRINIVIL,ZESTRIL) 5 MG tablet Take 1 tablet (5 mg total) by mouth daily. 07/06/15   Marjean Donna, MD  meclizine (ANTIVERT) 50 MG tablet Take 0.5 tablets (25 mg total) by mouth 3 (three) times  daily as needed for dizziness. 08/16/15   Forde Dandy, MD  metFORMIN (GLUCOPHAGE) 500 MG tablet Take 500 mg by mouth 2 (two) times daily. 10/24/16   [provider]  metolazone (ZAROXOLYN) 2.5 MG tablet If you gain more than 3 lbs over night ,take one zaroxolyn   (2.5 mg) that day Patient taking differently: Take 2.5 mg by mouth daily as needed. If you gain more than 3 lbs over night ,take one zaroxolyn   (2.5 mg) that day 11/02/15   Satira Sark, MD  oxyCODONE (ROXICODONE) 5 MG immediate release tablet Take 1 tablet (5 mg total) by mouth every 6 (six) hours as needed for severe pain. 08/18/17   Tarah Buboltz, Corene Cornea, MD  potassium chloride SA (KLOR-CON M20) 20 MEQ tablet Take 1 tablet (20 mEq  total) by mouth daily. 10/26/15   Satira Sark, MD  predniSONE (DELTASONE) 20 MG tablet 3 tabs po daily x 3 days, then 2 tabs x 3 days, then 1.5 tabs x 3 days, then 1 tab x 3 days, then 0.5 tabs x 3 days 08/18/17   Eean Buss, Corene Cornea, MD    Family History Family History  Problem Relation Age of Onset  . Asthma Son     Social History Social History  Substance Use Topics  . Smoking status: Never Smoker  . Smokeless tobacco: Never Used  . Alcohol use No     Allergies   Patient has no known allergies.   Review of Systems Review of Systems  All other systems reviewed and are negative.    Physical Exam Updated Vital Signs BP (!) 174/86 (BP Location: Right Arm)   Pulse 91   Temp 99.1 F (37.3 C) (Oral)   Resp 16   Ht 5\' 3"  (1.6 m)   Wt 86.6 kg (191 lb)   SpO2 94%   BMI 33.83 kg/m   Physical Exam  Constitutional: She appears well-developed and well-nourished.  HENT:  Head: Normocephalic and atraumatic.  Eyes: Conjunctivae and EOM are normal.  Neck: Normal range of motion.  Cardiovascular: Normal rate and regular rhythm.   Pulmonary/Chest: No stridor. No respiratory distress.  Abdominal: Soft. She exhibits no distension.  Musculoskeletal: She exhibits edema and tenderness (left wrist, mild warmth and erythema).  Neurological: She is alert.  Skin: Skin is warm and dry.  Nursing note and vitals reviewed.    ED Treatments / Results  Labs (all labs ordered are listed, but only abnormal results are displayed) Labs Reviewed - No data to display  EKG  EKG Interpretation None       Radiology No results found.  Procedures Procedures (including critical care time)  Medications Ordered in ED Medications  predniSONE (DELTASONE) tablet 60 mg (not administered)  oxyCODONE (Oxy IR/ROXICODONE) immediate release tablet 5 mg (not administered)     Initial Impression / Assessment and Plan / ED Course  I have reviewed the triage vital signs and the nursing  notes.  Pertinent labs & imaging results that were available during my care of the patient were reviewed by me and considered in my medical decision making (see chart for details).     Suspect gout flare as patient states it is exactly the same as multiple previous episodes. No fever or sytstemic symptoms to suggest septic arthritis. No indication for labs. stble for dc on steroids/oxycodone and PCP follow up.   Final Clinical Impressions(s) / ED Diagnoses   Final diagnoses:  Left wrist pain    New Prescriptions New Prescriptions  OXYCODONE (ROXICODONE) 5 MG IMMEDIATE RELEASE TABLET    Take 1 tablet (5 mg total) by mouth every 6 (six) hours as needed for severe pain.   PREDNISONE (DELTASONE) 20 MG TABLET    3 tabs po daily x 3 days, then 2 tabs x 3 days, then 1.5 tabs x 3 days, then 1 tab x 3 days, then 0.5 tabs x 3 days     Merrily Pew, MD 08/18/17 1523

## 2017-08-18 NOTE — ED Triage Notes (Addendum)
Patient c/o left hand swelling and pain. Patient states started yesterday. Patient has hx of gout in which she states this feels like and it that gout "flares up usually" in left hand and right foot. Patient takes Cholchine at home-took dose today per daughter. Denies and fevers. Tender to touch. Radial pulse present.

## 2017-08-18 NOTE — ED Notes (Signed)
Pt went home with daughter driving

## 2017-09-26 ENCOUNTER — Encounter (HOSPITAL_COMMUNITY): Payer: Self-pay | Admitting: *Deleted

## 2017-09-26 ENCOUNTER — Emergency Department (HOSPITAL_COMMUNITY)
Admission: EM | Admit: 2017-09-26 | Discharge: 2017-09-26 | Disposition: A | Discharge status: Home / Self Care | Payer: Medicare Other | Attending: Emergency Medicine | Admitting: Emergency Medicine

## 2017-09-26 DIAGNOSIS — I509 Heart failure, unspecified: Secondary | ICD-10-CM | POA: Diagnosis not present

## 2017-09-26 DIAGNOSIS — I2510 Atherosclerotic heart disease of native coronary artery without angina pectoris: Secondary | ICD-10-CM | POA: Insufficient documentation

## 2017-09-26 DIAGNOSIS — Z79899 Other long term (current) drug therapy: Secondary | ICD-10-CM | POA: Diagnosis not present

## 2017-09-26 DIAGNOSIS — I110 Hypertensive heart disease with heart failure: Secondary | ICD-10-CM | POA: Diagnosis not present

## 2017-09-26 DIAGNOSIS — J45909 Unspecified asthma, uncomplicated: Secondary | ICD-10-CM | POA: Diagnosis not present

## 2017-09-26 DIAGNOSIS — E039 Hypothyroidism, unspecified: Secondary | ICD-10-CM | POA: Insufficient documentation

## 2017-09-26 DIAGNOSIS — E119 Type 2 diabetes mellitus without complications: Secondary | ICD-10-CM | POA: Insufficient documentation

## 2017-09-26 DIAGNOSIS — T782XXA Anaphylactic shock, unspecified, initial encounter: Secondary | ICD-10-CM | POA: Diagnosis not present

## 2017-09-26 DIAGNOSIS — Z7984 Long term (current) use of oral hypoglycemic drugs: Secondary | ICD-10-CM | POA: Insufficient documentation

## 2017-09-26 DIAGNOSIS — T7840XA Allergy, unspecified, initial encounter: Secondary | ICD-10-CM | POA: Diagnosis present

## 2017-09-26 MED ORDER — METHYLPREDNISOLONE SODIUM SUCC 125 MG IJ SOLR
INTRAMUSCULAR | Status: AC
  Administered 2017-09-26: 125 mg
  Filled 2017-09-26: qty 2

## 2017-09-26 MED ORDER — DIPHENHYDRAMINE HCL 25 MG PO CAPS: 25.0000 mg | ORAL_CAPSULE | ORAL | 0 refills | Status: DC | PRN

## 2017-09-26 MED ORDER — EPINEPHRINE 0.3 MG/0.3ML IJ SOAJ
0.3000 mg | Freq: Once | INTRAMUSCULAR | Status: AC
  Administered 2017-09-26: 0.3 mg via INTRAMUSCULAR
  Filled 2017-09-26: qty 0.3

## 2017-09-26 MED ORDER — PREDNISONE 10 MG PO TABS: 40.0000 mg | ORAL_TABLET | Freq: Every day | ORAL | 0 refills | Status: DC

## 2017-09-26 MED ORDER — DIPHENHYDRAMINE HCL 50 MG/ML IJ SOLN
INTRAMUSCULAR | Status: AC
  Administered 2017-09-26: 50 mg
  Filled 2017-09-26: qty 1

## 2017-09-26 MED ORDER — IPRATROPIUM-ALBUTEROL 0.5-2.5 (3) MG/3ML IN SOLN
3.0000 mL | Freq: Once | RESPIRATORY_TRACT | Status: AC
  Administered 2017-09-26: 3 mL via RESPIRATORY_TRACT
  Filled 2017-09-26: qty 3

## 2017-09-26 MED ORDER — EPINEPHRINE 0.3 MG/0.3ML IJ SOAJ: 0.3000 mg | Freq: Once | INTRAMUSCULAR | 0 refills | Status: DC | PRN

## 2017-09-26 MED ORDER — FAMOTIDINE IN NACL 20-0.9 MG/50ML-% IV SOLN
INTRAVENOUS | Status: AC
  Administered 2017-09-26: 20 mg
  Filled 2017-09-26: qty 50

## 2017-09-26 MED ORDER — FAMOTIDINE 20 MG PO TABS: 20.0000 mg | ORAL_TABLET | Freq: Every day | ORAL | 0 refills | Status: DC

## 2017-09-26 NOTE — Discharge Instructions (Signed)
Please take steroids, benadryl, pepcid as needed for allergic reaction.  Give epipen for severe allergic reaction into the thigh and hold for 5 seconds. Give only for difficulty breathing, swelling in the mouth or throat, and hives WITH nausea/vomiting, difficulty breathing, passing out or near passing out.   Return for worsening symptoms, including confusion, difficulty breathing, passing out, if you have to give yourself the epipen, or any other symptoms concerning to you.

## 2017-09-26 NOTE — ED Notes (Signed)
Pt states she feels better at this time.

## 2017-09-26 NOTE — ED Notes (Signed)
Pt alert & oriented x4, stable gait. Patient given discharge instructions, paperwork & prescription(s). Patient verbalized understanding. Pt left department in wheelchair escorted by staff. Pt left w/ no further questions. 

## 2017-09-26 NOTE — ED Provider Notes (Signed)
[facility] EMERGENCY DEPARTMENT Provider Note   CSN: [id number] Arrival date & time: 09/26/17  1611     History   Chief Complaint Chief Complaint  Patient presents with  . Allergic Reaction    HPI [name] is a 81 y.o. female.  The history is provided by the patient.  Allergic Reaction  Presenting symptoms: itching and rash   Presenting symptoms: no difficulty breathing, no difficulty swallowing and no swelling   Severity:  Moderate Prior allergic episodes:  No prior episodes Context: insect bite/sting   Relieved by:  None tried Worsened by:  Nothing Ineffective treatments:  None tried  81 year old female who presents with allergic reaction just prior to arrival. History of asthma, CAD, DM, HTN, and prior CVA. Stung by a yellow jacket just prior to arrival on the top of her head. Immediately after, developed itching all over. Denies n/v, chest pain, abdominal pain, difficulty breathing, tongue or throat swelling.   Past Medical History:  Diagnosis Date  . Asthma   . Bronchitis   . Carotid artery disease ([place])   . Coronary atherosclerosis of native coronary artery    Nonobstructive at cath 2005  . DM2 (diabetes mellitus, type 2) ([place])   . Essential hypertension, benign   . Gout   . History of stroke   . Hypothyroidism   . Influenza 2013  . Nonischemic cardiomyopathy (HCC)    LVEF 20-25%  . Renal artery stenosis (HCC)    Bilateral  . Stroke [facility])     Patient Active Problem List   Diagnosis Date Noted  . Acute exacerbation of CHF (congestive heart failure) ([place]) 09/01/2015  . CHF (congestive heart failure) ([place]) 07/02/2015  . Elevated troponin I level 07/02/2015  . Right bundle branch block 07/02/2015  . Renal insufficiency 07/02/2015  . CAP (community acquired pneumonia) 04/25/2015  . Acute respiratory failure with hypoxia ([place]) 04/25/2015  . Acute renal injury ([place]) 04/25/2015  . DM2 (diabetes mellitus, type 2) ([place]) 04/25/2015  . ARF (acute  renal failure) ([place]) 06/18/2014  . Mitral regurgitation 06/09/2014  . Dyspnea 06/06/2014  . Asthma exacerbation 06/06/2014  . Acute on chronic combined systolic and diastolic CHF (congestive heart failure) ([place]) 06/06/2014  . Influenza 12/10/2012  . HYPERLIPIDEMIA 07/08/2010  . Essential hypertension 04/06/2010  . CORONARY ATHEROSCLEROSIS NATIVE CORONARY ARTERY 04/06/2010  . CARDIOMYOPATHY 03/29/2010    Past Surgical History:  Procedure Laterality Date  . ABDOMINAL HYSTERECTOMY      OB History    Gravida Para Term Preterm AB Living   15 15 15          SAB TAB Ectopic Multiple Live Births                   Home Medications    Prior to Admission medications   Medication Sig Start Date End Date Taking? Authorizing Provider  allopurinol (ZYLOPRIM) 100 MG tablet TAKE ONE TABLET BY MOUTH ONCE DAILY FOR GOUT 07/01/17  Yes [provider]  carvedilol (COREG) 6.25 MG tablet Take 1 tablet (6.25 mg total) by mouth 2 (two) times daily with a meal. Patient taking differently: Take 6.25 mg by mouth daily.  07/06/15  Yes McInnis, Angus, MD  colchicine 0.6 MG tablet TAKE ONE TABLET BY MOUTH ONCE DAILY FOR GOUT 08/29/17  Yes [provider]  donepezil (ARICEPT) 10 MG tablet Take 10 mg by mouth daily.   Yes [provider]  furosemide (LASIX) 40 MG tablet Take Lasix 40 mg daily.  MAY take additional 40 that day if she gains 2-3 lbs in a 24 hr period Patient taking differently: Take 20 mg by mouth daily.  11/13/15  Yes Satira Sark, MD  gabapentin (NEURONTIN) 100 MG capsule Take 100 mg by mouth daily.   Yes [provider]  isosorbide dinitrate (ISORDIL) 30 MG tablet Take 30 mg by mouth daily. 08/30/17  Yes [provider]  oxyCODONE (ROXICODONE) 5 MG immediate release tablet Take 1 tablet (5 mg total) by mouth every 6 (six) hours as needed for severe pain. 08/18/17  Yes Mesner, Corene Cornea, MD  pioglitazone (ACTOS) 15 MG tablet TAKE ONE TABLET BY MOUTH ONCE  DAILY FOR DIABETES 08/12/17  Yes [provider]  sitaGLIPtin (JANUVIA) 50 MG tablet Take 50 mg by mouth daily.   Yes [provider]  diphenhydrAMINE (BENADRYL) 25 mg capsule Take 1 capsule (25 mg total) by mouth every 4 (four) hours as needed for itching (or hives). 09/26/17   Forde Dandy, MD  EPINEPHrine 0.3 mg/0.3 mL IJ SOAJ injection Inject 0.3 mLs (0.3 mg total) into the muscle once as needed (severe allergic reaction). 09/26/17   Forde Dandy, MD  famotidine (PEPCID) 20 MG tablet Take 1 tablet (20 mg total) by mouth daily. 09/26/17   Forde Dandy, MD  predniSONE (DELTASONE) 10 MG tablet Take 4 tablets (40 mg total) by mouth daily. 09/26/17   Forde Dandy, MD  predniSONE (DELTASONE) 20 MG tablet 3 tabs po daily x 3 days, then 2 tabs x 3 days, then 1.5 tabs x 3 days, then 1 tab x 3 days, then 0.5 tabs x 3 days Patient not taking: Reported on 09/26/2017 08/18/17   Mesner, Corene Cornea, MD    Family History Family History  Problem Relation Age of Onset  . Asthma Son     Social History Social History  Substance Use Topics  . Smoking status: Never Smoker  . Smokeless tobacco: Never Used  . Alcohol use No     Allergies   Yellow jacket venom [bee venom]   Review of Systems Review of Systems  Constitutional: Negative for fever.  HENT: Negative for trouble swallowing.   Respiratory: Negative for shortness of breath.   Cardiovascular: Negative for chest pain.  Gastrointestinal: Negative for abdominal pain, nausea and vomiting.  Skin: Positive for itching and rash.  All other systems reviewed and are negative.    Physical Exam Updated Vital Signs BP (!) 153/85 (BP Location: Right Arm)   Pulse 86   Temp 98.3 F (36.8 C) (Oral)   Resp 18   Ht 5\' 3"  (1.6 m)   Wt 86.6 kg (191 lb)   SpO2 96%   BMI 33.83 kg/m   Physical Exam Physical Exam  Nursing note and vitals reviewed. Constitutional: Well developed, well nourished, non-toxic, and in no acute  distress Head: Normocephalic and atraumatic.  Mouth/Throat: Oropharynx is clear and moist.  Neck: Normal range of motion. Neck supple.  Cardiovascular: Normal rate and regular rhythm.   Pulmonary/Chest: Effort normal. Scattered expiratory wheezing.  Abdominal: Soft. There is no tenderness. There is no rebound and no guarding.  Musculoskeletal: Normal range of motion.  Neurological: Alert, no facial droop, fluent speech, moves all extremities symmetrically Skin: Skin is warm and dry.  Psychiatric: Cooperative   ED Treatments / Results  Labs (all labs ordered are listed, but only abnormal results are displayed) Labs Reviewed - No data to display  EKG  EKG Interpretation None  Radiology No results found.  Procedures Procedures (including critical care time) CRITICAL CARE Performed by: Forde Dandy   Total critical care time: 35 minutes  Critical care time was exclusive of separately billable procedures and treating other patients.  Critical care was necessary to treat or prevent imminent or life-threatening deterioration.  Critical care was time spent personally by me on the following activities: development of treatment plan with patient and/or surrogate as well as nursing, discussions with consultants, evaluation of patient's response to treatment, examination of patient, obtaining history from patient or surrogate, ordering and performing treatments and interventions, ordering and review of laboratory studies, ordering and review of radiographic studies, pulse oximetry and re-evaluation of patient's condition.  Medications Ordered in ED Medications  methylPREDNISolone sodium succinate (SOLU-MEDROL) 125 mg/2 mL injection (125 mg  Given 09/26/17 1702)  diphenhydrAMINE (BENADRYL) 50 MG/ML injection (50 mg  Given 09/26/17 1702)  famotidine (PEPCID) 20-0.9 MG/50ML-% IVPB (  Stopped 09/26/17 1757)  ipratropium-albuterol (DUONEB) 0.5-2.5 (3) MG/3ML nebulizer solution 3 mL  (3 mLs Nebulization Given 09/26/17 1735)  EPINEPHrine (EPI-PEN) injection 0.3 mg (0.3 mg Intramuscular Given 09/26/17 1919)     Initial Impression / Assessment and Plan / ED Course  I have reviewed the triage vital signs and the nursing notes.  Pertinent labs & imaging results that were available during my care of the patient were reviewed by me and considered in my medical decision making (see chart for details).     Presents with allergic reaction after being stung by yellow jackets. She is nontoxic in no acute distress. Does have evidence of hives over her upper extremities and torso. Wheezing on lung exam, but no hypoxia reported shortness of breath. Initially given Solu-Medrol, Benadryl, Pepcid and duoneb. During ED course, she begins to complain of tongue swelling and mouth swelling. Subsequently given IM epinephrine as with these multiple symptoms concerning for possible anaphylaxis. She is observed for 4 hours after epinephrine. She continues to feel well, with no swelling of the tongue or throat, difficulty breathing or wheezing, or other signs of allergic reaction. Eating and drinking normally. Feels back to baseline. Felt stable for discharge home with outpatient management.  Discussed home indications for epipen with patient and family. Strict return and follow-up instructions reviewed. She and family expressed understanding of all discharge instructions and felt comfortable with the plan of care.   Final Clinical Impressions(s) / ED Diagnoses   Final diagnoses:  Anaphylaxis, initial encounter    New Prescriptions New Prescriptions   DIPHENHYDRAMINE (BENADRYL) 25 MG CAPSULE    Take 1 capsule (25 mg total) by mouth every 4 (four) hours as needed for itching (or hives).   EPINEPHRINE 0.3 MG/0.3 ML IJ SOAJ INJECTION    Inject 0.3 mLs (0.3 mg total) into the muscle once as needed (severe allergic reaction).   FAMOTIDINE (PEPCID) 20 MG TABLET    Take 1 tablet (20 mg total) by mouth  daily.   PREDNISONE (DELTASONE) 10 MG TABLET    Take 4 tablets (40 mg total) by mouth daily.     Forde Dandy, MD 09/26/17 [phone]

## 2017-09-26 NOTE — ED Triage Notes (Addendum)
Pt was stung by a yellow jacket on the head and started having difficulty breathing afterwards and itching all over. Pt is wheezing in triage. O2 sat 100%.

## 2017-09-26 NOTE — ED Notes (Signed)
Pt states her tongue fell more normal at this time.

## 2017-12-25 ENCOUNTER — Other Ambulatory Visit: Payer: Self-pay

## 2018-03-14 NOTE — Progress Notes (Deleted)
Cardiology Office Note  Date: 03/14/2018   ID: [name], DOB Mar 13, 1936, MRN [id number]  PCP: Katie Gravel, MD  Primary Cardiologist: Katie Lesches, MD   No chief complaint on file.   History of Present Illness: [name] is a 82 y.o. female last seen in March 2018.  Past Medical History:  Diagnosis Date  . Asthma   . Bronchitis   . Carotid artery disease ([place])   . Coronary atherosclerosis of native coronary artery    Nonobstructive at cath 2005  . DM2 (diabetes mellitus, type 2) ([place])   . Essential hypertension, benign   . Gout   . History of stroke   . Hypothyroidism   . Influenza 2013  . Nonischemic cardiomyopathy (H[facility])    LVEF 20-25%  . Renal artery stenosis ([facility])    Bilateral  . Stroke [facility])     Past Surgical History:  Procedure Laterality Date  . ABDOMINAL HYSTERECTOMY      Current Outpatient Medications  Medication Sig Dispense Refill  . allopurinol (ZYLOPRIM) 100 MG tablet TAKE ONE TABLET BY MOUTH ONCE DAILY FOR GOUT  3  . carvedilol (COREG) 6.25 MG tablet Take 1 tablet (6.25 mg total) by mouth 2 (two) times daily with a meal. (Patient taking differently: Take 6.25 mg by mouth daily. ) 60 tablet 5  . colchicine 0.6 MG tablet TAKE ONE TABLET BY MOUTH ONCE DAILY FOR GOUT  3  . diphenhydrAMINE (BENADRYL) 25 mg capsule Take 1 capsule (25 mg total) by mouth every 4 (four) hours as needed for itching (or hives). 30 capsule 0  . donepezil (ARICEPT) 10 MG tablet Take 10 mg by mouth daily.    Marland Kitchen EPINEPHrine 0.3 mg/0.3 mL IJ SOAJ injection Inject 0.3 mLs (0.3 mg total) into the muscle once as needed (severe allergic reaction). 1 Device 0  . famotidine (PEPCID) 20 MG tablet Take 1 tablet (20 mg total) by mouth daily. 7 tablet 0  . furosemide (LASIX) 40 MG tablet Take Lasix 40 mg daily. MAY take additional 40 that day if she gains 2-3 lbs in a 24 hr period (Patient taking differently: Take 20 mg by mouth daily. ) 90 tablet 3  . gabapentin (NEURONTIN) 100  MG capsule Take 100 mg by mouth daily.    . isosorbide dinitrate (ISORDIL) 30 MG tablet Take 30 mg by mouth daily.  3  . oxyCODONE (ROXICODONE) 5 MG immediate release tablet Take 1 tablet (5 mg total) by mouth every 6 (six) hours as needed for severe pain. 15 tablet 0  . pioglitazone (ACTOS) 15 MG tablet TAKE ONE TABLET BY MOUTH ONCE DAILY FOR DIABETES  12  . predniSONE (DELTASONE) 10 MG tablet Take 4 tablets (40 mg total) by mouth daily. 20 tablet 0  . predniSONE (DELTASONE) 20 MG tablet 3 tabs po daily x 3 days, then 2 tabs x 3 days, then 1.5 tabs x 3 days, then 1 tab x 3 days, then 0.5 tabs x 3 days (Patient not taking: Reported on 09/26/2017) 27 tablet 0  . sitaGLIPtin (JANUVIA) 50 MG tablet Take 50 mg by mouth daily.     No current facility-administered medications for this visit.    Allergies:  Yellow jacket venom [bee venom]   Social History: The patient  reports that she has never smoked. She has never used smokeless tobacco. She reports that she does not drink alcohol or use drugs.   Family History: The patient's family history includes Asthma in her son.  ROS:  Please see the history of present illness. Otherwise, complete review of systems is positive for {NONE DEFAULTED:18576::"none"}.  All other systems are reviewed and negative.   Physical Exam: VS:  There were no vitals taken for this visit., BMI There is no height or weight on file to calculate BMI.  Wt Readings from Last 3 Encounters:  09/26/17 191 lb (86.6 kg)  08/18/17 191 lb (86.6 kg)  05/21/17 180 lb (81.6 kg)    General: Patient appears comfortable at rest. HEENT: Conjunctiva and lids normal, oropharynx clear with moist mucosa. Neck: Supple, no elevated JVP or carotid bruits, no thyromegaly. Lungs: Clear to auscultation, nonlabored breathing at rest. Cardiac: Regular rate and rhythm, no S3 or significant systolic murmur, no pericardial rub. Abdomen: Soft, nontender, no hepatomegaly, bowel sounds present, no  guarding or rebound. Extremities: No pitting edema, distal pulses 2+. Skin: Warm and dry. Musculoskeletal: No kyphosis. Neuropsychiatric: Alert and oriented x3, affect grossly appropriate.  ECG: I personally reviewed the tracing from 03/09/2017 which showed sinus rhythm with right bundle branch block.  Recent Labwork:    Component Value Date/Time   CHOL 139 07/03/2015 0652   TRIG 151 (H) 07/03/2015 0652   HDL 35 (L) 07/03/2015 0652   CHOLHDL 4.0 07/03/2015 0652   VLDL 30 07/03/2015 0652   LDLCALC 74 07/03/2015 0652  March 2018: Potassium 3.8, BUN 30, creatinine 1.55  Other Studies Reviewed Today:  Echocardiogram 07/02/2015: Study Conclusions  - Left ventricle: The cavity size was normal. Wall thickness was   increased in a pattern of moderate LVH. Systolic function was   severely reduced. The estimated ejection fraction was in the   range of 20% to 25%. Diffuse hypokinesis. Features are consistent   with a pseudonormal left ventricular filling pattern, with   concomitant abnormal relaxation and increased filling pressure   (grade 2 diastolic dysfunction). - Aortic valve: Mildly calcified annulus. Trileaflet; mildly   thickened leaflets. Valve area (VTI): 2.18 cm^2. Valve area   (Vmax): 2.21 cm^2. - Mitral valve: Mildly calcified annulus. Mildly thickened leaflets   . There was moderate regurgitation. The MR vena contracta is 0.4   cm. - Left atrium: The atrium was severely dilated. - Right ventricle: The interventricular septum is flattened in   systole consistent with RV pressure overload. The cavity size was   mildly dilated. Systolic function was moderately reduced. - Right atrium: The atrium was mildly dilated. - Systemic veins: The IVC is dilated with normal respiratory   variation, estimated RA pressure is 8 mmHg. - Technically adequate study.  Assessment and Plan:   Current medicines were reviewed with the patient today.  No orders of the defined types were  placed in this encounter.   Disposition:  Signed, Satira Sark, MD, [facility] 03/14/2018 10:18 AM    [facility]. [address] [place], [place] [address] Phone: [phone]; Fax: [phone]

## 2018-03-15 ENCOUNTER — Ambulatory Visit: Payer: Medicare Other | Admitting: Cardiology

## 2018-03-16 ENCOUNTER — Encounter: Payer: Self-pay | Admitting: Cardiology

## 2018-04-20 NOTE — Progress Notes (Signed)
Cardiology Office Note  Date: 04/23/2018   ID: [name], DOB 1936/09/18, MRN [id number]  PCP: Jani Gravel, MD  Primary Cardiologist: Rozann Lesches, MD   Chief Complaint  Patient presents with  . Cardiomyopathy    History of Present Illness: [name] is a 82 y.o. female last seen in March 2018.  He is here today for follow-up with family member.  She has had a recent cough with chest congestion, no obvious fevers or chills.  Weight is up about 5 pounds compared to last year.  Since I saw her she has been taken off lisinopril.  Blood pressure is elevated today.  She otherwise continues on Coreg, Lasix, and isosorbide dinitrate.  Echocardiogram in 2016 revealed LVEF 20 to 25% range with diffuse hypokinesis.  She is being managed conservatively.  I personally reviewed her ECG today which shows sinus rhythm with PAC and right bundle branch block.  Past Medical History:  Diagnosis Date  . Asthma   . Bronchitis   . Carotid artery disease ([place])   . Coronary atherosclerosis of native coronary artery    Nonobstructive at cath 2005  . DM2 (diabetes mellitus, type 2) ([place])   . Essential hypertension, benign   . Gout   . History of stroke   . Hypothyroidism   . Influenza 2013  . Nonischemic cardiomyopathy (H[facility])    LVEF 20-25%  . Renal artery stenosis ([facility])    Bilateral  . Stroke [facility])     Past Surgical History:  Procedure Laterality Date  . ABDOMINAL HYSTERECTOMY      Current Outpatient Medications  Medication Sig Dispense Refill  . allopurinol (ZYLOPRIM) 100 MG tablet TAKE ONE TABLET BY MOUTH ONCE DAILY FOR GOUT  3  . carvedilol (COREG) 6.25 MG tablet Take 1 tablet (6.25 mg total) by mouth 2 (two) times daily with a meal. (Patient taking differently: Take 6.25 mg by mouth daily. ) 60 tablet 5  . colchicine 0.6 MG tablet TAKE ONE TABLET BY MOUTH ONCE DAILY FOR GOUT  3  . diphenhydrAMINE (BENADRYL) 25 mg capsule Take 1 capsule (25 mg total) by mouth every 4  (four) hours as needed for itching (or hives). 30 capsule 0  . donepezil (ARICEPT) 10 MG tablet Take 10 mg by mouth daily.    Marland Kitchen EPINEPHrine 0.3 mg/0.3 mL IJ SOAJ injection Inject 0.3 mLs (0.3 mg total) into the muscle once as needed (severe allergic reaction). 1 Device 0  . famotidine (PEPCID) 20 MG tablet Take 1 tablet (20 mg total) by mouth daily. 7 tablet 0  . furosemide (LASIX) 40 MG tablet Take Lasix 40 mg daily. MAY take additional 40 that day if she gains 2-3 lbs in a 24 hr period (Patient taking differently: Take 20 mg by mouth daily. ) 90 tablet 3  . gabapentin (NEURONTIN) 100 MG capsule Take 100 mg by mouth daily.    . isosorbide dinitrate (ISORDIL) 30 MG tablet Take 30 mg by mouth daily.  3  . oxyCODONE (ROXICODONE) 5 MG immediate release tablet Take 1 tablet (5 mg total) by mouth every 6 (six) hours as needed for severe pain. 15 tablet 0  . pioglitazone (ACTOS) 15 MG tablet TAKE ONE TABLET BY MOUTH ONCE DAILY FOR DIABETES  12  . predniSONE (DELTASONE) 10 MG tablet Take 4 tablets (40 mg total) by mouth daily. 20 tablet 0  . predniSONE (DELTASONE) 20 MG tablet 3 tabs po daily x 3 days, then 2 tabs x 3 days,  then 1.5 tabs x 3 days, then 1 tab x 3 days, then 0.5 tabs x 3 days 27 tablet 0  . sitaGLIPtin (JANUVIA) 50 MG tablet Take 50 mg by mouth daily.     No current facility-administered medications for this visit.    Allergies:  Yellow jacket venom [bee venom]   Social History: The patient  reports that she has never smoked. She has never used smokeless tobacco. She reports that she does not drink alcohol or use drugs.   ROS:  Please see the history of present illness. Otherwise, complete review of systems is positive for memory loss.  All other systems are reviewed and negative.   Physical Exam: VS:  BP (!) 165/90   Pulse 98   Ht 5\' 3"  (1.6 m)   Wt 196 lb (88.9 kg)   SpO2 93%   BMI 34.72 kg/m , BMI Body mass index is 34.72 kg/m.  Wt Readings from Last 3 Encounters:  04/23/18  196 lb (88.9 kg)  09/26/17 191 lb (86.6 kg)  08/18/17 191 lb (86.6 kg)    General: Elderly woman, wearing a respiratory mask.  No distress. HEENT: Conjunctiva and lids normal. Neck: Supple, no elevated JVP or carotid bruits, no thyromegaly. Lungs: No wheezing, nonlabored breathing at rest. Cardiac: Regular rate and rhythm with ectopy, no S3, soft systolic murmur, no pericardial rub. Abdomen: Soft, nontender, bowel sounds present. Extremities: 1+ lower leg edema, distal pulses 2+. Skin: Warm and dry. Musculoskeletal: No kyphosis. Neuropsychiatric: Alert and oriented x3, affect grossly appropriate.  ECG: I personally reviewed the tracing from 03/09/2017 which showed sinus rhythm with right bundle branch block.  Recent Labwork:  March 2018: Potassium 3.8, BUN 30, creatinine 1.55  Other Studies Reviewed Today:  Echocardiogram 07/02/2015: Study Conclusions  - Left ventricle: The cavity size was normal. Wall thickness was   increased in a pattern of moderate LVH. Systolic function was   severely reduced. The estimated ejection fraction was in the   range of 20% to 25%. Diffuse hypokinesis. Features are consistent   with a pseudonormal left ventricular filling pattern, with   concomitant abnormal relaxation and increased filling pressure   (grade 2 diastolic dysfunction). - Aortic valve: Mildly calcified annulus. Trileaflet; mildly   thickened leaflets. Valve area (VTI): 2.18 cm^2. Valve area   (Vmax): 2.21 cm^2. - Mitral valve: Mildly calcified annulus. Mildly thickened leaflets   . There was moderate regurgitation. The MR vena contracta is 0.4   cm. - Left atrium: The atrium was severely dilated. - Right ventricle: The interventricular septum is flattened in   systole consistent with RV pressure overload. The cavity size was   mildly dilated. Systolic function was moderately reduced. - Right atrium: The atrium was mildly dilated. - Systemic veins: The IVC is dilated with normal  respiratory   variation, estimated RA pressure is 8 mmHg. - Technically adequate study.  Assessment and Plan:  1.  Chronic systolic heart failure.  Her weight is up about 5 pounds.  I have asked her to increase Lasix to 80 mg daily for the next 3 days and return to 40 mg daily.  Also discussed watching daily weights and salt restriction.  2.  Nonischemic cardiomyopathy with LVEF 20 to 25%.  Continue Coreg, Lasix, and isosorbide dinitrate.  Add hydralazine 25 mg twice daily for now.  She has history of renal insufficiency, now off ACE inhibitor.  Do not plan to start Aldactone.  3.  Essential hypertension, blood pressure not optimally controlled.  Hydralazine being started.  4.  Obstructive CAD without active angina.  She is on aspirin.  Current medicines were reviewed with the patient today.   Orders Placed This Encounter  Procedures  . EKG 12-Lead    Disposition: Follow-up in 6 months.  Signed, Satira Sark, MD, [facility] 04/23/2018 1:38 PM    [place] [facility] [address] [place], [place] [address] Phone: [phone]; Fax: [phone]

## 2018-04-23 ENCOUNTER — Ambulatory Visit (INDEPENDENT_AMBULATORY_CARE_PROVIDER_SITE_OTHER): Payer: Medicare Other | Admitting: Cardiology

## 2018-04-23 ENCOUNTER — Encounter: Payer: Self-pay | Admitting: Cardiology

## 2018-04-23 VITALS — BP 165/90 | HR 98 | Ht 63.0 in | Wt 196.0 lb

## 2018-04-23 DIAGNOSIS — I2510 Atherosclerotic heart disease of native coronary artery without angina pectoris: Secondary | ICD-10-CM | POA: Diagnosis not present

## 2018-04-23 DIAGNOSIS — I5022 Chronic systolic (congestive) heart failure: Secondary | ICD-10-CM

## 2018-04-23 DIAGNOSIS — I428 Other cardiomyopathies: Secondary | ICD-10-CM

## 2018-04-23 DIAGNOSIS — I10 Essential (primary) hypertension: Secondary | ICD-10-CM | POA: Diagnosis not present

## 2018-04-23 MED ORDER — HYDRALAZINE HCL 25 MG PO TABS: 25.0000 mg | ORAL_TABLET | Freq: Two times a day (BID) | ORAL | 6 refills | Status: DC

## 2018-04-23 NOTE — Patient Instructions (Addendum)
Your physician wants you to follow-up in:6 months with Dr.McDowell You will receive a reminder letter in the mail two months in advance. If you don't receive a letter, please call our office to schedule the follow-up appointment.      START Hyralazine 25 mg twice a day for blood pressure.    INCREASE Lasix to 2 tablets (80 mg) for the next 3 days, THEN go back to regular dose of 40 mg daily      No lab work ort tests ordered today    If you need a refill on your cardiac medications before your next appointment, please call your pharmacy.      Thank you for choosing [facility] !

## 2018-04-24 ENCOUNTER — Inpatient Hospital Stay (HOSPITAL_COMMUNITY): Payer: Medicare Other

## 2018-04-24 ENCOUNTER — Other Ambulatory Visit: Payer: Self-pay

## 2018-04-24 ENCOUNTER — Inpatient Hospital Stay (HOSPITAL_COMMUNITY)
Admission: EM | Admit: 2018-04-24 | Discharge: 2018-04-27 | DRG: 193 | Disposition: A | Discharge status: Home / Self Care | Payer: Medicare Other | Attending: Internal Medicine | Admitting: Internal Medicine

## 2018-04-24 ENCOUNTER — Emergency Department (HOSPITAL_COMMUNITY): Payer: Medicare Other

## 2018-04-24 ENCOUNTER — Encounter (HOSPITAL_COMMUNITY): Payer: Self-pay

## 2018-04-24 DIAGNOSIS — J189 Pneumonia, unspecified organism: Secondary | ICD-10-CM | POA: Diagnosis present

## 2018-04-24 DIAGNOSIS — E876 Hypokalemia: Secondary | ICD-10-CM | POA: Diagnosis present

## 2018-04-24 DIAGNOSIS — I5043 Acute on chronic combined systolic (congestive) and diastolic (congestive) heart failure: Secondary | ICD-10-CM | POA: Diagnosis present

## 2018-04-24 DIAGNOSIS — N179 Acute kidney failure, unspecified: Secondary | ICD-10-CM | POA: Diagnosis not present

## 2018-04-24 DIAGNOSIS — J181 Lobar pneumonia, unspecified organism: Secondary | ICD-10-CM | POA: Diagnosis not present

## 2018-04-24 DIAGNOSIS — Z91030 Bee allergy status: Secondary | ICD-10-CM | POA: Diagnosis not present

## 2018-04-24 DIAGNOSIS — I4891 Unspecified atrial fibrillation: Secondary | ICD-10-CM | POA: Diagnosis present

## 2018-04-24 DIAGNOSIS — M109 Gout, unspecified: Secondary | ICD-10-CM | POA: Diagnosis present

## 2018-04-24 DIAGNOSIS — I340 Nonrheumatic mitral (valve) insufficiency: Secondary | ICD-10-CM | POA: Diagnosis not present

## 2018-04-24 DIAGNOSIS — Z7952 Long term (current) use of systemic steroids: Secondary | ICD-10-CM | POA: Diagnosis not present

## 2018-04-24 DIAGNOSIS — N183 Chronic kidney disease, stage 3 (moderate): Secondary | ICD-10-CM | POA: Diagnosis present

## 2018-04-24 DIAGNOSIS — E1121 Type 2 diabetes mellitus with diabetic nephropathy: Secondary | ICD-10-CM | POA: Diagnosis present

## 2018-04-24 DIAGNOSIS — I10 Essential (primary) hypertension: Secondary | ICD-10-CM | POA: Diagnosis present

## 2018-04-24 DIAGNOSIS — R748 Abnormal levels of other serum enzymes: Secondary | ICD-10-CM | POA: Diagnosis not present

## 2018-04-24 DIAGNOSIS — R7989 Other specified abnormal findings of blood chemistry: Secondary | ICD-10-CM | POA: Diagnosis present

## 2018-04-24 DIAGNOSIS — E1122 Type 2 diabetes mellitus with diabetic chronic kidney disease: Secondary | ICD-10-CM | POA: Diagnosis present

## 2018-04-24 DIAGNOSIS — Z79899 Other long term (current) drug therapy: Secondary | ICD-10-CM | POA: Diagnosis not present

## 2018-04-24 DIAGNOSIS — Z8673 Personal history of transient ischemic attack (TIA), and cerebral infarction without residual deficits: Secondary | ICD-10-CM

## 2018-04-24 DIAGNOSIS — I5023 Acute on chronic systolic (congestive) heart failure: Secondary | ICD-10-CM | POA: Diagnosis not present

## 2018-04-24 DIAGNOSIS — E039 Hypothyroidism, unspecified: Secondary | ICD-10-CM | POA: Diagnosis present

## 2018-04-24 DIAGNOSIS — I130 Hypertensive heart and chronic kidney disease with heart failure and stage 1 through stage 4 chronic kidney disease, or unspecified chronic kidney disease: Secondary | ICD-10-CM | POA: Diagnosis present

## 2018-04-24 DIAGNOSIS — E118 Type 2 diabetes mellitus with unspecified complications: Secondary | ICD-10-CM | POA: Diagnosis not present

## 2018-04-24 DIAGNOSIS — E785 Hyperlipidemia, unspecified: Secondary | ICD-10-CM | POA: Diagnosis present

## 2018-04-24 DIAGNOSIS — I428 Other cardiomyopathies: Secondary | ICD-10-CM | POA: Diagnosis not present

## 2018-04-24 DIAGNOSIS — R778 Other specified abnormalities of plasma proteins: Secondary | ICD-10-CM | POA: Diagnosis present

## 2018-04-24 DIAGNOSIS — I2510 Atherosclerotic heart disease of native coronary artery without angina pectoris: Secondary | ICD-10-CM | POA: Diagnosis present

## 2018-04-24 DIAGNOSIS — E11649 Type 2 diabetes mellitus with hypoglycemia without coma: Secondary | ICD-10-CM | POA: Diagnosis present

## 2018-04-24 DIAGNOSIS — J069 Acute upper respiratory infection, unspecified: Secondary | ICD-10-CM | POA: Diagnosis present

## 2018-04-24 DIAGNOSIS — I429 Cardiomyopathy, unspecified: Secondary | ICD-10-CM | POA: Diagnosis present

## 2018-04-24 DIAGNOSIS — I480 Paroxysmal atrial fibrillation: Secondary | ICD-10-CM | POA: Diagnosis present

## 2018-04-24 LAB — CBC WITH DIFFERENTIAL/PLATELET
Basophils Absolute: 0 10*3/uL (ref 0.0–0.1)
Basophils Relative: 0 %
Eosinophils Absolute: 0.1 10*3/uL (ref 0.0–0.7)
Eosinophils Relative: 1 %
HCT: 40.9 % (ref 36.0–46.0)
Hemoglobin: 12.9 g/dL (ref 12.0–15.0)
Lymphocytes Relative: 20 %
Lymphs Abs: 1.7 10*3/uL (ref 0.7–4.0)
MCH: 27.4 pg (ref 26.0–34.0)
MCHC: 31.5 g/dL (ref 30.0–36.0)
MCV: 86.8 fL (ref 78.0–100.0)
Monocytes Absolute: 0.7 10*3/uL (ref 0.1–1.0)
Monocytes Relative: 9 %
Neutro Abs: 6 10*3/uL (ref 1.7–7.7)
Neutrophils Relative %: 70 %
Platelets: 228 10*3/uL (ref 150–400)
RBC: 4.71 MIL/uL (ref 3.87–5.11)
RDW: 14.1 % (ref 11.5–15.5)
WBC: 8.5 10*3/uL (ref 4.0–10.5)

## 2018-04-24 LAB — BASIC METABOLIC PANEL
Anion gap: 12 (ref 5–15)
BUN: 16 mg/dL (ref 6–20)
CO2: 24 mmol/L (ref 22–32)
Calcium: 9.6 mg/dL (ref 8.9–10.3)
Chloride: 106 mmol/L (ref 101–111)
Creatinine, Ser: 1.21 mg/dL — ABNORMAL HIGH (ref 0.44–1.00)
GFR calc Af Amer: 48 mL/min — ABNORMAL LOW (ref >60)
GFR calc non Af Amer: 41 mL/min — ABNORMAL LOW (ref >60)
Glucose, Bld: 118 mg/dL — ABNORMAL HIGH (ref 65–99)
Potassium: 3.4 mmol/L — ABNORMAL LOW (ref 3.5–5.1)
Sodium: 142 mmol/L (ref 135–145)

## 2018-04-24 LAB — CBG MONITORING, ED: Glucose-Capillary: 194 mg/dL — ABNORMAL HIGH (ref 65–99)

## 2018-04-24 LAB — TROPONIN I
Troponin I: 0.03 ng/mL (ref <0.03)
Troponin I: 0.04 ng/mL (ref <0.03)

## 2018-04-24 LAB — BRAIN NATRIURETIC PEPTIDE: B Natriuretic Peptide: 671 pg/mL — ABNORMAL HIGH (ref 0.0–100.0)

## 2018-04-24 LAB — TSH: TSH: 2.515 u[IU]/mL (ref 0.350–4.500)

## 2018-04-24 LAB — D-DIMER, QUANTITATIVE: D-Dimer, Quant: 1.13 ug/mL-FEU — ABNORMAL HIGH (ref 0.00–0.50)

## 2018-04-24 MED ORDER — POTASSIUM CHLORIDE CRYS ER 20 MEQ PO TBCR
20.0000 meq | EXTENDED_RELEASE_TABLET | Freq: Once | ORAL | Status: AC
  Administered 2018-04-25: 20 meq via ORAL
  Filled 2018-04-24: qty 1

## 2018-04-24 MED ORDER — HYDRALAZINE HCL 25 MG PO TABS
25.0000 mg | ORAL_TABLET | Freq: Two times a day (BID) | ORAL | Status: DC
  Administered 2018-04-25 (×2): 25 mg via ORAL
  Filled 2018-04-24 (×2): qty 1

## 2018-04-24 MED ORDER — ISOSORBIDE MONONITRATE ER 60 MG PO TB24
30.0000 mg | ORAL_TABLET | Freq: Every day | ORAL | Status: DC
  Administered 2018-04-25 – 2018-04-27 (×3): 30 mg via ORAL
  Filled 2018-04-24 (×3): qty 1

## 2018-04-24 MED ORDER — ALBUTEROL SULFATE (2.5 MG/3ML) 0.083% IN NEBU
5.0000 mg | INHALATION_SOLUTION | Freq: Once | RESPIRATORY_TRACT | Status: AC
  Administered 2018-04-24: 5 mg via RESPIRATORY_TRACT
  Filled 2018-04-24: qty 6

## 2018-04-24 MED ORDER — CARVEDILOL 3.125 MG PO TABS
3.1250 mg | ORAL_TABLET | Freq: Two times a day (BID) | ORAL | Status: DC
  Administered 2018-04-25: 3.125 mg via ORAL
  Filled 2018-04-24: qty 1

## 2018-04-24 MED ORDER — SODIUM CHLORIDE 0.9 % IV SOLN
1.0000 g | INTRAVENOUS | Status: DC
  Administered 2018-04-25 (×2): 1 g via INTRAVENOUS
  Filled 2018-04-24: qty 1
  Filled 2018-04-24 (×2): qty 10

## 2018-04-24 MED ORDER — DONEPEZIL HCL 5 MG PO TABS
10.0000 mg | ORAL_TABLET | Freq: Every day | ORAL | Status: DC
  Administered 2018-04-25 – 2018-04-27 (×3): 10 mg via ORAL
  Filled 2018-04-24 (×3): qty 2

## 2018-04-24 MED ORDER — SODIUM CHLORIDE 0.9% FLUSH
3.0000 mL | Freq: Two times a day (BID) | INTRAVENOUS | Status: DC
  Administered 2018-04-25 – 2018-04-27 (×6): 3 mL via INTRAVENOUS

## 2018-04-24 MED ORDER — SODIUM CHLORIDE 0.9% FLUSH: 3.0000 mL | INTRAVENOUS | Status: DC | PRN

## 2018-04-24 MED ORDER — SODIUM CHLORIDE 0.9 % IV SOLN
250.0000 mL | INTRAVENOUS | Status: DC | PRN
  Administered 2018-04-25: 250 mL via INTRAVENOUS

## 2018-04-24 MED ORDER — INSULIN ASPART 100 UNIT/ML ~~LOC~~ SOLN: 0.0000 [IU] | Freq: Every day | SUBCUTANEOUS | Status: DC

## 2018-04-24 MED ORDER — ACETAMINOPHEN 650 MG RE SUPP: 650.0000 mg | Freq: Four times a day (QID) | RECTAL | Status: DC | PRN

## 2018-04-24 MED ORDER — LINAGLIPTIN 5 MG PO TABS
5.0000 mg | ORAL_TABLET | Freq: Every day | ORAL | Status: DC
  Administered 2018-04-25 – 2018-04-27 (×3): 5 mg via ORAL
  Filled 2018-04-24 (×3): qty 1

## 2018-04-24 MED ORDER — SODIUM CHLORIDE 0.9 % IV SOLN
500.0000 mg | INTRAVENOUS | Status: DC
  Administered 2018-04-25 (×2): 500 mg via INTRAVENOUS
  Filled 2018-04-24 (×4): qty 500

## 2018-04-24 MED ORDER — IPRATROPIUM-ALBUTEROL 0.5-2.5 (3) MG/3ML IN SOLN
3.0000 mL | Freq: Once | RESPIRATORY_TRACT | Status: AC
  Administered 2018-04-24: 3 mL via RESPIRATORY_TRACT
  Filled 2018-04-24: qty 3

## 2018-04-24 MED ORDER — FUROSEMIDE 20 MG PO TABS
20.0000 mg | ORAL_TABLET | Freq: Every day | ORAL | Status: DC
  Administered 2018-04-25: 20 mg via ORAL
  Filled 2018-04-24: qty 1

## 2018-04-24 MED ORDER — METHYLPREDNISOLONE SODIUM SUCC 125 MG IJ SOLR
80.0000 mg | Freq: Once | INTRAMUSCULAR | Status: AC
  Administered 2018-04-24: 80 mg via INTRAVENOUS
  Filled 2018-04-24: qty 2

## 2018-04-24 MED ORDER — INSULIN ASPART 100 UNIT/ML ~~LOC~~ SOLN
0.0000 [IU] | Freq: Three times a day (TID) | SUBCUTANEOUS | Status: DC
  Administered 2018-04-25: 1 [IU] via SUBCUTANEOUS
  Administered 2018-04-25: 2 [IU] via SUBCUTANEOUS
  Administered 2018-04-25 – 2018-04-26 (×2): 1 [IU] via SUBCUTANEOUS
  Administered 2018-04-26 – 2018-04-27 (×2): 2 [IU] via SUBCUTANEOUS
  Administered 2018-04-27: 1 [IU] via SUBCUTANEOUS

## 2018-04-24 MED ORDER — ACETAMINOPHEN 325 MG PO TABS: 650.0000 mg | ORAL_TABLET | Freq: Four times a day (QID) | ORAL | Status: DC | PRN

## 2018-04-24 MED ORDER — DIPHENHYDRAMINE HCL 25 MG PO CAPS: 25.0000 mg | ORAL_CAPSULE | ORAL | Status: DC | PRN

## 2018-04-24 MED ORDER — ASPIRIN EC 81 MG PO TBEC
81.0000 mg | DELAYED_RELEASE_TABLET | Freq: Every day | ORAL | Status: DC
  Administered 2018-04-25: 81 mg via ORAL
  Filled 2018-04-24: qty 1

## 2018-04-24 MED ORDER — ALLOPURINOL 100 MG PO TABS
100.0000 mg | ORAL_TABLET | Freq: Every day | ORAL | Status: DC
  Administered 2018-04-25 – 2018-04-27 (×3): 100 mg via ORAL
  Filled 2018-04-24 (×3): qty 1

## 2018-04-24 MED ORDER — OXYCODONE HCL 5 MG PO TABS
5.0000 mg | ORAL_TABLET | Freq: Four times a day (QID) | ORAL | Status: DC | PRN
  Filled 2018-04-24: qty 1

## 2018-04-24 MED ORDER — FAMOTIDINE 20 MG PO TABS
20.0000 mg | ORAL_TABLET | Freq: Every day | ORAL | Status: DC
  Administered 2018-04-25 – 2018-04-27 (×3): 20 mg via ORAL
  Filled 2018-04-24 (×3): qty 1

## 2018-04-24 MED ORDER — ENOXAPARIN SODIUM 100 MG/ML ~~LOC~~ SOLN
1.0000 mg/kg | Freq: Once | SUBCUTANEOUS | Status: AC
  Administered 2018-04-25: 90 mg via SUBCUTANEOUS
  Filled 2018-04-24: qty 1

## 2018-04-24 NOTE — ED Triage Notes (Signed)
Pt c/o cough and sob since this morning.  Reports cough is nonproductive.  Denies pain.  EMS says pt was hypertensive upon their arrival.  174/127 and pt has not had her bp medication today.

## 2018-04-24 NOTE — H&P (Addendum)
TRH H&P   Patient Demographics:    [name], is a 82 y.o. female  MRN: [id number]   DOB - 21-Oct-1936  Admit Date - 04/24/2018  Outpatient Primary MD for the patient is Jani Gravel, MD  Referring MD/NP/PA: Nat Christen  Outpatient Specialists:  Rozann Lesches  Patient coming from: home  Chief Complaint  Patient presents with  . Shortness of Breath      HPI:    [name]  is a 82 y.o. female, w hx of hypothyroidism,  dm2, hypertension, hyperlipidemia, renal artery stenosis, stroke, CAD, CHF (EF 20-25%), asthma, apparently presents with cough with green sputum and dyspnea for the past 2 days.  Pt denies fever, chills, cp, palp, orthopnea, pnd, lower ext edema.    In ED, pt was found to be in Afib with RVR per ED. This resolved without intervention.  Pt given solumedrol iv, as well as breathing treatment in ED, unclear if Afib with RVR was before or after breathing treatment. Pox 88% on RA   CXR IMPRESSION: Chronic cardiomegaly and vascular congestion.  No acute disease.  Atherosclerosis.  Wbc 8.5, Hgb 12.9, Plt 228 Na 142, K 3.4, Bun 16, Creatinine 1.21 Trop 0.03  Pt will be admitted for afib with RVR, and troponin elevation, and hypoxia.      Review of systems:    In addition to the HPI above, No Fever-chills, No Headache, No changes with Vision or hearing, No problems swallowing food or Liquids, No Chest pain, No Abdominal pain, No Nausea or Vommitting, Bowel movements are regular, No Blood in stool or Urine, No dysuria, No new skin rashes or bruises, No new joints pains-aches,  No new weakness, tingling, numbness in any extremity, No recent weight gain or loss, No polyuria, polydypsia or polyphagia, No significant Mental Stressors.  A full 10 point Review of Systems was done, except as stated above, all other Review of Systems were  negative.   With Past History of the following :    Past Medical History:  Diagnosis Date  . Asthma   . Bronchitis   . Carotid artery disease ([place])   . Coronary atherosclerosis of native coronary artery    Nonobstructive at cath 2005  . DM2 (diabetes mellitus, type 2) ([place])   . Essential hypertension, benign   . Gout   . History of stroke   . Hypothyroidism   . Influenza 2013  . Nonischemic cardiomyopathy (HCC)    LVEF 20-25%  . Renal artery stenosis (HCC)    Bilateral  . Stroke [facility])       Past Surgical History:  Procedure Laterality Date  . ABDOMINAL HYSTERECTOMY        Social History:     Social History   Tobacco Use  . Smoking status: Never Smoker  . Smokeless tobacco: Never Used  Substance Use Topics  . Alcohol use: No  Alcohol/week: 0.0 oz     Lives - at home  Mobility - walks by self   Family History :     Family History  Problem Relation Age of Onset  . Asthma Son        Home Medications:   Prior to Admission medications   Medication Sig Start Date End Date Taking? Authorizing Provider  allopurinol (ZYLOPRIM) 100 MG tablet TAKE ONE TABLET BY MOUTH ONCE DAILY FOR GOUT 07/01/17   [provider]  carvedilol (COREG) 6.25 MG tablet Take 1 tablet (6.25 mg total) by mouth 2 (two) times daily with a meal. Patient taking differently: Take 6.25 mg by mouth daily.  07/06/15   Marjean Donna, MD  colchicine 0.6 MG tablet TAKE ONE TABLET BY MOUTH ONCE DAILY FOR GOUT 08/29/17   [provider]  diphenhydrAMINE (BENADRYL) 25 mg capsule Take 1 capsule (25 mg total) by mouth every 4 (four) hours as needed for itching (or hives). 09/26/17   Forde Dandy, MD  donepezil (ARICEPT) 10 MG tablet Take 10 mg by mouth daily.    [provider]  EPINEPHrine 0.3 mg/0.3 mL IJ SOAJ injection Inject 0.3 mLs (0.3 mg total) into the muscle once as needed (severe allergic reaction). 09/26/17   Forde Dandy, MD  famotidine (PEPCID) 20 MG tablet  Take 1 tablet (20 mg total) by mouth daily. 09/26/17   Forde Dandy, MD  furosemide (LASIX) 40 MG tablet Take Lasix 40 mg daily. MAY take additional 40 that day if she gains 2-3 lbs in a 24 hr period Patient taking differently: Take 20 mg by mouth daily.  11/13/15   Satira Sark, MD  gabapentin (NEURONTIN) 100 MG capsule Take 100 mg by mouth daily.    [provider]  hydrALAZINE (APRESOLINE) 25 MG tablet Take 1 tablet (25 mg total) by mouth 2 (two) times daily. 04/23/18 07/22/18  Satira Sark, MD  isosorbide dinitrate (ISORDIL) 30 MG tablet Take 30 mg by mouth daily. 08/30/17   [provider]  oxyCODONE (ROXICODONE) 5 MG immediate release tablet Take 1 tablet (5 mg total) by mouth every 6 (six) hours as needed for severe pain. 08/18/17   Mesner, Corene Cornea, MD  pioglitazone (ACTOS) 15 MG tablet TAKE ONE TABLET BY MOUTH ONCE DAILY FOR DIABETES 08/12/17   [provider]  predniSONE (DELTASONE) 10 MG tablet Take 4 tablets (40 mg total) by mouth daily. 09/26/17   Forde Dandy, MD  predniSONE (DELTASONE) 20 MG tablet 3 tabs po daily x 3 days, then 2 tabs x 3 days, then 1.5 tabs x 3 days, then 1 tab x 3 days, then 0.5 tabs x 3 days 08/18/17   Mesner, Corene Cornea, MD  sitaGLIPtin (JANUVIA) 50 MG tablet Take 50 mg by mouth daily.    [provider]     Allergies:     Allergies  Allergen Reactions  . Yellow Jacket Venom [Bee Venom] Shortness Of Breath and Itching     Physical Exam:   Vitals  Blood pressure (!) 162/85, pulse 94, temperature 99.5 F (37.5 C), temperature source Oral, resp. rate (!) 32, weight 88.9 kg (196 lb), SpO2 97 %.   1. General  lying in bed in NAD  2. Normal affect and insight, Not Suicidal or Homicidal, Awake Alert, Oriented X 3.  3. No F.N deficits, ALL C.Nerves Intact, Strength 5/5 all 4 extremities, Sensation intact all 4 extremities, Plantars down going.  4. Ears and Eyes appear Normal, Conjunctivae  clear, PERRLA. Moist Oral  Mucosa.  5. Supple Neck, No JVD, No cervical lymphadenopathy appriciated, No Carotid Bruits.  6. Symmetrical Chest wall movement, slight crackles left lung base, no wheezing.   7. RRR, No Gallops, Rubs or Murmurs, No Parasternal Heave.  8. Positive Bowel Sounds, Abdomen Soft, No tenderness, No organomegaly appriciated,No rebound -guarding or rigidity.  9.  No Cyanosis, Normal Skin Turgor, No Skin Rash or Bruise.  10. Good muscle tone,  joints appear normal , no effusions, Normal ROM.  11. No Palpable Lymph Nodes in Neck or Axillae     Data Review:    CBC Recent Labs  Lab 04/24/18 1642  WBC 8.5  HGB 12.9  HCT 40.9  PLT 228  MCV 86.8  MCH 27.4  MCHC 31.5  RDW 14.1  LYMPHSABS 1.7  MONOABS 0.7  EOSABS 0.1  BASOSABS 0.0   ------------------------------------------------------------------------------------------------------------------  Chemistries  Recent Labs  Lab 04/24/18 1642  NA 142  K 3.4*  CL 106  CO2 24  GLUCOSE 118*  BUN 16  CREATININE 1.21*  CALCIUM 9.6   ------------------------------------------------------------------------------------------------------------------ estimated creatinine clearance is 39.9 mL/min (A) (by C-G formula based on SCr of 1.21 mg/dL (H)). ------------------------------------------------------------------------------------------------------------------ No results for input(s): TSH, T4TOTAL, T3FREE, THYROIDAB in the last 72 hours.  Invalid input(s): FREET3  Coagulation profile No results for input(s): INR, PROTIME in the last 168 hours. ------------------------------------------------------------------------------------------------------------------- No results for input(s): DDIMER in the last 72 hours. -------------------------------------------------------------------------------------------------------------------  Cardiac Enzymes Recent Labs  Lab 04/24/18 1642  TROPONINI 0.03*    ------------------------------------------------------------------------------------------------------------------    Component Value Date/Time   BNP 3,582.0 (H) 10/01/2015 1653   BNP 30.4 03/30/2010     ---------------------------------------------------------------------------------------------------------------  Urinalysis    Component Value Date/Time   COLORURINE YELLOW 07/09/2014 [place] 07/09/2014 0553   LABSPEC 1.015 07/09/2014 0553   PHURINE 5.5 07/09/2014 0553   GLUCOSEU NEGATIVE 07/09/2014 0553   HGBUR NEGATIVE 07/09/2014 0553   BILIRUBINUR NEGATIVE 07/09/2014 0553   KETONESUR TRACE (A) 07/09/2014 0553   PROTEINUR TRACE (A) 07/09/2014 0553   UROBILINOGEN 0.2 07/09/2014 0553   NITRITE POSITIVE (A) 07/09/2014 0553   LEUKOCYTESUR NEGATIVE 07/09/2014 0553    ----------------------------------------------------------------------------------------------------------------   Imaging Results:    Dg Chest 2 View  Result Date: 04/24/2018 CLINICAL DATA:  Dry cough and shortness of breath today. EXAM: CHEST - 2 VIEW COMPARISON:  PA and lateral chest 10/01/2015 and 11/10/2014. FINDINGS: There is cardiomegaly and mild vascular congestion, unchanged since the most recent exam. Aortic atherosclerosis is noted. No pneumothorax or pleural fluid. No consolidative process. No acute bony abnormality. IMPRESSION: Chronic cardiomegaly and vascular congestion. No acute disease. Atherosclerosis. Electronically Signed   By: Inge Rise M.D.   On: 04/24/2018 17:31     Ekg afib at 124, nl axis, RBBB, st depression v4-6   Assessment & Plan:    Principal Problem:   Atrial fibrillation with RVR ([place]) Active Problems:   Essential hypertension   DM2 (diabetes mellitus, type 2) (HCC)   Elevated troponin I level   Hypokalemia    Afib with RVR Tele Trop I q6h x3 Check tsh Check cardiac echo Cardiology consult ordered in computer  Troponin elevation ? Demand  ischemia  Hypoxia ? Asthma vs copd vs occult pneumonia Check bnp Check CT chest Start rocephin and zithromax iv  H/o CHF Cont carvedilo 6.25mg  po qday=> 3.125mg  po bid Cont lasix 20mg  po qday Consider entresto  CAD Cont imdur Start aspirin 325mg  po qday  Hypokalemia Replete Check  cmp in am  Dm2 STOP ACTOS due to hx of CHF Cont januvia fsbs ac and qhs, ISS  Gout Cont allopurinol  Dementia Cont aricept    DVT Prophylaxis  Lovenox - SCDs  AM Labs Ordered, also please review Full Orders  Family Communication: Admission, patients condition and plan of care including tests being ordered have been discussed with the patient  who indicate understanding and agree with the plan and Code Status.  Code Status FULL CODE  Likely DC to  home  Condition GUARDED    Consults called: cardiology consult placed in computer  Admission status: inpatient  Time spent in minutes : 45   Jani Gravel M.D on 04/24/2018 at 8:32 PM  Between 7am to 7pm - Pager - [phone]   After 7pm go to [email or web address] - password [facility]  [facility] - Office  [phone]

## 2018-04-24 NOTE — ED Provider Notes (Signed)
[facility] EMERGENCY DEPARTMENT Provider Note   CSN: [id number] Arrival date & time: 04/24/18  1604     History   Chief Complaint Chief Complaint  Patient presents with  . Shortness of Breath    HPI [name] is a 82 y.o. female.  Level 5 caveat for urgent need for intervention.  Patient presents with persistent productive cough for 1 week.  Initial pulse ox reading was 84%.  She does not require supplemental oxygen at home.  She lives independently.  She is able to walk but very dyspneic.  No prior history of atrial fibrillation.  Review of systems negative for substernal chest pain.     Past Medical History:  Diagnosis Date  . Asthma   . Bronchitis   . Carotid artery disease ([place])   . Coronary atherosclerosis of native coronary artery    Nonobstructive at cath 2005  . DM2 (diabetes mellitus, type 2) ([place])   . Essential hypertension, benign   . Gout   . History of stroke   . Hypothyroidism   . Influenza 2013  . Nonischemic cardiomyopathy (HCC)    LVEF 20-25%  . Renal artery stenosis (HCC)    Bilateral  . Stroke [facility])     Patient Active Problem List   Diagnosis Date Noted  . Atrial fibrillation with RVR ([place]) 04/24/2018  . Acute exacerbation of CHF (congestive heart failure) ([place]) 09/01/2015  . CHF (congestive heart failure) ([place]) 07/02/2015  . Elevated troponin I level 07/02/2015  . Right bundle branch block 07/02/2015  . Renal insufficiency 07/02/2015  . CAP (community acquired pneumonia) 04/25/2015  . Acute respiratory failure with hypoxia ([place]) 04/25/2015  . Acute renal injury ([place]) 04/25/2015  . DM2 (diabetes mellitus, type 2) ([place]) 04/25/2015  . ARF (acute renal failure) ([place]) 06/18/2014  . Mitral regurgitation 06/09/2014  . Dyspnea 06/06/2014  . Asthma exacerbation 06/06/2014  . Acute on chronic combined systolic and diastolic CHF (congestive heart failure) ([place]) 06/06/2014  . Influenza 12/10/2012  . HYPERLIPIDEMIA 07/08/2010  . Essential  hypertension 04/06/2010  . CORONARY ATHEROSCLEROSIS NATIVE CORONARY ARTERY 04/06/2010  . CARDIOMYOPATHY 03/29/2010    Past Surgical History:  Procedure Laterality Date  . ABDOMINAL HYSTERECTOMY       OB History    Gravida  15   Para  15   Term  15   Preterm      AB      Living        SAB      TAB      Ectopic      Multiple      Live Births               Home Medications    Prior to Admission medications   Medication Sig Start Date End Date Taking? Authorizing Provider  allopurinol (ZYLOPRIM) 100 MG tablet TAKE ONE TABLET BY MOUTH ONCE DAILY FOR GOUT 07/01/17   [provider]  carvedilol (COREG) 6.25 MG tablet Take 1 tablet (6.25 mg total) by mouth 2 (two) times daily with a meal. Patient taking differently: Take 6.25 mg by mouth daily.  07/06/15   Marjean Donna, MD  colchicine 0.6 MG tablet TAKE ONE TABLET BY MOUTH ONCE DAILY FOR GOUT 08/29/17   [provider]  diphenhydrAMINE (BENADRYL) 25 mg capsule Take 1 capsule (25 mg total) by mouth every 4 (four) hours as needed for itching (or hives). 09/26/17   Forde Dandy, MD  donepezil (ARICEPT) 10 MG tablet Take  10 mg by mouth daily.    [provider]  EPINEPHrine 0.3 mg/0.3 mL IJ SOAJ injection Inject 0.3 mLs (0.3 mg total) into the muscle once as needed (severe allergic reaction). 09/26/17   Forde Dandy, MD  famotidine (PEPCID) 20 MG tablet Take 1 tablet (20 mg total) by mouth daily. 09/26/17   Forde Dandy, MD  furosemide (LASIX) 40 MG tablet Take Lasix 40 mg daily. MAY take additional 40 that day if she gains 2-3 lbs in a 24 hr period Patient taking differently: Take 20 mg by mouth daily.  11/13/15   Satira Sark, MD  gabapentin (NEURONTIN) 100 MG capsule Take 100 mg by mouth daily.    [provider]  hydrALAZINE (APRESOLINE) 25 MG tablet Take 1 tablet (25 mg total) by mouth 2 (two) times daily. 04/23/18 07/22/18  Satira Sark, MD  isosorbide dinitrate  (ISORDIL) 30 MG tablet Take 30 mg by mouth daily. 08/30/17   [provider]  oxyCODONE (ROXICODONE) 5 MG immediate release tablet Take 1 tablet (5 mg total) by mouth every 6 (six) hours as needed for severe pain. 08/18/17   Mesner, Corene Cornea, MD  pioglitazone (ACTOS) 15 MG tablet TAKE ONE TABLET BY MOUTH ONCE DAILY FOR DIABETES 08/12/17   [provider]  predniSONE (DELTASONE) 10 MG tablet Take 4 tablets (40 mg total) by mouth daily. 09/26/17   Forde Dandy, MD  predniSONE (DELTASONE) 20 MG tablet 3 tabs po daily x 3 days, then 2 tabs x 3 days, then 1.5 tabs x 3 days, then 1 tab x 3 days, then 0.5 tabs x 3 days 08/18/17   Mesner, Corene Cornea, MD  sitaGLIPtin (JANUVIA) 50 MG tablet Take 50 mg by mouth daily.    [provider]    Family History Family History  Problem Relation Age of Onset  . Asthma Son     Social History Social History   Tobacco Use  . Smoking status: Never Smoker  . Smokeless tobacco: Never Used  Substance Use Topics  . Alcohol use: No    Alcohol/week: 0.0 oz  . Drug use: No     Allergies   Yellow jacket venom [bee venom]   Review of Systems Review of Systems  Unable to perform ROS: Acuity of condition     Physical Exam Updated Vital Signs BP (!) 158/84 (BP Location: Left Arm)   Pulse (!) 105   Temp 99.5 F (37.5 C) (Oral)   Resp (!) 30   Wt 88.9 kg (196 lb)   SpO2 (!) 89%   BMI 34.72 kg/m   Physical Exam  Constitutional: She is oriented to person, place, and time.  Dyspneic, tachypneic  HENT:  Head: Normocephalic and atraumatic.  Eyes: Conjunctivae are normal.  Neck: Neck supple.  Cardiovascular:  Tachycardic, irregularly irregular  Pulmonary/Chest: Breath sounds normal.  Scattered rhonchi/wheezing  Abdominal: Soft. Bowel sounds are normal.  Musculoskeletal: Normal range of motion.  Neurological: She is alert and oriented to person, place, and time.  Skin: Skin is warm and dry.  Psychiatric: She has a normal mood and  affect. Her behavior is normal.  Nursing note and vitals reviewed.    ED Treatments / Results  Labs (all labs ordered are listed, but only abnormal results are displayed) Labs Reviewed  BASIC METABOLIC PANEL - Abnormal; Notable for the following components:      Result Value   Potassium 3.4 (*)    Glucose, Bld 118 (*)    Creatinine,  Ser 1.21 (*)    GFR calc non Af Amer 41 (*)    GFR calc Af Amer 48 (*)    All other components within normal limits  TROPONIN I - Abnormal; Notable for the following components:   Troponin I 0.03 (*)    All other components within normal limits  CBC WITH DIFFERENTIAL/PLATELET    EKG EKG Interpretation  Date/Time:  Tuesday Apr 24 2018 16:11:58 EDT Ventricular Rate:  124 PR Interval:    QRS Duration: 142 QT Interval:  367 QTC Calculation: 528 R Axis:   82 Text Interpretation:  Atrial fibrillation Right bundle branch block Confirmed by Nat Christen [phone]) on 04/24/2018 4:24:16 PM   Radiology Dg Chest 2 View  Result Date: 04/24/2018 CLINICAL DATA:  Dry cough and shortness of breath today. EXAM: CHEST - 2 VIEW COMPARISON:  PA and lateral chest 10/01/2015 and 11/10/2014. FINDINGS: There is cardiomegaly and mild vascular congestion, unchanged since the most recent exam. Aortic atherosclerosis is noted. No pneumothorax or pleural fluid. No consolidative process. No acute bony abnormality. IMPRESSION: Chronic cardiomegaly and vascular congestion. No acute disease. Atherosclerosis. Electronically Signed   By: Inge Rise M.D.   On: 04/24/2018 17:31    Procedures Procedures (including critical care time)  Medications Ordered in ED Medications  albuterol (PROVENTIL) (2.5 MG/3ML) 0.083% nebulizer solution 5 mg (5 mg Nebulization Given 04/24/18 1628)  methylPREDNISolone sodium succinate (SOLU-MEDROL) 125 mg/2 mL injection 80 mg (80 mg Intravenous Given 04/24/18 1719)  ipratropium-albuterol (DUONEB) 0.5-2.5 (3) MG/3ML nebulizer solution 3 mL (3 mLs  Nebulization Given 04/24/18 1730)     Initial Impression / Assessment and Plan / ED Course  I have reviewed the triage vital signs and the nursing notes.  Pertinent labs & imaging results that were available during my care of the patient were reviewed by me and considered in my medical decision making (see chart for details).     Patient presents with URI like history.  Additionally, EKG shows atrial fibrillation with RVR.  She feels better after nebulizer treatments, IV steroids.  Her pulse has decreased to around 90-100/min.  She has intermittent been in normal sinus rhythm.  Discussed with Dr. Maudie Mercury.  Admit.   Final Clinical Impressions(s) / ED Diagnoses   Final diagnoses:  Atrial fibrillation with RVR ([place])  Upper respiratory tract infection, unspecified type    ED Discharge Orders    None       Nat Christen, MD 04/24/18 1911

## 2018-04-24 NOTE — ED Notes (Signed)
Date and time results received: 04/24/18 5:41 PM   Test: Troponin Critical Value: 0.03  Name of Provider Notified: Lacinda Axon Orders Received? Or Actions Taken?: no new orders at this time.

## 2018-04-24 NOTE — ED Notes (Signed)
Upon entering room pt's O2 sat is 84% on RA with no distress. Placed pt on 3L [place] with O2 sat improving to 93%

## 2018-04-25 ENCOUNTER — Inpatient Hospital Stay (HOSPITAL_COMMUNITY): Payer: Medicare Other

## 2018-04-25 DIAGNOSIS — J181 Lobar pneumonia, unspecified organism: Secondary | ICD-10-CM

## 2018-04-25 DIAGNOSIS — I340 Nonrheumatic mitral (valve) insufficiency: Secondary | ICD-10-CM

## 2018-04-25 DIAGNOSIS — I10 Essential (primary) hypertension: Secondary | ICD-10-CM

## 2018-04-25 DIAGNOSIS — N183 Chronic kidney disease, stage 3 (moderate): Secondary | ICD-10-CM

## 2018-04-25 DIAGNOSIS — I5023 Acute on chronic systolic (congestive) heart failure: Secondary | ICD-10-CM

## 2018-04-25 DIAGNOSIS — R748 Abnormal levels of other serum enzymes: Secondary | ICD-10-CM

## 2018-04-25 DIAGNOSIS — E1121 Type 2 diabetes mellitus with diabetic nephropathy: Secondary | ICD-10-CM

## 2018-04-25 DIAGNOSIS — N179 Acute kidney failure, unspecified: Secondary | ICD-10-CM

## 2018-04-25 DIAGNOSIS — I480 Paroxysmal atrial fibrillation: Secondary | ICD-10-CM

## 2018-04-25 LAB — CBC
HCT: 40.2 % (ref 36.0–46.0)
Hemoglobin: 12.4 g/dL (ref 12.0–15.0)
MCH: 26.8 pg (ref 26.0–34.0)
MCHC: 30.8 g/dL (ref 30.0–36.0)
MCV: 86.8 fL (ref 78.0–100.0)
Platelets: 222 10*3/uL (ref 150–400)
RBC: 4.63 MIL/uL (ref 3.87–5.11)
RDW: 14.3 % (ref 11.5–15.5)
WBC: 9.9 10*3/uL (ref 4.0–10.5)

## 2018-04-25 LAB — GLUCOSE, CAPILLARY
Glucose-Capillary: 184 mg/dL — ABNORMAL HIGH (ref 65–99)
Glucose-Capillary: 145 mg/dL — ABNORMAL HIGH (ref 65–99)
Glucose-Capillary: 144 mg/dL — ABNORMAL HIGH (ref 65–99)
Glucose-Capillary: 153 mg/dL — ABNORMAL HIGH (ref 65–99)

## 2018-04-25 LAB — COMPREHENSIVE METABOLIC PANEL
ALT: 11 U/L — ABNORMAL LOW (ref 14–54)
AST: 22 U/L (ref 15–41)
Albumin: 3.4 g/dL — ABNORMAL LOW (ref 3.5–5.0)
Alkaline Phosphatase: 119 U/L (ref 38–126)
Anion gap: 12 (ref 5–15)
BUN: 18 mg/dL (ref 6–20)
CO2: 24 mmol/L (ref 22–32)
Calcium: 9.3 mg/dL (ref 8.9–10.3)
Chloride: 105 mmol/L (ref 101–111)
Creatinine, Ser: 1.41 mg/dL — ABNORMAL HIGH (ref 0.44–1.00)
GFR calc Af Amer: 40 mL/min — ABNORMAL LOW (ref >60)
GFR calc non Af Amer: 34 mL/min — ABNORMAL LOW (ref >60)
Glucose, Bld: 322 mg/dL — ABNORMAL HIGH (ref 65–99)
Potassium: 3.9 mmol/L (ref 3.5–5.1)
Sodium: 141 mmol/L (ref 135–145)
Total Bilirubin: 0.7 mg/dL (ref 0.3–1.2)
Total Protein: 7.5 g/dL (ref 6.5–8.1)

## 2018-04-25 LAB — CK TOTAL AND CKMB (NOT AT ARMC)
CK, MB: 5.3 ng/mL — ABNORMAL HIGH (ref 0.5–5.0)
Relative Index: 4.1 — ABNORMAL HIGH (ref 0.0–2.5)
Total CK: 129 U/L (ref 38–234)

## 2018-04-25 LAB — MRSA PCR SCREENING: MRSA by PCR: NEGATIVE

## 2018-04-25 LAB — LIPID PANEL
Cholesterol: 183 mg/dL (ref 0–200)
HDL: 52 mg/dL (ref >40)
LDL Cholesterol: 117 mg/dL — ABNORMAL HIGH (ref 0–99)
Total CHOL/HDL Ratio: 3.5 RATIO
Triglycerides: 69 mg/dL (ref <150)
VLDL: 14 mg/dL (ref 0–40)

## 2018-04-25 LAB — ECHOCARDIOGRAM COMPLETE
Height: 63 in
Weight: 3107.6 oz

## 2018-04-25 LAB — HEMOGLOBIN A1C
Hgb A1c MFr Bld: 6.6 % — ABNORMAL HIGH (ref 4.8–5.6)
Mean Plasma Glucose: 142.72 mg/dL

## 2018-04-25 LAB — URIC ACID: Uric Acid, Serum: 6.4 mg/dL (ref 2.3–6.6)

## 2018-04-25 LAB — TROPONIN I
Troponin I: 0.03 ng/mL (ref <0.03)
Troponin I: 0.03 ng/mL (ref <0.03)

## 2018-04-25 MED ORDER — ORAL CARE MOUTH RINSE
15.0000 mL | Freq: Two times a day (BID) | OROMUCOSAL | Status: DC
  Administered 2018-04-25 – 2018-04-27 (×3): 15 mL via OROMUCOSAL

## 2018-04-25 MED ORDER — TECHNETIUM TO 99M ALBUMIN AGGREGATED
4.3900 | Freq: Once | INTRAVENOUS | Status: AC | PRN
  Administered 2018-04-25: 4.39 via INTRAVENOUS

## 2018-04-25 MED ORDER — FUROSEMIDE 40 MG PO TABS: 40.0000 mg | ORAL_TABLET | Freq: Every day | ORAL | Status: DC

## 2018-04-25 MED ORDER — AMOXICILLIN-POT CLAVULANATE 875-125 MG PO TABS
1.0000 | ORAL_TABLET | Freq: Two times a day (BID) | ORAL | Status: DC
  Administered 2018-04-26 – 2018-04-27 (×3): 1 via ORAL
  Filled 2018-04-25 (×4): qty 1

## 2018-04-25 MED ORDER — CARVEDILOL 3.125 MG PO TABS
6.2500 mg | ORAL_TABLET | Freq: Two times a day (BID) | ORAL | Status: DC
  Administered 2018-04-25 – 2018-04-27 (×4): 6.25 mg via ORAL
  Filled 2018-04-25 (×4): qty 2

## 2018-04-25 MED ORDER — ATORVASTATIN CALCIUM 20 MG PO TABS
20.0000 mg | ORAL_TABLET | Freq: Every day | ORAL | Status: DC
  Administered 2018-04-26: 20 mg via ORAL
  Filled 2018-04-25: qty 1

## 2018-04-25 MED ORDER — FUROSEMIDE 10 MG/ML IJ SOLN
40.0000 mg | Freq: Once | INTRAMUSCULAR | Status: AC
  Administered 2018-04-25: 40 mg via INTRAVENOUS
  Filled 2018-04-25: qty 4

## 2018-04-25 MED ORDER — GUAIFENESIN-DM 100-10 MG/5ML PO SYRP
5.0000 mL | ORAL_SOLUTION | ORAL | Status: DC | PRN
  Administered 2018-04-25 – 2018-04-26 (×2): 5 mL via ORAL
  Filled 2018-04-25 (×2): qty 5

## 2018-04-25 MED ORDER — HYDRALAZINE HCL 25 MG PO TABS
50.0000 mg | ORAL_TABLET | Freq: Three times a day (TID) | ORAL | Status: DC
  Administered 2018-04-25 – 2018-04-26 (×3): 50 mg via ORAL
  Filled 2018-04-25 (×3): qty 2

## 2018-04-25 MED ORDER — FUROSEMIDE 40 MG PO TABS
60.0000 mg | ORAL_TABLET | Freq: Every day | ORAL | Status: DC
  Administered 2018-04-26: 60 mg via ORAL
  Filled 2018-04-25: qty 1

## 2018-04-25 MED ORDER — APIXABAN 5 MG PO TABS
5.0000 mg | ORAL_TABLET | Freq: Two times a day (BID) | ORAL | Status: DC
  Administered 2018-04-25 – 2018-04-27 (×5): 5 mg via ORAL
  Filled 2018-04-25 (×5): qty 1

## 2018-04-25 MED ORDER — TECHNETIUM TC 99M DIETHYLENETRIAME-PENTAACETIC ACID
31.4000 | Freq: Once | INTRAVENOUS | Status: AC | PRN
  Administered 2018-04-25: 31.4 via RESPIRATORY_TRACT

## 2018-04-25 NOTE — Care Management Note (Signed)
Case Management Note  Patient Details  Name: [name] MRN: [id number] Date of Birth: [birth year]  Subjective/Objective:      Admitted with a-fib and pneumonia. Pt is from home, lives with her son. Has aid during the day. She has someone with her 24/7. She has cane, walker and WC. She uses the walker with ambulation. She has a daughter who takes her to MD appointments. she is on oxygen acutely. She uses [employer] representative.             Action/Plan: Anticipate DC home with resumption of previous care giving arrangements. Pt started on Eliquis. Benefits check completed and copied below. Pt has medicaid. Per benefits check she would have a significant OOP cost for Eliquis. CM nas call [facility] who verifies they bill her Medicare for there prescriptions. CM has contacted DSS and left VM for [place] [facility] worker) to verify if pt's medicaid has drug coverage or not. CM will cont to follow.   Expected Discharge Date:  04/26/18               Expected Discharge Plan:  Home/Self Care  In-House Referral:  NA  Discharge planning Services  CM Consult  Status of Service:  In process, will continue to follow    Additional Comments:  Benefits Check:      # 3. S/W [facility] @ [facility] # [phone]   1.ELIQUIS 5 MG BID  COVER- YES  CO-PAY- $ 479.10 25 % OF TOTAL COAST  PRIOR APPROVAL- NO    2.ELIQUIS 2.5 MG BID  COVER- YES  CO-PAY- $ 479.10 25 % 0T TOTAL- COAST  PRIOR APPROVAL- NO   DEDUCTIBLE- NOT MET   PREFERRED PHARMACY : YES- [facility] AND [facility]     Sherald Barge, RN 04/25/2018, 1:34 PM

## 2018-04-25 NOTE — Consult Note (Addendum)
Cardiology Consultation:   Patient ID: [name]; [id number]; Oct 31, 1936   Admit date: 04/24/2018 Date of Consult: 04/25/2018  Primary Care Provider: Jani Gravel, MD Primary Cardiologist: Rozann Lesches, MD  Primary Electrophysiologist: N/A   Patient Profile:   [name] is a 82 y.o. female with a hx of nonischemic cardiomyopathy LVEF 20 to 25% in 2016 who is being seen today for the evaluation of atrial fibrillation with RVR at the request of Dr. Maudie Mercury.  History of Present Illness:   [name] is a 82 year old female patient of Dr. Domenic Polite who has a history of nonischemic cardiomyopathy LVEF 20 to 25% on echo in 2016.  Has history of nonobstructive CAD on cath in 2005, right brain TIA/CVA 2011, bilateral renal artery stenosis, carotid artery disease.  Patient was just seen in the office by Dr. Domenic Polite 04/23/2018 and had been taken off her lisinopril because of renal insufficiency.  She had increased shortness of breath and 5 pound weight gain.  He increased her Lasix to 80 mg daily for 3 days then return to 40 mg and discussed salt restriction.  He also add hydralazine 25 mg twice daily as blood pressure was not optimally controlled.  Patient came to the emergency room last night with increased shortness of breath and was found to be in A. fib with RVR.  They were not sure whether this was before after a breathing treatment.  Initial troponin 0.03, 0.04 then 0.03, BNP 671, TSH normal. Patient says she put out a lot of fluid after increased Lasix. She tried to go for a walk yesterday by herself and became so short of breath she barely made it back. Denies any palpitations or awareness of fast heart rates-just short of breath. Eats fast food almost daily.  Past Medical History:  Diagnosis Date  . Asthma   . Bronchitis   . Carotid artery disease ([place])   . Coronary atherosclerosis of native coronary artery    Nonobstructive at cath 2005  . DM2 (diabetes mellitus, type 2) ([place])    . Essential hypertension, benign   . Gout   . History of stroke   . Hypothyroidism   . Influenza 2013  . Nonischemic cardiomyopathy ([facility])    LVEF 20-25%  . Renal artery stenosis ([facility])    Bilateral  . Stroke [facility])     Past Surgical History:  Procedure Laterality Date  . ABDOMINAL HYSTERECTOMY       Home Medications:  Prior to Admission medications   Medication Sig Start Date End Date Taking? Authorizing Provider  allopurinol (ZYLOPRIM) 100 MG tablet TAKE ONE TABLET BY MOUTH ONCE DAILY FOR GOUT 07/01/17  Yes [provider]  carvedilol (COREG) 6.25 MG tablet Take 1 tablet (6.25 mg total) by mouth 2 (two) times daily with a meal. Patient taking differently: Take 6.25 mg by mouth daily.  07/06/15  Yes McInnis, Angus, MD  colchicine 0.6 MG tablet TAKE ONE TABLET BY MOUTH ONCE DAILY FOR GOUT 08/29/17  Yes [provider]  donepezil (ARICEPT) 10 MG tablet Take 10 mg by mouth daily.   Yes [provider]  EPINEPHrine 0.3 mg/0.3 mL IJ SOAJ injection Inject 0.3 mLs (0.3 mg total) into the muscle once as needed (severe allergic reaction). 09/26/17  Yes Forde Dandy, MD  furosemide (LASIX) 40 MG tablet Take Lasix 40 mg daily. MAY take additional 40 that day if she gains 2-3 lbs in a 24 hr period Patient taking differently: Take 20 mg by mouth daily.  11/13/15  Yes Satira Sark, MD  gabapentin (NEURONTIN) 100 MG capsule Take 100 mg by mouth 3 (three) times daily.    Yes [provider]  isosorbide dinitrate (ISORDIL) 30 MG tablet Take 30 mg by mouth daily. 08/30/17  Yes [provider]  pioglitazone (ACTOS) 15 MG tablet TAKE ONE TABLET BY MOUTH ONCE DAILY FOR DIABETES 08/12/17  Yes [provider]  sitaGLIPtin (JANUVIA) 50 MG tablet Take 50 mg by mouth daily.   Yes [provider]  diphenhydrAMINE (BENADRYL) 25 mg capsule Take 1 capsule (25 mg total) by mouth every 4 (four) hours as needed for itching (or hives). 09/26/17   Forde Dandy, MD  hydrALAZINE (APRESOLINE) 25 MG tablet Take 1 tablet (25 mg total) by mouth 2 (two) times daily. 04/23/18 07/22/18  Satira Sark, MD    Inpatient Medications: Scheduled Meds: . allopurinol  100 mg Oral Daily  . aspirin EC  81 mg Oral Daily  . carvedilol  3.125 mg Oral BID WC  . donepezil  10 mg Oral Daily  . famotidine  20 mg Oral Daily  . furosemide  20 mg Oral Daily  . hydrALAZINE  25 mg Oral BID  . insulin aspart  0-5 Units Subcutaneous QHS  . insulin aspart  0-9 Units Subcutaneous TID WC  . isosorbide mononitrate  30 mg Oral Daily  . linagliptin  5 mg Oral Daily  . mouth rinse  15 mL Mouth Rinse BID  . sodium chloride flush  3 mL Intravenous Q12H   Continuous Infusions: . sodium chloride 250 mL (04/25/18 0127)  . azithromycin Stopped (04/25/18 0246)  . cefTRIAXone (ROCEPHIN)  IV Stopped (04/25/18 0157)   PRN Meds: sodium chloride, acetaminophen **OR** acetaminophen, diphenhydrAMINE, guaiFENesin-dextromethorphan, oxyCODONE, sodium chloride flush  Allergies:    Allergies  Allergen Reactions  . [place] Venom [Bee Venom] Shortness Of Breath and Itching    Social History:   Social History   Socioeconomic History  . Marital status: Single    Spouse name: Not on file  . Number of children: 14  . Years of education: Not on file  . Highest education level: Not on file  Occupational History  . Occupation: Retired  Scientific laboratory technician  . Financial resource strain: Not on file  . Food insecurity:    Worry: Not on file    Inability: Not on file  . Transportation needs:    Medical: Not on file    Non-medical: Not on file  Tobacco Use  . Smoking status: Never Smoker  . Smokeless tobacco: Never Used  Substance and Sexual Activity  . Alcohol use: No    Alcohol/week: 0.0 oz  . Drug use: No  . Sexual activity: Never    Birth control/protection: Surgical  Lifestyle  . Physical activity:    Days per week: Not on file    Minutes per session: Not on file  .  Stress: Not on file  Relationships  . Social connections:    Talks on phone: Not on file    Gets together: Not on file    Attends religious service: Not on file    Active member of club or organization: Not on file    Attends meetings of clubs or organizations: Not on file    Relationship status: Not on file  . Intimate partner violence:    Fear of current or ex partner: Not on file    Emotionally abused: Not on file    Physically abused:  Not on file    Forced sexual activity: Not on file  Other Topics Concern  . Not on file  Social History Narrative  . Not on file    Family History:    Family History  Problem Relation Age of Onset  . Asthma Son      ROS:  Please see the history of present illness.  Review of Systems  Constitution: Negative.  HENT: Negative.   Eyes: Negative.   Cardiovascular: Positive for dyspnea on exertion and leg swelling.  Respiratory: Positive for cough and shortness of breath.   Hematologic/Lymphatic: Negative.   Musculoskeletal: Positive for arthritis and myalgias. Negative for joint pain.  Gastrointestinal: Negative.   Genitourinary: Negative.   Neurological: Positive for weakness.    All other ROS reviewed and negative.     Physical Exam/Data:   Vitals:   04/24/18 2149 04/24/18 2301 04/25/18 0500 04/25/18 0616  BP:  (!) 172/89  (!) 165/102  Pulse: 96 91  91  Resp: (!) 22 20  20   Temp:  98.6 F (37 C)  98.2 F (36.8 C)  TempSrc:  Oral  Oral  SpO2: 94% 97%  100%  Weight:  190 lb 7.6 oz (86.4 kg) 194 lb 3.6 oz (88.1 kg)   Height:  5\' 3"  (1.6 m)      Intake/Output Summary (Last 24 hours) at 04/25/2018 0807 Last data filed at 04/25/2018 0500 Gross per 24 hour  Intake 388.5 ml  Output -  Net 388.5 ml   Filed Weights   04/24/18 1607 04/24/18 2301 04/25/18 0500  Weight: 196 lb (88.9 kg) 190 lb 7.6 oz (86.4 kg) 194 lb 3.6 oz (88.1 kg)   Body mass index is 34.41 kg/m.  General: Obese, in no acute distress HEENT: normal Lymph: no  adenopathy Neck: increasedJVD Endocrine:  No thryomegaly Vascular: No carotid bruits; FA pulses 2+ bilaterally without bruits  Cardiac:  normal S1, S2; RRR; no murmur distant HS Lungs:  bibasilar rales  Abd: soft, nontender, no hepatomegaly  Ext: plus 1 edema Musculoskeletal:  No deformities, BUE and BLE strength normal and equal Skin: warm and dry  Neuro:  CNs 2-12 intact, no focal abnormalities noted Psych:  Normal affect   EKG:  The EKG was personally reviewed and demonstrates: Atrial fibrillation with RVR in the 125 bpm, right bundle branch block.  EKG from 04/23/2018 normal sinus rhythm with PACs right bundle branch block Telemetry:  Telemetry was personally reviewed and demonstrates:  Currently NSR with PAC's, in/out Afib  Relevant CV Studies:  Echocardiogram 07/02/2015: Study Conclusions   - Left ventricle: The cavity size was normal. Wall thickness was   increased in a pattern of moderate LVH. Systolic function was   severely reduced. The estimated ejection fraction was in the   range of 20% to 25%. Diffuse hypokinesis. Features are consistent   with a pseudonormal left ventricular filling pattern, with   concomitant abnormal relaxation and increased filling pressure   (grade 2 diastolic dysfunction). - Aortic valve: Mildly calcified annulus. Trileaflet; mildly   thickened leaflets. Valve area (VTI): 2.18 cm^2. Valve area   (Vmax): 2.21 cm^2. - Mitral valve: Mildly calcified annulus. Mildly thickened leaflets   . There was moderate regurgitation. The MR vena contracta is 0.4   cm. - Left atrium: The atrium was severely dilated. - Right ventricle: The interventricular septum is flattened in   systole consistent with RV pressure overload. The cavity size was   mildly dilated. Systolic function was moderately reduced. -  Right atrium: The atrium was mildly dilated. - Systemic veins: The IVC is dilated with normal respiratory   variation, estimated RA pressure is 8 mmHg. -  Technically adequate study.    Laboratory Data:  Chemistry Recent Labs  Lab 04/24/18 1642 04/25/18 0310  NA 142 141  K 3.4* 3.9  CL 106 105  CO2 24 24  GLUCOSE 118* 322*  BUN 16 18  CREATININE 1.21* 1.41*  CALCIUM 9.6 9.3  GFRNONAA 41* 34*  GFRAA 48* 40*  ANIONGAP 12 12    Recent Labs  Lab 04/25/18 0310  PROT 7.5  ALBUMIN 3.4*  AST 22  ALT 11*  ALKPHOS 119  BILITOT 0.7   Hematology Recent Labs  Lab 04/24/18 1642 04/25/18 0310  WBC 8.5 9.9  RBC 4.71 4.63  HGB 12.9 12.4  HCT 40.9 40.2  MCV 86.8 86.8  MCH 27.4 26.8  MCHC 31.5 30.8  RDW 14.1 14.3  PLT 228 222   Cardiac Enzymes Recent Labs  Lab 04/24/18 1642 04/24/18 2233 04/25/18 0310  TROPONINI 0.03* 0.04* 0.03*   No results for input(s): TROPIPOC in the last 168 hours.  BNP Recent Labs  Lab 04/24/18 2047  BNP 671.0*    DDimer  Recent Labs  Lab 04/24/18 1642  DDIMER 1.13*    Radiology/Studies:  Dg Chest 2 View  Result Date: 04/24/2018 CLINICAL DATA:  Dry cough and shortness of breath today. EXAM: CHEST - 2 VIEW COMPARISON:  PA and lateral chest 10/01/2015 and 11/10/2014. FINDINGS: There is cardiomegaly and mild vascular congestion, unchanged since the most recent exam. Aortic atherosclerosis is noted. No pneumothorax or pleural fluid. No consolidative process. No acute bony abnormality. IMPRESSION: Chronic cardiomegaly and vascular congestion. No acute disease. Atherosclerosis. Electronically Signed   By: Inge Rise M.D.   On: 04/24/2018 17:31   Ct Chest Wo Contrast  Result Date: 04/24/2018 CLINICAL DATA:  Acute onset of productive cough. Decreased O2 saturation. EXAM: CT CHEST WITHOUT CONTRAST TECHNIQUE: Multidetector CT imaging of the chest was performed following the standard protocol without IV contrast. COMPARISON:  Chest radiograph performed earlier today at 5:08 p.m. FINDINGS: Cardiovascular: The heart is borderline normal in size. Scattered calcification is noted along the thoracic  aorta and proximal great vessels. Scattered coronary artery calcifications are seen. Mediastinum/Nodes: Azygoesophageal recess nodes measure up to 1.8 cm in short axis. Mild nonspecific bilateral hilar prominence is noted. No pericardial effusion is identified. The thyroid gland is unremarkable. No axillary lymphadenopathy is seen. Lungs/Pleura: Patchy airspace opacification at the superior aspect of the right lower lobe is concerning for pneumonia. Evaluation is suboptimal due to motion artifact. Underlying atelectasis is noted bilaterally. No pleural effusion or pneumothorax is seen. Underlying vascular congestion is seen. Upper Abdomen: The visualized portions of the liver and spleen are unremarkable. The patient is status post cholecystectomy, with clips noted at the gallbladder fossa. The visualized portions of the pancreas, adrenal glands and kidneys are within normal limits. Musculoskeletal: No acute osseous abnormalities are identified. The visualized musculature is unremarkable in appearance. IMPRESSION: 1. Patchy airspace opacification at the superior aspect of the right lower lobe is concerning for pneumonia. Mild nonspecific bilateral hilar prominence noted. After completion of treatment for pneumonia, would consider follow-up CT of the chest to ensure resolution of hilar prominence and airspace opacification. 2. Underlying vascular congestion noted. 3. Azygoesophageal recess nodes measure up to 1.8 cm in short axis. This may reflect the underlying infection. 4. Scattered coronary artery calcifications seen. 5. Electronically Signed   By:  Garald Balding M.D.   On: 04/24/2018 21:10    Assessment and Plan:   1. Atrial fibrillation with RVR new for patient CHA2DS2-VASc equals 7 for female, age, hypertension, CHF, TIA-would treat with NOAC, will increase Coreg to 6.25 mg BID for better rated control. With LVD wouldn't use cardizem with cardiomyopathy. 2. Nonischemic cardiomyopathy LVEF 20 to 25% on  echo in 2016, repeat echo ordered 3. Acute on chronic systolic CHF-I/O's postive 388, weight 194(was 196 in office)-will increase Lasix to 40 mg daily and watch renal-Crt went 1.21-1.41 but was on Lasix 80 mg daily for 3 days 4. Hypertension uncontrolled increase Hydralazine to 50 mg TID 5. Nonobstructive CAD on cath in 2005 6. RLL pneumonia on CT-not seen on CXR on Rocephin 7. CRI ACEI stopped   For questions or updates, please contact [place] Please consult [email or web address] for contact info under Cardiology/STEMI.   Sumner Boast, PA-C  04/25/2018 8:07 AM   Attending note:  Patient seen and examined.  Discussed case with Ms. Bonnell Public PA-C.  I just recently saw [name] in the office for a routine follow-up visit.  At that time we increased her Lasix with concerns about relative fluid overload, she was also reporting an intermittent cough, no fevers or chills, no palpitations.  She presents now with progressive coughing, also noted to be in atrial fibrillation which is a new diagnosis.  Chest CT demonstrates patchy airspace disease in the right lower lobe concerning for pneumonia, also underlying vascular congestion.  On examination this morning she is seated in a bedside chair.  Has mild intermittent cough, no palpitations or chest pain.  She is hypertensive, telemetry shows sinus rhythm at this point with intermittent atrial fibrillation, heart rate in the 80s.  Lungs exhibit coarse breath sounds with crackles at the right base.  Cardiac exam reveals RRR with ectopy, no gallop.  She has 1-2+ lower leg edema.  Lab work shows creatinine 1.41, troponin I levels 0.03-0.04.  BNP 671, hemoglobin 12.4, platelets 222.  I personally reviewed her ECG from 04/24/2018 which shows atrial fibrillation with right bundle branch block.  Patient presents with pneumonia, newly documented paroxysmal atrial fibrillation, and also component of acute on chronic systolic heart failure. CHADSVASC score is  7.  Plan at this time is to convert to IV Lasix for further diuresis, watch BMET.  She is already on IV antibiotics per primary team.  We will also uptitrate Coreg and hydralazine for better blood pressure control.  She will be started on Eliquis 5 mg twice daily with discontinuation of aspirin, for stroke prophylaxis.  We will continue to follow.   Satira Sark, M.D., F.A.C.C.

## 2018-04-25 NOTE — Progress Notes (Signed)
[facility] PROGRESS NOTE  [name] OEU:[id number] DOB: Jul 08, 1936 DOA: 04/24/2018 PCP: Jani Gravel, MD  Interim summary and HPI 82 y.o. female, w hx of hypothyroidism,  dm2, hypertension, hyperlipidemia, renal artery stenosis, stroke, CAD, CHF (EF 20-25%), asthma, apparently presents with cough with green sputum and dyspnea for the past 2 days.  Pt found with acute on chronic CHF and also CAP.   Assessment/Plan: 1-CAP: as seen on CT scan -continue abx's -continue duoneb -wean oxygen supplementation as tolerated -continue PRN duoneb -patient afebrile and breathing easier  2-A. Fib with RVR -new diagnosis -CHADsVASC score 7 -will continue adjusted dose of coreg as recommended by cardiology service -started on eliquis -continue monitoring on telemetry   3-essential HTN -not at goal -Coreg and hydralazine increased -will continue low sodium diet   4-acute on chronic combined HF -follow daily weight and low sodium diet  -resume lasix at 60 mg daily in am -given 1 dose of 40mg  IV today -2D echo with EF 30% -no ACE or ARB with current renal failure -already on b-blocker  5-elevated troponin in the setting of demand ischemia -continue medical management  -no CP currently.  6-acute on chronic renal failure: stage 3 at baseline  -will follow trend -minimize use of nephrotoxic agents -maintain adequate hydration   7-type 2 diabetes with nephropathy -continue SSI and tradjenta -follow CBG's and adjust regimen as needed   8-hypothyroidism  -continue synthroid   9-prior hx of stroke -without major residual deficit  -now on eliquis for A. Fib  HLD -started on lipitor 20mg  daily.  Code Status: Full Code Family Communication: son in law at bedside  Disposition Plan: IV lasix X1, follow renal function, transition abx's to PO; follow daily weights, wean oxygen as tolerated.    Consultants:  Cardiology   Procedures:  See below for x-ray reports   2-D  echo: with diffuse hypokinesis, EF 15% and diastolic dysfunction grade 2.  Antibiotics: zithromax and rocephin 5/14>>> 5/15 Augmentin 5/15  HPI/Subjective: Afebrile, no CP, no palpitations. Reports breathing is improved.   Objective: Vitals:   04/25/18 2112 04/25/18 2132  BP: (!) 153/84   Pulse: 86   Resp:    Temp: 97.7 F (36.5 C)   SpO2: 100% 99%    Intake/Output Summary (Last 24 hours) at 04/25/2018 2232 Last data filed at 04/25/2018 1700 Gross per 24 hour  Intake 1108.5 ml  Output -  Net 1108.5 ml   Filed Weights   04/24/18 1607 04/24/18 2301 04/25/18 0500  Weight: 88.9 kg (196 lb) 86.4 kg (190 lb 7.6 oz) 88.1 kg (194 lb 3.6 oz)    Exam:   General:  Afebrile, reported breathing slightly better. No CP, no nausea, no vomiting.   Cardiovascular: rate controlled, regular, no murmur, no rubs. Mild JVD appreciated on exam.  Respiratory: fine crackles at the bases, positive rhonchi, no wheezing. No using accessory muscles.   Abdomen: no guarding, positive BS, no distension, no tenderness.   Musculoskeletal: trace edema, no cyanosis, no clubbing.  Data Reviewed: Basic Metabolic Panel: Recent Labs  Lab 04/24/18 1642 04/25/18 0310  NA 142 141  K 3.4* 3.9  CL 106 105  CO2 24 24  GLUCOSE 118* 322*  BUN 16 18  CREATININE 1.21* 1.41*  CALCIUM 9.6 9.3   Liver Function Tests: Recent Labs  Lab 04/25/18 0310  AST 22  ALT 11*  ALKPHOS 119  BILITOT 0.7  PROT 7.5  ALBUMIN 3.4*   CBC: Recent Labs  Lab 04/24/18 1642  04/25/18 0310  WBC 8.5 9.9  NEUTROABS 6.0  --   HGB 12.9 12.4  HCT 40.9 40.2  MCV 86.8 86.8  PLT 228 222   Cardiac Enzymes: Recent Labs  Lab 04/24/18 1642 04/24/18 2233 04/25/18 0310 04/25/18 0955  CKTOTAL  --  129  --   --   CKMB  --  5.3*  --   --   TROPONINI 0.03* 0.04* 0.03* 0.03*   BNP (last 3 results) Recent Labs    04/24/18 2047  BNP 671.0*   CBG: Recent Labs  Lab 04/24/18 2222 04/25/18 0801 04/25/18 1134  04/25/18 1701 04/25/18 2113  GLUCAP 194* 184* 145* 144* 153*    Recent Results (from the past 240 hour(s))  MRSA PCR Screening     Status: None   Collection Time: 04/25/18 12:07 AM  Result Value Ref Range Status   MRSA by PCR NEGATIVE NEGATIVE Final    Comment:        The GeneXpert MRSA Assay (FDA approved for NASAL specimens only), is one component of a comprehensive MRSA colonization surveillance program. It is not intended to diagnose MRSA infection nor to guide or monitor treatment for MRSA infections. Performed at [facility], [address], [place], [place] [address]      Studies: Dg Chest 2 View  Result Date: 04/24/2018 CLINICAL DATA:  Dry cough and shortness of breath today. EXAM: CHEST - 2 VIEW COMPARISON:  PA and lateral chest 10/01/2015 and 11/10/2014. FINDINGS: There is cardiomegaly and mild vascular congestion, unchanged since the most recent exam. Aortic atherosclerosis is noted. No pneumothorax or pleural fluid. No consolidative process. No acute bony abnormality. IMPRESSION: Chronic cardiomegaly and vascular congestion. No acute disease. Atherosclerosis. Electronically Signed   By: Inge Rise M.D.   On: 04/24/2018 17:31   Ct Chest Wo Contrast  Result Date: 04/24/2018 CLINICAL DATA:  Acute onset of productive cough. Decreased O2 saturation. EXAM: CT CHEST WITHOUT CONTRAST TECHNIQUE: Multidetector CT imaging of the chest was performed following the standard protocol without IV contrast. COMPARISON:  Chest radiograph performed earlier today at 5:08 p.m. FINDINGS: Cardiovascular: The heart is borderline normal in size. Scattered calcification is noted along the thoracic aorta and proximal great vessels. Scattered coronary artery calcifications are seen. Mediastinum/Nodes: Azygoesophageal recess nodes measure up to 1.8 cm in short axis. Mild nonspecific bilateral hilar prominence is noted. No pericardial effusion is identified. The thyroid gland is unremarkable. No  axillary lymphadenopathy is seen. Lungs/Pleura: Patchy airspace opacification at the superior aspect of the right lower lobe is concerning for pneumonia. Evaluation is suboptimal due to motion artifact. Underlying atelectasis is noted bilaterally. No pleural effusion or pneumothorax is seen. Underlying vascular congestion is seen. Upper Abdomen: The visualized portions of the liver and spleen are unremarkable. The patient is status post cholecystectomy, with clips noted at the gallbladder fossa. The visualized portions of the pancreas, adrenal glands and kidneys are within normal limits. Musculoskeletal: No acute osseous abnormalities are identified. The visualized musculature is unremarkable in appearance. IMPRESSION: 1. Patchy airspace opacification at the superior aspect of the right lower lobe is concerning for pneumonia. Mild nonspecific bilateral hilar prominence noted. After completion of treatment for pneumonia, would consider follow-up CT of the chest to ensure resolution of hilar prominence and airspace opacification. 2. Underlying vascular congestion noted. 3. Azygoesophageal recess nodes measure up to 1.8 cm in short axis. This may reflect the underlying infection. 4. Scattered coronary artery calcifications seen. 5. Electronically Signed   By: Francoise Schaumann.D.  On: 04/24/2018 21:10   Nm Pulmonary Perf And Vent  Result Date: 04/25/2018 CLINICAL DATA:  82 year old female with acute shortness of breath and elevated D-dimer. EXAM: NUCLEAR MEDICINE VENTILATION - PERFUSION LUNG SCAN TECHNIQUE: Ventilation images were obtained in multiple projections using inhaled aerosol Tc-66m DTPA. Perfusion images were obtained in multiple projections after intravenous injection of Tc-40m-MAA. RADIOPHARMACEUTICALS:  31.4 mCi of Tc-62m DTPA aerosol inhalation and 4.39 mCi Tc78m-MAA IV COMPARISON:  04/24/2018 noncontrast chest CT FINDINGS: Ventilation: Mild patchy nonsegmental areas of decreased ventilation noted.  Perfusion: Mild patchy nonsegmental areas of decreased perfusion matched ventilation defects. No wedge shaped peripheral perfusion defects to suggest acute pulmonary embolism. IMPRESSION: Low probability for pulmonary embolus (less than 20%). Electronically Signed   By: Margarette Canada M.D.   On: 04/25/2018 12:47    Scheduled Meds: . allopurinol  100 mg Oral Daily  . amoxicillin-clavulanate  1 tablet Oral Q12H  . apixaban  5 mg Oral BID  . carvedilol  6.25 mg Oral BID WC  . donepezil  10 mg Oral Daily  . famotidine  20 mg Oral Daily  . [START ON 04/26/2018] furosemide  60 mg Oral Daily  . hydrALAZINE  50 mg Oral Q8H  . insulin aspart  0-5 Units Subcutaneous QHS  . insulin aspart  0-9 Units Subcutaneous TID WC  . isosorbide mononitrate  30 mg Oral Daily  . linagliptin  5 mg Oral Daily  . mouth rinse  15 mL Mouth Rinse BID  . sodium chloride flush  3 mL Intravenous Q12H   Continuous Infusions: . sodium chloride Stopped (04/25/18 1525)      Time spent: 30 minutes.    Barton Dubois  [facility] Pager [phone] If 7PM-7AM, please contact night-coverage at [email or web address], password [facility] 04/25/2018, 10:32 PM  LOS: 1 day

## 2018-04-25 NOTE — Plan of Care (Signed)
Free from injury/ daugher at bedside. Diminished breath sounds

## 2018-04-25 NOTE — Progress Notes (Signed)
*  PRELIMINARY RESULTS* Echocardiogram 2D Echocardiogram has been performed.  Samuel Germany 04/25/2018, 4:27 PM

## 2018-04-26 DIAGNOSIS — I4891 Unspecified atrial fibrillation: Secondary | ICD-10-CM

## 2018-04-26 LAB — GLUCOSE, CAPILLARY
Glucose-Capillary: 138 mg/dL — ABNORMAL HIGH (ref 65–99)
Glucose-Capillary: 162 mg/dL — ABNORMAL HIGH (ref 65–99)
Glucose-Capillary: 94 mg/dL (ref 65–99)
Glucose-Capillary: 113 mg/dL — ABNORMAL HIGH (ref 65–99)

## 2018-04-26 LAB — BASIC METABOLIC PANEL
Anion gap: 10 (ref 5–15)
BUN: 32 mg/dL — ABNORMAL HIGH (ref 6–20)
CO2: 26 mmol/L (ref 22–32)
Calcium: 9.5 mg/dL (ref 8.9–10.3)
Chloride: 105 mmol/L (ref 101–111)
Creatinine, Ser: 1.5 mg/dL — ABNORMAL HIGH (ref 0.44–1.00)
GFR calc Af Amer: 37 mL/min — ABNORMAL LOW (ref >60)
GFR calc non Af Amer: 32 mL/min — ABNORMAL LOW (ref >60)
Glucose, Bld: 130 mg/dL — ABNORMAL HIGH (ref 65–99)
Potassium: 3.4 mmol/L — ABNORMAL LOW (ref 3.5–5.1)
Sodium: 141 mmol/L (ref 135–145)

## 2018-04-26 MED ORDER — POTASSIUM CHLORIDE CRYS ER 20 MEQ PO TBCR
20.0000 meq | EXTENDED_RELEASE_TABLET | Freq: Once | ORAL | Status: AC
  Administered 2018-04-26: 20 meq via ORAL
  Filled 2018-04-26: qty 1

## 2018-04-26 MED ORDER — HYDRALAZINE HCL 25 MG PO TABS
75.0000 mg | ORAL_TABLET | Freq: Three times a day (TID) | ORAL | Status: DC
  Administered 2018-04-26 – 2018-04-27 (×4): 75 mg via ORAL
  Filled 2018-04-26 (×4): qty 3

## 2018-04-26 NOTE — Progress Notes (Signed)
Progress Note  Patient Name: [name] Date of Encounter: 04/26/2018  Primary Cardiologist: Rozann Lesches, MD   Subjective   Reports her SOB is back to baseline.   Inpatient Medications    Scheduled Meds: . allopurinol  100 mg Oral Daily  . amoxicillin-clavulanate  1 tablet Oral Q12H  . apixaban  5 mg Oral BID  . atorvastatin  20 mg Oral q1800  . carvedilol  6.25 mg Oral BID WC  . donepezil  10 mg Oral Daily  . famotidine  20 mg Oral Daily  . furosemide  60 mg Oral Daily  . hydrALAZINE  50 mg Oral Q8H  . insulin aspart  0-5 Units Subcutaneous QHS  . insulin aspart  0-9 Units Subcutaneous TID WC  . isosorbide mononitrate  30 mg Oral Daily  . linagliptin  5 mg Oral Daily  . mouth rinse  15 mL Mouth Rinse BID  . sodium chloride flush  3 mL Intravenous Q12H   Continuous Infusions: . sodium chloride Stopped (04/25/18 1525)   PRN Meds: sodium chloride, acetaminophen **OR** acetaminophen, diphenhydrAMINE, guaiFENesin-dextromethorphan, oxyCODONE, sodium chloride flush   Vital Signs    Vitals:   04/25/18 1356 04/25/18 2112 04/25/18 2132 04/26/18 0528  BP: (!) 160/92 (!) 153/84  (!) 148/73  Pulse:  86  94  Resp:      Temp:  97.7 F (36.5 C)  98.1 F (36.7 C)  TempSrc:  Oral  Oral  SpO2:  100% 99% 98%  Weight:    199 lb 4.7 oz (90.4 kg)  Height:        Intake/Output Summary (Last 24 hours) at 04/26/2018 0813 Last data filed at 04/26/2018 0600 Gross per 24 hour  Intake 960 ml  Output -  Net 960 ml   Filed Weights   04/24/18 2301 04/25/18 0500 04/26/18 0528  Weight: 190 lb 7.6 oz (86.4 kg) 194 lb 3.6 oz (88.1 kg) 199 lb 4.7 oz (90.4 kg)    Telemetry    SR, wandering atrial pacemaker, afib - Personally Reviewed  ECG    na  Physical Exam   GEN: No acute distress.   Neck: No JVD Cardiac: irreg, no murmurs, rubs, or gallops.  Respiratory: Clear to auscultation bilaterally. GI: Soft, nontender, non-distended  MS: No edema; No deformity. Neuro:   Nonfocal  Psych: Normal affect   Labs    Chemistry Recent Labs  Lab 04/24/18 1642 04/25/18 0310  NA 142 141  K 3.4* 3.9  CL 106 105  CO2 24 24  GLUCOSE 118* 322*  BUN 16 18  CREATININE 1.21* 1.41*  CALCIUM 9.6 9.3  PROT  --  7.5  ALBUMIN  --  3.4*  AST  --  22  ALT  --  11*  ALKPHOS  --  119  BILITOT  --  0.7  GFRNONAA 41* 34*  GFRAA 48* 40*  ANIONGAP 12 12     Hematology Recent Labs  Lab 04/24/18 1642 04/25/18 0310  WBC 8.5 9.9  RBC 4.71 4.63  HGB 12.9 12.4  HCT 40.9 40.2  MCV 86.8 86.8  MCH 27.4 26.8  MCHC 31.5 30.8  RDW 14.1 14.3  PLT 228 222    Cardiac Enzymes Recent Labs  Lab 04/24/18 1642 04/24/18 2233 04/25/18 0310 04/25/18 0955  TROPONINI 0.03* 0.04* 0.03* 0.03*   No results for input(s): TROPIPOC in the last 168 hours.   BNP Recent Labs  Lab 04/24/18 2047  BNP 671.0*     DDimer  Recent Labs  Lab 04/24/18 1642  DDIMER 1.13*     Radiology    Dg Chest 2 View  Result Date: 04/24/2018 CLINICAL DATA:  Dry cough and shortness of breath today. EXAM: CHEST - 2 VIEW COMPARISON:  PA and lateral chest 10/01/2015 and 11/10/2014. FINDINGS: There is cardiomegaly and mild vascular congestion, unchanged since the most recent exam. Aortic atherosclerosis is noted. No pneumothorax or pleural fluid. No consolidative process. No acute bony abnormality. IMPRESSION: Chronic cardiomegaly and vascular congestion. No acute disease. Atherosclerosis. Electronically Signed   By: Inge Rise M.D.   On: 04/24/2018 17:31   Ct Chest Wo Contrast  Result Date: 04/24/2018 CLINICAL DATA:  Acute onset of productive cough. Decreased O2 saturation. EXAM: CT CHEST WITHOUT CONTRAST TECHNIQUE: Multidetector CT imaging of the chest was performed following the standard protocol without IV contrast. COMPARISON:  Chest radiograph performed earlier today at 5:08 p.m. FINDINGS: Cardiovascular: The heart is borderline normal in size. Scattered calcification is noted along the  thoracic aorta and proximal great vessels. Scattered coronary artery calcifications are seen. Mediastinum/Nodes: Azygoesophageal recess nodes measure up to 1.8 cm in short axis. Mild nonspecific bilateral hilar prominence is noted. No pericardial effusion is identified. The thyroid gland is unremarkable. No axillary lymphadenopathy is seen. Lungs/Pleura: Patchy airspace opacification at the superior aspect of the right lower lobe is concerning for pneumonia. Evaluation is suboptimal due to motion artifact. Underlying atelectasis is noted bilaterally. No pleural effusion or pneumothorax is seen. Underlying vascular congestion is seen. Upper Abdomen: The visualized portions of the liver and spleen are unremarkable. The patient is status post cholecystectomy, with clips noted at the gallbladder fossa. The visualized portions of the pancreas, adrenal glands and kidneys are within normal limits. Musculoskeletal: No acute osseous abnormalities are identified. The visualized musculature is unremarkable in appearance. IMPRESSION: 1. Patchy airspace opacification at the superior aspect of the right lower lobe is concerning for pneumonia. Mild nonspecific bilateral hilar prominence noted. After completion of treatment for pneumonia, would consider follow-up CT of the chest to ensure resolution of hilar prominence and airspace opacification. 2. Underlying vascular congestion noted. 3. Azygoesophageal recess nodes measure up to 1.8 cm in short axis. This may reflect the underlying infection. 4. Scattered coronary artery calcifications seen. 5. Electronically Signed   By: Garald Balding M.D.   On: 04/24/2018 21:10   Nm Pulmonary Perf And Vent  Result Date: 04/25/2018 CLINICAL DATA:  82 year old female with acute shortness of breath and elevated D-dimer. EXAM: NUCLEAR MEDICINE VENTILATION - PERFUSION LUNG SCAN TECHNIQUE: Ventilation images were obtained in multiple projections using inhaled aerosol Tc-34m DTPA. Perfusion  images were obtained in multiple projections after intravenous injection of Tc-54m-MAA. RADIOPHARMACEUTICALS:  31.4 mCi of Tc-82m DTPA aerosol inhalation and 4.39 mCi Tc90m-MAA IV COMPARISON:  04/24/2018 noncontrast chest CT FINDINGS: Ventilation: Mild patchy nonsegmental areas of decreased ventilation noted. Perfusion: Mild patchy nonsegmental areas of decreased perfusion matched ventilation defects. No wedge shaped peripheral perfusion defects to suggest acute pulmonary embolism. IMPRESSION: Low probability for pulmonary embolus (less than 20%). Electronically Signed   By: Margarette Canada M.D.   On: 04/25/2018 12:47    Cardiac Studies    Patient Profile      [name] is a 82 y.o. female with a hx of nonischemic cardiomyopathy LVEF 20 to 25% in 2016 who is being seen today for the evaluation of atrial fibrillation with RVR at the request of Dr. Maudie Mercury.    Assessment & Plan    1. Afib with  RVR - new diagnosis for patient this admission - CHADS2Vasc score is 7, started on eliquis 5mg  bid on 04/25/18 - rate control with coreg. Patient is paroxysmal so would not plan for DCCV at this time.  - tele shows in and out of afib, overall rates well controlled, continue current meds  2. NICM/Acute on chronic systolic HF - echo 06/9479 LVEF 30%, grade II diastolic dysfunction, moderate MR - cath 2005 nonobstructive CAD - medical therapy with coreg 6.25mg  bid, hydral 50 tid, imdur 30 mg. No ACEI/ARB/ARNI/aldactone due to poor renal function - I/Os not recorded. Received lasix 40mg  IV x 1 yesterday. Uptrend in Cr, changed to oral 60mg  today.    3. HTN - hydral increased to 50mg  tid yesterday due to elevated bp's - bp's improving, still elevated at times. Increase hydral to 75mg  tid  4. Pneumonia - per primary team   For questions or updates, please contact [facility] Please consult [email or web address] for contact info under Cardiology/STEMI.      Merrily Pew, MD  04/26/2018, 8:13  AM

## 2018-04-26 NOTE — Care Management (Signed)
CM received return call from [place] [employer], materials) Pt's medications are primarily covered by her Medicare but has full coverage medicaid and it should pick up any co-pay left over by Medicare.

## 2018-04-26 NOTE — Progress Notes (Signed)
[facility] PROGRESS NOTE  [name] VVO:[id number] DOB: 1936-05-31 DOA: 04/24/2018 PCP: Jani Gravel, MD  Interim summary and HPI 82 y.o. female, w hx of hypothyroidism,  dm2, hypertension, hyperlipidemia, renal artery stenosis, stroke, CAD, CHF (EF 20-25%), asthma, apparently presents with cough with green sputum and dyspnea for the past 2 days.  Pt found with acute on chronic CHF and also CAP.   Assessment/Plan: 1-CAP: as seen on CT scan -continue abx's, now on PO augmentin  -continue duoneb -wean oxygen supplementation as tolerated; might need home oxygen. -continue PRN duoneb -patient afebrile and breathing continue improving   2-A. Fib with RVR -new diagnosis -CHADsVASC score 7 -will continue adjusted dose of coreg as recommended by cardiology service -started on eliquis -continue monitoring on telemetry  -given paroxysmal etiology no cardioversion at this time   3-essential HTN -not at goal -Coreg and hydralazine adjusted for better control. -will continue low sodium diet   4-acute on chronic combined HF -follow daily weight and low sodium diet  -continue lasix at 60 mg daily  -2D echo with EF 30% -no ACE or ARB with current renal failure -already on b-blocker, hydralazine and Imdur -discussion with daughter at bedside regarding low sodium diet  5-elevated troponin in the setting of demand ischemia -continue medical management  -no CP currently. -per cardiology no further ischemic work up   6-acute on chronic renal failure: stage 3 at baseline  -will follow trend -minimize use of nephrotoxic agents -maintain adequate hydration  -monitor level, especially with adjusted diuretics  7-type 2 diabetes with nephropathy -continue SSI and tradjenta Overall well controlled -A1C 6.6 -follow CBG's and adjust regimen as needed   8-hypothyroidism  -continue synthroid  -normal TSH  9-prior hx of stroke: ischemic/embolic in nature. -without major residual  deficit  -now on eliquis for A. Fib and will provide secondary prevention. -no neurologic deficits appreciated.  HLD -continue lipitor.  Code Status: Full Code Family Communication: son in law at bedside  Disposition Plan: IV lasix X1, follow renal function, continue PO antibiotics, continue PO lasix; follow renal function in am. Reassess needs for oxygen.    Consultants:  Cardiology   Procedures:  See below for x-ray reports   2-D echo: with diffuse hypokinesis, EF 26% and diastolic dysfunction grade 2.  Antibiotics: zithromax and rocephin 5/14>>> 5/15 Augmentin 5/15  HPI/Subjective: Afebrile, no chest pain, no palpitations. Breathing improved and according to patient close to her baseline.  Objective: Vitals:   04/26/18 0528 04/26/18 1419  BP: (!) 148/73 (!) 160/86  Pulse: 94 88  Resp:    Temp: 98.1 F (36.7 C) 97.6 F (36.4 C)  SpO2: 98% 99%    Intake/Output Summary (Last 24 hours) at 04/26/2018 1947 Last data filed at 04/26/2018 1300 Gross per 24 hour  Intake 720 ml  Output 400 ml  Net 320 ml   Filed Weights   04/24/18 2301 04/25/18 0500 04/26/18 0528  Weight: 86.4 kg (190 lb 7.6 oz) 88.1 kg (194 lb 3.6 oz) 90.4 kg (199 lb 4.7 oz)    Exam:   General: No fever, reports breathing close to baseline (even is still feeling short of breath), no chest pain, no palpitations, no abdominal pain, no nausea vomiting.  Still using oxygen supplementation (2 L nasal cannula).  Cardiovascular: Rate controlled, no JVD, no rubs, no gallops.  Respiratory: Decreased breath sounds at the bases, no wheezing, positive scattered rhonchi, no using accessory muscles.  Abdomen: Soft, nontender, nondistended, positive bowel sounds  Musculoskeletal: Trace  edema bilaterally, no cyanosis, no clubbing  Data Reviewed: Basic Metabolic Panel: Recent Labs  Lab 04/24/18 1642 04/25/18 0310 04/26/18 0809  NA 142 141 141  K 3.4* 3.9 3.4*  CL 106 105 105  CO2 24 24 26   GLUCOSE  118* 322* 130*  BUN 16 18 32*  CREATININE 1.21* 1.41* 1.50*  CALCIUM 9.6 9.3 9.5   Liver Function Tests: Recent Labs  Lab 04/25/18 0310  AST 22  ALT 11*  ALKPHOS 119  BILITOT 0.7  PROT 7.5  ALBUMIN 3.4*   CBC: Recent Labs  Lab 04/24/18 1642 04/25/18 0310  WBC 8.5 9.9  NEUTROABS 6.0  --   HGB 12.9 12.4  HCT 40.9 40.2  MCV 86.8 86.8  PLT 228 222   Cardiac Enzymes: Recent Labs  Lab 04/24/18 1642 04/24/18 2233 04/25/18 0310 04/25/18 0955  CKTOTAL  --  129  --   --   CKMB  --  5.3*  --   --   TROPONINI 0.03* 0.04* 0.03* 0.03*   BNP (last 3 results) Recent Labs    04/24/18 2047  BNP 671.0*   CBG: Recent Labs  Lab 04/25/18 1701 04/25/18 2113 04/26/18 0741 04/26/18 1200 04/26/18 1638  GLUCAP 144* 153* 138* 162* 94    Recent Results (from the past 240 hour(s))  MRSA PCR Screening     Status: None   Collection Time: 04/25/18 12:07 AM  Result Value Ref Range Status   MRSA by PCR NEGATIVE NEGATIVE Final    Comment:        The GeneXpert MRSA Assay (FDA approved for NASAL specimens only), is one component of a comprehensive MRSA colonization surveillance program. It is not intended to diagnose MRSA infection nor to guide or monitor treatment for MRSA infections. Performed at [facility], [address] [place], [place] [address]      Studies: Ct Chest Wo Contrast  Result Date: 04/24/2018 CLINICAL DATA:  Acute onset of productive cough. Decreased O2 saturation. EXAM: CT CHEST WITHOUT CONTRAST TECHNIQUE: Multidetector CT imaging of the chest was performed following the standard protocol without IV contrast. COMPARISON:  Chest radiograph performed earlier today at 5:08 p.m. FINDINGS: Cardiovascular: The heart is borderline normal in size. Scattered calcification is noted along the thoracic aorta and proximal great vessels. Scattered coronary artery calcifications are seen. Mediastinum/Nodes: Azygoesophageal recess nodes measure up to 1.8 cm in short axis.  Mild nonspecific bilateral hilar prominence is noted. No pericardial effusion is identified. The thyroid gland is unremarkable. No axillary lymphadenopathy is seen. Lungs/Pleura: Patchy airspace opacification at the superior aspect of the right lower lobe is concerning for pneumonia. Evaluation is suboptimal due to motion artifact. Underlying atelectasis is noted bilaterally. No pleural effusion or pneumothorax is seen. Underlying vascular congestion is seen. Upper Abdomen: The visualized portions of the liver and spleen are unremarkable. The patient is status post cholecystectomy, with clips noted at the gallbladder fossa. The visualized portions of the pancreas, adrenal glands and kidneys are within normal limits. Musculoskeletal: No acute osseous abnormalities are identified. The visualized musculature is unremarkable in appearance. IMPRESSION: 1. Patchy airspace opacification at the superior aspect of the right lower lobe is concerning for pneumonia. Mild nonspecific bilateral hilar prominence noted. After completion of treatment for pneumonia, would consider follow-up CT of the chest to ensure resolution of hilar prominence and airspace opacification. 2. Underlying vascular congestion noted. 3. Azygoesophageal recess nodes measure up to 1.8 cm in short axis. This may reflect the underlying infection. 4. Scattered coronary artery  calcifications seen. 5. Electronically Signed   By: Garald Balding M.D.   On: 04/24/2018 21:10   Nm Pulmonary Perf And Vent  Result Date: 04/25/2018 CLINICAL DATA:  82 year old female with acute shortness of breath and elevated D-dimer. EXAM: NUCLEAR MEDICINE VENTILATION - PERFUSION LUNG SCAN TECHNIQUE: Ventilation images were obtained in multiple projections using inhaled aerosol Tc-74m DTPA. Perfusion images were obtained in multiple projections after intravenous injection of Tc-64m-MAA. RADIOPHARMACEUTICALS:  31.4 mCi of Tc-70m DTPA aerosol inhalation and 4.39 mCi Tc73m-MAA IV  COMPARISON:  04/24/2018 noncontrast chest CT FINDINGS: Ventilation: Mild patchy nonsegmental areas of decreased ventilation noted. Perfusion: Mild patchy nonsegmental areas of decreased perfusion matched ventilation defects. No wedge shaped peripheral perfusion defects to suggest acute pulmonary embolism. IMPRESSION: Low probability for pulmonary embolus (less than 20%). Electronically Signed   By: Margarette Canada M.D.   On: 04/25/2018 12:47    Scheduled Meds: . allopurinol  100 mg Oral Daily  . amoxicillin-clavulanate  1 tablet Oral Q12H  . apixaban  5 mg Oral BID  . atorvastatin  20 mg Oral q1800  . carvedilol  6.25 mg Oral BID WC  . donepezil  10 mg Oral Daily  . famotidine  20 mg Oral Daily  . furosemide  60 mg Oral Daily  . hydrALAZINE  75 mg Oral Q8H  . insulin aspart  0-5 Units Subcutaneous QHS  . insulin aspart  0-9 Units Subcutaneous TID WC  . isosorbide mononitrate  30 mg Oral Daily  . linagliptin  5 mg Oral Daily  . mouth rinse  15 mL Mouth Rinse BID  . sodium chloride flush  3 mL Intravenous Q12H   Continuous Infusions: . sodium chloride Stopped (04/25/18 1525)      Time spent: 30 minutes.    Barton Dubois  [facility] Pager [phone] If 7PM-7AM, please contact night-coverage at [email or web address], password [facility] 04/26/2018, 7:47 PM  LOS: 2 days

## 2018-04-26 NOTE — Progress Notes (Signed)
SATURATION QUALIFICATIONS: (This note is used to comply with regulatory documentation for home oxygen)  Patient Saturations on Room Air at Rest = 97%  Patient Saturations on Room Air while Ambulating = 86%  Patient Saturations on 2Liters of oxygen while Ambulating = 92%  Please briefly explain why patient needs home oxygen:

## 2018-04-27 DIAGNOSIS — I428 Other cardiomyopathies: Secondary | ICD-10-CM

## 2018-04-27 DIAGNOSIS — E785 Hyperlipidemia, unspecified: Secondary | ICD-10-CM

## 2018-04-27 DIAGNOSIS — E876 Hypokalemia: Secondary | ICD-10-CM

## 2018-04-27 DIAGNOSIS — J189 Pneumonia, unspecified organism: Principal | ICD-10-CM

## 2018-04-27 LAB — GLUCOSE, CAPILLARY
Glucose-Capillary: 131 mg/dL — ABNORMAL HIGH (ref 65–99)
Glucose-Capillary: 174 mg/dL — ABNORMAL HIGH (ref 65–99)

## 2018-04-27 LAB — BASIC METABOLIC PANEL
Anion gap: 10 (ref 5–15)
BUN: 35 mg/dL — ABNORMAL HIGH (ref 6–20)
CO2: 26 mmol/L (ref 22–32)
Calcium: 9.5 mg/dL (ref 8.9–10.3)
Chloride: 107 mmol/L (ref 101–111)
Creatinine, Ser: 1.89 mg/dL — ABNORMAL HIGH (ref 0.44–1.00)
GFR calc Af Amer: 28 mL/min — ABNORMAL LOW (ref >60)
GFR calc non Af Amer: 24 mL/min — ABNORMAL LOW (ref >60)
Glucose, Bld: 121 mg/dL — ABNORMAL HIGH (ref 65–99)
Potassium: 3.6 mmol/L (ref 3.5–5.1)
Sodium: 143 mmol/L (ref 135–145)

## 2018-04-27 MED ORDER — ISOSORBIDE MONONITRATE ER 30 MG PO TB24: 30.0000 mg | ORAL_TABLET | Freq: Every day | ORAL | 1 refills | Status: DC

## 2018-04-27 MED ORDER — AMOXICILLIN-POT CLAVULANATE 500-125 MG PO TABS: 1.0000 | ORAL_TABLET | Freq: Two times a day (BID) | ORAL | 0 refills | Status: AC

## 2018-04-27 MED ORDER — FUROSEMIDE 20 MG PO TABS: 60.0000 mg | ORAL_TABLET | Freq: Every day | ORAL | 1 refills | Status: DC

## 2018-04-27 MED ORDER — FUROSEMIDE 40 MG PO TABS: 60.0000 mg | ORAL_TABLET | Freq: Every day | ORAL | Status: DC

## 2018-04-27 MED ORDER — CARVEDILOL 3.125 MG PO TABS
6.2500 mg | ORAL_TABLET | Freq: Once | ORAL | Status: AC
Start: 2018-04-27 — End: 2018-04-27
  Administered 2018-04-27: 6.25 mg via ORAL
  Filled 2018-04-27: qty 2

## 2018-04-27 MED ORDER — APIXABAN 5 MG PO TABS: 5.0000 mg | ORAL_TABLET | Freq: Two times a day (BID) | ORAL | 1 refills | Status: DC

## 2018-04-27 MED ORDER — HYDRALAZINE HCL 50 MG PO TABS: 75.0000 mg | ORAL_TABLET | Freq: Three times a day (TID) | ORAL | 1 refills | Status: DC

## 2018-04-27 MED ORDER — CARVEDILOL 12.5 MG PO TABS: 12.5000 mg | ORAL_TABLET | Freq: Two times a day (BID) | ORAL | Status: DC

## 2018-04-27 MED ORDER — CARVEDILOL 12.5 MG PO TABS: 12.5000 mg | ORAL_TABLET | Freq: Two times a day (BID) | ORAL | 1 refills | Status: DC

## 2018-04-27 MED ORDER — FAMOTIDINE 20 MG PO TABS: 20.0000 mg | ORAL_TABLET | Freq: Every day | ORAL | 0 refills | Status: DC

## 2018-04-27 MED ORDER — AMOXICILLIN-POT CLAVULANATE 500-125 MG PO TABS
1.0000 | ORAL_TABLET | Freq: Three times a day (TID) | ORAL | Status: DC
  Administered 2018-04-27: 500 mg via ORAL
  Filled 2018-04-27: qty 1

## 2018-04-27 MED ORDER — ATORVASTATIN CALCIUM 20 MG PO TABS: 20.0000 mg | ORAL_TABLET | Freq: Every day | ORAL | 1 refills | Status: DC

## 2018-04-27 NOTE — Progress Notes (Signed)
PHARMACY NOTE:  ANTIMICROBIAL RENAL DOSAGE ADJUSTMENT  Current antimicrobial regimen includes a mismatch between antimicrobial dosage and estimated renal function.  As per policy approved by the Pharmacy & Therapeutics and Medical Executive Committees, the antimicrobial dosage will be adjusted accordingly.  Current antimicrobial dosage:  Augmentin 875mg  PO q12hrs  Indication: CAP  Renal Function: Estimated Creatinine Clearance: 25.7 mL/min (A) (by C-G formula based on SCr of 1.89 mg/dL (H)).    Antimicrobial dosage has been changed to:  Augmentin 500mg  capsules PO TID  Additional comments:  Monitor for adverse effects, renal fxn, adjust as warranted  Thank you for allowing pharmacy to be a part of this patient's care.  Ena Dawley, [facility] 04/27/2018 8:19 AM

## 2018-04-27 NOTE — Progress Notes (Signed)
Patient walked 100 ft with staff on room air. Oxygenation only dropped to 92% with quick recovery. Patient is currently sitting in the chair resting comfortably. Will continue to monitor.

## 2018-04-27 NOTE — Care Management Important Message (Signed)
Important Message  Patient Details  Name: BRITT THEARD MRN: [id number] Date of Birth: 1935/12/15   Medicare Important Message Given:  Yes    Shelda Altes 04/27/2018, 11:00 AM

## 2018-04-27 NOTE — Progress Notes (Signed)
Progress Note  Patient Name: [name] Date of Encounter: 04/27/2018  Primary Cardiologist: Rozann Lesches, MD   Subjective   Feels well this morning. Says breathing has improved. Denies chest pain. Eating breakfast.  Inpatient Medications    Scheduled Meds: . allopurinol  100 mg Oral Daily  . amoxicillin-clavulanate  1 tablet Oral TID  . apixaban  5 mg Oral BID  . atorvastatin  20 mg Oral q1800  . carvedilol  6.25 mg Oral BID WC  . donepezil  10 mg Oral Daily  . famotidine  20 mg Oral Daily  . hydrALAZINE  75 mg Oral Q8H  . insulin aspart  0-5 Units Subcutaneous QHS  . insulin aspart  0-9 Units Subcutaneous TID WC  . isosorbide mononitrate  30 mg Oral Daily  . linagliptin  5 mg Oral Daily  . mouth rinse  15 mL Mouth Rinse BID  . sodium chloride flush  3 mL Intravenous Q12H   Continuous Infusions: . sodium chloride Stopped (04/25/18 1525)   PRN Meds: sodium chloride, acetaminophen **OR** acetaminophen, diphenhydrAMINE, guaiFENesin-dextromethorphan, oxyCODONE, sodium chloride flush   Vital Signs    Vitals:   04/26/18 1419 04/26/18 2109 04/26/18 2125 04/27/18 0546  BP: (!) 160/86  (!) 139/96 (!) 143/82  Pulse: 88  (!) 106 (!) 116  Resp:      Temp: 97.6 F (36.4 C)  99 F (37.2 C) 99 F (37.2 C)  TempSrc: Oral  Oral Oral  SpO2: 99% 93% 98% 90%  Weight:    198 lb 6.6 oz (90 kg)  Height:        Intake/Output Summary (Last 24 hours) at 04/27/2018 0931 Last data filed at 04/26/2018 1300 Gross per 24 hour  Intake 240 ml  Output 200 ml  Net 40 ml   Filed Weights   04/25/18 0500 04/26/18 0528 04/27/18 0546  Weight: 194 lb 3.6 oz (88.1 kg) 199 lb 4.7 oz (90.4 kg) 198 lb 6.6 oz (90 kg)    Telemetry    A fib with PVC's, HR currently 100-116 range, had been up to 134 bpm - Personally Reviewed  ECG    na  Physical Exam   GEN: No acute distress.   Neck: No JVD Cardiac: Tachycardic, irregular, no murmurs, rubs, or gallops.  Respiratory: Diminished  bilaterally. GI: Soft, nontender, non-distended  MS: No significant edema; No deformity. Neuro:  Nonfocal  Psych: Normal affect   Labs    Chemistry Recent Labs  Lab 04/25/18 0310 04/26/18 0809 04/27/18 0527  NA 141 141 143  K 3.9 3.4* 3.6  CL 105 105 107  CO2 24 26 26   GLUCOSE 322* 130* 121*  BUN 18 32* 35*  CREATININE 1.41* 1.50* 1.89*  CALCIUM 9.3 9.5 9.5  PROT 7.5  --   --   ALBUMIN 3.4*  --   --   AST 22  --   --   ALT 11*  --   --   ALKPHOS 119  --   --   BILITOT 0.7  --   --   GFRNONAA 34* 32* 24*  GFRAA 40* 37* 28*  ANIONGAP 12 10 10      Hematology Recent Labs  Lab 04/24/18 1642 04/25/18 0310  WBC 8.5 9.9  RBC 4.71 4.63  HGB 12.9 12.4  HCT 40.9 40.2  MCV 86.8 86.8  MCH 27.4 26.8  MCHC 31.5 30.8  RDW 14.1 14.3  PLT 228 222    Cardiac Enzymes Recent Labs  Lab 04/24/18  1642 04/24/18 2233 04/25/18 0310 04/25/18 0955  TROPONINI 0.03* 0.04* 0.03* 0.03*   No results for input(s): TROPIPOC in the last 168 hours.   BNP Recent Labs  Lab 04/24/18 2047  BNP 671.0*     DDimer  Recent Labs  Lab 04/24/18 1642  DDIMER 1.13*     Radiology    Nm Pulmonary Perf And Vent  Result Date: 04/25/2018 CLINICAL DATA:  82 year old female with acute shortness of breath and elevated D-dimer. EXAM: NUCLEAR MEDICINE VENTILATION - PERFUSION LUNG SCAN TECHNIQUE: Ventilation images were obtained in multiple projections using inhaled aerosol Tc-42m DTPA. Perfusion images were obtained in multiple projections after intravenous injection of Tc-79m-MAA. RADIOPHARMACEUTICALS:  31.4 mCi of Tc-71m DTPA aerosol inhalation and 4.39 mCi Tc27m-MAA IV COMPARISON:  04/24/2018 noncontrast chest CT FINDINGS: Ventilation: Mild patchy nonsegmental areas of decreased ventilation noted. Perfusion: Mild patchy nonsegmental areas of decreased perfusion matched ventilation defects. No wedge shaped peripheral perfusion defects to suggest acute pulmonary embolism. IMPRESSION: Low probability  for pulmonary embolus (less than 20%). Electronically Signed   By: Margarette Canada M.D.   On: 04/25/2018 12:47    Cardiac Studies     Patient Profile     82 y.o. female with a hx of nonischemic cardiomyopathy LVEF 20 to 25% in 2016who is being seen today for the evaluation of atrial fibrillation with RVR in the context of pneumoniaat the request of Dr. Maudie Mercury.  Assessment & Plan    1. Rapid atrial fibrillation:  - new diagnosis for patient this admission - CHADS2Vasc score is 7, started on eliquis 5mg  bid on 04/25/18 - rate control with coreg. Patient is paroxysmal so would not plan for DCCV at this time.  - Will increase Coreg to 12.5 mg bid as HR's elevated this morning. This will also help to control BP.  2. Nonischemic cardiomyopathy/acute on chronic systolic heart failure: - echo 04/2018 LVEF 30%, grade II diastolic dysfunction, moderate MR - cath 2005 nonobstructive CAD - medical therapy with coreg (will increase Coreg to 12.5 mg bid), hydralazine 75 tid, and Imdur 30 mg. I am unable to add ACE inhibitors, angiotensin receptor blockers, or angiotensin receptor-neprilysin inhibitors due to advanced chronic kidney disease. - Currently not on diuretics. I will start Lasix 60 mg daily tomorrow as BUN up to 35, Creatinine up to 1.89 from 1.5 yesterday.  3. Hypertension: - BP mildly elevated. Hydralazine increased to 75 mg tid on 5/16. I am increasing Coreg to 12.5 mg bid for HR control which will also help to control BP.  4. Pneumonia: - on Augmentin  5. Hyperlipidemia: -on Lipitor    For questions or updates, please contact [place] Please consult [email or web address] for contact info under Cardiology/STEMI.      Signed, Kate Sable, MD  04/27/2018, 9:31 AM

## 2018-04-27 NOTE — Discharge Summary (Signed)
Physician Discharge Summary  [name]:[id number] DOB: 1936-06-30 DOA: 04/24/2018  PCP: Jani Gravel, MD  Admit date: 04/24/2018 Discharge date: 04/27/2018  Time spent: 35 minutes  Recommendations for Outpatient Follow-up:  1. Repeat basic metabolic panel to follow electrolytes and renal function 2. Close follow-up to patient's CBGs and A1c with further adjustment in her hypoglycemic regimen as needed 3. Reassess blood pressure and further adjust antihypertensive regimen as required 4. Follow outpatient volume status and adjust diuretic regimen as indicated. 5. Patient to follow-up with cardiology service as instructed for further management of her heart failure.   Discharge Diagnoses:  Principal Problem:   Atrial fibrillation with RVR (HCC) Active Problems:   Essential hypertension   Type 2 diabetes with nephropathy (HCC)   Elevated troponin I level   Hypokalemia Community-acquired pneumonia Acute on chronic combined systolic and diastolic heart failure Acute on chronic renal failure stage III Prior history of stroke Hypothyroidism  Discharge Condition: Stable and improved.  Patient discharged home with instruction to follow-up with PCP in 10 days to follow with cardiology as instructed.  Diet recommendation: Heart healthy/low-sodium diet and modified carbohydrates.  Filed Weights   04/25/18 0500 04/26/18 0528 04/27/18 0546  Weight: 88.1 kg (194 lb 3.6 oz) 90.4 kg (199 lb 4.7 oz) 90 kg (198 lb 6.6 oz)    Brief History of present illness:  82 y.o.female,w hx of hypothyroidism, dm2, hypertension, hyperlipidemia, renal artery stenosis, stroke, CAD, CHF (EF 20-25%), asthma, apparently presents with cough with green sputum and dyspnea for the past 2 days. Pt found with acute on chronic CHF and also CAP.  Hospital Course:  1-CAP: as seen on CT scan -continue abx's, now on PO augmentin  -no oxygen supplementation needed at discharge -patient afebrile and breathing  back to baseline  -repeat CXR in 6 to assure resolution of infiltrates.    2-A. Fib with RVR -new diagnosis -CHADsVASC score 7 -will continue adjusted dose of coreg as recommended by cardiology service -started on eliquis -continue monitoring on telemetry  -given paroxysmal etiology no cardioversion at this time   3-essential HTN -not at goal -Coreg and hydralazine adjusted for better control. -encourage continue low sodium diet   4-acute on chronic combined HF -follow daily weight and low sodium diet  -continue lasix at 60 mg daily starting 5/18 again -2D echo with EF 30% -no ACE or ARB with renal failure -already on b-blocker, hydralazine and Imdur -discussion with daughter at bedside regarding low sodium diet  5-elevated troponin in the setting of demand ischemia -continue medical management  -no CP currently. -per cardiology no further ischemic work up   6-acute on chronic renal failure: stage 3 at baseline  -minimize use of nephrotoxic agents -repeat BMET at follow up visit -Cr 1.8 (after diuresis) -no lasix for 24 hours; resume diuretics on 5/18 -advise to maintain adequate hydration.   7-type 2 diabetes with nephropathy -resume home hypoglycemic regimen  -Overall well controlled -A1C 6.6 -continue following CBG's and A1C as an outpatient and further adjust regimen as needed   8-hypothyroidism  -continue synthroid  -normal TSH  9-prior hx of stroke: ischemic/embolic in nature. -without major residual deficit  -now on eliquis for A. Fib and will provide secondary prevention. -no neurologic deficits appreciated.  10-HLD -continue lipitor.    Procedures:  See below for x-ray reports.  2-D echo: with diffuse hypokinesis, EF 24% and diastolic dysfunction grade 2.  Consultations:  Cardiology service  Discharge Exam: Vitals:   04/26/18 2125 04/27/18 2683  BP: (!) 139/96 (!) 143/82  Pulse: (!) 106 (!) 116  Resp:    Temp: 99 F (37.2 C)  99 F (37.2 C)  SpO2: 98% 90%     General: No fever, reports breathing good and at her baseline, no chest pain, no palpitations, no abdominal pain, no nausea vomiting.    Requiring oxygen supplementation.  Patient With good oxygen saturation at rest and on exertion.    Cardiovascular: Rate controlled, no JVD, no rubs, no gallops.  Respiratory: Decreased breath sounds at the bases, no wheezing, positive scattered rhonchi, no using accessory muscles; no frank crackles.  Abdomen: Soft, nontender, nondistended, positive bowel sounds  Musculoskeletal: Trace edema bilaterally, no cyanosis, no clubbing   Discharge Instructions   Discharge Instructions    (HEART FAILURE PATIENTS) Call MD:  Anytime you have any of the following symptoms: 1) 3 pound weight gain in 24 hours or 5 pounds in 1 week 2) shortness of breath, with or without a dry hacking cough 3) swelling in the hands, feet or stomach 4) if you have to sleep on extra pillows at night in order to breathe.   Complete by:  As directed    Diet - low sodium heart healthy   Complete by:  As directed    Discharge instructions   Complete by:  As directed    Take medications as prescribed Follow-up with cardiology service as instructed Follow low-sodium diet (less than 2 g of sodium on daily basis) Check your weight every day Resume Lasix on 04/28/2018 Complete antibiotics as instructed.     Allergies as of 04/27/2018      Reactions   Yellow Jacket Venom [bee Venom] Shortness Of Breath, Itching      Medication List    STOP taking these medications   isosorbide dinitrate 30 MG tablet Commonly known as:  ISORDIL     TAKE these medications   allopurinol 100 MG tablet Commonly known as:  ZYLOPRIM TAKE ONE TABLET BY MOUTH ONCE DAILY FOR GOUT   amoxicillin-clavulanate 500-125 MG tablet Commonly known as:  AUGMENTIN Take 1 tablet (500 mg total) by mouth 2 times daily at 12 noon and 4 pm for 4 days.   apixaban 5 MG Tabs  tablet Commonly known as:  ELIQUIS Take 1 tablet (5 mg total) by mouth 2 (two) times daily.   atorvastatin 20 MG tablet Commonly known as:  LIPITOR Take 1 tablet (20 mg total) by mouth daily at 6 PM.   carvedilol 12.5 MG tablet Commonly known as:  COREG Take 1 tablet (12.5 mg total) by mouth 2 (two) times daily with a meal. What changed:    medication strength  how much to take   colchicine 0.6 MG tablet TAKE ONE TABLET BY MOUTH ONCE DAILY FOR GOUT   diphenhydrAMINE 25 mg capsule Commonly known as:  BENADRYL Take 1 capsule (25 mg total) by mouth every 4 (four) hours as needed for itching (or hives).   donepezil 10 MG tablet Commonly known as:  ARICEPT Take 10 mg by mouth daily.   EPINEPHrine 0.3 mg/0.3 mL Soaj injection Commonly known as:  EPI-PEN Inject 0.3 mLs (0.3 mg total) into the muscle once as needed (severe allergic reaction).   famotidine 20 MG tablet Commonly known as:  PEPCID Take 1 tablet (20 mg total) by mouth daily.   furosemide 20 MG tablet Commonly known as:  LASIX Take 3 tablets (60 mg total) by mouth daily. Start taking on:  04/28/2018 What changed:  medication strength  how much to take  how to take this  when to take this  additional instructions   gabapentin 100 MG capsule Commonly known as:  NEURONTIN Take 100 mg by mouth 3 (three) times daily.   hydrALAZINE 50 MG tablet Commonly known as:  APRESOLINE Take 1.5 tablets (75 mg total) by mouth every 8 (eight) hours. What changed:    medication strength  how much to take  when to take this   isosorbide mononitrate 30 MG 24 hr tablet Commonly known as:  IMDUR Take 1 tablet (30 mg total) by mouth daily. Start taking on:  04/28/2018   pioglitazone 15 MG tablet Commonly known as:  ACTOS TAKE ONE TABLET BY MOUTH ONCE DAILY FOR DIABETES   sitaGLIPtin 50 MG tablet Commonly known as:  JANUVIA Take 50 mg by mouth daily.      Allergies  Allergen Reactions  . Yellow Jacket  Venom [Bee Venom] Shortness Of Breath and Itching   Follow-up Information    Jani Gravel, MD. Schedule an appointment as soon as possible for a visit in 10 day(s).   Specialty:  Internal Medicine Contact information: [address] [place] [address] [phone]        Satira Sark, MD .   Specialty:  Cardiology Contact information: [place] [address] [phone]           The results of significant diagnostics from this hospitalization (including imaging, microbiology, ancillary and laboratory) are listed below for reference.    Significant Diagnostic Studies: Dg Chest 2 View  Result Date: 04/24/2018 CLINICAL DATA:  Dry cough and shortness of breath today. EXAM: CHEST - 2 VIEW COMPARISON:  PA and lateral chest 10/01/2015 and 11/10/2014. FINDINGS: There is cardiomegaly and mild vascular congestion, unchanged since the most recent exam. Aortic atherosclerosis is noted. No pneumothorax or pleural fluid. No consolidative process. No acute bony abnormality. IMPRESSION: Chronic cardiomegaly and vascular congestion. No acute disease. Atherosclerosis. Electronically Signed   By: Inge Rise M.D.   On: 04/24/2018 17:31   Ct Chest Wo Contrast  Result Date: 04/24/2018 CLINICAL DATA:  Acute onset of productive cough. Decreased O2 saturation. EXAM: CT CHEST WITHOUT CONTRAST TECHNIQUE: Multidetector CT imaging of the chest was performed following the standard protocol without IV contrast. COMPARISON:  Chest radiograph performed earlier today at 5:08 p.m. FINDINGS: Cardiovascular: The heart is borderline normal in size. Scattered calcification is noted along the thoracic aorta and proximal great vessels. Scattered coronary artery calcifications are seen. Mediastinum/Nodes: Azygoesophageal recess nodes measure up to 1.8 cm in short axis. Mild nonspecific bilateral hilar prominence is noted. No pericardial effusion is identified. The thyroid gland  is unremarkable. No axillary lymphadenopathy is seen. Lungs/Pleura: Patchy airspace opacification at the superior aspect of the right lower lobe is concerning for pneumonia. Evaluation is suboptimal due to motion artifact. Underlying atelectasis is noted bilaterally. No pleural effusion or pneumothorax is seen. Underlying vascular congestion is seen. Upper Abdomen: The visualized portions of the liver and spleen are unremarkable. The patient is status post cholecystectomy, with clips noted at the gallbladder fossa. The visualized portions of the pancreas, adrenal glands and kidneys are within normal limits. Musculoskeletal: No acute osseous abnormalities are identified. The visualized musculature is unremarkable in appearance. IMPRESSION: 1. Patchy airspace opacification at the superior aspect of the right lower lobe is concerning for pneumonia. Mild nonspecific bilateral hilar prominence noted. After completion of treatment for pneumonia, would consider follow-up CT of the  chest to ensure resolution of hilar prominence and airspace opacification. 2. Underlying vascular congestion noted. 3. Azygoesophageal recess nodes measure up to 1.8 cm in short axis. This may reflect the underlying infection. 4. Scattered coronary artery calcifications seen. 5. Electronically Signed   By: Garald Balding M.D.   On: 04/24/2018 21:10   Nm Pulmonary Perf And Vent  Result Date: 04/25/2018 CLINICAL DATA:  82 year old female with acute shortness of breath and elevated D-dimer. EXAM: NUCLEAR MEDICINE VENTILATION - PERFUSION LUNG SCAN TECHNIQUE: Ventilation images were obtained in multiple projections using inhaled aerosol Tc-109m DTPA. Perfusion images were obtained in multiple projections after intravenous injection of Tc-70m-MAA. RADIOPHARMACEUTICALS:  31.4 mCi of Tc-78m DTPA aerosol inhalation and 4.39 mCi Tc45m-MAA IV COMPARISON:  04/24/2018 noncontrast chest CT FINDINGS: Ventilation: Mild patchy nonsegmental areas of decreased  ventilation noted. Perfusion: Mild patchy nonsegmental areas of decreased perfusion matched ventilation defects. No wedge shaped peripheral perfusion defects to suggest acute pulmonary embolism. IMPRESSION: Low probability for pulmonary embolus (less than 20%). Electronically Signed   By: Margarette Canada M.D.   On: 04/25/2018 12:47    Microbiology: Recent Results (from the past 240 hour(s))  MRSA PCR Screening     Status: None   Collection Time: 04/25/18 12:07 AM  Result Value Ref Range Status   MRSA by PCR NEGATIVE NEGATIVE Final    Comment:        The GeneXpert MRSA Assay (FDA approved for NASAL specimens only), is one component of a comprehensive MRSA colonization surveillance program. It is not intended to diagnose MRSA infection nor to guide or monitor treatment for MRSA infections. Performed at [facility], [address], [place], [place] [address]      Labs: Basic Metabolic Panel: Recent Labs  Lab 04/24/18 1642 04/25/18 0310 04/26/18 0809 04/27/18 0527  NA 142 141 141 143  K 3.4* 3.9 3.4* 3.6  CL 106 105 105 107  CO2 24 24 26 26   GLUCOSE 118* 322* 130* 121*  BUN 16 18 32* 35*  CREATININE 1.21* 1.41* 1.50* 1.89*  CALCIUM 9.6 9.3 9.5 9.5   Liver Function Tests: Recent Labs  Lab 04/25/18 0310  AST 22  ALT 11*  ALKPHOS 119  BILITOT 0.7  PROT 7.5  ALBUMIN 3.4*   CBC: Recent Labs  Lab 04/24/18 1642 04/25/18 0310  WBC 8.5 9.9  NEUTROABS 6.0  --   HGB 12.9 12.4  HCT 40.9 40.2  MCV 86.8 86.8  PLT 228 222   Cardiac Enzymes: Recent Labs  Lab 04/24/18 1642 04/24/18 2233 04/25/18 0310 04/25/18 0955  CKTOTAL  --  129  --   --   CKMB  --  5.3*  --   --   TROPONINI 0.03* 0.04* 0.03* 0.03*   BNP: BNP (last 3 results) Recent Labs    04/24/18 2047  BNP 671.0*    CBG: Recent Labs  Lab 04/26/18 1200 04/26/18 1638 04/26/18 2127 04/27/18 0731 04/27/18 1146  GLUCAP 162* 94 113* 131* 174*    Signed:  Barton Dubois MD.  [facility] 04/27/2018, 1:59 PM

## 2018-05-14 ENCOUNTER — Other Ambulatory Visit: Payer: Self-pay

## 2018-05-14 ENCOUNTER — Emergency Department (HOSPITAL_COMMUNITY): Payer: Medicare Other

## 2018-05-14 ENCOUNTER — Observation Stay (HOSPITAL_COMMUNITY)
Admission: EM | Admit: 2018-05-14 | Discharge: 2018-05-17 | Disposition: A | Discharge status: Home / Self Care | Payer: Medicare Other | Attending: Internal Medicine | Admitting: Internal Medicine

## 2018-05-14 ENCOUNTER — Encounter (HOSPITAL_COMMUNITY): Payer: Self-pay | Admitting: *Deleted

## 2018-05-14 DIAGNOSIS — I639 Cerebral infarction, unspecified: Secondary | ICD-10-CM | POA: Diagnosis not present

## 2018-05-14 DIAGNOSIS — I4891 Unspecified atrial fibrillation: Secondary | ICD-10-CM | POA: Diagnosis not present

## 2018-05-14 DIAGNOSIS — I110 Hypertensive heart disease with heart failure: Secondary | ICD-10-CM | POA: Insufficient documentation

## 2018-05-14 DIAGNOSIS — E785 Hyperlipidemia, unspecified: Secondary | ICD-10-CM | POA: Insufficient documentation

## 2018-05-14 DIAGNOSIS — R55 Syncope and collapse: Secondary | ICD-10-CM | POA: Diagnosis not present

## 2018-05-14 DIAGNOSIS — Z7901 Long term (current) use of anticoagulants: Secondary | ICD-10-CM | POA: Diagnosis not present

## 2018-05-14 DIAGNOSIS — E119 Type 2 diabetes mellitus without complications: Secondary | ICD-10-CM | POA: Diagnosis not present

## 2018-05-14 DIAGNOSIS — Z79899 Other long term (current) drug therapy: Secondary | ICD-10-CM | POA: Diagnosis not present

## 2018-05-14 DIAGNOSIS — Z7984 Long term (current) use of oral hypoglycemic drugs: Secondary | ICD-10-CM | POA: Insufficient documentation

## 2018-05-14 DIAGNOSIS — I5042 Chronic combined systolic (congestive) and diastolic (congestive) heart failure: Secondary | ICD-10-CM | POA: Insufficient documentation

## 2018-05-14 DIAGNOSIS — J45909 Unspecified asthma, uncomplicated: Secondary | ICD-10-CM | POA: Insufficient documentation

## 2018-05-14 DIAGNOSIS — I482 Chronic atrial fibrillation, unspecified: Secondary | ICD-10-CM

## 2018-05-14 DIAGNOSIS — E1122 Type 2 diabetes mellitus with diabetic chronic kidney disease: Secondary | ICD-10-CM | POA: Diagnosis not present

## 2018-05-14 DIAGNOSIS — E039 Hypothyroidism, unspecified: Secondary | ICD-10-CM | POA: Insufficient documentation

## 2018-05-14 DIAGNOSIS — E1169 Type 2 diabetes mellitus with other specified complication: Secondary | ICD-10-CM | POA: Diagnosis present

## 2018-05-14 DIAGNOSIS — N183 Chronic kidney disease, stage 3 unspecified: Secondary | ICD-10-CM

## 2018-05-14 DIAGNOSIS — R531 Weakness: Secondary | ICD-10-CM | POA: Diagnosis present

## 2018-05-14 DIAGNOSIS — I2510 Atherosclerotic heart disease of native coronary artery without angina pectoris: Secondary | ICD-10-CM | POA: Diagnosis not present

## 2018-05-14 DIAGNOSIS — I130 Hypertensive heart and chronic kidney disease with heart failure and stage 1 through stage 4 chronic kidney disease, or unspecified chronic kidney disease: Secondary | ICD-10-CM | POA: Insufficient documentation

## 2018-05-14 DIAGNOSIS — N179 Acute kidney failure, unspecified: Secondary | ICD-10-CM | POA: Insufficient documentation

## 2018-05-14 DIAGNOSIS — I10 Essential (primary) hypertension: Secondary | ICD-10-CM | POA: Diagnosis present

## 2018-05-14 DIAGNOSIS — I509 Heart failure, unspecified: Secondary | ICD-10-CM

## 2018-05-14 HISTORY — DX: Paroxysmal atrial fibrillation: I48.0

## 2018-05-14 LAB — CBC WITH DIFFERENTIAL/PLATELET
Basophils Absolute: 0 10*3/uL (ref 0.0–0.1)
Basophils Relative: 0 %
Eosinophils Absolute: 0.1 10*3/uL (ref 0.0–0.7)
Eosinophils Relative: 2 %
HCT: 36.4 % (ref 36.0–46.0)
Hemoglobin: 10.9 g/dL — ABNORMAL LOW (ref 12.0–15.0)
Lymphocytes Relative: 21 %
Lymphs Abs: 1.4 10*3/uL (ref 0.7–4.0)
MCH: 26.3 pg (ref 26.0–34.0)
MCHC: 29.9 g/dL — ABNORMAL LOW (ref 30.0–36.0)
MCV: 87.9 fL (ref 78.0–100.0)
Monocytes Absolute: 0.8 10*3/uL (ref 0.1–1.0)
Monocytes Relative: 11 %
Neutro Abs: 4.4 10*3/uL (ref 1.7–7.7)
Neutrophils Relative %: 66 %
Platelets: 213 10*3/uL (ref 150–400)
RBC: 4.14 MIL/uL (ref 3.87–5.11)
RDW: 14.5 % (ref 11.5–15.5)
WBC: 6.7 10*3/uL (ref 4.0–10.5)

## 2018-05-14 LAB — COMPREHENSIVE METABOLIC PANEL
ALT: 8 U/L — ABNORMAL LOW (ref 14–54)
AST: 12 U/L — ABNORMAL LOW (ref 15–41)
Albumin: 3.6 g/dL (ref 3.5–5.0)
Alkaline Phosphatase: 100 U/L (ref 38–126)
Anion gap: 9 (ref 5–15)
BUN: 31 mg/dL — ABNORMAL HIGH (ref 6–20)
CO2: 24 mmol/L (ref 22–32)
Calcium: 9.1 mg/dL (ref 8.9–10.3)
Chloride: 108 mmol/L (ref 101–111)
Creatinine, Ser: 2.3 mg/dL — ABNORMAL HIGH (ref 0.44–1.00)
GFR calc Af Amer: 22 mL/min — ABNORMAL LOW (ref >60)
GFR calc non Af Amer: 19 mL/min — ABNORMAL LOW (ref >60)
Glucose, Bld: 107 mg/dL — ABNORMAL HIGH (ref 65–99)
Potassium: 3.8 mmol/L (ref 3.5–5.1)
Sodium: 141 mmol/L (ref 135–145)
Total Bilirubin: 0.4 mg/dL (ref 0.3–1.2)
Total Protein: 6.9 g/dL (ref 6.5–8.1)

## 2018-05-14 LAB — TROPONIN I: Troponin I: <0.03 ng/mL (ref <0.03)

## 2018-05-14 LAB — BRAIN NATRIURETIC PEPTIDE: B Natriuretic Peptide: 328 pg/mL — ABNORMAL HIGH (ref 0.0–100.0)

## 2018-05-14 LAB — CBG MONITORING, ED: Glucose-Capillary: 108 mg/dL — ABNORMAL HIGH (ref 65–99)

## 2018-05-14 MED ORDER — INSULIN ASPART 100 UNIT/ML ~~LOC~~ SOLN
0.0000 [IU] | Freq: Three times a day (TID) | SUBCUTANEOUS | Status: DC
  Administered 2018-05-16 – 2018-05-17 (×2): 2 [IU] via SUBCUTANEOUS

## 2018-05-14 MED ORDER — LACTATED RINGERS IV SOLN
INTRAVENOUS | Status: AC
  Administered 2018-05-14: 22:00:00 via INTRAVENOUS

## 2018-05-14 MED ORDER — SODIUM CHLORIDE 0.9 % IV BOLUS: 500.0000 mL | Freq: Once | INTRAVENOUS | Status: DC

## 2018-05-14 MED ORDER — APIXABAN 5 MG PO TABS
5.0000 mg | ORAL_TABLET | Freq: Two times a day (BID) | ORAL | Status: DC
  Administered 2018-05-14 – 2018-05-17 (×6): 5 mg via ORAL
  Filled 2018-05-14 (×6): qty 1

## 2018-05-14 MED ORDER — ALLOPURINOL 100 MG PO TABS
100.0000 mg | ORAL_TABLET | Freq: Every day | ORAL | Status: DC
  Administered 2018-05-15 – 2018-05-17 (×3): 100 mg via ORAL
  Filled 2018-05-14 (×3): qty 1

## 2018-05-14 MED ORDER — ONDANSETRON HCL 4 MG/2ML IJ SOLN
4.0000 mg | Freq: Four times a day (QID) | INTRAMUSCULAR | Status: DC | PRN
  Filled 2018-05-14: qty 2

## 2018-05-14 MED ORDER — ATORVASTATIN CALCIUM 40 MG PO TABS
80.0000 mg | ORAL_TABLET | Freq: Every day | ORAL | Status: DC
  Administered 2018-05-14 – 2018-05-16 (×3): 80 mg via ORAL
  Filled 2018-05-14 (×3): qty 2

## 2018-05-14 MED ORDER — ACETAMINOPHEN 650 MG RE SUPP: 650.0000 mg | Freq: Four times a day (QID) | RECTAL | Status: DC | PRN

## 2018-05-14 MED ORDER — ONDANSETRON HCL 4 MG PO TABS: 4.0000 mg | ORAL_TABLET | Freq: Four times a day (QID) | ORAL | Status: DC | PRN

## 2018-05-14 MED ORDER — ACETAMINOPHEN 325 MG PO TABS: 650.0000 mg | ORAL_TABLET | Freq: Four times a day (QID) | ORAL | Status: DC | PRN

## 2018-05-14 MED ORDER — INSULIN ASPART 100 UNIT/ML ~~LOC~~ SOLN: 0.0000 [IU] | Freq: Every day | SUBCUTANEOUS | Status: DC

## 2018-05-14 NOTE — ED Triage Notes (Signed)
Patient went to sit outside approximately over an hour, patient was seen crying, shaking, eyes rolling back with weakness.  , after coming back into house.  No history of seizures.  Has recent increase in Lasix, and "blood thinner".  CBG 160 per EMS with shallow respirations.  Last seen normal at 1400

## 2018-05-14 NOTE — ED Provider Notes (Signed)
Emergency Department Provider Note   I have reviewed the triage vital signs and the nursing notes.   HISTORY  Chief Complaint Weakness   HPI [name] is a 82 y.o. female with multiple medical problems documented below who presents the emergency department today for an episode of altered mental status.  The history is mostly given by the granddaughter who witnessed the episode.  It sounds that the patient was recently admitted to hospital found to be A. fib RVR with new onset heart failure was sent home on blood pressure medication, Eliquis, Lasix.  She is been compliant with that with some decrease in her weight but his count is stabilized so she has some baseline shortness of breath which she felt today apparently when walking out to the porch.  However after walking in from the porch the patient sat down and had a few second episode of her eyes rolled back with just her head jerking back-and-forth.  No upper or lower extremity jerking.  When she came to she immediately went to sleep.  No urinary or fecal incontinence.  When she woke up she had difficulty speaking but no demonstrable focal weakness.  Her difficulty speaking is just that she is talking a bit slower than normal.  No dysarthria or word salad.  Patient does have a history of stroke which presented as facial droop.  Her blood sugar with EMS was apparently 160.  At this time patient still has some weak voice but no other neurologic issues.  No other surrounding chest pain, headache, vision changes or other associated symptoms. No other associated or modifying symptoms.    Past Medical History:  Diagnosis Date  . Asthma   . Bronchitis   . Carotid artery disease ([place])   . Coronary atherosclerosis of native coronary artery    Nonobstructive at cath 2005  . DM2 (diabetes mellitus, type 2) ([place])   . Essential hypertension, benign   . Gout   . History of stroke   . Hypothyroidism   . Influenza 2013  . Nonischemic  cardiomyopathy (HCC)    LVEF 20-25%  . Renal artery stenosis (HCC)    Bilateral  . Stroke [facility])     Patient Active Problem List   Diagnosis Date Noted  . Atrial fibrillation, chronic ([place]) 05/14/2018  . Atrial fibrillation with RVR ([place]) 04/24/2018  . Hypokalemia 04/24/2018  . Acute exacerbation of CHF (congestive heart failure) ([place]) 09/01/2015  . CHF (congestive heart failure) ([place]) 07/02/2015  . Elevated troponin I level 07/02/2015  . Right bundle branch block 07/02/2015  . Renal insufficiency 07/02/2015  . CAP (community acquired pneumonia) 04/25/2015  . Acute respiratory failure with hypoxia ([place]) 04/25/2015  . Acute renal injury ([place]) 04/25/2015  . Type 2 diabetes with nephropathy ([place]) 04/25/2015  . ARF (acute renal failure) ([place]) 06/18/2014  . Mitral regurgitation 06/09/2014  . Dyspnea 06/06/2014  . Asthma exacerbation 06/06/2014  . Acute on chronic combined systolic and diastolic CHF (congestive heart failure) ([place]) 06/06/2014  . Influenza 12/10/2012  . Type 2 diabetes mellitus with hyperlipidemia ([place]) 07/08/2010  . Essential hypertension 04/06/2010  . CORONARY ATHEROSCLEROSIS NATIVE CORONARY ARTERY 04/06/2010  . CARDIOMYOPATHY 03/29/2010  . CVA (cerebral vascular accident) ([place]) 03/29/2010    Past Surgical History:  Procedure Laterality Date  . ABDOMINAL HYSTERECTOMY      Current Outpatient Rx  . Order #: [id number] Class: Historical Med  . Order #: [id number] Class: Normal  . Order #: [id number] Class: Normal  . Order #: [id number] Class:  Normal  . Order #: [id number] Class: Print  . Order #: [id number] Class: Normal  . Order #: [id number] Class: Historical Med  . Order #: [id number] Class: Normal  . Order #: [id number] Class: Normal  . Order #: [id number] Class: Historical Med  . Order #: [id number] Class: Historical Med  . Order #: [id number] Class: Historical Med  . Order #: [id number] Class: Print    Allergies Yellow jacket venom [bee venom]  Family History  Problem  Relation Age of Onset  . Asthma Son     Social History Social History   Tobacco Use  . Smoking status: Never Smoker  . Smokeless tobacco: Never Used  Substance Use Topics  . Alcohol use: No    Alcohol/week: 0.0 oz  . Drug use: No    Review of Systems  All other systems negative except as documented in the HPI. All pertinent positives and negatives as reviewed in the HPI. ____________________________________________   PHYSICAL EXAM:  VITAL SIGNS: ED Triage Vitals  Enc Vitals Group     BP 05/14/18 1549 115/67     Pulse Rate 05/14/18 1549 71     Resp 05/14/18 1549 (!) 22     Temp 05/14/18 1549 98.1 F (36.7 C)     Temp Source 05/14/18 1549 Oral     SpO2 05/14/18 1549 94 %     Weight 05/14/18 1552 198 lb (89.8 kg)     Height 05/14/18 1552 5\' 3"  (1.6 m)    Constitutional: Alert and oriented. Well appearing and in no acute distress. Eyes: Conjunctivae are normal. PERRL. EOMI. Head: Atraumatic. Nose: No congestion/rhinnorhea. Mouth/Throat: Mucous membranes are moist.  Oropharynx non-erythematous. Neck: No stridor.  No meningeal signs.   Cardiovascular: Normal rate, regular rhythm. Good peripheral circulation. Grossly normal heart sounds.   Respiratory: tachypneic respiratory effort.  No retractions. Lungs CTAB. Gastrointestinal: Soft and nontender. No distention.  Musculoskeletal: No lower extremity tenderness nor edema. No gross deformities of extremities. Neurologic:  Normal speech and language. No gross focal neurologic deficits are appreciated. No cranial nerve deficits. Speaks slowly and quietly but no dysarthria or word salad noted. Sensation and motor intact in distal extremities. Oriented to person, place and family members but doesn't remember much around event.  Skin:  Skin is warm, dry and intact. No rash noted.   ____________________________________________   LABS (all labs ordered are listed, but only abnormal results are displayed)  Labs Reviewed  CBC  WITH DIFFERENTIAL/PLATELET - Abnormal; Notable for the following components:      Result Value   Hemoglobin 10.9 (*)    MCHC 29.9 (*)    All other components within normal limits  COMPREHENSIVE METABOLIC PANEL - Abnormal; Notable for the following components:   Glucose, Bld 107 (*)    BUN 31 (*)    Creatinine, Ser 2.30 (*)    AST 12 (*)    ALT 8 (*)    GFR calc non Af Amer 19 (*)    GFR calc Af Amer 22 (*)    All other components within normal limits  BRAIN NATRIURETIC PEPTIDE - Abnormal; Notable for the following components:   B Natriuretic Peptide 328.0 (*)    All other components within normal limits  CBG MONITORING, ED - Abnormal; Notable for the following components:   Glucose-Capillary 108 (*)    All other components within normal limits  TROPONIN I  BASIC METABOLIC PANEL  CBC   ____________________________________________  EKG   EKG Interpretation  Date/Time:  Monday May 14 2018 15:51:14 EDT Ventricular Rate:  71 PR Interval:    QRS Duration: 140 QT Interval:  463 QTC Calculation: 504 R Axis:   50 Text Interpretation:  Sinus rhythm Atrial premature complexes in couplets Borderline prolonged PR interval Right bundle branch block Confirmed by Milton Ferguson [phone]) on 05/14/2018 3:54:06 PM       ____________________________________________  RADIOLOGY  Dg Chest 2 View  Result Date: 05/14/2018 CLINICAL DATA:  Altered mental status. EXAM: CHEST - 2 VIEW COMPARISON:  Chest radiograph 04/24/2018 FINDINGS: Stable enlarged cardiac silhouette. Low lung volumes. No effusion, infiltrate pneumothorax. Chronic bronchitic markings noted. No acute osseous abnormality. IMPRESSION: Cardiomegaly without acute cardiopulmonary findings. Electronically Signed   By: Suzy Bouchard M.D.   On: 05/14/2018 18:58   Ct Head Wo Contrast  Result Date: 05/14/2018 CLINICAL DATA:  Altered mental status. EXAM: CT HEAD WITHOUT CONTRAST TECHNIQUE: Contiguous axial images were obtained from the  base of the skull through the vertex without intravenous contrast. COMPARISON:  CT head dated June 18, 2014. FINDINGS: Brain: New hypodensity within the left cerebellum. Old infarct within the left occipital lobe. Progressive periventricular white matter hypodensity along the right frontal horn likely due to chronic microvascular ischemic changes. Stable mild cerebral atrophy. No evidence of hemorrhage, hydrocephalus, extra-axial collection or mass lesion/mass effect. Vascular: Calcified atherosclerosis at the skullbase. No hyperdense vessel. Skull: Negative for fracture or focal lesion. Diffuse calvarial thickening is unchanged. Sinuses/Orbits: No acute finding. Other: None. IMPRESSION: 1. Findings concerning for acute left cerebellar infarct. These results were called by telephone on 05/14/2018 at 7:23 pm to Dr. Merrily Pew , who verbally acknowledged these results. Electronically Signed   By: Titus Dubin M.D.   On: 05/14/2018 19:25    ____________________________________________   PROCEDURES  Procedure(s) performed:   Procedures  Angiocath insertion Performed by: Merrily Pew  Consent: Verbal consent obtained. Risks and benefits: risks, benefits and alternatives were discussed Time out: Immediately prior to procedure a "time out" was called to verify the correct patient, procedure, equipment, support staff and site/side marked as required.  Preparation: Patient was prepped and draped in the usual sterile fashion.  Vein Location: right basilic  Ultrasound Guided  Gauge: 20  Normal blood return and flush without difficulty Patient tolerance: Patient tolerated the procedure well with no immediate complications.  CRITICAL CARE Performed by: Merrily Pew Total critical care time: 35 minutes Critical care time was exclusive of separately billable procedures and treating other patients. Critical care was necessary to treat or prevent imminent or life-threatening  deterioration. Critical care was time spent personally by me on the following activities: development of treatment plan with patient and/or surrogate as well as nursing, discussions with consultants, evaluation of patient's response to treatment, examination of patient, obtaining history from patient or surrogate, ordering and performing treatments and interventions, ordering and review of laboratory studies, ordering and review of radiographic studies, pulse oximetry and re-evaluation of patient's condition.   ____________________________________________   INITIAL IMPRESSION / ASSESSMENT AND PLAN / ED COURSE  Will workup for syncope vs seizure vs stroke. No indication for code stroke at this time as there is no obvious neuro deficit that I can localize and symptom of abnormal speech that is improving.   Workup with AKI. Also with acute to subacute infarct on CT as likely cause for her abnormal speech. Not tPA candidate 2/2 improving symptoms, unknown last well and is on eliquis. Will admit to hospitalist for further workup and management.      Pertinent labs & imaging results that were available  during my care of the patient were reviewed by me and considered in my medical decision making (see chart for details).  ____________________________________________  FINAL CLINICAL IMPRESSION(S) / ED DIAGNOSES  Final diagnoses:  Cerebrovascular accident (CVA), unspecified mechanism ([place])  AKI (acute kidney injury) ([place])     MEDICATIONS GIVEN DURING THIS VISIT:  Medications  allopurinol (ZYLOPRIM) tablet 100 mg (has no administration in time range)  apixaban (ELIQUIS) tablet 5 mg (has no administration in time range)  atorvastatin (LIPITOR) tablet 80 mg (has no administration in time range)  lactated ringers infusion (has no administration in time range)  acetaminophen (TYLENOL) tablet 650 mg (has no administration in time range)    Or  acetaminophen (TYLENOL) suppository 650 mg (has no  administration in time range)  ondansetron (ZOFRAN) tablet 4 mg (has no administration in time range)    Or  ondansetron (ZOFRAN) injection 4 mg (has no administration in time range)  insulin aspart (novoLOG) injection 0-15 Units (has no administration in time range)  insulin aspart (novoLOG) injection 0-5 Units (has no administration in time range)     NEW OUTPATIENT MEDICATIONS STARTED DURING THIS VISIT:  New Prescriptions   No medications on file    Note:  This note was prepared with assistance of Dragon voice recognition software. Occasional wrong-word or sound-a-like substitutions may have occurred due to the inherent limitations of voice recognition software.   Merrily Pew, MD 05/14/18 2102

## 2018-05-14 NOTE — ED Notes (Signed)
Pt given ginger ale and peanut butter crackers. 

## 2018-05-14 NOTE — H&P (Signed)
History and Physical    [name] RWE:[id number] DOB: 11-Apr-1936 DOA: 05/14/2018  PCP: Jani Gravel, MD   Patient coming from: Home  Chief Complaint: AMS/dysarthria  HPI: [name] is a 82 y.o. female with medical history significant for hypothyroidism, type 2 diabetes, hypertension, dyslipidemia, renal artery stenosis, prior CVA, CAD with ischemic cardiomyopathy LVEF 61%, chronic systolic and diastolic CHF, and asthma who was brought to the emergency department via EMS after granddaughter witnessed what appeared to be a syncopal episode earlier this afternoon at approximately 2 PM.  She states that the patient noted some shortness of breath when she was walking outside on the porch and within a few seconds of sitting down her eyes rolled back and her head kept jerking back-and-forth.  She then went to sleep and by the time she woke up she had some difficulty speaking and appeared to be confused.  EMS noted blood glucose of 160mg /dL on the scene.  No other symptoms were noted and there were no signs of any urinary or fecal incontinence.   ED Course: Vital signs are stable.  Laboratory data is remarkable for anemia with hemoglobin 10.9 with prior baseline of 12.4.  Additionally she is noted to have BUN of 31 and creatinine of 2.3 with prior baseline of 1.4-1.5.  CT of her head demonstrates findings of an acute left cerebellar CVA in 2 view chest x-ray demonstrates a mild cardiomegaly with no other acute findings.  Troponin is less than 0.03 and BNP is 328.  Review of Systems: All others reviewed and otherwise negative.  Past Medical History:  Diagnosis Date  . Asthma   . Bronchitis   . Carotid artery disease ([place])   . Coronary atherosclerosis of native coronary artery    Nonobstructive at cath 2005  . DM2 (diabetes mellitus, type 2) ([place])   . Essential hypertension, benign   . Gout   . History of stroke   . Hypothyroidism   . Influenza 2013  . Nonischemic cardiomyopathy (HCC)      LVEF 20-25%  . Renal artery stenosis (HCC)    Bilateral  . Stroke [facility])     Past Surgical History:  Procedure Laterality Date  . ABDOMINAL HYSTERECTOMY       reports that she has never smoked. She has never used smokeless tobacco. She reports that she does not drink alcohol or use drugs.  Allergies  Allergen Reactions  . Yellow Jacket Venom [Bee Venom] Shortness Of Breath and Itching    Family History  Problem Relation Age of Onset  . Asthma Son     Prior to Admission medications   Medication Sig Start Date End Date Taking? Authorizing Provider  apixaban (ELIQUIS) 5 MG TABS tablet Take 1 tablet (5 mg total) by mouth 2 (two) times daily. 04/27/18  Yes Barton Dubois, MD  EPINEPHrine 0.3 mg/0.3 mL IJ SOAJ injection Inject 0.3 mLs (0.3 mg total) into the muscle once as needed (severe allergic reaction). 09/26/17  Yes Forde Dandy, MD  allopurinol (ZYLOPRIM) 100 MG tablet TAKE ONE TABLET BY MOUTH ONCE DAILY FOR GOUT 07/01/17   [provider]  atorvastatin (LIPITOR) 20 MG tablet Take 1 tablet (20 mg total) by mouth daily at 6 PM. 04/27/18   Barton Dubois, MD  carvedilol (COREG) 12.5 MG tablet Take 1 tablet (12.5 mg total) by mouth 2 (two) times daily with a meal. 04/27/18   Barton Dubois, MD  colchicine 0.6 MG tablet TAKE ONE TABLET BY MOUTH ONCE DAILY  FOR GOUT 08/29/17   [provider]  diphenhydrAMINE (BENADRYL) 25 mg capsule Take 1 capsule (25 mg total) by mouth every 4 (four) hours as needed for itching (or hives). 09/26/17   Forde Dandy, MD  donepezil (ARICEPT) 10 MG tablet Take 10 mg by mouth daily.    [provider]  famotidine (PEPCID) 20 MG tablet Take 1 tablet (20 mg total) by mouth daily. 04/27/18   Barton Dubois, MD  furosemide (LASIX) 20 MG tablet Take 3 tablets (60 mg total) by mouth daily. 04/28/18   Barton Dubois, MD  gabapentin (NEURONTIN) 100 MG capsule Take 100 mg by mouth 3 (three) times daily.     [provider]   hydrALAZINE (APRESOLINE) 50 MG tablet Take 1.5 tablets (75 mg total) by mouth every 8 (eight) hours. 04/27/18   Barton Dubois, MD  isosorbide mononitrate (IMDUR) 30 MG 24 hr tablet Take 1 tablet (30 mg total) by mouth daily. 04/28/18   Barton Dubois, MD  pioglitazone (ACTOS) 15 MG tablet TAKE ONE TABLET BY MOUTH ONCE DAILY FOR DIABETES 08/12/17   [provider]  sitaGLIPtin (JANUVIA) 50 MG tablet Take 50 mg by mouth daily.    [provider]    Physical Exam: Vitals:   05/14/18 1630 05/14/18 1800 05/14/18 1900 05/14/18 1930  BP: 108/67 135/65 132/77 (!) 145/74  Pulse: 67  68 78  Resp: (!) 30 20 20 17   Temp:      TempSrc:      SpO2: 100%  100% 100%  Weight:      Height:        Constitutional: NAD, calm, comfortable; alert; mildly confused. Vitals:   05/14/18 1630 05/14/18 1800 05/14/18 1900 05/14/18 1930  BP: 108/67 135/65 132/77 (!) 145/74  Pulse: 67  68 78  Resp: (!) 30 20 20 17   Temp:      TempSrc:      SpO2: 100%  100% 100%  Weight:      Height:       Eyes: lids and conjunctivae normal ENMT: Mucous membranes are moist.  Neck: normal, supple Respiratory: clear to auscultation bilaterally. Normal respiratory effort. No accessory muscle use. On 2L [place]. Cardiovascular: irregular rate and rhythm, no murmurs. No extremity edema. Abdomen: no tenderness, no distention. Bowel sounds positive.  Musculoskeletal:  No joint deformity upper and lower extremities.   Skin: no rashes, lesions, ulcers.   Labs on Admission: I have personally reviewed following labs and imaging studies  CBC: Recent Labs  Lab 05/14/18 1659  WBC 6.7  NEUTROABS 4.4  HGB 10.9*  HCT 36.4  MCV 87.9  PLT 546   Basic Metabolic Panel: Recent Labs  Lab 05/14/18 1659  NA 141  K 3.8  CL 108  CO2 24  GLUCOSE 107*  BUN 31*  CREATININE 2.30*  CALCIUM 9.1   GFR: Estimated Creatinine Clearance: 21.1 mL/min (A) (by C-G formula based on SCr of 2.3 mg/dL (H)). Liver Function  Tests: Recent Labs  Lab 05/14/18 1659  AST 12*  ALT 8*  ALKPHOS 100  BILITOT 0.4  PROT 6.9  ALBUMIN 3.6   No results for input(s): LIPASE, AMYLASE in the last 168 hours. No results for input(s): AMMONIA in the last 168 hours. Coagulation Profile: No results for input(s): INR, PROTIME in the last 168 hours. Cardiac Enzymes: Recent Labs  Lab 05/14/18 1659  TROPONINI <0.03   BNP (last 3 results) No results for input(s): PROBNP in the last 8760 hours. HbA1C: No results  for input(s): HGBA1C in the last 72 hours. CBG: Recent Labs  Lab 05/14/18 1701  GLUCAP 108*   Lipid Profile: No results for input(s): CHOL, HDL, LDLCALC, TRIG, CHOLHDL, LDLDIRECT in the last 72 hours. Thyroid Function Tests: No results for input(s): TSH, T4TOTAL, FREET4, T3FREE, THYROIDAB in the last 72 hours. Anemia Panel: No results for input(s): VITAMINB12, FOLATE, FERRITIN, TIBC, IRON, RETICCTPCT in the last 72 hours. Urine analysis:    Component Value Date/Time   COLORURINE YELLOW 07/09/2014 0553   APPEARANCEUR CLEAR 07/09/2014 0553   LABSPEC 1.015 07/09/2014 0553   PHURINE 5.5 07/09/2014 0553   GLUCOSEU NEGATIVE 07/09/2014 0553   HGBUR NEGATIVE 07/09/2014 0553   BILIRUBINUR NEGATIVE 07/09/2014 0553   KETONESUR TRACE (A) 07/09/2014 0553   PROTEINUR TRACE (A) 07/09/2014 0553   UROBILINOGEN 0.2 07/09/2014 0553   NITRITE POSITIVE (A) 07/09/2014 0553   LEUKOCYTESUR NEGATIVE 07/09/2014 0553    Radiological Exams on Admission: Dg Chest 2 View  Result Date: 05/14/2018 CLINICAL DATA:  Altered mental status. EXAM: CHEST - 2 VIEW COMPARISON:  Chest radiograph 04/24/2018 FINDINGS: Stable enlarged cardiac silhouette. Low lung volumes. No effusion, infiltrate pneumothorax. Chronic bronchitic markings noted. No acute osseous abnormality. IMPRESSION: Cardiomegaly without acute cardiopulmonary findings. Electronically Signed   By: Suzy Bouchard M.D.   On: 05/14/2018 18:58   Ct Head Wo Contrast  Result  Date: 05/14/2018 CLINICAL DATA:  Altered mental status. EXAM: CT HEAD WITHOUT CONTRAST TECHNIQUE: Contiguous axial images were obtained from the base of the skull through the vertex without intravenous contrast. COMPARISON:  CT head dated June 18, 2014. FINDINGS: Brain: New hypodensity within the left cerebellum. Old infarct within the left occipital lobe. Progressive periventricular white matter hypodensity along the right frontal horn likely due to chronic microvascular ischemic changes. Stable mild cerebral atrophy. No evidence of hemorrhage, hydrocephalus, extra-axial collection or mass lesion/mass effect. Vascular: Calcified atherosclerosis at the skullbase. No hyperdense vessel. Skull: Negative for fracture or focal lesion. Diffuse calvarial thickening is unchanged. Sinuses/Orbits: No acute finding. Other: None. IMPRESSION: 1. Findings concerning for acute left cerebellar infarct. These results were called by telephone on 05/14/2018 at 7:23 pm to Dr. Merrily Pew , who verbally acknowledged these results. Electronically Signed   By: Titus Dubin M.D.   On: 05/14/2018 19:25    EKG: Independently reviewed. 71bpm appears to be afib.  Assessment/Plan Principal Problem:   CVA (cerebral vascular accident) ([place]) Active Problems:   Type 2 diabetes mellitus with hyperlipidemia ([place])   Essential hypertension   ARF (acute renal failure) (HCC)   CHF (congestive heart failure) (HCC)   Atrial fibrillation, chronic ([place])    1. Acute-subacute left cerebellar CVA.  We will continue to monitor while on telemetry with neurochecks and brain MRI in a.m. Carotid ultrasounds bilaterally.  I will not repeat 2D echocardiogram as this was recently performed on 04/2018.  PT/OT evaluation.  Neurology consultation.  Continue Eliquis for now and add aspirin 81 mg daily for now.  Atorvastatin to 80 mg.  TSH, hemoglobin A1c, and fasting lipid panel recently evaluated on 04/2018.  She has currently passed bedside swallow  evaluation. 2. AKI on CKD III. Hold nephrotoxic agents and place on time-limited IVF with repeat renal panel in am. 3. Anemia. Mostly stable with no overt bleeding. Recheck am CBC. 4. Type 2 diabetes.  Hold home oral agents and place on sliding scale insulin. 5. Hypertension.  Allow permissive hypertension up to 220/110 and hold home oral agents. Currently with soft Bps. 6. Chronic diastolic  and systolic CHF.  Appears to be at baseline.  Continue to monitor I's and O's as well as daily weights. 7. Chronic atrial fibrillation.  Currently rate controlled.  Monitor on telemetry and continue Eliquis. 8. Gout.  Continue allopurinol.   DVT prophylaxis: Eliquis Code Status: Full Family Communication: Son at bedside Disposition Plan:CVA evaluation Consults called:Neurology in computer Admission status: Obs, Tele   Pratik D [facility] Pager [phone]  If 7PM-7AM, please contact night-coverage [email or web address] Password [email or web address]  05/14/2018, 8:10 PM

## 2018-05-14 NOTE — ED Notes (Signed)
Pt denies weakness and dizziness at this time

## 2018-05-15 ENCOUNTER — Observation Stay (HOSPITAL_COMMUNITY): Payer: Medicare Other

## 2018-05-15 DIAGNOSIS — N183 Chronic kidney disease, stage 3 (moderate): Secondary | ICD-10-CM | POA: Diagnosis not present

## 2018-05-15 DIAGNOSIS — E785 Hyperlipidemia, unspecified: Secondary | ICD-10-CM | POA: Diagnosis not present

## 2018-05-15 DIAGNOSIS — N179 Acute kidney failure, unspecified: Secondary | ICD-10-CM | POA: Diagnosis not present

## 2018-05-15 DIAGNOSIS — R55 Syncope and collapse: Secondary | ICD-10-CM | POA: Diagnosis not present

## 2018-05-15 DIAGNOSIS — I5042 Chronic combined systolic (congestive) and diastolic (congestive) heart failure: Secondary | ICD-10-CM | POA: Diagnosis not present

## 2018-05-15 DIAGNOSIS — I639 Cerebral infarction, unspecified: Secondary | ICD-10-CM | POA: Diagnosis not present

## 2018-05-15 DIAGNOSIS — I482 Chronic atrial fibrillation: Secondary | ICD-10-CM | POA: Diagnosis not present

## 2018-05-15 DIAGNOSIS — E1169 Type 2 diabetes mellitus with other specified complication: Secondary | ICD-10-CM | POA: Diagnosis not present

## 2018-05-15 DIAGNOSIS — I10 Essential (primary) hypertension: Secondary | ICD-10-CM | POA: Diagnosis not present

## 2018-05-15 LAB — BASIC METABOLIC PANEL
Anion gap: 7 (ref 5–15)
BUN: 34 mg/dL — ABNORMAL HIGH (ref 6–20)
CO2: 28 mmol/L (ref 22–32)
Calcium: 9 mg/dL (ref 8.9–10.3)
Chloride: 110 mmol/L (ref 101–111)
Creatinine, Ser: 2.17 mg/dL — ABNORMAL HIGH (ref 0.44–1.00)
GFR calc Af Amer: 24 mL/min — ABNORMAL LOW (ref >60)
GFR calc non Af Amer: 20 mL/min — ABNORMAL LOW (ref >60)
Glucose, Bld: 110 mg/dL — ABNORMAL HIGH (ref 65–99)
Potassium: 3.8 mmol/L (ref 3.5–5.1)
Sodium: 145 mmol/L (ref 135–145)

## 2018-05-15 LAB — CBC
HCT: 37.1 % (ref 36.0–46.0)
Hemoglobin: 10.9 g/dL — ABNORMAL LOW (ref 12.0–15.0)
MCH: 26.3 pg (ref 26.0–34.0)
MCHC: 29.4 g/dL — ABNORMAL LOW (ref 30.0–36.0)
MCV: 89.6 fL (ref 78.0–100.0)
Platelets: 198 10*3/uL (ref 150–400)
RBC: 4.14 MIL/uL (ref 3.87–5.11)
RDW: 14.6 % (ref 11.5–15.5)
WBC: 5.8 10*3/uL (ref 4.0–10.5)

## 2018-05-15 LAB — GLUCOSE, CAPILLARY
Glucose-Capillary: 98 mg/dL (ref 65–99)
Glucose-Capillary: 115 mg/dL — ABNORMAL HIGH (ref 65–99)

## 2018-05-15 NOTE — Progress Notes (Signed)
Attempted IV placement x 2. Pt refused any more sticks. Pt stated she will be going home today.

## 2018-05-15 NOTE — Care Management Obs Status (Signed)
[place] NOTIFICATION   Patient Details  Name: [name] MRN: [id number] Date of Birth: [birth year]   Medicare Observation Status Notification Given:  Yes    Sherald Barge, RN 05/15/2018, 12:18 PM

## 2018-05-15 NOTE — Care Management Note (Signed)
Case Management Note  Patient Details  Name: [name] MRN: [id number] Date of Birth: [birth year]  Subjective/Objective:     Admitted with CVA. Pt from home, lives with son, daughter provides assistance as needed. Pt has aid during the day. She has 24/7 supervision. Pt confused, tells this CM that she does not have aid, CM has verified with granddaughter that pt does still have the aid. Pt seen last month and more details are in CM note from that time.                 Action/Plan: PT and OT recommends no f/u. Pt plans to return home with resumption of previous care giving arrangements.   Expected Discharge Date:  05/15/18               Expected Discharge Plan:  Home/Self Care  In-House Referral:  NA  Discharge planning Services  CM Consult  Post Acute Care Choice:  NA Choice offered to:  NA  Status of Service:  Completed, signed off  Sherald Barge, RN 05/15/2018, 2:55 PM

## 2018-05-15 NOTE — Evaluation (Signed)
Occupational Therapy Evaluation Patient Details Name: [name] MRN: [id number] DOB: 1936/07/11 Today's Date: 05/15/2018    History of Present Illness [name] is a 82 y.o. female with medical history significant for hypothyroidism, type 2 diabetes, hypertension, dyslipidemia, renal artery stenosis, prior CVA, CAD with ischemic cardiomyopathy LVEF 35%, chronic systolic and diastolic CHF, and asthma who was brought to the emergency department via EMS after granddaughter witnessed what appeared to be a syncopal episode earlier this afternoon at approximately 2 PM.  She states that the patient noted some shortness of breath when she was walking outside on the porch and within a few seconds of sitting down her eyes rolled back and her head kept jerking back-and-forth.  She then went to sleep and by the time she woke up she had some difficulty speaking and appeared to be confused.  EMS noted blood glucose of 160mg /dL on the scene.  No other symptoms were noted and there were no signs of any urinary or fecal incontinence.   Clinical Impression   Pt received supine in bed, agreeable to OT evaluation, son present. Pt reports feeling back to her baseline this am. Pt requires assistance with LB dressing due to limited ability to reach feet, reports her granddaughter assists her when putting socks on at home. BUE strength is WFL, coordination and sensation are intact, no difficulty preparing and eating breakfast this am. Pt appears to be at baseline with ADL completion, no further OT services required at this time.     Follow Up Recommendations  No OT follow up;Supervision/Assistance - 24 hour    Equipment Recommendations  None recommended by OT       Precautions / Restrictions Precautions Precautions: Fall Restrictions Weight Bearing Restrictions: No      Mobility Bed Mobility Overal bed mobility: Modified Independent Bed Mobility: Supine to Sit     Supine to sit: Supervision      General bed mobility comments: increased time  Transfers Overall transfer level: Needs assistance Equipment used: None Transfers: Sit to/from Stand Sit to Stand: Min guard                  ADL either performed or assessed with clinical judgement   ADL Overall ADL's : Needs assistance/impaired Eating/Feeding: Modified independent;Sitting               Upper Body Dressing : Modified independent;Sitting   Lower Body Dressing: Maximal assistance;Sitting/lateral leans Lower Body Dressing Details (indicate cue type and reason): pt unable to bend over and donn socks                     Vision Baseline Vision/History: No visual deficits Patient Visual Report: No change from baseline Vision Assessment?: No apparent visual deficits            Pertinent Vitals/Pain Pain Assessment: No/denies pain     Hand Dominance Right   Extremity/Trunk Assessment Upper Extremity Assessment Upper Extremity Assessment: Overall WFL for tasks assessed(BUE grossly 4/5)   Lower Extremity Assessment Lower Extremity Assessment: Defer to PT evaluation   Cervical / Trunk Assessment Cervical / Trunk Assessment: Normal   Communication Communication Communication: No difficulties   Cognition Arousal/Alertness: Awake/alert Behavior During Therapy: WFL for tasks assessed/performed Overall Cognitive Status: Within Functional Limits for tasks assessed  Home Living Family/patient expects to be discharged to:: Private residence Living Arrangements: Children(son) Available Help at Discharge: Family;Available 24 hours/day Type of Home: House Home Access: Stairs to enter [employer] of Steps: 2   Home Layout: One level     Bathroom Shower/Tub: Teacher, early years/pre: Standard     Home Equipment: Environmental consultant - 2 wheels;Cane - single point;Bedside commode;Shower seat;Grab bars - tub/shower           Prior Functioning/Environment Level of Independence: Needs assistance  Gait / Transfers Assistance Needed: Uses RW occasionally, mostly when outside or in community ADL's / Homemaking Assistance Needed: assistance for donning socks             OT Problem List: Decreased activity tolerance    AM-PAC PT "6 Clicks" Daily Activity     Outcome Measure Help from another person eating meals?: None Help from another person taking care of personal grooming?: None Help from another person toileting, which includes using toliet, bedpan, or urinal?: None Help from another person bathing (including washing, rinsing, drying)?: None Help from another person to put on and taking off regular upper body clothing?: None Help from another person to put on and taking off regular lower body clothing?: A Little 6 Click Score: 23   End of Session    Activity Tolerance: Patient tolerated treatment well Patient left: in bed;with call bell/phone within reach;with bed alarm set;with family/visitor present  OT Visit Diagnosis: Muscle weakness (generalized) (M62.81)                Time: 5409-8119 OT Time Calculation (min): 19 min Charges:  OT General Charges $OT Visit: 1 Visit OT Evaluation $OT Eval Low Complexity: Isabela, OTR/L  [phone] 05/15/2018, 9:08 AM

## 2018-05-15 NOTE — Progress Notes (Signed)
Notified Dr. Dyann Kief about the pt not having an IV and not wanting to be stuck for one again. He said, since she does not need it for anything it was alright to leave it out.

## 2018-05-15 NOTE — Evaluation (Signed)
Physical Therapy Evaluation Patient Details Name: [name] MRN: [id number] DOB: [birth year] Today's Date: 05/15/2018   History of Present Illness  [name] is a 82 y.o. female with medical history significant for hypothyroidism, type 2 diabetes, hypertension, dyslipidemia, renal artery stenosis, prior CVA, CAD with ischemic cardiomyopathy LVEF 66%, chronic systolic and diastolic CHF, and asthma who was brought to the emergency department via EMS after granddaughter witnessed what appeared to be a syncopal episode earlier this afternoon at approximately 2 PM.  She states that the patient noted some shortness of breath when she was walking outside on the porch and within a few seconds of sitting down her eyes rolled back and her head kept jerking back-and-forth.  She then went to sleep and by the time she woke up she had some difficulty speaking and appeared to be confused.  EMS noted blood glucose of 160mg /dL on the scene.  No other symptoms were noted and there were no signs of any urinary or fecal incontinence.    Clinical Impression  Patient presents seated at bedside and  functioning near baseline for functional mobility and gait other than slower than normal cadence.  Patient able to ambulate in room and hallways without loss of balance.  Patient continued sitting up at bedside with her son present in room after therapy.  Plan:  Patient discharged from physical therapy to care of nursing for ambulation daily as tolerated for length of stay.    Follow Up Recommendations No PT follow up    Equipment Recommendations  None recommended by PT    Recommendations for Other Services       Precautions / Restrictions Precautions Precautions: None Restrictions Weight Bearing Restrictions: No      Mobility  Bed Mobility Overal bed mobility: Modified Independent Bed Mobility: Supine to Sit;Sit to Supine           General bed mobility comments: uses siderail to help pull herself  to sitting  Transfers Overall transfer level: Modified independent Equipment used: None Transfers: Sit to/from [facility] Sit to Stand: Modified independent (Device/Increase time) Stand pivot transfers: Modified independent (Device/Increase time)       General transfer comment: increased time  Ambulation/Gait Ambulation/Gait assistance: Modified independent (Device/Increase time) Ambulation Distance (Feet): 100 Feet Assistive device: None Gait Pattern/deviations: WFL(Within Functional Limits) Gait velocity: decreased   General Gait Details: grossly WFL except slower than normal cadence, no loss of balance or leaning on nearby objects  Stairs            Wheelchair Mobility    Modified Rankin (Stroke Patients Only)       Balance Overall balance assessment: No apparent balance deficits (not formally assessed)                                           Pertinent Vitals/Pain Pain Assessment: No/denies pain    Home Living Family/patient expects to be discharged to:: Private residence Living Arrangements: Children Available Help at Discharge: Family;Available 24 hours/day Type of Home: House Home Access: Stairs to enter Entrance Stairs-Rails: None Entrance Stairs-Number of Steps: 2 Home Layout: One level Home Equipment: Walker - 2 wheels;Cane - single point;Bedside commode;Shower seat;Grab bars - tub/shower      Prior Function Level of Independence: Independent with assistive device(s)   Gait / Transfers Assistance Needed: Household and short distanced ambulator with RW PRN  Hand Dominance   Dominant Hand: Right    Extremity/Trunk Assessment   Upper Extremity Assessment Upper Extremity Assessment: Defer to OT evaluation    Lower Extremity Assessment Lower Extremity Assessment: Overall WFL for tasks assessed    Cervical / Trunk Assessment Cervical / Trunk Assessment: Normal  Communication    Communication: No difficulties  Cognition Arousal/Alertness: Awake/alert Behavior During Therapy: WFL for tasks assessed/performed Overall Cognitive Status: Within Functional Limits for tasks assessed                                        General Comments      Exercises     Assessment/Plan    PT Assessment Patent does not need any further PT services  PT Problem List         PT Treatment Interventions      PT Goals (Current goals can be found in the Care Plan section)  Acute Rehab PT Goals Patient Stated Goal: return home PT Goal Formulation: With patient/family Time For Goal Achievement: 2018/06/08 Potential to Achieve Goals: Good    Frequency     Barriers to discharge        Co-evaluation               AM-PAC PT "6 Clicks" Daily Activity  Outcome Measure Difficulty turning over in bed (including adjusting bedclothes, sheets and blankets)?: None Difficulty moving from lying on back to sitting on the side of the bed? : None Difficulty sitting down on and standing up from a chair with arms (e.g., wheelchair, bedside commode, etc,.)?: None Help needed moving to and from a bed to chair (including a wheelchair)?: None Help needed walking in hospital room?: None Help needed climbing 3-5 steps with a railing? : None 6 Click Score: 24    End of Session   Activity Tolerance: Patient tolerated treatment well Patient left: in chair;with call bell/phone within reach;with family/visitor present Nurse Communication: Mobility status PT Visit Diagnosis: Unsteadiness on feet (R26.81);Other abnormalities of gait and mobility (R26.89);Muscle weakness (generalized) (M62.81)    Time: 9924-2683 PT Time Calculation (min) (ACUTE ONLY): 23 min   Charges:   PT Evaluation $PT Eval Moderate Complexity: 1 Mod PT Treatments $Therapeutic Activity: 23-37 mins   PT G Codes:        2:01 PM, 08-Jun-2018 Lonell Grandchild, MPT Physical Therapist with [facility] [phone] office [phone] mobile phone

## 2018-05-15 NOTE — Progress Notes (Signed)
[facility] PROGRESS NOTE  [name] AXE:[id number] DOB: Dec 07, 1936 DOA: 05/14/2018 PCP: Jani Gravel, MD  Interim summary and history of present illness 82 y.o. female with medical history significant for hypothyroidism, type 2 diabetes, hypertension, dyslipidemia, renal artery stenosis, prior CVA, CAD with ischemic cardiomyopathy LVEF 11%, chronic systolic and diastolic CHF, and asthma who was brought to the emergency department via EMS after granddaughter witnessed what appeared to be a syncopal episode earlier this afternoon at approximately 2 PM.  She states that the patient noted some shortness of breath when she was walking outside on the porch and within a few seconds of sitting down her eyes rolled back and her head kept jerking back-and-forth.  She then went to sleep and by the time she woke up she had some difficulty speaking and appeared to be confused.  EMS noted blood glucose of 160mg /dL on the scene.  No other symptoms were noted and there were no signs of any urinary or fecal incontinence.  Assessment/Plan: 1-acute ischemic stroke -Continue apixaban and aspirin for now -Follow neurology recommendation -Follow PT/OT recommendations. -Patient very close to her baseline; no motor deficit appreciated.  2-essential hypertension -Continue current antihypertensive regimen -Will be permissive in the setting of acute ischemic stroke.  3-A. fib: Chronic -ChadsVASC score 7 -We will continue Eliquis -Continue beta-blocker for rate control.  4-chronic combined diastolic and systolic heart failure -Recent ejection fraction at 30% -Unable to use ACE or ARB with renal failure at this moment -Holding Lasix given a slightly elevation in her creatinine comparison to prior visits -Continue beta-blocker, hydralazine and imdur  5-acute on chronic renal failure stage III at baseline -Continue holding nephrotoxic agents -Gentle diuresis was provided -Patient advised to keep yourself  well-hydrated -Creatinine trending down and currently 2.17 -Her most recent admission creatinine was 1.8.  6-type 2 diabetes with nephropathy -A1c 6.6 -Follow CBGs and continue current hypoglycemic regimen.  7-hypothyroidism -Continue Synthroid -Normal TSH.  8-hyperlipidemia  -continue Lipitor  9-history of underlying dementia -Appears to be at baseline mentation wise -No behavioral disturbances appreciated -Will continue Aricept.  Code Status: Full code Family Communication: No family at bedside Disposition Plan: Extra work-up almost completed; waiting neurology evaluation and recommendations.  Most likely home in a.m.   Consultants:  Neurology  Procedures:  See below for x-ray reports  Carotid duplex: Demonstrating 50% right and left internal carotid artery occlusion.  Vertebral flow antegrade.  Antibiotics:  None  HPI/Subjective: No acute distress, denies chest pain, shortness of breath, nausea, vomiting and abdominal pain.  Good oxygen saturation on room air.  Overall feeling close to her baseline.  Objective: Vitals:   05/14/18 2306 05/15/18 1359  BP: 130/64 (!) 147/83  Pulse: 66 71  Resp: (!) 21 (!) 21  Temp: 97.6 F (36.4 C) (!) 97.5 F (36.4 C)  SpO2: 100% 100%    Intake/Output Summary (Last 24 hours) at 05/15/2018 1833 Last data filed at 05/15/2018 0535 Gross per 24 hour  Intake 591.25 ml  Output -  Net 591.25 ml   Filed Weights   05/14/18 1552 05/15/18 0736  Weight: 89.8 kg (198 lb) 87.9 kg (193 lb 12.6 oz)    Exam:  General exam: Alert, awake, oriented x 2; in no acute distress.  Patient denies any chest pain or shortness of breath. Overall, Feeling very close to her baseline. Respiratory system: Clear to auscultation. Respiratory effort normal. Cardiovascular system: Rate controlled, no rubs, no gallops, no JVD.   Gastrointestinal system: Abdomen is nondistended, soft and  nontender. No organomegaly or masses felt. Normal bowel sounds  heard. Central nervous system: Alert and oriented x2. No focal motor deficits appreciated; negative pronator drift.  Patient able to follow simple commands.  Positive dysmetria.. Extremities: No edema, no cyanosis, no clubbing. Skin: No rashes, lesions or ulcers Psychiatry: Judgement and insight appear normal. Mood & affect appropriate.    Data Reviewed: Basic Metabolic Panel: Recent Labs  Lab 05/14/18 1659 05/15/18 0602  NA 141 145  K 3.8 3.8  CL 108 110  CO2 24 28  GLUCOSE 107* 110*  BUN 31* 34*  CREATININE 2.30* 2.17*  CALCIUM 9.1 9.0   Liver Function Tests: Recent Labs  Lab 05/14/18 1659  AST 12*  ALT 8*  ALKPHOS 100  BILITOT 0.4  PROT 6.9  ALBUMIN 3.6   CBC: Recent Labs  Lab 05/14/18 1659 05/15/18 0602  WBC 6.7 5.8  NEUTROABS 4.4  --   HGB 10.9* 10.9*  HCT 36.4 37.1  MCV 87.9 89.6  PLT 213 198   Cardiac Enzymes: Recent Labs  Lab 05/14/18 1659  TROPONINI <0.03   BNP (last 3 results) Recent Labs    04/24/18 2047 05/14/18 1659  BNP 671.0* 328.0*   CBG: Recent Labs  Lab 05/14/18 1701 05/15/18 0847 05/15/18 1730  GLUCAP 108* 98 115*   Studies: Dg Chest 2 View  Result Date: 05/14/2018 CLINICAL DATA:  Altered mental status. EXAM: CHEST - 2 VIEW COMPARISON:  Chest radiograph 04/24/2018 FINDINGS: Stable enlarged cardiac silhouette. Low lung volumes. No effusion, infiltrate pneumothorax. Chronic bronchitic markings noted. No acute osseous abnormality. IMPRESSION: Cardiomegaly without acute cardiopulmonary findings. Electronically Signed   By: Suzy Bouchard M.D.   On: 05/14/2018 18:58   Ct Head Wo Contrast  Result Date: 05/14/2018 CLINICAL DATA:  Altered mental status. EXAM: CT HEAD WITHOUT CONTRAST TECHNIQUE: Contiguous axial images were obtained from the base of the skull through the vertex without intravenous contrast. COMPARISON:  CT head dated June 18, 2014. FINDINGS: Brain: New hypodensity within the left cerebellum. Old infarct within the left  occipital lobe. Progressive periventricular white matter hypodensity along the right frontal horn likely due to chronic microvascular ischemic changes. Stable mild cerebral atrophy. No evidence of hemorrhage, hydrocephalus, extra-axial collection or mass lesion/mass effect. Vascular: Calcified atherosclerosis at the skullbase. No hyperdense vessel. Skull: Negative for fracture or focal lesion. Diffuse calvarial thickening is unchanged. Sinuses/Orbits: No acute finding. Other: None. IMPRESSION: 1. Findings concerning for acute left cerebellar infarct. These results were called by telephone on 05/14/2018 at 7:23 pm to Dr. Merrily Pew , who verbally acknowledged these results. Electronically Signed   By: Titus Dubin M.D.   On: 05/14/2018 19:25   Mr Brain Wo Contrast  Result Date: 05/15/2018 CLINICAL DATA:  Altered mental status for 2 days. History of prior stroke. History of hypertension and diabetes mellitus. EXAM: MRI HEAD WITHOUT CONTRAST TECHNIQUE: Multiplanar, multiecho pulse sequences of the brain and surrounding structures were obtained without intravenous contrast. COMPARISON:  CT head 05/14/2018. FINDINGS: Brain: No evidence for acute infarction, hemorrhage, mass lesion, hydrocephalus, or extra-axial fluid. Generalized atrophy. Chronic microvascular ischemic change appears moderate to advanced. Chronic LEFT occipital PCA territory infarct. Chronic LEFT cerebellar AICA territory infarct. Tiny RIGHT cerebellar infarcts of a chronic nature. Chronic deep white matter infarct affects the RIGHT frontal lobe. Vascular: Flow voids are maintained.  LEFT vertebral is dominant. Skull and upper cervical spine: Unremarkable. Sinuses/Orbits: No sinus or mastoid disease. Other: None. IMPRESSION: Atrophy and small vessel disease with multiple areas of chronic  infarction. Specifically, the question hypodensity in the LEFT cerebellum is a chronic finding. Electronically Signed   By: Staci Righter M.D.   On: 05/15/2018  08:10   US Carotid Bilateral  Result Date: 05/15/2018 CLINICAL DATA:  Stroke.  Altered mental status EXAM: BILATERAL CAROTID DUPLEX ULTRASOUND TECHNIQUE: Pearline Cables scale imaging, color Doppler and duplex ultrasound were performed of bilateral carotid and vertebral arteries in the neck. COMPARISON:  03/26/2010 FINDINGS: Criteria: Quantification of carotid stenosis is based on velocity parameters that correlate the residual internal carotid diameter with NASCET-based stenosis levels, using the diameter of the distal internal carotid lumen as the denominator for stenosis measurement. The following velocity measurements were obtained: RIGHT ICA:  80 cm/sec CCA:  81 cm/sec SYSTOLIC ICA/CCA RATIO:  1.0 DIASTOLIC ICA/CCA RATIO: ECA:  54 cm/sec LEFT ICA:  108 cm/sec CCA:  58 cm/sec SYSTOLIC ICA/CCA RATIO:  1.9 DIASTOLIC ICA/CCA RATIO: ECA:  84 cm/sec RIGHT CAROTID ARTERY: Moderate calcified plaque in the bulb. Low resistance internal carotid Doppler pattern is preserved. RIGHT VERTEBRAL ARTERY:  Antegrade. LEFT CAROTID ARTERY: Markedly prominent irregular calcified plaque is present in the bulb. Low resistance internal carotid Doppler pattern is preserved. LEFT VERTEBRAL ARTERY:  Antegrade. IMPRESSION: Less than 50% stenosis in the right and left internal carotid arteries. Electronically Signed   By: Marybelle Killings M.D.   On: 05/15/2018 09:01    Scheduled Meds: . allopurinol  100 mg Oral Daily  . apixaban  5 mg Oral BID  . atorvastatin  80 mg Oral q1800  . insulin aspart  0-15 Units Subcutaneous TID WC  . insulin aspart  0-5 Units Subcutaneous QHS    Time spent: 30 minutes    [facility] Pager [phone]. If 7PM-7AM, please contact night-coverage at [email or web address], password [email or web address] 05/15/2018, 6:33 PM  LOS: 0 days

## 2018-05-15 NOTE — Progress Notes (Signed)
Pt ambluted to bathroom with assistance.

## 2018-05-16 ENCOUNTER — Observation Stay (HOSPITAL_COMMUNITY)
Admit: 2018-05-16 | Discharge: 2018-05-16 | Disposition: A | Discharge status: Home / Self Care | Payer: Medicare Other | Attending: Internal Medicine | Admitting: Internal Medicine

## 2018-05-16 ENCOUNTER — Encounter (HOSPITAL_COMMUNITY): Payer: Self-pay | Admitting: Student

## 2018-05-16 DIAGNOSIS — R55 Syncope and collapse: Secondary | ICD-10-CM | POA: Diagnosis not present

## 2018-05-16 DIAGNOSIS — E785 Hyperlipidemia, unspecified: Secondary | ICD-10-CM

## 2018-05-16 DIAGNOSIS — I480 Paroxysmal atrial fibrillation: Secondary | ICD-10-CM | POA: Diagnosis not present

## 2018-05-16 DIAGNOSIS — E1169 Type 2 diabetes mellitus with other specified complication: Secondary | ICD-10-CM | POA: Diagnosis not present

## 2018-05-16 DIAGNOSIS — N183 Chronic kidney disease, stage 3 unspecified: Secondary | ICD-10-CM

## 2018-05-16 DIAGNOSIS — I5042 Chronic combined systolic (congestive) and diastolic (congestive) heart failure: Secondary | ICD-10-CM | POA: Diagnosis not present

## 2018-05-16 DIAGNOSIS — I10 Essential (primary) hypertension: Secondary | ICD-10-CM

## 2018-05-16 DIAGNOSIS — I482 Chronic atrial fibrillation: Secondary | ICD-10-CM | POA: Diagnosis not present

## 2018-05-16 DIAGNOSIS — N179 Acute kidney failure, unspecified: Secondary | ICD-10-CM

## 2018-05-16 LAB — BASIC METABOLIC PANEL
Anion gap: 7 (ref 5–15)
BUN: 30 mg/dL — ABNORMAL HIGH (ref 6–20)
CO2: 28 mmol/L (ref 22–32)
Calcium: 9.5 mg/dL (ref 8.9–10.3)
Chloride: 107 mmol/L (ref 101–111)
Creatinine, Ser: 1.65 mg/dL — ABNORMAL HIGH (ref 0.44–1.00)
GFR calc Af Amer: 33 mL/min — ABNORMAL LOW (ref >60)
GFR calc non Af Amer: 28 mL/min — ABNORMAL LOW (ref >60)
Glucose, Bld: 102 mg/dL — ABNORMAL HIGH (ref 65–99)
Potassium: 4.3 mmol/L (ref 3.5–5.1)
Sodium: 142 mmol/L (ref 135–145)

## 2018-05-16 LAB — GLUCOSE, CAPILLARY
Glucose-Capillary: 140 mg/dL — ABNORMAL HIGH (ref 65–99)
Glucose-Capillary: 99 mg/dL (ref 65–99)
Glucose-Capillary: 110 mg/dL — ABNORMAL HIGH (ref 65–99)
Glucose-Capillary: 128 mg/dL — ABNORMAL HIGH (ref 65–99)
Glucose-Capillary: 144 mg/dL — ABNORMAL HIGH (ref 65–99)

## 2018-05-16 LAB — CBC
HCT: 37.1 % (ref 36.0–46.0)
Hemoglobin: 11.1 g/dL — ABNORMAL LOW (ref 12.0–15.0)
MCH: 26.7 pg (ref 26.0–34.0)
MCHC: 29.9 g/dL — ABNORMAL LOW (ref 30.0–36.0)
MCV: 89.2 fL (ref 78.0–100.0)
Platelets: 204 10*3/uL (ref 150–400)
RBC: 4.16 MIL/uL (ref 3.87–5.11)
RDW: 14.3 % (ref 11.5–15.5)
WBC: 5.7 10*3/uL (ref 4.0–10.5)

## 2018-05-16 LAB — VITAMIN B12: Vitamin B-12: 229 pg/mL (ref 180–914)

## 2018-05-16 MED ORDER — HYDRALAZINE HCL 25 MG PO TABS
50.0000 mg | ORAL_TABLET | Freq: Three times a day (TID) | ORAL | Status: DC
  Administered 2018-05-16 – 2018-05-17 (×3): 50 mg via ORAL
  Filled 2018-05-16 (×3): qty 2

## 2018-05-16 MED ORDER — CARVEDILOL 3.125 MG PO TABS
6.2500 mg | ORAL_TABLET | Freq: Two times a day (BID) | ORAL | Status: DC
  Administered 2018-05-16 – 2018-05-17 (×3): 6.25 mg via ORAL
  Filled 2018-05-16 (×3): qty 2

## 2018-05-16 MED ORDER — FUROSEMIDE 40 MG PO TABS
60.0000 mg | ORAL_TABLET | Freq: Every day | ORAL | Status: DC
  Administered 2018-05-16 – 2018-05-17 (×2): 60 mg via ORAL
  Filled 2018-05-16 (×2): qty 1

## 2018-05-16 MED ORDER — DONEPEZIL HCL 5 MG PO TABS
10.0000 mg | ORAL_TABLET | Freq: Every day | ORAL | Status: DC
  Administered 2018-05-16: 10 mg via ORAL
  Filled 2018-05-16: qty 2

## 2018-05-16 NOTE — Progress Notes (Signed)
PROGRESS NOTE  [name] VEL:[id number] DOB: 01-13-1936 DOA: 05/14/2018 PCP: Jani Gravel, MD  Brief History:  82 y.o.femalewith medical history significant forhypothyroidism, type 2 diabetes, hypertension, dyslipidemia, renal artery stenosis, prior CVA, CAD with ischemic cardiomyopathy LVEF 25%, chronic systolic and diastolic CHF, and asthma who was brought to the emergency department via EMS after granddaughter witnessed what appeared to be a syncopal episode earlier this afternoon at approximately 2 PM. She states that the patient noted some shortness of breath when she was walking outside on the porch and within a few seconds of sitting down her eyes rolled back and her head kept jerking back-and-forth. She then went to sleep and by the time she woke up she had some difficulty speaking and appeared to be confused. EMS noted blood glucose of 160mg /dL on the scene.No other symptoms were noted and there were no signs of any urinary or fecal incontinence.  Assessment/Plan: Syncope -Concerned about seizures given clinical history -defer AED therapy to neurology -05/14/2018 CT brain--initially concerned about acute left cerebellar infarct -MRI brain multiple areas of chronic infarct -EEG -Check orthostatic vital signs -Cardiology consult -neurology consult  Chronic atrial fibrillation -Continue apixaban -Restart carvedilol -CHADS-VASc = 7  Chronic diastolic and systolic CHF -8/52/7782 echo EF 50%, diffuse HK, grade 2 DD, moderate MR, trivial TR -Daily weights -Appears clinically euvolemic  Acute on chronic renal failure--CKD stage III -Baseline creatinine 1.5-1.8 -due to volume depletion -serum creatinine peaked 2.30 -Held furosemide temporarily initially -restart home lasix  Diabetes mellitus type 2 -04/25/2018 hemoglobin A1c 6.6 -Holding Januvia and Actos -NovoLog sliding scale  Essential hypertension -Restart carvedilol, hydralazine    Hyperlipidemia -Continue statin  Cognitive impairment -Continue Aricept -Mentation back to baseline      Disposition Plan:   Home 6/6 if stable Family Communication:   Family at bedside  Consultants:  Neurology, cardiology  Code Status:  FULL   DVT Prophylaxis:  apixaban   Procedures: As Listed in Progress Note Above  Antibiotics: None    Subjective: Patient denies fevers, chills, headache, chest pain, dyspnea, nausea, vomiting, diarrhea, abdominal pain, dysuria, hematuria, hematochezia, and melena.   Objective: Vitals:   05/15/18 0736 05/15/18 1359 05/15/18 2003 05/16/18 0459  BP:  (!) 147/83 (!) 163/71 (!) 154/83  Pulse:  71 83 70  Resp:  (!) 21 18 16   Temp:  (!) 97.5 F (36.4 C) 98.2 F (36.8 C) 98 F (36.7 C)  TempSrc:  Oral Oral Oral  SpO2:  100% 96% 98%  Weight: 87.9 kg (193 lb 12.6 oz)     Height:        Intake/Output Summary (Last 24 hours) at 05/16/2018 0949 Last data filed at 05/16/2018 0500 Gross per 24 hour  Intake 480 ml  Output 550 ml  Net -70 ml   Weight change: -1.912 kg (-4 lb 3.5 oz) Exam:   General:  Pt is alert, follows commands appropriately, not in acute distress  HEENT: No icterus, No thrush, No neck mass, [place]/AT  Cardiovascular: RRR, S1/S2, no rubs, no gallops  Respiratory: CTA bilaterally, no wheezing, no crackles, no rhonchi  Abdomen: Soft/+BS, non tender, non distended, no guarding  Extremities: trace LE edema, No lymphangitis, No petechiae, No rashes, no synovitis   Data Reviewed: I have personally reviewed following labs and imaging studies Basic Metabolic Panel: Recent Labs  Lab 05/14/18 1659 05/15/18 0602 05/16/18 0602  NA 141 145 142  K 3.8 3.8 4.3  CL 108  110 107  CO2 24 28 28   GLUCOSE 107* 110* 102*  BUN 31* 34* 30*  CREATININE 2.30* 2.17* 1.65*  CALCIUM 9.1 9.0 9.5   Liver Function Tests: Recent Labs  Lab 05/14/18 1659  AST 12*  ALT 8*  ALKPHOS 100  BILITOT 0.4  PROT 6.9  ALBUMIN 3.6    No results for input(s): LIPASE, AMYLASE in the last 168 hours. No results for input(s): AMMONIA in the last 168 hours. Coagulation Profile: No results for input(s): INR, PROTIME in the last 168 hours. CBC: Recent Labs  Lab 05/14/18 1659 05/15/18 0602 05/16/18 0602  WBC 6.7 5.8 5.7  NEUTROABS 4.4  --   --   HGB 10.9* 10.9* 11.1*  HCT 36.4 37.1 37.1  MCV 87.9 89.6 89.2  PLT 213 198 204   Cardiac Enzymes: Recent Labs  Lab 05/14/18 1659  TROPONINI <0.03   BNP: Invalid input(s): POCBNP CBG: Recent Labs  Lab 05/14/18 1701 05/15/18 0847 05/15/18 1730 05/16/18 0748  GLUCAP 108* 98 115* 99   HbA1C: No results for input(s): HGBA1C in the last 72 hours. Urine analysis:    Component Value Date/Time   COLORURINE YELLOW 07/09/2014 [place] 07/09/2014 0553   LABSPEC 1.015 07/09/2014 0553   PHURINE 5.5 07/09/2014 0553   GLUCOSEU NEGATIVE 07/09/2014 0553   HGBUR NEGATIVE 07/09/2014 0553   BILIRUBINUR NEGATIVE 07/09/2014 0553   KETONESUR TRACE (A) 07/09/2014 0553   PROTEINUR TRACE (A) 07/09/2014 0553   UROBILINOGEN 0.2 07/09/2014 0553   NITRITE POSITIVE (A) 07/09/2014 0553   LEUKOCYTESUR NEGATIVE 07/09/2014 0553   Sepsis Labs: @LABRCNTIP (procalcitonin:4,lacticidven:4) )No results found for this or any previous visit (from the past 240 hour(s)).   Scheduled Meds: . allopurinol  100 mg Oral Daily  . apixaban  5 mg Oral BID  . atorvastatin  80 mg Oral q1800  . insulin aspart  0-15 Units Subcutaneous TID WC  . insulin aspart  0-5 Units Subcutaneous QHS   Continuous Infusions:  Procedures/Studies: Dg Chest 2 View  Result Date: 05/14/2018 CLINICAL DATA:  Altered mental status. EXAM: CHEST - 2 VIEW COMPARISON:  Chest radiograph 04/24/2018 FINDINGS: Stable enlarged cardiac silhouette. Low lung volumes. No effusion, infiltrate pneumothorax. Chronic bronchitic markings noted. No acute osseous abnormality. IMPRESSION: Cardiomegaly without acute  cardiopulmonary findings. Electronically Signed   By: Suzy Bouchard M.D.   On: 05/14/2018 18:58   Dg Chest 2 View  Result Date: 04/24/2018 CLINICAL DATA:  Dry cough and shortness of breath today. EXAM: CHEST - 2 VIEW COMPARISON:  PA and lateral chest 10/01/2015 and 11/10/2014. FINDINGS: There is cardiomegaly and mild vascular congestion, unchanged since the most recent exam. Aortic atherosclerosis is noted. No pneumothorax or pleural fluid. No consolidative process. No acute bony abnormality. IMPRESSION: Chronic cardiomegaly and vascular congestion. No acute disease. Atherosclerosis. Electronically Signed   By: Inge Rise M.D.   On: 04/24/2018 17:31   Ct Head Wo Contrast  Result Date: 05/14/2018 CLINICAL DATA:  Altered mental status. EXAM: CT HEAD WITHOUT CONTRAST TECHNIQUE: Contiguous axial images were obtained from the base of the skull through the vertex without intravenous contrast. COMPARISON:  CT head dated June 18, 2014. FINDINGS: Brain: New hypodensity within the left cerebellum. Old infarct within the left occipital lobe. Progressive periventricular white matter hypodensity along the right frontal horn likely due to chronic microvascular ischemic changes. Stable mild cerebral atrophy. No evidence of hemorrhage, hydrocephalus, extra-axial collection or mass lesion/mass effect. Vascular: Calcified atherosclerosis at the skullbase. No hyperdense vessel. Skull:  Negative for fracture or focal lesion. Diffuse calvarial thickening is unchanged. Sinuses/Orbits: No acute finding. Other: None. IMPRESSION: 1. Findings concerning for acute left cerebellar infarct. These results were called by telephone on 05/14/2018 at 7:23 pm to Dr. Merrily Pew , who verbally acknowledged these results. Electronically Signed   By: Titus Dubin M.D.   On: 05/14/2018 19:25   Ct Chest Wo Contrast  Result Date: 04/24/2018 CLINICAL DATA:  Acute onset of productive cough. Decreased O2 saturation. EXAM: CT CHEST WITHOUT  CONTRAST TECHNIQUE: Multidetector CT imaging of the chest was performed following the standard protocol without IV contrast. COMPARISON:  Chest radiograph performed earlier today at 5:08 p.m. FINDINGS: Cardiovascular: The heart is borderline normal in size. Scattered calcification is noted along the thoracic aorta and proximal great vessels. Scattered coronary artery calcifications are seen. Mediastinum/Nodes: Azygoesophageal recess nodes measure up to 1.8 cm in short axis. Mild nonspecific bilateral hilar prominence is noted. No pericardial effusion is identified. The thyroid gland is unremarkable. No axillary lymphadenopathy is seen. Lungs/Pleura: Patchy airspace opacification at the superior aspect of the right lower lobe is concerning for pneumonia. Evaluation is suboptimal due to motion artifact. Underlying atelectasis is noted bilaterally. No pleural effusion or pneumothorax is seen. Underlying vascular congestion is seen. Upper Abdomen: The visualized portions of the liver and spleen are unremarkable. The patient is status post cholecystectomy, with clips noted at the gallbladder fossa. The visualized portions of the pancreas, adrenal glands and kidneys are within normal limits. Musculoskeletal: No acute osseous abnormalities are identified. The visualized musculature is unremarkable in appearance. IMPRESSION: 1. Patchy airspace opacification at the superior aspect of the right lower lobe is concerning for pneumonia. Mild nonspecific bilateral hilar prominence noted. After completion of treatment for pneumonia, would consider follow-up CT of the chest to ensure resolution of hilar prominence and airspace opacification. 2. Underlying vascular congestion noted. 3. Azygoesophageal recess nodes measure up to 1.8 cm in short axis. This may reflect the underlying infection. 4. Scattered coronary artery calcifications seen. 5. Electronically Signed   By: Garald Balding M.D.   On: 04/24/2018 21:10   Mr Brain Wo  Contrast  Result Date: 05/15/2018 CLINICAL DATA:  Altered mental status for 2 days. History of prior stroke. History of hypertension and diabetes mellitus. EXAM: MRI HEAD WITHOUT CONTRAST TECHNIQUE: Multiplanar, multiecho pulse sequences of the brain and surrounding structures were obtained without intravenous contrast. COMPARISON:  CT head 05/14/2018. FINDINGS: Brain: No evidence for acute infarction, hemorrhage, mass lesion, hydrocephalus, or extra-axial fluid. Generalized atrophy. Chronic microvascular ischemic change appears moderate to advanced. Chronic LEFT occipital PCA territory infarct. Chronic LEFT cerebellar AICA territory infarct. Tiny RIGHT cerebellar infarcts of a chronic nature. Chronic deep white matter infarct affects the RIGHT frontal lobe. Vascular: Flow voids are maintained.  LEFT vertebral is dominant. Skull and upper cervical spine: Unremarkable. Sinuses/Orbits: No sinus or mastoid disease. Other: None. IMPRESSION: Atrophy and small vessel disease with multiple areas of chronic infarction. Specifically, the question hypodensity in the LEFT cerebellum is a chronic finding. Electronically Signed   By: Staci Righter M.D.   On: 05/15/2018 08:10   US Carotid Bilateral  Result Date: 05/15/2018 CLINICAL DATA:  Stroke.  Altered mental status EXAM: BILATERAL CAROTID DUPLEX ULTRASOUND TECHNIQUE: Pearline Cables scale imaging, color Doppler and duplex ultrasound were performed of bilateral carotid and vertebral arteries in the neck. COMPARISON:  03/26/2010 FINDINGS: Criteria: Quantification of carotid stenosis is based on velocity parameters that correlate the residual internal carotid diameter with NASCET-based stenosis levels, using the  diameter of the distal internal carotid lumen as the denominator for stenosis measurement. The following velocity measurements were obtained: RIGHT ICA:  80 cm/sec CCA:  81 cm/sec SYSTOLIC ICA/CCA RATIO:  1.0 DIASTOLIC ICA/CCA RATIO: ECA:  54 cm/sec LEFT ICA:  108 cm/sec CCA:   58 cm/sec SYSTOLIC ICA/CCA RATIO:  1.9 DIASTOLIC ICA/CCA RATIO: ECA:  84 cm/sec RIGHT CAROTID ARTERY: Moderate calcified plaque in the bulb. Low resistance internal carotid Doppler pattern is preserved. RIGHT VERTEBRAL ARTERY:  Antegrade. LEFT CAROTID ARTERY: Markedly prominent irregular calcified plaque is present in the bulb. Low resistance internal carotid Doppler pattern is preserved. LEFT VERTEBRAL ARTERY:  Antegrade. IMPRESSION: Less than 50% stenosis in the right and left internal carotid arteries. Electronically Signed   By: Marybelle Killings M.D.   On: 05/15/2018 09:01   Nm Pulmonary Perf And Vent  Result Date: 04/25/2018 CLINICAL DATA:  82 year old female with acute shortness of breath and elevated D-dimer. EXAM: NUCLEAR MEDICINE VENTILATION - PERFUSION LUNG SCAN TECHNIQUE: Ventilation images were obtained in multiple projections using inhaled aerosol Tc-70m DTPA. Perfusion images were obtained in multiple projections after intravenous injection of Tc-35m-MAA. RADIOPHARMACEUTICALS:  31.4 mCi of Tc-35m DTPA aerosol inhalation and 4.39 mCi Tc8m-MAA IV COMPARISON:  04/24/2018 noncontrast chest CT FINDINGS: Ventilation: Mild patchy nonsegmental areas of decreased ventilation noted. Perfusion: Mild patchy nonsegmental areas of decreased perfusion matched ventilation defects. No wedge shaped peripheral perfusion defects to suggest acute pulmonary embolism. IMPRESSION: Low probability for pulmonary embolus (less than 20%). Electronically Signed   By: Margarette Canada M.D.   On: 04/25/2018 12:47    Orson Eva, DO  [facility] Pager [phone]  If 7PM-7AM, please contact night-coverage [email or web address] Password [email or web address] 05/16/2018, 9:49 AM   LOS: 0 days

## 2018-05-16 NOTE — Progress Notes (Signed)
Son wanted RT to assess pt for breathing treatment... HR 80 RR 16 BS clear SpO2 97% on room air. Son wants pt to have mdi to go home with states "she use to have on but don't have prescription for one now. RN informed.

## 2018-05-16 NOTE — Consult Note (Signed)
[place] A. Katie Laughter, MD     [email or web address]          [name] is an 82 y.o. female.   ASSESSMENT/PLAN: ALTERED MENTAL STATUS:  Etiology most likely multifactorial including acute renal failure and suspected convulsive seizure.  Follow-up EEG results.  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  However, given her large vessel left occipital infarct, she is at risk of post stroke epilepsy.  REMOTE INFARCTS IN THE PATIENT WITH THE ATRIAL FIBRILLATION:  Continue with Eliquis.  COGNITIVE IMPAIRMENT:  Continue with the Aricept.  Dementia labs will be obtained.   The patient is an 82 year old black female who presents with the acute onset of altered mental status.  This was associated with the patient having what appears to be clonic activity of the head and neck region.  She has been confused but has improved afterwards.  It appears that she has baseline cognitive impairment and is on Aricept at baseline.  She cannot provide a lot of history as to why she is in the hospital although she knows she is in the hospital.  She reports no other complaints at this time.  She tells me that her son lives with her.  She tells me that she ambulates with the walker.  The review of systems is limited but otherwise negative.  GENERAL:  This is a very pleasant obese female who is in no acute distress.  HEENT:  This is normal.  ABDOMEN: Soft  EXTREMITIES: No edema; significant arthritic changes of the knees are noted.    BACK: Normal alignment.  SKIN: Normal by inspection.    MENTAL STATUS:  She is awake and alert.  She knows she is in the hospital.  She is oriented to the month but not the year.  She is not generally oriented to her overall well-being why she is in the hospital.  CRANIAL NERVES: Pupils are equal, round and reactive to light and accommodation; extraocular movements are full, there is no significant  nystagmus; upper and lower facial muscles are normal in strength and symmetric, there is no flattening of the nasolabial folds; tongue is midline; uvula is midline; shoulder elevation is normal.  MOTOR: Normal tone, bulk and strength; no pronator drift.  COORDINATION: Left finger to nose is normal, right finger to nose is normal, No rest tremor; no intention tremor; no postural tremor; no bradykinesia.  REFLEXES: Deep tendon reflexes are symmetrical and normal.   SENSATION: Normal to light touch and temperature.           Blood pressure (!) 154/83, pulse 70, temperature 98 F (36.7 C), temperature source Oral, resp. rate 16, height '5\' 3"'  (1.6 m), weight 193 lb 12.6 oz (87.9 kg), SpO2 98 %.  Past Medical History:  Diagnosis Date  . Asthma   . Bronchitis   . Carotid artery disease ([place])   . Coronary atherosclerosis of native coronary artery    Nonobstructive at cath 2005  . DM2 (diabetes mellitus, type 2) ([place])   . Essential hypertension, benign   . Gout   . History of stroke   . Hypothyroidism   . Influenza 2013  . Nonischemic cardiomyopathy ([facility])    LVEF 20-25%  . Renal artery stenosis ([facility])    Bilateral  . Stroke [facility])     Past Surgical History:  Procedure Laterality Date  . ABDOMINAL HYSTERECTOMY      Family History  Problem Relation Age of Onset  . Asthma  Son     Social History:  reports that she has never smoked. She has never used smokeless tobacco. She reports that she does not drink alcohol or use drugs.  Allergies:  Allergies  Allergen Reactions  . [place] Venom [Bee Venom] Shortness Of Breath and Itching    Medications: Prior to Admission medications   Medication Sig Start Date End Date Taking? Authorizing Provider  allopurinol (ZYLOPRIM) 100 MG tablet TAKE ONE TABLET BY MOUTH ONCE DAILY FOR GOUT 07/01/17  Yes [provider]  apixaban (ELIQUIS) 5 MG TABS tablet Take 1 tablet (5 mg total) by mouth 2 (two) times daily. 04/27/18  Yes  Barton Dubois, MD  atorvastatin (LIPITOR) 20 MG tablet Take 1 tablet (20 mg total) by mouth daily at 6 PM. 04/27/18  Yes Barton Dubois, MD  carvedilol (COREG) 12.5 MG tablet Take 1 tablet (12.5 mg total) by mouth 2 (two) times daily with a meal. 04/27/18  Yes Barton Dubois, MD  EPINEPHrine 0.3 mg/0.3 mL IJ SOAJ injection Inject 0.3 mLs (0.3 mg total) into the muscle once as needed (severe allergic reaction). 09/26/17  Yes Forde Dandy, MD  furosemide (LASIX) 20 MG tablet Take 3 tablets (60 mg total) by mouth daily. 04/28/18  Yes Barton Dubois, MD  gabapentin (NEURONTIN) 100 MG capsule Take 100 mg by mouth 3 (three) times daily.    Yes [provider]  hydrALAZINE (APRESOLINE) 50 MG tablet Take 1.5 tablets (75 mg total) by mouth every 8 (eight) hours. 04/27/18  Yes Barton Dubois, MD  isosorbide mononitrate (IMDUR) 30 MG 24 hr tablet Take 1 tablet (30 mg total) by mouth daily. 04/28/18  Yes Barton Dubois, MD  pioglitazone (ACTOS) 15 MG tablet TAKE ONE TABLET BY MOUTH ONCE DAILY FOR DIABETES 08/12/17  Yes [provider]  sitaGLIPtin (JANUVIA) 50 MG tablet Take 50 mg by mouth daily.   Yes [provider]  donepezil (ARICEPT) 10 MG tablet Take 10 mg by mouth daily.    [provider]  famotidine (PEPCID) 20 MG tablet Take 1 tablet (20 mg total) by mouth daily. Patient not taking: Reported on 05/14/2018 04/27/18   Barton Dubois, MD    Scheduled Meds: . allopurinol  100 mg Oral Daily  . apixaban  5 mg Oral BID  . atorvastatin  80 mg Oral q1800  . insulin aspart  0-15 Units Subcutaneous TID WC  . insulin aspart  0-5 Units Subcutaneous QHS   Continuous Infusions: PRN Meds:.acetaminophen **OR** acetaminophen, ondansetron **OR** ondansetron (ZOFRAN) IV     Results for orders placed or performed during the hospital encounter of 05/14/18 (from the past 48 hour(s))  CBC with Differential     Status: Abnormal   Collection Time: 05/14/18  4:59 PM  Result Value Ref  Range   WBC 6.7 4.0 - 10.5 K/uL   RBC 4.14 3.87 - 5.11 MIL/uL   Hemoglobin 10.9 (L) 12.0 - 15.0 g/dL   HCT 36.4 36.0 - 46.0 %   MCV 87.9 78.0 - 100.0 fL   MCH 26.3 26.0 - 34.0 pg   MCHC 29.9 (L) 30.0 - 36.0 g/dL   RDW 14.5 11.5 - 15.5 %   Platelets 213 150 - 400 K/uL   Neutrophils Relative % 66 %   Neutro Abs 4.4 1.7 - 7.7 K/uL   Lymphocytes Relative 21 %   Lymphs Abs 1.4 0.7 - 4.0 K/uL   Monocytes Relative 11 %   Monocytes Absolute 0.8 0.1 - 1.0 K/uL   Eosinophils  Relative 2 %   Eosinophils Absolute 0.1 0.0 - 0.7 K/uL   Basophils Relative 0 %   Basophils Absolute 0.0 0.0 - 0.1 K/uL    Comment: Performed at [facility], [address] [place], [place] [address]  Comprehensive metabolic panel     Status: Abnormal   Collection Time: 05/14/18  4:59 PM  Result Value Ref Range   Sodium 141 135 - 145 mmol/L   Potassium 3.8 3.5 - 5.1 mmol/L   Chloride 108 101 - 111 mmol/L   CO2 24 22 - 32 mmol/L   Glucose, Bld 107 (H) 65 - 99 mg/dL   BUN 31 (H) 6 - 20 mg/dL   Creatinine, Ser 2.30 (H) 0.44 - 1.00 mg/dL   Calcium 9.1 8.9 - 10.3 mg/dL   Total Protein 6.9 6.5 - 8.1 g/dL   Albumin 3.6 3.5 - 5.0 g/dL   AST 12 (L) 15 - 41 U/L   ALT 8 (L) 14 - 54 U/L   Alkaline Phosphatase 100 38 - 126 U/L   Total Bilirubin 0.4 0.3 - 1.2 mg/dL   GFR calc non Af Amer 19 (L) >60 mL/min   GFR calc Af Amer 22 (L) >60 mL/min    Comment: (NOTE) The eGFR has been calculated using the CKD EPI equation. This calculation has not been validated in all clinical situations. eGFR's persistently <60 mL/min signify possible Chronic Kidney Disease.    Anion gap 9 5 - 15    Comment: Performed at [facility], [address], [place], [place] [address]  Troponin I     Status: None   Collection Time: 05/14/18  4:59 PM  Result Value Ref Range   Troponin I <0.03 <0.03 ng/mL    Comment: Performed at [facility], [address], [place], [place] [address]  Brain natriuretic peptide     Status: Abnormal   Collection  Time: 05/14/18  4:59 PM  Result Value Ref Range   B Natriuretic Peptide 328.0 (H) 0.0 - 100.0 pg/mL    Comment: Performed at [facility], [address], [place], [place] [address]  CBG monitoring, ED     Status: Abnormal   Collection Time: 05/14/18  5:01 PM  Result Value Ref Range   Glucose-Capillary 108 (H) 65 - 99 mg/dL  Basic metabolic panel     Status: Abnormal   Collection Time: 05/15/18  6:02 AM  Result Value Ref Range   Sodium 145 135 - 145 mmol/L   Potassium 3.8 3.5 - 5.1 mmol/L   Chloride 110 101 - 111 mmol/L   CO2 28 22 - 32 mmol/L   Glucose, Bld 110 (H) 65 - 99 mg/dL   BUN 34 (H) 6 - 20 mg/dL   Creatinine, Ser 2.17 (H) 0.44 - 1.00 mg/dL   Calcium 9.0 8.9 - 10.3 mg/dL   GFR calc non Af Amer 20 (L) >60 mL/min   GFR calc Af Amer 24 (L) >60 mL/min    Comment: (NOTE) The eGFR has been calculated using the CKD EPI equation. This calculation has not been validated in all clinical situations. eGFR's persistently <60 mL/min signify possible Chronic Kidney Disease.    Anion gap 7 5 - 15    Comment: Performed at [facility], [address], [place], [place] [address]  CBC     Status: Abnormal   Collection Time: 05/15/18  6:02 AM  Result Value Ref Range   WBC 5.8 4.0 - 10.5 K/uL   RBC 4.14 3.87 - 5.11 MIL/uL  Hemoglobin 10.9 (L) 12.0 - 15.0 g/dL   HCT 37.1 36.0 - 46.0 %   MCV 89.6 78.0 - 100.0 fL   MCH 26.3 26.0 - 34.0 pg   MCHC 29.4 (L) 30.0 - 36.0 g/dL   RDW 14.6 11.5 - 15.5 %   Platelets 198 150 - 400 K/uL    Comment: Performed at [facility], [address] [place], [place] [address]  Glucose, capillary     Status: None   Collection Time: 05/15/18  8:47 AM  Result Value Ref Range   Glucose-Capillary 98 65 - 99 mg/dL  Glucose, capillary     Status: Abnormal   Collection Time: 05/15/18  5:30 PM  Result Value Ref Range   Glucose-Capillary 115 (H) 65 - 99 mg/dL  Basic metabolic panel     Status: Abnormal   Collection Time: 05/16/18  6:02 AM  Result Value Ref  Range   Sodium 142 135 - 145 mmol/L   Potassium 4.3 3.5 - 5.1 mmol/L   Chloride 107 101 - 111 mmol/L   CO2 28 22 - 32 mmol/L   Glucose, Bld 102 (H) 65 - 99 mg/dL   BUN 30 (H) 6 - 20 mg/dL   Creatinine, Ser 1.65 (H) 0.44 - 1.00 mg/dL   Calcium 9.5 8.9 - 10.3 mg/dL   GFR calc non Af Amer 28 (L) >60 mL/min   GFR calc Af Amer 33 (L) >60 mL/min    Comment: (NOTE) The eGFR has been calculated using the CKD EPI equation. This calculation has not been validated in all clinical situations. eGFR's persistently <60 mL/min signify possible Chronic Kidney Disease.    Anion gap 7 5 - 15    Comment: Performed at [facility], [address], [place], [place] [address]  CBC     Status: Abnormal   Collection Time: 05/16/18  6:02 AM  Result Value Ref Range   WBC 5.7 4.0 - 10.5 K/uL   RBC 4.16 3.87 - 5.11 MIL/uL   Hemoglobin 11.1 (L) 12.0 - 15.0 g/dL   HCT 37.1 36.0 - 46.0 %   MCV 89.2 78.0 - 100.0 fL   MCH 26.7 26.0 - 34.0 pg   MCHC 29.9 (L) 30.0 - 36.0 g/dL   RDW 14.3 11.5 - 15.5 %   Platelets 204 150 - 400 K/uL    Comment: Performed at [facility], [address], [place], [place] [address]  Glucose, capillary     Status: None   Collection Time: 05/16/18  7:48 AM  Result Value Ref Range   Glucose-Capillary 99 65 - 99 mg/dL    Studies/Results: BRAIN MRI FINDINGS: Brain: No evidence for acute infarction, hemorrhage, mass lesion, hydrocephalus, or extra-axial fluid. Generalized atrophy. Chronic microvascular ischemic change appears moderate to advanced.  Chronic LEFT occipital PCA territory infarct. Chronic LEFT cerebellar AICA territory infarct. Tiny RIGHT cerebellar infarcts of a chronic nature. Chronic deep white matter infarct affects the RIGHT frontal lobe.  Vascular: Flow voids are maintained.  LEFT vertebral is dominant.  Skull and upper cervical spine: Unremarkable.  Sinuses/Orbits: No sinus or mastoid disease.  Other: None.  IMPRESSION: Atrophy and small vessel  disease with multiple areas of chronic infarction. Specifically, the question hypodensity in the LEFT cerebellum is a chronic finding.   CAROTID DOPPLERS IMPRESSION: Less than 50% stenosis in the right and left internal carotid Arteries.   HEAD CT FINDINGS: Brain: New hypodensity within the left cerebellum. Old infarct within the left occipital lobe. Progressive periventricular white matter hypodensity  along the right frontal horn likely due to chronic microvascular ischemic changes. Stable mild cerebral atrophy. No evidence of hemorrhage, hydrocephalus, extra-axial collection or mass lesion/mass effect.  Vascular: Calcified atherosclerosis at the skullbase. No hyperdense vessel.  Skull: Negative for fracture or focal lesion. Diffuse calvarial thickening is unchanged.  Sinuses/Orbits: No acute finding.  Other: None.  IMPRESSION: 1. Findings concerning for acute left cerebellar infarct      The brain MRI is reviewed in person.  No acute infarcts are seen on DWI.  No hemorrhage appreciated.  Chronic encephalomalacia is seen on FLAIR and T1 involving the medial occipital lobe on the left in the left large vessel infarct involving the inferior cerebellum.  There are small vessel encephalomalacia involving the right frontal lobe and the inferior cerebellum on the right side.   TSH 2.2  04-2018   [name], M.D.  Diplomate, Tax adviser of Psychiatry and Neurology ( Neurology). 05/16/2018, 8:24 AM

## 2018-05-16 NOTE — Procedures (Signed)
  [place] A. Merlene Laughter, MD     [email or web address]           HISTORY: This is a 82 year old female who presents with confusion and altered mental status.  This has been.evaluate for seizures as etiology.  MEDICATIONS: Scheduled Meds: . allopurinol  100 mg Oral Daily  . apixaban  5 mg Oral BID  . atorvastatin  80 mg Oral q1800  . carvedilol  6.25 mg Oral BID WC  . donepezil  10 mg Oral QHS  . furosemide  60 mg Oral Daily  . hydrALAZINE  50 mg Oral Q8H  . insulin aspart  0-15 Units Subcutaneous TID WC  . insulin aspart  0-5 Units Subcutaneous QHS   Continuous Infusions: PRN Meds:.acetaminophen **OR** acetaminophen, ondansetron **OR** ondansetron (ZOFRAN) IV  Prior to Admission medications   Medication Sig Start Date End Date Taking? Authorizing Provider  allopurinol (ZYLOPRIM) 100 MG tablet TAKE ONE TABLET BY MOUTH ONCE DAILY FOR GOUT 07/01/17  Yes [provider]  apixaban (ELIQUIS) 5 MG TABS tablet Take 1 tablet (5 mg total) by mouth 2 (two) times daily. 04/27/18  Yes Barton Dubois, MD  atorvastatin (LIPITOR) 20 MG tablet Take 1 tablet (20 mg total) by mouth daily at 6 PM. 04/27/18  Yes Barton Dubois, MD  carvedilol (COREG) 12.5 MG tablet Take 1 tablet (12.5 mg total) by mouth 2 (two) times daily with a meal. 04/27/18  Yes Barton Dubois, MD  EPINEPHrine 0.3 mg/0.3 mL IJ SOAJ injection Inject 0.3 mLs (0.3 mg total) into the muscle once as needed (severe allergic reaction). 09/26/17  Yes Forde Dandy, MD  furosemide (LASIX) 20 MG tablet Take 3 tablets (60 mg total) by mouth daily. 04/28/18  Yes Barton Dubois, MD  gabapentin (NEURONTIN) 100 MG capsule Take 100 mg by mouth 3 (three) times daily.    Yes [provider]  hydrALAZINE (APRESOLINE) 50 MG tablet Take 1.5 tablets (75 mg total) by mouth every 8 (eight) hours. 04/27/18  Yes Barton Dubois, MD  isosorbide mononitrate (IMDUR) 30 MG 24 hr tablet Take 1 tablet (30 mg total) by mouth daily.  04/28/18  Yes Barton Dubois, MD  pioglitazone (ACTOS) 15 MG tablet TAKE ONE TABLET BY MOUTH ONCE DAILY FOR DIABETES 08/12/17  Yes [provider]  sitaGLIPtin (JANUVIA) 50 MG tablet Take 50 mg by mouth daily.   Yes [provider]  donepezil (ARICEPT) 10 MG tablet Take 10 mg by mouth daily.    [provider]  famotidine (PEPCID) 20 MG tablet Take 1 tablet (20 mg total) by mouth daily. Patient not taking: Reported on 05/14/2018 04/27/18   Barton Dubois, MD      ANALYSIS: A 16 channel recording using standard 10 20 measurements is conducted for 21 minutes.  The background shows a well-formed theta - 7-7.5 Hz activity.  There is beta activity observed in the frontal areas.  Awake and drowsy activities are observed.  Photic stimulation and hypoventilation are not conducted.  There is no focal or lateralized slowing.  There is no epileptiform activity is observed.   IMPRESSION: 1.  This recording of the awake and drowsy state shows mild global slowing indicating a mild global encephalopathy.  However, there is no epileptiform activity is observed.      Suzzane Quilter A. Merlene Laughter, M.D.  Diplomate, Tax adviser of Psychiatry and Neurology ( Neurology).

## 2018-05-16 NOTE — Progress Notes (Signed)
EEG completed, results pending. 

## 2018-05-16 NOTE — Consult Note (Addendum)
Cardiology Consult    [name] ID: LUCEAL HOLLIBAUGH; [id number]; 1936/03/21   Admit date: 05/14/2018 Date of Consult: 05/16/2018  Primary Care Provider: Jani Gravel, MD Primary Cardiologist: Katie Lesches, MD   [name] Profile    [name] is a 82 y.o. female with past medical history of chronic combined systolic and diastolic CHF, presumed nonischemic cardiomyopathy (EF 20 to 25% by echocardiogram in 2016, 30% by echo in 04/2018), paroxysmal atrial fibrillation, HTN, HLD, Stage 3 CKD and prior CVA who is being seen today for Katie evaluation of syncope at Katie request of Dr. Carles [name].   History of Present Illness    [name] was recently admitted to [facility] from 04/24/2018 to 04/27/2018 for community-acquired pneumonia but was found to have atrial fibrillation with RVR during admission which is a new diagnosis for her. Cardiology was consulted and a repeat echocardiogram was obtained which showed her EF was at 30% with diffuse hypokinesis noted and grade 2 diastolic dysfunction. She was started on Eliquis 5 mg twice daily for anticoagulation and Carvedilol was further titrated to 12.5 mg twice daily prior to discharge.  [name] presented back to [facility] ED on 05/14/2018 for evaluation of an episode of decreased responsiveness at which time she had shaking along her extremities and her eyes rolled back in her head per report from her family members.  In talking with [name] today, she is unable to provide much history surrounding this event. One of her sons is present but he reports he was not at Katie home when Katie episode occurred.   Initial labs showed WBC 6.7, Hgb 10.9, platelets 213, Na+ 141, K+ 3.8, and creatinine 2.30 (baseline 1.2 - 1.4).  Initial troponin was negative.  BNP was slightly elevated at 328.  CXR showed cardiomegaly with no acute findings.  CT head was concerning for an acute left cerebellar infarct with her brain MRI showing atrophy and small vessel disease with a  hypodensity in Katie left cerebellum which was a chronic finding.  Rotted Dopplers were obtained and showed less than 50% stenosis bilaterally. EKG showed what appears to be most consistent with atrial fibrillation, heart rate 71, with known right bundle branch block. She has been followed on telemetry and has had intermittent episodes of atrial fibrillation this admission but heart rate has overall been well controlled in Katie 70s to 80s and no significant pauses are noted.  She reports overall feeling back to baseline at this time and is very anxious to go home. She becomes very tearful and upset when she finds out that discharge will likely be tomorrow due to her pending EEG results.    Past Medical History:  Diagnosis Date  . Asthma   . Bronchitis   . Carotid artery disease ([place])   . Coronary atherosclerosis of native coronary artery    Nonobstructive at cath 2005  . DM2 (diabetes mellitus, type 2) ([place])   . Essential hypertension, benign   . Gout   . History of stroke   . Hypothyroidism   . Influenza 2013  . Nonischemic cardiomyopathy (HCC)    LVEF 20-25%  . PAF (paroxysmal atrial fibrillation) ([place])    a. diagnosed in 04/2018. Started on Eliquis for anticoagulation.   . Renal artery stenosis (HCC)    Bilateral  . Stroke [facility])     Past Surgical History:  Procedure Laterality Date  . ABDOMINAL HYSTERECTOMY       Home Medications:  Prior to Admission medications  Medication Sig Start Date End Date Taking? Authorizing Provider  allopurinol (ZYLOPRIM) 100 MG tablet TAKE ONE TABLET BY MOUTH ONCE DAILY FOR GOUT 07/01/17  Yes [provider]  apixaban (ELIQUIS) 5 MG TABS tablet Take 1 tablet (5 mg total) by mouth 2 (two) times daily. 04/27/18  Yes Barton Dubois, MD  atorvastatin (LIPITOR) 20 MG tablet Take 1 tablet (20 mg total) by mouth daily at 6 PM. 04/27/18  Yes Barton Dubois, MD  carvedilol (COREG) 12.5 MG tablet Take 1 tablet (12.5 mg total) by mouth 2 (two) times  daily with a meal. 04/27/18  Yes Barton Dubois, MD  EPINEPHrine 0.3 mg/0.3 mL IJ SOAJ injection Inject 0.3 mLs (0.3 mg total) into Katie muscle once as needed (severe allergic reaction). 09/26/17  Yes Forde Dandy, MD  furosemide (LASIX) 20 MG tablet Take 3 tablets (60 mg total) by mouth daily. 04/28/18  Yes Barton Dubois, MD  gabapentin (NEURONTIN) 100 MG capsule Take 100 mg by mouth 3 (three) times daily.    Yes [provider]  hydrALAZINE (APRESOLINE) 50 MG tablet Take 1.5 tablets (75 mg total) by mouth every 8 (eight) hours. 04/27/18  Yes Barton Dubois, MD  isosorbide mononitrate (IMDUR) 30 MG 24 hr tablet Take 1 tablet (30 mg total) by mouth daily. 04/28/18  Yes Barton Dubois, MD  pioglitazone (ACTOS) 15 MG tablet TAKE ONE TABLET BY MOUTH ONCE DAILY FOR DIABETES 08/12/17  Yes [provider]  sitaGLIPtin (JANUVIA) 50 MG tablet Take 50 mg by mouth daily.   Yes [provider]  donepezil (ARICEPT) 10 MG tablet Take 10 mg by mouth daily.    [provider]  famotidine (PEPCID) 20 MG tablet Take 1 tablet (20 mg total) by mouth daily. [name] not taking: Reported on 05/14/2018 04/27/18   Barton Dubois, MD    Inpatient Medications: Scheduled Meds: . allopurinol  100 mg Oral Daily  . apixaban  5 mg Oral BID  . atorvastatin  80 mg Oral q1800  . carvedilol  6.25 mg Oral BID WC  . donepezil  10 mg Oral QHS  . furosemide  60 mg Oral Daily  . hydrALAZINE  50 mg Oral Q8H  . insulin aspart  0-15 Units Subcutaneous TID WC  . insulin aspart  0-5 Units Subcutaneous QHS   Continuous Infusions:  PRN Meds: acetaminophen **OR** acetaminophen, ondansetron **OR** ondansetron (ZOFRAN) IV  Allergies:    Allergies  Allergen Reactions  . [place] Venom [Bee Venom] Shortness Of Breath and Itching    Social History:   Social History   Socioeconomic History  . Marital status: Single    Spouse name: Not on file  . Number of children: 14  . Years of education: Not  on file  . Highest education level: Not on file  Occupational History  . Occupation: Retired  Scientific laboratory technician  . Financial resource strain: Not on file  . Food insecurity:    Worry: Not on file    Inability: Not on file  . Transportation needs:    Medical: Not on file    Non-medical: Not on file  Tobacco Use  . Smoking status: Never Smoker  . Smokeless tobacco: Never Used  Substance and Sexual Activity  . Alcohol use: No    Alcohol/week: 0.0 oz  . Drug use: No  . Sexual activity: Never    Birth control/protection: Surgical  Lifestyle  . Physical activity:    Days per week: Not on file    Minutes per  session: Not on file  . Stress: Not on file  Relationships  . Social connections:    Talks on phone: Not on file    Gets together: Not on file    Attends religious service: Not on file    Active member of club or organization: Not on file    Attends meetings of clubs or organizations: Not on file    Relationship status: Not on file  . Intimate partner violence:    Fear of current or ex partner: Not on file    Emotionally abused: Not on file    Physically abused: Not on file    Forced sexual activity: Not on file  Other Topics Concern  . Not on file  Social History Narrative  . Not on file     Family History:    Family History  Problem Relation Age of Onset  . Asthma Son       Review of Systems    General:  No chills, fever, night sweats or weight changes. Positive for recent episode of LOC.  Cardiovascular:  No chest pain, dyspnea on exertion, edema, orthopnea, palpitations, paroxysmal nocturnal dyspnea. Dermatological: No rash, lesions/masses Respiratory: No cough, dyspnea Urologic: No hematuria, dysuria Abdominal:   No nausea, vomiting, diarrhea, bright red blood per rectum, melena, or hematemesis Neurologic:  No visual changes, wkns, changes in mental status.  All other systems reviewed and are otherwise negative except as noted above.  Physical Exam/Data      Vitals:   05/15/18 1359 05/15/18 2003 05/16/18 0459 05/16/18 1429  BP: (!) 147/83 (!) 163/71 (!) 154/83 (!) 160/96  Pulse: 71 83 70 77  Resp: (!) 21 18 16 16   Temp: (!) 97.5 F (36.4 C) 98.2 F (36.8 C) 98 F (36.7 C) (!) 97.5 F (36.4 C)  TempSrc: Oral Oral Oral Oral  SpO2: 100% 96% 98% 100%  Weight:      Height:        Intake/Output Summary (Last 24 hours) at 05/16/2018 1738 Last data filed at 05/16/2018 1200 Gross per 24 hour  Intake 480 ml  Output 750 ml  Net -270 ml   Filed Weights   05/14/18 1552 05/15/18 0736  Weight: 198 lb (89.8 kg) 193 lb 12.6 oz (87.9 kg)   Body mass index is 34.33 kg/m.   General: Pleasant elderly African American female appearing in NAD Psych: Normal affect. Neuro: Alert and oriented X 3. Moves all extremities spontaneously. HEENT: Normal  Neck: Supple without bruits or JVD. Lungs:  Resp regular and unlabored, CTA without wheezing or rales. Heart: RRR no s3, s4, or murmurs. Abdomen: Soft, non-tender, non-distended, BS + x 4.  Extremities: No clubbing, cyanosis or edema. DP/PT/Radials 2+ and equal bilaterally.   Labs/Studies     Relevant CV Studies:  Echocardiogram: 04/25/2018 Study Conclusions  - Left ventricle: Katie cavity size was normal. Wall thickness was   increased in a pattern of moderate LVH. Systolic function was   severely reduced. Katie estimated ejection fraction was 30%.   Diffuse hypokinesis, most prominent basal inferolateral wall.   Features are consistent with a pseudonormal left ventricular   filling pattern, with concomitant abnormal relaxation and   increased filling pressure (grade 2 diastolic dysfunction). - Aortic valve: Mildly calcified annulus. Trileaflet. - Mitral valve: Mildly thickened leaflets. There was moderate   regurgitation. - Left atrium: Katie atrium was mildly dilated. - Right atrium: Central venous pressure (est): 3 mm Hg. - Tricuspid valve: There was trivial regurgitation. - Pulmonary  arteries: PA peak pressure: 37 mm Hg (S). - Pericardium, extracardiac: There was no pericardial effusion.  Laboratory Data:  Chemistry Recent Labs  Lab 05/14/18 1659 05/15/18 0602 05/16/18 0602  NA 141 145 142  K 3.8 3.8 4.3  CL 108 110 107  CO2 24 28 28   GLUCOSE 107* 110* 102*  BUN 31* 34* 30*  CREATININE 2.30* 2.17* 1.65*  CALCIUM 9.1 9.0 9.5  GFRNONAA 19* 20* 28*  GFRAA 22* 24* 33*  ANIONGAP 9 7 7     Recent Labs  Lab 05/14/18 1659  PROT 6.9  ALBUMIN 3.6  AST 12*  ALT 8*  ALKPHOS 100  BILITOT 0.4   Hematology Recent Labs  Lab 05/14/18 1659 05/15/18 0602 05/16/18 0602  WBC 6.7 5.8 5.7  RBC 4.14 4.14 4.16  HGB 10.9* 10.9* 11.1*  HCT 36.4 37.1 37.1  MCV 87.9 89.6 89.2  MCH 26.3 26.3 26.7  MCHC 29.9* 29.4* 29.9*  RDW 14.5 14.6 14.3  PLT 213 198 204   Cardiac Enzymes Recent Labs  Lab 05/14/18 1659  TROPONINI <0.03   No results for input(s): TROPIPOC in Katie last 168 hours.  BNP Recent Labs  Lab 05/14/18 1659  BNP 328.0*    DDimer No results for input(s): DDIMER in Katie last 168 hours.  Radiology/Studies:  Dg Chest 2 View  Result Date: 05/14/2018 CLINICAL DATA:  Altered mental status. EXAM: CHEST - 2 VIEW COMPARISON:  Chest radiograph 04/24/2018 FINDINGS: Stable enlarged cardiac silhouette. Low lung volumes. No effusion, infiltrate pneumothorax. Chronic bronchitic markings noted. No acute osseous abnormality. IMPRESSION: Cardiomegaly without acute cardiopulmonary findings. Electronically Signed   By: Suzy Bouchard M.D.   On: 05/14/2018 18:58   Ct Head Wo Contrast  Result Date: 05/14/2018 CLINICAL DATA:  Altered mental status. EXAM: CT HEAD WITHOUT CONTRAST TECHNIQUE: Contiguous axial images were obtained from Katie base of Katie skull through Katie vertex without intravenous contrast. COMPARISON:  CT head dated June 18, 2014. FINDINGS: Brain: New hypodensity within Katie left cerebellum. Old infarct within Katie left occipital lobe. Progressive periventricular  white matter hypodensity along Katie right frontal horn likely due to chronic microvascular ischemic changes. Stable mild cerebral atrophy. No evidence of hemorrhage, hydrocephalus, extra-axial collection or mass lesion/mass effect. Vascular: Calcified atherosclerosis at Katie skullbase. No hyperdense vessel. Skull: Negative for fracture or focal lesion. Diffuse calvarial thickening is unchanged. Sinuses/Orbits: No acute finding. Other: None. IMPRESSION: 1. Findings concerning for acute left cerebellar infarct. These results were called by telephone on 05/14/2018 at 7:23 pm to Dr. Merrily Pew , who verbally acknowledged these results. Electronically Signed   By: Titus Dubin M.D.   On: 05/14/2018 19:25   Mr Brain Wo Contrast  Result Date: 05/15/2018 CLINICAL DATA:  Altered mental status for 2 days. History of prior stroke. History of hypertension and diabetes mellitus. EXAM: MRI HEAD WITHOUT CONTRAST TECHNIQUE: Multiplanar, multiecho pulse sequences of Katie brain and surrounding structures were obtained without intravenous contrast. COMPARISON:  CT head 05/14/2018. FINDINGS: Brain: No evidence for acute infarction, hemorrhage, mass lesion, hydrocephalus, or extra-axial fluid. Generalized atrophy. Chronic microvascular ischemic change appears moderate to advanced. Chronic LEFT occipital PCA territory infarct. Chronic LEFT cerebellar AICA territory infarct. Tiny RIGHT cerebellar infarcts of a chronic nature. Chronic deep white matter infarct affects Katie RIGHT frontal lobe. Vascular: Flow voids are maintained.  LEFT vertebral is dominant. Skull and upper cervical spine: Unremarkable. Sinuses/Orbits: No sinus or mastoid disease. Other: None. IMPRESSION: Atrophy and small vessel disease with multiple areas of chronic infarction. Specifically,  Katie question hypodensity in Katie LEFT cerebellum is a chronic finding. Electronically Signed   By: Staci Righter M.D.   On: 05/15/2018 08:10   US Carotid Bilateral  Result Date:  05/15/2018 CLINICAL DATA:  Stroke.  Altered mental status EXAM: BILATERAL CAROTID DUPLEX ULTRASOUND TECHNIQUE: Pearline Cables scale imaging, color Doppler and duplex ultrasound were performed of bilateral carotid and vertebral arteries in Katie neck. COMPARISON:  03/26/2010 FINDINGS: Criteria: Quantification of carotid stenosis is based on velocity parameters that correlate Katie residual internal carotid diameter with NASCET-based stenosis levels, using Katie diameter of Katie distal internal carotid lumen as Katie denominator for stenosis measurement. Katie following velocity measurements were obtained: RIGHT ICA:  80 cm/sec CCA:  81 cm/sec SYSTOLIC ICA/CCA RATIO:  1.0 DIASTOLIC ICA/CCA RATIO: ECA:  54 cm/sec LEFT ICA:  108 cm/sec CCA:  58 cm/sec SYSTOLIC ICA/CCA RATIO:  1.9 DIASTOLIC ICA/CCA RATIO: ECA:  84 cm/sec RIGHT CAROTID ARTERY: Moderate calcified plaque in Katie bulb. Low resistance internal carotid Doppler pattern is preserved. RIGHT VERTEBRAL ARTERY:  Antegrade. LEFT CAROTID ARTERY: Markedly prominent irregular calcified plaque is present in Katie bulb. Low resistance internal carotid Doppler pattern is preserved. LEFT VERTEBRAL ARTERY:  Antegrade. IMPRESSION: Less than 50% stenosis in Katie right and left internal carotid arteries. Electronically Signed   By: Marybelle Killings M.D.   On: 05/15/2018 09:01     Assessment & Plan    1. Syncopal Event - In talking with [name] today, history is difficult to obtain but it sounds like she became unresponsive Katie day of admission and family witnessed her eyes rolling back and having shaking along her upper extremities. Overall, this seems very atypical for a cardiac etiology and most concerning for seizure-like activity.  Neurology has been consulted and is following [name] as her CT head upon admission was most concerning for an acute left cerebellar infarct but this appears more chronic by brain MRI.  Carotids show no significant stenosis. An EEG has been performed with  results pending. - Orthostatic vitals were not obtained at Katie time of admission and she has received IVF due to her AKI.  Kidney function has improved and she was restarted on her PTA Lasix this afternoon.  - Telemetry shows intermittent episodes of atrial fibrillation but her overall heart rate remains well controlled in Katie 70's to 80's at baseline and no significant pauses are noted.  - Would not pursue further cardiac evaluation at this time. Appreciate Neurology's input.  2. Chronic Combined Systolic and Diastolic CHF - [name] has a known reduced EF at 30% by her most recent echocardiogram. Lasix was held at Katie time of admission due to her AKI but was restarted today. She appears euvolemic by examination. - continue Coreg, Hydralazine and Imdur. If she were to have recurrent syncopal events in Katie future, could consider an event monitor at that time to assess for episodes of NSVT but given her atypical presentation and no acute findings on telemetry, would not pursue at this time. Can further address as an outpatient.   3. Paroxysmal Atrial Fibrillation - Rates have overall been well controlled by review of telemetry. Continue Coreg for rate-control.  - She denies any evidence of active bleeding. Remains on Eliquis for anticoagulation.    4. Stage 3 CKD  - Creatinine was elevated to 2.30 at Katie time of admission, trending down to 1.65 today.    For questions or updates, please contact [place] Please consult [email or web address] for contact info under Cardiology/STEMI.  Signed, Erma Heritage, PA-C 05/16/2018, 5:38 PM Pager: [phone]  [name] was seen and examined, and I agree with Katie history, physical exam, assessment and plan as documented above, with modifications as noted below. I have also personally reviewed all relevant documentation, old records, labs, and both radiographic and cardiovascular studies. I have also independently interpreted old and new  ECG's.  Briefly, this is a 82 year old woman with a past medical history significant for chronic combined systolic and diastolic heart failure with a presumed nonischemic cardiomyopathy, paroxysmal atrial fibrillation, hypertension, hyperlipidemia, chronic kidney disease stage III, and prior CVA whom we are evaluating today for syncope.  At Katie time of my evaluation, there are 2 family members present, neither of whom witnessed Katie episode.  [name] herself is unable to provide any relevant history.  She currently denies chest pain, palpitations, and shortness of breath.  Upon review of Katie electronic medical record, she had decreased responsiveness and had shaking of her extremities and her eyes rolled back into her head.  Relevant labs reviewed above.  Head CT was concerning for an acute left cerebellar infarct while MRI of Katie brain showed atrophy and small vessel disease with a hypodensity in Katie left cerebellum which appear to be a chronic finding.  There were multiple areas of chronic infarction.  There was no hemodynamically significant carotid artery stenosis by Dopplers.  ECG showed atrial fibrillation which appear to be rate controlled.  There have been no pauses on telemetry.  An EEG was performed but final results are pending at this time.  [name] repeatedly asked me if she could be discharged.  From a heart failure standpoint, she appears to be compensated.  She is systemically anticoagulated with Eliquis for atrial fibrillation.  At this time, I do not find any particular cardiac etiologies that would explain her event.  Based upon Katie description, there is some concern for a seizure-like episode.  EEG final results are pending at this time.  I do not feel any further cardiac investigations are required at this time.  If she were to have recurrent syncope, I would recommend an event monitor but not at this time.  We will sign off.  Kate Sable, MD,  [facility]  05/16/2018 6:34 PM

## 2018-05-17 ENCOUNTER — Ambulatory Visit: Payer: Medicare Other | Admitting: Student

## 2018-05-17 DIAGNOSIS — R55 Syncope and collapse: Secondary | ICD-10-CM

## 2018-05-17 DIAGNOSIS — N179 Acute kidney failure, unspecified: Secondary | ICD-10-CM | POA: Diagnosis not present

## 2018-05-17 DIAGNOSIS — I482 Chronic atrial fibrillation: Secondary | ICD-10-CM | POA: Diagnosis not present

## 2018-05-17 DIAGNOSIS — I5042 Chronic combined systolic (congestive) and diastolic (congestive) heart failure: Secondary | ICD-10-CM | POA: Diagnosis not present

## 2018-05-17 DIAGNOSIS — I10 Essential (primary) hypertension: Secondary | ICD-10-CM | POA: Diagnosis not present

## 2018-05-17 LAB — GLUCOSE, CAPILLARY
Glucose-Capillary: 133 mg/dL — ABNORMAL HIGH (ref 65–99)
Glucose-Capillary: 101 mg/dL — ABNORMAL HIGH (ref 65–99)

## 2018-05-17 LAB — HOMOCYSTEINE: Homocysteine: 21.9 umol/L — ABNORMAL HIGH (ref 0.0–15.0)

## 2018-05-17 LAB — BASIC METABOLIC PANEL
Anion gap: 9 (ref 5–15)
BUN: 31 mg/dL — ABNORMAL HIGH (ref 6–20)
CO2: 26 mmol/L (ref 22–32)
Calcium: 9.8 mg/dL (ref 8.9–10.3)
Chloride: 106 mmol/L (ref 101–111)
Creatinine, Ser: 1.6 mg/dL — ABNORMAL HIGH (ref 0.44–1.00)
GFR calc Af Amer: 34 mL/min — ABNORMAL LOW (ref >60)
GFR calc non Af Amer: 30 mL/min — ABNORMAL LOW (ref >60)
Glucose, Bld: 121 mg/dL — ABNORMAL HIGH (ref 65–99)
Potassium: 3.9 mmol/L (ref 3.5–5.1)
Sodium: 141 mmol/L (ref 135–145)

## 2018-05-17 LAB — RPR: RPR Ser Ql: NONREACTIVE

## 2018-05-17 NOTE — Discharge Summary (Signed)
Physician Discharge Summary  LINET BRASH QAS:[id number] DOB: 07-05-1936 DOA: 05/14/2018  PCP: Jani Gravel, MD  Admit date: 05/14/2018 Discharge date: 05/17/2018  Admitted From: Home Disposition:  Home   Recommendations for Outpatient Follow-up:  1. Follow up with PCP in 1-2 weeks 2. Please obtain BMP/CBC in one week 3. Please follow up with neurology, Dr. Merlene Laughter in one month    Discharge Condition: Stable CODE STATUS: FULL Diet recommendation: Heart Healthy   Brief/Interim Summary: 82 y.o.femalewith medical history significant forhypothyroidism, type 2 diabetes, hypertension, dyslipidemia, renal artery stenosis, prior CVA, CAD with ischemic cardiomyopathy LVEF 79%, chronic systolic and diastolic CHF, and asthma who was brought to the emergency department via EMS after granddaughter witnessed what appeared to be a syncopal episode earlier this afternoon at approximately 2 PM. She states that the patient noted some shortness of breath when she was walking outside on the porch and within a few seconds of sitting down her eyes rolled back and her head kept jerking back-and-forth. She then went to sleep and by the time she woke up she had some difficulty speaking and appeared to be confused. EMS noted blood glucose of 160mg /dL on the scene.No other symptoms were noted and there were no signs of any urinary or fecal incontinence.    Discharge Diagnoses:  Syncope -Concerned about seizures given clinical history -defer AED therapy to neurology--no need for AEDs presently -05/14/2018 CT brain--initially concerned about acute left cerebellar infarct -MRI brain multiple areas of chronic infarct -EEG--no epileptiform activity -Check orthostatic vital signs--neg -Cardiology consult appreciated--no further work up -neurology consult-->EEG-->no seizure activity--no need to start AEDs -follow up neuro office after d/c -spoke with family and patient--pt does not drive  Chronic atrial  fibrillation -Continue apixaban -Restarted carvedilol -CHADS-VASc = 7  Chronic diastolic and systolic CHF -8/92/1194 echo EF 50%, diffuse HK, grade 2 DD, moderate MR, trivial TR -Daily weights -Appears clinically euvolemic  Acute on chronic renal failure--CKD stage III -Baseline creatinine 1.5-1.8 -due to volume depletion -serum creatinine peaked 2.30 -Held furosemide temporarily initially -restart home lasix  Diabetes mellitus type 2 -04/25/2018 hemoglobin A1c 6.6 -Holding Januvia and Actos--restart after d/c -NovoLog sliding scale  Essential hypertension -Restart carvedilol, hydralazine   Hyperlipidemia -Continue statin  Cognitive impairment -Continue Aricept -Mentation back to baseline       Discharge Instructions   Allergies as of 05/17/2018      Reactions   Yellow Jacket Venom [bee Venom] Shortness Of Breath, Itching      Medication List    STOP taking these medications   famotidine 20 MG tablet Commonly known as:  PEPCID     TAKE these medications   allopurinol 100 MG tablet Commonly known as:  ZYLOPRIM TAKE ONE TABLET BY MOUTH ONCE DAILY FOR GOUT   apixaban 5 MG Tabs tablet Commonly known as:  ELIQUIS Take 1 tablet (5 mg total) by mouth 2 (two) times daily.   atorvastatin 20 MG tablet Commonly known as:  LIPITOR Take 1 tablet (20 mg total) by mouth daily at 6 PM.   carvedilol 12.5 MG tablet Commonly known as:  COREG Take 1 tablet (12.5 mg total) by mouth 2 (two) times daily with a meal.   donepezil 10 MG tablet Commonly known as:  ARICEPT Take 10 mg by mouth daily.   EPINEPHrine 0.3 mg/0.3 mL Soaj injection Commonly known as:  EPI-PEN Inject 0.3 mLs (0.3 mg total) into the muscle once as needed (severe allergic reaction).   furosemide 20 MG tablet Commonly known  as:  LASIX Take 3 tablets (60 mg total) by mouth daily.   gabapentin 100 MG capsule Commonly known as:  NEURONTIN Take 100 mg by mouth 3 (three) times daily.     hydrALAZINE 50 MG tablet Commonly known as:  APRESOLINE Take 1.5 tablets (75 mg total) by mouth every 8 (eight) hours.   isosorbide mononitrate 30 MG 24 hr tablet Commonly known as:  IMDUR Take 1 tablet (30 mg total) by mouth daily.   pioglitazone 15 MG tablet Commonly known as:  ACTOS TAKE ONE TABLET BY MOUTH ONCE DAILY FOR DIABETES   sitaGLIPtin 50 MG tablet Commonly known as:  JANUVIA Take 50 mg by mouth daily.       Allergies  Allergen Reactions  . Yellow Jacket Venom [Bee Venom] Shortness Of Breath and Itching    Consultations:  Cardiology, neurology   Procedures/Studies: Dg Chest 2 View  Result Date: 05/14/2018 CLINICAL DATA:  Altered mental status. EXAM: CHEST - 2 VIEW COMPARISON:  Chest radiograph 04/24/2018 FINDINGS: Stable enlarged cardiac silhouette. Low lung volumes. No effusion, infiltrate pneumothorax. Chronic bronchitic markings noted. No acute osseous abnormality. IMPRESSION: Cardiomegaly without acute cardiopulmonary findings. Electronically Signed   By: Suzy Bouchard M.D.   On: 05/14/2018 18:58   Dg Chest 2 View  Result Date: 04/24/2018 CLINICAL DATA:  Dry cough and shortness of breath today. EXAM: CHEST - 2 VIEW COMPARISON:  PA and lateral chest 10/01/2015 and 11/10/2014. FINDINGS: There is cardiomegaly and mild vascular congestion, unchanged since the most recent exam. Aortic atherosclerosis is noted. No pneumothorax or pleural fluid. No consolidative process. No acute bony abnormality. IMPRESSION: Chronic cardiomegaly and vascular congestion. No acute disease. Atherosclerosis. Electronically Signed   By: Inge Rise M.D.   On: 04/24/2018 17:31   Ct Head Wo Contrast  Result Date: 05/14/2018 CLINICAL DATA:  Altered mental status. EXAM: CT HEAD WITHOUT CONTRAST TECHNIQUE: Contiguous axial images were obtained from the base of the skull through the vertex without intravenous contrast. COMPARISON:  CT head dated June 18, 2014. FINDINGS: Brain: New  hypodensity within the left cerebellum. Old infarct within the left occipital lobe. Progressive periventricular white matter hypodensity along the right frontal horn likely due to chronic microvascular ischemic changes. Stable mild cerebral atrophy. No evidence of hemorrhage, hydrocephalus, extra-axial collection or mass lesion/mass effect. Vascular: Calcified atherosclerosis at the skullbase. No hyperdense vessel. Skull: Negative for fracture or focal lesion. Diffuse calvarial thickening is unchanged. Sinuses/Orbits: No acute finding. Other: None. IMPRESSION: 1. Findings concerning for acute left cerebellar infarct. These results were called by telephone on 05/14/2018 at 7:23 pm to Dr. Merrily Pew , who verbally acknowledged these results. Electronically Signed   By: Titus Dubin M.D.   On: 05/14/2018 19:25   Ct Chest Wo Contrast  Result Date: 04/24/2018 CLINICAL DATA:  Acute onset of productive cough. Decreased O2 saturation. EXAM: CT CHEST WITHOUT CONTRAST TECHNIQUE: Multidetector CT imaging of the chest was performed following the standard protocol without IV contrast. COMPARISON:  Chest radiograph performed earlier today at 5:08 p.m. FINDINGS: Cardiovascular: The heart is borderline normal in size. Scattered calcification is noted along the thoracic aorta and proximal great vessels. Scattered coronary artery calcifications are seen. Mediastinum/Nodes: Azygoesophageal recess nodes measure up to 1.8 cm in short axis. Mild nonspecific bilateral hilar prominence is noted. No pericardial effusion is identified. The thyroid gland is unremarkable. No axillary lymphadenopathy is seen. Lungs/Pleura: Patchy airspace opacification at the superior aspect of the right lower lobe is concerning for pneumonia. Evaluation is suboptimal  due to motion artifact. Underlying atelectasis is noted bilaterally. No pleural effusion or pneumothorax is seen. Underlying vascular congestion is seen. Upper Abdomen: The visualized  portions of the liver and spleen are unremarkable. The patient is status post cholecystectomy, with clips noted at the gallbladder fossa. The visualized portions of the pancreas, adrenal glands and kidneys are within normal limits. Musculoskeletal: No acute osseous abnormalities are identified. The visualized musculature is unremarkable in appearance. IMPRESSION: 1. Patchy airspace opacification at the superior aspect of the right lower lobe is concerning for pneumonia. Mild nonspecific bilateral hilar prominence noted. After completion of treatment for pneumonia, would consider follow-up CT of the chest to ensure resolution of hilar prominence and airspace opacification. 2. Underlying vascular congestion noted. 3. Azygoesophageal recess nodes measure up to 1.8 cm in short axis. This may reflect the underlying infection. 4. Scattered coronary artery calcifications seen. 5. Electronically Signed   By: Garald Balding M.D.   On: 04/24/2018 21:10   Mr Brain Wo Contrast  Result Date: 05/15/2018 CLINICAL DATA:  Altered mental status for 2 days. History of prior stroke. History of hypertension and diabetes mellitus. EXAM: MRI HEAD WITHOUT CONTRAST TECHNIQUE: Multiplanar, multiecho pulse sequences of the brain and surrounding structures were obtained without intravenous contrast. COMPARISON:  CT head 05/14/2018. FINDINGS: Brain: No evidence for acute infarction, hemorrhage, mass lesion, hydrocephalus, or extra-axial fluid. Generalized atrophy. Chronic microvascular ischemic change appears moderate to advanced. Chronic LEFT occipital PCA territory infarct. Chronic LEFT cerebellar AICA territory infarct. Tiny RIGHT cerebellar infarcts of a chronic nature. Chronic deep white matter infarct affects the RIGHT frontal lobe. Vascular: Flow voids are maintained.  LEFT vertebral is dominant. Skull and upper cervical spine: Unremarkable. Sinuses/Orbits: No sinus or mastoid disease. Other: None. IMPRESSION: Atrophy and small vessel  disease with multiple areas of chronic infarction. Specifically, the question hypodensity in the LEFT cerebellum is a chronic finding. Electronically Signed   By: Staci Righter M.D.   On: 05/15/2018 08:10   US Carotid Bilateral  Result Date: 05/15/2018 CLINICAL DATA:  Stroke.  Altered mental status EXAM: BILATERAL CAROTID DUPLEX ULTRASOUND TECHNIQUE: Pearline Cables scale imaging, color Doppler and duplex ultrasound were performed of bilateral carotid and vertebral arteries in the neck. COMPARISON:  03/26/2010 FINDINGS: Criteria: Quantification of carotid stenosis is based on velocity parameters that correlate the residual internal carotid diameter with NASCET-based stenosis levels, using the diameter of the distal internal carotid lumen as the denominator for stenosis measurement. The following velocity measurements were obtained: RIGHT ICA:  80 cm/sec CCA:  81 cm/sec SYSTOLIC ICA/CCA RATIO:  1.0 DIASTOLIC ICA/CCA RATIO: ECA:  54 cm/sec LEFT ICA:  108 cm/sec CCA:  58 cm/sec SYSTOLIC ICA/CCA RATIO:  1.9 DIASTOLIC ICA/CCA RATIO: ECA:  84 cm/sec RIGHT CAROTID ARTERY: Moderate calcified plaque in the bulb. Low resistance internal carotid Doppler pattern is preserved. RIGHT VERTEBRAL ARTERY:  Antegrade. LEFT CAROTID ARTERY: Markedly prominent irregular calcified plaque is present in the bulb. Low resistance internal carotid Doppler pattern is preserved. LEFT VERTEBRAL ARTERY:  Antegrade. IMPRESSION: Less than 50% stenosis in the right and left internal carotid arteries. Electronically Signed   By: Marybelle Killings M.D.   On: 05/15/2018 09:01   Nm Pulmonary Perf And Vent  Result Date: 04/25/2018 CLINICAL DATA:  82 year old female with acute shortness of breath and elevated D-dimer. EXAM: NUCLEAR MEDICINE VENTILATION - PERFUSION LUNG SCAN TECHNIQUE: Ventilation images were obtained in multiple projections using inhaled aerosol Tc-40m DTPA. Perfusion images were obtained in multiple projections after intravenous injection of  Tc-29m-MAA.  RADIOPHARMACEUTICALS:  31.4 mCi of Tc-46m DTPA aerosol inhalation and 4.39 mCi Tc39m-MAA IV COMPARISON:  04/24/2018 noncontrast chest CT FINDINGS: Ventilation: Mild patchy nonsegmental areas of decreased ventilation noted. Perfusion: Mild patchy nonsegmental areas of decreased perfusion matched ventilation defects. No wedge shaped peripheral perfusion defects to suggest acute pulmonary embolism. IMPRESSION: Low probability for pulmonary embolus (less than 20%). Electronically Signed   By: Margarette Canada M.D.   On: 04/25/2018 12:47         Discharge Exam: Vitals:   05/16/18 2157 05/17/18 0634  BP: (!) 163/73 (!) 151/99  Pulse: 83 86  Resp: 18 18  Temp: 98.3 F (36.8 C) 98.6 F (37 C)  SpO2: 96% 97%   Vitals:   05/16/18 1429 05/16/18 2157 05/17/18 0500 05/17/18 0634  BP: (!) 160/96 (!) 163/73  (!) 151/99  Pulse: 77 83  86  Resp: 16 18  18   Temp: (!) 97.5 F (36.4 C) 98.3 F (36.8 C)  98.6 F (37 C)  TempSrc: Oral Oral  Oral  SpO2: 100% 96%  97%  Weight:   87.3 kg (192 lb 6.4 oz)   Height:        General: Pt is alert, awake, not in acute distress Cardiovascular: RRR, S1/S2 +, no rubs, no gallops Respiratory: CTA bilaterally, no wheezing, no rhonchi Abdominal: Soft, NT, ND, bowel sounds + Extremities: no edema, no cyanosis   The results of significant diagnostics from this hospitalization (including imaging, microbiology, ancillary and laboratory) are listed below for reference.    Significant Diagnostic Studies: Dg Chest 2 View  Result Date: 05/14/2018 CLINICAL DATA:  Altered mental status. EXAM: CHEST - 2 VIEW COMPARISON:  Chest radiograph 04/24/2018 FINDINGS: Stable enlarged cardiac silhouette. Low lung volumes. No effusion, infiltrate pneumothorax. Chronic bronchitic markings noted. No acute osseous abnormality. IMPRESSION: Cardiomegaly without acute cardiopulmonary findings. Electronically Signed   By: Suzy Bouchard M.D.   On: 05/14/2018 18:58   Dg Chest 2  View  Result Date: 04/24/2018 CLINICAL DATA:  Dry cough and shortness of breath today. EXAM: CHEST - 2 VIEW COMPARISON:  PA and lateral chest 10/01/2015 and 11/10/2014. FINDINGS: There is cardiomegaly and mild vascular congestion, unchanged since the most recent exam. Aortic atherosclerosis is noted. No pneumothorax or pleural fluid. No consolidative process. No acute bony abnormality. IMPRESSION: Chronic cardiomegaly and vascular congestion. No acute disease. Atherosclerosis. Electronically Signed   By: Inge Rise M.D.   On: 04/24/2018 17:31   Ct Head Wo Contrast  Result Date: 05/14/2018 CLINICAL DATA:  Altered mental status. EXAM: CT HEAD WITHOUT CONTRAST TECHNIQUE: Contiguous axial images were obtained from the base of the skull through the vertex without intravenous contrast. COMPARISON:  CT head dated June 18, 2014. FINDINGS: Brain: New hypodensity within the left cerebellum. Old infarct within the left occipital lobe. Progressive periventricular white matter hypodensity along the right frontal horn likely due to chronic microvascular ischemic changes. Stable mild cerebral atrophy. No evidence of hemorrhage, hydrocephalus, extra-axial collection or mass lesion/mass effect. Vascular: Calcified atherosclerosis at the skullbase. No hyperdense vessel. Skull: Negative for fracture or focal lesion. Diffuse calvarial thickening is unchanged. Sinuses/Orbits: No acute finding. Other: None. IMPRESSION: 1. Findings concerning for acute left cerebellar infarct. These results were called by telephone on 05/14/2018 at 7:23 pm to Dr. Merrily Pew , who verbally acknowledged these results. Electronically Signed   By: Titus Dubin M.D.   On: 05/14/2018 19:25   Ct Chest Wo Contrast  Result Date: 04/24/2018 CLINICAL DATA:  Acute onset of productive  cough. Decreased O2 saturation. EXAM: CT CHEST WITHOUT CONTRAST TECHNIQUE: Multidetector CT imaging of the chest was performed following the standard protocol without IV  contrast. COMPARISON:  Chest radiograph performed earlier today at 5:08 p.m. FINDINGS: Cardiovascular: The heart is borderline normal in size. Scattered calcification is noted along the thoracic aorta and proximal great vessels. Scattered coronary artery calcifications are seen. Mediastinum/Nodes: Azygoesophageal recess nodes measure up to 1.8 cm in short axis. Mild nonspecific bilateral hilar prominence is noted. No pericardial effusion is identified. The thyroid gland is unremarkable. No axillary lymphadenopathy is seen. Lungs/Pleura: Patchy airspace opacification at the superior aspect of the right lower lobe is concerning for pneumonia. Evaluation is suboptimal due to motion artifact. Underlying atelectasis is noted bilaterally. No pleural effusion or pneumothorax is seen. Underlying vascular congestion is seen. Upper Abdomen: The visualized portions of the liver and spleen are unremarkable. The patient is status post cholecystectomy, with clips noted at the gallbladder fossa. The visualized portions of the pancreas, adrenal glands and kidneys are within normal limits. Musculoskeletal: No acute osseous abnormalities are identified. The visualized musculature is unremarkable in appearance. IMPRESSION: 1. Patchy airspace opacification at the superior aspect of the right lower lobe is concerning for pneumonia. Mild nonspecific bilateral hilar prominence noted. After completion of treatment for pneumonia, would consider follow-up CT of the chest to ensure resolution of hilar prominence and airspace opacification. 2. Underlying vascular congestion noted. 3. Azygoesophageal recess nodes measure up to 1.8 cm in short axis. This may reflect the underlying infection. 4. Scattered coronary artery calcifications seen. 5. Electronically Signed   By: Garald Balding M.D.   On: 04/24/2018 21:10   Mr Brain Wo Contrast  Result Date: 05/15/2018 CLINICAL DATA:  Altered mental status for 2 days. History of prior stroke. History  of hypertension and diabetes mellitus. EXAM: MRI HEAD WITHOUT CONTRAST TECHNIQUE: Multiplanar, multiecho pulse sequences of the brain and surrounding structures were obtained without intravenous contrast. COMPARISON:  CT head 05/14/2018. FINDINGS: Brain: No evidence for acute infarction, hemorrhage, mass lesion, hydrocephalus, or extra-axial fluid. Generalized atrophy. Chronic microvascular ischemic change appears moderate to advanced. Chronic LEFT occipital PCA territory infarct. Chronic LEFT cerebellar AICA territory infarct. Tiny RIGHT cerebellar infarcts of a chronic nature. Chronic deep white matter infarct affects the RIGHT frontal lobe. Vascular: Flow voids are maintained.  LEFT vertebral is dominant. Skull and upper cervical spine: Unremarkable. Sinuses/Orbits: No sinus or mastoid disease. Other: None. IMPRESSION: Atrophy and small vessel disease with multiple areas of chronic infarction. Specifically, the question hypodensity in the LEFT cerebellum is a chronic finding. Electronically Signed   By: Staci Righter M.D.   On: 05/15/2018 08:10   US Carotid Bilateral  Result Date: 05/15/2018 CLINICAL DATA:  Stroke.  Altered mental status EXAM: BILATERAL CAROTID DUPLEX ULTRASOUND TECHNIQUE: Pearline Cables scale imaging, color Doppler and duplex ultrasound were performed of bilateral carotid and vertebral arteries in the neck. COMPARISON:  03/26/2010 FINDINGS: Criteria: Quantification of carotid stenosis is based on velocity parameters that correlate the residual internal carotid diameter with NASCET-based stenosis levels, using the diameter of the distal internal carotid lumen as the denominator for stenosis measurement. The following velocity measurements were obtained: RIGHT ICA:  80 cm/sec CCA:  81 cm/sec SYSTOLIC ICA/CCA RATIO:  1.0 DIASTOLIC ICA/CCA RATIO: ECA:  54 cm/sec LEFT ICA:  108 cm/sec CCA:  58 cm/sec SYSTOLIC ICA/CCA RATIO:  1.9 DIASTOLIC ICA/CCA RATIO: ECA:  84 cm/sec RIGHT CAROTID ARTERY: Moderate  calcified plaque in the bulb. Low resistance internal carotid Doppler pattern is  preserved. RIGHT VERTEBRAL ARTERY:  Antegrade. LEFT CAROTID ARTERY: Markedly prominent irregular calcified plaque is present in the bulb. Low resistance internal carotid Doppler pattern is preserved. LEFT VERTEBRAL ARTERY:  Antegrade. IMPRESSION: Less than 50% stenosis in the right and left internal carotid arteries. Electronically Signed   By: Marybelle Killings M.D.   On: 05/15/2018 09:01   Nm Pulmonary Perf And Vent  Result Date: 04/25/2018 CLINICAL DATA:  82 year old female with acute shortness of breath and elevated D-dimer. EXAM: NUCLEAR MEDICINE VENTILATION - PERFUSION LUNG SCAN TECHNIQUE: Ventilation images were obtained in multiple projections using inhaled aerosol Tc-2m DTPA. Perfusion images were obtained in multiple projections after intravenous injection of Tc-56m-MAA. RADIOPHARMACEUTICALS:  31.4 mCi of Tc-53m DTPA aerosol inhalation and 4.39 mCi Tc40m-MAA IV COMPARISON:  04/24/2018 noncontrast chest CT FINDINGS: Ventilation: Mild patchy nonsegmental areas of decreased ventilation noted. Perfusion: Mild patchy nonsegmental areas of decreased perfusion matched ventilation defects. No wedge shaped peripheral perfusion defects to suggest acute pulmonary embolism. IMPRESSION: Low probability for pulmonary embolus (less than 20%). Electronically Signed   By: Margarette Canada M.D.   On: 04/25/2018 12:47     Microbiology: No results found for this or any previous visit (from the past 240 hour(s)).   Labs: Basic Metabolic Panel: Recent Labs  Lab 05/14/18 1659 05/15/18 0602 05/16/18 0602 05/17/18 0623  NA 141 145 142 141  K 3.8 3.8 4.3 3.9  CL 108 110 107 106  CO2 24 28 28 26   GLUCOSE 107* 110* 102* 121*  BUN 31* 34* 30* 31*  CREATININE 2.30* 2.17* 1.65* 1.60*  CALCIUM 9.1 9.0 9.5 9.8   Liver Function Tests: Recent Labs  Lab 05/14/18 1659  AST 12*  ALT 8*  ALKPHOS 100  BILITOT 0.4  PROT 6.9  ALBUMIN 3.6    No results for input(s): LIPASE, AMYLASE in the last 168 hours. No results for input(s): AMMONIA in the last 168 hours. CBC: Recent Labs  Lab 05/14/18 1659 05/15/18 0602 05/16/18 0602  WBC 6.7 5.8 5.7  NEUTROABS 4.4  --   --   HGB 10.9* 10.9* 11.1*  HCT 36.4 37.1 37.1  MCV 87.9 89.6 89.2  PLT 213 198 204   Cardiac Enzymes: Recent Labs  Lab 05/14/18 1659  TROPONINI <0.03   BNP: Invalid input(s): POCBNP CBG: Recent Labs  Lab 05/16/18 1058 05/16/18 1541 05/16/18 2155 05/17/18 0737 05/17/18 1119  GLUCAP 110* 128* 144* 133* 101*    Time coordinating discharge:  36 minutes  Signed:  Orson Eva, DO [facility] Pager: [phone] 05/17/2018, 11:30 AM

## 2018-05-17 NOTE — Progress Notes (Signed)
Patient discharged home today per MD orders. Patient vital signs WDL.  Discharge Instructions including follow up appointments, medications, and education reviewed with patient. Patient verbalizes understanding. Patient is transported out via wheelchair.  

## 2018-06-10 NOTE — Progress Notes (Signed)
Cardiology Office Note    Date:  06/11/2018   ID:  [name], DOB [birth year], MRN [id number]  PCP:  Katie Gravel, MD  Cardiologist: Rozann Lesches, MD    Chief Complaint  Patient presents with  . Hospitalization Follow-up    History of Present Illness:    [name] is a 82 y.o. female with past medical history of chronic combined systolic and diastolic CHF, presumed nonischemic cardiomyopathy (EF 20 to 25% by echocardiogram in 2016, 30% by echo in 04/2018), paroxysmal atrial fibrillation, HTN, HLD, Stage 3 CKD and prior CVA who presents to the office today for hospital follow-up.  She was recently admitted to [facility] from 05/14/2018 to 05/17/2018 for evaluation of syncope.  Family reported that she had been less responsive and had experienced shaking along her extremities and her eyes rolled back in her head on the day of admission.  Labs showed she did have an AKI with creatinine elevated to 2.30 (baseline 1.2 - 1.4). Initial troponin value was negative and BNP was slightly elevated at 328. A CT head was performed and was concerning for any acute left cerebellar infarct but her MRI showed atrophy and small vessel disease with no acute findings. Telemetry did show intermittent episodes of atrial fibrillation but her heart rate was overall well controlled in the 70's to 80's with no significant pauses noted. No further cardiac testing was pursued at that time.  In talking with the patient today, most history is provided by her daughter who is her primary caretaker. She reports overall doing well since her recent hospitalization and has not experienced any recurrent syncopal events. Says that weight has overall been stable on her home scales and she has remained on Lasix 40 mg in AM/ 20mg  in PM. She is followed by Dr. Lowanda Foster for CKD.   She has chronic dyspnea on exertion but has not experienced any recent changes in this.  No recent chest pain, palpitations, orthopnea, or lower  extremity edema.  Her daughter does report her urine has been foul smelling and the patient has been experiencing increased fatigue over the past several days. Has also been going to the restroom more frequently.    Past Medical History:  Diagnosis Date  . Asthma   . Bronchitis   . Carotid artery disease ([place])   . Coronary atherosclerosis of native coronary artery    Nonobstructive at cath 2005  . DM2 (diabetes mellitus, type 2) ([place])   . Essential hypertension, benign   . Gout   . History of stroke   . Hypothyroidism   . Influenza 2013  . Nonischemic cardiomyopathy (HCC)    LVEF 20-25%  . PAF (paroxysmal atrial fibrillation) ([place])    a. diagnosed in 04/2018. Started on Eliquis for anticoagulation.   . Renal artery stenosis (HCC)    Bilateral  . Stroke [facility])     Past Surgical History:  Procedure Laterality Date  . ABDOMINAL HYSTERECTOMY      Current Medications: Outpatient Medications Prior to Visit  Medication Sig Dispense Refill  . allopurinol (ZYLOPRIM) 100 MG tablet TAKE ONE TABLET BY MOUTH ONCE DAILY FOR GOUT  3  . apixaban (ELIQUIS) 5 MG TABS tablet Take 1 tablet (5 mg total) by mouth 2 (two) times daily. 60 tablet 1  . atorvastatin (LIPITOR) 20 MG tablet Take 1 tablet (20 mg total) by mouth daily at 6 PM. 30 tablet 1  . carvedilol (COREG) 12.5 MG tablet Take 1 tablet (12.5 mg total)  by mouth 2 (two) times daily with a meal. 60 tablet 1  . donepezil (ARICEPT) 10 MG tablet Take 10 mg by mouth daily.    Marland Kitchen EPINEPHrine 0.3 mg/0.3 mL IJ SOAJ injection Inject 0.3 mLs (0.3 mg total) into the muscle once as needed (severe allergic reaction). 1 Device 0  . furosemide (LASIX) 20 MG tablet Take 3 tablets (60 mg total) by mouth daily. 90 tablet 1  . gabapentin (NEURONTIN) 100 MG capsule Take 100 mg by mouth 3 (three) times daily.     . hydrALAZINE (APRESOLINE) 50 MG tablet Take 1.5 tablets (75 mg total) by mouth every 8 (eight) hours. 180 tablet 1  . isosorbide mononitrate  (IMDUR) 30 MG 24 hr tablet Take 1 tablet (30 mg total) by mouth daily. 30 tablet 1  . pioglitazone (ACTOS) 15 MG tablet TAKE ONE TABLET BY MOUTH ONCE DAILY FOR DIABETES  12  . sitaGLIPtin (JANUVIA) 50 MG tablet Take 50 mg by mouth daily.     No facility-administered medications prior to visit.      Allergies:   Yellow jacket venom [bee venom]   Social History   Socioeconomic History  . Marital status: Single    Spouse name: Not on file  . Number of children: 14  . Years of education: Not on file  . Highest education level: Not on file  Occupational History  . Occupation: Retired  Scientific laboratory technician  . Financial resource strain: Not on file  . Food insecurity:    Worry: Not on file    Inability: Not on file  . Transportation needs:    Medical: Not on file    Non-medical: Not on file  Tobacco Use  . Smoking status: Never Smoker  . Smokeless tobacco: Never Used  Substance and Sexual Activity  . Alcohol use: No    Alcohol/week: 0.0 oz  . Drug use: No  . Sexual activity: Never    Birth control/protection: Surgical  Lifestyle  . Physical activity:    Days per week: Not on file    Minutes per session: Not on file  . Stress: Not on file  Relationships  . Social connections:    Talks on phone: Not on file    Gets together: Not on file    Attends religious service: Not on file    Active member of club or organization: Not on file    Attends meetings of clubs or organizations: Not on file    Relationship status: Not on file  Other Topics Concern  . Not on file  Social History Narrative  . Not on file     Family History:  The patient's family history includes Asthma in her son.   Review of Systems:   Please see the history of present illness.     General:  No chills, fever, night sweats or weight changes. Positive for fatigue.  Cardiovascular:  No chest pain, edema, orthopnea, palpitations, paroxysmal nocturnal dyspnea. Positive for dyspnea on exertion.  Dermatological: No  rash, lesions/masses Respiratory: No cough, dyspnea Urologic: No hematuria. Positive for dysuria. Abdominal:   No nausea, vomiting, diarrhea, bright red blood per rectum, melena, or hematemesis Neurologic:  No visual changes, wkns, changes in mental status. All other systems reviewed and are otherwise negative except as noted above.   Physical Exam:    VS:  BP 132/68 (BP Location: Left Arm)   Pulse 78   Ht 5\' 3"  (1.6 m)   Wt 195 lb (88.5 kg)   SpO2  91%   BMI 34.54 kg/m    General: Well developed, elderly African American female appearing in no acute distress. Head: Normocephalic, atraumatic, sclera non-icteric, no xanthomas, nares are without discharge.  Neck: No carotid bruits. JVD not elevated.  Lungs: Respirations regular and unlabored, without wheezes or rales.  Heart: Irregularly irregular. No S3 or S4.  No murmur, no rubs, or gallops appreciated. Abdomen: Soft, non-tender, non-distended with normoactive bowel sounds. No hepatomegaly. No rebound/guarding. No obvious abdominal masses. Msk:  Strength and tone appear normal for age. No joint deformities or effusions. Extremities: No clubbing or cyanosis. Trace ankle edema.  Distal pedal pulses are 2+ bilaterally. Neuro: Alert and oriented X 3. Moves all extremities spontaneously. No focal deficits noted. Psych:  Responds to questions appropriately with a normal affect. Skin: No rashes or lesions noted  Wt Readings from Last 3 Encounters:  06/11/18 195 lb (88.5 kg)  05/17/18 192 lb 6.4 oz (87.3 kg)  04/27/18 198 lb 6.6 oz (90 kg)      Studies/Labs Reviewed:   EKG:  EKG is not ordered today.   Recent Labs: 04/24/2018: TSH 2.515 05/14/2018: ALT 8; B Natriuretic Peptide 328.0 05/16/2018: Hemoglobin 11.1; Platelets 204 05/17/2018: BUN 31; Creatinine, Ser 1.60; Potassium 3.9; Sodium 141   Lipid Panel    Component Value Date/Time   CHOL 183 04/25/2018 0310   TRIG 69 04/25/2018 0310   HDL 52 04/25/2018 0310   CHOLHDL 3.5  04/25/2018 0310   VLDL 14 04/25/2018 0310   LDLCALC 117 (H) 04/25/2018 0310    Additional studies/ records that were reviewed today include:   Echocardiogram: 04/25/2018 Study Conclusions  - Left ventricle: The cavity size was normal. Wall thickness was   increased in a pattern of moderate LVH. Systolic function was   severely reduced. The estimated ejection fraction was 30%.   Diffuse hypokinesis, most prominent basal inferolateral wall.   Features are consistent with a pseudonormal left ventricular   filling pattern, with concomitant abnormal relaxation and   increased filling pressure (grade 2 diastolic dysfunction). - Aortic valve: Mildly calcified annulus. Trileaflet. - Mitral valve: Mildly thickened leaflets. There was moderate   regurgitation. - Left atrium: The atrium was mildly dilated. - Right atrium: Central venous pressure (est): 3 mm Hg. - Tricuspid valve: There was trivial regurgitation. - Pulmonary arteries: PA peak pressure: 37 mm Hg (S). - Pericardium, extracardiac: There was no pericardial effusion.  Carotid Dopplers: 05/15/2018 IMPRESSION: Less than 50% stenosis in the right and left internal carotid arteries.  Assessment:    1. Chronic combined systolic (congestive) and diastolic (congestive) heart failure ([place])   2. PAF (paroxysmal atrial fibrillation) ([place])   3. Essential hypertension   4. Hyperlipidemia, unspecified hyperlipidemia type   5. Dysuria   6. CKD (chronic kidney disease) stage 3, GFR 30-59 ml/min (HCC)      Plan:   In order of problems listed above:  1. Chronic Combined Systolic and Diastolic CHF - she has a known reduced EF of 30% by echo in 04/2018. Was recently admitted for a syncopal event during which she had shaking along her extremities and her eyes rolled back in her head. Overall felt to be most consistent with a neurologic etiology. Lasix was held given her AKI but resumed prior to discharge.  - she has baseline dyspnea on  exertion but has not experienced any recent changes in this. No recent chest pain, palpitations, orthopnea, or lower extremity edema. Overall, appears euvolemic on examination today.  - would continue  on Lasix at 40mg  in AM/20mg  in PM. Can take an additional 20mg  for weight gain > 3 lbs overnight or > 5 lbs in one week. Sodium and fluid restriction reviewed.  - continue BB, Hydralazine and Imdur. Not on ACE-I/ARB/ARNI given her variable kidney function.   2. Paroxysmal Atrial Fibrillation - she denies any recent palpitations and HR remains well-controlled in the 70's during today's visit. No significant pauses noted on telemetry during her recent admission. - continue Coreg for rate-control and Eliquis 5mg  BID for anticoagulation (only indication for reduced dosing at this time is creatinine).   3. HTN - BP is well-controlled at 132/68 during today's visit. - continue Coreg 12.5mg  BID, Hydralazine 75mg  TID, and Imdur 30mg  daily.   4. HLD - followed by PCP. Remains on Atorvastatin 20mg  daily.   5. Dysuria - patient's daughter reports her urine has been foul smelling. Has also experienced increased frequency and fatigue over the past 2-3 days. Will check a UA today as her daughter reports she develops UTI's frequently.   6. Stage 3 CKD - baseline creatinine 1.2 - 1.4. Elevated to 2.30 during recent admission but improved to 1.60 on 05/17/2018. Followed by Dr. Lowanda Foster.    Medication Adjustments/Labs and Tests Ordered: Current medicines are reviewed at length with the patient today.  Concerns regarding medicines are outlined above.  Medication changes, Labs and Tests ordered today are listed in the Patient Instructions below. Patient Instructions  Medication Instructions:  Your physician recommends that you continue on your current medications as directed. Please refer to the Current Medication list given to you today.  Labwork: TODAY   Testing/Procedures: NONE  Follow-Up: Your  physician recommends that you schedule a follow-up appointment in: 3 MONTHS    Any Other Special Instructions Will Be Listed Below (If Applicable).   If you need a refill on your cardiac medications before your next appointment, please call your pharmacy.  Limit daily fluid intake to less than 2 Liters per day. Please limit salt intake.  Please weight yourself every morning. Take an extra Lasix tablet (20mg ) if weight increases by 3 pounds overnight or 5 pounds in a single week.    Signed, Erma Heritage, PA-C  06/11/2018 7:46 PM    [facility] [address] [place], [place] [address] Phone: [phone]

## 2018-06-11 ENCOUNTER — Other Ambulatory Visit (HOSPITAL_COMMUNITY)
Admission: RE | Admit: 2018-06-11 | Discharge: 2018-06-11 | Disposition: A | Discharge status: Home / Self Care | Payer: Medicare Other | Source: Ambulatory Visit | Attending: Student | Admitting: Student

## 2018-06-11 ENCOUNTER — Ambulatory Visit (INDEPENDENT_AMBULATORY_CARE_PROVIDER_SITE_OTHER): Payer: Medicare Other | Admitting: Student

## 2018-06-11 ENCOUNTER — Encounter: Payer: Self-pay | Admitting: Student

## 2018-06-11 ENCOUNTER — Telehealth: Payer: Self-pay

## 2018-06-11 VITALS — BP 132/68 | HR 78 | Ht 63.0 in | Wt 195.0 lb

## 2018-06-11 DIAGNOSIS — I5042 Chronic combined systolic (congestive) and diastolic (congestive) heart failure: Secondary | ICD-10-CM

## 2018-06-11 DIAGNOSIS — I480 Paroxysmal atrial fibrillation: Secondary | ICD-10-CM | POA: Diagnosis not present

## 2018-06-11 DIAGNOSIS — N183 Chronic kidney disease, stage 3 unspecified: Secondary | ICD-10-CM

## 2018-06-11 DIAGNOSIS — I10 Essential (primary) hypertension: Secondary | ICD-10-CM

## 2018-06-11 DIAGNOSIS — E785 Hyperlipidemia, unspecified: Secondary | ICD-10-CM

## 2018-06-11 DIAGNOSIS — R300 Dysuria: Secondary | ICD-10-CM | POA: Diagnosis not present

## 2018-06-11 DIAGNOSIS — Z87440 Personal history of urinary (tract) infections: Secondary | ICD-10-CM | POA: Diagnosis present

## 2018-06-11 LAB — URINALYSIS, ROUTINE W REFLEX MICROSCOPIC
Bilirubin Urine: NEGATIVE
Glucose, UA: NEGATIVE mg/dL
Hgb urine dipstick: NEGATIVE
Ketones, ur: NEGATIVE mg/dL
Nitrite: POSITIVE — AB
Protein, ur: NEGATIVE mg/dL
Specific Gravity, Urine: 1.013 (ref 1.005–1.030)
pH: 5 (ref 5.0–8.0)

## 2018-06-11 MED ORDER — CIPROFLOXACIN HCL 250 MG PO TABS: 250.0000 mg | ORAL_TABLET | Freq: Two times a day (BID) | ORAL | 0 refills | Status: AC

## 2018-06-11 NOTE — Patient Instructions (Addendum)
Medication Instructions:  Your physician recommends that you continue on your current medications as directed. Please refer to the Current Medication list given to you today.   Labwork: TODAY   Testing/Procedures: NONE  Follow-Up: Your physician recommends that you schedule a follow-up appointment in: 3 MONTHS    Any Other Special Instructions Will Be Listed Below (If Applicable).     If you need a refill on your cardiac medications before your next appointment, please call your pharmacy.    Limit daily fluid intake to less than 2 Liters per day. Please limit salt intake.  Please weight yourself every morning. Take an extra Lasix tablet (20mg ) if weight increases by 3 pounds overnight or 5 pounds in a single week.

## 2018-06-11 NOTE — Telephone Encounter (Signed)
-----   Message from Erma Heritage, [place] sent at 06/11/2018  4:44 PM EDT ----- Please let the patient's daughter know that her UA was consistent with a UTI. If no antibiotic allergies, would send in Rx for Ciprofloxacin 250mg  BID for 3 days. If symptoms do not resolve, should follow-up with her PCP. Thank you.

## 2018-06-11 NOTE — Telephone Encounter (Signed)
Pt's daughter made aware. Sent in rx to [facility].

## 2018-06-21 ENCOUNTER — Encounter

## 2018-06-21 ENCOUNTER — Ambulatory Visit: Payer: Medicare Other | Admitting: Student

## 2018-06-28 ENCOUNTER — Other Ambulatory Visit: Payer: Self-pay | Admitting: Cardiology

## 2018-06-28 ENCOUNTER — Other Ambulatory Visit: Payer: Self-pay

## 2018-06-28 MED ORDER — CARVEDILOL 12.5 MG PO TABS: 12.5000 mg | ORAL_TABLET | Freq: Two times a day (BID) | ORAL | 1 refills | Status: DC

## 2018-06-29 ENCOUNTER — Other Ambulatory Visit: Payer: Self-pay | Admitting: Cardiology

## 2018-06-29 MED ORDER — ISOSORBIDE MONONITRATE ER 30 MG PO TB24: 30.0000 mg | ORAL_TABLET | Freq: Every day | ORAL | 3 refills | Status: DC

## 2018-06-29 MED ORDER — ISOSORBIDE MONONITRATE ER 30 MG PO TB24: 30.0000 mg | ORAL_TABLET | Freq: Every day | ORAL | 6 refills | Status: DC

## 2018-06-29 NOTE — Telephone Encounter (Signed)
Refilled imdur per fax request

## 2018-06-29 NOTE — Telephone Encounter (Signed)
°*  STAT* If patient is at the pharmacy, call can be transferred to refill team.   1. Which medications need to be refilled? (please list name of each medication and dose if known)  isosorbide mononitrate (IMDUR) 30 MG 24 hr tablet [[id number]]   2. Which pharmacy/location (including street and city if local pharmacy) is medication to be sent to? [facility] [address]   3. Do they need a 30 ay or 90 day supply?  90 day

## 2018-07-09 ENCOUNTER — Other Ambulatory Visit: Payer: Self-pay | Admitting: Cardiology

## 2018-07-09 MED ORDER — ATORVASTATIN CALCIUM 20 MG PO TABS: 20.0000 mg | ORAL_TABLET | Freq: Every day | ORAL | 6 refills | Status: DC

## 2018-07-09 MED ORDER — APIXABAN 5 MG PO TABS: 5.0000 mg | ORAL_TABLET | Freq: Two times a day (BID) | ORAL | 6 refills | Status: DC

## 2018-07-09 NOTE — Telephone Encounter (Signed)
Needs refills on Eliquis 5mg  and Atorvastatin 20mg  90 day supply sent to [facility] / [facility]

## 2018-08-11 ENCOUNTER — Other Ambulatory Visit: Payer: Self-pay | Admitting: Cardiology

## 2018-09-12 NOTE — Progress Notes (Signed)
Cardiology Office Note  Date: 09/13/2018   ID: [name], DOB 26-Dec-1935, MRN [id number]  PCP: Jani Gravel, MD  Primary Cardiologist: Rozann Lesches, MD   Chief Complaint  Patient presents with  . Cardiomyopathy  . Atrial Fibrillation    History of Present Illness: [name] is a 82 y.o. female last seen in July by Ms. Strader PA-C.  She is here today with a family member for routine follow-up.  Dementia limits her history, but she does not indicate any obvious change since last visit.  Her weight has been stable, actually down a few pounds.  She has chronic dyspnea on exertion, no obvious chest pain or palpitations however.  I reviewed her cardiac medications which are listed below.  She does not report any bleeding on Eliquis.  I reviewed her lab work from June.  She continues to follow with Dr. Maudie Mercury.  Current cardiac regimen includes Eliquis, Coreg, Lipitor, Lasix, hydralazine, and Imdur.  She has renal insufficiency with a creatinine of 1.6.  We are managing her cardiomyopathy conservatively.  Past Medical History:  Diagnosis Date  . Asthma   . Bronchitis   . Carotid artery disease ([place])   . Coronary atherosclerosis of native coronary artery    Nonobstructive at cath 2005  . DM2 (diabetes mellitus, type 2) ([place])   . Essential hypertension, benign   . Gout   . History of stroke   . Hypothyroidism   . Influenza 2013  . Nonischemic cardiomyopathy (HCC)    LVEF 20-25%  . PAF (paroxysmal atrial fibrillation) ([place])    a. diagnosed in 04/2018. Started on Eliquis for anticoagulation.   . Renal artery stenosis (HCC)    Bilateral    Past Surgical History:  Procedure Laterality Date  . ABDOMINAL HYSTERECTOMY      Current Outpatient Medications  Medication Sig Dispense Refill  . allopurinol (ZYLOPRIM) 100 MG tablet TAKE ONE TABLET BY MOUTH ONCE DAILY FOR GOUT  3  . apixaban (ELIQUIS) 5 MG TABS tablet Take 1 tablet (5 mg total) by mouth 2 (two) times daily. 60  tablet 6  . atorvastatin (LIPITOR) 20 MG tablet Take 1 tablet (20 mg total) by mouth daily at 6 PM. 30 tablet 6  . carvedilol (COREG) 12.5 MG tablet TAKE 1 TABLET(12.5 MG) BY MOUTH TWICE DAILY WITH A MEAL 60 tablet 1  . donepezil (ARICEPT) 10 MG tablet Take 10 mg by mouth daily.    Marland Kitchen EPINEPHrine 0.3 mg/0.3 mL IJ SOAJ injection Inject 0.3 mLs (0.3 mg total) into the muscle once as needed (severe allergic reaction). 1 Device 0  . furosemide (LASIX) 20 MG tablet Take 3 tablets (60 mg total) by mouth daily. 90 tablet 1  . gabapentin (NEURONTIN) 100 MG capsule Take 100 mg by mouth 3 (three) times daily.     . hydrALAZINE (APRESOLINE) 50 MG tablet Take 1.5 tablets (75 mg total) by mouth every 8 (eight) hours. 180 tablet 1  . isosorbide mononitrate (IMDUR) 30 MG 24 hr tablet Take 1 tablet (30 mg total) by mouth daily. 90 tablet 3  . pioglitazone (ACTOS) 15 MG tablet TAKE ONE TABLET BY MOUTH ONCE DAILY FOR DIABETES  12  . sitaGLIPtin (JANUVIA) 50 MG tablet Take 50 mg by mouth daily.     No current facility-administered medications for this visit.    Allergies:  Yellow jacket venom [bee venom]   Social History: The patient  reports that she has never smoked. She has never used smokeless  tobacco. She reports that she does not drink alcohol or use drugs.   ROS:  Please see the history of present illness. Otherwise, complete review of systems is positive for none.  All other systems are reviewed and negative.   Physical Exam: VS:  BP (!) 150/74 (BP Location: Left Arm)   Pulse 79   Ht 5' (1.524 m)   Wt 192 lb (87.1 kg)   SpO2 92%   BMI 37.50 kg/m , BMI Body mass index is 37.5 kg/m.  Wt Readings from Last 3 Encounters:  09/13/18 192 lb (87.1 kg)  06/11/18 195 lb (88.5 kg)  05/17/18 192 lb 6.4 oz (87.3 kg)    General: Elderly woman, appears comfortable at rest. HEENT: Conjunctiva and lids normal, oropharynx clear. Neck: Supple, no elevated JVP or carotid bruits, no thyromegaly. Lungs: Clear to  auscultation, nonlabored breathing at rest. Cardiac: Regular rate and rhythm with occasional ectopic beat, no S3, soft systolic murmur, no pericardial rub. Abdomen: Soft, nontender, bowel sounds present. Extremities: Trace ankle edema, distal pulses 2+. Skin: Warm and dry. Musculoskeletal: No kyphosis. Neuropsychiatric: Alert and oriented x3, affect grossly appropriate.  ECG: I personally reviewed the tracing from 05/14/2018 which showed atrial fibrillation with right bundle branch block and nonspecific ST changes.  Recent Labwork: 04/24/2018: TSH 2.515 05/14/2018: ALT 8; AST 12; B Natriuretic Peptide 328.0 05/16/2018: Hemoglobin 11.1; Platelets 204 05/17/2018: BUN 31; Creatinine, Ser 1.60; Potassium 3.9; Sodium 141     Component Value Date/Time   CHOL 183 04/25/2018 0310   TRIG 69 04/25/2018 0310   HDL 52 04/25/2018 0310   CHOLHDL 3.5 04/25/2018 0310   VLDL 14 04/25/2018 0310   LDLCALC 117 (H) 04/25/2018 0310    Other Studies Reviewed Today:  Echocardiogram 04/25/2018: Study Conclusions  - Left ventricle: The cavity size was normal. Wall thickness was   increased in a pattern of moderate LVH. Systolic function was   severely reduced. The estimated ejection fraction was 30%.   Diffuse hypokinesis, most prominent basal inferolateral wall.   Features are consistent with a pseudonormal left ventricular   filling pattern, with concomitant abnormal relaxation and   increased filling pressure (grade 2 diastolic dysfunction). - Aortic valve: Mildly calcified annulus. Trileaflet. - Mitral valve: Mildly thickened leaflets. There was moderate   regurgitation. - Left atrium: The atrium was mildly dilated. - Right atrium: Central venous pressure (est): 3 mm Hg. - Tricuspid valve: There was trivial regurgitation. - Pulmonary arteries: PA peak pressure: 37 mm Hg (S). - Pericardium, extracardiac: There was no pericardial effusion.  Carotid Dopplers 05/15/2018: IMPRESSION: Less than 50% stenosis  in the right and left internal carotid arteries.  Assessment and Plan:  1.  Chronic systolic heart failure.  Her weight is down a few pounds and she is otherwise clinically stable on current diuretic dosing.  2.  Nonischemic cardiomyopathy with LVEF approximately 30%.  Continue medical therapy, no plan for further invasive assessment or ICD.  She is presently on Coreg, hydralazine, indoor, and Lasix.  3.  Paroxysmal atrial fibrillation, maintaining sinus rhythm at this time and on Eliquis for stroke prophylaxis.  Lab work from June reviewed above.  4.  Essential hypertension, continue with current regimen and keep follow-up with Dr. Maudie Mercury.  Current medicines were reviewed with the patient today.  Disposition: Follow-up in 4 months.  Signed, Satira Sark, MD, [facility] 09/13/2018 11:26 AM    [place] [facility]. [address], [place], [place] [address] Phone: [phone];  Fax: [phone]

## 2018-09-13 ENCOUNTER — Ambulatory Visit (INDEPENDENT_AMBULATORY_CARE_PROVIDER_SITE_OTHER): Payer: Medicare Other | Admitting: Cardiology

## 2018-09-13 ENCOUNTER — Encounter: Payer: Self-pay | Admitting: Cardiology

## 2018-09-13 VITALS — BP 150/74 | HR 79 | Ht 60.0 in | Wt 192.0 lb

## 2018-09-13 DIAGNOSIS — I428 Other cardiomyopathies: Secondary | ICD-10-CM | POA: Diagnosis not present

## 2018-09-13 DIAGNOSIS — I5042 Chronic combined systolic (congestive) and diastolic (congestive) heart failure: Secondary | ICD-10-CM | POA: Diagnosis not present

## 2018-09-13 DIAGNOSIS — I10 Essential (primary) hypertension: Secondary | ICD-10-CM

## 2018-09-13 DIAGNOSIS — I480 Paroxysmal atrial fibrillation: Secondary | ICD-10-CM | POA: Diagnosis not present

## 2018-09-13 NOTE — Patient Instructions (Addendum)
Your physician wants you to follow-up in: 4 months with Dr.McDowell     Your physician recommends that you continue on your current medications as directed. Please refer to the Current Medication list given to you today.   If you need a refill on your cardiac medications before your next appointment, please call your pharmacy.      No lab work or tests ordered today       Thank you for choosing [facility] !

## 2018-10-16 ENCOUNTER — Other Ambulatory Visit: Payer: Self-pay | Admitting: Cardiology

## 2018-10-19 ENCOUNTER — Other Ambulatory Visit (HOSPITAL_COMMUNITY): Payer: Self-pay | Admitting: Family Medicine

## 2018-10-19 ENCOUNTER — Ambulatory Visit (HOSPITAL_COMMUNITY)
Admission: RE | Admit: 2018-10-19 | Discharge: 2018-10-19 | Disposition: A | Discharge status: Home / Self Care | Payer: Medicare Other | Source: Ambulatory Visit | Attending: Family Medicine | Admitting: Family Medicine

## 2018-10-19 DIAGNOSIS — W19XXXD Unspecified fall, subsequent encounter: Secondary | ICD-10-CM

## 2018-10-19 DIAGNOSIS — W19XXXA Unspecified fall, initial encounter: Secondary | ICD-10-CM | POA: Diagnosis not present

## 2018-10-19 DIAGNOSIS — M25532 Pain in left wrist: Secondary | ICD-10-CM | POA: Insufficient documentation

## 2018-10-19 DIAGNOSIS — M79642 Pain in left hand: Secondary | ICD-10-CM | POA: Diagnosis present

## 2019-01-03 ENCOUNTER — Ambulatory Visit (HOSPITAL_COMMUNITY)
Admission: RE | Admit: 2019-01-03 | Discharge: 2019-01-03 | Disposition: A | Discharge status: Home / Self Care | Payer: Medicare Other | Source: Ambulatory Visit | Attending: Adult Health | Admitting: Adult Health

## 2019-01-03 ENCOUNTER — Other Ambulatory Visit (HOSPITAL_COMMUNITY): Payer: Self-pay | Admitting: General Practice

## 2019-01-03 DIAGNOSIS — R05 Cough: Secondary | ICD-10-CM | POA: Insufficient documentation

## 2019-01-03 DIAGNOSIS — R059 Cough, unspecified: Secondary | ICD-10-CM

## 2019-01-09 NOTE — Patient Instructions (Signed)
Your procedure is scheduled on: 01/18/2019  Report to [facility] at  745  AM.  Call this number if you have problems the morning of surgery: [phone]   Do not eat food or drink liquids :After Midnight.      Take these medicines the morning of surgery with A SIP OF WATER: allopurinol, carvedilol,aricept, neurontin, hydralazine, isosorbide.   Do not wear jewelry, make-up or nail polish.  Do not wear lotions, powders, or perfumes. You may wear deodorant.  Do not shave 48 hours prior to surgery.  Do not bring valuables to the hospital.  Contacts, dentures or bridgework may not be worn into surgery.  Leave suitcase in the car. After surgery it may be brought to your room.  For patients admitted to the hospital, checkout time is 11:00 AM the day of discharge.   Patients discharged the day of surgery will not be allowed to drive home.  :     Please read over the following fact sheets that you were given: Coughing and Deep Breathing, Surgical Site Infection Prevention, Anesthesia Post-op Instructions and Care and Recovery After Surgery    Cataract A cataract is a clouding of the lens of the eye. When a lens becomes cloudy, vision is reduced based on the degree and nature of the clouding. Many cataracts reduce vision to some degree. Some cataracts make people more near-sighted as they develop. Other cataracts increase glare. Cataracts that are ignored and become worse can sometimes look white. The white color can be seen through the pupil. CAUSES   Aging. However, cataracts may occur at any age, even in newborns.   Certain drugs.   Trauma to the eye.   Certain diseases such as diabetes.   Specific eye diseases such as chronic inflammation inside the eye or a sudden attack of a rare form of glaucoma.   Inherited or acquired medical problems.  SYMPTOMS   Gradual, progressive drop in vision in the affected eye.   Severe, rapid visual loss. This most often happens when trauma is the  cause.  DIAGNOSIS  To detect a cataract, an eye doctor examines the lens. Cataracts are best diagnosed with an exam of the eyes with the pupils enlarged (dilated) by drops.  TREATMENT  For an early cataract, vision may improve by using different eyeglasses or stronger lighting. If that does not help your vision, surgery is the only effective treatment. A cataract needs to be surgically removed when vision loss interferes with your everyday activities, such as driving, reading, or watching TV. A cataract may also have to be removed if it prevents examination or treatment of another eye problem. Surgery removes the cloudy lens and usually replaces it with a substitute lens (intraocular lens, IOL).  At a time when both you and your doctor agree, the cataract will be surgically removed. If you have cataracts in both eyes, only one is usually removed at a time. This allows the operated eye to heal and be out of danger from any possible problems after surgery (such as infection or poor wound healing). In rare cases, a cataract may be doing damage to your eye. In these cases, your caregiver may advise surgical removal right away. The vast majority of people who have cataract surgery have better vision afterward. HOME CARE INSTRUCTIONS  If you are not planning surgery, you may be asked to do the following:  Use different eyeglasses.   Use stronger or brighter lighting.   Ask your eye doctor about reducing  your medicine dose or changing medicines if it is thought that a medicine caused your cataract. Changing medicines does not make the cataract go away on its own.   Become familiar with your surroundings. Poor vision can lead to injury. Avoid bumping into things on the affected side. You are at a higher risk for tripping or falling.   Exercise extreme care when driving or operating machinery.   Wear sunglasses if you are sensitive to bright light or experiencing problems with glare.  SEEK IMMEDIATE  MEDICAL CARE IF:   You have a worsening or sudden vision loss.   You notice redness, swelling, or increasing pain in the eye.   You have a fever.  Document Released: 11/28/2005 Document Revised: 11/17/2011 Document Reviewed: 07/22/2011 [facility] Patient Information 2012 [place].PATIENT INSTRUCTIONS POST-ANESTHESIA  IMMEDIATELY FOLLOWING SURGERY:  Do not drive or operate machinery for the first twenty four hours after surgery.  Do not make any important decisions for twenty four hours after surgery or while taking narcotic pain medications or sedatives.  If you develop intractable nausea and vomiting or a severe headache please notify your doctor immediately.  FOLLOW-UP:  Please make an appointment with your surgeon as instructed. You do not need to follow up with anesthesia unless specifically instructed to do so.  WOUND CARE INSTRUCTIONS (if applicable):  Keep a dry clean dressing on the anesthesia/puncture wound site if there is drainage.  Once the wound has quit draining you may leave it open to air.  Generally you should leave the bandage intact for twenty four hours unless there is drainage.  If the epidural site drains for more than 36-48 hours please call the anesthesia department.  QUESTIONS?:  Please feel free to call your physician or the hospital operator if you have any questions, and they will be happy to assist you.

## 2019-01-11 ENCOUNTER — Encounter (HOSPITAL_COMMUNITY)
Admission: RE | Admit: 2019-01-11 | Discharge: 2019-01-11 | Disposition: A | Discharge status: Home / Self Care | Payer: Medicare Other | Source: Ambulatory Visit | Attending: Ophthalmology | Admitting: Ophthalmology

## 2019-01-11 ENCOUNTER — Encounter (HOSPITAL_COMMUNITY): Payer: Self-pay

## 2019-01-11 DIAGNOSIS — Z01818 Encounter for other preprocedural examination: Secondary | ICD-10-CM | POA: Insufficient documentation

## 2019-01-11 LAB — CBC
HCT: 37.9 % (ref 36.0–46.0)
Hemoglobin: 11.2 g/dL — ABNORMAL LOW (ref 12.0–15.0)
MCH: 26.5 pg (ref 26.0–34.0)
MCHC: 29.6 g/dL — ABNORMAL LOW (ref 30.0–36.0)
MCV: 89.6 fL (ref 80.0–100.0)
Platelets: 285 10*3/uL (ref 150–400)
RBC: 4.23 MIL/uL (ref 3.87–5.11)
RDW: 15.1 % (ref 11.5–15.5)
WBC: 6.4 10*3/uL (ref 4.0–10.5)
nRBC: 0 % (ref 0.0–0.2)

## 2019-01-11 LAB — BASIC METABOLIC PANEL
Anion gap: 11 (ref 5–15)
BUN: 41 mg/dL — ABNORMAL HIGH (ref 8–23)
CO2: 21 mmol/L — ABNORMAL LOW (ref 22–32)
Calcium: 9.2 mg/dL (ref 8.9–10.3)
Chloride: 108 mmol/L (ref 98–111)
Creatinine, Ser: 2.32 mg/dL — ABNORMAL HIGH (ref 0.44–1.00)
GFR calc Af Amer: 22 mL/min — ABNORMAL LOW (ref >60)
GFR calc non Af Amer: 19 mL/min — ABNORMAL LOW (ref >60)
Glucose, Bld: 158 mg/dL — ABNORMAL HIGH (ref 70–99)
Potassium: 3.9 mmol/L (ref 3.5–5.1)
Sodium: 140 mmol/L (ref 135–145)

## 2019-01-11 LAB — HEMOGLOBIN A1C
Hgb A1c MFr Bld: 6.9 % — ABNORMAL HIGH (ref 4.8–5.6)
Mean Plasma Glucose: 151.33 mg/dL

## 2019-01-11 LAB — GLUCOSE, CAPILLARY: Glucose-Capillary: 161 mg/dL — ABNORMAL HIGH (ref 70–99)

## 2019-01-17 NOTE — Progress Notes (Deleted)
Cardiology Office Note  Date: 01/17/2019   ID: Margarine, Grosshans Oct 24, 1936, MRN [id number]  PCP: Jani Gravel, MD  Primary Cardiologist: Rozann Lesches, MD   No chief complaint on file.   History of Present Illness: [name] is an 83 y.o. female last seen in October 2019.  Reviewed follow-up lab work from January.  Globin is stable, but she has had progressive renal insufficiency, creatinine up to 2.32.  Past Medical History:  Diagnosis Date  . Asthma   . Bronchitis   . Carotid artery disease ([place])   . Coronary atherosclerosis of native coronary artery    Nonobstructive at cath 2005  . DM2 (diabetes mellitus, type 2) ([place])   . Essential hypertension, benign   . Gout   . History of stroke   . Hypothyroidism   . Influenza 2013  . Nonischemic cardiomyopathy (HCC)    LVEF 20-25%  . PAF (paroxysmal atrial fibrillation) ([place])    a. diagnosed in 04/2018. Started on Eliquis for anticoagulation.   . Renal artery stenosis (HCC)    Bilateral    Past Surgical History:  Procedure Laterality Date  . ABDOMINAL HYSTERECTOMY      Current Outpatient Medications  Medication Sig Dispense Refill  . allopurinol (ZYLOPRIM) 100 MG tablet Take 100 mg by mouth daily.   3  . apixaban (ELIQUIS) 5 MG TABS tablet Take 1 tablet (5 mg total) by mouth 2 (two) times daily. 60 tablet 6  . atorvastatin (LIPITOR) 20 MG tablet Take 1 tablet (20 mg total) by mouth daily at 6 PM. (Patient taking differently: Take 20 mg by mouth daily at 12 noon. ) 30 tablet 6  . carvedilol (COREG) 12.5 MG tablet TAKE 1 TABLET(12.5 MG) BY MOUTH TWICE DAILY WITH A MEAL (Patient taking differently: Take 12.5 mg by mouth 2 (two) times daily. Morning & noon) 60 tablet 6  . cholecalciferol (VITAMIN D) 25 MCG (1000 UT) tablet Take 1,000 Units by mouth 2 (two) times daily.    Marland Kitchen donepezil (ARICEPT) 10 MG tablet Take 10 mg by mouth daily.    Marland Kitchen doxycycline (VIBRA-TABS) 100 MG tablet Take 100 mg by mouth 2 (two) times  daily.    . furosemide (LASIX) 20 MG tablet Take 3 tablets (60 mg total) by mouth daily. (Patient not taking: Reported on 01/07/2019) 90 tablet 1  . furosemide (LASIX) 40 MG tablet Take 80 mg by mouth daily.    Marland Kitchen gabapentin (NEURONTIN) 100 MG capsule Take 100 mg by mouth 3 (three) times daily.     . hydrALAZINE (APRESOLINE) 50 MG tablet Take 1.5 tablets (75 mg total) by mouth every 8 (eight) hours. 180 tablet 1  . isosorbide mononitrate (IMDUR) 30 MG 24 hr tablet Take 1 tablet (30 mg total) by mouth daily. 90 tablet 3  . pioglitazone (ACTOS) 15 MG tablet Take 15 mg by mouth daily with breakfast.   12  . sitaGLIPtin (JANUVIA) 50 MG tablet Take 50 mg by mouth daily.     No current facility-administered medications for this visit.    Allergies:  Yellow jacket venom [bee venom]   Social History: The patient  reports that she has never smoked. She has never used smokeless tobacco. She reports that she does not drink alcohol or use drugs.   Family History: The patient's family history includes Asthma in her son.   ROS:  Please see the history of present illness. Otherwise, complete review of systems is positive for {NONE DEFAULTED:18576::"none"}.  All  other systems are reviewed and negative.   Physical Exam: VS:  There were no vitals taken for this visit., BMI There is no height or weight on file to calculate BMI.  Wt Readings from Last 3 Encounters:  01/11/19 188 lb (85.3 kg)  09/13/18 192 lb (87.1 kg)  06/11/18 195 lb (88.5 kg)    General: Patient appears comfortable at rest. HEENT: Conjunctiva and lids normal, oropharynx clear with moist mucosa. Neck: Supple, no elevated JVP or carotid bruits, no thyromegaly. Lungs: Clear to auscultation, nonlabored breathing at rest. Cardiac: Regular rate and rhythm, no S3 or significant systolic murmur, no pericardial rub. Abdomen: Soft, nontender, no hepatomegaly, bowel sounds present, no guarding or rebound. Extremities: No pitting edema, distal  pulses 2+. Skin: Warm and dry. Musculoskeletal: No kyphosis. Neuropsychiatric: Alert and oriented x3, affect grossly appropriate.  ECG: I personally reviewed the tracing from 01/11/2019 which showed sinus rhythm with right bundle branch block.  Recent Labwork: 04/24/2018: TSH 2.515 05/14/2018: ALT 8; AST 12; B Natriuretic Peptide 328.0 01/11/2019: BUN 41; Creatinine, Ser 2.32; Hemoglobin 11.2; Platelets 285; Potassium 3.9; Sodium 140     Component Value Date/Time   CHOL 183 04/25/2018 0310   TRIG 69 04/25/2018 0310   HDL 52 04/25/2018 0310   CHOLHDL 3.5 04/25/2018 0310   VLDL 14 04/25/2018 0310   LDLCALC 117 (H) 04/25/2018 0310    Other Studies Reviewed Today:  Echocardiogram 04/25/2018: Study Conclusions  - Left ventricle: The cavity size was normal. Wall thickness was increased in a pattern of moderate LVH. Systolic function was severely reduced. The estimated ejection fraction was 30%. Diffuse hypokinesis, most prominent basal inferolateral wall. Features are consistent with a pseudonormal left ventricular filling pattern, with concomitant abnormal relaxation and increased filling pressure (grade 2 diastolic dysfunction). - Aortic valve: Mildly calcified annulus. Trileaflet. - Mitral valve: Mildly thickened leaflets. There was moderate regurgitation. - Left atrium: The atrium was mildly dilated. - Right atrium: Central venous pressure (est): 3 mm Hg. - Tricuspid valve: There was trivial regurgitation. - Pulmonary arteries: PA peak pressure: 37 mm Hg (S). - Pericardium, extracardiac: There was no pericardial effusion.  Assessment and Plan:   Current medicines were reviewed with the patient today.  No orders of the defined types were placed in this encounter.   Disposition:  Signed, Satira Sark, MD, [facility] 01/17/2019 10:38 AM    [facility]. [address], [place], [place] [address] Phone: [phone]; Fax:  [phone]

## 2019-01-18 ENCOUNTER — Ambulatory Visit (HOSPITAL_COMMUNITY): Payer: Medicare Other | Admitting: Anesthesiology

## 2019-01-18 ENCOUNTER — Encounter (HOSPITAL_COMMUNITY): Payer: Self-pay | Admitting: *Deleted

## 2019-01-18 ENCOUNTER — Encounter (HOSPITAL_COMMUNITY)
Admission: RE | Disposition: A | Discharge status: Home / Self Care | Payer: Self-pay | Source: Home / Self Care | Attending: Ophthalmology

## 2019-01-18 ENCOUNTER — Ambulatory Visit (HOSPITAL_COMMUNITY)
Admission: RE | Admit: 2019-01-18 | Discharge: 2019-01-18 | Disposition: A | Discharge status: Home / Self Care | Payer: Medicare Other | Attending: Ophthalmology | Admitting: Ophthalmology

## 2019-01-18 DIAGNOSIS — Z8673 Personal history of transient ischemic attack (TIA), and cerebral infarction without residual deficits: Secondary | ICD-10-CM | POA: Insufficient documentation

## 2019-01-18 DIAGNOSIS — H259 Unspecified age-related cataract: Secondary | ICD-10-CM | POA: Diagnosis present

## 2019-01-18 DIAGNOSIS — I509 Heart failure, unspecified: Secondary | ICD-10-CM | POA: Insufficient documentation

## 2019-01-18 DIAGNOSIS — I110 Hypertensive heart disease with heart failure: Secondary | ICD-10-CM | POA: Insufficient documentation

## 2019-01-18 HISTORY — PX: CATARACT EXTRACTION W/PHACO: SHX586

## 2019-01-18 LAB — GLUCOSE, CAPILLARY: Glucose-Capillary: 109 mg/dL — ABNORMAL HIGH (ref 70–99)

## 2019-01-18 SURGERY — PHACOEMULSIFICATION, CATARACT, WITH IOL INSERTION
Anesthesia: Monitor Anesthesia Care | Site: Eye | Laterality: Left

## 2019-01-18 MED ORDER — LACTATED RINGERS IV SOLN: INTRAVENOUS | Status: DC

## 2019-01-18 MED ORDER — LIDOCAINE HCL (PF) 1 % IJ SOLN
INTRAOCULAR | Status: DC | PRN
  Administered 2019-01-18: .8 mL via OPHTHALMIC

## 2019-01-18 MED ORDER — MIDAZOLAM HCL 2 MG/2ML IJ SOLN
INTRAMUSCULAR | Status: AC
  Filled 2019-01-18: qty 2

## 2019-01-18 MED ORDER — SODIUM HYALURONATE 23 MG/ML IO SOLN
INTRAOCULAR | Status: DC | PRN
  Administered 2019-01-18: 0.6 mL via INTRAOCULAR

## 2019-01-18 MED ORDER — EPINEPHRINE PF 1 MG/ML IJ SOLN
INTRAOCULAR | Status: DC | PRN
  Administered 2019-01-18: 500 mL

## 2019-01-18 MED ORDER — TETRACAINE HCL 0.5 % OP SOLN
1.0000 [drp] | OPHTHALMIC | Status: AC
  Administered 2019-01-18 (×3): 1 [drp] via OPHTHALMIC

## 2019-01-18 MED ORDER — MIDAZOLAM HCL 5 MG/5ML IJ SOLN
INTRAMUSCULAR | Status: DC | PRN
  Administered 2019-01-18: 1 mg via INTRAVENOUS

## 2019-01-18 MED ORDER — NEOMYCIN-POLYMYXIN-DEXAMETH 3.5-10000-0.1 OP SUSP
OPHTHALMIC | Status: DC | PRN
  Administered 2019-01-18: 2 [drp] via OPHTHALMIC

## 2019-01-18 MED ORDER — SODIUM CHLORIDE 0.9% FLUSH
10.0000 mL | INTRAVENOUS | Status: DC | PRN
  Administered 2019-01-18 (×2): 3 mL via INTRAVENOUS
  Filled 2019-01-18: qty 10

## 2019-01-18 MED ORDER — LIDOCAINE HCL 3.5 % OP GEL
1.0000 "application " | Freq: Once | OPHTHALMIC | Status: AC
  Administered 2019-01-18: 1 via OPHTHALMIC

## 2019-01-18 MED ORDER — PROVISC 10 MG/ML IO SOLN
INTRAOCULAR | Status: DC | PRN
  Administered 2019-01-18: 0.85 mL via INTRAOCULAR

## 2019-01-18 MED ORDER — POVIDONE-IODINE 5 % OP SOLN
OPHTHALMIC | Status: DC | PRN
  Administered 2019-01-18: 1 via OPHTHALMIC

## 2019-01-18 MED ORDER — CYCLOPENTOLATE-PHENYLEPHRINE 0.2-1 % OP SOLN
1.0000 [drp] | OPHTHALMIC | Status: AC
  Administered 2019-01-18 (×3): 1 [drp] via OPHTHALMIC

## 2019-01-18 MED ORDER — BSS IO SOLN
INTRAOCULAR | Status: DC | PRN
  Administered 2019-01-18: 15 mL

## 2019-01-18 MED ORDER — PHENYLEPHRINE HCL 2.5 % OP SOLN
1.0000 [drp] | OPHTHALMIC | Status: AC
  Administered 2019-01-18 (×3): 1 [drp] via OPHTHALMIC

## 2019-01-18 SURGICAL SUPPLY — 13 items
CLOTH BEACON ORANGE TIMEOUT ST (SAFETY) ×1 IMPLANT
EYE SHIELD UNIVERSAL CLEAR (GAUZE/BANDAGES/DRESSINGS) ×1 IMPLANT
GLOVE BIOGEL PI IND STRL 7.0 (GLOVE) IMPLANT
GLOVE BIOGEL PI INDICATOR 7.0 (GLOVE) ×1
LENS ALC ACRYL/TECN (Ophthalmic Related) ×1 IMPLANT
NDL HYPO 18GX1.5 BLUNT FILL (NEEDLE) IMPLANT
NEEDLE HYPO 18GX1.5 BLUNT FILL (NEEDLE) ×2 IMPLANT
PAD ARMBOARD 7.5X6 YLW CONV (MISCELLANEOUS) ×1 IMPLANT
SYR TB 1ML LL NO SAFETY (SYRINGE) ×1 IMPLANT
TAPE SURG TRANSPORE 1 IN (GAUZE/BANDAGES/DRESSINGS) IMPLANT
TAPE SURGICAL TRANSPORE 1 IN (GAUZE/BANDAGES/DRESSINGS) ×1
VISCOELASTIC ADDITIONAL (OPHTHALMIC RELATED) ×1 IMPLANT
WATER STERILE IRR 250ML POUR (IV SOLUTION) ×1 IMPLANT

## 2019-01-18 NOTE — H&P (Signed)
The H and P was reviewed and updated. The patient was examined.  No changes were found after exam.  The surgical eye was marked.  

## 2019-01-18 NOTE — Discharge Instructions (Signed)
Please discharge patient when stable, will follow up today with Dr. Styles Fambro at the  [facility] office immediately following discharge.  Leave shield in place until visit.  All paperwork with discharge instructions will be given at the office. ° ° ° ° ° °Monitored Anesthesia Care, Care After °These instructions provide you with information about caring for yourself after your procedure. Your health care provider may also give you more specific instructions. Your treatment has been planned according to current medical practices, but problems sometimes occur. Call your health care provider if you have any problems or questions after your procedure. °What can I expect after the procedure? °After your procedure, you may: °· Feel sleepy for several hours. °· Feel clumsy and have poor balance for several hours. °· Feel forgetful about what happened after the procedure. °· Have poor judgment for several hours. °· Feel nauseous or vomit. °· Have a sore throat if you had a breathing tube during the procedure. °Follow these instructions at home: °For at least 24 hours after the procedure: ° °  ° °· Have a responsible adult stay with you. It is important to have someone help care for you until you are awake and alert. °· Rest as needed. °· Do not: °? Participate in activities in which you could fall or become injured. °? Drive. °? Use heavy machinery. °? Drink alcohol. °? Take sleeping pills or medicines that cause drowsiness. °? Make important decisions or sign legal documents. °? Take care of children on your own. °Eating and drinking °· Follow the diet that is recommended by your health care provider. °· If you vomit, drink water, juice, or soup when you can drink without vomiting. °· Make sure you have little or no nausea before eating solid foods. °General instructions °· Take over-the-counter and prescription medicines only as told by your health care provider. °· If you have sleep apnea, surgery and certain  medicines can increase your risk for breathing problems. Follow instructions from your health care provider about wearing your sleep device: °? Anytime you are sleeping, including during daytime naps. °? While taking prescription pain medicines, sleeping medicines, or medicines that make you drowsy. °· If you smoke, do not smoke without supervision. °· Keep all follow-up visits as told by your health care provider. This is important. °Contact a health care provider if: °· You keep feeling nauseous or you keep vomiting. °· You feel light-headed. °· You develop a rash. °· You have a fever. °Get help right away if: °· You have trouble breathing. °Summary °· For several hours after your procedure, you may feel sleepy and have poor judgment. °· Have a responsible adult stay with you for at least 24 hours or until you are awake and alert. °This information is not intended to replace advice given to you by your health care provider. Make sure you discuss any questions you have with your health care provider. °Document Released: 03/20/2016 Document Revised: 07/14/2017 Document Reviewed: 03/20/2016 °Elsevier Interactive Patient Education © 2019 [employer] Inc[employer] ° °

## 2019-01-18 NOTE — Transfer of Care (Signed)
Immediate Anesthesia Transfer of Care Note  Patient: [name]  Procedure(s) Performed: CATARACT EXTRACTION PHACO AND INTRAOCULAR LENS PLACEMENT (IOC) (Left Eye)  Patient Location: Short Stay  Anesthesia Type:MAC  Level of Consciousness: awake, alert  and patient cooperative  Airway & Oxygen Therapy: Patient Spontanous Breathing  Post-op Assessment: Report given to RN and Post -op Vital signs reviewed and stable  Post vital signs: Reviewed and stable  Last Vitals:  Vitals Value Taken Time  BP    Temp    Pulse    Resp    SpO2      Last Pain:  Vitals:   01/18/19 0807  TempSrc: Oral  PainSc: 0-No pain         Complications: No apparent anesthesia complications

## 2019-01-18 NOTE — Anesthesia Postprocedure Evaluation (Signed)
Anesthesia Post Note  Patient: [name]  Procedure(s) Performed: CATARACT EXTRACTION PHACO AND INTRAOCULAR LENS PLACEMENT (IOC) (Left Eye)  Patient location during evaluation: Short Stay Anesthesia Type: MAC Level of consciousness: awake and alert and patient cooperative Pain management: satisfactory to patient Vital Signs Assessment: post-procedure vital signs reviewed and stable Respiratory status: spontaneous breathing Cardiovascular status: stable Postop Assessment: no apparent nausea or vomiting Anesthetic complications: no     Last Vitals:  Vitals:   01/18/19 0807 01/18/19 1001  BP: (!) 172/101 (!) 188/96  Pulse:  75  Resp: 20 20  Temp: (!) 36.4 C 36.5 C  SpO2: 93% 96%    Last Pain:  Vitals:   01/18/19 1001  TempSrc: Oral  PainSc: 0-No pain                 Renarda Mullinix

## 2019-01-18 NOTE — Anesthesia Procedure Notes (Signed)
Procedure Name: MAC Performed by: Herschell Virani S, CRNA Pre-anesthesia Checklist: Patient identified, Emergency Drugs available, Suction available, Timeout performed and Patient being monitored Patient Re-evaluated:Patient Re-evaluated prior to induction Oxygen Delivery Method: Nasal Cannula       

## 2019-01-18 NOTE — Op Note (Signed)
Date of procedure: 01/18/19  Pre-operative diagnosis: Visually significant age-related cataract, Left Eye (H25.812)  Post-operative diagnosis: Visually significant age-related cataract, Left Eye  Procedure: Removal of cataract via phacoemulsification and insertion of intra-ocular lens [employer] PCB00  +20.5D into the capsular bag of the Left Eye  Attending surgeon: Gerda Diss. Emmersyn Kratzke, MD, MA  Anesthesia: MAC, Topical [place]  Complications: None  Estimated Blood Loss: <63m (minimal)  Specimens: None  Implants: As above  Indications:  Visually significant age-related cataract, Left Eye  Procedure:  The patient was seen and identified in the pre-operative area. The operative eye was identified and dilated.  The operative eye was marked.  Topical anesthesia was administered to the operative eye.     The patient was then to the operative suite and placed in the supine position.  A timeout was performed confirming the patient, procedure to be performed, and all other relevant information.   The patient's face was prepped and draped in the usual fashion for intra-ocular surgery.  A lid speculum was placed into the operative eye and the surgical microscope moved into place and focused.  An inferotemporal paracentesis was created using a 20 gauge paracentesis blade.  Shugarcaine was injected into the anterior chamber.  Viscoelastic was injected into the anterior chamber.  A temporal clear-corneal main wound incision was created using a 2.450mmicrokeratome.  A continuous curvilinear capsulorrhexis was initiated using an irrigating cystitome and completed using capsulorrhexis forceps.  Hydrodissection and hydrodeliniation were performed.  Viscoelastic was injected into the anterior chamber.  A phacoemulsification handpiece and a chopper as a second instrument were used to remove the nucleus and epinucleus. The irrigation/aspiration handpiece was used to remove any remaining cortical  material.   The capsular bag was reinflated with viscoelastic, checked, and found to be intact.  The intraocular lens was inserted into the capsular bag and dialed into place using a Kuglen hook.  The irrigation/aspiration handpiece was used to remove any remaining viscoelastic.  The clear corneal wound and paracentesis wounds were then hydrated and checked with Weck-Cels to be watertight.  The lid-speculum and drape was removed, and the patient's face was cleaned with a wet and dry 4x4.  Maxitrol was instilled in the eye before a clear shield was taped over the eye. The patient was taken to the post-operative care unit in good condition, having tolerated the procedure well.  Post-Op Instructions: [facility] and will receive all other orders and instructions.

## 2019-01-18 NOTE — Anesthesia Preprocedure Evaluation (Signed)
Anesthesia Evaluation    Airway        Dental   Pulmonary shortness of breath, asthma , pneumonia,           Cardiovascular hypertension, + CAD and +CHF  + dysrhythmias      Neuro/Psych CVA    GI/Hepatic   Endo/Other  diabetesHypothyroidism   Renal/GU      Musculoskeletal   Abdominal   Peds  Hematology   Anesthesia Other Findings NSR 76 w/ RBBB  Reproductive/Obstetrics                             Anesthesia Physical Anesthesia Plan  ASA: IV  Anesthesia Plan: MAC   Post-op Pain Management:    Induction:   PONV Risk Score and Plan:   Airway Management Planned:   Additional Equipment:   Intra-op Plan:   Post-operative Plan:   Informed Consent: I have reviewed the patients History and Physical, chart, labs and discussed the procedure including the risks, benefits and alternatives for the proposed anesthesia with the patient or authorized representative who has indicated his/her understanding and acceptance.       Plan Discussed with: Anesthesiologist  Anesthesia Plan Comments:         Anesthesia Quick Evaluation

## 2019-01-21 ENCOUNTER — Encounter (HOSPITAL_COMMUNITY): Payer: Self-pay | Admitting: Ophthalmology

## 2019-01-21 ENCOUNTER — Ambulatory Visit: Payer: Medicare Other | Admitting: Cardiology

## 2019-01-22 ENCOUNTER — Emergency Department (HOSPITAL_COMMUNITY): Payer: Medicare Other

## 2019-01-22 ENCOUNTER — Encounter (HOSPITAL_COMMUNITY): Payer: Self-pay | Admitting: Emergency Medicine

## 2019-01-22 ENCOUNTER — Other Ambulatory Visit: Payer: Self-pay

## 2019-01-22 ENCOUNTER — Emergency Department (HOSPITAL_COMMUNITY)
Admission: EM | Admit: 2019-01-22 | Discharge: 2019-01-22 | Disposition: A | Discharge status: Home / Self Care | Payer: Medicare Other | Attending: Emergency Medicine | Admitting: Emergency Medicine

## 2019-01-22 DIAGNOSIS — R05 Cough: Secondary | ICD-10-CM | POA: Diagnosis present

## 2019-01-22 DIAGNOSIS — J101 Influenza due to other identified influenza virus with other respiratory manifestations: Secondary | ICD-10-CM | POA: Insufficient documentation

## 2019-01-22 DIAGNOSIS — E119 Type 2 diabetes mellitus without complications: Secondary | ICD-10-CM | POA: Insufficient documentation

## 2019-01-22 DIAGNOSIS — J45909 Unspecified asthma, uncomplicated: Secondary | ICD-10-CM | POA: Diagnosis not present

## 2019-01-22 DIAGNOSIS — I10 Essential (primary) hypertension: Secondary | ICD-10-CM | POA: Insufficient documentation

## 2019-01-22 DIAGNOSIS — E039 Hypothyroidism, unspecified: Secondary | ICD-10-CM | POA: Insufficient documentation

## 2019-01-22 LAB — CBC WITH DIFFERENTIAL/PLATELET
Abs Immature Granulocytes: 0.02 10*3/uL (ref 0.00–0.07)
Basophils Absolute: 0 10*3/uL (ref 0.0–0.1)
Basophils Relative: 1 %
Eosinophils Absolute: 0 10*3/uL (ref 0.0–0.5)
Eosinophils Relative: 1 %
HCT: 38.5 % (ref 36.0–46.0)
Hemoglobin: 11.6 g/dL — ABNORMAL LOW (ref 12.0–15.0)
Immature Granulocytes: 1 %
Lymphocytes Relative: 15 %
Lymphs Abs: 0.6 10*3/uL — ABNORMAL LOW (ref 0.7–4.0)
MCH: 26.7 pg (ref 26.0–34.0)
MCHC: 30.1 g/dL (ref 30.0–36.0)
MCV: 88.5 fL (ref 80.0–100.0)
Monocytes Absolute: 0.8 10*3/uL (ref 0.1–1.0)
Monocytes Relative: 21 %
Neutro Abs: 2.5 10*3/uL (ref 1.7–7.7)
Neutrophils Relative %: 61 %
Platelets: 200 10*3/uL (ref 150–400)
RBC: 4.35 MIL/uL (ref 3.87–5.11)
RDW: 14.6 % (ref 11.5–15.5)
WBC: 4 10*3/uL (ref 4.0–10.5)
nRBC: 0 % (ref 0.0–0.2)

## 2019-01-22 LAB — BASIC METABOLIC PANEL
Anion gap: 9 (ref 5–15)
BUN: 18 mg/dL (ref 8–23)
CO2: 22 mmol/L (ref 22–32)
Calcium: 9.4 mg/dL (ref 8.9–10.3)
Chloride: 107 mmol/L (ref 98–111)
Creatinine, Ser: 1.59 mg/dL — ABNORMAL HIGH (ref 0.44–1.00)
GFR calc Af Amer: 35 mL/min — ABNORMAL LOW (ref >60)
GFR calc non Af Amer: 30 mL/min — ABNORMAL LOW (ref >60)
Glucose, Bld: 112 mg/dL — ABNORMAL HIGH (ref 70–99)
Potassium: 3.9 mmol/L (ref 3.5–5.1)
Sodium: 138 mmol/L (ref 135–145)

## 2019-01-22 LAB — INFLUENZA PANEL BY PCR (TYPE A & B)
Influenza A By PCR: NEGATIVE
Influenza B By PCR: POSITIVE — AB

## 2019-01-22 MED ORDER — OSELTAMIVIR PHOSPHATE 75 MG PO CAPS: 75.0000 mg | ORAL_CAPSULE | Freq: Two times a day (BID) | ORAL | 0 refills | Status: DC

## 2019-01-22 MED ORDER — OSELTAMIVIR PHOSPHATE 75 MG PO CAPS
75.0000 mg | ORAL_CAPSULE | Freq: Once | ORAL | Status: AC
  Administered 2019-01-22: 75 mg via ORAL
  Filled 2019-01-22: qty 1

## 2019-01-22 NOTE — ED Notes (Signed)
Ambulated pt oxygen level started at 95% walked to the bathroom and back 02 ended at 92%

## 2019-01-22 NOTE — ED Provider Notes (Signed)
[facility] Emergency Department Provider Note MRN:  [id number]  Arrival date & time: 01/22/19     Chief Complaint   Cough   History of Present Illness   [name] is a 83 y.o. year-old female with a history of CAD, diabetes, CHF, A. fib presenting to the ED with chief complaint of cough.  1 day of persistent dry cough, runny nose, general malaise, dull frontal headache.  Subjective fever "once in a while".  Denies chest pain or shortness of breath, no abdominal pain.  No rash, no vomiting, no diarrhea, unsure if she has been around anybody with the flu.  Symptoms are constant, no exacerbating or relieving factors.  Review of Systems  A complete 10 system review of systems was obtained and all systems are negative except as noted in the HPI and PMH.   Patient's Health History    Past Medical History:  Diagnosis Date  . Asthma   . Bronchitis   . Carotid artery disease ([place])   . Coronary atherosclerosis of native coronary artery    Nonobstructive at cath 2005  . DM2 (diabetes mellitus, type 2) ([place])   . Essential hypertension, benign   . Gout   . History of stroke   . Hypothyroidism   . Influenza 2013  . Nonischemic cardiomyopathy (HCC)    LVEF 20-25%  . PAF (paroxysmal atrial fibrillation) ([place])    a. diagnosed in 04/2018. Started on Eliquis for anticoagulation.   . Renal artery stenosis (HCC)    Bilateral    Past Surgical History:  Procedure Laterality Date  . ABDOMINAL HYSTERECTOMY    . CATARACT EXTRACTION W/PHACO Left 01/18/2019   Procedure: CATARACT EXTRACTION PHACO AND INTRAOCULAR LENS PLACEMENT ([place]);  Surgeon: Baruch Goldmann, MD;  Location: [facility];  Service: Ophthalmology;  Laterality: Left;  CDE: 11.99    Family History  Problem Relation Age of Onset  . Asthma Son     Social History   Socioeconomic History  . Marital status: Single    Spouse name: Not on file  . Number of children: 14  . Years of education: Not on file  . Highest  education level: Not on file  Occupational History  . Occupation: Retired  Scientific laboratory technician  . Financial resource strain: Not on file  . Food insecurity:    Worry: Not on file    Inability: Not on file  . Transportation needs:    Medical: Not on file    Non-medical: Not on file  Tobacco Use  . Smoking status: Never Smoker  . Smokeless tobacco: Never Used  Substance and Sexual Activity  . Alcohol use: No    Alcohol/week: 0.0 standard drinks  . Drug use: No  . Sexual activity: Never    Birth control/protection: Surgical  Lifestyle  . Physical activity:    Days per week: Not on file    Minutes per session: Not on file  . Stress: Not on file  Relationships  . Social connections:    Talks on phone: Not on file    Gets together: Not on file    Attends religious service: Not on file    Active member of club or organization: Not on file    Attends meetings of clubs or organizations: Not on file    Relationship status: Not on file  . Intimate partner violence:    Fear of current or ex partner: Not on file    Emotionally abused: Not on file  Physically abused: Not on file    Forced sexual activity: Not on file  Other Topics Concern  . Not on file  Social History Narrative  . Not on file     Physical Exam  Vital Signs and Nursing Notes reviewed Vitals:   01/22/19 2145 01/22/19 2200  BP:  140/88  Pulse: 75 86  Resp: 19 20  Temp:    SpO2: 100% 99%    CONSTITUTIONAL: Well-appearing, NAD NEURO:  Alert and oriented x 3, no focal deficits EYES:  eyes equal and reactive ENT/NECK:  no LAD, no JVD CARDIO: Regular rate, well-perfused, normal S1 and S2 PULM:  CTAB no wheezing or rhonchi GI/GU:  normal bowel sounds, non-distended, non-tender MSK/SPINE:  No gross deformities, no edema SKIN:  no rash, atraumatic PSYCH:  Appropriate speech and behavior  Diagnostic and Interventional Summary    EKG Interpretation  Date/Time:  Tuesday January 22 2019 20:28:37 EST Ventricular  Rate:  92 PR Interval:    QRS Duration: 144 QT Interval:  422 QTC Calculation: 520 R Axis:   77 Text Interpretation:  Sinus rhythm Atrial premature complex Right bundle branch block Baseline wander in lead(s) I III aVL Confirmed by Gerlene Fee [id number]) on 01/22/2019 10:56:52 PM      Labs Reviewed  CBC WITH DIFFERENTIAL/PLATELET - Abnormal; Notable for the following components:      Result Value   Hemoglobin 11.6 (*)    Lymphs Abs 0.6 (*)    All other components within normal limits  BASIC METABOLIC PANEL - Abnormal; Notable for the following components:   Glucose, Bld 112 (*)    Creatinine, Ser 1.59 (*)    GFR calc non Af Amer 30 (*)    GFR calc Af Amer 35 (*)    All other components within normal limits  INFLUENZA PANEL BY PCR (TYPE A & B) - Abnormal; Notable for the following components:   Influenza B By PCR POSITIVE (*)    All other components within normal limits    DG Chest 2 View  Final Result      Medications  oseltamivir (TAMIFLU) capsule 75 mg (75 mg Oral Given 01/22/19 2227)     Procedures Critical Care  ED Course and Medical Decision Making  I have reviewed the triage vital signs and the nursing notes.  Pertinent labs & imaging results that were available during my care of the patient were reviewed by me and considered in my medical decision making (see below for details).  Suspect influenza or other viral illness, chest x-ray without evidence of pneumonia.  Labs are reassuring, vital signs are within normal limits.  Will ambulate the patient with pulse ox, influenza PCR pending.  Would be a candidate for discharge if ambulates well.  Patient ambulated well without desaturation.  This was unfortunately done without her home oxygen.  Despite this, her oxygenation held.  Positive for influenza B, no indication for admission, prescription for Tamiflu.  After the discussed management above, the patient was determined to be safe for discharge.  The patient was in  agreement with this plan and all questions regarding their care were answered.  ED return precautions were discussed and the patient will return to the ED with any significant worsening of condition.  Barth Kirks. Sedonia Small, [place] [email or web address]  Final Clinical Impressions(s) / ED Diagnoses     ICD-10-CM   1. Influenza B J10.1     ED Discharge Orders  Ordered    oseltamivir (TAMIFLU) 75 MG capsule  Every 12 hours     01/22/19 2255             Maudie Flakes, MD 01/22/19 2258

## 2019-01-22 NOTE — Discharge Instructions (Addendum)
You were evaluated in the Emergency Department and after careful evaluation, we did not find any emergent condition requiring admission or further testing in the hospital.  Your symptoms today seem to be due to the flu.  Please take the flu medicine as directed.  Please return to the Emergency Department if you experience any worsening of your condition.  We encourage you to follow up with a primary care provider.  Thank you for allowing [place] to be a part of your care.

## 2019-01-22 NOTE — ED Notes (Signed)
Pt ambulated without oxygen Patient is on 2L [place] at home chronically

## 2019-01-22 NOTE — ED Triage Notes (Signed)
Patient complaining of cough and headache x 2 days.

## 2019-01-25 ENCOUNTER — Encounter (HOSPITAL_COMMUNITY)
Admission: RE | Admit: 2019-01-25 | Discharge: 2019-01-25 | Disposition: A | Discharge status: Home / Self Care | Payer: Medicare Other | Source: Ambulatory Visit | Attending: Ophthalmology | Admitting: Ophthalmology

## 2019-01-25 ENCOUNTER — Encounter (HOSPITAL_COMMUNITY): Payer: Self-pay

## 2019-02-06 NOTE — Progress Notes (Signed)
Cardiology Office Note  Date: 02/07/2019   ID: Marzelle, Rutten 12-16-1935, MRN [id number]  PCP: Jani Gravel, MD  Primary Cardiologist: Rozann Lesches, MD   Chief Complaint  Patient presents with  . Cardiomyopathy    History of Present Illness: KRISTENE LIBERATI is an 83 y.o. female last seen in October 2019.  She is here today with 1 of her daughters for a routine visit.  States that she has had a recent episode of influenza, but feels better.  Also with apparent viral URI.  From a cardiac perspective, her weight is actually down, no orthopnea or chest pain.  Reviewed her medications which are stable from a cardiac perspective and outlined below.  Compared to October 2019 her weight is down by about 5 pounds.  Echocardiogram from May of last year revealed LVEF 30% with diffuse hypokinesis and moderate diastolic dysfunction.  She was in sinus rhythm by her most recent ECG.  She does continue on Eliquis for stroke prophylaxis with paroxysmal atrial fibrillation.  Recent lab work is reviewed below.  She does not report any bleeding episodes.  Past Medical History:  Diagnosis Date  . Asthma   . Bronchitis   . Carotid artery disease ([place])   . Coronary atherosclerosis of native coronary artery    Nonobstructive at cath 2005  . DM2 (diabetes mellitus, type 2) ([place])   . Essential hypertension   . Gout   . History of stroke   . Hypothyroidism   . Influenza 2013  . Nonischemic cardiomyopathy (HCC)    LVEF 20-25%  . PAF (paroxysmal atrial fibrillation) ([place])    a. diagnosed in 04/2018. Started on Eliquis for anticoagulation.   . Renal artery stenosis (HCC)    Bilateral    Past Surgical History:  Procedure Laterality Date  . ABDOMINAL HYSTERECTOMY    . CATARACT EXTRACTION W/PHACO Left 01/18/2019   Procedure: CATARACT EXTRACTION PHACO AND INTRAOCULAR LENS PLACEMENT ([place]);  Surgeon: Baruch Goldmann, MD;  Location: [facility];  Service: Ophthalmology;  Laterality: Left;  CDE: 11.99      Current Outpatient Medications  Medication Sig Dispense Refill  . allopurinol (ZYLOPRIM) 100 MG tablet Take 100 mg by mouth daily.   3  . atorvastatin (LIPITOR) 20 MG tablet Take 1 tablet (20 mg total) by mouth daily at 6 PM. (Patient taking differently: Take 20 mg by mouth daily at 12 noon. ) 30 tablet 6  . benzonatate (TESSALON) 100 MG capsule Take 100 mg by mouth every 8 (eight) hours as needed for cough.     . carvedilol (COREG) 12.5 MG tablet TAKE 1 TABLET(12.5 MG) BY MOUTH TWICE DAILY WITH A MEAL (Patient taking differently: Take 12.5 mg by mouth 2 (two) times daily. Morning & noon) 60 tablet 6  . cholecalciferol (VITAMIN D) 25 MCG (1000 UT) tablet Take 1,000 Units by mouth 2 (two) times daily.    Marland Kitchen donepezil (ARICEPT) 10 MG tablet Take 10 mg by mouth daily.    Marland Kitchen doxycycline (VIBRA-TABS) 100 MG tablet Take 100 mg by mouth 2 (two) times daily.    . fluticasone (FLONASE) 50 MCG/ACT nasal spray Place 1 spray into both nostrils daily as needed for allergies or rhinitis.     . furosemide (LASIX) 40 MG tablet Take 80 mg by mouth daily.    Marland Kitchen gabapentin (NEURONTIN) 100 MG capsule Take 100 mg by mouth 3 (three) times daily.     . hydrALAZINE (APRESOLINE) 50 MG tablet Take 1.5 tablets (75 mg  total) by mouth every 8 (eight) hours. 180 tablet 1  . ILEVRO 0.3 % ophthalmic suspension Place 1 drop into the left eye See admin instructions. Apply one drop into operative eye once daily starting 2 days before surgery and continue as directed by prescriber    . isosorbide mononitrate (IMDUR) 30 MG 24 hr tablet Take 1 tablet (30 mg total) by mouth daily. 90 tablet 3  . moxifloxacin (VIGAMOX) 0.5 % ophthalmic solution Place 1 drop into the left eye See admin instructions. Instill one drop into operative eye three times daily starting 2 days before surgery and continue as directed.    Marland Kitchen oseltamivir (TAMIFLU) 75 MG capsule Take 1 capsule (75 mg total) by mouth every 12 (twelve) hours. 10 capsule 0  .  pioglitazone (ACTOS) 15 MG tablet Take 15 mg by mouth daily with breakfast.   12  . sitaGLIPtin (JANUVIA) 50 MG tablet Take 50 mg by mouth daily.    Marland Kitchen apixaban (ELIQUIS) 2.5 MG TABS tablet Take 1 tablet (2.5 mg total) by mouth 2 (two) times daily. 60 tablet 11   No current facility-administered medications for this visit.    Allergies:  Yellow jacket venom [bee venom]   Social History: The patient  reports that she has never smoked. She has never used smokeless tobacco. She reports that she does not drink alcohol or use drugs.  ROS:  Please see the history of present illness. Otherwise, complete review of systems is positive for memory deficits, hearing loss.  All other systems are reviewed and negative.   Physical Exam: VS:  BP 116/66   Pulse 79   Ht 5\' 3"  (1.6 m)   Wt 187 lb (84.8 kg)   SpO2 94%   BMI 33.13 kg/m , BMI Body mass index is 33.13 kg/m.  Wt Readings from Last 3 Encounters:  02/07/19 187 lb (84.8 kg)  01/11/19 188 lb (85.3 kg)  09/13/18 192 lb (87.1 kg)    General: Elderly woman, appears comfortable at rest. HEENT: Conjunctiva and lids normal, oropharynx clear. Neck: Supple, no elevated JVP or carotid bruits, no thyromegaly. Lungs: Clear to auscultation, nonlabored breathing at rest. Cardiac: Regular rate and rhythm, no S3 or significant systolic murmur. Abdomen: Soft, nontender, bowel sounds present. Extremities: Trace ankle edema, distal pulses 2+. Skin: Warm and dry. Musculoskeletal: No kyphosis. Neuropsychiatric: Alert and oriented x3, affect grossly appropriate.  ECG: I personally reviewed the tracing from 01/22/2019 which showed sinus rhythm with right bundle branch block and PAC.  Recent Labwork: 04/24/2018: TSH 2.515 05/14/2018: ALT 8; AST 12; B Natriuretic Peptide 328.0 01/22/2019: BUN 18; Creatinine, Ser 1.59; Hemoglobin 11.6; Platelets 200; Potassium 3.9; Sodium 138     Component Value Date/Time   CHOL 183 04/25/2018 0310   TRIG 69 04/25/2018 0310    HDL 52 04/25/2018 0310   CHOLHDL 3.5 04/25/2018 0310   VLDL 14 04/25/2018 0310   LDLCALC 117 (H) 04/25/2018 0310    Other Studies Reviewed Today:  Echocardiogram 04/25/2018: Study Conclusions  - Left ventricle: The cavity size was normal. Wall thickness was increased in a pattern of moderate LVH. Systolic function was severely reduced. The estimated ejection fraction was 30%. Diffuse hypokinesis, most prominent basal inferolateral wall. Features are consistent with a pseudonormal left ventricular filling pattern, with concomitant abnormal relaxation and increased filling pressure (grade 2 diastolic dysfunction). - Aortic valve: Mildly calcified annulus. Trileaflet. - Mitral valve: Mildly thickened leaflets. There was moderate regurgitation. - Left atrium: The atrium was mildly dilated. - Right  atrium: Central venous pressure (est): 3 mm Hg. - Tricuspid valve: There was trivial regurgitation. - Pulmonary arteries: PA peak pressure: 37 mm Hg (S). - Pericardium, extracardiac: There was no pericardial effusion.  Carotid Dopplers 05/15/2018: IMPRESSION: Less than 50% stenosis in the right and left internal carotid arteries.   Assessment and Plan:  1.  Nonischemic cardiomyopathy with LVEF approximately 30%.  Plan is to continue medical therapy, not a good candidate for ICD.  She has been clinically stable from a fluid perspective, weight is actually down about 5 pounds since last visit.  Recent lab work reviewed.  Continue Coreg, hydralazine, Imdur, and Lasix.  She is not on ARB or ACE inhibitor with renal insufficiency.  2.  Paroxysmal atrial fibrillation.  She is in sinus rhythm.  Continue Eliquis although reduce dose to 2.5 mg daily (creatinine and age).  3.  Essential hypertension, blood pressure well controlled today.  4.  Recent influenza and URI, followed by Dr. Maudie Mercury.  Current medicines were reviewed with the patient today.  Disposition: Follow-up in 6  months.  Signed, Satira Sark, MD, [facility] 02/07/2019 2:30 PM    [facility] [place]. [address] [place], [place] [address] Phone: [phone]; Fax: [phone]

## 2019-02-07 ENCOUNTER — Ambulatory Visit (INDEPENDENT_AMBULATORY_CARE_PROVIDER_SITE_OTHER): Payer: Medicare Other | Admitting: Cardiology

## 2019-02-07 ENCOUNTER — Encounter: Payer: Self-pay | Admitting: Cardiology

## 2019-02-07 ENCOUNTER — Telehealth: Payer: Self-pay

## 2019-02-07 VITALS — BP 116/66 | HR 79 | Ht 63.0 in | Wt 187.0 lb

## 2019-02-07 DIAGNOSIS — I428 Other cardiomyopathies: Secondary | ICD-10-CM | POA: Diagnosis not present

## 2019-02-07 DIAGNOSIS — I480 Paroxysmal atrial fibrillation: Secondary | ICD-10-CM

## 2019-02-07 DIAGNOSIS — I10 Essential (primary) hypertension: Secondary | ICD-10-CM

## 2019-02-07 MED ORDER — APIXABAN 2.5 MG PO TABS: 2.5000 mg | ORAL_TABLET | Freq: Two times a day (BID) | ORAL | 11 refills | Status: DC

## 2019-02-07 NOTE — Telephone Encounter (Signed)
Patient had left the office.In review, Dr.McDowell has decreased Eliquis from 5 mg BID to 2.5 mg BID.I spoke with DPR, daughter, [name], and explained that we are decreasing her dose.I have samples here at the office she will pick up today for her. ( see sample medication sign out book)    Daughter came and we discussed medication change and samples were given

## 2019-02-07 NOTE — Patient Instructions (Addendum)
Medication Instructions:  DECREASE Eliquis to 2.5 mg twice a day. I spoke with pt's DPR,   Labwork: None today  Procedures/Testing: None today  Follow-Up: 6 months with Dr.McDowell  Any Additional Special Instructions Will Be Listed Below (If Applicable).     If you need a refill on your cardiac medications before your next appointment, please call your pharmacy.

## 2019-02-19 ENCOUNTER — Encounter (HOSPITAL_COMMUNITY): Payer: Self-pay

## 2019-02-20 ENCOUNTER — Other Ambulatory Visit: Payer: Self-pay

## 2019-02-20 ENCOUNTER — Encounter (HOSPITAL_COMMUNITY)
Admission: RE | Admit: 2019-02-20 | Discharge: 2019-02-20 | Disposition: A | Discharge status: Home / Self Care | Payer: Medicare Other | Source: Ambulatory Visit | Attending: Ophthalmology | Admitting: Ophthalmology

## 2019-02-26 ENCOUNTER — Ambulatory Visit (HOSPITAL_COMMUNITY)
Admission: RE | Admit: 2019-02-26 | Discharge: 2019-02-26 | Disposition: A | Discharge status: Home / Self Care | Payer: Medicare Other | Attending: Ophthalmology | Admitting: Ophthalmology

## 2019-02-26 ENCOUNTER — Encounter (HOSPITAL_COMMUNITY): Payer: Self-pay

## 2019-02-26 ENCOUNTER — Other Ambulatory Visit: Payer: Self-pay

## 2019-02-26 ENCOUNTER — Ambulatory Visit (HOSPITAL_COMMUNITY): Payer: Medicare Other | Admitting: Anesthesiology

## 2019-02-26 ENCOUNTER — Encounter (HOSPITAL_COMMUNITY)
Admission: RE | Disposition: A | Discharge status: Home / Self Care | Payer: Self-pay | Source: Home / Self Care | Attending: Ophthalmology

## 2019-02-26 DIAGNOSIS — Z7984 Long term (current) use of oral hypoglycemic drugs: Secondary | ICD-10-CM | POA: Insufficient documentation

## 2019-02-26 DIAGNOSIS — Z7901 Long term (current) use of anticoagulants: Secondary | ICD-10-CM | POA: Insufficient documentation

## 2019-02-26 DIAGNOSIS — I110 Hypertensive heart disease with heart failure: Secondary | ICD-10-CM | POA: Diagnosis not present

## 2019-02-26 DIAGNOSIS — E1136 Type 2 diabetes mellitus with diabetic cataract: Secondary | ICD-10-CM | POA: Insufficient documentation

## 2019-02-26 DIAGNOSIS — H25811 Combined forms of age-related cataract, right eye: Secondary | ICD-10-CM | POA: Diagnosis not present

## 2019-02-26 DIAGNOSIS — I509 Heart failure, unspecified: Secondary | ICD-10-CM | POA: Insufficient documentation

## 2019-02-26 DIAGNOSIS — Z79899 Other long term (current) drug therapy: Secondary | ICD-10-CM | POA: Diagnosis not present

## 2019-02-26 DIAGNOSIS — I2510 Atherosclerotic heart disease of native coronary artery without angina pectoris: Secondary | ICD-10-CM | POA: Diagnosis not present

## 2019-02-26 HISTORY — PX: CATARACT EXTRACTION W/PHACO: SHX586

## 2019-02-26 LAB — GLUCOSE, CAPILLARY: Glucose-Capillary: 101 mg/dL — ABNORMAL HIGH (ref 70–99)

## 2019-02-26 SURGERY — PHACOEMULSIFICATION, CATARACT, WITH IOL INSERTION
Anesthesia: Monitor Anesthesia Care | Site: Eye | Laterality: Right

## 2019-02-26 MED ORDER — POVIDONE-IODINE 5 % OP SOLN
OPHTHALMIC | Status: DC | PRN
  Administered 2019-02-26: 1 via OPHTHALMIC

## 2019-02-26 MED ORDER — TETRACAINE HCL 0.5 % OP SOLN
1.0000 [drp] | OPHTHALMIC | Status: AC
  Administered 2019-02-26 (×3): 1 [drp] via OPHTHALMIC

## 2019-02-26 MED ORDER — TETRACAINE HCL 0.5 % OP SOLN
OPHTHALMIC | Status: DC | PRN
  Administered 2019-02-26: 1 [drp] via OPHTHALMIC

## 2019-02-26 MED ORDER — CYCLOPENTOLATE-PHENYLEPHRINE 0.2-1 % OP SOLN
1.0000 [drp] | OPHTHALMIC | Status: AC
  Administered 2019-02-26 (×3): 1 [drp] via OPHTHALMIC

## 2019-02-26 MED ORDER — EPINEPHRINE PF 1 MG/ML IJ SOLN
INTRAMUSCULAR | Status: AC
  Filled 2019-02-26: qty 2

## 2019-02-26 MED ORDER — SODIUM HYALURONATE 23 MG/ML IO SOLN
INTRAOCULAR | Status: DC | PRN
  Administered 2019-02-26: 0.6 mL via INTRAOCULAR

## 2019-02-26 MED ORDER — MIDAZOLAM HCL 2 MG/2ML IJ SOLN
INTRAMUSCULAR | Status: AC
  Filled 2019-02-26: qty 2

## 2019-02-26 MED ORDER — NEOMYCIN-POLYMYXIN-DEXAMETH 3.5-10000-0.1 OP SUSP
OPHTHALMIC | Status: DC | PRN
  Administered 2019-02-26: 1 [drp] via OPHTHALMIC

## 2019-02-26 MED ORDER — PROVISC 10 MG/ML IO SOLN
INTRAOCULAR | Status: DC | PRN
  Administered 2019-02-26: 0.85 mL via INTRAOCULAR

## 2019-02-26 MED ORDER — MIDAZOLAM HCL 5 MG/5ML IJ SOLN
INTRAMUSCULAR | Status: DC | PRN
  Administered 2019-02-26 (×2): 1 mg via INTRAVENOUS

## 2019-02-26 MED ORDER — LIDOCAINE HCL (PF) 1 % IJ SOLN
INTRAOCULAR | Status: DC | PRN
  Administered 2019-02-26: 1 mL via OPHTHALMIC

## 2019-02-26 MED ORDER — PHENYLEPHRINE HCL 2.5 % OP SOLN
1.0000 [drp] | OPHTHALMIC | Status: AC
  Administered 2019-02-26 (×3): 1 [drp] via OPHTHALMIC

## 2019-02-26 MED ORDER — LIDOCAINE HCL 3.5 % OP GEL
1.0000 "application " | Freq: Once | OPHTHALMIC | Status: AC
  Administered 2019-02-26: 1 via OPHTHALMIC

## 2019-02-26 MED ORDER — BSS IO SOLN
INTRAOCULAR | Status: DC | PRN
  Administered 2019-02-26: 15 mL via INTRAOCULAR

## 2019-02-26 MED ORDER — EPINEPHRINE PF 1 MG/ML IJ SOLN
INTRAOCULAR | Status: DC | PRN
  Administered 2019-02-26: 500 mL

## 2019-02-26 MED ORDER — TETRACAINE HCL 0.5 % OP SOLN
OPHTHALMIC | Status: AC
  Filled 2019-02-26: qty 4

## 2019-02-26 SURGICAL SUPPLY — 14 items
CLOTH BEACON ORANGE TIMEOUT ST (SAFETY) ×1 IMPLANT
EYE SHIELD UNIVERSAL CLEAR (GAUZE/BANDAGES/DRESSINGS) ×1 IMPLANT
GLOVE BIOGEL PI IND STRL 6.5 (GLOVE) IMPLANT
GLOVE BIOGEL PI INDICATOR 6.5 (GLOVE) ×3
GOWN STRL REUS W/TWL LRG LVL3 (GOWN DISPOSABLE) ×1 IMPLANT
LENS ALC ACRYL/TECN (Ophthalmic Related) ×1 IMPLANT
NDL HYPO 18GX1.5 BLUNT FILL (NEEDLE) IMPLANT
NEEDLE HYPO 18GX1.5 BLUNT FILL (NEEDLE) ×2 IMPLANT
PAD ARMBOARD 7.5X6 YLW CONV (MISCELLANEOUS) ×1 IMPLANT
SYR TB 1ML LL NO SAFETY (SYRINGE) ×1 IMPLANT
TAPE SURG TRANSPORE 1 IN (GAUZE/BANDAGES/DRESSINGS) IMPLANT
TAPE SURGICAL TRANSPORE 1 IN (GAUZE/BANDAGES/DRESSINGS) ×1
VISCOELASTIC ADDITIONAL (OPHTHALMIC RELATED) ×1 IMPLANT
WATER STERILE IRR 250ML POUR (IV SOLUTION) ×1 IMPLANT

## 2019-02-26 NOTE — Discharge Instructions (Addendum)
PATIENT INSTRUCTIONS °POST-ANESTHESIA ° °IMMEDIATELY FOLLOWING SURGERY:  Do not drive or operate machinery for the first twenty four hours after surgery.  Do not make any important decisions for twenty four hours after surgery or while taking narcotic pain medications or sedatives.  If you develop intractable nausea and vomiting or a severe headache please notify your doctor immediately. ° °FOLLOW-UP:  Please make an appointment with your surgeon as instructed. You do not need to follow up with anesthesia unless specifically instructed to do so. ° °WOUND CARE INSTRUCTIONS (if applicable):  Keep a dry clean dressing on the anesthesia/puncture wound site if there is drainage.  Once the wound has quit draining you may leave it open to air.  Generally you should leave the bandage intact for twenty four hours unless there is drainage.  If the epidural site drains for more than 36-48 hours please call the anesthesia department. ° °QUESTIONS?:  Please feel free to call your physician or the hospital operator if you have any questions, and they will be happy to assist you.    ° ° °Please discharge patient when stable, will follow up today with Dr. Wrzosek at the [facility] office immediately following discharge.  Leave shield in place until visit.  All paperwork with discharge instructions will be given at the office. ° °

## 2019-02-26 NOTE — Transfer of Care (Signed)
Immediate Anesthesia Transfer of Care Note  Patient: [name]  Procedure(s) Performed: CATARACT EXTRACTION PHACO AND INTRAOCULAR LENS PLACEMENT RIGHT EYE (CDE: 13.46) (Right Eye)  Patient Location: Short Stay  Anesthesia Type:MAC  Level of Consciousness: awake and patient cooperative  Airway & Oxygen Therapy: Patient Spontanous Breathing  Post-op Assessment: Report given to RN and Post -op Vital signs reviewed and stable  Post vital signs: Reviewed and stable  Last Vitals:  Vitals Value Taken Time  BP    Temp    Pulse    Resp    SpO2      Last Pain:  Vitals:   02/26/19 0818  TempSrc:   PainSc: 0-No pain      Patients Stated Pain Goal: 8 (59/16/38 [id number])  Complications: No apparent anesthesia complications

## 2019-02-26 NOTE — Anesthesia Preprocedure Evaluation (Addendum)
Anesthesia Evaluation  Patient identified by MRN, date of birth, ID band Patient awake    Reviewed: Allergy & Precautions, NPO status , Patient's Chart, lab work & pertinent test results  Airway Mallampati: II  TM Distance: >3 FB Neck ROM: Full    Dental no notable dental hx. (+) Edentulous Upper, Edentulous Lower   Pulmonary shortness of breath and with exertion, asthma , pneumonia, resolved,    Pulmonary exam normal breath sounds clear to auscultation       Cardiovascular Exercise Tolerance: Poor hypertension, + CAD and +CHF  Normal cardiovascular exam+ dysrhythmias II Rhythm:Regular Rate:Normal     Neuro/Psych Dementia CVA negative psych ROS   GI/Hepatic negative GI ROS, Neg liver ROS,   Endo/Other  diabetesHypothyroidism   Renal/GU Renal InsufficiencyRenal disease  negative genitourinary   Musculoskeletal negative musculoskeletal ROS (+)   Abdominal   Peds negative pediatric ROS (+)  Hematology negative hematology ROS (+)   Anesthesia Other Findings   Reproductive/Obstetrics negative OB ROS                            Anesthesia Physical Anesthesia Plan  ASA: II  Anesthesia Plan: MAC   Post-op Pain Management:    Induction: Intravenous  PONV Risk Score and Plan:   Airway Management Planned: Nasal Cannula and Simple Face Mask  Additional Equipment:   Intra-op Plan:   Post-operative Plan:   Informed Consent: I have reviewed the patients History and Physical, chart, labs and discussed the procedure including the risks, benefits and alternatives for the proposed anesthesia with the patient or authorized representative who has indicated his/her understanding and acceptance.     Dental advisory given  Plan Discussed with: CRNA  Anesthesia Plan Comments:         Anesthesia Quick Evaluation

## 2019-02-26 NOTE — H&P (Signed)
The H and P was reviewed and updated. The patient was examined.  No changes were found after exam.  The surgical eye was marked.  

## 2019-02-26 NOTE — Anesthesia Postprocedure Evaluation (Signed)
Anesthesia Post Note  Patient: [name]  Procedure(s) Performed: CATARACT EXTRACTION PHACO AND INTRAOCULAR LENS PLACEMENT RIGHT EYE (CDE: 13.46) (Right Eye)  Patient location during evaluation: Short Stay Anesthesia Type: MAC Level of consciousness: awake and patient cooperative Pain management: pain level controlled Vital Signs Assessment: post-procedure vital signs reviewed and stable Respiratory status: spontaneous breathing, nonlabored ventilation and respiratory function stable Cardiovascular status: blood pressure returned to baseline Postop Assessment: no apparent nausea or vomiting Anesthetic complications: no     Last Vitals:  Vitals:   02/26/19 0808 02/26/19 0818  BP:  (!) 185/86  Pulse: 77   Resp: (!) (P) 28   Temp: 36.4 C   SpO2: 100%     Last Pain:  Vitals:   02/26/19 0818  TempSrc:   PainSc: 0-No pain                 Cortavious Nix J

## 2019-02-26 NOTE — Op Note (Signed)
Date of procedure: 02/26/19  Pre-operative diagnosis: Visually significant age-related cataract, Right Eye (H25.811)  Post-operative diagnosis: Visually significant age-related cataract, Right Eye  Procedure: Removal of cataract via phacoemulsification and insertion of intra-ocular lens [employer] PCB00  +20.0D into the capsular bag of the Right Eye  Attending surgeon: Gerda Diss. Ulonda Klosowski, MD, MA  Anesthesia: MAC, Topical [place]  Complications: None  Estimated Blood Loss: <30m (minimal)  Specimens: None  Implants: As above  Indications:  Visually significant age-related cataract, Right Eye  Procedure:  The patient was seen and identified in the pre-operative area. The operative eye was identified and dilated.  The operative eye was marked.  Topical anesthesia was administered to the operative eye.     The patient was then to the operative suite and placed in the supine position.  A timeout was performed confirming the patient, procedure to be performed, and all other relevant information.   The patient's face was prepped and draped in the usual fashion for intra-ocular surgery.  A lid speculum was placed into the operative eye and the surgical microscope moved into place and focused.  A superotemporal paracentesis was created using a 20 gauge paracentesis blade.  Shugarcaine was injected into the anterior chamber.  Viscoelastic was injected into the anterior chamber.  A temporal clear-corneal main wound incision was created using a 2.443mmicrokeratome.  A continuous curvilinear capsulorrhexis was initiated using an irrigating cystitome and completed using capsulorrhexis forceps.  Hydrodissection and hydrodeliniation were performed.  Viscoelastic was injected into the anterior chamber.  A phacoemulsification handpiece and a chopper as a second instrument were used to remove the nucleus and epinucleus. The irrigation/aspiration handpiece was used to remove any remaining cortical  material.   The capsular bag was reinflated with viscoelastic, checked, and found to be intact.  The intraocular lens was inserted into the capsular bag and dialed into place using a Kuglen hook.  The irrigation/aspiration handpiece was used to remove any remaining viscoelastic.  The clear corneal wound and paracentesis wounds were then hydrated and checked with Weck-Cels to be watertight.  The lid-speculum and drape was removed, and the patient's face was cleaned with a wet and dry 4x4.  Maxitrol was instilled in the eye before a clear shield was taped over the eye. The patient was taken to the post-operative care unit in good condition, having tolerated the procedure well.  Post-Op Instructions: [facility] and will receive all other orders and instructions.

## 2019-02-27 ENCOUNTER — Encounter (HOSPITAL_COMMUNITY): Payer: Self-pay | Admitting: Ophthalmology

## 2019-05-15 ENCOUNTER — Other Ambulatory Visit: Payer: Self-pay | Admitting: Cardiology

## 2019-06-27 ENCOUNTER — Emergency Department (HOSPITAL_COMMUNITY)
Admission: EM | Admit: 2019-06-27 | Discharge: 2019-06-27 | Disposition: A | Discharge status: Home / Self Care | Payer: Medicare Other | Attending: Emergency Medicine | Admitting: Emergency Medicine

## 2019-06-27 ENCOUNTER — Emergency Department (HOSPITAL_COMMUNITY): Payer: Medicare Other

## 2019-06-27 ENCOUNTER — Other Ambulatory Visit: Payer: Self-pay

## 2019-06-27 ENCOUNTER — Encounter (HOSPITAL_COMMUNITY): Payer: Self-pay | Admitting: Emergency Medicine

## 2019-06-27 DIAGNOSIS — I130 Hypertensive heart and chronic kidney disease with heart failure and stage 1 through stage 4 chronic kidney disease, or unspecified chronic kidney disease: Secondary | ICD-10-CM | POA: Insufficient documentation

## 2019-06-27 DIAGNOSIS — I480 Paroxysmal atrial fibrillation: Secondary | ICD-10-CM | POA: Diagnosis not present

## 2019-06-27 DIAGNOSIS — I2510 Atherosclerotic heart disease of native coronary artery without angina pectoris: Secondary | ICD-10-CM | POA: Diagnosis not present

## 2019-06-27 DIAGNOSIS — Z79899 Other long term (current) drug therapy: Secondary | ICD-10-CM | POA: Insufficient documentation

## 2019-06-27 DIAGNOSIS — E119 Type 2 diabetes mellitus without complications: Secondary | ICD-10-CM | POA: Diagnosis not present

## 2019-06-27 DIAGNOSIS — N183 Chronic kidney disease, stage 3 (moderate): Secondary | ICD-10-CM | POA: Insufficient documentation

## 2019-06-27 DIAGNOSIS — I5042 Chronic combined systolic (congestive) and diastolic (congestive) heart failure: Secondary | ICD-10-CM | POA: Insufficient documentation

## 2019-06-27 DIAGNOSIS — E039 Hypothyroidism, unspecified: Secondary | ICD-10-CM | POA: Insufficient documentation

## 2019-06-27 DIAGNOSIS — Z7984 Long term (current) use of oral hypoglycemic drugs: Secondary | ICD-10-CM | POA: Insufficient documentation

## 2019-06-27 DIAGNOSIS — J209 Acute bronchitis, unspecified: Secondary | ICD-10-CM | POA: Diagnosis not present

## 2019-06-27 DIAGNOSIS — Z8673 Personal history of transient ischemic attack (TIA), and cerebral infarction without residual deficits: Secondary | ICD-10-CM | POA: Diagnosis not present

## 2019-06-27 DIAGNOSIS — R05 Cough: Secondary | ICD-10-CM | POA: Diagnosis present

## 2019-06-27 DIAGNOSIS — Z7901 Long term (current) use of anticoagulants: Secondary | ICD-10-CM | POA: Insufficient documentation

## 2019-06-27 DIAGNOSIS — R51 Headache: Secondary | ICD-10-CM | POA: Diagnosis not present

## 2019-06-27 LAB — BASIC METABOLIC PANEL
Anion gap: 8 (ref 5–15)
BUN: 30 mg/dL — ABNORMAL HIGH (ref 8–23)
CO2: 24 mmol/L (ref 22–32)
Calcium: 9.4 mg/dL (ref 8.9–10.3)
Chloride: 108 mmol/L (ref 98–111)
Creatinine, Ser: 2.64 mg/dL — ABNORMAL HIGH (ref 0.44–1.00)
GFR calc Af Amer: 19 mL/min — ABNORMAL LOW (ref >60)
GFR calc non Af Amer: 16 mL/min — ABNORMAL LOW (ref >60)
Glucose, Bld: 121 mg/dL — ABNORMAL HIGH (ref 70–99)
Potassium: 4.3 mmol/L (ref 3.5–5.1)
Sodium: 140 mmol/L (ref 135–145)

## 2019-06-27 LAB — CBC WITH DIFFERENTIAL/PLATELET
Abs Immature Granulocytes: 0.01 10*3/uL (ref 0.00–0.07)
Basophils Absolute: 0 10*3/uL (ref 0.0–0.1)
Basophils Relative: 0 %
Eosinophils Absolute: 0.2 10*3/uL (ref 0.0–0.5)
Eosinophils Relative: 4 %
HCT: 35.9 % — ABNORMAL LOW (ref 36.0–46.0)
Hemoglobin: 10.9 g/dL — ABNORMAL LOW (ref 12.0–15.0)
Immature Granulocytes: 0 %
Lymphocytes Relative: 22 %
Lymphs Abs: 1.3 10*3/uL (ref 0.7–4.0)
MCH: 26.9 pg (ref 26.0–34.0)
MCHC: 30.4 g/dL (ref 30.0–36.0)
MCV: 88.6 fL (ref 80.0–100.0)
Monocytes Absolute: 0.7 10*3/uL (ref 0.1–1.0)
Monocytes Relative: 12 %
Neutro Abs: 3.7 10*3/uL (ref 1.7–7.7)
Neutrophils Relative %: 62 %
Platelets: 194 10*3/uL (ref 150–400)
RBC: 4.05 MIL/uL (ref 3.87–5.11)
RDW: 14.5 % (ref 11.5–15.5)
WBC: 5.9 10*3/uL (ref 4.0–10.5)
nRBC: 0 % (ref 0.0–0.2)

## 2019-06-27 LAB — BRAIN NATRIURETIC PEPTIDE: B Natriuretic Peptide: 79 pg/mL (ref 0.0–100.0)

## 2019-06-27 LAB — TROPONIN I (HIGH SENSITIVITY): Troponin I (High Sensitivity): 8 ng/L (ref <18)

## 2019-06-27 MED ORDER — AZITHROMYCIN 250 MG PO TABS: 250.0000 mg | ORAL_TABLET | Freq: Every day | ORAL | 0 refills | Status: DC

## 2019-06-27 MED ORDER — AZITHROMYCIN 250 MG PO TABS
500.0000 mg | ORAL_TABLET | Freq: Once | ORAL | Status: AC
  Administered 2019-06-27: 23:00:00 500 mg via ORAL
  Filled 2019-06-27: qty 2

## 2019-06-27 NOTE — ED Notes (Signed)
Patient visitor walked to nurse's station and rudely asked "When are you going to get to her about her xrays?" I told visitor that provider would come in when he could and that he was in another patient's room at this time. Visitor went back to pt room.

## 2019-06-27 NOTE — Discharge Instructions (Addendum)
Begin taking Zithromax as prescribed.  We will call you if your COVID test is positive.  Until the result is known, be sure to isolate at home and avoid others.  Return to the emergency department for severe chest pain, difficulty breathing, or other new and concerning symptoms.      Person Under Monitoring Name: [name]  Location: [place] [address]   Infection Prevention Recommendations for Individuals Confirmed to have, or Being Evaluated for, 2019 Novel Coronavirus (COVID-19) Infection Who Receive Care at Home  Individuals who are confirmed to have, or are being evaluated for, COVID-19 should follow the prevention steps below until a healthcare provider or local or state health department says they can return to normal activities.  Stay home except to get medical care You should restrict activities outside your home, except for getting medical care. Do not go to work, school, or public areas, and do not use public transportation or taxis.  Call ahead before visiting your doctor Before your medical appointment, call the healthcare provider and tell them that you have, or are being evaluated for, COVID-19 infection. This will help the healthcare providers office take steps to keep other people from getting infected. Ask your healthcare provider to call the local or state health department.  Monitor your symptoms Seek prompt medical attention if your illness is worsening (e.g., difficulty breathing). Before going to your medical appointment, call the healthcare provider and tell them that you have, or are being evaluated for, COVID-19 infection. Ask your healthcare provider to call the local or state health department.  Wear a facemask You should wear a facemask that covers your nose and mouth when you are in the same room with other people and when you visit a healthcare provider. People who live with or visit you should also wear a facemask  while they are in the same room with you.  Separate yourself from other people in your home As much as possible, you should stay in a different room from other people in your home. Also, you should use a separate bathroom, if available.  Avoid sharing household items You should not share dishes, drinking glasses, cups, eating utensils, towels, bedding, or other items with other people in your home. After using these items, you should wash them thoroughly with soap and water.  Cover your coughs and sneezes Cover your mouth and nose with a tissue when you cough or sneeze, or you can cough or sneeze into your sleeve. Throw used tissues in a lined trash can, and immediately wash your hands with soap and water for at least 20 seconds or use an alcohol-based hand rub.  Wash your [facility] your hands often and thoroughly with soap and water for at least 20 seconds. You can use an alcohol-based hand sanitizer if soap and water are not available and if your hands are not visibly dirty. Avoid touching your eyes, nose, and mouth with unwashed hands.   Prevention Steps for Caregivers and Household Members of Individuals Confirmed to have, or Being Evaluated for, COVID-19 Infection Being Cared for in the Home  If you live with, or provide care at home for, a person confirmed to have, or being evaluated for, COVID-19 infection please follow these guidelines to prevent infection:  Follow healthcare providers instructions Make sure that you understand and can help the patient follow any healthcare provider instructions for all care.  Provide for the patients basic needs You should help the patient  with basic needs in the home and provide support for getting groceries, prescriptions, and other personal needs.  Monitor the patients symptoms If they are getting sicker, call his or her medical provider and tell them that the patient has, or is being evaluated for, COVID-19 infection. This will  help the healthcare providers office take steps to keep other people from getting infected. Ask the healthcare provider to call the local or state health department.  Limit the number of people who have contact with the patient If possible, have only one caregiver for the patient. Other household members should stay in another home or place of residence. If this is not possible, they should stay in another room, or be separated from the patient as much as possible. Use a separate bathroom, if available. Restrict visitors who do not have an essential need to be in the home.  Keep older adults, very young children, and other sick people away from the patient Keep older adults, very young children, and those who have compromised immune systems or chronic health conditions away from the patient. This includes people with chronic heart, lung, or kidney conditions, diabetes, and cancer.  Ensure good ventilation Make sure that shared spaces in the home have good air flow, such as from an air conditioner or an opened window, weather permitting.  Wash your hands often Wash your hands often and thoroughly with soap and water for at least 20 seconds. You can use an alcohol based hand sanitizer if soap and water are not available and if your hands are not visibly dirty. Avoid touching your eyes, nose, and mouth with unwashed hands. Use disposable paper towels to dry your hands. If not available, use dedicated cloth towels and replace them when they become wet.  Wear a facemask and gloves Wear a disposable facemask at all times in the room and gloves when you touch or have contact with the patients blood, body fluids, and/or secretions or excretions, such as sweat, saliva, sputum, nasal mucus, vomit, urine, or feces.  Ensure the mask fits over your nose and mouth tightly, and do not touch it during use. Throw out disposable facemasks and gloves after using them. Do not reuse. Wash your hands immediately  after removing your facemask and gloves. If your personal clothing becomes contaminated, carefully remove clothing and launder. Wash your hands after handling contaminated clothing. Place all used disposable facemasks, gloves, and other waste in a lined container before disposing them with other household waste. Remove gloves and wash your hands immediately after handling these items.  Do not share dishes, glasses, or other household items with the patient Avoid sharing household items. You should not share dishes, drinking glasses, cups, eating utensils, towels, bedding, or other items with a patient who is confirmed to have, or being evaluated for, COVID-19 infection. After the person uses these items, you should wash them thoroughly with soap and water.  Wash laundry thoroughly Immediately remove and wash clothes or bedding that have blood, body fluids, and/or secretions or excretions, such as sweat, saliva, sputum, nasal mucus, vomit, urine, or feces, on them. Wear gloves when handling laundry from the patient. Read and follow directions on labels of laundry or clothing items and detergent. In general, wash and dry with the warmest temperatures recommended on the label.  Clean all areas the individual has used often Clean all touchable surfaces, such as counters, tabletops, doorknobs, bathroom fixtures, toilets, phones, keyboards, tablets, and bedside tables, every day. Also, clean any surfaces that may  have blood, body fluids, and/or secretions or excretions on them. Wear gloves when cleaning surfaces the patient has come in contact with. Use a diluted bleach solution (e.g., dilute bleach with 1 part bleach and 10 parts water) or a household disinfectant with a label that says EPA-registered for coronaviruses. To make a bleach solution at home, add 1 tablespoon of bleach to 1 quart (4 cups) of water. For a larger supply, add  cup of bleach to 1 gallon (16 cups) of water. Read labels of  cleaning products and follow recommendations provided on product labels. Labels contain instructions for safe and effective use of the cleaning product including precautions you should take when applying the product, such as wearing gloves or eye protection and making sure you have good ventilation during use of the product. Remove gloves and wash hands immediately after cleaning.  Monitor yourself for signs and symptoms of illness Caregivers and household members are considered close contacts, should monitor their health, and will be asked to limit movement outside of the home to the extent possible. Follow the monitoring steps for close contacts listed on the symptom monitoring form.   ? If you have additional questions, contact your local health department or call the epidemiologist on call at [phone] (available 24/7). ? This guidance is subject to change. For the most up-to-date guidance from [facility], please refer to their website: [email or web address]

## 2019-06-27 NOTE — ED Provider Notes (Addendum)
[facility] EMERGENCY DEPARTMENT Provider Note   CSN: [id number] Arrival date & time: 06/27/19  [place]     History   Chief Complaint Chief Complaint  Patient presents with  . Headache  . Cough    HPI [name] is a 83 y.o. female.     Patient is an 83 year old female with history of diabetes hypothyroidism, nonischemic cardiomyopathy, paroxysmal A. fib.  She presents today for evaluation of headache and cough.  This is been ongoing for the past several days.  Cough is intermittently productive of green sputum.  She denies any chest discomfort or difficulty breathing.  She denies any fevers, chills, or ill contacts.  The history is provided by the patient.  Cough Cough characteristics:  Productive Sputum characteristics:  Green Severity:  Moderate Onset quality:  Gradual Duration:  3 days Timing:  Constant Progression:  Worsening Chronicity:  New Relieved by:  Nothing Worsened by:  Nothing Ineffective treatments:  None tried   Past Medical History:  Diagnosis Date  . Asthma   . Bronchitis   . Carotid artery disease ([place])   . Coronary atherosclerosis of native coronary artery    Nonobstructive at cath 2005  . DM2 (diabetes mellitus, type 2) ([place])   . Essential hypertension   . Gout   . History of stroke   . Hypothyroidism   . Influenza 2013  . Nonischemic cardiomyopathy (H[facility])    LVEF 20-25%  . PAF (paroxysmal atrial fibrillation) ([place])    a. diagnosed in 04/2018. Started on Eliquis for anticoagulation.   . Renal artery stenosis [facility])    Bilateral    Patient Active Problem List   Diagnosis Date Noted  . Syncope and collapse 05/17/2018  . Chronic combined systolic and diastolic CHF (congestive heart failure) ([place]) 05/16/2018  . Acute renal failure superimposed on stage 3 chronic kidney disease ([place]) 05/16/2018  . Atrial fibrillation, chronic 05/14/2018  . Atrial fibrillation with RVR ([place]) 04/24/2018  . Hypokalemia 04/24/2018  . Acute exacerbation of  CHF (congestive heart failure) ([place]) 09/01/2015  . CHF (congestive heart failure) ([place]) 07/02/2015  . Elevated troponin I level 07/02/2015  . Right bundle branch block 07/02/2015  . Renal insufficiency 07/02/2015  . CAP (community acquired pneumonia) 04/25/2015  . Acute respiratory failure with hypoxia ([place]) 04/25/2015  . Acute renal injury ([place]) 04/25/2015  . Type 2 diabetes with nephropathy ([place]) 04/25/2015  . ARF (acute renal failure) ([place]) 06/18/2014  . Mitral regurgitation 06/09/2014  . Dyspnea 06/06/2014  . Asthma exacerbation 06/06/2014  . Acute on chronic combined systolic and diastolic CHF (congestive heart failure) ([place]) 06/06/2014  . Influenza 12/10/2012  . Type 2 diabetes mellitus with hyperlipidemia ([place]) 07/08/2010  . Essential hypertension 04/06/2010  . CORONARY ATHEROSCLEROSIS NATIVE CORONARY ARTERY 04/06/2010  . CARDIOMYOPATHY 03/29/2010  . CVA (cerebral vascular accident) ([place]) 03/29/2010    Past Surgical History:  Procedure Laterality Date  . ABDOMINAL HYSTERECTOMY    . CATARACT EXTRACTION W/PHACO Left 01/18/2019   Procedure: CATARACT EXTRACTION PHACO AND INTRAOCULAR LENS PLACEMENT ([place]);  Surgeon: Baruch Goldmann, MD;  Location: [facility];  Service: Ophthalmology;  Laterality: Left;  CDE: 11.99  . CATARACT EXTRACTION W/PHACO Right 02/26/2019   Procedure: CATARACT EXTRACTION PHACO AND INTRAOCULAR LENS PLACEMENT RIGHT EYE (CDE: 13.46);  Surgeon: Baruch Goldmann, MD;  Location: [facility];  Service: Ophthalmology;  Laterality: Right;     OB History    Gravida  15   Para  15   Term  15   Preterm  AB      Living        SAB      TAB      Ectopic      Multiple      Live Births               Home Medications    Prior to Admission medications   Medication Sig Start Date End Date Taking? Authorizing Provider  allopurinol (ZYLOPRIM) 100 MG tablet Take 100 mg by mouth daily.  07/01/17   [provider]  apixaban (ELIQUIS) 2.5 MG TABS tablet  Take 1 tablet (2.5 mg total) by mouth 2 (two) times daily. 02/07/19   Satira Sark, MD  atorvastatin (LIPITOR) 20 MG tablet Take 1 tablet (20 mg total) by mouth daily at 6 PM. Patient taking differently: Take 20 mg by mouth daily at 12 noon.  07/09/18   Satira Sark, MD  benzonatate (TESSALON) 100 MG capsule Take 100 mg by mouth every 8 (eight) hours as needed for cough.  10/25/18   [provider]  carvedilol (COREG) 12.5 MG tablet TAKE 1 TABLET(12.5 MG) BY MOUTH TWICE DAILY WITH A MEAL 05/15/19   Satira Sark, MD  cholecalciferol (VITAMIN D) 25 MCG (1000 UT) tablet Take 1,000 Units by mouth 2 (two) times daily.    [provider]  donepezil (ARICEPT) 10 MG tablet Take 10 mg by mouth daily.    [provider]  doxycycline (VIBRA-TABS) 100 MG tablet Take 100 mg by mouth 2 (two) times daily.    [provider]  fluticasone (FLONASE) 50 MCG/ACT nasal spray Place 1 spray into both nostrils daily as needed for allergies or rhinitis.  01/03/19   [provider]  furosemide (LASIX) 40 MG tablet Take 80 mg by mouth daily.    [provider]  gabapentin (NEURONTIN) 100 MG capsule Take 100 mg by mouth 3 (three) times daily.     [provider]  hydrALAZINE (APRESOLINE) 50 MG tablet Take 1.5 tablets (75 mg total) by mouth every 8 (eight) hours. 04/27/18   Barton Dubois, MD  ILEVRO 0.3 % ophthalmic suspension Place 1 drop into the left eye See admin instructions. Apply one drop into operative eye once daily starting 2 days before surgery and continue as directed by prescriber 12/14/18   [provider]  isosorbide mononitrate (IMDUR) 30 MG 24 hr tablet Take 1 tablet (30 mg total) by mouth daily. 06/29/18   Satira Sark, MD  moxifloxacin (VIGAMOX) 0.5 % ophthalmic solution Place 1 drop into the left eye See admin instructions. Instill one drop into operative eye three times daily starting 2 days before surgery and continue as  directed. 12/14/18   [provider]  oseltamivir (TAMIFLU) 75 MG capsule Take 1 capsule (75 mg total) by mouth every 12 (twelve) hours. 01/22/19   Maudie Flakes, MD  pioglitazone (ACTOS) 15 MG tablet Take 15 mg by mouth daily with breakfast.  08/12/17   [provider]  sitaGLIPtin (JANUVIA) 50 MG tablet Take 50 mg by mouth daily.    [provider]    Family History Family History  Problem Relation Age of Onset  . Asthma Son     Social History Social History   Tobacco Use  . Smoking status: Never Smoker  . Smokeless tobacco: Never Used  Substance Use Topics  . Alcohol use: No    Alcohol/week: 0.0 standard drinks  . Drug use: No  Allergies   Yellow jacket venom [bee venom]   Review of Systems Review of Systems  Respiratory: Positive for cough.   All other systems reviewed and are negative.    Physical Exam Updated Vital Signs BP 130/69 (BP Location: Right Arm)   Pulse 73   Temp 98 F (36.7 C) (Oral)   Resp 14   Ht 5\' 3"  (1.6 m)   Wt 84.4 kg   SpO2 94%   BMI 32.95 kg/m   Physical Exam Vitals signs and nursing note reviewed.  Constitutional:      General: She is not in acute distress.    Appearance: She is well-developed. She is not diaphoretic.  HENT:     Head: Normocephalic and atraumatic.  Neck:     Musculoskeletal: Normal range of motion and neck supple.  Cardiovascular:     Rate and Rhythm: Normal rate and regular rhythm.     Heart sounds: No murmur. No friction rub. No gallop.   Pulmonary:     Effort: Pulmonary effort is normal. No respiratory distress.     Breath sounds: Normal breath sounds. No wheezing.  Abdominal:     General: Bowel sounds are normal. There is no distension.     Palpations: Abdomen is soft.     Tenderness: There is no abdominal tenderness.  Musculoskeletal: Normal range of motion.        General: No swelling or tenderness.  Skin:    General: Skin is warm and dry.  Neurological:     Mental  Status: She is alert and oriented to person, place, and time.      ED Treatments / Results  Labs (all labs ordered are listed, but only abnormal results are displayed) Labs Reviewed  BASIC METABOLIC PANEL  CBC WITH DIFFERENTIAL/PLATELET  BRAIN NATRIURETIC PEPTIDE  TROPONIN I (HIGH SENSITIVITY)    EKG None  Radiology No results found.  Procedures Procedures (including critical care time)  Medications Ordered in ED Medications - No data to display   Initial Impression / Assessment and Plan / ED Course  I have reviewed the triage vital signs and the nursing notes.  Pertinent labs & imaging results that were available during my care of the patient were reviewed by me and considered in my medical decision making (see chart for details).  Patient presenting with complaints of headache and cough for the past several days.  Patient's symptoms appear to be related to possible sinusitis.  She will be treated with Zithromax and PRN return.  Her work-up reveals no cardiac etiology and no evidence for pneumonia on the x-ray.  COVID swab will be obtained and sent as an outpatient.  Until then, patient is to isolate at home.  [name] was evaluated in Emergency Department on 06/27/2019 for the symptoms described in the history of present illness. She was evaluated in the context of the global COVID-19 pandemic, which necessitated consideration that the patient might be at risk for infection with the SARS-CoV-2 virus that causes COVID-19. Institutional protocols and algorithms that pertain to the evaluation of patients at risk for COVID-19 are in a state of rapid change based on information released by regulatory bodies including the CDC and federal and state organizations. These policies and algorithms were followed during the patient's care in the ED.   Final Clinical Impressions(s) / ED Diagnoses   Final diagnoses:  None    ED Discharge Orders    None       Veryl Speak,  MD  06/27/19 [id number]    Veryl Speak, MD 06/27/19 2249

## 2019-06-27 NOTE — ED Triage Notes (Signed)
Pt states that she has headache for 2 days and cough for 3 days

## 2019-07-30 IMAGING — DX DG CHEST 2V
2 series · 2 of 2 positions shown · non-contrast
Comparison: Chest radiograph 04/24/2018

CLINICAL DATA: Altered mental status.

EXAM:
CHEST - 2 VIEW

[chest pa]
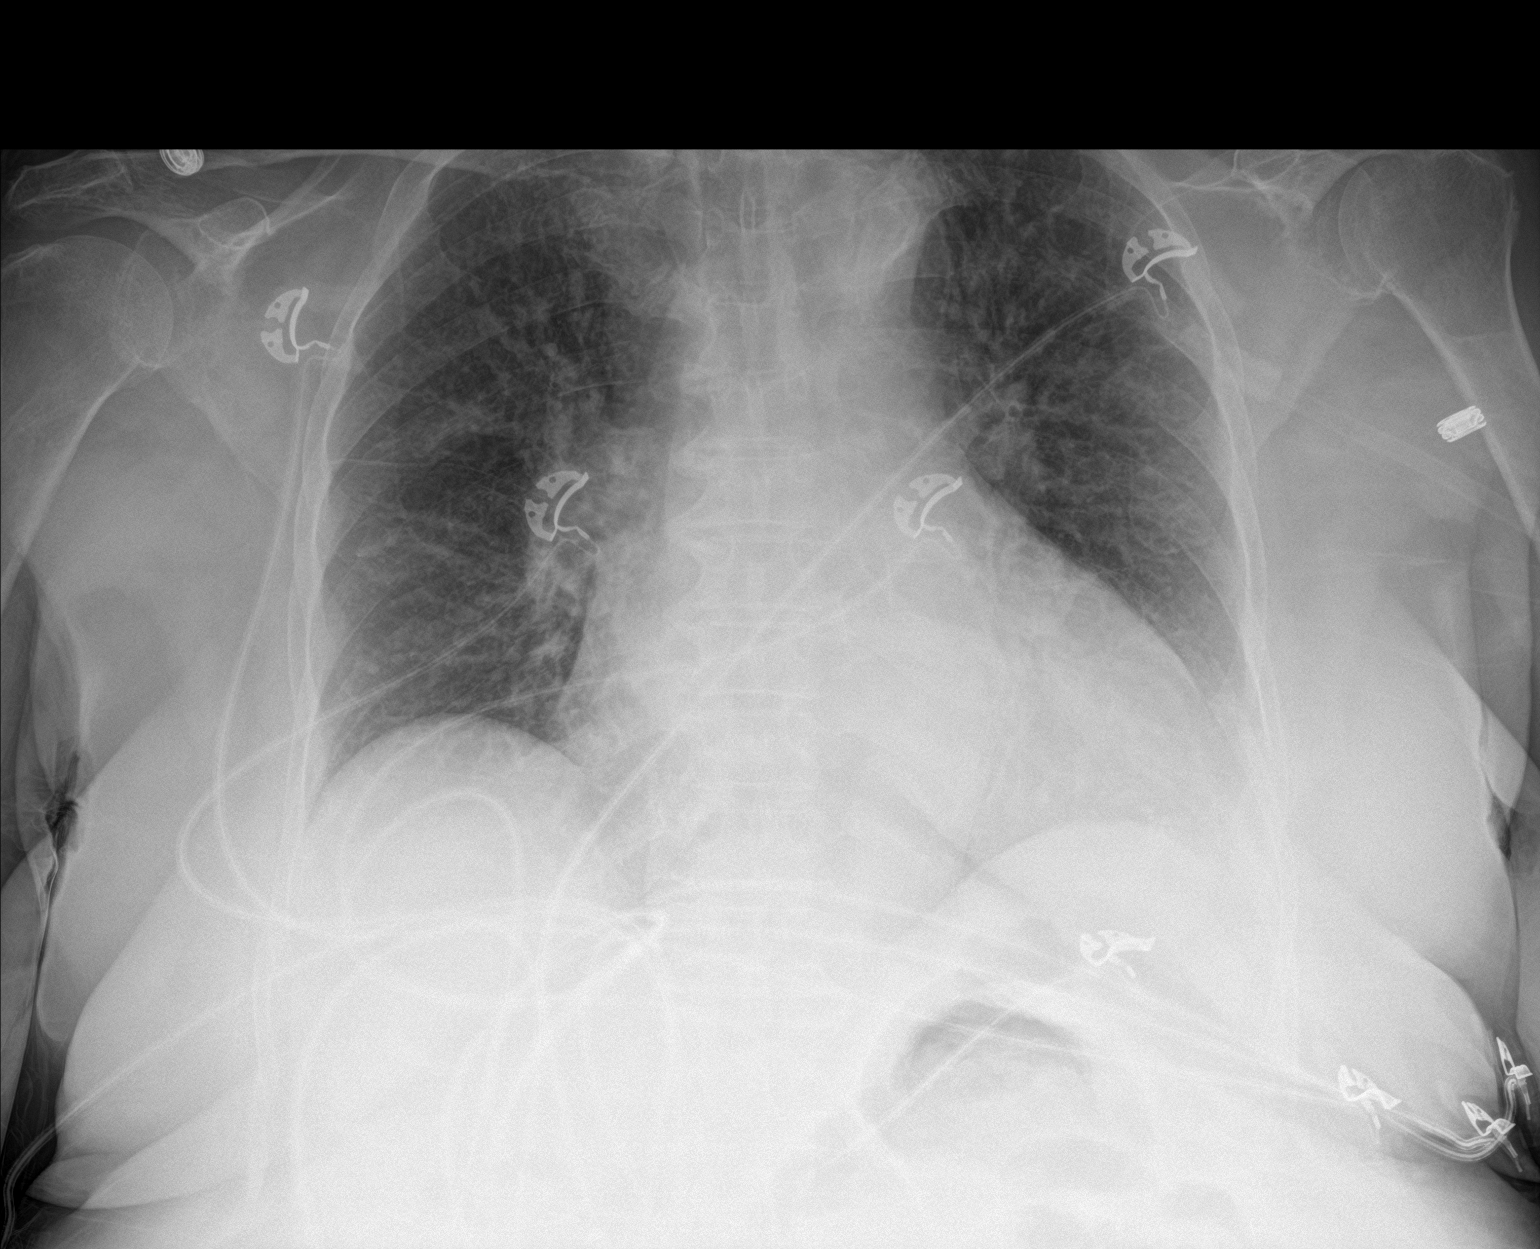

[chest lat]
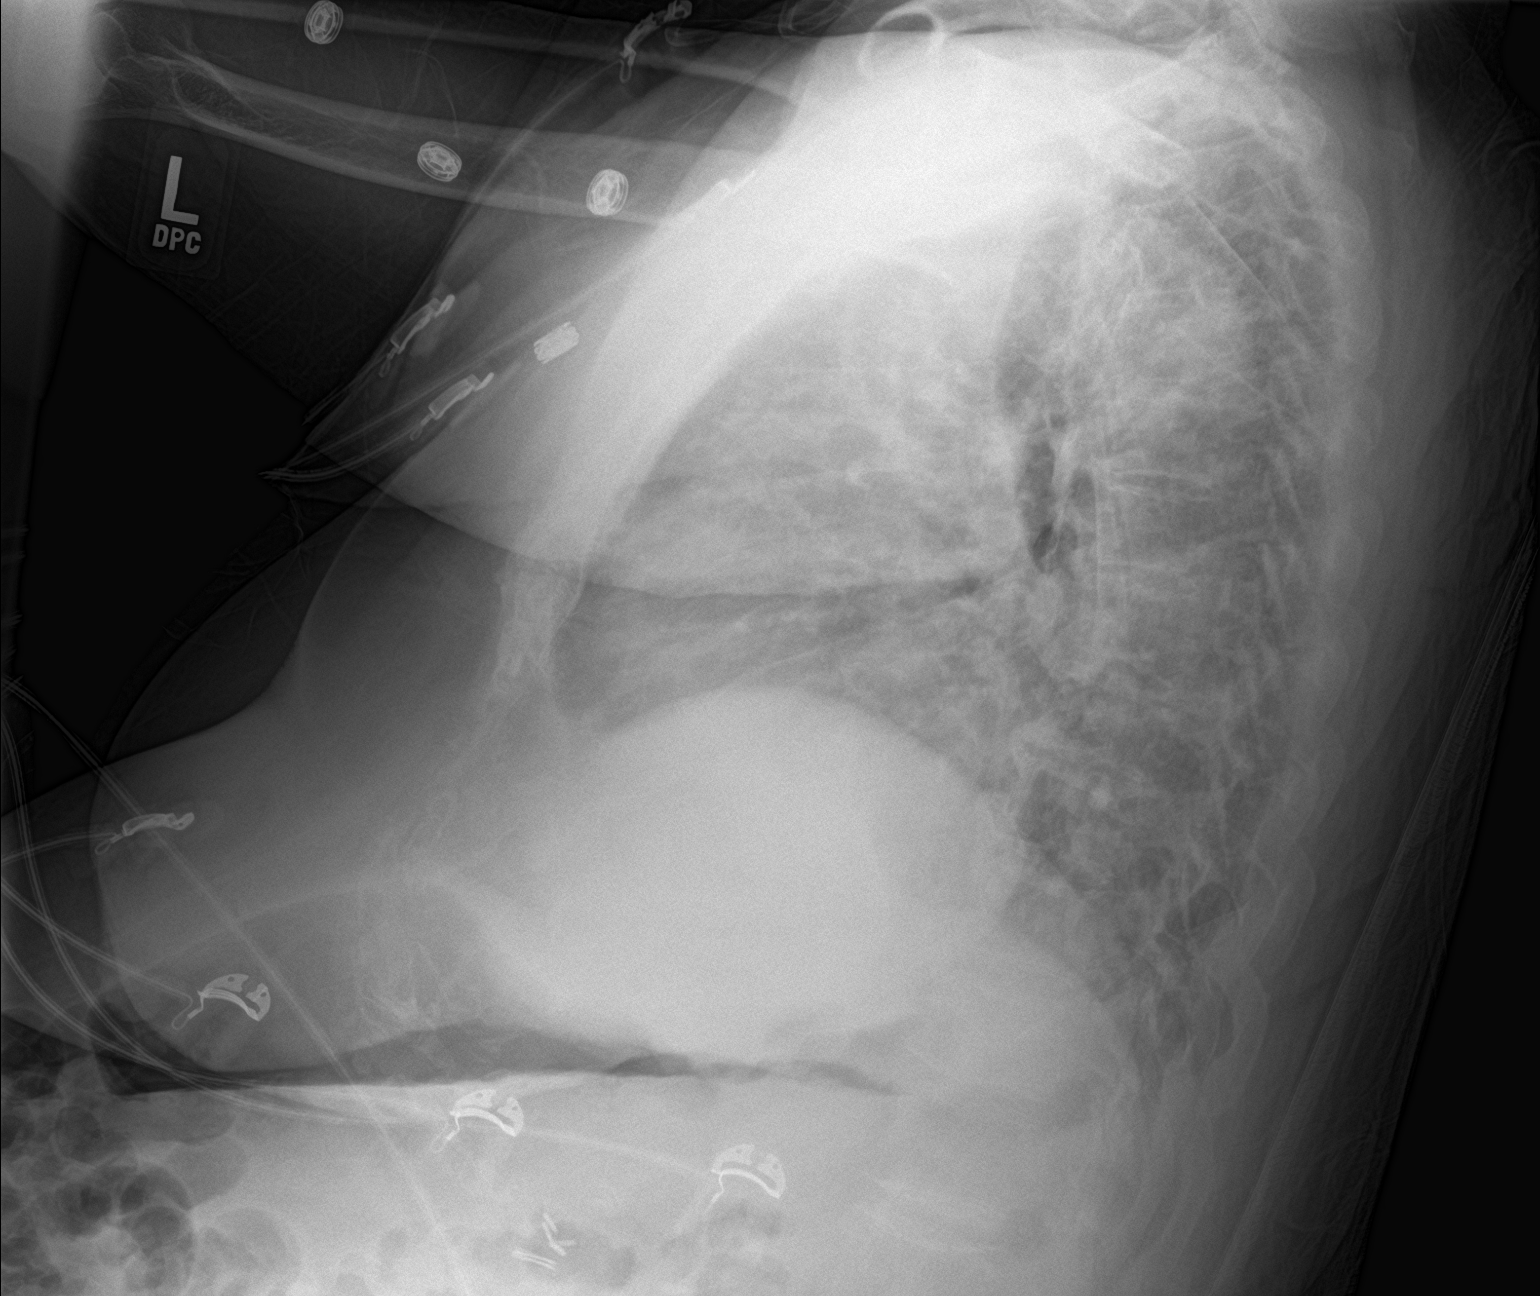

[2 of 2 positions shown; findings below may reference images not displayed]

FINDINGS: Stable enlarged cardiac silhouette. Low lung volumes. No effusion,
infiltrate pneumothorax. Chronic bronchitic markings noted. No acute
osseous abnormality.
IMPRESSION: Cardiomegaly without acute cardiopulmonary findings.

## 2019-07-30 IMAGING — CT CT HEAD W/O CM
3 series · 16 of 47 positions shown, 19 images · non-contrast
Comparison: CT head dated June 18, 2014.

CLINICAL DATA: Altered mental status.

EXAM:
CT HEAD WITHOUT CONTRAST
TECHNIQUE: Contiguous axial images were obtained from the base of the skull
through the vertex without intravenous contrast.

[Series 2: head trauma wo · axial · 0.42mm/px · z∈[+1235,+1365]mm · 10 of 32 slices shown, 13 images]
[im 3/32  brain]
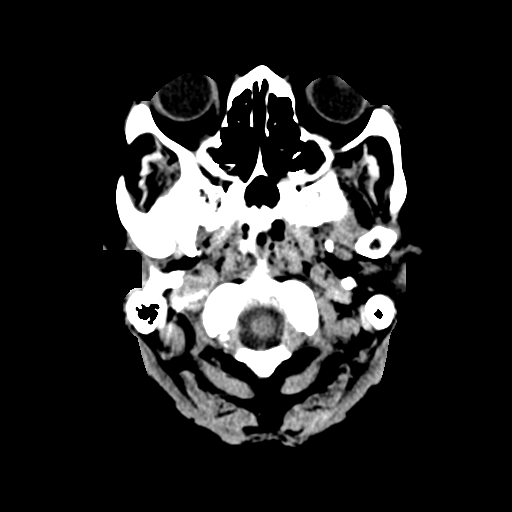
[im 3/32  bone]
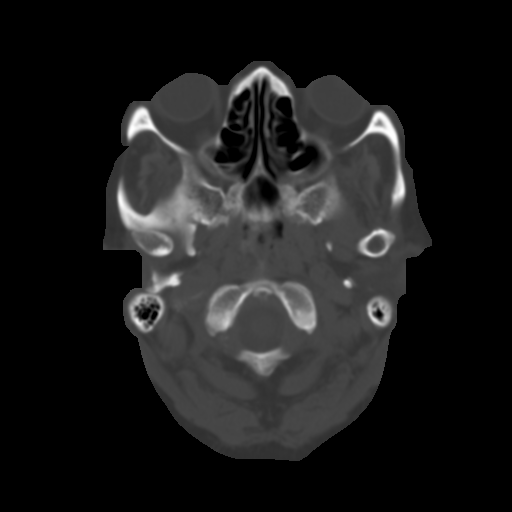
[im 6/32  brain]
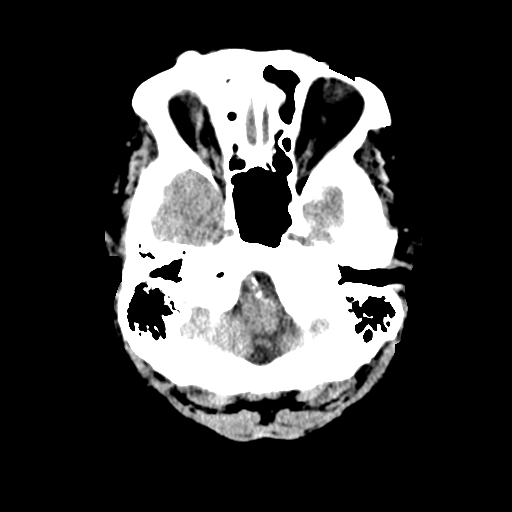
[im 9/32  brain]
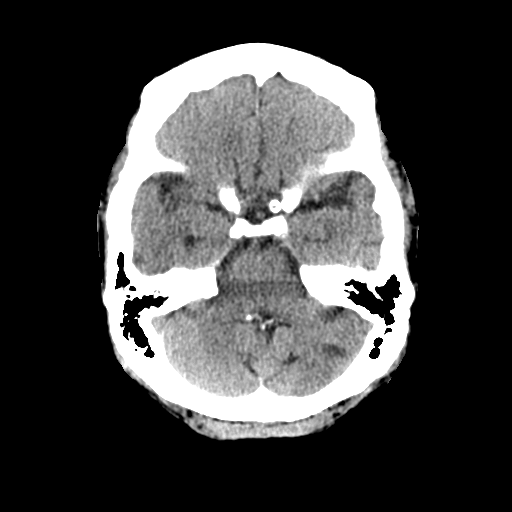
[im 11/32  brain]
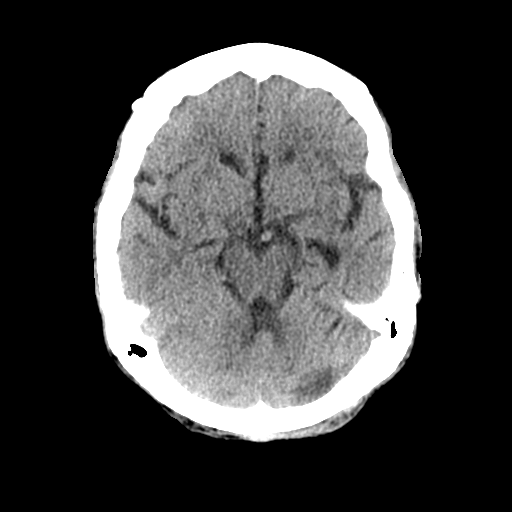
[im 14/32  brain]
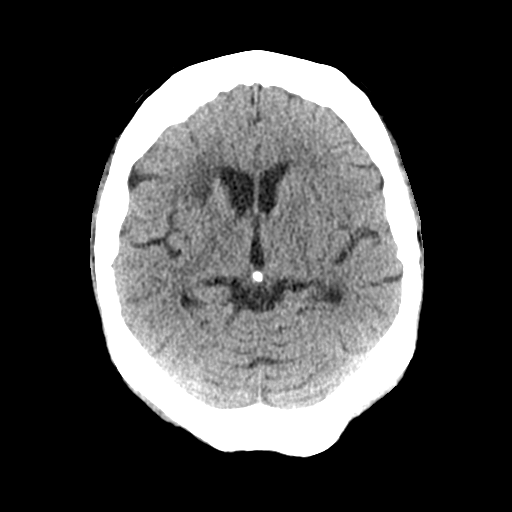
[im 14/32  bone]
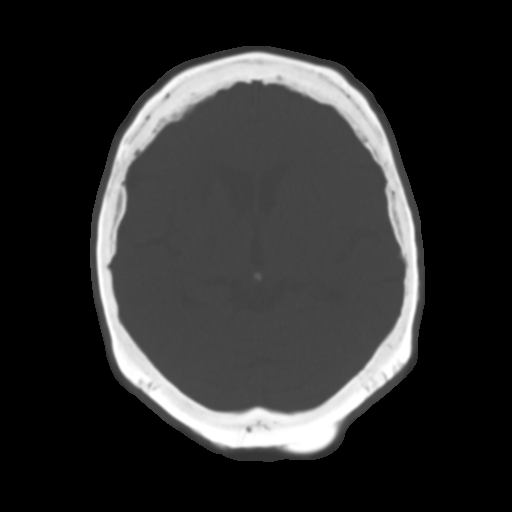
[im 18/32  brain]
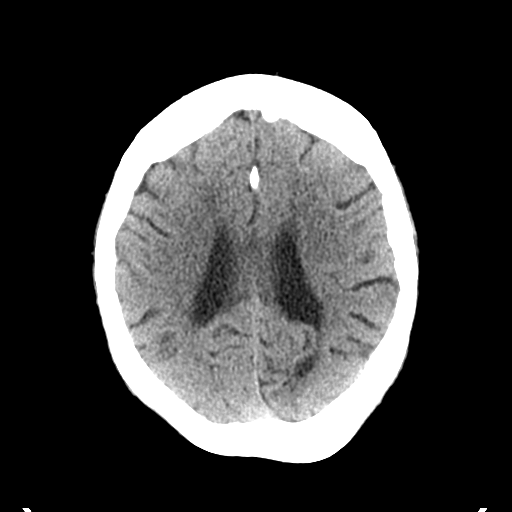
[im 21/32  brain]
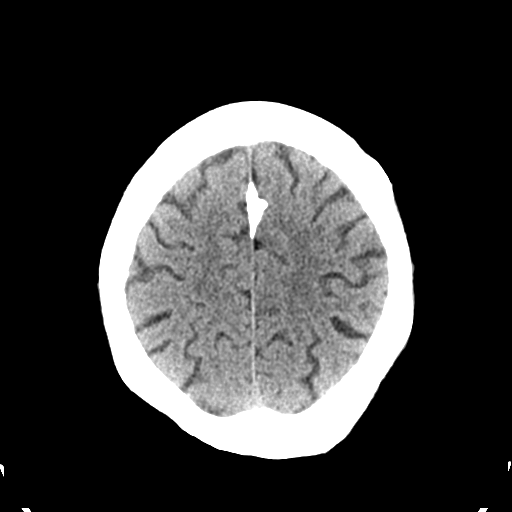
[im 24/32  brain]
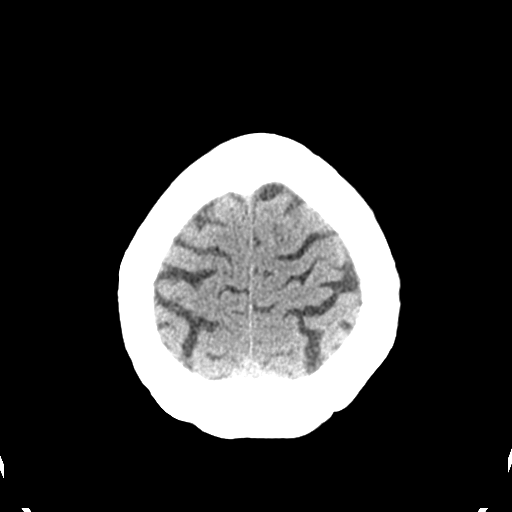
[im 26/32  brain]
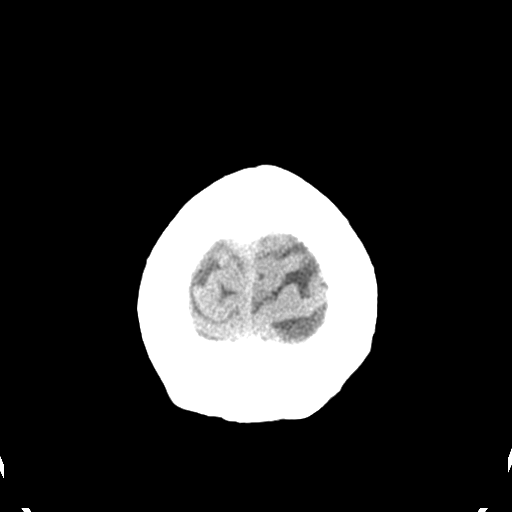
[im 26/32  bone]
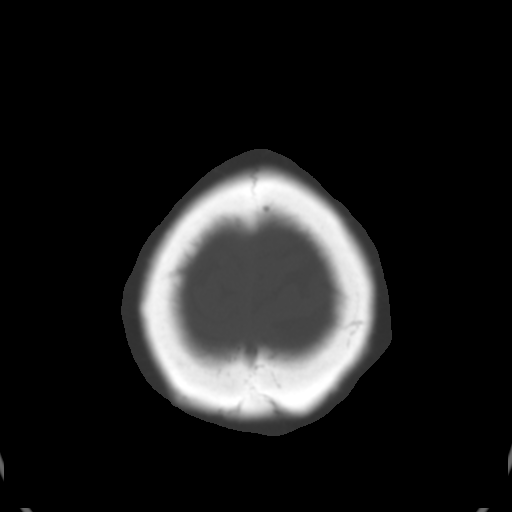
[im 29/32  brain]
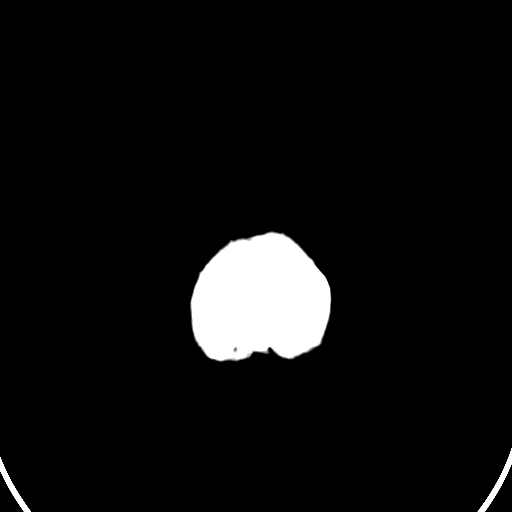

[Series 4: coronal soft tissue · coronal · 0.36mm/px · 3 of 68 slices shown]
[im 23/68  brain]
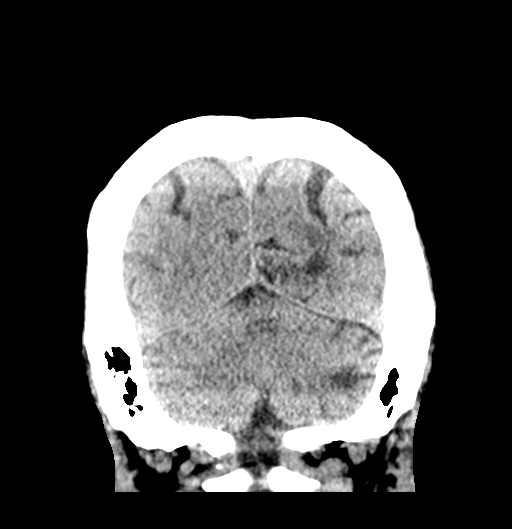
[im 30/68  brain]
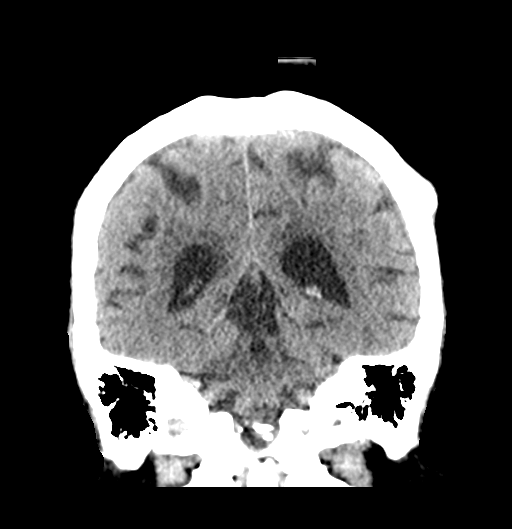
[im 38/68  brain]
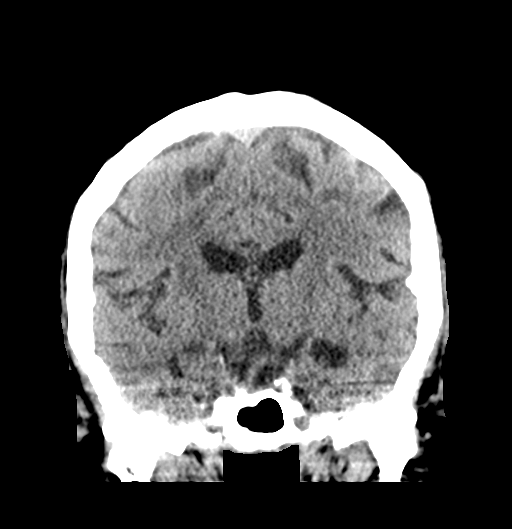

[Series 5: sagittal soft tissue · sagittal · 0.35mm/px · 3 of 60 slices shown]
[im 20/60  brain]
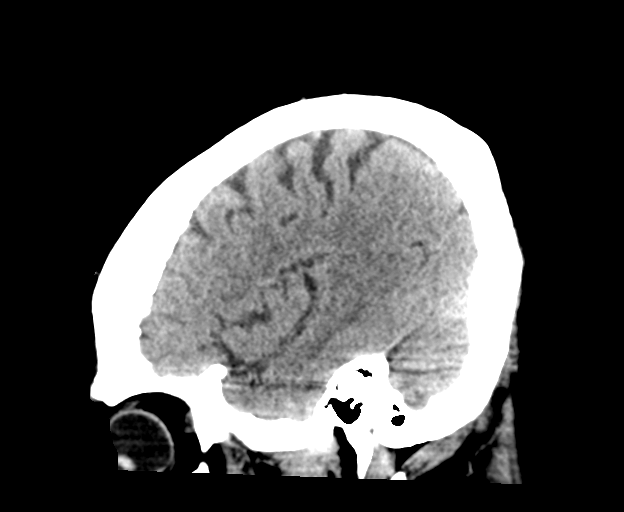
[im 30/60  brain]
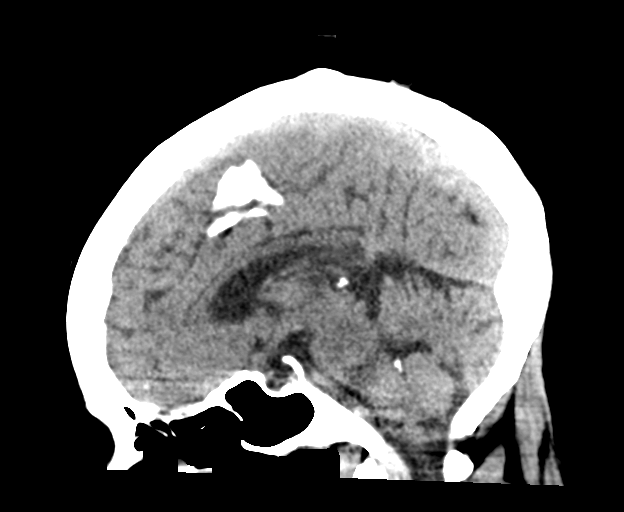
[im 40/60  brain]
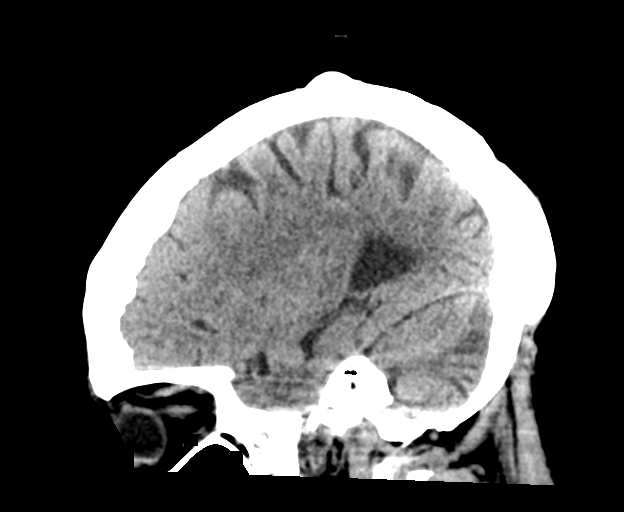

[16 of 47 positions shown; findings below may reference images not displayed]

FINDINGS: Brain: New hypodensity within the left cerebellum. Old infarct
within the left occipital lobe. Progressive periventricular white
matter hypodensity along the right frontal horn likely due to
chronic microvascular ischemic changes. Stable mild cerebral
atrophy. No evidence of hemorrhage, hydrocephalus, extra-axial
collection or mass lesion/mass effect.

Vascular: Calcified atherosclerosis at the skullbase. No hyperdense
vessel.

Skull: Negative for fracture or focal lesion. Diffuse calvarial
thickening is unchanged.

Sinuses/Orbits: No acute finding.

Other: None.
IMPRESSION: 1. Findings concerning for acute left cerebellar infarct.

These results were called by telephone on 05/14/2018 at [DATE] to Dr.
SHORIFULHOK MAZEAU , who verbally acknowledged these results.

## 2019-09-09 ENCOUNTER — Other Ambulatory Visit (HOSPITAL_COMMUNITY)
Admission: RE | Admit: 2019-09-09 | Discharge: 2019-09-09 | Disposition: A | Discharge status: Home / Self Care | Payer: Medicare Other | Source: Ambulatory Visit | Attending: Nephrology | Admitting: Nephrology

## 2019-09-09 DIAGNOSIS — I10 Essential (primary) hypertension: Secondary | ICD-10-CM | POA: Diagnosis present

## 2019-09-09 DIAGNOSIS — Z79899 Other long term (current) drug therapy: Secondary | ICD-10-CM | POA: Diagnosis present

## 2019-09-09 DIAGNOSIS — D649 Anemia, unspecified: Secondary | ICD-10-CM | POA: Insufficient documentation

## 2019-09-09 DIAGNOSIS — R809 Proteinuria, unspecified: Secondary | ICD-10-CM | POA: Diagnosis present

## 2019-09-09 DIAGNOSIS — N183 Chronic kidney disease, stage 3 (moderate): Secondary | ICD-10-CM | POA: Diagnosis present

## 2019-09-09 DIAGNOSIS — I509 Heart failure, unspecified: Secondary | ICD-10-CM | POA: Insufficient documentation

## 2019-09-09 LAB — IRON AND TIBC
Iron: 49 ug/dL (ref 28–170)
Saturation Ratios: 15 % (ref 10.4–31.8)
TIBC: 336 ug/dL (ref 250–450)
UIBC: 287 ug/dL

## 2019-09-09 LAB — RENAL FUNCTION PANEL
Albumin: 3.9 g/dL (ref 3.5–5.0)
Anion gap: 10 (ref 5–15)
BUN: 40 mg/dL — ABNORMAL HIGH (ref 8–23)
CO2: 21 mmol/L — ABNORMAL LOW (ref 22–32)
Calcium: 9.5 mg/dL (ref 8.9–10.3)
Chloride: 109 mmol/L (ref 98–111)
Creatinine, Ser: 3.09 mg/dL — ABNORMAL HIGH (ref 0.44–1.00)
GFR calc Af Amer: 16 mL/min — ABNORMAL LOW (ref >60)
GFR calc non Af Amer: 14 mL/min — ABNORMAL LOW (ref >60)
Glucose, Bld: 142 mg/dL — ABNORMAL HIGH (ref 70–99)
Phosphorus: 3.4 mg/dL (ref 2.5–4.6)
Potassium: 4 mmol/L (ref 3.5–5.1)
Sodium: 140 mmol/L (ref 135–145)

## 2019-09-09 LAB — HEMOGLOBIN AND HEMATOCRIT, BLOOD
HCT: 40.5 % (ref 36.0–46.0)
Hemoglobin: 11.9 g/dL — ABNORMAL LOW (ref 12.0–15.0)

## 2019-09-09 LAB — PROTEIN / CREATININE RATIO, URINE
Creatinine, Urine: 258.84 mg/dL
Protein Creatinine Ratio: 0.14 mg/mg{Cre} (ref 0.00–0.15)
Total Protein, Urine: 35 mg/dL

## 2019-09-09 LAB — FERRITIN: Ferritin: 73 ng/mL (ref 11–307)

## 2019-09-10 LAB — VITAMIN D 25 HYDROXY (VIT D DEFICIENCY, FRACTURES): Vit D, 25-Hydroxy: 38.8 ng/mL (ref 30.0–100.0)

## 2019-09-16 ENCOUNTER — Other Ambulatory Visit: Payer: Self-pay

## 2019-09-16 MED ORDER — ISOSORBIDE MONONITRATE ER 30 MG PO TB24: 30.0000 mg | ORAL_TABLET | Freq: Every day | ORAL | 3 refills | Status: DC

## 2019-09-16 NOTE — Telephone Encounter (Signed)
Refilled imdur 

## 2019-10-10 ENCOUNTER — Other Ambulatory Visit (HOSPITAL_COMMUNITY)
Admission: RE | Admit: 2019-10-10 | Discharge: 2019-10-10 | Disposition: A | Discharge status: Home / Self Care | Payer: Medicare Other | Source: Ambulatory Visit | Attending: Nephrology | Admitting: Nephrology

## 2019-10-10 DIAGNOSIS — D631 Anemia in chronic kidney disease: Secondary | ICD-10-CM | POA: Insufficient documentation

## 2019-10-10 DIAGNOSIS — Z79899 Other long term (current) drug therapy: Secondary | ICD-10-CM | POA: Diagnosis present

## 2019-10-10 LAB — RENAL FUNCTION PANEL
Albumin: 3.8 g/dL (ref 3.5–5.0)
Anion gap: 11 (ref 5–15)
BUN: 20 mg/dL (ref 8–23)
CO2: 24 mmol/L (ref 22–32)
Calcium: 9.9 mg/dL (ref 8.9–10.3)
Chloride: 109 mmol/L (ref 98–111)
Creatinine, Ser: 1.7 mg/dL — ABNORMAL HIGH (ref 0.44–1.00)
GFR calc Af Amer: 32 mL/min — ABNORMAL LOW (ref >60)
GFR calc non Af Amer: 28 mL/min — ABNORMAL LOW (ref >60)
Glucose, Bld: 125 mg/dL — ABNORMAL HIGH (ref 70–99)
Phosphorus: 2.9 mg/dL (ref 2.5–4.6)
Potassium: 4.2 mmol/L (ref 3.5–5.1)
Sodium: 144 mmol/L (ref 135–145)

## 2019-10-10 LAB — CBC WITH DIFFERENTIAL/PLATELET
Abs Immature Granulocytes: 0.01 10*3/uL (ref 0.00–0.07)
Basophils Absolute: 0 10*3/uL (ref 0.0–0.1)
Basophils Relative: 0 %
Eosinophils Absolute: 0.1 10*3/uL (ref 0.0–0.5)
Eosinophils Relative: 2 %
HCT: 39.8 % (ref 36.0–46.0)
Hemoglobin: 11.9 g/dL — ABNORMAL LOW (ref 12.0–15.0)
Immature Granulocytes: 0 %
Lymphocytes Relative: 32 %
Lymphs Abs: 2 10*3/uL (ref 0.7–4.0)
MCH: 26.7 pg (ref 26.0–34.0)
MCHC: 29.9 g/dL — ABNORMAL LOW (ref 30.0–36.0)
MCV: 89.4 fL (ref 80.0–100.0)
Monocytes Absolute: 0.5 10*3/uL (ref 0.1–1.0)
Monocytes Relative: 8 %
Neutro Abs: 3.7 10*3/uL (ref 1.7–7.7)
Neutrophils Relative %: 58 %
Platelets: 238 10*3/uL (ref 150–400)
RBC: 4.45 MIL/uL (ref 3.87–5.11)
RDW: 13.8 % (ref 11.5–15.5)
WBC: 6.3 10*3/uL (ref 4.0–10.5)
nRBC: 0 % (ref 0.0–0.2)

## 2019-10-10 LAB — PROTEIN / CREATININE RATIO, URINE
Creatinine, Urine: 152.84 mg/dL
Protein Creatinine Ratio: 0.12 mg/mg{Cre} (ref 0.00–0.15)
Total Protein, Urine: 18 mg/dL

## 2019-10-30 ENCOUNTER — Telehealth: Payer: Self-pay | Admitting: Cardiology

## 2019-10-30 NOTE — Telephone Encounter (Signed)

## 2019-10-31 NOTE — Progress Notes (Signed)
Virtual Visit via Telephone Note   This visit type was conducted due to national recommendations for restrictions regarding the COVID-19 Pandemic (e.g. social distancing) in an effort to limit this patient's exposure and mitigate transmission in our community.  Due to her co-morbid illnesses, this patient is at least at moderate risk for complications without adequate follow up.  This format is felt to be most appropriate for this patient at this time.  The patient did not have access to video technology/had technical difficulties with video requiring transitioning to audio format only (telephone).  All issues noted in this document were discussed and addressed.  No physical exam could be performed with this format.  Please refer to the patient's chart for her  consent to telehealth for [facility].   Date:  11/01/2019   ID:  [name], [name] [birth year], MRN [id number]  Patient Location: Home Provider Location: Office  PCP:  Jani Gravel, MD  Cardiologist:  Rozann Lesches, MD Electrophysiologist:  None   Evaluation Performed:  Follow-Up Visit  Chief Complaint:  Cardiac follow-up  History of Present Illness:    [name] is a 83 y.o. female last seen in February.  We spoke by phone today, I talked with her daughter by speaker phone as well.  She states that she has been staying around the house mostly during the pandemic.  She does not report any chest pain or palpitations.  Reports good appetite, NYHA class II dyspnea with typical activities.  Blood pressure was elevated today, this was taken before she ate or had any of her medications.  I asked him to check it again today.  She has a pending visit to see her PCP soon as well.  I reviewed her medications.  She continues on low-dose Eliquis without reported bleeding problems, recent lab work is reviewed below.  The patient does not have symptoms concerning for COVID-19 infection (fever, chills, cough, or new shortness of  breath).    Past Medical History:  Diagnosis Date  . Asthma   . Bronchitis   . Carotid artery disease ([place])   . Coronary atherosclerosis of native coronary artery    Nonobstructive at cath 2005  . DM2 (diabetes mellitus, type 2) ([place])   . Essential hypertension   . Gout   . History of stroke   . Hypothyroidism   . Influenza 2013  . Nonischemic cardiomyopathy ([facility])    LVEF 20-25%  . PAF (paroxysmal atrial fibrillation) ([place])    a. diagnosed in 04/2018. Started on Eliquis for anticoagulation.   . Renal artery stenosis ([facility])    Bilateral   Past Surgical History:  Procedure Laterality Date  . ABDOMINAL HYSTERECTOMY    . CATARACT EXTRACTION W/PHACO Left 01/18/2019   Procedure: CATARACT EXTRACTION PHACO AND INTRAOCULAR LENS PLACEMENT ([place]);  Surgeon: Baruch Goldmann, MD;  Location: [facility];  Service: Ophthalmology;  Laterality: Left;  CDE: 11.99  . CATARACT EXTRACTION W/PHACO Right 02/26/2019   Procedure: CATARACT EXTRACTION PHACO AND INTRAOCULAR LENS PLACEMENT RIGHT EYE (CDE: 13.46);  Surgeon: Baruch Goldmann, MD;  Location: [facility];  Service: Ophthalmology;  Laterality: Right;     Current Meds  Medication Sig  . allopurinol (ZYLOPRIM) 100 MG tablet Take 100 mg by mouth daily.   Marland Kitchen apixaban (ELIQUIS) 2.5 MG TABS tablet Take 1 tablet (2.5 mg total) by mouth 2 (two) times daily.  Marland Kitchen atorvastatin (LIPITOR) 20 MG tablet Take 1 tablet (20 mg total) by mouth daily at 6 PM. (Patient taking differently:  Take 20 mg by mouth daily at 12 noon. )  . azithromycin (ZITHROMAX) 250 MG tablet Take 1 tablet (250 mg total) by mouth daily.  Marland Kitchen azithromycin (ZITHROMAX) 250 MG tablet Take 1 tablet (250 mg total) by mouth daily.  . carvedilol (COREG) 12.5 MG tablet TAKE 1 TABLET(12.5 MG) BY MOUTH TWICE DAILY WITH A MEAL (Patient taking differently: Take 12.5 mg by mouth 2 (two) times daily with a meal. )  . cholecalciferol (VITAMIN D) 25 MCG (1000 UT) tablet Take 1,000 Units by mouth 2 (two) times daily.  Marland Kitchen  donepezil (ARICEPT) 10 MG tablet Take 10 mg by mouth daily.  . fluticasone (FLONASE) 50 MCG/ACT nasal spray Place 1 spray into both nostrils daily as needed for allergies or rhinitis.   . furosemide (LASIX) 40 MG tablet Take 80 mg by mouth daily.  Marland Kitchen gabapentin (NEURONTIN) 100 MG capsule Take 100 mg by mouth 3 (three) times daily.   . hydrALAZINE (APRESOLINE) 50 MG tablet Take 1.5 tablets (75 mg total) by mouth every 8 (eight) hours.  . isosorbide mononitrate (IMDUR) 30 MG 24 hr tablet Take 1 tablet (30 mg total) by mouth daily.  . pioglitazone (ACTOS) 15 MG tablet Take 15 mg by mouth daily with breakfast.   . sitaGLIPtin (JANUVIA) 50 MG tablet Take 50 mg by mouth daily.     Allergies:   Yellow jacket venom [bee venom]   Social History   Tobacco Use  . Smoking status: Never Smoker  . Smokeless tobacco: Never Used  Substance Use Topics  . Alcohol use: No    Alcohol/week: 0.0 standard drinks  . Drug use: No     Family Hx: The patient's family history includes Asthma in her son.  ROS:   Please see the history of present illness. All other systems reviewed and are negative.   Prior CV studies:   The following studies were reviewed today:  Echocardiogram 04/25/2018: Study Conclusions  - Left ventricle: The cavity size was normal. Wall thickness was increased in a pattern of moderate LVH. Systolic function was severely reduced. The estimated ejection fraction was 30%. Diffuse hypokinesis, most prominent basal inferolateral wall. Features are consistent with a pseudonormal left ventricular filling pattern, with concomitant abnormal relaxation and increased filling pressure (grade 2 diastolic dysfunction). - Aortic valve: Mildly calcified annulus. Trileaflet. - Mitral valve: Mildly thickened leaflets. There was moderate regurgitation. - Left atrium: The atrium was mildly dilated. - Right atrium: Central venous pressure (est): 3 mm Hg. - Tricuspid valve: There was  trivial regurgitation. - Pulmonary arteries: PA peak pressure: 37 mm Hg (S). - Pericardium, extracardiac: There was no pericardial effusion.  Carotid Dopplers 05/15/2018: IMPRESSION: Less than 50% stenosis in the right and left internal carotid arteries.   Labs/Other Tests and Data Reviewed:    EKG:  An ECG dated 06/28/2019 was personally reviewed today and demonstrated:  Sinus rhythm with PACs and right bundle branch block.  Recent Labs: 06/27/2019: B Natriuretic Peptide 79.0 10/10/2019: BUN 20; Creatinine, Ser 1.70; Hemoglobin 11.9; Platelets 238; Potassium 4.2; Sodium 144   Recent Lipid Panel Lab Results  Component Value Date/Time   CHOL 183 04/25/2018 03:10 AM   TRIG 69 04/25/2018 03:10 AM   HDL 52 04/25/2018 03:10 AM   CHOLHDL 3.5 04/25/2018 03:10 AM   LDLCALC 117 (H) 04/25/2018 03:10 AM    Wt Readings from Last 3 Encounters:  11/01/19 182 lb (82.6 kg)  06/27/19 186 lb (84.4 kg)  02/07/19 187 lb (84.8 kg)  Objective:    Vital Signs:  BP (!) 196/96   Pulse 71   Ht 5\' 4"  (1.626 m)   Wt 182 lb (82.6 kg)   BMI 31.24 kg/m    Patient spoke in short sentences, not short of breath. No audible wheezing or coughing. Speech pattern normal.  ASSESSMENT & PLAN:    1.  Nonischemic cardiomyopathy with LVEF approximately 30%.  Would continue conservative management, she has been clinically stable without weight gain or progressive leg swelling.  Stable NYHA class II dyspnea.  Continue Coreg, hydralazine, Imdur, and Lasix.  We have not pursued ARB/ANRI with renal insufficiency.  2.  Paroxysmal atrial fibrillation.  She reports no active palpitations.  Continue Eliquis for stroke prophylaxis, current dose is correct.  Lab work reviewed.  3.  Essential hypertension, blood pressure elevated today prior to taking medications.  I asked them to check it again and keep follow-up with PCP.    COVID-19 Education: The signs and symptoms of COVID-19 were discussed with the patient  and how to seek care for testing (follow up with PCP or arrange E-visit).  The importance of social distancing was discussed today.  Time:   Today, I have spent 6 minutes with the patient with telehealth technology discussing the above problems.     Medication Adjustments/Labs and Tests Ordered: Current medicines are reviewed at length with the patient today.  Concerns regarding medicines are outlined above.   Tests Ordered: No orders of the defined types were placed in this encounter.   Medication Changes: No orders of the defined types were placed in this encounter.   Follow Up:  In Person 6 months in the [place] office.  Signed, Rozann Lesches, MD  11/01/2019 11:25 AM    [place]

## 2019-11-01 ENCOUNTER — Encounter: Payer: Self-pay | Admitting: Cardiology

## 2019-11-01 ENCOUNTER — Telehealth (INDEPENDENT_AMBULATORY_CARE_PROVIDER_SITE_OTHER): Payer: Medicare Other | Admitting: Cardiology

## 2019-11-01 VITALS — BP 196/96 | HR 71 | Ht 64.0 in | Wt 182.0 lb

## 2019-11-01 DIAGNOSIS — I10 Essential (primary) hypertension: Secondary | ICD-10-CM

## 2019-11-01 DIAGNOSIS — I480 Paroxysmal atrial fibrillation: Secondary | ICD-10-CM

## 2019-11-01 DIAGNOSIS — N1832 Chronic kidney disease, stage 3b: Secondary | ICD-10-CM

## 2019-11-01 DIAGNOSIS — I428 Other cardiomyopathies: Secondary | ICD-10-CM

## 2019-11-01 NOTE — Patient Instructions (Signed)
Medication Instructions:  Your physician recommends that you continue on your current medications as directed. Please refer to the Current Medication list given to you today.  *If you need a refill on your cardiac medications before your next appointment, please call your pharmacy*  Lab Work: None today If you have labs (blood work) drawn today and your tests are completely normal, you will receive your results only by: . MyChart Message (if you have MyChart) OR . A paper copy in the mail If you have any lab test that is abnormal or we need to change your treatment, we will call you to review the results.  Testing/Procedures: None today  Follow-Up: At [facility], you and your health needs are our priority.  As part of our continuing mission to provide you with exceptional heart care, we have created designated Provider Care Teams.  These Care Teams include your primary Cardiologist (physician) and Advanced Practice Providers (APPs -  Physician Assistants and Nurse Practitioners) who all work together to provide you with the care you need, when you need it.  Your next appointment:   6 months  The format for your next appointment:   In Person  Provider:   Samuel McDowell, MD  Other Instructions None     Thank you for choosing [facility] !         

## 2019-12-26 ENCOUNTER — Emergency Department (HOSPITAL_COMMUNITY): Payer: Medicare Other

## 2019-12-26 ENCOUNTER — Other Ambulatory Visit: Payer: Self-pay

## 2019-12-26 ENCOUNTER — Encounter (HOSPITAL_COMMUNITY): Payer: Self-pay

## 2019-12-26 ENCOUNTER — Inpatient Hospital Stay (HOSPITAL_COMMUNITY)
Admission: EM | Admit: 2019-12-26 | Discharge: 2020-01-01 | DRG: 177 | Disposition: A | Discharge status: Home Health | Payer: Medicare Other | Attending: Internal Medicine | Admitting: Internal Medicine

## 2019-12-26 DIAGNOSIS — N1832 Chronic kidney disease, stage 3b: Secondary | ICD-10-CM | POA: Diagnosis present

## 2019-12-26 DIAGNOSIS — I5042 Chronic combined systolic (congestive) and diastolic (congestive) heart failure: Secondary | ICD-10-CM | POA: Diagnosis present

## 2019-12-26 DIAGNOSIS — I2510 Atherosclerotic heart disease of native coronary artery without angina pectoris: Secondary | ICD-10-CM | POA: Diagnosis present

## 2019-12-26 DIAGNOSIS — Z8673 Personal history of transient ischemic attack (TIA), and cerebral infarction without residual deficits: Secondary | ICD-10-CM

## 2019-12-26 DIAGNOSIS — R9431 Abnormal electrocardiogram [ECG] [EKG]: Secondary | ICD-10-CM | POA: Diagnosis present

## 2019-12-26 DIAGNOSIS — Z825 Family history of asthma and other chronic lower respiratory diseases: Secondary | ICD-10-CM

## 2019-12-26 DIAGNOSIS — J690 Pneumonitis due to inhalation of food and vomit: Secondary | ICD-10-CM | POA: Diagnosis present

## 2019-12-26 DIAGNOSIS — I130 Hypertensive heart and chronic kidney disease with heart failure and stage 1 through stage 4 chronic kidney disease, or unspecified chronic kidney disease: Secondary | ICD-10-CM | POA: Diagnosis present

## 2019-12-26 DIAGNOSIS — Z7984 Long term (current) use of oral hypoglycemic drugs: Secondary | ICD-10-CM | POA: Diagnosis not present

## 2019-12-26 DIAGNOSIS — I5043 Acute on chronic combined systolic (congestive) and diastolic (congestive) heart failure: Secondary | ICD-10-CM | POA: Diagnosis not present

## 2019-12-26 DIAGNOSIS — S0081XA Abrasion of other part of head, initial encounter: Secondary | ICD-10-CM | POA: Diagnosis present

## 2019-12-26 DIAGNOSIS — Z20822 Contact with and (suspected) exposure to covid-19: Secondary | ICD-10-CM | POA: Diagnosis present

## 2019-12-26 DIAGNOSIS — E876 Hypokalemia: Secondary | ICD-10-CM | POA: Diagnosis present

## 2019-12-26 DIAGNOSIS — N390 Urinary tract infection, site not specified: Secondary | ICD-10-CM | POA: Diagnosis present

## 2019-12-26 DIAGNOSIS — R29810 Facial weakness: Secondary | ICD-10-CM | POA: Diagnosis present

## 2019-12-26 DIAGNOSIS — B9620 Unspecified Escherichia coli [E. coli] as the cause of diseases classified elsewhere: Secondary | ICD-10-CM | POA: Diagnosis present

## 2019-12-26 DIAGNOSIS — I428 Other cardiomyopathies: Secondary | ICD-10-CM | POA: Diagnosis present

## 2019-12-26 DIAGNOSIS — G9341 Metabolic encephalopathy: Secondary | ICD-10-CM | POA: Diagnosis present

## 2019-12-26 DIAGNOSIS — S0990XA Unspecified injury of head, initial encounter: Secondary | ICD-10-CM

## 2019-12-26 DIAGNOSIS — G9340 Encephalopathy, unspecified: Secondary | ICD-10-CM

## 2019-12-26 DIAGNOSIS — R4182 Altered mental status, unspecified: Secondary | ICD-10-CM

## 2019-12-26 DIAGNOSIS — Z79899 Other long term (current) drug therapy: Secondary | ICD-10-CM | POA: Diagnosis not present

## 2019-12-26 DIAGNOSIS — M109 Gout, unspecified: Secondary | ICD-10-CM | POA: Diagnosis present

## 2019-12-26 DIAGNOSIS — W19XXXA Unspecified fall, initial encounter: Secondary | ICD-10-CM | POA: Diagnosis present

## 2019-12-26 DIAGNOSIS — Y92007 Garden or yard of unspecified non-institutional (private) residence as the place of occurrence of the external cause: Secondary | ICD-10-CM | POA: Diagnosis not present

## 2019-12-26 DIAGNOSIS — N179 Acute kidney failure, unspecified: Secondary | ICD-10-CM | POA: Diagnosis present

## 2019-12-26 DIAGNOSIS — Z7901 Long term (current) use of anticoagulants: Secondary | ICD-10-CM

## 2019-12-26 DIAGNOSIS — E1122 Type 2 diabetes mellitus with diabetic chronic kidney disease: Secondary | ICD-10-CM | POA: Diagnosis present

## 2019-12-26 DIAGNOSIS — Z8701 Personal history of pneumonia (recurrent): Secondary | ICD-10-CM

## 2019-12-26 DIAGNOSIS — J45909 Unspecified asthma, uncomplicated: Secondary | ICD-10-CM | POA: Diagnosis present

## 2019-12-26 DIAGNOSIS — Z95828 Presence of other vascular implants and grafts: Secondary | ICD-10-CM

## 2019-12-26 DIAGNOSIS — E039 Hypothyroidism, unspecified: Secondary | ICD-10-CM | POA: Diagnosis present

## 2019-12-26 DIAGNOSIS — Z91030 Bee allergy status: Secondary | ICD-10-CM

## 2019-12-26 DIAGNOSIS — I480 Paroxysmal atrial fibrillation: Secondary | ICD-10-CM | POA: Diagnosis present

## 2019-12-26 LAB — CBC
HCT: 41.7 % (ref 36.0–46.0)
Hemoglobin: 12.5 g/dL (ref 12.0–15.0)
MCH: 26.7 pg (ref 26.0–34.0)
MCHC: 30 g/dL (ref 30.0–36.0)
MCV: 88.9 fL (ref 80.0–100.0)
Platelets: 266 10*3/uL (ref 150–400)
RBC: 4.69 MIL/uL (ref 3.87–5.11)
RDW: 13.7 % (ref 11.5–15.5)
WBC: 6.5 10*3/uL (ref 4.0–10.5)
nRBC: 0 % (ref 0.0–0.2)

## 2019-12-26 LAB — COMPREHENSIVE METABOLIC PANEL
ALT: 8 U/L (ref 0–44)
AST: 16 U/L (ref 15–41)
Albumin: 3.7 g/dL (ref 3.5–5.0)
Alkaline Phosphatase: 98 U/L (ref 38–126)
Anion gap: 13 (ref 5–15)
BUN: 16 mg/dL (ref 8–23)
CO2: 22 mmol/L (ref 22–32)
Calcium: 9.3 mg/dL (ref 8.9–10.3)
Chloride: 106 mmol/L (ref 98–111)
Creatinine, Ser: 1.56 mg/dL — ABNORMAL HIGH (ref 0.44–1.00)
GFR calc Af Amer: 36 mL/min — ABNORMAL LOW (ref >60)
GFR calc non Af Amer: 31 mL/min — ABNORMAL LOW (ref >60)
Glucose, Bld: 218 mg/dL — ABNORMAL HIGH (ref 70–99)
Potassium: 3.5 mmol/L (ref 3.5–5.1)
Sodium: 141 mmol/L (ref 135–145)
Total Bilirubin: 0.4 mg/dL (ref 0.3–1.2)
Total Protein: 7.6 g/dL (ref 6.5–8.1)

## 2019-12-26 LAB — DIFFERENTIAL
Abs Immature Granulocytes: 0.02 10*3/uL (ref 0.00–0.07)
Basophils Absolute: 0 10*3/uL (ref 0.0–0.1)
Basophils Relative: 0 %
Eosinophils Absolute: 0.1 10*3/uL (ref 0.0–0.5)
Eosinophils Relative: 1 %
Immature Granulocytes: 0 %
Lymphocytes Relative: 47 %
Lymphs Abs: 3 10*3/uL (ref 0.7–4.0)
Monocytes Absolute: 0.5 10*3/uL (ref 0.1–1.0)
Monocytes Relative: 8 %
Neutro Abs: 2.8 10*3/uL (ref 1.7–7.7)
Neutrophils Relative %: 44 %

## 2019-12-26 LAB — AMMONIA: Ammonia: 34 umol/L (ref 9–35)

## 2019-12-26 LAB — PROTIME-INR
INR: 1.3 — ABNORMAL HIGH (ref 0.8–1.2)
Prothrombin Time: 15.9 seconds — ABNORMAL HIGH (ref 11.4–15.2)

## 2019-12-26 LAB — URINALYSIS, ROUTINE W REFLEX MICROSCOPIC
Bilirubin Urine: NEGATIVE
Glucose, UA: NEGATIVE mg/dL
Hgb urine dipstick: NEGATIVE
Ketones, ur: NEGATIVE mg/dL
Nitrite: POSITIVE — AB
Protein, ur: 100 mg/dL — AB
Specific Gravity, Urine: 1.015 (ref 1.005–1.030)
pH: 5 (ref 5.0–8.0)

## 2019-12-26 LAB — RAPID URINE DRUG SCREEN, HOSP PERFORMED
Amphetamines: NOT DETECTED
Barbiturates: NOT DETECTED
Benzodiazepines: NOT DETECTED
Cocaine: NOT DETECTED
Opiates: NOT DETECTED
Tetrahydrocannabinol: NOT DETECTED

## 2019-12-26 LAB — TSH: TSH: 5.282 u[IU]/mL — ABNORMAL HIGH (ref 0.350–4.500)

## 2019-12-26 LAB — APTT: aPTT: 27 seconds (ref 24–36)

## 2019-12-26 LAB — RESPIRATORY PANEL BY RT PCR (FLU A&B, COVID)
Influenza A by PCR: NEGATIVE
Influenza B by PCR: NEGATIVE
SARS Coronavirus 2 by RT PCR: NEGATIVE

## 2019-12-26 LAB — GLUCOSE, CAPILLARY: Glucose-Capillary: 116 mg/dL — ABNORMAL HIGH (ref 70–99)

## 2019-12-26 LAB — ETHANOL: Alcohol, Ethyl (B): <10 mg/dL (ref <10)

## 2019-12-26 LAB — MAGNESIUM: Magnesium: 1.9 mg/dL (ref 1.7–2.4)

## 2019-12-26 MED ORDER — IPRATROPIUM-ALBUTEROL 0.5-2.5 (3) MG/3ML IN SOLN: 3.0000 mL | RESPIRATORY_TRACT | Status: DC | PRN

## 2019-12-26 MED ORDER — LORAZEPAM 2 MG/ML IJ SOLN: 1.0000 mg | Freq: Once | INTRAMUSCULAR | Status: AC

## 2019-12-26 MED ORDER — IOHEXOL 350 MG/ML SOLN
75.0000 mL | Freq: Once | INTRAVENOUS | Status: AC | PRN
  Administered 2019-12-26: 75 mL via INTRAVENOUS

## 2019-12-26 MED ORDER — LABETALOL HCL 5 MG/ML IV SOLN
10.0000 mg | INTRAVENOUS | Status: DC | PRN
  Filled 2019-12-26: qty 4

## 2019-12-26 MED ORDER — LORAZEPAM 2 MG/ML IJ SOLN
1.0000 mg | Freq: Once | INTRAMUSCULAR | Status: AC
  Administered 2019-12-26: 1 mg via INTRAVENOUS

## 2019-12-26 MED ORDER — CHLORHEXIDINE GLUCONATE 0.12 % MT SOLN
15.0000 mL | Freq: Two times a day (BID) | OROMUCOSAL | Status: DC
  Administered 2019-12-27 – 2020-01-01 (×8): 15 mL via OROMUCOSAL
  Filled 2019-12-26 (×8): qty 15

## 2019-12-26 MED ORDER — SODIUM CHLORIDE 0.9 % IV SOLN: INTRAVENOUS | Status: AC

## 2019-12-26 MED ORDER — SODIUM CHLORIDE 0.9 % IV SOLN
1.0000 g | Freq: Once | INTRAVENOUS | Status: AC
  Administered 2019-12-26: 1 g via INTRAVENOUS
  Filled 2019-12-26: qty 10

## 2019-12-26 MED ORDER — POLYETHYLENE GLYCOL 3350 17 G PO PACK: 17.0000 g | PACK | Freq: Every day | ORAL | Status: DC | PRN

## 2019-12-26 MED ORDER — POTASSIUM CHLORIDE 10 MEQ/50ML IV SOLN
10.0000 meq | INTRAVENOUS | Status: DC
  Filled 2019-12-26 (×3): qty 50

## 2019-12-26 MED ORDER — HEPARIN SODIUM (PORCINE) 5000 UNIT/ML IJ SOLN
5000.0000 [IU] | Freq: Three times a day (TID) | INTRAMUSCULAR | Status: DC
  Administered 2019-12-26 – 2019-12-28 (×5): 5000 [IU] via SUBCUTANEOUS
  Filled 2019-12-26 (×5): qty 1

## 2019-12-26 MED ORDER — INSULIN ASPART 100 UNIT/ML ~~LOC~~ SOLN
0.0000 [IU] | SUBCUTANEOUS | Status: DC
  Administered 2019-12-27 – 2019-12-29 (×2): 1 [IU] via SUBCUTANEOUS
  Administered 2019-12-29: 3 [IU] via SUBCUTANEOUS
  Administered 2019-12-30: 1 [IU] via SUBCUTANEOUS
  Administered 2019-12-30: 2 [IU] via SUBCUTANEOUS
  Administered 2019-12-30 (×2): 1 [IU] via SUBCUTANEOUS
  Administered 2019-12-31: 3 [IU] via SUBCUTANEOUS
  Administered 2019-12-31 – 2020-01-01 (×4): 1 [IU] via SUBCUTANEOUS

## 2019-12-26 MED ORDER — ACETAMINOPHEN 650 MG RE SUPP: 650.0000 mg | Freq: Four times a day (QID) | RECTAL | Status: DC | PRN

## 2019-12-26 MED ORDER — POTASSIUM CHLORIDE 10 MEQ/100ML IV SOLN
10.0000 meq | INTRAVENOUS | Status: AC
  Administered 2019-12-26 (×3): 10 meq via INTRAVENOUS
  Filled 2019-12-26 (×3): qty 100

## 2019-12-26 MED ORDER — LORAZEPAM 2 MG/ML IJ SOLN
2.0000 mg | Freq: Once | INTRAMUSCULAR | Status: AC
  Administered 2019-12-26: 2 mg via INTRAVENOUS
  Filled 2019-12-26: qty 1

## 2019-12-26 MED ORDER — SODIUM CHLORIDE 0.9 % IV SOLN
100.0000 mg | Freq: Two times a day (BID) | INTRAVENOUS | Status: DC
  Filled 2019-12-26 (×6): qty 100

## 2019-12-26 MED ORDER — ORAL CARE MOUTH RINSE: 15.0000 mL | Freq: Two times a day (BID) | OROMUCOSAL | Status: DC

## 2019-12-26 MED ORDER — LORAZEPAM 2 MG/ML IJ SOLN
1.0000 mg | INTRAMUSCULAR | Status: DC | PRN
  Administered 2019-12-27 – 2019-12-28 (×3): 1 mg via INTRAVENOUS
  Filled 2019-12-26 (×3): qty 1

## 2019-12-26 MED ORDER — ACETAMINOPHEN 325 MG PO TABS: 650.0000 mg | ORAL_TABLET | Freq: Four times a day (QID) | ORAL | Status: DC | PRN

## 2019-12-26 MED ORDER — PIPERACILLIN-TAZOBACTAM 3.375 G IVPB
3.3750 g | Freq: Three times a day (TID) | INTRAVENOUS | Status: DC
  Administered 2019-12-27 – 2019-12-30 (×10): 3.375 g via INTRAVENOUS
  Filled 2019-12-26 (×10): qty 50

## 2019-12-26 MED ORDER — LORAZEPAM 2 MG/ML IJ SOLN
INTRAMUSCULAR | Status: AC
  Administered 2019-12-26: 1 mg via INTRAMUSCULAR
  Filled 2019-12-26: qty 1

## 2019-12-26 MED ORDER — SODIUM CHLORIDE 0.9 % IV SOLN
500.0000 mg | INTRAVENOUS | Status: DC
  Filled 2019-12-26: qty 500

## 2019-12-26 NOTE — ED Triage Notes (Signed)
Code Stroke called at 1427. Pt to CT at 1429. Was able to get CT head. Pt was found lying down by neighbor.

## 2019-12-26 NOTE — H&P (Addendum)
History and Physical    [name] [id number] DOB: June 25, 1936 DOA: 12/26/2019  PCP: Jani Gravel, MD   Patient coming from: Home  I have personally briefly reviewed patient's old medical records in [place]  Chief Complaint: Altered mental status  HPI: [name] is a 84 y.o. female with medical history significant for atrial fibrillation, diabetes mellitus, hypertension, systolic and diastolic CHF, asthma, CKD 3, gout.  Patient was brought to the ED via EMS.  Patient's was found down, lying in her front yard.  Patient's neighbor called EMS.   At the time of my evaluation, patient's eyes are closed, she moans and groans when hypoxia, and moves her extremities, she is status post 4mg  Ativan.  She is not answering questions. History is obtained from chart review and EDP. Patient's daughter talked to her at about 74 AM, and patient's son talked to patient about 30 minutes prior to arrival.  Per ED provider notes there was also reports of right-sided facial droop.  ED Course: On arrival in the ED, patient was very agitated and confused requiring total of 4 mg of Ativan.  Blood pressure systolic [id number].  O2 sats greater than 92% on 2 L O2.  Temperature 98.4.  Tachypneic.  WBC 6.5.  UA with positive nitrite small leukocytes and many bacteria.  Stat head and cervical CT-negative for acute intracranial abnormality, multiple chronic infarcts.  CTA chest-motion artifacts.  No large central lobular filling defects.  Left lower lobe consolidation and groundglass opacity concerning for pneumonia or sequelae of aspiration. Telemetry neurology consulted in the ED-possible seizure versus metabolic encephalopathy.  MRI recommended. IV ceftriaxone and azithromycin given.  Hospitalist to admit for altered mental status, UTI and possible aspiration pneumonia  Review of Systems: Unable to ascertain due to altered mental status.  Past Medical History:  Diagnosis Date  . Asthma   .  Bronchitis   . Carotid artery disease ([place])   . Coronary atherosclerosis of native coronary artery    Nonobstructive at cath 2005  . DM2 (diabetes mellitus, type 2) ([place])   . Essential hypertension   . Gout   . History of stroke   . Hypothyroidism   . Influenza 2013  . Nonischemic cardiomyopathy (H[facility])    LVEF 20-25%  . PAF (paroxysmal atrial fibrillation) ([place])    a. diagnosed in 04/2018. Started on Eliquis for anticoagulation.   . Renal artery stenosis (H[facility])    Bilateral    Past Surgical History:  Procedure Laterality Date  . ABDOMINAL HYSTERECTOMY    . CATARACT EXTRACTION W/PHACO Left 01/18/2019   Procedure: CATARACT EXTRACTION PHACO AND INTRAOCULAR LENS PLACEMENT ([address]);  Surgeon: Baruch Goldmann, MD;  Location: [facility];  Service: Ophthalmology;  Laterality: Left;  CDE: 11.99  . CATARACT EXTRACTION W/PHACO Right 02/26/2019   Procedure: CATARACT EXTRACTION PHACO AND INTRAOCULAR LENS PLACEMENT RIGHT EYE (CDE: 13.46);  Surgeon: Baruch Goldmann, MD;  Location: [facility];  Service: Ophthalmology;  Laterality: Right;     reports that she has never smoked. She has never used smokeless tobacco. She reports that she does not drink alcohol or use drugs.  Allergies  Allergen Reactions  . [place] Venom [Bee Venom] Shortness Of Breath and Itching    Family History  Problem Relation Age of Onset  . Asthma Son     Prior to Admission medications   Medication Sig Start Date End Date Taking? Authorizing Provider  apixaban (ELIQUIS) 2.5 MG TABS tablet Take 1 tablet (2.5 mg total)  by mouth 2 (two) times daily. 02/07/19  Yes Satira Sark, MD  carvedilol (COREG) 12.5 MG tablet TAKE 1 TABLET(12.5 MG) BY MOUTH TWICE DAILY WITH A MEAL Patient taking differently: Take 12.5 mg by mouth 2 (two) times daily with a meal.  05/15/19  Yes Satira Sark, MD  cholecalciferol (VITAMIN D) 25 MCG (1000 UT) tablet Take 1,000 Units by mouth 2 (two) times daily.   Yes [provider]  donepezil  (ARICEPT) 10 MG tablet Take 10 mg by mouth daily.   Yes [provider]  gabapentin (NEURONTIN) 100 MG capsule Take 100 mg by mouth 3 (three) times daily.    Yes [provider]  hydrALAZINE (APRESOLINE) 50 MG tablet Take 1.5 tablets (75 mg total) by mouth every 8 (eight) hours. 04/27/18  Yes Barton Dubois, MD  isosorbide mononitrate (IMDUR) 30 MG 24 hr tablet Take 1 tablet (30 mg total) by mouth daily. 09/16/19  Yes Satira Sark, MD  pioglitazone (ACTOS) 15 MG tablet Take 15 mg by mouth daily with breakfast.  08/12/17  Yes [provider]  sitaGLIPtin (JANUVIA) 50 MG tablet Take 50 mg by mouth daily.   Yes [provider]  allopurinol (ZYLOPRIM) 100 MG tablet Take 100 mg by mouth daily.  07/01/17   [provider]  atorvastatin (LIPITOR) 20 MG tablet Take 1 tablet (20 mg total) by mouth daily at 6 PM. Patient taking differently: Take 20 mg by mouth daily at 12 noon.  07/09/18   Satira Sark, MD  azithromycin (ZITHROMAX) 250 MG tablet Take 1 tablet (250 mg total) by mouth daily. 06/27/19   Veryl Speak, MD  azithromycin (ZITHROMAX) 250 MG tablet Take 1 tablet (250 mg total) by mouth daily. 06/27/19   Veryl Speak, MD  fluticasone (FLONASE) 50 MCG/ACT nasal spray Place 1 spray into both nostrils daily as needed for allergies or rhinitis.  01/03/19   [provider]  furosemide (LASIX) 40 MG tablet Take 80 mg by mouth daily.    [provider]    Physical Exam: Exam limited by mental status Vitals:   12/26/19 1534 12/26/19 1600 12/26/19 1700 12/26/19 1821  BP:  (!) 151/117 (!) 178/125 (!) 187/102  Pulse:  88 80 79  Resp:  (!) 29 (!) 29 (!) 29  Temp:      TempSrc:      SpO2:  99% 100% 92%  Weight: 82.6 kg       Constitutional: Moaning and groaning Vitals:   12/26/19 1534 12/26/19 1600 12/26/19 1700 12/26/19 1821  BP:  (!) 151/117 (!) 178/125 (!) 187/102  Pulse:  88 80 79  Resp:  (!) 29 (!) 29 (!) 29  Temp:        TempSrc:      SpO2:  99% 100% 92%  Weight: 82.6 kg      Eyes: Tightly closes her eyes on exam, but PERRL, lids and conjunctivae appear normal ENMT: Unable to examine Neck: normal, supple, no masses, no thyromegaly Respiratory: mild Audible gurgling in airway, normal respiratory effort. No accessory muscle use.  Cardiovascular: Regular rate and rhythm,  No extremity edema. 2+ pedal pulses.  Abdomen: no tenderness, no masses palpated. No hepatosplenomegaly. Bowel sounds positive.  Musculoskeletal: no clubbing / cyanosis. No joint deformity upper and lower extremities. Good ROM, no contractures. Normal muscle tone.  Skin: 2 abrasions from fall to right side of face around temporal area, no ulcers. No induration Neurologic: Moving all extremities.  No obvious facial  droop. Psychiatric: Unable to ascertain due to altered mental status.  Labs on Admission: I have personally reviewed following labs and imaging studies  CBC: Recent Labs  Lab 12/26/19 1440  WBC 6.5  NEUTROABS 2.8  HGB 12.5  HCT 41.7  MCV 88.9  PLT 123456   Basic Metabolic Panel: Recent Labs  Lab 12/26/19 1440  NA 141  K 3.5  CL 106  CO2 22  GLUCOSE 218*  BUN 16  CREATININE 1.56*  CALCIUM 9.3   Liver Function Tests: Recent Labs  Lab 12/26/19 1440  AST 16  ALT 8  ALKPHOS 98  BILITOT 0.4  PROT 7.6  ALBUMIN 3.7    Recent Labs  Lab 12/26/19 1642  AMMONIA 34   Coagulation Profile: Recent Labs  Lab 12/26/19 1440  INR 1.3*   Urine analysis:    Component Value Date/Time   COLORURINE YELLOW 12/26/2019 1616   APPEARANCEUR CLOUDY (A) 12/26/2019 1616   LABSPEC 1.015 12/26/2019 1616   PHURINE 5.0 12/26/2019 1616   GLUCOSEU NEGATIVE 12/26/2019 1616   HGBUR NEGATIVE 12/26/2019 1616   BILIRUBINUR NEGATIVE 12/26/2019 1616   KETONESUR NEGATIVE 12/26/2019 1616   PROTEINUR 100 (A) 12/26/2019 1616   UROBILINOGEN 0.2 07/09/2014 0553   NITRITE POSITIVE (A) 12/26/2019 1616   LEUKOCYTESUR SMALL (A)  12/26/2019 1616    Radiological Exams on Admission: CT ANGIO CHEST PE W OR WO CONTRAST  Result Date: 12/26/2019 CLINICAL DATA:  Found down, abnormal chest radiograph, combative patient EXAM: CT ANGIOGRAPHY CHEST WITH CONTRAST TECHNIQUE: Multidetector CT imaging of the chest was performed using the standard protocol during bolus administration of intravenous contrast. Multiplanar CT image reconstructions and MIPs were obtained to evaluate the vascular anatomy. CONTRAST:  78mL OMNIPAQUE IOHEXOL 350 MG/ML SOLN COMPARISON:  CT 04/24/2018, chest radiograph 12/26/2019 FINDINGS: Cardiovascular: Satisfactory opacification of the pulmonary arteries however evaluation is significantly limited due to extensive patient motion respiratory motion artifacts. No large central or lobar filling defects are identified. More distal evaluation is limited due to motion. Borderline cardiomegaly. Atherosclerotic calcifications of the coronary arteries are noted. Normal caliber thoracic aorta with extensive atherosclerotic plaque both calcified and noncalcified including some peripherally marginated plaque in the descending thoracic aorta. Normal 3 vessel branching of the aortic arch. Calcification of the proximal great vessels is noted as well. Mediastinum/Nodes: No mediastinal fluid or gas. Thyroid gland and thoracic inlet are unremarkable. No acute abnormality of the trachea or esophagus. Small amount of fluid seen in the distal thoracic esophagus, could reflect mild reflux. Lungs/Pleura: Evaluation of the lung parenchyma limited given respiratory motion artifact. Extensive atelectatic changes are noted posteriorly in the lungs. Corresponding well to the abnormality on radiography is a mixed consolidative and ground-glass opacity in the left lung base. Upper Abdomen: No acute abnormalities present in the visualized portions of the upper abdomen. Musculoskeletal: Multilevel degenerative changes are present in the imaged portions of  the spine. Additional degenerative changes throughout the shoulders. No acute osseous abnormality or suspicious osseous lesion. Evaluation for subtle fractures limited given motion. Review of the MIP images confirms the above findings. IMPRESSION: 1. Study is significantly limited due to extensive patient motion and respiratory motion artifacts. 2. No large central or lobar filling defects are identified. More distal evaluation is precluded due to artifact. 3. Left lower lobe mixed consolidative and ground-glass opacity, concerning for pneumonia or sequela of aspiration. Recommend follow-up imaging after the resolution of the acute symptoms to document resolution. 4. Small amount of fluid in the distal thoracic esophagus, could  reflect mild reflux. 5. Coronary artery atherosclerosis.  Borderline cardiomegaly. 6. Aortic Atherosclerosis (ICD10-I70.0). Electronically Signed   By: Lovena Le M.D.   On: 12/26/2019 16:50   CT CERVICAL SPINE WO CONTRAST  Result Date: 12/26/2019 CLINICAL DATA:  Neuro deficit, acute, stroke suspected. Additional history provided: Patient found face down on ground at [place] prior to arrival. EXAM: CT HEAD WITHOUT CONTRAST CT CERVICAL SPINE WITHOUT CONTRAST TECHNIQUE: Multidetector CT imaging of the head and cervical spine was performed following the standard protocol without intravenous contrast. Multiplanar CT image reconstructions of the cervical spine were also generated. COMPARISON:  Head CT 05/14/2018, brain MRI 05/15/2018. FINDINGS: CT HEAD FINDINGS Brain: There is no evidence of acute intracranial hemorrhage. Redemonstrated chronic left occipital lobe cortically based infarct. No new demarcated cortical infarction is identified. Redemonstrated chronic lacunar infarct within the deep right frontal lobe white matter. Additional mild ill-defined hypoattenuation within the cerebral white matter is nonspecific, but consistent with chronic small vessel ischemic disease. Mild  generalized parenchymal atrophy. Redemonstrated chronic infarcts within the left greater than right cerebellar hemispheres. No evidence of intracranial mass. No midline shift or extra-axial fluid collection. Vascular: No hyperdense vessel.  Atherosclerotic calcifications. Skull: Normal. Negative for fracture or focal lesion. Unchanged multifocal calvarial thickening. Sinuses/Orbits: Visualized orbits demonstrate no acute abnormality. No significant paranasal sinus disease or mastoid effusion. These results were called by telephone at the time of interpretation on 12/26/2019 at 4:47 pm to provider Dr. Sabra Heck, who verbally acknowledged these results. CT CERVICAL SPINE FINDINGS Alignment: Mild leftward rotation of C1 relative to C2 may be related to patient head position at the time of examination. A cervical dextrocurvature may also be positional. Reversal of the expected cervical lordosis. No significant spondylolisthesis. Skull base and vertebrae: The basion-dental and atlanto-dental intervals are maintained.No evidence of acute fracture to the cervical spine. Soft tissues and spinal canal: No prevertebral fluid or swelling. No visible canal hematoma. Disc levels: Cervical spondylosis with multilevel disc height loss, posterior disc osteophytes, uncovertebral and facet hypertrophy. There is also multilevel ossification of the posterior longitudinal ligament. Prominent ventrolateral osteophyte at C6-C7. Upper chest: No consolidation within the imaged lung apices. Calcified atherosclerotic plaque within the visualized aortic arch and proximal major branch vessels of the neck. IMPRESSION: CT head: 1. No evidence of acute intracranial abnormality. 2. Redemonstration of multiple chronic infarcts as detailed. 3. Background mild generalized parenchymal atrophy and chronic small vessel ischemic disease. CT cervical spine: 1. No evidence of acute fracture to the cervical spine. 2. Cervical spondylosis with multilevel  ossification of the posterior longitudinal ligament. Electronically Signed   By: Kellie Simmering DO   On: 12/26/2019 16:54   DG Chest Portable 1 View  Result Date: 12/26/2019 CLINICAL DATA:  Altered mental status EXAM: PORTABLE CHEST 1 VIEW COMPARISON:  06/27/2019, 01/22/2019 FINDINGS: Low lung volumes. Mild cardiomegaly with aortic atherosclerosis. Mild opacity at the cardiac apex. No pneumothorax. IMPRESSION: Low lung volumes with mild cardiomegaly. Opacity at the left cardiac apex, could be secondary to prominent epicardial fat though atelectasis or small focus airspace disease/pneumonia could also be considered. Electronically Signed   By: Donavan Foil M.D.   On: 12/26/2019 15:32   CT HEAD CODE STROKE WO CONTRAST  Result Date: 12/26/2019 CLINICAL DATA:  Neuro deficit, acute, stroke suspected. Additional history provided: Patient found face down on ground at [place] prior to arrival. EXAM: CT HEAD WITHOUT CONTRAST CT CERVICAL SPINE WITHOUT CONTRAST TECHNIQUE: Multidetector CT imaging of the head and cervical spine was performed following the  standard protocol without intravenous contrast. Multiplanar CT image reconstructions of the cervical spine were also generated. COMPARISON:  Head CT 05/14/2018, brain MRI 05/15/2018. FINDINGS: CT HEAD FINDINGS Brain: There is no evidence of acute intracranial hemorrhage. Redemonstrated chronic left occipital lobe cortically based infarct. No new demarcated cortical infarction is identified. Redemonstrated chronic lacunar infarct within the deep right frontal lobe white matter. Additional mild ill-defined hypoattenuation within the cerebral white matter is nonspecific, but consistent with chronic small vessel ischemic disease. Mild generalized parenchymal atrophy. Redemonstrated chronic infarcts within the left greater than right cerebellar hemispheres. No evidence of intracranial mass. No midline shift or extra-axial fluid collection. Vascular: No hyperdense vessel.   Atherosclerotic calcifications. Skull: Normal. Negative for fracture or focal lesion. Unchanged multifocal calvarial thickening. Sinuses/Orbits: Visualized orbits demonstrate no acute abnormality. No significant paranasal sinus disease or mastoid effusion. These results were called by telephone at the time of interpretation on 12/26/2019 at 4:47 pm to provider Dr. Sabra Heck, who verbally acknowledged these results. CT CERVICAL SPINE FINDINGS Alignment: Mild leftward rotation of C1 relative to C2 may be related to patient head position at the time of examination. A cervical dextrocurvature may also be positional. Reversal of the expected cervical lordosis. No significant spondylolisthesis. Skull base and vertebrae: The basion-dental and atlanto-dental intervals are maintained.No evidence of acute fracture to the cervical spine. Soft tissues and spinal canal: No prevertebral fluid or swelling. No visible canal hematoma. Disc levels: Cervical spondylosis with multilevel disc height loss, posterior disc osteophytes, uncovertebral and facet hypertrophy. There is also multilevel ossification of the posterior longitudinal ligament. Prominent ventrolateral osteophyte at C6-C7. Upper chest: No consolidation within the imaged lung apices. Calcified atherosclerotic plaque within the visualized aortic arch and proximal major branch vessels of the neck. IMPRESSION: CT head: 1. No evidence of acute intracranial abnormality. 2. Redemonstration of multiple chronic infarcts as detailed. 3. Background mild generalized parenchymal atrophy and chronic small vessel ischemic disease. CT cervical spine: 1. No evidence of acute fracture to the cervical spine. 2. Cervical spondylosis with multilevel ossification of the posterior longitudinal ligament. Electronically Signed   By: Kellie Simmering DO   On: 12/26/2019 16:54    EKG: Independently reviewed.  Sinus rhythm, QTC prolonged at 676.  Mild nonspecific ST and T wave abnormalities in 1 and  aVL.  Assessment/Plan Active Problems:   Acute metabolic encephalopathy    Acute metabolic encephalopathy- differentials include infectious etiology UTI and pneumonia vs stroke versus seizure.  No known history of seizures.  Marked agitation on arrival.  Reported facial droop not evident in the ED.  CT shows multiple chronic infarcts. -Telemetry neurology consulted in the ED -MRI brain -N.p.o. -IV antibiotics -Gentle fluids while n.p.o. N/s +40KCL 50cc/hr x 15 hrs -EEG -In-house neurology consult for follow-up  Pneumonia- WBC 6.5.  Tachypneic.  Rules out for sepsis.  CTA chest negative for large vessel filling defect.  Left lower lobe mixed consolidative and groundglass opacity concerning for pneumonia or sequelae of aspiration.  Respiratory panel negative for COVID-19 and influenza. -IV ceftriaxone and azithromycin given in the ED, will continue IV Zosyn and IV doxycycline. (To appropriately cover an aspiration pneumonia, and old urine cultures showing E. coli resistant to ampicillin hence not using Unasyn) -Aspiration precautions -Swallow evaluation -BMP, CBC a.m. -Supplemental O2, PRN albuterol  Urinary tract infection-presented with altered mental status.  Rules out for sepsis.  WBC 6.5.  No new urine cultures on file, old urine cultures show resistance to ampicillin. -IV Zosyn and IV azithromycin -CBC a.m. -  Follow-up urine cultures  Prolonged QTC-markedly prolonged at 676.  Potassium 3.5 -Replete potassium -Magnesium -Avoid QT prolonging medications  Diabetes mellitus-random glucose 218. - SSI - s q4h -Hold home Actos and Januvia while n.p.o.  Atrial fibrillation-rate controlled.  Currently in sinus rhythm/ectopic atrial rhythm. -Hold p.o. Cardizem while n.p.o. -IV labetalol 10 mg for heart rate greater than 120 -Continue home Eliquis  HTN-elevated -IV labetalol 10 mg for systolic greater than [id number] -Hold home Coreg, Lasix 80 mg daily, hydralazine, Imdur while  n.p.o.  Systolic and diastolic CHF- appears euvolemic.  Last echo 04/2018 EF 30%, diffuse hypokinesis, G2DD.  -Hold home Lasix, Coreg, hydralazine, Imdur while n.p.o. for now -Gentle fluids  Asthma-stable. -DuoNebs as needed  Gout-stable. -Hold home allopurinol while n.p.o.   DVT prophylaxis: Heparin Code Status: Full code Family Communication: None at bedside Disposition Plan: Greater than 2 days Consults called: None Admission status: Inpatient, telemetry I certify that at the point of admission it is my clinical judgment that the patient will require inpatient hospital care spanning beyond 2 midnights from the point of admission due to high intensity of service, high risk for further deterioration and high frequency of surveillance required. The following factors support the patient status of inpatient: Altered mental status likely from pneumonia and UTI requiring IV antibiotics.   Bethena Roys MD [facility]  12/26/2019, 7:14 PM

## 2019-12-26 NOTE — ED Provider Notes (Signed)
[name] EMERGENCY DEPARTMENT Provider Note   CSN: [id number] Arrival date & time: 12/26/19  1425     History Chief Complaint  Patient presents with  . Code Stroke    [name] is a 84 y.o. female. Level 5 caveat due to altered mental status. HPI Patient came in as a code stroke called by EMS.  Met immediately upon arrival by me.  At 11 AM patient's daughter had talked to her.  Around half hour prior to arrival patient's son had talked to her and was normal, however the son does have some MR.  Patient was found laying in the front yard.  Abrasion to right side head.  Decreased fussiness.  Right-sided facial droop.  CBG was 200 something.  Does apparently have history of atrial fibrillation and is on Eliquis.  Patient cannot provide any history.  EMS had difficulty getting blood pressure but states he had a palpable pulse.    Past Medical History:  Diagnosis Date  . Asthma   . Bronchitis   . Carotid artery disease ([place])   . Coronary atherosclerosis of native coronary artery    Nonobstructive at cath 2005  . DM2 (diabetes mellitus, type 2) ([place])   . Essential hypertension   . Gout   . History of stroke   . Hypothyroidism   . Influenza 2013  . Nonischemic cardiomyopathy (HCC)    LVEF 20-25%  . PAF (paroxysmal atrial fibrillation) ([place])    a. diagnosed in 04/2018. Started on Eliquis for anticoagulation.   . Renal artery stenosis [facility])    Bilateral    Patient Active Problem List   Diagnosis Date Noted  . Syncope and collapse 05/17/2018  . Chronic combined systolic and diastolic CHF (congestive heart failure) ([place]) 05/16/2018  . Acute renal failure superimposed on stage 3 chronic kidney disease ([place]) 05/16/2018  . Atrial fibrillation, chronic ([place]) 05/14/2018  . Atrial fibrillation with RVR ([place]) 04/24/2018  . Hypokalemia 04/24/2018  . Acute exacerbation of CHF (congestive heart failure) ([place]) 09/01/2015  . CHF (congestive heart failure) ([place]) 07/02/2015  .  Elevated troponin I level 07/02/2015  . Right bundle branch block 07/02/2015  . Renal insufficiency 07/02/2015  . CAP (community acquired pneumonia) 04/25/2015  . Acute respiratory failure with hypoxia ([place]) 04/25/2015  . Acute renal injury ([place]) 04/25/2015  . Type 2 diabetes with nephropathy ([place]) 04/25/2015  . ARF (acute renal failure) ([place]) 06/18/2014  . Mitral regurgitation 06/09/2014  . Dyspnea 06/06/2014  . Asthma exacerbation 06/06/2014  . Acute on chronic combined systolic and diastolic CHF (congestive heart failure) ([place]) 06/06/2014  . Influenza 12/10/2012  . Type 2 diabetes mellitus with hyperlipidemia ([place]) 07/08/2010  . Essential hypertension 04/06/2010  . CORONARY ATHEROSCLEROSIS NATIVE CORONARY ARTERY 04/06/2010  . CARDIOMYOPATHY 03/29/2010  . CVA (cerebral vascular accident) ([place]) 03/29/2010    Past Surgical History:  Procedure Laterality Date  . ABDOMINAL HYSTERECTOMY    . CATARACT EXTRACTION W/PHACO Left 01/18/2019   Procedure: CATARACT EXTRACTION PHACO AND INTRAOCULAR LENS PLACEMENT ([place]);  Surgeon: Baruch Goldmann, MD;  Location: [facility] ORS;  Service: Ophthalmology;  Laterality: Left;  CDE: 11.99  . CATARACT EXTRACTION W/PHACO Right 02/26/2019   Procedure: CATARACT EXTRACTION PHACO AND INTRAOCULAR LENS PLACEMENT RIGHT EYE (CDE: 13.46);  Surgeon: Baruch Goldmann, MD;  Location: [facility];  Service: Ophthalmology;  Laterality: Right;     OB History    Gravida  15   Para  15   Term  15   Preterm  AB      Living        SAB      TAB      Ectopic      Multiple      Live Births              Family History  Problem Relation Age of Onset  . Asthma Son     Social History   Tobacco Use  . Smoking status: Never Smoker  . Smokeless tobacco: Never Used  Substance Use Topics  . Alcohol use: No    Alcohol/week: 0.0 standard drinks  . Drug use: No    Home Medications Prior to Admission medications   Medication Sig Start Date End Date Taking?  Authorizing Provider  allopurinol (ZYLOPRIM) 100 MG tablet Take 100 mg by mouth daily.  07/01/17   [provider]  apixaban (ELIQUIS) 2.5 MG TABS tablet Take 1 tablet (2.5 mg total) by mouth 2 (two) times daily. 02/07/19   Satira Sark, MD  atorvastatin (LIPITOR) 20 MG tablet Take 1 tablet (20 mg total) by mouth daily at 6 PM. Patient taking differently: Take 20 mg by mouth daily at 12 noon.  07/09/18   Satira Sark, MD  azithromycin (ZITHROMAX) 250 MG tablet Take 1 tablet (250 mg total) by mouth daily. 06/27/19   Veryl Speak, MD  azithromycin (ZITHROMAX) 250 MG tablet Take 1 tablet (250 mg total) by mouth daily. 06/27/19   Veryl Speak, MD  carvedilol (COREG) 12.5 MG tablet TAKE 1 TABLET(12.5 MG) BY MOUTH TWICE DAILY WITH A MEAL Patient taking differently: Take 12.5 mg by mouth 2 (two) times daily with a meal.  05/15/19   Satira Sark, MD  cholecalciferol (VITAMIN D) 25 MCG (1000 UT) tablet Take 1,000 Units by mouth 2 (two) times daily.    [provider]  donepezil (ARICEPT) 10 MG tablet Take 10 mg by mouth daily.    [provider]  fluticasone (FLONASE) 50 MCG/ACT nasal spray Place 1 spray into both nostrils daily as needed for allergies or rhinitis.  01/03/19   [provider]  furosemide (LASIX) 40 MG tablet Take 80 mg by mouth daily.    [provider]  gabapentin (NEURONTIN) 100 MG capsule Take 100 mg by mouth 3 (three) times daily.     [provider]  hydrALAZINE (APRESOLINE) 50 MG tablet Take 1.5 tablets (75 mg total) by mouth every 8 (eight) hours. 04/27/18   Barton Dubois, MD  isosorbide mononitrate (IMDUR) 30 MG 24 hr tablet Take 1 tablet (30 mg total) by mouth daily. 09/16/19   Satira Sark, MD  pioglitazone (ACTOS) 15 MG tablet Take 15 mg by mouth daily with breakfast.  08/12/17   [provider]  sitaGLIPtin (JANUVIA) 50 MG tablet Take 50 mg by mouth daily.    [provider]    Allergies      Yellow jacket venom [bee venom]  Review of Systems   Review of Systems  Unable to perform ROS: Mental status change    Physical Exam Updated Vital Signs There were no vitals taken for this visit.  Physical Exam Vitals and nursing note reviewed.  HENT:     Head:     Comments: Abrasion to right forehead. Neck:     Comments: No midline tenderness or deformity Cardiovascular:     Rate and Rhythm: Normal rate.  Pulmonary:     Breath sounds: No wheezing or rhonchi.  Abdominal:  Tenderness: There is no abdominal tenderness.  Musculoskeletal:        General: No tenderness.  Skin:    General: Skin is warm.  Neurological:     Comments: Sitting in stretcher.  Eyes held closed.  Pupils do appear reactive however difficult exam.  Patient does appear to have right-sided facial droop.  Nonverbal.  Will not follow commands however she is moving all extremities.     ED Results / Procedures / Treatments   Labs (all labs ordered are listed, but only abnormal results are displayed) Labs Reviewed  PROTIME-INR - Abnormal; Notable for the following components:      Result Value   Prothrombin Time 15.9 (*)    INR 1.3 (*)    All other components within normal limits  RESPIRATORY PANEL BY RT PCR (FLU A&B, COVID)  ETHANOL  APTT  CBC  DIFFERENTIAL  COMPREHENSIVE METABOLIC PANEL  RAPID URINE DRUG SCREEN, HOSP PERFORMED  URINALYSIS, ROUTINE W REFLEX MICROSCOPIC  I-STAT CHEM 8, ED    EKG None  Radiology No results found.  Procedures Procedures (including critical care time)  Medications Ordered in ED Medications  LORazepam (ATIVAN) injection 1 mg (has no administration in time range)  LORazepam (ATIVAN) 2 MG/ML injection (has no administration in time range)    ED Course  I have reviewed the triage vital signs and the nursing notes.  Pertinent labs & imaging results that were available during my care of the patient were reviewed by me and considered in my medical decision  making (see chart for details).    MDM Rules/Calculators/A&P                      Patient came in as a code stroke called by EMS.  Last normal either 11 AM or about 145.  However patient is on anticoagulation for atrial fibrillation.  Did have facial droop otherwise.  Moving all extremities but really cannot follow commands.  Initially attempted head CT but did not tolerate.  IM Ativan given at that time followed by IV Ativan.  Taken back to room and seen by teleneurology.  Vitals done at that time.  Not a TPA candidate due to anticoagulation.  Will turn over care to Dr. Sabra Heck.  CRITICAL CARE Performed by: Davonna Belling Total critical care time: 30 minutes Critical care time was exclusive of separately billable procedures and treating other patients. Critical care was necessary to treat or prevent imminent or life-threatening deterioration. Critical care was time spent personally by me on the following activities: development of treatment plan with patient and/or surrogate as well as nursing, discussions with consultants, evaluation of patient's response to treatment, examination of patient, obtaining history from patient or surrogate, ordering and performing treatments and interventions, ordering and review of laboratory studies, ordering and review of radiographic studies, pulse oximetry and re-evaluation of patient's condition.  Final Clinical Impression(s) / ED Diagnoses Final diagnoses:  Altered mental status, unspecified altered mental status type    Rx / DC Orders ED Discharge Orders    None       Davonna Belling, MD 12/26/19 1517

## 2019-12-26 NOTE — Progress Notes (Addendum)
Pharmacy Antibiotic Note  BRIONY MUCHOW is a 84 y.o. female admitted on 12/26/2019 with altered mental status and possible aspiration pneumonia. Pharmacy has been consulted for Zosyn dosing.  Plan:  Start Zosyn 3.375g IV q8h (4-hr infusion) Pharmacy will continue to monitor renal function, cultures and patient progress.  Weight: 182 lb 1.6 oz (82.6 kg)  Temp (24hrs), Avg:98 F (36.7 C), Min:97.5 F (36.4 C), Max:98.4 F (36.9 C)  Recent Labs  Lab 12/26/19 1440  WBC 6.5  CREATININE 1.56*    Estimated Creatinine Clearance: 29.4 mL/min (A) (by C-G formula based on SCr of 1.56 mg/dL (H)).    Allergies  Allergen Reactions  . Yellow Jacket Venom [Bee Venom] Shortness Of Breath and Itching    Antimicrobials this admission: Zosyn 1/14 >>   ceftriaxone 1/14>> x1 dose doxycycline 1/15>>    Microbiology results:  1/14 UCx:   1/14 Resp Panel: SARS CoV-2 negative, Flu A/B negative   Thank you for allowing pharmacy to be a part of this patient's care.  Despina Pole 12/26/2019 10:31 PM

## 2019-12-26 NOTE — Consult Note (Signed)
[facility] Tele[facility]urology Consult Services   Date of Service:   12/26/2019 15:04:41  Impression:     .  G93.49 - Encephalopathy Multifactorial  Comments/Sign-Out: p/w found down on ground face down unresponsive at approx 1 pm by neighboros, LKW 11 am; on arrival in ED pt is very agitated and confused; thrashing all 4 extremities; CT head NEG for any acute lesions; NOT tPA candidate b/c of eliquis [place]; ddx includes seizure, stroke, metabolic encephalopathy  Metrics: Last Known Well: 12/26/2019 11:00:00 [facility] Notification Time: 12/26/2019 15:04:41 Arrival Time: 12/26/2019 15:04:00 Stamp Time: 12/26/2019 15:04:41 Time First Login Attempt: 12/26/2019 15:08:07 Symptoms: AMS NIHSS Start Assessment Time: 12/26/2019 15:09:12 Patient is not a candidate for Alteplase/Activase. Patient was not deemed candidate for Alteplase/Activase thrombolytics because of Use of NOAs within 48 hours.  CT head showed no acute hemorrhage or acute core infarct.  Clinical Presentation is not Suggestive of Large Vessel Occlusive Disease  ED Physician notified of diagnostic impression and management plan on 12/26/2019 15:22:54  Our recommendations are outlined below.  Recommendations:     .  Activate Stroke Protocol Admission/Order Set     .  Stroke/Telemetry Floor     .  Neuro Checks     .  Bedside Swallow Eval     .  DVT Prophylaxis     .  IV Fluids, Normal Saline     .  Head of Bed 30 Degrees     .  Euglycemia and Avoid Hyperthermia (PRN Acetaminophen)     .  admit for seizure vs metabolic encephalopathy evaluation; IV fluids with NS only; permissive HTN; SBP goal <160 dvt prophy is ok; check cbc, bmp, tsh, lipids, hga1c, u/a  Routine Consultation with [facility] for Follow up Care  Sign Out:     .  Discussed with Emergency Department Provider    ------------------------------------------------------------------------------  History of Present  Illness: Patient is a 84 year old Female.  Patient was brought by EMS for symptoms of AMS  p/w found down on ground face down unresponsive at approx 1 pm by neighboros, LKW 11 am; on arrival in ED pt is very agitated and confused; thrashing all 4 extremities; CT head NEG for any acute lesions; NOT tPA candidate b/c of eliquis [place]; ddx includes seizure, stroke, metabolic encephalopathy           Examination: BP(SBP <160), Pulse(<120), Blood Glucose(<110) 1A: Level of Consciousness - Requires repeated stimulation to arouse + 2 1B: Ask Month and Age - Could Not Answer Either Question Correctly + 2 1C: Blink Eyes & Squeeze Hands - Performs 0 Tasks + 2 2: Test Horizontal Extraocular Movements - Normal + 0 3: Test Visual Fields - No Visual Loss + 0 4: Test Facial Palsy (Use Grimace if Obtunded) - Normal symmetry + 0 5A: Test Left Arm Motor Drift - No Drift for 10 Seconds + 0 5B: Test Right Arm Motor Drift - No Drift for 10 Seconds + 0 6A: Test Left Leg Motor Drift - No Drift for 5 Seconds + 0 6B: Test Right Leg Motor Drift - No Drift for 5 Seconds + 0 7: Test Limb Ataxia (FNF/Heel-Shin) - No Ataxia + 0 8: Test Sensation - Normal; No sensory loss + 0 9: Test Language/Aphasia - Severe Aphasia: Fragmentary Expression, Inference Needed, Cannot Identify Materials + 2 10: Test Dysarthria - Mild-Moderate Dysarthria: Slurring but can be understood + 1 11: Test Extinction/Inattention - No abnormality + 0  NIHSS Score: 9  Pre-Morbid Modified Ranking Scale:  0 Points = No symptoms at all  Patient/Family was informed the Neurology Consult would happen via TeleHealth consult by way of interactive audio and video telecommunications and consented to receiving care in this manner.   Due to the immediate potential for life-threatening deterioration due to underlying acute neurologic illness, I spent 30 minutes providing critical care. This time includes time for face to face visit via telemedicine,  review of medical records, imaging studies and discussion of findings with providers, the patient and/or family.   Dr Agustin Cree   [facility] [phone]  Case XL:[id number]

## 2019-12-26 NOTE — Progress Notes (Signed)
Patient unable to answer questions at this time due to mental status

## 2019-12-26 NOTE — ED Provider Notes (Signed)
This patient was accepted at change of shift, she is an 84 year old female, she does not have a history of seizure that we know of, was found in the yard facedown with an abrasion to the right forehead.  She was essentially obtunded not able to answer questions and still is not able to answer questions.  On my exam the patient is able to barely move both hands and both legs but seems to be very encephalopathic.  She is symmetrically weak, she does not have facial droop, she just moans when she is asked to answer questions but does not produce any understandable speech.  She is mildly tachycardic has a soft abdomen, no jaundice, moist mucous membranes without signs of tongue biting.  CT scan of the brain appears to be unremarkable for injury however CT scan of the cervical spine reveals that there may be some infiltrates in the upper lungs.  The patient will have a CT scan of the chest to further evaluate, she will likely need to be admitted and according to the teleneurologist he request that she be admitted for both an MRI and an EEG as it is unclear what is causing the encephalopathy.  The patient is not on any medicines that would cause abnormal drug levels.  Level 5 caveat applies secondary to altered mental status and the inability of the patient to give any information.  She has required multiple doses of Ativan for agitation and combativeness.  There have been multiple lab abnormalities confirmed including a urinary tract infection.  She also has what appears to be a lower lobe pneumonia possibly from aspiration.  She will need to be treated with antibiotics which have been ordered, she will be admitted to the hospital.  She continues to be encephalopathic  I discussed the care with Dr. Denton Brick at 5:15 PM, she will admit to the hospital, drug screen negative, Covid negative, urine culture added.  .Critical Care Performed by: Noemi Chapel, MD Authorized by: Noemi Chapel, MD   Critical care  provider statement:    Critical care time (minutes):  35   Critical care time was exclusive of:  Separately billable procedures and treating other patients and teaching time   Critical care was necessary to treat or prevent imminent or life-threatening deterioration of the following conditions:  CNS failure or compromise   Critical care was time spent personally by me on the following activities:  Blood draw for specimens, development of treatment plan with patient or surrogate, discussions with consultants, evaluation of patient's response to treatment, examination of patient, obtaining history from patient or surrogate, ordering and performing treatments and interventions, ordering and review of laboratory studies, ordering and review of radiographic studies, pulse oximetry, re-evaluation of patient's condition and review of old charts    Final diagnoses:  Altered mental status, unspecified altered mental status type  Aspiration pneumonia of lower lobe, unspecified aspiration pneumonia type, unspecified laterality ([place])  Urinary tract infection without hematuria, site unspecified  Acute encephalopathy  Minor head injury, initial encounter        Noemi Chapel, MD 12/26/19 1717

## 2019-12-26 NOTE — ED Notes (Signed)
[place]

## 2019-12-27 ENCOUNTER — Other Ambulatory Visit: Payer: Self-pay

## 2019-12-27 ENCOUNTER — Inpatient Hospital Stay (HOSPITAL_COMMUNITY): Payer: Medicare Other

## 2019-12-27 ENCOUNTER — Inpatient Hospital Stay (HOSPITAL_COMMUNITY)
Admit: 2019-12-27 | Discharge: 2019-12-27 | Disposition: A | Discharge status: Home / Self Care | Payer: Medicare Other | Attending: Internal Medicine | Admitting: Internal Medicine

## 2019-12-27 DIAGNOSIS — G9340 Encephalopathy, unspecified: Secondary | ICD-10-CM

## 2019-12-27 LAB — BASIC METABOLIC PANEL
Anion gap: 14 (ref 5–15)
BUN: 12 mg/dL (ref 8–23)
CO2: 19 mmol/L — ABNORMAL LOW (ref 22–32)
Calcium: 9.2 mg/dL (ref 8.9–10.3)
Chloride: 110 mmol/L (ref 98–111)
Creatinine, Ser: 1.26 mg/dL — ABNORMAL HIGH (ref 0.44–1.00)
GFR calc Af Amer: 46 mL/min — ABNORMAL LOW (ref >60)
GFR calc non Af Amer: 40 mL/min — ABNORMAL LOW (ref >60)
Glucose, Bld: 86 mg/dL (ref 70–99)
Potassium: 4.4 mmol/L (ref 3.5–5.1)
Sodium: 143 mmol/L (ref 135–145)

## 2019-12-27 LAB — LIPID PANEL
Cholesterol: 194 mg/dL (ref 0–200)
HDL: 39 mg/dL — ABNORMAL LOW (ref >40)
LDL Cholesterol: 126 mg/dL — ABNORMAL HIGH (ref 0–99)
Total CHOL/HDL Ratio: 5 RATIO
Triglycerides: 143 mg/dL (ref <150)
VLDL: 29 mg/dL (ref 0–40)

## 2019-12-27 LAB — GLUCOSE, CAPILLARY
Glucose-Capillary: 105 mg/dL — ABNORMAL HIGH (ref 70–99)
Glucose-Capillary: 105 mg/dL — ABNORMAL HIGH (ref 70–99)
Glucose-Capillary: 116 mg/dL — ABNORMAL HIGH (ref 70–99)
Glucose-Capillary: 117 mg/dL — ABNORMAL HIGH (ref 70–99)
Glucose-Capillary: 138 mg/dL — ABNORMAL HIGH (ref 70–99)
Glucose-Capillary: 89 mg/dL (ref 70–99)
Glucose-Capillary: 99 mg/dL (ref 70–99)

## 2019-12-27 LAB — CBC
HCT: 40.3 % (ref 36.0–46.0)
Hemoglobin: 12 g/dL (ref 12.0–15.0)
MCH: 26.3 pg (ref 26.0–34.0)
MCHC: 29.8 g/dL — ABNORMAL LOW (ref 30.0–36.0)
MCV: 88.4 fL (ref 80.0–100.0)
Platelets: 250 10*3/uL (ref 150–400)
RBC: 4.56 MIL/uL (ref 3.87–5.11)
RDW: 13.5 % (ref 11.5–15.5)
WBC: 6.9 10*3/uL (ref 4.0–10.5)
nRBC: 0 % (ref 0.0–0.2)

## 2019-12-27 LAB — HEMOGLOBIN A1C
Hgb A1c MFr Bld: 6.6 % — ABNORMAL HIGH (ref 4.8–5.6)
Mean Plasma Glucose: 142.72 mg/dL

## 2019-12-27 LAB — PROLACTIN: Prolactin: 61.9 ng/mL — ABNORMAL HIGH (ref 4.8–23.3)

## 2019-12-27 MED ORDER — LACTATED RINGERS IV SOLN: INTRAVENOUS | Status: DC

## 2019-12-27 MED ORDER — LEVETIRACETAM IN NACL 500 MG/100ML IV SOLN
500.0000 mg | Freq: Two times a day (BID) | INTRAVENOUS | Status: DC
  Administered 2019-12-27 – 2019-12-30 (×7): 500 mg via INTRAVENOUS
  Filled 2019-12-27 (×7): qty 100

## 2019-12-27 NOTE — Progress Notes (Addendum)
Initial Nutrition Assessment  DOCUMENTATION CODES:   Obesity unspecified  INTERVENTION:  Patient remains NPO- ST evaluation pending.  RD will continue to follow for diet advancment   NUTRITION DIAGNOSIS:   Inadequate oral intake related to acute illness(AMS, concern for pneumonia) as evidenced by NPO status.   GOAL:   Patient will meet greater than or equal to 90% of their needs  MONITOR:   Diet advancement, PO intake, Skin, Weight trends, Labs  REASON FOR ASSESSMENT:   Malnutrition Screening Tool    ASSESSMENT: Patient is an 84 yo female with history of Stroke, CAD, DM-2 and HTN. Presents after being found on the ground unresponsive. Stroke protocol initiated. Altered mental status.   CT of brain-no acute fiindings CT -cervical spine- infiltrates in upper lungs CXR-concerning for pneumonia  Patient unable to provide history when RD at bedside. Chart reviewed.  Labs: Cr 1.26. Mag and Ammonia-wnl. CBG's-105,105, 89 mg/dl.   Medications: Insulin.  Drips: Lactated Ringers@ 75 ml/hr Zosyn  Intake/Output Summary (Last 24 hours) at 12/27/2019 1350 Last data filed at 12/27/2019 0500 Gross per 24 hour  Intake --  Output 300 ml  Net -300 ml     Weight change:   Patient weighed 85.3 kg last January. Currently 81.3 kg. Minimal change.  NUTRITION - FOCUSED PHYSICAL EXAM: Deferred - pt unable to participate today.  Diet Order:   Diet Order            Diet NPO time specified  Diet effective now              EDUCATION NEEDS:  Not appropriate for education at this time Skin:  Skin Assessment: Skin Integrity Issues: Skin Integrity Issues:: Other (Comment) Other: right upper forhead  Last BM:  Prior to admission  Height:   Ht Readings from Last 1 Encounters:  12/26/19 5\' 4"  (1.626 m)    Weight:   Wt Readings from Last 1 Encounters:  12/26/19 81.3 kg    Ideal Body Weight:   55 kg  BMI:  Body mass index is 30.77 kg/m.  Estimated Nutritional  Needs:   Kcal:  [id number] (MSJ x1.1-1.2)  Protein:  83-94  Fluid:  >1400 ml daily   Colman Cater MS,RD,CSG,LDN Office: [phone] Pager: [phone]

## 2019-12-27 NOTE — Plan of Care (Signed)
Plan of Care Reviewed. Patient altered
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
## 2019-12-27 NOTE — Progress Notes (Signed)
Patient combative. She refused neuro checks and NIH assessment. Refused vital sign check.

## 2019-12-27 NOTE — Progress Notes (Signed)
PROGRESS NOTE    Katie WOULARD  [id number] DOB: August 16, 1936 DOA: 12/26/2019 PCP: Jani Gravel, MD   Brief Narrative:  Per HPI: [name] is a 84 y.o. female with medical history significant for atrial fibrillation, diabetes mellitus, hypertension, systolic and diastolic CHF, asthma, CKD 3, gout.  Patient was brought to the ED via EMS.  Patient's was found down, lying in her front yard.  Patient's neighbor called EMS.   At the time of my evaluation, patient's eyes are closed, she moans and groans when hypoxia, and moves her extremities, she is status post 4mg  Ativan.  She is not answering questions. History is obtained from chart review and EDP. Patient's daughter talked to her at about 51 AM, and patient's son talked to patient about 30 minutes prior to arrival.  Per ED provider notes there was also reports of right-sided facial droop.  1/15: Patient was admitted with suspicion of acute metabolic encephalopathy in the setting of pneumonia and UTI.  She was started on Zosyn and doxycycline for aspiration coverage.  She was noted to have some facial droop and suspicion of CVA for which MRI brain and EEG work-up is currently pending.  Assessment & Plan:   Active Problems:   Acute metabolic encephalopathy   Acute encephalopathy-suspect metabolic -Awaiting brain MRI as well as EEG for further evaluation and work-up with need for neurology evaluation -Continue IV antibiotics for treatment of pneumonia as well as UTI and maintain on gentle fluid -Monitor closely  Pneumonia of left lower lobe -Influenza and Covid testing negative -Continue aspiration coverage with Zosyn -Repeat CBC and monitor -Antitussives and as needed breathing treatments  UTI -Continue Zosyn -Follow-up urine cultures  Prolonged QTC -Avoid QT prolonging medications -Continue monitor potassium and magnesium and replete  Diabetes -SSI every 4 hours -Hold home oral agents  Atrial fibrillation -Currently  rate controlled and will hold Cardizem while n.p.o. -IV labetalol for heart rate elevations -Continue home Eliquis once able  Hypertension-elevated -Hold home medications while n.p.o. -IV labetalol as needed  Systolic and diastolic CHF-currently euvolemic -Hold diuretics and monitor daily weights -Gentle IV fluid while n.p.o.  Asthma-stable -DuoNebs as needed for shortness of breath or wheezing  Gout -Currently without any acute flare -Hold home allopurinol for now  DVT prophylaxis: Heparin Code Status: Full Family Communication: We will plan to call daughter Disposition Plan: Brain MRI and EEG pending.  Continue current antibiotics and follow cultures.   Consultants:   None  Procedures:   None  Antimicrobials:  Anti-infectives (From admission, onward)   Start     Dose/Rate Route Frequency Ordered Stop   12/27/19 1700  doxycycline (VIBRAMYCIN) 100 mg in sodium chloride 0.9 % 250 mL IVPB  Status:  Discontinued     100 mg 125 mL/hr over 120 Minutes Intravenous Every 12 hours 12/26/19 1909 12/27/19 0952   12/26/19 2215  piperacillin-tazobactam (ZOSYN) IVPB 3.375 g     3.375 g 12.5 mL/hr over 240 Minutes Intravenous Every 8 hours 12/26/19 2201     12/26/19 1715  cefTRIAXone (ROCEPHIN) 1 g in sodium chloride 0.9 % 100 mL IVPB     1 g 200 mL/hr over 30 Minutes Intravenous  Once 12/26/19 1703 12/26/19 1902   12/26/19 1715  azithromycin (ZITHROMAX) 500 mg in sodium chloride 0.9 % 250 mL IVPB  Status:  Discontinued     500 mg 250 mL/hr over 60 Minutes Intravenous Every 24 hours 12/26/19 1703 12/26/19 1909       Subjective: Patient seen  and evaluated today and appears quite somnolent with no acute overnight events noted.  She is being restless for her brain MRI as well as EEG and daughter plans to be at bedside to help.  Objective: Vitals:   12/27/19 0220 12/27/19 0420 12/27/19 0620 12/27/19 0752  BP: (!) 158/91 (!) 152/78 (!) 141/109   Pulse: 72 70 67   Resp: (!) 24  (!) 22 (!) 21   Temp: 99.1 F (37.3 C) 98.3 F (36.8 C) 98.6 F (37 C)   TempSrc: Oral Oral    SpO2: 96% 96% 98% 96%  Weight:      Height:        Intake/Output Summary (Last 24 hours) at 12/27/2019 1241 Last data filed at 12/27/2019 0500 Gross per 24 hour  Intake --  Output 300 ml  Net -300 ml   Filed Weights   12/26/19 1534 12/26/19 2020  Weight: 82.6 kg 81.3 kg    Examination:  General exam: Appears somnolent Respiratory system: Clear to auscultation. Respiratory effort normal. Cardiovascular system: S1 & S2 heard, RRR. No JVD, murmurs, rubs, gallops or clicks. No pedal edema. Gastrointestinal system: Abdomen is nondistended, soft and nontender. No organomegaly or masses felt. Normal bowel sounds heard. Central nervous system: Somnolent and barely arousable Extremities: No significant edema Skin: No rashes, lesions or ulcers Psychiatry: Cannot be assessed.    Data Reviewed: I have personally reviewed following labs and imaging studies  CBC: Recent Labs  Lab 12/26/19 1440  WBC 6.5  NEUTROABS 2.8  HGB 12.5  HCT 41.7  MCV 88.9  PLT 123456   Basic Metabolic Panel: Recent Labs  Lab 12/26/19 1440  NA 141  K 3.5  CL 106  CO2 22  GLUCOSE 218*  BUN 16  CREATININE 1.56*  CALCIUM 9.3  MG 1.9   GFR: Estimated Creatinine Clearance: 29.2 mL/min (A) (by C-G formula based on SCr of 1.56 mg/dL (H)). Liver Function Tests: Recent Labs  Lab 12/26/19 1440  AST 16  ALT 8  ALKPHOS 98  BILITOT 0.4  PROT 7.6  ALBUMIN 3.7   No results for input(s): LIPASE, AMYLASE in the last 168 hours. Recent Labs  Lab 12/26/19 1642  AMMONIA 34   Coagulation Profile: Recent Labs  Lab 12/26/19 1440  INR 1.3*   Cardiac Enzymes: No results for input(s): CKTOTAL, CKMB, CKMBINDEX, TROPONINI in the last 168 hours. BNP (last 3 results) No results for input(s): PROBNP in the last 8760 hours. HbA1C: No results for input(s): HGBA1C in the last 72 hours. CBG: Recent Labs  Lab  12/26/19 2027 12/27/19 0032 12/27/19 0419 12/27/19 0742 12/27/19 1108  GLUCAP 116* 138* 105* 105* 89   Lipid Profile: No results for input(s): CHOL, HDL, LDLCALC, TRIG, CHOLHDL, LDLDIRECT in the last 72 hours. Thyroid Function Tests: Recent Labs    12/26/19 1440  TSH 5.282*   Anemia Panel: No results for input(s): VITAMINB12, FOLATE, FERRITIN, TIBC, IRON, RETICCTPCT in the last 72 hours. Sepsis Labs: No results for input(s): PROCALCITON, LATICACIDVEN in the last 168 hours.  Recent Results (from the past 240 hour(s))  Respiratory Panel by RT PCR (Flu A&B, Covid) - Nasopharyngeal Swab     Status: None   Collection Time: 12/26/19  4:19 PM   Specimen: Nasopharyngeal Swab  Result Value Ref Range Status   SARS Coronavirus 2 by RT PCR NEGATIVE NEGATIVE Final    Comment: (NOTE) SARS-CoV-2 target nucleic acids are NOT DETECTED. The SARS-CoV-2 RNA is generally detectable in upper respiratoy specimens during the  acute phase of infection. The lowest concentration of SARS-CoV-2 viral copies this assay can detect is 131 copies/mL. A negative result does not preclude SARS-Cov-2 infection and should not be used as the sole basis for treatment or other patient management decisions. A negative result may occur with  improper specimen collection/handling, submission of specimen other than nasopharyngeal swab, presence of viral mutation(s) within the areas targeted by this assay, and inadequate number of viral copies (<131 copies/mL). A negative result must be combined with clinical observations, patient history, and epidemiological information. The expected result is Negative. Fact Sheet for Patients:  PinkCheek.be Fact Sheet for Healthcare Providers:  GravelBags.it This test is not yet ap proved or cleared by the [place] FDA and  has been authorized for detection and/or diagnosis of SARS-CoV-2 by FDA under an Emergency Use  Authorization (EUA). This EUA will remain  in effect (meaning this test can be used) for the duration of the COVID-19 declaration under Section 564(b)(1) of the Act, 21 U.S.C. section 360bbb-3(b)(1), unless the authorization is terminated or revoked sooner.    Influenza A by PCR NEGATIVE NEGATIVE Final   Influenza B by PCR NEGATIVE NEGATIVE Final    Comment: (NOTE) The Xpert Xpress SARS-CoV-2/FLU/RSV assay is intended as an aid in  the diagnosis of influenza from Nasopharyngeal swab specimens and  should not be used as a sole basis for treatment. Nasal washings and  aspirates are unacceptable for Xpert Xpress SARS-CoV-2/FLU/RSV  testing. Fact Sheet for Patients: PinkCheek.be Fact Sheet for Healthcare Providers: GravelBags.it This test is not yet approved or cleared by the [place] FDA and  has been authorized for detection and/or diagnosis of SARS-CoV-2 by  FDA under an Emergency Use Authorization (EUA). This EUA will remain  in effect (meaning this test can be used) for the duration of the  Covid-19 declaration under Section 564(b)(1) of the Act, 21  U.S.C. section 360bbb-3(b)(1), unless the authorization is  terminated or revoked. Performed at [facility], [address] [place], [place] [address]          Radiology Studies: CT ANGIO CHEST PE W OR WO CONTRAST  Result Date: 12/26/2019 CLINICAL DATA:  Found down, abnormal chest radiograph, combative patient EXAM: CT ANGIOGRAPHY CHEST WITH CONTRAST TECHNIQUE: Multidetector CT imaging of the chest was performed using the standard protocol during bolus administration of intravenous contrast. Multiplanar CT image reconstructions and MIPs were obtained to evaluate the vascular anatomy. CONTRAST:  79mL OMNIPAQUE IOHEXOL 350 MG/ML SOLN COMPARISON:  CT 04/24/2018, chest radiograph 12/26/2019 FINDINGS: Cardiovascular: Satisfactory opacification of the pulmonary arteries however  evaluation is significantly limited due to extensive patient motion respiratory motion artifacts. No large central or lobar filling defects are identified. More distal evaluation is limited due to motion. Borderline cardiomegaly. Atherosclerotic calcifications of the coronary arteries are noted. Normal caliber thoracic aorta with extensive atherosclerotic plaque both calcified and noncalcified including some peripherally marginated plaque in the descending thoracic aorta. Normal 3 vessel branching of the aortic arch. Calcification of the proximal great vessels is noted as well. Mediastinum/Nodes: No mediastinal fluid or gas. Thyroid gland and thoracic inlet are unremarkable. No acute abnormality of the trachea or esophagus. Small amount of fluid seen in the distal thoracic esophagus, could reflect mild reflux. Lungs/Pleura: Evaluation of the lung parenchyma limited given respiratory motion artifact. Extensive atelectatic changes are noted posteriorly in the lungs. Corresponding well to the abnormality on radiography is a mixed consolidative and ground-glass opacity in the left lung base. Upper Abdomen: No acute abnormalities  present in the visualized portions of the upper abdomen. Musculoskeletal: Multilevel degenerative changes are present in the imaged portions of the spine. Additional degenerative changes throughout the shoulders. No acute osseous abnormality or suspicious osseous lesion. Evaluation for subtle fractures limited given motion. Review of the MIP images confirms the above findings. IMPRESSION: 1. Study is significantly limited due to extensive patient motion and respiratory motion artifacts. 2. No large central or lobar filling defects are identified. More distal evaluation is precluded due to artifact. 3. Left lower lobe mixed consolidative and ground-glass opacity, concerning for pneumonia or sequela of aspiration. Recommend follow-up imaging after the resolution of the acute symptoms to document  resolution. 4. Small amount of fluid in the distal thoracic esophagus, could reflect mild reflux. 5. Coronary artery atherosclerosis.  Borderline cardiomegaly. 6. Aortic Atherosclerosis (ICD10-I70.0). Electronically Signed   By: Lovena Le M.D.   On: 12/26/2019 16:50   CT CERVICAL SPINE WO CONTRAST  Result Date: 12/26/2019 CLINICAL DATA:  Neuro deficit, acute, stroke suspected. Additional history provided: Patient found face down on ground at [place] prior to arrival. EXAM: CT HEAD WITHOUT CONTRAST CT CERVICAL SPINE WITHOUT CONTRAST TECHNIQUE: Multidetector CT imaging of the head and cervical spine was performed following the standard protocol without intravenous contrast. Multiplanar CT image reconstructions of the cervical spine were also generated. COMPARISON:  Head CT 05/14/2018, brain MRI 05/15/2018. FINDINGS: CT HEAD FINDINGS Brain: There is no evidence of acute intracranial hemorrhage. Redemonstrated chronic left occipital lobe cortically based infarct. No new demarcated cortical infarction is identified. Redemonstrated chronic lacunar infarct within the deep right frontal lobe white matter. Additional mild ill-defined hypoattenuation within the cerebral white matter is nonspecific, but consistent with chronic small vessel ischemic disease. Mild generalized parenchymal atrophy. Redemonstrated chronic infarcts within the left greater than right cerebellar hemispheres. No evidence of intracranial mass. No midline shift or extra-axial fluid collection. Vascular: No hyperdense vessel.  Atherosclerotic calcifications. Skull: Normal. Negative for fracture or focal lesion. Unchanged multifocal calvarial thickening. Sinuses/Orbits: Visualized orbits demonstrate no acute abnormality. No significant paranasal sinus disease or mastoid effusion. These results were called by telephone at the time of interpretation on 12/26/2019 at 4:47 pm to provider Dr. Sabra Heck, who verbally acknowledged these results. CT CERVICAL  SPINE FINDINGS Alignment: Mild leftward rotation of C1 relative to C2 may be related to patient head position at the time of examination. A cervical dextrocurvature may also be positional. Reversal of the expected cervical lordosis. No significant spondylolisthesis. Skull base and vertebrae: The basion-dental and atlanto-dental intervals are maintained.No evidence of acute fracture to the cervical spine. Soft tissues and spinal canal: No prevertebral fluid or swelling. No visible canal hematoma. Disc levels: Cervical spondylosis with multilevel disc height loss, posterior disc osteophytes, uncovertebral and facet hypertrophy. There is also multilevel ossification of the posterior longitudinal ligament. Prominent ventrolateral osteophyte at C6-C7. Upper chest: No consolidation within the imaged lung apices. Calcified atherosclerotic plaque within the visualized aortic arch and proximal major branch vessels of the neck. IMPRESSION: CT head: 1. No evidence of acute intracranial abnormality. 2. Redemonstration of multiple chronic infarcts as detailed. 3. Background mild generalized parenchymal atrophy and chronic small vessel ischemic disease. CT cervical spine: 1. No evidence of acute fracture to the cervical spine. 2. Cervical spondylosis with multilevel ossification of the posterior longitudinal ligament. Electronically Signed   By: Kellie Simmering DO   On: 12/26/2019 16:54   DG Chest Portable 1 View  Result Date: 12/26/2019 CLINICAL DATA:  Altered mental status EXAM: PORTABLE CHEST 1  VIEW COMPARISON:  06/27/2019, 01/22/2019 FINDINGS: Low lung volumes. Mild cardiomegaly with aortic atherosclerosis. Mild opacity at the cardiac apex. No pneumothorax. IMPRESSION: Low lung volumes with mild cardiomegaly. Opacity at the left cardiac apex, could be secondary to prominent epicardial fat though atelectasis or small focus airspace disease/pneumonia could also be considered. Electronically Signed   By: Donavan Foil M.D.    On: 12/26/2019 15:32   CT HEAD CODE STROKE WO CONTRAST  Result Date: 12/26/2019 CLINICAL DATA:  Neuro deficit, acute, stroke suspected. Additional history provided: Patient found face down on ground at [place] prior to arrival. EXAM: CT HEAD WITHOUT CONTRAST CT CERVICAL SPINE WITHOUT CONTRAST TECHNIQUE: Multidetector CT imaging of the head and cervical spine was performed following the standard protocol without intravenous contrast. Multiplanar CT image reconstructions of the cervical spine were also generated. COMPARISON:  Head CT 05/14/2018, brain MRI 05/15/2018. FINDINGS: CT HEAD FINDINGS Brain: There is no evidence of acute intracranial hemorrhage. Redemonstrated chronic left occipital lobe cortically based infarct. No new demarcated cortical infarction is identified. Redemonstrated chronic lacunar infarct within the deep right frontal lobe white matter. Additional mild ill-defined hypoattenuation within the cerebral white matter is nonspecific, but consistent with chronic small vessel ischemic disease. Mild generalized parenchymal atrophy. Redemonstrated chronic infarcts within the left greater than right cerebellar hemispheres. No evidence of intracranial mass. No midline shift or extra-axial fluid collection. Vascular: No hyperdense vessel.  Atherosclerotic calcifications. Skull: Normal. Negative for fracture or focal lesion. Unchanged multifocal calvarial thickening. Sinuses/Orbits: Visualized orbits demonstrate no acute abnormality. No significant paranasal sinus disease or mastoid effusion. These results were called by telephone at the time of interpretation on 12/26/2019 at 4:47 pm to provider Dr. Sabra Heck, who verbally acknowledged these results. CT CERVICAL SPINE FINDINGS Alignment: Mild leftward rotation of C1 relative to C2 may be related to patient head position at the time of examination. A cervical dextrocurvature may also be positional. Reversal of the expected cervical lordosis. No significant  spondylolisthesis. Skull base and vertebrae: The basion-dental and atlanto-dental intervals are maintained.No evidence of acute fracture to the cervical spine. Soft tissues and spinal canal: No prevertebral fluid or swelling. No visible canal hematoma. Disc levels: Cervical spondylosis with multilevel disc height loss, posterior disc osteophytes, uncovertebral and facet hypertrophy. There is also multilevel ossification of the posterior longitudinal ligament. Prominent ventrolateral osteophyte at C6-C7. Upper chest: No consolidation within the imaged lung apices. Calcified atherosclerotic plaque within the visualized aortic arch and proximal major branch vessels of the neck. IMPRESSION: CT head: 1. No evidence of acute intracranial abnormality. 2. Redemonstration of multiple chronic infarcts as detailed. 3. Background mild generalized parenchymal atrophy and chronic small vessel ischemic disease. CT cervical spine: 1. No evidence of acute fracture to the cervical spine. 2. Cervical spondylosis with multilevel ossification of the posterior longitudinal ligament. Electronically Signed   By: Kellie Simmering DO   On: 12/26/2019 16:54        Scheduled Meds: . chlorhexidine  15 mL Mouth Rinse BID  . heparin  5,000 Units Subcutaneous Q8H  . insulin aspart  0-9 Units Subcutaneous Q4H  . mouth rinse  15 mL Mouth Rinse q12n4p   Continuous Infusions: . lactated ringers    . piperacillin-tazobactam (ZOSYN)  IV 3.375 g (12/27/19 0527)     LOS: 1 day    Time spent: 30 minutes    Merrell Rettinger Darleen Crocker, DO [facility] Pager [phone]  If 7PM-7AM, please contact night-coverage [email or web address] Password [facility] 12/27/2019, 12:41 PM

## 2019-12-27 NOTE — Progress Notes (Signed)
Called daughter, [name], to come sit with mom during her EEG and MRI.

## 2019-12-27 NOTE — Progress Notes (Signed)
Unable to accurately perform neuro checks as patient does not cooperate. She will tell me her name and she will tell me that she is in no pain but will not follow other commands. Mitts in place as patient has been combative and pulled IV out on another shift. Pt daughter, Webb Silversmith, called for an update. Continuing to monitor patient.

## 2019-12-27 NOTE — Progress Notes (Signed)
SLP Cancellation Note  Patient Details Name: [name] MRN: [id number] DOB: 08-Jun-1936   Cancelled treatment:       Reason Eval/Treat Not Completed: Fatigue/lethargy limiting ability to participate. Pt was recently administered Ativan after being combative with RN this am. SLP will re-attempt later today as schedule permits.  Arwen Haseley H. Roddie Mc, CCC-SLP Speech Language Pathologist   Wende Bushy 12/27/2019, 11:09 AM

## 2019-12-27 NOTE — Progress Notes (Signed)
EEG complete - results pending 

## 2019-12-27 NOTE — Procedures (Addendum)
Patient Name: GELENE POSTLE  MRN: [id number]  Epilepsy Attending: Lora Havens  Referring Physician/Provider: Dr Jenetta Downer Date: 12/27/2019 Duration: 23.49 mins  Patient history: 84 year old female with chronic CVAs now presented with altered mental status.  EEG evaluate for seizures.  Level of alertness:awake  AEDs during EEG study: Lorazepam  Technical aspects: This EEG study was done with scalp electrodes positioned according to the 10-20 International system of electrode placement. Electrical activity was acquired at a sampling rate of 500Hz  and reviewed with a high frequency filter of 70Hz  and a low frequency filter of 1Hz . EEG data were recorded continuously and digitally stored.   Description: No clear posterior dominant rhythm was seen. EEG showed continuous generalized 3-6Hz  theta-delta slowing. Hyperventilation and photic stimulation were not performed.  Of note, study was technically difficult as patient was constantly moving and pulling electrodes.  Abnormality - Continuous slow, generalized  IMPRESSION: This technically difficult study is suggestive of moderate diffuse encephalopathy. No seizures or epileptiform discharges were seen throughout the recording.  Malaky Tetrault Barbra Sarks

## 2019-12-27 NOTE — Care Management Important Message (Signed)
Important Message  Patient Details  Name: [name] MRN: [id number] Date of Birth: 04-Aug-1936   Medicare Important Message Given:  Yes     Tommy Medal 12/27/2019, 3:33 PM

## 2019-12-28 ENCOUNTER — Inpatient Hospital Stay: Payer: Self-pay

## 2019-12-28 ENCOUNTER — Inpatient Hospital Stay (HOSPITAL_COMMUNITY): Payer: Medicare Other

## 2019-12-28 LAB — BLOOD GAS, ARTERIAL
Acid-Base Excess: 1.2 mmol/L (ref 0.0–2.0)
Bicarbonate: 25.5 mmol/L (ref 20.0–28.0)
FIO2: 32
O2 Saturation: 97.9 %
Patient temperature: 37
pCO2 arterial: 40 mmHg (ref 32.0–48.0)
pH, Arterial: 7.416 (ref 7.350–7.450)
pO2, Arterial: 106 mmHg (ref 83.0–108.0)

## 2019-12-28 LAB — GLUCOSE, CAPILLARY
Glucose-Capillary: 115 mg/dL — ABNORMAL HIGH (ref 70–99)
Glucose-Capillary: 109 mg/dL — ABNORMAL HIGH (ref 70–99)
Glucose-Capillary: 92 mg/dL (ref 70–99)
Glucose-Capillary: 79 mg/dL (ref 70–99)
Glucose-Capillary: 91 mg/dL (ref 70–99)

## 2019-12-28 LAB — APTT: aPTT: 151 seconds — ABNORMAL HIGH (ref 24–36)

## 2019-12-28 LAB — CBC
HCT: 39.3 % (ref 36.0–46.0)
Hemoglobin: 11.8 g/dL — ABNORMAL LOW (ref 12.0–15.0)
MCH: 26.8 pg (ref 26.0–34.0)
MCHC: 30 g/dL (ref 30.0–36.0)
MCV: 89.1 fL (ref 80.0–100.0)
Platelets: 236 10*3/uL (ref 150–400)
RBC: 4.41 MIL/uL (ref 3.87–5.11)
RDW: 13.6 % (ref 11.5–15.5)
WBC: 5.5 10*3/uL (ref 4.0–10.5)
nRBC: 0 % (ref 0.0–0.2)

## 2019-12-28 LAB — AMMONIA: Ammonia: 16 umol/L (ref 9–35)

## 2019-12-28 LAB — BASIC METABOLIC PANEL
Anion gap: 13 (ref 5–15)
BUN: 9 mg/dL (ref 8–23)
CO2: 20 mmol/L — ABNORMAL LOW (ref 22–32)
Calcium: 9.1 mg/dL (ref 8.9–10.3)
Chloride: 107 mmol/L (ref 98–111)
Creatinine, Ser: 1.35 mg/dL — ABNORMAL HIGH (ref 0.44–1.00)
GFR calc Af Amer: 43 mL/min — ABNORMAL LOW (ref >60)
GFR calc non Af Amer: 37 mL/min — ABNORMAL LOW (ref >60)
Glucose, Bld: 118 mg/dL — ABNORMAL HIGH (ref 70–99)
Potassium: 4.1 mmol/L (ref 3.5–5.1)
Sodium: 140 mmol/L (ref 135–145)

## 2019-12-28 LAB — T4, FREE: Free T4: 1.13 ng/dL — ABNORMAL HIGH (ref 0.61–1.12)

## 2019-12-28 LAB — HEPARIN LEVEL (UNFRACTIONATED): Heparin Unfractionated: 0.82 IU/mL — ABNORMAL HIGH (ref 0.30–0.70)

## 2019-12-28 LAB — CK: Total CK: 255 U/L — ABNORMAL HIGH (ref 38–234)

## 2019-12-28 MED ORDER — CHLORHEXIDINE GLUCONATE CLOTH 2 % EX PADS
6.0000 | MEDICATED_PAD | Freq: Every day | CUTANEOUS | Status: DC
  Administered 2019-12-28 – 2019-12-31 (×4): 6 via TOPICAL

## 2019-12-28 MED ORDER — HEPARIN (PORCINE) 25000 UT/250ML-% IV SOLN
900.0000 [IU]/h | INTRAVENOUS | Status: AC
  Administered 2019-12-28: 1050 [IU]/h via INTRAVENOUS
  Filled 2019-12-28: qty 250

## 2019-12-28 MED ORDER — HEPARIN BOLUS VIA INFUSION
3750.0000 [IU] | Freq: Once | INTRAVENOUS | Status: AC
  Administered 2019-12-28: 3750 [IU] via INTRAVENOUS
  Filled 2019-12-28: qty 3750

## 2019-12-28 MED ORDER — ORAL CARE MOUTH RINSE
15.0000 mL | Freq: Two times a day (BID) | OROMUCOSAL | Status: DC
  Administered 2019-12-28 – 2019-12-31 (×5): 15 mL via OROMUCOSAL

## 2019-12-28 MED ORDER — SODIUM CHLORIDE 0.9% FLUSH: 10.0000 mL | INTRAVENOUS | Status: DC | PRN

## 2019-12-28 MED ORDER — SODIUM CHLORIDE 0.9% FLUSH: 10.0000 mL | Freq: Two times a day (BID) | INTRAVENOUS | Status: DC

## 2019-12-28 NOTE — Evaluation (Signed)
Clinical/Bedside Swallow Evaluation Patient Details  Name: [name] MRN: [id number] Date of Birth: 10-05-1936  Today's Date: 12/28/2019 Time: SLP Start Time (ACUTE ONLY): 1140 SLP Stop Time (ACUTE ONLY): 1200 SLP Time Calculation (min) (ACUTE ONLY): 20 min  Past Medical History:  Past Medical History:  Diagnosis Date  . Asthma   . Bronchitis   . Carotid artery disease ([place])   . Coronary atherosclerosis of native coronary artery    Nonobstructive at cath 2005  . DM2 (diabetes mellitus, type 2) ([place])   . Essential hypertension   . Gout   . History of stroke   . Hypothyroidism   . Influenza 2013  . Nonischemic cardiomyopathy ([facility])    LVEF 20-25%  . PAF (paroxysmal atrial fibrillation) ([place])    a. diagnosed in 04/2018. Started on Eliquis for anticoagulation.   . Renal artery stenosis ([facility])    Bilateral   Past Surgical History:  Past Surgical History:  Procedure Laterality Date  . ABDOMINAL HYSTERECTOMY    . CATARACT EXTRACTION W/PHACO Left 01/18/2019   Procedure: CATARACT EXTRACTION PHACO AND INTRAOCULAR LENS PLACEMENT ([place]);  Surgeon: Baruch Goldmann, MD;  Location: [facility];  Service: Ophthalmology;  Laterality: Left;  CDE: 11.99  . CATARACT EXTRACTION W/PHACO Right 02/26/2019   Procedure: CATARACT EXTRACTION PHACO AND INTRAOCULAR LENS PLACEMENT RIGHT EYE (CDE: 13.46);  Surgeon: Baruch Goldmann, MD;  Location: [facility];  Service: Ophthalmology;  Laterality: Right;   HPI:  [name] is a 84 y.o. female with medical history significant for atrial fibrillation, diabetes mellitus, hypertension, systolic and diastolic CHF, asthma, CKD 3, gout.  Patient was brought to the ED via EMS.  Patient's was found down, lying in her front yard.  Patient's neighbor called EMS. At the time of my evaluation, patient's eyes are closed, she moans and groans when hypoxia, and moves her extremities, she is status post 4mg  Ativan.  She is not answering questions. History is obtained from chart  review and EDP. Patient's daughter talked to her at about 47 AM, and patient's son talked to patient about 30 minutes prior to arrival.  Per ED provider notes there was also reports of right-sided facial droop. Stat head and cervical CT-negative for acute intracranial abnormality, multiple chronic infarcts. MRI is recommended not yet pending. Hospitalist to admit for altered mental status, UTI and possible aspiration pneumonia. Pt failed [facility] Swallow screen secondary to lethargy and inability to stay awake/alert. BSE ordered.   Assessment / Plan / Recommendation Clinical Impression  Pt continues to be lethargic, however increased alertness today. Pt without gross facial asymmetry, however she was cognitively unable to follow directions during oral motor examination. Pt leaning to the right in bed and required repositioning. Vocal quality is clear. Pt did not follow commands for attempts of cough or swallow. Pt assessed with ice chips and she immediately expectorated and shouted, "No!". Pt not yet appropriate for po given lethargy and reduced ability to participate. Recommend continue NPO with alternative means for medication and ORAL care and ice chips if Pt alert and willing. Above to RN. SLP will follow.  SLP Visit Diagnosis: Dysphagia, unspecified (R13.10)    Aspiration Risk  Risk for inadequate nutrition/hydration;Mild aspiration risk    Diet Recommendation Ice chips PRN after oral care;NPO   Medication Administration: Via alternative means    Other  Recommendations Oral Care Recommendations: Oral care prior to ice chip/H20;Oral care QID;Staff/trained caregiver to provide oral care   Follow up Recommendations Skilled Nursing facility  Frequency and Duration min 2x/week  1 week       Prognosis Prognosis for Safe Diet Advancement: Fair Barriers to Reach Goals: Cognitive deficits;Behavior      Swallow Study   General Date of Onset: 12/26/19 HPI: [name] is a 84 y.o.  female with medical history significant for atrial fibrillation, diabetes mellitus, hypertension, systolic and diastolic CHF, asthma, CKD 3, gout.  Patient was brought to the ED via EMS.  Patient's was found down, lying in her front yard.  Patient's neighbor called EMS. At the time of my evaluation, patient's eyes are closed, she moans and groans when hypoxia, and moves her extremities, she is status post 4mg  Ativan.  She is not answering questions. History is obtained from chart review and EDP. Patient's daughter talked to her at about 69 AM, and patient's son talked to patient about 30 minutes prior to arrival.  Per ED provider notes there was also reports of right-sided facial droop. Stat head and cervical CT-negative for acute intracranial abnormality, multiple chronic infarcts. MRI is recommended not yet pending. Hospitalist to admit for altered mental status, UTI and possible aspiration pneumonia. Pt failed [facility] Swallow screen secondary to lethargy and inability to stay awake/alert. BSE ordered. Type of Study: Bedside Swallow Evaluation Previous Swallow Assessment: None on record Diet Prior to this Study: NPO Temperature Spikes Noted: No Respiratory Status: Nasal cannula History of Recent Intubation: No Behavior/Cognition: Lethargic/Drowsy;Uncooperative;Requires cueing;Doesn't follow directions Oral Cavity Assessment: Within Functional Limits Oral Care Completed by SLP: Yes Oral Cavity - Dentition: Edentulous Vision: Impaired for self-feeding Self-Feeding Abilities: Total assist Patient Positioning: Upright in bed Baseline Vocal Quality: Normal Volitional Cough: Cognitively unable to elicit Volitional Swallow: Unable to elicit    Oral/Motor/Sensory Function Overall Oral Motor/Sensory Function: (unable to assess due to reduced Pt compliance/participation)   Ice Chips Ice chips: Impaired Presentation: Spoon Oral Phase Impairments: Reduced labial seal;Reduced lingual  movement/coordination Oral Phase Functional Implications: (Pt expectorated)   Thin Liquid Thin Liquid: Not tested    Nectar Thick Nectar Thick Liquid: Not tested   Honey Thick Honey Thick Liquid: Not tested   Puree Puree: Not tested   Solid     Solid: Not tested     Thank you,  Genene Churn, Concepcion  Mel Tadros 12/28/2019,12:04 PM

## 2019-12-28 NOTE — Progress Notes (Signed)
Attempted IV access via ultrasound x 2 without success. Pt did not tolerate procedure well.

## 2019-12-28 NOTE — Progress Notes (Addendum)
PROGRESS NOTE    [name]  [id number] DOB: 04/16/1936 DOA: 12/26/2019 PCP: Jani Gravel, MD   Brief Narrative:  Per HPI: [name] a 84 y.o.femalewith medical history significant foratrial fibrillation, diabetes mellitus, hypertension, systolic and diastolic CHF, asthma, CKD 3, gout.Patient was brought to the ED via EMS.Patient's was found down, lying in her front yard. Patient's neighbor called EMS.  At the time of my evaluation, patient's eyes are closed, she moans and groans when hypoxia, and moves her extremities, she is status post4mg Ativan. She is not answering questions. History is obtained from chart reviewand EDP.Patient's daughter talked to her at about 19 AM, and patient's son talked to patient about 30 minutes prior to arrival. Per ED provider notes there was also reports of right-sided facial droop.  1/15: Patient was admitted with suspicion of acute metabolic encephalopathy in the setting of pneumonia and UTI.  She was started on Zosyn and doxycycline for aspiration coverage.  She was noted to have some facial droop and suspicion of CVA for which MRI brain and EEG work-up is currently pending.  1/16: Patient with gram-negative rods noted in urine culture and EEG with no seizure activity.  Neurology has evaluated patient and suspects that unwitnessed seizure took place especially in light of elevated prolactin level.  Started on Keppra IV twice daily.  She still remains mostly unresponsive.  Assessment & Plan:   Active Problems:   Acute metabolic encephalopathy   Acute encephalopathy-suspect metabolic versus postictal -EEG with no seizure activity noted -Brain MRI still pending -Appreciate neurology recommendations with initiation of Keppra IV twice daily -Continue IV antibiotics for treatment of pneumonia as well as UTI and maintain on gentle fluid -Monitor closely -Will check T4, ABG, and ammonia levels  Pneumonia of left lower  lobe -Influenza and Covid testing negative -Continue aspiration coverage with Zosyn -Repeat CBC and monitor -Antitussives and as needed breathing treatments  Gram-negative rod UTI -Continue Zosyn -Follow-up urine cultures for ID and sensitivity  Prolonged QTC -Avoid QT prolonging medications -Continue monitor potassium and magnesium and replete  Diabetes -SSI every 4 hours -Hold home oral agents -Hemoglobin A1c 6.6%  Atrial fibrillation -Currently rate controlled and will hold Cardizem while n.p.o. -IV labetalol for heart rate elevations -Continue home Eliquis once able  Hypertension-elevated -Hold home medications while n.p.o. -IV labetalol as needed  Systolic and diastolic CHF-currently euvolemic -Hold diuretics and monitor daily weights -Gentle IV fluid while n.p.o.  Asthma-stable -DuoNebs as needed for shortness of breath or wheezing  Gout -Currently without any acute flare -Hold home allopurinol for now  DVT prophylaxis:  Start heparin drip today Code Status: Full Family Communication: We will plan to call daughter Disposition Plan:  Brain MRI pending and EEG with no seizure activity.  Patient started on Keppra IV twice daily.  Start heparin drip today.   Consultants:   Neurology  Procedures:   EEG 1/15  Antimicrobials:  Anti-infectives (From admission, onward)   Start     Dose/Rate Route Frequency Ordered Stop   12/27/19 1700  doxycycline (VIBRAMYCIN) 100 mg in sodium chloride 0.9 % 250 mL IVPB  Status:  Discontinued     100 mg 125 mL/hr over 120 Minutes Intravenous Every 12 hours 12/26/19 1909 12/27/19 0952   12/26/19 2215  piperacillin-tazobactam (ZOSYN) IVPB 3.375 g     3.375 g 12.5 mL/hr over 240 Minutes Intravenous Every 8 hours 12/26/19 2201     12/26/19 1715  cefTRIAXone (ROCEPHIN) 1 g in sodium chloride 0.9 % 100  mL IVPB     1 g 200 mL/hr over 30 Minutes Intravenous  Once 12/26/19 1703 12/26/19 1902   12/26/19 1715   azithromycin (ZITHROMAX) 500 mg in sodium chloride 0.9 % 250 mL IVPB  Status:  Discontinued     500 mg 250 mL/hr over 60 Minutes Intravenous Every 24 hours 12/26/19 1703 12/26/19 1909       Subjective: Patient seen and evaluated today and is otherwise unresponsive.  No acute overnight events noted.  Objective: Vitals:   12/28/19 0000 12/28/19 0203 12/28/19 0415 12/28/19 1040  BP: 140/90 (!) 160/105 (!) 145/90   Pulse: 84 81 82   Resp: 20 20 20    Temp: 98.2 F (36.8 C) 98.2 F (36.8 C) 98.6 F (37 C)   TempSrc: Axillary Oral Oral   SpO2: 98% 100% 100% 100%  Weight:   81.5 kg   Height:        Intake/Output Summary (Last 24 hours) at 12/28/2019 1142 Last data filed at 12/28/2019 0613 Gross per 24 hour  Intake 181.37 ml  Output 400 ml  Net -218.63 ml   Filed Weights   12/26/19 1534 12/26/19 2020 12/28/19 0415  Weight: 82.6 kg 81.3 kg 81.5 kg    Examination:  General exam: Appears somnolent Respiratory system: Clear to auscultation. Respiratory effort normal. Cardiovascular system: S1 & S2 heard, RRR. No JVD, murmurs, rubs, gallops or clicks. No pedal edema. Gastrointestinal system: Abdomen is nondistended, soft and nontender. No organomegaly or masses felt. Normal bowel sounds heard. Central nervous system: Somnolent and unresponsive Extremities: No edema Skin: No rashes, lesions or ulcers Psychiatry: Cannot be assessed    Data Reviewed: I have personally reviewed following labs and imaging studies  CBC: Recent Labs  Lab 12/26/19 1440 12/27/19 2301  WBC 6.5 6.9  NEUTROABS 2.8  --   HGB 12.5 12.0  HCT 41.7 40.3  MCV 88.9 88.4  PLT 266 [id number]   Basic Metabolic Panel: Recent Labs  Lab 12/26/19 1440 12/27/19 0630 12/28/19 0937  NA 141 143 140  K 3.5 4.4 4.1  CL 106 110 107  CO2 22 19* 20*  GLUCOSE 218* 86 118*  BUN 16 12 9   CREATININE 1.56* 1.26* 1.35*  CALCIUM 9.3 9.2 9.1  MG 1.9  --   --    GFR: Estimated Creatinine Clearance: 33.7 mL/min (A) (by  C-G formula based on SCr of 1.35 mg/dL (H)). Liver Function Tests: Recent Labs  Lab 12/26/19 1440  AST 16  ALT 8  ALKPHOS 98  BILITOT 0.4  PROT 7.6  ALBUMIN 3.7   No results for input(s): LIPASE, AMYLASE in the last 168 hours. Recent Labs  Lab 12/26/19 1642  AMMONIA 34   Coagulation Profile: Recent Labs  Lab 12/26/19 1440  INR 1.3*   Cardiac Enzymes: Recent Labs  Lab 12/27/19 2301  CKTOTAL 255*   BNP (last 3 results) No results for input(s): PROBNP in the last 8760 hours. HbA1C: Recent Labs    12/26/19 0500  HGBA1C 6.6*   CBG: Recent Labs  Lab 12/27/19 2016 12/27/19 2327 12/28/19 0441 12/28/19 0723 12/28/19 1104  GLUCAP 116* 117* 115* 109* 92   Lipid Profile: Recent Labs    12/27/19 1141  CHOL 194  HDL 39*  LDLCALC 126*  TRIG 143  CHOLHDL 5.0   Thyroid Function Tests: Recent Labs    12/26/19 1440  TSH 5.282*   Anemia Panel: No results for input(s): VITAMINB12, FOLATE, FERRITIN, TIBC, IRON, RETICCTPCT in the last 72 hours. Sepsis  Labs: No results for input(s): PROCALCITON, LATICACIDVEN in the last 168 hours.  Recent Results (from the past 240 hour(s))  Respiratory Panel by RT PCR (Flu A&B, Covid) - Nasopharyngeal Swab     Status: None   Collection Time: 12/26/19  4:19 PM   Specimen: Nasopharyngeal Swab  Result Value Ref Range Status   SARS Coronavirus 2 by RT PCR NEGATIVE NEGATIVE Final    Comment: (NOTE) SARS-CoV-2 target nucleic acids are NOT DETECTED. The SARS-CoV-2 RNA is generally detectable in upper respiratoy specimens during the acute phase of infection. The lowest concentration of SARS-CoV-2 viral copies this assay can detect is 131 copies/mL. A negative result does not preclude SARS-Cov-2 infection and should not be used as the sole basis for treatment or other patient management decisions. A negative result may occur with  improper specimen collection/handling, submission of specimen other than nasopharyngeal swab, presence  of viral mutation(s) within the areas targeted by this assay, and inadequate number of viral copies (<131 copies/mL). A negative result must be combined with clinical observations, patient history, and epidemiological information. The expected result is Negative. Fact Sheet for Patients:  PinkCheek.be Fact Sheet for Healthcare Providers:  GravelBags.it This test is not yet ap proved or cleared by the [place] FDA and  has been authorized for detection and/or diagnosis of SARS-CoV-2 by FDA under an Emergency Use Authorization (EUA). This EUA will remain  in effect (meaning this test can be used) for the duration of the COVID-19 declaration under Section 564(b)(1) of the Act, 21 U.S.C. section 360bbb-3(b)(1), unless the authorization is terminated or revoked sooner.    Influenza A by PCR NEGATIVE NEGATIVE Final   Influenza B by PCR NEGATIVE NEGATIVE Final    Comment: (NOTE) The Xpert Xpress SARS-CoV-2/FLU/RSV assay is intended as an aid in  the diagnosis of influenza from Nasopharyngeal swab specimens and  should not be used as a sole basis for treatment. Nasal washings and  aspirates are unacceptable for Xpert Xpress SARS-CoV-2/FLU/RSV  testing. Fact Sheet for Patients: PinkCheek.be Fact Sheet for Healthcare Providers: GravelBags.it This test is not yet approved or cleared by the [place] FDA and  has been authorized for detection and/or diagnosis of SARS-CoV-2 by  FDA under an Emergency Use Authorization (EUA). This EUA will remain  in effect (meaning this test can be used) for the duration of the  Covid-19 declaration under Section 564(b)(1) of the Act, 21  U.S.C. section 360bbb-3(b)(1), unless the authorization is  terminated or revoked. Performed at [facility], [address] [place], [place] [address]   Urine culture     Status: Abnormal (Preliminary  result)   Collection Time: 12/26/19  7:11 PM   Specimen: Urine, Clean Catch  Result Value Ref Range Status   Specimen Description   Final    URINE, CLEAN CATCH Performed at [facility], [address], [place], [place] [address]    Special Requests   Final    NONE Performed at [facility], [address], [place], [place] [address]    Culture (A)  Final    >=100,000 COLONIES/mL GRAM NEGATIVE RODS IDENTIFICATION AND SUSCEPTIBILITIES TO FOLLOW Performed at [facility], [place] [address], [place], [place] [address]    Report Status PENDING  Incomplete         Radiology Studies: CT ANGIO CHEST PE W OR WO CONTRAST  Result Date: 12/26/2019 CLINICAL DATA:  Found down, abnormal chest radiograph, combative patient EXAM: CT ANGIOGRAPHY CHEST WITH CONTRAST TECHNIQUE: Multidetector CT  imaging of the chest was performed using the standard protocol during bolus administration of intravenous contrast. Multiplanar CT image reconstructions and MIPs were obtained to evaluate the vascular anatomy. CONTRAST:  50mL OMNIPAQUE IOHEXOL 350 MG/ML SOLN COMPARISON:  CT 04/24/2018, chest radiograph 12/26/2019 FINDINGS: Cardiovascular: Satisfactory opacification of the pulmonary arteries however evaluation is significantly limited due to extensive patient motion respiratory motion artifacts. No large central or lobar filling defects are identified. More distal evaluation is limited due to motion. Borderline cardiomegaly. Atherosclerotic calcifications of the coronary arteries are noted. Normal caliber thoracic aorta with extensive atherosclerotic plaque both calcified and noncalcified including some peripherally marginated plaque in the descending thoracic aorta. Normal 3 vessel branching of the aortic arch. Calcification of the proximal great vessels is noted as well. Mediastinum/Nodes: No mediastinal fluid or gas. Thyroid gland and thoracic inlet are unremarkable. No acute abnormality of the trachea or  esophagus. Small amount of fluid seen in the distal thoracic esophagus, could reflect mild reflux. Lungs/Pleura: Evaluation of the lung parenchyma limited given respiratory motion artifact. Extensive atelectatic changes are noted posteriorly in the lungs. Corresponding well to the abnormality on radiography is a mixed consolidative and ground-glass opacity in the left lung base. Upper Abdomen: No acute abnormalities present in the visualized portions of the upper abdomen. Musculoskeletal: Multilevel degenerative changes are present in the imaged portions of the spine. Additional degenerative changes throughout the shoulders. No acute osseous abnormality or suspicious osseous lesion. Evaluation for subtle fractures limited given motion. Review of the MIP images confirms the above findings. IMPRESSION: 1. Study is significantly limited due to extensive patient motion and respiratory motion artifacts. 2. No large central or lobar filling defects are identified. More distal evaluation is precluded due to artifact. 3. Left lower lobe mixed consolidative and ground-glass opacity, concerning for pneumonia or sequela of aspiration. Recommend follow-up imaging after the resolution of the acute symptoms to document resolution. 4. Small amount of fluid in the distal thoracic esophagus, could reflect mild reflux. 5. Coronary artery atherosclerosis.  Borderline cardiomegaly. 6. Aortic Atherosclerosis (ICD10-I70.0). Electronically Signed   By: Lovena Le M.D.   On: 12/26/2019 16:50   CT CERVICAL SPINE WO CONTRAST  Result Date: 12/26/2019 CLINICAL DATA:  Neuro deficit, acute, stroke suspected. Additional history provided: Patient found face down on ground at [place] prior to arrival. EXAM: CT HEAD WITHOUT CONTRAST CT CERVICAL SPINE WITHOUT CONTRAST TECHNIQUE: Multidetector CT imaging of the head and cervical spine was performed following the standard protocol without intravenous contrast. Multiplanar CT image reconstructions  of the cervical spine were also generated. COMPARISON:  Head CT 05/14/2018, brain MRI 05/15/2018. FINDINGS: CT HEAD FINDINGS Brain: There is no evidence of acute intracranial hemorrhage. Redemonstrated chronic left occipital lobe cortically based infarct. No new demarcated cortical infarction is identified. Redemonstrated chronic lacunar infarct within the deep right frontal lobe white matter. Additional mild ill-defined hypoattenuation within the cerebral white matter is nonspecific, but consistent with chronic small vessel ischemic disease. Mild generalized parenchymal atrophy. Redemonstrated chronic infarcts within the left greater than right cerebellar hemispheres. No evidence of intracranial mass. No midline shift or extra-axial fluid collection. Vascular: No hyperdense vessel.  Atherosclerotic calcifications. Skull: Normal. Negative for fracture or focal lesion. Unchanged multifocal calvarial thickening. Sinuses/Orbits: Visualized orbits demonstrate no acute abnormality. No significant paranasal sinus disease or mastoid effusion. These results were called by telephone at the time of interpretation on 12/26/2019 at 4:47 pm to provider Dr. Sabra Heck, who verbally acknowledged these results. CT CERVICAL SPINE FINDINGS Alignment: Mild leftward rotation  of C1 relative to C2 may be related to patient head position at the time of examination. A cervical dextrocurvature may also be positional. Reversal of the expected cervical lordosis. No significant spondylolisthesis. Skull base and vertebrae: The basion-dental and atlanto-dental intervals are maintained.No evidence of acute fracture to the cervical spine. Soft tissues and spinal canal: No prevertebral fluid or swelling. No visible canal hematoma. Disc levels: Cervical spondylosis with multilevel disc height loss, posterior disc osteophytes, uncovertebral and facet hypertrophy. There is also multilevel ossification of the posterior longitudinal ligament. Prominent  ventrolateral osteophyte at C6-C7. Upper chest: No consolidation within the imaged lung apices. Calcified atherosclerotic plaque within the visualized aortic arch and proximal major branch vessels of the neck. IMPRESSION: CT head: 1. No evidence of acute intracranial abnormality. 2. Redemonstration of multiple chronic infarcts as detailed. 3. Background mild generalized parenchymal atrophy and chronic small vessel ischemic disease. CT cervical spine: 1. No evidence of acute fracture to the cervical spine. 2. Cervical spondylosis with multilevel ossification of the posterior longitudinal ligament. Electronically Signed   By: Kellie Simmering DO   On: 12/26/2019 16:54   DG Chest Portable 1 View  Result Date: 12/26/2019 CLINICAL DATA:  Altered mental status EXAM: PORTABLE CHEST 1 VIEW COMPARISON:  06/27/2019, 01/22/2019 FINDINGS: Low lung volumes. Mild cardiomegaly with aortic atherosclerosis. Mild opacity at the cardiac apex. No pneumothorax. IMPRESSION: Low lung volumes with mild cardiomegaly. Opacity at the left cardiac apex, could be secondary to prominent epicardial fat though atelectasis or small focus airspace disease/pneumonia could also be considered. Electronically Signed   By: Donavan Foil M.D.   On: 12/26/2019 15:32   EEG adult  Result Date: 12/27/2019 Lora Havens, MD     12/27/2019  4:51 PM Patient Name: [name] MRN: [id number] Epilepsy Attending: Lora Havens Referring Physician/Provider: Dr Jenetta Downer Date: 12/27/2019 Duration: 23.49 mins Patient history: 84 year old female with chronic CVAs now presented with altered mental status.  EEG evaluate for seizures. Level of alertness:awake AEDs during EEG study: Lorazepam Technical aspects: This EEG study was done with scalp electrodes positioned according to the 10-20 International system of electrode placement. Electrical activity was acquired at a sampling rate of 500Hz  and reviewed with a high frequency filter of 70Hz  and a low  frequency filter of 1Hz . EEG data were recorded continuously and digitally stored. Description: No clear posterior dominant rhythm was seen. EEG showed continuous generalized 3-6Hz  theta-delta slowing. Hyperventilation and photic stimulation were not performed. Of note, study was technically difficult as patient was constantly moving and pulling electrodes. Abnormality - Continuous slow, generalized IMPRESSION: This technically difficult study is suggestive of moderate diffuse encephalopathy. No seizures or epileptiform discharges were seen throughout the recording. Priyanka Barbra Sarks   CT HEAD CODE STROKE WO CONTRAST  Result Date: 12/26/2019 CLINICAL DATA:  Neuro deficit, acute, stroke suspected. Additional history provided: Patient found face down on ground at [place] prior to arrival. EXAM: CT HEAD WITHOUT CONTRAST CT CERVICAL SPINE WITHOUT CONTRAST TECHNIQUE: Multidetector CT imaging of the head and cervical spine was performed following the standard protocol without intravenous contrast. Multiplanar CT image reconstructions of the cervical spine were also generated. COMPARISON:  Head CT 05/14/2018, brain MRI 05/15/2018. FINDINGS: CT HEAD FINDINGS Brain: There is no evidence of acute intracranial hemorrhage. Redemonstrated chronic left occipital lobe cortically based infarct. No new demarcated cortical infarction is identified. Redemonstrated chronic lacunar infarct within the deep right frontal lobe white matter. Additional mild ill-defined hypoattenuation within the cerebral white matter is nonspecific,  but consistent with chronic small vessel ischemic disease. Mild generalized parenchymal atrophy. Redemonstrated chronic infarcts within the left greater than right cerebellar hemispheres. No evidence of intracranial mass. No midline shift or extra-axial fluid collection. Vascular: No hyperdense vessel.  Atherosclerotic calcifications. Skull: Normal. Negative for fracture or focal lesion. Unchanged multifocal  calvarial thickening. Sinuses/Orbits: Visualized orbits demonstrate no acute abnormality. No significant paranasal sinus disease or mastoid effusion. These results were called by telephone at the time of interpretation on 12/26/2019 at 4:47 pm to provider Dr. Sabra Heck, who verbally acknowledged these results. CT CERVICAL SPINE FINDINGS Alignment: Mild leftward rotation of C1 relative to C2 may be related to patient head position at the time of examination. A cervical dextrocurvature may also be positional. Reversal of the expected cervical lordosis. No significant spondylolisthesis. Skull base and vertebrae: The basion-dental and atlanto-dental intervals are maintained.No evidence of acute fracture to the cervical spine. Soft tissues and spinal canal: No prevertebral fluid or swelling. No visible canal hematoma. Disc levels: Cervical spondylosis with multilevel disc height loss, posterior disc osteophytes, uncovertebral and facet hypertrophy. There is also multilevel ossification of the posterior longitudinal ligament. Prominent ventrolateral osteophyte at C6-C7. Upper chest: No consolidation within the imaged lung apices. Calcified atherosclerotic plaque within the visualized aortic arch and proximal major branch vessels of the neck. IMPRESSION: CT head: 1. No evidence of acute intracranial abnormality. 2. Redemonstration of multiple chronic infarcts as detailed. 3. Background mild generalized parenchymal atrophy and chronic small vessel ischemic disease. CT cervical spine: 1. No evidence of acute fracture to the cervical spine. 2. Cervical spondylosis with multilevel ossification of the posterior longitudinal ligament. Electronically Signed   By: Kellie Simmering DO   On: 12/26/2019 16:54        Scheduled Meds: . chlorhexidine  15 mL Mouth Rinse BID  . heparin  5,000 Units Subcutaneous Q8H  . insulin aspart  0-9 Units Subcutaneous Q4H  . mouth rinse  15 mL Mouth Rinse BID   Continuous Infusions: . lactated  ringers 75 mL/hr at 12/28/19 0547  . levETIRAcetam 500 mg (12/28/19 0946)  . piperacillin-tazobactam (ZOSYN)  IV 3.375 g (12/28/19 0609)     LOS: 2 days    Time spent: 30 minutes    Emaley Applin Darleen Crocker, DO [facility]  If 7PM-7AM, please contact night-coverage [email or web address] 12/28/2019, 11:42 AM

## 2019-12-28 NOTE — Plan of Care (Signed)

## 2019-12-28 NOTE — Progress Notes (Signed)
ANTICOAGULATION CONSULT NOTE -  Pharmacy Consult for heparin Indication: atrial fibrillation  Allergies  Allergen Reactions  . Yellow Jacket Venom [Bee Venom] Shortness Of Breath and Itching    Patient Measurements: Height: 5\' 4"  (162.6 cm) Weight: 179 lb 10.8 oz (81.5 kg) IBW/kg (Calculated) : 54.7 Heparin Dosing Weight: 72 kg  Vital Signs: Temp: 98 F (36.7 C) (01/16 1310) Temp Source: Oral (01/16 1310) BP: 167/90 (01/16 1310) Pulse Rate: 83 (01/16 1310)  Labs: Recent Labs    12/26/19 1440 12/26/19 1440 12/27/19 0630 12/27/19 2301 12/28/19 0937 12/28/19 1255 12/28/19 2058  HGB 12.5   < >  --  12.0  --  11.8*  --   HCT 41.7  --   --  40.3  --  39.3  --   PLT 266  --   --  250  --  236  --   APTT 27  --   --   --   --   --  151*  LABPROT 15.9*  --   --   --   --   --   --   INR 1.3*  --   --   --   --   --   --   HEPARINUNFRC  --   --   --   --   --   --  0.82*  CREATININE 1.56*  --  1.26*  --  1.35*  --   --   CKTOTAL  --   --   --  255*  --   --   --    < > = values in this interval not displayed.    Estimated Creatinine Clearance: 33.7 mL/min (A) (by C-G formula based on SCr of 1.35 mg/dL (H)).   Medical History: Past Medical History:  Diagnosis Date  . Asthma   . Bronchitis   . Carotid artery disease ([place])   . Coronary atherosclerosis of native coronary artery    Nonobstructive at cath 2005  . DM2 (diabetes mellitus, type 2) ([place])   . Essential hypertension   . Gout   . History of stroke   . Hypothyroidism   . Influenza 2013  . Nonischemic cardiomyopathy ([facility])    LVEF 20-25%  . PAF (paroxysmal atrial fibrillation) ([place])    a. diagnosed in 04/2018. Started on Eliquis for anticoagulation.   . Renal artery stenosis ([facility])    Bilateral    Medications:  Medications Prior to Admission  Medication Sig Dispense Refill Last Dose  . allopurinol (ZYLOPRIM) 100 MG tablet Take 100 mg by mouth daily.   3 12/25/2019 at Unknown time  . apixaban (ELIQUIS)  2.5 MG TABS tablet Take 1 tablet (2.5 mg total) by mouth 2 (two) times daily. 60 tablet 11 12/25/2019 at Unknown time  . atorvastatin (LIPITOR) 20 MG tablet Take 1 tablet (20 mg total) by mouth daily at 6 PM. (Patient taking differently: Take 20 mg by mouth daily at 12 noon. ) 30 tablet 6 12/25/2019 at Unknown time  . carvedilol (COREG) 12.5 MG tablet TAKE 1 TABLET(12.5 MG) BY MOUTH TWICE DAILY WITH A MEAL (Patient taking differently: Take 12.5 mg by mouth 2 (two) times daily with a meal. ) 60 tablet 6 12/25/2019 at Unknown time  . cholecalciferol (VITAMIN D) 25 MCG (1000 UT) tablet Take 1,000 Units by mouth 2 (two) times daily.   12/25/2019 at Unknown time  . donepezil (ARICEPT) 10 MG tablet Take 10 mg by mouth daily.   12/25/2019 at  Unknown time  . fluticasone (FLONASE) 50 MCG/ACT nasal spray Place 1 spray into both nostrils daily as needed for allergies or rhinitis.    unknown  . furosemide (LASIX) 40 MG tablet Take 80 mg by mouth daily.   12/25/2019 at Unknown time  . gabapentin (NEURONTIN) 100 MG capsule Take 100 mg by mouth 3 (three) times daily.    12/25/2019 at Unknown time  . hydrALAZINE (APRESOLINE) 50 MG tablet Take 1.5 tablets (75 mg total) by mouth every 8 (eight) hours. 180 tablet 1 12/25/2019 at Unknown time  . isosorbide mononitrate (IMDUR) 30 MG 24 hr tablet Take 1 tablet (30 mg total) by mouth daily. 90 tablet 3 12/25/2019 at Unknown time  . OXYGEN Inhale 2 L into the lungs daily as needed (for shortness of breath).   Past Week at Unknown time  . pioglitazone (ACTOS) 15 MG tablet Take 15 mg by mouth daily with breakfast.   12 12/25/2019 at Unknown time  . sitaGLIPtin (JANUVIA) 50 MG tablet Take 50 mg by mouth daily.   12/25/2019 at Unknown time    Assessment: Pharmacy consulted to dose heparin in patient with atrial fibrillation (previously on heparin subq).  Patient is on apixaban prior to admission with last dose given 1/13 at unknown time.    Heparin level 0.82  Goal of Therapy:   Heparin level 0.3-0.7 Monitor platelets by anticoagulation protocol: Yes   Plan:  Decrease heparin infusion to 900 units/hr. Check anti-Xa level in 8 hours and daily while on heparin. Continue to monitor H&H and platelets.   Margot Ables, PharmD Clinical Pharmacist 12/28/2019 9:57 PM

## 2019-12-28 NOTE — Progress Notes (Signed)
ANTICOAGULATION CONSULT NOTE - Initial Consult  Pharmacy Consult for heparin Indication: atrial fibrillation  Allergies  Allergen Reactions  . Yellow Jacket Venom [Bee Venom] Shortness Of Breath and Itching    Patient Measurements: Height: 5\' 4"  (162.6 cm) Weight: 179 lb 10.8 oz (81.5 kg) IBW/kg (Calculated) : 54.7 Heparin Dosing Weight: 72 kg  Vital Signs: Temp: 98.6 F (37 C) (01/16 0415) Temp Source: Oral (01/16 0415) BP: 145/90 (01/16 0415) Pulse Rate: 82 (01/16 0415)  Labs: Recent Labs    12/26/19 1440 12/27/19 0630 12/27/19 2301 12/28/19 0937  HGB 12.5  --  12.0  --   HCT 41.7  --  40.3  --   PLT 266  --  250  --   APTT 27  --   --   --   LABPROT 15.9*  --   --   --   INR 1.3*  --   --   --   CREATININE 1.56* 1.26*  --  1.35*  CKTOTAL  --   --  255*  --     Estimated Creatinine Clearance: 33.7 mL/min (A) (by C-G formula based on SCr of 1.35 mg/dL (H)).   Medical History: Past Medical History:  Diagnosis Date  . Asthma   . Bronchitis   . Carotid artery disease ([place])   . Coronary atherosclerosis of native coronary artery    Nonobstructive at cath 2005  . DM2 (diabetes mellitus, type 2) ([place])   . Essential hypertension   . Gout   . History of stroke   . Hypothyroidism   . Influenza 2013  . Nonischemic cardiomyopathy ([facility])    LVEF 20-25%  . PAF (paroxysmal atrial fibrillation) ([place])    a. diagnosed in 04/2018. Started on Eliquis for anticoagulation.   . Renal artery stenosis ([facility])    Bilateral    Medications:  Medications Prior to Admission  Medication Sig Dispense Refill Last Dose  . allopurinol (ZYLOPRIM) 100 MG tablet Take 100 mg by mouth daily.   3 12/25/2019 at Unknown time  . apixaban (ELIQUIS) 2.5 MG TABS tablet Take 1 tablet (2.5 mg total) by mouth 2 (two) times daily. 60 tablet 11 12/25/2019 at Unknown time  . atorvastatin (LIPITOR) 20 MG tablet Take 1 tablet (20 mg total) by mouth daily at 6 PM. (Patient taking differently: Take 20 mg by  mouth daily at 12 noon. ) 30 tablet 6 12/25/2019 at Unknown time  . carvedilol (COREG) 12.5 MG tablet TAKE 1 TABLET(12.5 MG) BY MOUTH TWICE DAILY WITH A MEAL (Patient taking differently: Take 12.5 mg by mouth 2 (two) times daily with a meal. ) 60 tablet 6 12/25/2019 at Unknown time  . cholecalciferol (VITAMIN D) 25 MCG (1000 UT) tablet Take 1,000 Units by mouth 2 (two) times daily.   12/25/2019 at Unknown time  . donepezil (ARICEPT) 10 MG tablet Take 10 mg by mouth daily.   12/25/2019 at Unknown time  . fluticasone (FLONASE) 50 MCG/ACT nasal spray Place 1 spray into both nostrils daily as needed for allergies or rhinitis.    unknown  . furosemide (LASIX) 40 MG tablet Take 80 mg by mouth daily.   12/25/2019 at Unknown time  . gabapentin (NEURONTIN) 100 MG capsule Take 100 mg by mouth 3 (three) times daily.    12/25/2019 at Unknown time  . hydrALAZINE (APRESOLINE) 50 MG tablet Take 1.5 tablets (75 mg total) by mouth every 8 (eight) hours. 180 tablet 1 12/25/2019 at Unknown time  . isosorbide mononitrate (IMDUR) 30  MG 24 hr tablet Take 1 tablet (30 mg total) by mouth daily. 90 tablet 3 12/25/2019 at Unknown time  . OXYGEN Inhale 2 L into the lungs daily as needed (for shortness of breath).   Past Week at Unknown time  . pioglitazone (ACTOS) 15 MG tablet Take 15 mg by mouth daily with breakfast.   12 12/25/2019 at Unknown time  . sitaGLIPtin (JANUVIA) 50 MG tablet Take 50 mg by mouth daily.   12/25/2019 at Unknown time    Assessment: Pharmacy consulted to dose heparin in patient with atrial fibrillation (previously on heparin subq).  Patient is on apixaban prior to admission with last dose given 1/13 at unknown time.  Heparin level is pending to determine if aptt levels are needed for monitoring.  Goal of Therapy:  Heparin level 0.3-0.7 units/ml aPTT 66-102 seconds Monitor platelets by anticoagulation protocol: Yes   Plan:  Give 3750 units bolus x 1 Start heparin infusion at 1050 units/hr Check anti-Xa  level in 8 hours and daily while on heparin Continue to monitor H&H and platelets  Margot Ables, PharmD Clinical Pharmacist 12/28/2019 12:05 PM

## 2019-12-29 LAB — GLUCOSE, CAPILLARY
Glucose-Capillary: 137 mg/dL — ABNORMAL HIGH (ref 70–99)
Glucose-Capillary: 212 mg/dL — ABNORMAL HIGH (ref 70–99)
Glucose-Capillary: 76 mg/dL (ref 70–99)
Glucose-Capillary: 86 mg/dL (ref 70–99)
Glucose-Capillary: 93 mg/dL (ref 70–99)
Glucose-Capillary: 96 mg/dL (ref 70–99)

## 2019-12-29 LAB — URINE CULTURE: Culture: >=100000 — AB

## 2019-12-29 MED ORDER — ATORVASTATIN CALCIUM 20 MG PO TABS
20.0000 mg | ORAL_TABLET | Freq: Every day | ORAL | Status: DC
  Administered 2019-12-29 – 2019-12-30 (×2): 20 mg via ORAL
  Filled 2019-12-29 (×2): qty 1

## 2019-12-29 MED ORDER — VITAMIN D 25 MCG (1000 UNIT) PO TABS
1000.0000 [IU] | ORAL_TABLET | Freq: Two times a day (BID) | ORAL | Status: DC
  Administered 2019-12-29 – 2020-01-01 (×6): 1000 [IU] via ORAL
  Filled 2019-12-29 (×6): qty 1

## 2019-12-29 MED ORDER — HYDRALAZINE HCL 25 MG PO TABS
75.0000 mg | ORAL_TABLET | Freq: Three times a day (TID) | ORAL | Status: DC
  Administered 2019-12-29 – 2020-01-01 (×8): 75 mg via ORAL
  Filled 2019-12-29 (×8): qty 3

## 2019-12-29 MED ORDER — CARVEDILOL 12.5 MG PO TABS
12.5000 mg | ORAL_TABLET | Freq: Two times a day (BID) | ORAL | Status: DC
  Administered 2019-12-29 – 2020-01-01 (×4): 12.5 mg via ORAL
  Filled 2019-12-29 (×4): qty 1

## 2019-12-29 MED ORDER — ALLOPURINOL 100 MG PO TABS
100.0000 mg | ORAL_TABLET | Freq: Every day | ORAL | Status: DC
  Administered 2019-12-29 – 2020-01-01 (×4): 100 mg via ORAL
  Filled 2019-12-29 (×4): qty 1

## 2019-12-29 MED ORDER — ISOSORBIDE MONONITRATE ER 60 MG PO TB24
30.0000 mg | ORAL_TABLET | Freq: Every day | ORAL | Status: DC
  Administered 2019-12-29 – 2020-01-01 (×4): 30 mg via ORAL
  Filled 2019-12-29 (×4): qty 1

## 2019-12-29 MED ORDER — DONEPEZIL HCL 5 MG PO TABS
10.0000 mg | ORAL_TABLET | Freq: Every day | ORAL | Status: DC
  Administered 2019-12-29 – 2019-12-31 (×3): 10 mg via ORAL
  Filled 2019-12-29 (×3): qty 2

## 2019-12-29 MED ORDER — ENOXAPARIN SODIUM 80 MG/0.8ML ~~LOC~~ SOLN
1.0000 mg/kg | Freq: Two times a day (BID) | SUBCUTANEOUS | Status: DC
  Administered 2019-12-29 – 2019-12-30 (×3): 80 mg via SUBCUTANEOUS
  Filled 2019-12-29 (×3): qty 0.8

## 2019-12-29 NOTE — Progress Notes (Signed)
  Speech Language Pathology Treatment: Dysphagia  Patient Details Name: [name] MRN: [id number] DOB: 1936/03/11 Today's Date: 12/29/2019 Time: [id number] SLP Time Calculation (min) (ACUTE ONLY): 22 min  Assessment / Plan / Recommendation Clinical Impression  Pt seen for ongoing dysphagia intervention. SLP asked to see Pt this date due to improved alertness. She was started on a puree diet with thin liquids this AM, but not received yet. Pt with improved alertness, however continues to present with cognitive based dysphagia with reduced alertness for thin liquids and self feeding. Pt with labial spillage x1 and immediate coughing with thin liquids on 1/4 trials. Pt improved over the course of trials. Pt with prolonged oral prep with mech soft and min residuals. Improved oral control noted with NTL. Will change diet to D1/puree and NTL with SLP to see again tomorrow, ok for po medications whole or crushed as able in puree when Pt is alert and upright. Above to RN.   HPI HPI: [name] is a 84 y.o. female with medical history significant for atrial fibrillation, diabetes mellitus, hypertension, systolic and diastolic CHF, asthma, CKD 3, gout.  Patient was brought to the ED via EMS.  Patient's was found down, lying in her front yard.  Patient's neighbor called EMS. At the time of my evaluation, patient's eyes are closed, she moans and groans when hypoxia, and moves her extremities, she is status post 4mg  Ativan.  She is not answering questions. History is obtained from chart review and EDP. Patient's daughter talked to her at about 51 AM, and patient's son talked to patient about 30 minutes prior to arrival.  Per ED provider notes there was also reports of right-sided facial droop. Stat head and cervical CT-negative for acute intracranial abnormality, multiple chronic infarcts. MRI is recommended not yet pending. Hospitalist to admit for altered mental status, UTI and possible aspiration  pneumonia. Pt failed [facility] Swallow screen secondary to lethargy and inability to stay awake/alert. BSE ordered.      SLP Plan  Continue with current plan of care       Recommendations  Diet recommendations: Dysphagia 1 (puree);Nectar-thick liquid Liquids provided via: Cup;Straw Medication Administration: Whole meds with puree Supervision: Staff to assist with self feeding;Full supervision/cueing for compensatory strategies Compensations: Slow rate;Small sips/bites;Lingual sweep for clearance of pocketing;Multiple dry swallows after each bite/sip;Follow solids with liquid Postural Changes and/or Swallow Maneuvers: Seated upright 90 degrees;Upright 30-60 min after meal                Oral Care Recommendations: Oral care BID;Staff/trained caregiver to provide oral care Follow up Recommendations: Skilled Nursing facility SLP Visit Diagnosis: Dysphagia, unspecified (R13.10) Plan: Continue with current plan of care       Thank you,  Genene Churn, [place]                 Person 12/29/2019, 12:26 PM

## 2019-12-29 NOTE — Progress Notes (Signed)
[facility] night shift  The nursing staff has reported that the patient pulled out her PICC line.  There has been no significant bleeding.  She currently only has 1 peripheral IV line, which is being used to infuse heparin.  She is due to get a 4-hour infusion of Zosyn in the next hour.  I will consult pharmacy to switch heparin to SQ Lovenox for now, so we can use the peripheral line to infuse the Zosyn.  IV team will need to reevaluate for IV abscess during day shift.  Tennis Must, MD.

## 2019-12-29 NOTE — Progress Notes (Signed)
PROGRESS NOTE    [name]  [id number] DOB: [birth year] DOA: 12/26/2019 PCP: Jani Gravel, MD   Brief Narrative:  Per HPI: [name] a 84 y.o.femalewith medical history significant foratrial fibrillation, diabetes mellitus, hypertension, systolic and diastolic CHF, asthma, CKD 3, gout.Patient was brought to the ED via EMS.Patient's was found down, lying in her front yard. Patient's neighbor called EMS.  At the time of my evaluation, patient's eyes are closed, she moans and groans when hypoxia, and moves her extremities, she is status post4mg Ativan. She is not answering questions. History is obtained from chart reviewand EDP.Patient's daughter talked to her at about 79 AM, and patient's son talked to patient about 30 minutes prior to arrival. Per ED provider notes there was also reports of right-sided facial droop.  1/15: Patient was admitted with suspicion of acute metabolic encephalopathy in the setting of pneumonia and UTI. She was started on Zosyn and doxycycline for aspiration coverage. She was noted to have some facial droop and suspicion of CVA for which MRI brain and EEG work-up is currently pending.  1/16: Patient with gram-negative rods noted in urine culture and EEG with no seizure activity.  Neurology has evaluated patient and suspects that unwitnessed seizure took place especially in light of elevated prolactin level.  Started on Keppra IV twice daily.  She still remains mostly unresponsive.  1/17: Patient noted to have E. coli UTI that is pansensitive.  Continue Zosyn for now and anticipate transition to oral antibiotics by tomorrow if able to tolerate.  Continue IV Keppra.  Start dysphagia 1 diet and advance as tolerated.  PT evaluation.  Patient did have PICC line, but unfortunately pulled it last night.  Heparin drip was discontinued and she was transitioned to full dose Lovenox.  Assessment & Plan:   Active Problems:   Acute metabolic  encephalopathy   Acute encephalopathy-suspect metabolic versus postictal -EEG with no seizure activity noted -Brain MRI still pending -Appreciate neurology recommendations with initiation of Keppra IV twice daily -Continue IV antibiotics for treatment of pneumonia as well as UTI and DC IV fluid -Monitor closely -Free T4 slightly elevated at 1.13 and ABG without hypercapnia.  Ammonia level of 16 noted -Hold home gabapentin for now  Pneumonia of left lower lobe -Influenza and Covid testing negative -Continue aspiration coverage with Zosyn and consider transition to oral Augmentin by tomorrow -Repeat CBC and monitor -Antitussives and as needed breathing treatments  E. coli UTI -This is pansensitive, but will continue Zosyn for now due to aspiration pneumonia concern and will plan to transition to oral antibiotics by tomorrow if tolerating  Prolonged QTC -Avoid QT prolonging medications -Continue monitor potassium and magnesium and replete  Diabetes -SSI every 4 hours -Hold home oral agents -Hemoglobin A1c 6.6%  Atrial fibrillation -Currently rate controlled, but will resume Coreg -IV labetalol for heart rate elevations -Continue home Eliquis once able, continue full dose Lovenox for now  Hypertension-stable -Hold further IV fluid and Coreg, hydralazine, and Imdur -IV labetalol as needed  Systolic and diastolic CHF-currently euvolemic -Hold diuretics and monitor daily weights -Gentle IV fluid while n.p.o.  Asthma-stable -DuoNebs as needed for shortness of breath or wheezing  Gout -Currently without any acute flare -Resume allopurinol  DVT prophylaxis:  Full dose Lovenox Code Status:Full Family Communication:We will plan to call daughter Disposition Plan: Brain MRI pending and EEG with no seizure activity.  Patient started on Keppra IV twice daily.    Continue Zosyn and full dose Lovenox.  Advance diet as  tolerated.   Consultants:  Neurology  Procedures:  EEG 1/15  Antimicrobials:  Anti-infectives (From admission, onward)   Start     Dose/Rate Route Frequency Ordered Stop   12/27/19 1700  doxycycline (VIBRAMYCIN) 100 mg in sodium chloride 0.9 % 250 mL IVPB  Status:  Discontinued     100 mg 125 mL/hr over 120 Minutes Intravenous Every 12 hours 12/26/19 1909 12/27/19 0952   12/26/19 2215  piperacillin-tazobactam (ZOSYN) IVPB 3.375 g     3.375 g 12.5 mL/hr over 240 Minutes Intravenous Every 8 hours 12/26/19 2201     12/26/19 1715  cefTRIAXone (ROCEPHIN) 1 g in sodium chloride 0.9 % 100 mL IVPB     1 g 200 mL/hr over 30 Minutes Intravenous  Once 12/26/19 1703 12/26/19 1902   12/26/19 1715  azithromycin (ZITHROMAX) 500 mg in sodium chloride 0.9 % 250 mL IVPB  Status:  Discontinued     500 mg 250 mL/hr over 60 Minutes Intravenous Every 24 hours 12/26/19 1703 12/26/19 1909       Subjective: Patient seen and evaluated today with no new acute complaints or concerns. No acute concerns or events noted overnight.  She appears more awake and alert and appears to be tolerating sips of fluid.  Objective: Vitals:   12/29/19 0027 12/29/19 0357 12/29/19 0414 12/29/19 0420  BP: (!) 192/78  (!) 149/114 (!) 152/88  Pulse: 70  83 74  Resp: 18     Temp: 98.4 F (36.9 C)  98.4 F (36.9 C)   TempSrc: Oral  Oral   SpO2: 100%  90% 92%  Weight:  81.3 kg    Height:        Intake/Output Summary (Last 24 hours) at 12/29/2019 1028 Last data filed at 12/28/2019 2358 Gross per 24 hour  Intake 1641.03 ml  Output 1200 ml  Net 441.03 ml   Filed Weights   12/26/19 2020 12/28/19 0415 12/29/19 0357  Weight: 81.3 kg 81.5 kg 81.3 kg    Examination:  General exam: Appears calm and comfortable  Respiratory system: Clear to auscultation. Respiratory effort normal. Cardiovascular system: S1 & S2 heard, RRR. No JVD, murmurs, rubs, gallops or clicks. No pedal edema. Gastrointestinal system:  Abdomen is nondistended, soft and nontender. No organomegaly or masses felt. Normal bowel sounds heard. Central nervous system: Alert and awake Extremities: No edema Skin: No rashes, lesions or ulcers Psychiatry: Flat affect    Data Reviewed: I have personally reviewed following labs and imaging studies  CBC: Recent Labs  Lab 12/26/19 1440 12/27/19 2301 12/28/19 1255  WBC 6.5 6.9 5.5  NEUTROABS 2.8  --   --   HGB 12.5 12.0 11.8*  HCT 41.7 40.3 39.3  MCV 88.9 88.4 89.1  PLT 266 250 [id number]   Basic Metabolic Panel: Recent Labs  Lab 12/26/19 1440 12/27/19 0630 12/28/19 0937  NA 141 143 140  K 3.5 4.4 4.1  CL 106 110 107  CO2 22 19* 20*  GLUCOSE 218* 86 118*  BUN 16 12 9   CREATININE 1.56* 1.26* 1.35*  CALCIUM 9.3 9.2 9.1  MG 1.9  --   --    GFR: Estimated Creatinine Clearance: 33.7 mL/min (A) (by C-G formula based on SCr of 1.35 mg/dL (H)). Liver Function Tests: Recent Labs  Lab 12/26/19 1440  AST 16  ALT 8  ALKPHOS 98  BILITOT 0.4  PROT 7.6  ALBUMIN 3.7   No results for input(s): LIPASE, AMYLASE in the last 168 hours. Recent Labs  Lab 12/26/19  1642 12/28/19 1255  AMMONIA 34 16   Coagulation Profile: Recent Labs  Lab 12/26/19 1440  INR 1.3*   Cardiac Enzymes: Recent Labs  Lab 12/27/19 2301  CKTOTAL 255*   BNP (last 3 results) No results for input(s): PROBNP in the last 8760 hours. HbA1C: No results for input(s): HGBA1C in the last 72 hours. CBG: Recent Labs  Lab 12/28/19 1607 12/28/19 2019 12/29/19 0025 12/29/19 0413 12/29/19 0723  GLUCAP 79 91 93 86 96   Lipid Profile: Recent Labs    12/27/19 1141  CHOL 194  HDL 39*  LDLCALC 126*  TRIG 143  CHOLHDL 5.0   Thyroid Function Tests: Recent Labs    12/26/19 1440 12/28/19 1255  TSH 5.282*  --   FREET4  --  1.13*   Anemia Panel: No results for input(s): VITAMINB12, FOLATE, FERRITIN, TIBC, IRON, RETICCTPCT in the last 72 hours. Sepsis Labs: No results for input(s): PROCALCITON,  LATICACIDVEN in the last 168 hours.  Recent Results (from the past 240 hour(s))  Respiratory Panel by RT PCR (Flu A&B, Covid) - Nasopharyngeal Swab     Status: None   Collection Time: 12/26/19  4:19 PM   Specimen: Nasopharyngeal Swab  Result Value Ref Range Status   SARS Coronavirus 2 by RT PCR NEGATIVE NEGATIVE Final    Comment: (NOTE) SARS-CoV-2 target nucleic acids are NOT DETECTED. The SARS-CoV-2 RNA is generally detectable in upper respiratoy specimens during the acute phase of infection. The lowest concentration of SARS-CoV-2 viral copies this assay can detect is 131 copies/mL. A negative result does not preclude SARS-Cov-2 infection and should not be used as the sole basis for treatment or other patient management decisions. A negative result may occur with  improper specimen collection/handling, submission of specimen other than nasopharyngeal swab, presence of viral mutation(s) within the areas targeted by this assay, and inadequate number of viral copies (<131 copies/mL). A negative result must be combined with clinical observations, patient history, and epidemiological information. The expected result is Negative. Fact Sheet for Patients:  PinkCheek.be Fact Sheet for Healthcare Providers:  GravelBags.it This test is not yet ap proved or cleared by the [place] FDA and  has been authorized for detection and/or diagnosis of SARS-CoV-2 by FDA under an Emergency Use Authorization (EUA). This EUA will remain  in effect (meaning this test can be used) for the duration of the COVID-19 declaration under Section 564(b)(1) of the Act, 21 U.S.C. section 360bbb-3(b)(1), unless the authorization is terminated or revoked sooner.    Influenza A by PCR NEGATIVE NEGATIVE Final   Influenza B by PCR NEGATIVE NEGATIVE Final    Comment: (NOTE) The Xpert Xpress SARS-CoV-2/FLU/RSV assay is intended as an aid in  the diagnosis of  influenza from Nasopharyngeal swab specimens and  should not be used as a sole basis for treatment. Nasal washings and  aspirates are unacceptable for Xpert Xpress SARS-CoV-2/FLU/RSV  testing. Fact Sheet for Patients: PinkCheek.be Fact Sheet for Healthcare Providers: GravelBags.it This test is not yet approved or cleared by the [place] FDA and  has been authorized for detection and/or diagnosis of SARS-CoV-2 by  FDA under an Emergency Use Authorization (EUA). This EUA will remain  in effect (meaning this test can be used) for the duration of the  Covid-19 declaration under Section 564(b)(1) of the Act, 21  U.S.C. section 360bbb-3(b)(1), unless the authorization is  terminated or revoked. Performed at [facility], [address], [place],  [address]   Urine culture  Status: Abnormal   Collection Time: 12/26/19  7:11 PM   Specimen: Urine, Clean Catch  Result Value Ref Range Status   Specimen Description   Final    URINE, CLEAN CATCH Performed at [facility], [address], [place], [place] [address]    Special Requests   Final    NONE Performed at [facility], [address] [place], [place] [address]    Culture >=100,000 COLONIES/mL ESCHERICHIA COLI (A)  Final   Report Status 12/29/2019 FINAL  Final   Organism ID, Bacteria ESCHERICHIA COLI (A)  Final      Susceptibility   Escherichia coli - MIC*    AMPICILLIN 4 SENSITIVE Sensitive     CEFAZOLIN <=4 SENSITIVE Sensitive     CEFTRIAXONE <=0.25 SENSITIVE Sensitive     CIPROFLOXACIN <=0.25 SENSITIVE Sensitive     GENTAMICIN <=1 SENSITIVE Sensitive     IMIPENEM <=0.25 SENSITIVE Sensitive     NITROFURANTOIN <=16 SENSITIVE Sensitive     TRIMETH/SULFA <=20 SENSITIVE Sensitive     AMPICILLIN/SULBACTAM <=2 SENSITIVE Sensitive     PIP/TAZO <=4 SENSITIVE Sensitive     * >=100,000 COLONIES/mL ESCHERICHIA COLI         Radiology Studies: DG Chest 1  View  Result Date: 12/28/2019 CLINICAL DATA:  Right PICC line placement EXAM: CHEST  1 VIEW COMPARISON:  Radiograph 12/26/2019, CT 12/26/2019 FINDINGS: Right upper extremity PICC terminates at superior cavoatrial junction. The aorta is calcified. The remaining cardiomediastinal contours are unremarkable. There is worsening opacity which appears largely confined to the right upper lobe. Some mild increase in the left upper lobe opacity is noted as well. Persistent hazy bibasilar opacities including density towards the left cardiac apex. None no pneumothorax. Slight obscuration of the left hemidiaphragm could reflect consolidation along the lung base or small volume pleural fluid. No acute osseous or soft tissue abnormality. Degenerative changes are present in the imaged spine and shoulders. Telemetry leads overlie the chest. IMPRESSION: 1. Right upper extremity PICC terminates at superior cavoatrial junction. 2. Worsening bilateral lung opacities, most notably in the right upper lobe. Electronically Signed   By: Lovena Le M.D.   On: 12/28/2019 19:01   EEG adult  Result Date: 12/27/2019 Lora Havens, MD     12/27/2019  4:51 PM Patient Name: [name] MRN: [id number] Epilepsy Attending: Lora Havens Referring Physician/Provider: Dr Jenetta Downer Date: 12/27/2019 Duration: 23.49 mins Patient history: 84 year old female with chronic CVAs now presented with altered mental status.  EEG evaluate for seizures. Level of alertness:awake AEDs during EEG study: Lorazepam Technical aspects: This EEG study was done with scalp electrodes positioned according to the 10-20 International system of electrode placement. Electrical activity was acquired at a sampling rate of 500Hz  and reviewed with a high frequency filter of 70Hz  and a low frequency filter of 1Hz . EEG data were recorded continuously and digitally stored. Description: No clear posterior dominant rhythm was seen. EEG showed continuous generalized  3-6Hz  theta-delta slowing. Hyperventilation and photic stimulation were not performed. Of note, study was technically difficult as patient was constantly moving and pulling electrodes. Abnormality - Continuous slow, generalized IMPRESSION: This technically difficult study is suggestive of moderate diffuse encephalopathy. No seizures or epileptiform discharges were seen throughout the recording. Priyanka O Yadav   [place] EKG SITE RITE  Result Date: 12/28/2019 If Site Rite image not attached, placement could not be confirmed due to current cardiac rhythm.       Scheduled Meds: . chlorhexidine  15 mL Mouth  Rinse BID  . Chlorhexidine Gluconate Cloth  6 each Topical Daily  . enoxaparin (LOVENOX) injection  1 mg/kg Subcutaneous Q12H  . insulin aspart  0-9 Units Subcutaneous Q4H  . mouth rinse  15 mL Mouth Rinse BID  . sodium chloride flush  10-40 mL Intracatheter Q12H   Continuous Infusions: . lactated ringers 75 mL/hr at 12/29/19 0604  . levETIRAcetam 500 mg (12/29/19 1008)  . piperacillin-tazobactam (ZOSYN)  IV 3.375 g (12/29/19 0605)     LOS: 3 days    Time spent: 30 minutes    Deshanae Lindo Darleen Crocker, DO [facility] Pager [phone]  If 7PM-7AM, please contact night-coverage [email or web address] Password [facility] 12/29/2019, 10:28 AM

## 2019-12-29 NOTE — Progress Notes (Signed)
ANTICOAGULATION CONSULT NOTE - Follow Up Consult  Pharmacy Consult for lovenox Indication: atrial fibrillation  Allergies  Allergen Reactions  . [place] Venom [Bee Venom] Shortness Of Breath and Itching    Patient Measurements: Height: 5\' 4"  (162.6 cm) Weight: 179 lb 3.7 oz (81.3 kg) IBW/kg (Calculated) : 54.7  Vital Signs: Temp: 98.4 F (36.9 C) (01/17 0414) Temp Source: Oral (01/17 0414) BP: 152/88 (01/17 0420) Pulse Rate: 74 (01/17 0420)  Labs: Recent Labs    12/26/19 1440 12/26/19 1440 12/27/19 0630 12/27/19 2301 12/28/19 0937 12/28/19 1255 12/28/19 2058  HGB 12.5   < >  --  12.0  --  11.8*  --   HCT 41.7  --   --  40.3  --  39.3  --   PLT 266  --   --  250  --  236  --   APTT 27  --   --   --   --   --  151*  LABPROT 15.9*  --   --   --   --   --   --   INR 1.3*  --   --   --   --   --   --   HEPARINUNFRC  --   --   --   --   --   --  0.82*  CREATININE 1.56*  --  1.26*  --  1.35*  --   --   CKTOTAL  --   --   --  255*  --   --   --    < > = values in this interval not displayed.    Estimated Creatinine Clearance: 33.7 mL/min (A) (by C-G formula based on SCr of 1.35 mg/dL (H)).   Medications:  Scheduled:  . chlorhexidine  15 mL Mouth Rinse BID  . Chlorhexidine Gluconate Cloth  6 each Topical Daily  . enoxaparin (LOVENOX) injection  1 mg/kg Subcutaneous Q12H  . insulin aspart  0-9 Units Subcutaneous Q4H  . mouth rinse  15 mL Mouth Rinse BID  . sodium chloride flush  10-40 mL Intracatheter Q12H    Assessment: Pt receiving heparin gtt for atrial fibrillation.  Patient did have PICC in place, however pulled it out leaving 1 PIV access.  Pharmacy has been asked to change from heparin gtt to SQ Lovenox to allow for Zosyn to be given through PIV access.    Goal of Therapy:  Anti-Xa level 0.6-1 units/ml 4hrs after LMWH dose given Monitor platelets by anticoagulation protocol: Yes   Plan:  Will stop heparin gtt at 0500, wait 1 hour, then start Lovenox  SQ dosing 1mg /kg Q12 hours at 0600.  Lovenox 1mg /kg Q12 hours  [name] 12/29/2019,4:32 AM

## 2019-12-29 NOTE — Progress Notes (Signed)
Pt removed her PICC line. MD made aware. Heparin gtt infusing via peripheral IV. Pt also has continuous fluids and IV abx. MD switching pt from heparin gtt to lovenox so we can run her fluids and abx. Will continue to monitor.

## 2019-12-30 LAB — BASIC METABOLIC PANEL
Anion gap: 10 (ref 5–15)
BUN: 17 mg/dL (ref 8–23)
CO2: 24 mmol/L (ref 22–32)
Calcium: 9.1 mg/dL (ref 8.9–10.3)
Chloride: 109 mmol/L (ref 98–111)
Creatinine, Ser: 2.39 mg/dL — ABNORMAL HIGH (ref 0.44–1.00)
GFR calc Af Amer: 21 mL/min — ABNORMAL LOW (ref >60)
GFR calc non Af Amer: 18 mL/min — ABNORMAL LOW (ref >60)
Glucose, Bld: 152 mg/dL — ABNORMAL HIGH (ref 70–99)
Potassium: 3.4 mmol/L — ABNORMAL LOW (ref 3.5–5.1)
Sodium: 143 mmol/L (ref 135–145)

## 2019-12-30 LAB — CBC
HCT: 38.1 % (ref 36.0–46.0)
Hemoglobin: 11.6 g/dL — ABNORMAL LOW (ref 12.0–15.0)
MCH: 26.4 pg (ref 26.0–34.0)
MCHC: 30.4 g/dL (ref 30.0–36.0)
MCV: 86.6 fL (ref 80.0–100.0)
Platelets: 225 10*3/uL (ref 150–400)
RBC: 4.4 MIL/uL (ref 3.87–5.11)
RDW: 13.6 % (ref 11.5–15.5)
WBC: 6.6 10*3/uL (ref 4.0–10.5)
nRBC: 0 % (ref 0.0–0.2)

## 2019-12-30 LAB — GLUCOSE, CAPILLARY
Glucose-Capillary: 86 mg/dL (ref 70–99)
Glucose-Capillary: 146 mg/dL — ABNORMAL HIGH (ref 70–99)
Glucose-Capillary: 134 mg/dL — ABNORMAL HIGH (ref 70–99)
Glucose-Capillary: 166 mg/dL — ABNORMAL HIGH (ref 70–99)
Glucose-Capillary: 117 mg/dL — ABNORMAL HIGH (ref 70–99)
Glucose-Capillary: 142 mg/dL — ABNORMAL HIGH (ref 70–99)

## 2019-12-30 MED ORDER — PIPERACILLIN-TAZOBACTAM 3.375 G IVPB
3.3750 g | Freq: Two times a day (BID) | INTRAVENOUS | Status: DC
  Administered 2019-12-30 – 2019-12-31 (×2): 3.375 g via INTRAVENOUS
  Filled 2019-12-30 (×2): qty 50

## 2019-12-30 MED ORDER — ENOXAPARIN SODIUM 80 MG/0.8ML ~~LOC~~ SOLN
80.0000 mg | SUBCUTANEOUS | Status: DC
  Administered 2019-12-31: 80 mg via SUBCUTANEOUS
  Filled 2019-12-30: qty 0.8

## 2019-12-30 MED ORDER — POTASSIUM CHLORIDE IN NACL 20-0.45 MEQ/L-% IV SOLN: INTRAVENOUS | Status: DC

## 2019-12-30 NOTE — Progress Notes (Signed)
PROGRESS NOTE    [name]  [id number] DOB: [birth year] DOA: 12/26/2019 PCP: Jani Gravel, MD   Brief Narrative:  Per HPI: [name] a 84 y.o.femalewith medical history significant foratrial fibrillation, diabetes mellitus, hypertension, systolic and diastolic CHF, asthma, CKD 3, gout.Patient was brought to the ED via EMS.Patient's was found down, lying in her front yard. Patient's neighbor called EMS.  At the time of my evaluation, patient's eyes are closed, she moans and groans when hypoxia, and moves her extremities, she is status post4mg Ativan. She is not answering questions. History is obtained from chart reviewand EDP.Patient's daughter talked to her at about 71 AM, and patient's son talked to patient about 30 minutes prior to arrival. Per ED provider notes there was also reports of right-sided facial droop.  1/15: Patient was admitted with suspicion of acute metabolic encephalopathy in the setting of pneumonia and UTI. She was started on Zosyn and doxycycline for aspiration coverage. She was noted to have some facial droop and suspicion of CVA for which MRI brain and EEG work-up is currently pending.  1/16:Patient with gram-negative rods noted in urine culture and EEG with no seizure activity. Neurology has evaluated patient and suspects that unwitnessed seizure took place especially in light of elevated prolactin level. Started on Keppra IV twice daily. She still remains mostly unresponsive.  1/17: Patient noted to have E. coli UTI that is pansensitive.  Continue Zosyn for now and anticipate transition to oral antibiotics by tomorrow if able to tolerate.  Continue IV Keppra.  Start dysphagia 1 diet and advance as tolerated.  PT evaluation.  Patient did have PICC line, but unfortunately pulled it last night.  Heparin drip was discontinued and she was transitioned to full dose Lovenox.  1/18: Continue Zosyn for now along with IV Keppra and full dose  Lovenox.  SLP to reevaluate to see if diet can be further advanced.  In the meantime, she is developing worsening kidney function for which I will administer IV fluid.  PT evaluation pending.  Assessment & Plan:   Active Problems:   Acute metabolic encephalopathy   Acute encephalopathy-suspect metabolicversus postictal -EEG with no seizure activity noted -Brain MRI still pending -Appreciate neurology recommendations with initiation of Keppra IV twice daily -Continue IV antibiotics for treatment of pneumonia as well as UTI and DC IV fluid -Monitor closely -Free T4 slightly elevated at 1.13 and ABG without hypercapnia.  Ammonia level of 16 noted -Hold home gabapentin for now  Pneumonia of left lower lobe -Influenza and Covid testing negative -Continue aspiration coverage with Zosyn and consider transition to oral Augmentin once tolerating diet -Repeat CBC and monitor -Antitussives and as needed breathing treatments  E. coli UTI -This is pansensitive, but will continue Zosyn for now due to aspiration pneumonia concern and will plan to transition to oral antibiotics by tomorrow if tolerating  AKI on CKD stage IIIa -Likely prerenal due to poor oral intake and will give IV fluid -Avoid nephrotoxic agents -Repeat renal panel in a.m.  Prolonged QTC -Avoid QT prolonging medications -Continue monitor potassium and magnesium and replete  Diabetes -SSI every 4 hours -Hold home oral agents -Hemoglobin A1c 6.6%  Atrial fibrillation -Currently rate controlled, but will resume Coreg -IV labetalol for heart rate elevations -Continue home Eliquis once able, continue full dose Lovenox for now  Hypertension-stable -Continue current antihypertensives -IV labetalol as needed  Systolic and diastolic CHF-currently euvolemic -Hold diuretics and monitor daily weights -Gentle IV fluid while n.p.o.  Asthma-stable -DuoNebs as needed  for shortness of breath or  wheezing  Gout -Currently without any acute flare -Resume allopurinol  Mild hypokalemia -Replete and reevaluate in a.m. along with magnesium  DVT prophylaxis: Full dose Lovenox Code Status:Full Family Communication:We will plan to call daughter Disposition Plan:Brain MRI pending and EEG with no seizure activity. Patient started on Keppra IV twice daily.   Continue Zosyn and full dose Lovenox.  Advance diet as tolerated.  PT evaluation pending.  Continue on IV fluid and recheck labs in a.m.   Consultants:  Neurology  Procedures:  EEG 1/15  Antimicrobials:  Anti-infectives (From admission, onward)   Start     Dose/Rate Route Frequency Ordered Stop   12/30/19 1758  piperacillin-tazobactam (ZOSYN) IVPB 3.375 g     3.375 g 12.5 mL/hr over 240 Minutes Intravenous Every 12 hours 12/30/19 0755     12/27/19 1700  doxycycline (VIBRAMYCIN) 100 mg in sodium chloride 0.9 % 250 mL IVPB  Status:  Discontinued     100 mg 125 mL/hr over 120 Minutes Intravenous Every 12 hours 12/26/19 1909 12/27/19 0952   12/26/19 2215  piperacillin-tazobactam (ZOSYN) IVPB 3.375 g  Status:  Discontinued     3.375 g 12.5 mL/hr over 240 Minutes Intravenous Every 8 hours 12/26/19 2201 12/30/19 0755   12/26/19 1715  cefTRIAXone (ROCEPHIN) 1 g in sodium chloride 0.9 % 100 mL IVPB     1 g 200 mL/hr over 30 Minutes Intravenous  Once 12/26/19 1703 12/26/19 1902   12/26/19 1715  azithromycin (ZITHROMAX) 500 mg in sodium chloride 0.9 % 250 mL IVPB  Status:  Discontinued     500 mg 250 mL/hr over 60 Minutes Intravenous Every 24 hours 12/26/19 1703 12/26/19 1909       Subjective: Patient seen and evaluated today with no new acute complaints or concerns. No acute concerns or events noted overnight.  She appears more awake and alert this morning.  SLP plans to reevaluate today.  PT evaluation pending.  Objective: Vitals:   12/29/19 1720 12/29/19 2100 12/30/19 0500 12/30/19 0820  BP: (!) 153/80  114/78 (!) 155/76 117/64  Pulse: 93 95 78 74  Resp:  16 16 20   Temp:  99.2 F (37.3 C) 98.6 F (37 C) 97.8 F (36.6 C)  TempSrc:  Oral Oral Oral  SpO2:  95% 94% 97%  Weight:      Height:        Intake/Output Summary (Last 24 hours) at 12/30/2019 1112 Last data filed at 12/30/2019 0900 Gross per 24 hour  Intake 240 ml  Output 500 ml  Net -260 ml   Filed Weights   12/26/19 2020 12/28/19 0415 12/29/19 0357  Weight: 81.3 kg 81.5 kg 81.3 kg    Examination:  General exam: Appears calm and comfortable  Respiratory system: Clear to auscultation. Respiratory effort normal. Cardiovascular system: S1 & S2 heard, RRR. No JVD, murmurs, rubs, gallops or clicks. No pedal edema. Gastrointestinal system: Abdomen is nondistended, soft and nontender. No organomegaly or masses felt. Normal bowel sounds heard. Central nervous system: Alert and awake Extremities: Symmetric 5 x 5 power. Skin: No rashes, lesions or ulcers Psychiatry: Difficult to assess.    Data Reviewed: I have personally reviewed following labs and imaging studies  CBC: Recent Labs  Lab 12/26/19 1440 12/27/19 2301 12/28/19 1255 12/30/19 0536  WBC 6.5 6.9 5.5 6.6  NEUTROABS 2.8  --   --   --   HGB 12.5 12.0 11.8* 11.6*  HCT 41.7 40.3 39.3 38.1  MCV 88.9  88.4 89.1 86.6  PLT 266 250 236 [id number]   Basic Metabolic Panel: Recent Labs  Lab 12/26/19 1440 12/27/19 0630 12/28/19 0937 12/30/19 0536  NA 141 143 140 143  K 3.5 4.4 4.1 3.4*  CL 106 110 107 109  CO2 22 19* 20* 24  GLUCOSE 218* 86 118* 152*  BUN 16 12 9 17   CREATININE 1.56* 1.26* 1.35* 2.39*  CALCIUM 9.3 9.2 9.1 9.1  MG 1.9  --   --   --    GFR: Estimated Creatinine Clearance: 19 mL/min (A) (by C-G formula based on SCr of 2.39 mg/dL (H)). Liver Function Tests: Recent Labs  Lab 12/26/19 1440  AST 16  ALT 8  ALKPHOS 98  BILITOT 0.4  PROT 7.6  ALBUMIN 3.7   No results for input(s): LIPASE, AMYLASE in the last 168 hours. Recent Labs  Lab  12/26/19 1642 12/28/19 1255  AMMONIA 34 16   Coagulation Profile: Recent Labs  Lab 12/26/19 1440  INR 1.3*   Cardiac Enzymes: Recent Labs  Lab 12/27/19 2301  CKTOTAL 255*   BNP (last 3 results) No results for input(s): PROBNP in the last 8760 hours. HbA1C: No results for input(s): HGBA1C in the last 72 hours. CBG: Recent Labs  Lab 12/29/19 2058 12/29/19 2345 12/30/19 0401 12/30/19 0733 12/30/19 1110  GLUCAP 212* 137* 146* 134* 166*   Lipid Profile: Recent Labs    12/27/19 1141  CHOL 194  HDL 39*  LDLCALC 126*  TRIG 143  CHOLHDL 5.0   Thyroid Function Tests: Recent Labs    12/28/19 1255  FREET4 1.13*   Anemia Panel: No results for input(s): VITAMINB12, FOLATE, FERRITIN, TIBC, IRON, RETICCTPCT in the last 72 hours. Sepsis Labs: No results for input(s): PROCALCITON, LATICACIDVEN in the last 168 hours.  Recent Results (from the past 240 hour(s))  Respiratory Panel by RT PCR (Flu A&B, Covid) - Nasopharyngeal Swab     Status: None   Collection Time: 12/26/19  4:19 PM   Specimen: Nasopharyngeal Swab  Result Value Ref Range Status   SARS Coronavirus 2 by RT PCR NEGATIVE NEGATIVE Final    Comment: (NOTE) SARS-CoV-2 target nucleic acids are NOT DETECTED. The SARS-CoV-2 RNA is generally detectable in upper respiratoy specimens during the acute phase of infection. The lowest concentration of SARS-CoV-2 viral copies this assay can detect is 131 copies/mL. A negative result does not preclude SARS-Cov-2 infection and should not be used as the sole basis for treatment or other patient management decisions. A negative result may occur with  improper specimen collection/handling, submission of specimen other than nasopharyngeal swab, presence of viral mutation(s) within the areas targeted by this assay, and inadequate number of viral copies (<131 copies/mL). A negative result must be combined with clinical observations, patient history, and epidemiological  information. The expected result is Negative. Fact Sheet for Patients:  PinkCheek.be Fact Sheet for Healthcare Providers:  GravelBags.it This test is not yet ap proved or cleared by the [place] FDA and  has been authorized for detection and/or diagnosis of SARS-CoV-2 by FDA under an Emergency Use Authorization (EUA). This EUA will remain  in effect (meaning this test can be used) for the duration of the COVID-19 declaration under Section 564(b)(1) of the Act, 21 U.S.C. section 360bbb-3(b)(1), unless the authorization is terminated or revoked sooner.    Influenza A by PCR NEGATIVE NEGATIVE Final   Influenza B by PCR NEGATIVE NEGATIVE Final    Comment: (NOTE) The Xpert Xpress SARS-CoV-2/FLU/RSV assay is intended as  an aid in  the diagnosis of influenza from Nasopharyngeal swab specimens and  should not be used as a sole basis for treatment. Nasal washings and  aspirates are unacceptable for Xpert Xpress SARS-CoV-2/FLU/RSV  testing. Fact Sheet for Patients: PinkCheek.be Fact Sheet for Healthcare Providers: GravelBags.it This test is not yet approved or cleared by the [place] FDA and  has been authorized for detection and/or diagnosis of SARS-CoV-2 by  FDA under an Emergency Use Authorization (EUA). This EUA will remain  in effect (meaning this test can be used) for the duration of the  Covid-19 declaration under Section 564(b)(1) of the Act, 21  U.S.C. section 360bbb-3(b)(1), unless the authorization is  terminated or revoked. Performed at [facility], [address], [place], [place] [address]   Urine culture     Status: Abnormal   Collection Time: 12/26/19  7:11 PM   Specimen: Urine, Clean Catch  Result Value Ref Range Status   Specimen Description   Final    URINE, CLEAN CATCH Performed at [facility], [address] [place], [place] [address]     Special Requests   Final    NONE Performed at [facility], [address] [place], [place] [address]    Culture >=100,000 COLONIES/mL ESCHERICHIA COLI (A)  Final   Report Status 12/29/2019 FINAL  Final   Organism ID, Bacteria ESCHERICHIA COLI (A)  Final      Susceptibility   Escherichia coli - MIC*    AMPICILLIN 4 SENSITIVE Sensitive     CEFAZOLIN <=4 SENSITIVE Sensitive     CEFTRIAXONE <=0.25 SENSITIVE Sensitive     CIPROFLOXACIN <=0.25 SENSITIVE Sensitive     GENTAMICIN <=1 SENSITIVE Sensitive     IMIPENEM <=0.25 SENSITIVE Sensitive     NITROFURANTOIN <=16 SENSITIVE Sensitive     TRIMETH/SULFA <=20 SENSITIVE Sensitive     AMPICILLIN/SULBACTAM <=2 SENSITIVE Sensitive     PIP/TAZO <=4 SENSITIVE Sensitive     * >=100,000 COLONIES/mL ESCHERICHIA COLI         Radiology Studies: DG Chest 1 View  Result Date: 12/28/2019 CLINICAL DATA:  Right PICC line placement EXAM: CHEST  1 VIEW COMPARISON:  Radiograph 12/26/2019, CT 12/26/2019 FINDINGS: Right upper extremity PICC terminates at superior cavoatrial junction. The aorta is calcified. The remaining cardiomediastinal contours are unremarkable. There is worsening opacity which appears largely confined to the right upper lobe. Some mild increase in the left upper lobe opacity is noted as well. Persistent hazy bibasilar opacities including density towards the left cardiac apex. None no pneumothorax. Slight obscuration of the left hemidiaphragm could reflect consolidation along the lung base or small volume pleural fluid. No acute osseous or soft tissue abnormality. Degenerative changes are present in the imaged spine and shoulders. Telemetry leads overlie the chest. IMPRESSION: 1. Right upper extremity PICC terminates at superior cavoatrial junction. 2. Worsening bilateral lung opacities, most notably in the right upper lobe. Electronically Signed   By: Lovena Le M.D.   On: 12/28/2019 19:01   [place] EKG SITE RITE  Result Date: 12/28/2019 If  Site Rite image not attached, placement could not be confirmed due to current cardiac rhythm.       Scheduled Meds: . allopurinol  100 mg Oral Daily  . atorvastatin  20 mg Oral q1800  . carvedilol  12.5 mg Oral BID WC  . chlorhexidine  15 mL Mouth Rinse BID  . Chlorhexidine Gluconate Cloth  6 each Topical Daily  . cholecalciferol  1,000 Units Oral BID  .  donepezil  10 mg Oral Daily  . [START ON 12/31/2019] enoxaparin (LOVENOX) injection  80 mg Subcutaneous Q24H  . hydrALAZINE  75 mg Oral Q8H  . insulin aspart  0-9 Units Subcutaneous Q4H  . isosorbide mononitrate  30 mg Oral Daily  . mouth rinse  15 mL Mouth Rinse BID  . sodium chloride flush  10-40 mL Intracatheter Q12H   Continuous Infusions: . 0.45 % NaCl with KCl 20 mEq / L 100 mL/hr at 12/30/19 0841  . levETIRAcetam 500 mg (12/30/19 0915)  . piperacillin-tazobactam (ZOSYN)  IV       LOS: 4 days    Time spent: 30 minutes    Tationa Stech Darleen Crocker, DO [facility] Pager [phone]  If 7PM-7AM, please contact night-coverage [email or web address] Password [facility] 12/30/2019, 11:12 AM

## 2019-12-30 NOTE — Progress Notes (Signed)
  Speech Language Pathology Treatment: Dysphagia  Patient Details Name: [name] MRN: [id number] DOB: 20-Apr-1936 Today's Date: 12/30/2019 Time: [id number] SLP Time Calculation (min) (ACUTE ONLY): 17 min  Assessment / Plan / Recommendation Clinical Impression  Pt seen for ongoing dysphagia intervention. She continues with improved alertness and is more talkative today. She was able to self feed after set up. Pt without overt signs of aspiration with thin liquids this date and presented with prolonged oral phase with solids most likely due to edentulous status. Recommend upgrade to D3/mech soft and thin liquids and supervision/assist as needed for meals. SLP will follow x1 for diet tolerance and needs.    HPI HPI: [name] is a 84 y.o. female with medical history significant for atrial fibrillation, diabetes mellitus, hypertension, systolic and diastolic CHF, asthma, CKD 3, gout.  Patient was brought to the ED via EMS.  Patient's was found down, lying in her front yard.  Patient's neighbor called EMS. At the time of my evaluation, patient's eyes are closed, she moans and groans when hypoxia, and moves her extremities, she is status post 4mg  Ativan.  She is not answering questions. History is obtained from chart review and EDP. Patient's daughter talked to her at about 45 AM, and patient's son talked to patient about 30 minutes prior to arrival.  Per ED provider notes there was also reports of right-sided facial droop. Stat head and cervical CT-negative for acute intracranial abnormality, multiple chronic infarcts. MRI is recommended not yet pending. Hospitalist to admit for altered mental status, UTI and possible aspiration pneumonia. Pt failed [facility] Swallow screen secondary to lethargy and inability to stay awake/alert. BSE ordered.      SLP Plan  Continue with current plan of care       Recommendations  Diet recommendations: Dysphagia 3 (mechanical soft);Thin liquid Liquids provided  via: Cup;Straw Medication Administration: Whole meds with puree Supervision: Staff to assist with self feeding;Full supervision/cueing for compensatory strategies Compensations: Slow rate;Small sips/bites;Lingual sweep for clearance of pocketing;Multiple dry swallows after each bite/sip;Follow solids with liquid Postural Changes and/or Swallow Maneuvers: Seated upright 90 degrees;Upright 30-60 min after meal                Oral Care Recommendations: Oral care BID;Staff/trained caregiver to provide oral care Follow up Recommendations: None SLP Visit Diagnosis: Dysphagia, unspecified (R13.10) Plan: Continue with current plan of care       Thank you,  Genene Churn, [place]                 [place] 12/30/2019, 4:16 PM

## 2019-12-30 NOTE — Progress Notes (Signed)
ANTICOAGULATION CONSULT NOTE - Follow Up Consult  Pharmacy Consult for lovenox Indication: atrial fibrillation  Allergies  Allergen Reactions  . Yellow Jacket Venom [Bee Venom] Shortness Of Breath and Itching    Patient Measurements: Height: 5\' 4"  (162.6 cm) Weight: 179 lb 3.7 oz (81.3 kg) IBW/kg (Calculated) : 54.7  Vital Signs: Temp: 98.6 F (37 C) (01/18 0500) Temp Source: Oral (01/18 0500) BP: 155/76 (01/18 0500) Pulse Rate: 78 (01/18 0500)  Labs: Recent Labs    12/27/19 2301 12/27/19 2301 12/28/19 0937 12/28/19 1255 12/28/19 2058 12/30/19 0536  HGB 12.0   < >  --  11.8*  --  11.6*  HCT 40.3  --   --  39.3  --  38.1  PLT 250  --   --  236  --  225  APTT  --   --   --   --  151*  --   HEPARINUNFRC  --   --   --   --  0.82*  --   CREATININE  --   --  1.35*  --   --  2.39*  CKTOTAL 255*  --   --   --   --   --    < > = values in this interval not displayed.    Estimated Creatinine Clearance: 19 mL/min (A) (by C-G formula based on SCr of 2.39 mg/dL (H)).   Medications:  Scheduled:  . allopurinol  100 mg Oral Daily  . atorvastatin  20 mg Oral q1800  . carvedilol  12.5 mg Oral BID WC  . chlorhexidine  15 mL Mouth Rinse BID  . Chlorhexidine Gluconate Cloth  6 each Topical Daily  . cholecalciferol  1,000 Units Oral BID  . donepezil  10 mg Oral Daily  . enoxaparin (LOVENOX) injection  1 mg/kg Subcutaneous Q12H  . hydrALAZINE  75 mg Oral Q8H  . insulin aspart  0-9 Units Subcutaneous Q4H  . isosorbide mononitrate  30 mg Oral Daily  . mouth rinse  15 mL Mouth Rinse BID  . sodium chloride flush  10-40 mL Intracatheter Q12H    Assessment: Pt receiving heparin gtt for atrial fibrillation.  Patient did have PICC in place, however pulled it out leaving 1 PIV access.  Pharmacy has been asked to change from heparin gtt to SQ Lovenox to allow for Zosyn to be given through PIV access.   1/18 AKI, will adjust dose for Crcl 44mls/min  Goal of Therapy:  Anti-Xa level  0.6-1 units/ml 4hrs after LMWH dose given Monitor platelets by anticoagulation protocol: Yes   Plan:  Lovenox 1mg /kg(80mg ) sq q24h Monitor for S/s of bleeding F/u plan  Isac Sarna, BS [place], BCPS Clinical Pharmacist Pager [phone] 12/30/2019,7:49 AM

## 2019-12-30 NOTE — Progress Notes (Signed)
Pharmacy Antibiotic Note  [name] is a 84 y.o. female admitted on 12/26/2019 with altered mental status and possible aspiration pneumonia. Pharmacy has been consulted for Zosyn dosing. Aki, Adjust dose.   Plan:  Change Zosyn 3.375g IV q12h (4-hr infusion) Pharmacy will continue to monitor renal function, cultures and patient progress.  Height: 5\' 4"  (162.6 cm) Weight: 179 lb 3.7 oz (81.3 kg) IBW/kg (Calculated) : 54.7  Temp (24hrs), Avg:98.3 F (36.8 C), Min:97.2 F (36.2 C), Max:99.2 F (37.3 C)  Recent Labs  Lab 12/26/19 1440 12/27/19 0630 12/27/19 2301 12/28/19 0937 12/28/19 1255 12/30/19 0536  WBC 6.5  --  6.9  --  5.5 6.6  CREATININE 1.56* 1.26*  --  1.35*  --  2.39*    Estimated Creatinine Clearance: 19 mL/min (A) (by C-G formula based on SCr of 2.39 mg/dL (H)).    Allergies  Allergen Reactions  . Yellow Jacket Venom [Bee Venom] Shortness Of Breath and Itching    Antimicrobials this admission: Zosyn 1/14 >>   ceftriaxone 1/14>> x1 dose doxycycline 1/15>>    Microbiology results:  1/14 UCx:  E.Coli >100,000 CFU/ml pan sensitive 1/14 Resp Panel: SARS CoV-2 negative, Flu A/B negative   Thank you for allowing pharmacy to be a part of this patient's care. Isac Sarna, BS Pharm D, [place] Clinical Pharmacist Pager [phone] 12/30/2019 7:56 AM

## 2019-12-30 NOTE — Care Management Important Message (Signed)
Important Message  Patient Details  Name: [name] MRN: [id number] Date of Birth: 03-25-1936   Medicare Important Message Given:  Yes     Tommy Medal 12/30/2019, 1:13 PM

## 2019-12-30 NOTE — Progress Notes (Signed)
[place] A. Merlene Laughter, MD     [email or web address]          [name] is an 84 y.o. female.   Assessment/Plan: 1.  Acute encephalopathy of unclear etiology: The patient has improved spontaneously and with current treatment.  We will continue the [place] given that she may have had an unwitnessed seizure.  2.  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   3.  Evidence of pneumonia us/aspiration possibly related to unwitnessed seizure.    She has improved since admission.  She is more cooperative with evaluation.    GENERAL:   She is resting well in bed.   HEENT:  There is bruising involving the right frontal region. Neck is supple.  ABDOMEN: soft  EXTREMITIES: No edema   BACK:  Normal  SKIN: Normal by inspection.    MENTAL STATUS:   She opens eyes spontaneously and is lucid and coherent.  Follows commands.  No dysarthria is noted.  CRANIAL NERVES: Pupils are equal, round and reactive to light and accomodation; extra ocular movements are full, there is no significant nystagmus; visual fields are full; upper and lower facial muscles are normal in strength and symmetric, there is no flattening of the nasolabial folds; tongue is midline; uvula is midline; shoulder elevation is normal.  MOTOR:  She moves both sides well normal tone and strength. Bulk is also normal.  COORDINATION:  No tremors are noted. No myoclonus or negative myoclonus. No parkinsonism.  REFLEXES: Deep tendon reflexes are symmetrical and normal.   SENSATION: Normal to pain.     CK 255   Objective: Vital signs in last 24 hours: Temp:  [97.8 F (36.6 C)-99.2 F (37.3 C)] 98.5 F (36.9 C) (01/18 1620) Pulse Rate:  [71-95] 76 (01/18 1620) Resp:  [16-20] 20 (01/18 1620) BP: (96-155)/(59-78) 127/78 (01/18 1620) SpO2:  [94 %-98 %] 94 % (01/18 1620)  Intake/Output from previous day: 01/17 0701 - 01/18  0700 In: 120 [P.O.:120] Out: 500 [Urine:500] Intake/Output this shift: Total I/O In: 550 [P.O.:120; I.V.:280; IV Piggyback:150] Out: -  Nutritional status:  Diet Order            DIET DYS 3 Room service appropriate? Yes; Fluid consistency: Thin  Diet effective now               Lab Results: Results for orders placed or performed during the hospital encounter of 12/26/19 (from the past 48 hour(s))  Glucose, capillary     Status: None   Collection Time: 12/28/19  8:19 PM  Result Value Ref Range   Glucose-Capillary 91 70 - 99 mg/dL   Comment 1 Notify RN    Comment 2 Document in Chart   Heparin level (unfractionated)     Status: Abnormal   Collection Time: 12/28/19  8:58 PM  Result Value Ref Range   Heparin Unfractionated 0.82 (H) 0.30 - 0.70 IU/mL    Comment: (NOTE) If heparin results are below expected values, and patient dosage has  been confirmed, suggest follow up testing of antithrombin III levels. Performed at [facility], [address], [place], [place] [address]   APTT     Status: Abnormal   Collection Time: 12/28/19  8:58 PM  Result Value Ref Range   aPTT 151 (H) 24 - 36 seconds    Comment:        IF BASELINE aPTT IS ELEVATED, SUGGEST PATIENT RISK ASSESSMENT BE USED TO DETERMINE APPROPRIATE ANTICOAGULANT THERAPY. Performed  at [facility], [address], [place], [place] [address]   Glucose, capillary     Status: None   Collection Time: 12/29/19 12:25 AM  Result Value Ref Range   Glucose-Capillary 93 70 - 99 mg/dL   Comment 1 Notify RN    Comment 2 Document in Chart   Glucose, capillary     Status: None   Collection Time: 12/29/19  4:13 AM  Result Value Ref Range   Glucose-Capillary 86 70 - 99 mg/dL   Comment 1 Notify RN    Comment 2 Document in Chart   Glucose, capillary     Status: None   Collection Time: 12/29/19  7:23 AM  Result Value Ref Range   Glucose-Capillary 96 70 - 99 mg/dL   Comment 1 Notify RN    Comment 2 Document in Chart    Glucose, capillary     Status: None   Collection Time: 12/29/19 11:15 AM  Result Value Ref Range   Glucose-Capillary 76 70 - 99 mg/dL   Comment 1 Notify RN    Comment 2 Document in Chart   Glucose, capillary     Status: None   Collection Time: 12/29/19  4:00 PM  Result Value Ref Range   Glucose-Capillary 86 70 - 99 mg/dL   Comment 1 Notify RN    Comment 2 Document in Chart   Glucose, capillary     Status: Abnormal   Collection Time: 12/29/19  8:58 PM  Result Value Ref Range   Glucose-Capillary 212 (H) 70 - 99 mg/dL  Glucose, capillary     Status: Abnormal   Collection Time: 12/29/19 11:45 PM  Result Value Ref Range   Glucose-Capillary 137 (H) 70 - 99 mg/dL   Comment 1 Notify RN    Comment 2 Document in Chart   Glucose, capillary     Status: Abnormal   Collection Time: 12/30/19  4:01 AM  Result Value Ref Range   Glucose-Capillary 146 (H) 70 - 99 mg/dL   Comment 1 Notify RN    Comment 2 Document in Chart   CBC     Status: Abnormal   Collection Time: 12/30/19  5:36 AM  Result Value Ref Range   WBC 6.6 4.0 - 10.5 K/uL   RBC 4.40 3.87 - 5.11 MIL/uL   Hemoglobin 11.6 (L) 12.0 - 15.0 g/dL   HCT 38.1 36.0 - 46.0 %   MCV 86.6 80.0 - 100.0 fL   MCH 26.4 26.0 - 34.0 pg   MCHC 30.4 30.0 - 36.0 g/dL   RDW 13.6 11.5 - 15.5 %   Platelets 225 150 - 400 K/uL   nRBC 0.0 0.0 - 0.2 %    Comment: Performed at [facility], [address], [place], [place] [id number]  Basic metabolic panel     Status: Abnormal   Collection Time: 12/30/19  5:36 AM  Result Value Ref Range   Sodium 143 135 - 145 mmol/L   Potassium 3.4 (L) 3.5 - 5.1 mmol/L   Chloride 109 98 - 111 mmol/L   CO2 24 22 - 32 mmol/L   Glucose, Bld 152 (H) 70 - 99 mg/dL   BUN 17 8 - 23 mg/dL   Creatinine, Ser 2.39 (H) 0.44 - 1.00 mg/dL   Calcium 9.1 8.9 - 10.3 mg/dL   GFR calc non Af Amer 18 (L) >60 mL/min   GFR calc Af Amer 21 (L) >60 mL/min   Anion gap 10 5 - 15    Comment: Performed  at [facility], [address],  [place], [place] [address]  Glucose, capillary     Status: Abnormal   Collection Time: 12/30/19  7:33 AM  Result Value Ref Range   Glucose-Capillary 134 (H) 70 - 99 mg/dL  Glucose, capillary     Status: Abnormal   Collection Time: 12/30/19 11:10 AM  Result Value Ref Range   Glucose-Capillary 166 (H) 70 - 99 mg/dL  Glucose, capillary     Status: Abnormal   Collection Time: 12/30/19  4:29 PM  Result Value Ref Range   Glucose-Capillary 117 (H) 70 - 99 mg/dL    Lipid Panel No results for input(s): CHOL, TRIG, HDL, CHOLHDL, VLDL, LDLCALC in the last 72 hours.  Studies/Results:   Medications:  Scheduled Meds: . allopurinol  100 mg Oral Daily  . atorvastatin  20 mg Oral q1800  . carvedilol  12.5 mg Oral BID WC  . chlorhexidine  15 mL Mouth Rinse BID  . Chlorhexidine Gluconate Cloth  6 each Topical Daily  . cholecalciferol  1,000 Units Oral BID  . donepezil  10 mg Oral Daily  . [START ON 12/31/2019] enoxaparin (LOVENOX) injection  80 mg Subcutaneous Q24H  . hydrALAZINE  75 mg Oral Q8H  . insulin aspart  0-9 Units Subcutaneous Q4H  . isosorbide mononitrate  30 mg Oral Daily  . mouth rinse  15 mL Mouth Rinse BID  . sodium chloride flush  10-40 mL Intracatheter Q12H   Continuous Infusions: . 0.45 % NaCl with KCl 20 mEq / L 100 mL/hr at 12/30/19 0841  . levETIRAcetam 500 mg (12/30/19 0915)  . piperacillin-tazobactam (ZOSYN)  IV 3.375 g (12/30/19 1633)   PRN Meds:.acetaminophen **OR** acetaminophen, ipratropium-albuterol, labetalol, LORazepam, polyethylene glycol, sodium chloride flush     LOS: 4 days   Trissa Molina A. Merlene Laughter, M.D.  Diplomate, Tax adviser of Psychiatry and Neurology ( Neurology).

## 2019-12-31 ENCOUNTER — Inpatient Hospital Stay (HOSPITAL_COMMUNITY): Payer: Medicare Other

## 2019-12-31 LAB — BASIC METABOLIC PANEL
Anion gap: 8 (ref 5–15)
BUN: 19 mg/dL (ref 8–23)
CO2: 24 mmol/L (ref 22–32)
Calcium: 8.9 mg/dL (ref 8.9–10.3)
Chloride: 111 mmol/L (ref 98–111)
Creatinine, Ser: 2.54 mg/dL — ABNORMAL HIGH (ref 0.44–1.00)
GFR calc Af Amer: 20 mL/min — ABNORMAL LOW (ref >60)
GFR calc non Af Amer: 17 mL/min — ABNORMAL LOW (ref >60)
Glucose, Bld: 124 mg/dL — ABNORMAL HIGH (ref 70–99)
Potassium: 3.4 mmol/L — ABNORMAL LOW (ref 3.5–5.1)
Sodium: 143 mmol/L (ref 135–145)

## 2019-12-31 LAB — GLUCOSE, CAPILLARY
Glucose-Capillary: 131 mg/dL — ABNORMAL HIGH (ref 70–99)
Glucose-Capillary: 115 mg/dL — ABNORMAL HIGH (ref 70–99)
Glucose-Capillary: 109 mg/dL — ABNORMAL HIGH (ref 70–99)
Glucose-Capillary: 225 mg/dL — ABNORMAL HIGH (ref 70–99)
Glucose-Capillary: 119 mg/dL — ABNORMAL HIGH (ref 70–99)
Glucose-Capillary: 147 mg/dL — ABNORMAL HIGH (ref 70–99)
Glucose-Capillary: 123 mg/dL — ABNORMAL HIGH (ref 70–99)

## 2019-12-31 LAB — CBC
HCT: 35.2 % — ABNORMAL LOW (ref 36.0–46.0)
Hemoglobin: 10.5 g/dL — ABNORMAL LOW (ref 12.0–15.0)
MCH: 26.4 pg (ref 26.0–34.0)
MCHC: 29.8 g/dL — ABNORMAL LOW (ref 30.0–36.0)
MCV: 88.4 fL (ref 80.0–100.0)
Platelets: 213 10*3/uL (ref 150–400)
RBC: 3.98 MIL/uL (ref 3.87–5.11)
RDW: 13.8 % (ref 11.5–15.5)
WBC: 5.6 10*3/uL (ref 4.0–10.5)
nRBC: 0 % (ref 0.0–0.2)

## 2019-12-31 LAB — MAGNESIUM: Magnesium: 1.8 mg/dL (ref 1.7–2.4)

## 2019-12-31 MED ORDER — LEVETIRACETAM 500 MG PO TABS
500.0000 mg | ORAL_TABLET | Freq: Two times a day (BID) | ORAL | Status: DC
  Administered 2019-12-31 – 2020-01-01 (×3): 500 mg via ORAL
  Filled 2019-12-31 (×3): qty 1

## 2019-12-31 MED ORDER — APIXABAN 2.5 MG PO TABS
2.5000 mg | ORAL_TABLET | Freq: Two times a day (BID) | ORAL | Status: DC
  Administered 2019-12-31 – 2020-01-01 (×3): 2.5 mg via ORAL
  Filled 2019-12-31 (×3): qty 1

## 2019-12-31 MED ORDER — AMOXICILLIN-POT CLAVULANATE 500-125 MG PO TABS
1.0000 | ORAL_TABLET | Freq: Two times a day (BID) | ORAL | Status: DC
  Administered 2019-12-31 – 2020-01-01 (×3): 500 mg via ORAL
  Filled 2019-12-31 (×3): qty 1

## 2019-12-31 NOTE — Progress Notes (Signed)
PROGRESS NOTE    BREANAH ALEXANDRA  [id number] DOB: 04-23-1936 DOA: 12/26/2019 PCP: Jani Gravel, MD   Brief Narrative:  Per HPI: [name] a 84 y.o.femalewith medical history significant foratrial fibrillation, diabetes mellitus, hypertension, systolic and diastolic CHF, asthma, CKD 3, gout.Patient was brought to the ED via EMS.Patient's was found down, lying in her front yard. Patient's neighbor called EMS.  At the time of my evaluation, patient's eyes are closed, she moans and groans when hypoxia, and moves her extremities, she is status post4mg Ativan. She is not answering questions. History is obtained from chart reviewand EDP.Patient's daughter talked to her at about 59 AM, and patient's son talked to patient about 30 minutes prior to arrival. Per ED provider notes there was also reports of right-sided facial droop.  1/15: Patient was admitted with suspicion of acute metabolic encephalopathy in the setting of pneumonia and UTI. She was started on Zosyn and doxycycline for aspiration coverage. She was noted to have some facial droop and suspicion of CVA for which MRI brain and EEG work-up is currently pending.  1/16:Patient with gram-negative rods noted in urine culture and EEG with no seizure activity. Neurology has evaluated patient and suspects that unwitnessed seizure took place especially in light of elevated prolactin level. Started on Keppra IV twice daily. She still remains mostly unresponsive.  1/17:Patient noted to have E. coli UTI that is pansensitive. Continue Zosyn for now and anticipate transition to oral antibiotics by tomorrow if able to tolerate. Continue IV Keppra. Start dysphagia 1 diet and advance as tolerated. PT evaluation. Patient did have PICC line, but unfortunately pulled it last night. Heparin drip was discontinued and she was transitioned to full dose Lovenox.  1/18: Continue Zosyn for now along with IV Keppra and full dose  Lovenox.  SLP to reevaluate to see if diet can be further advanced.  In the meantime, she is developing worsening kidney function for which I will administer IV fluid.  PT evaluation pending.  1/19: Switch from IV Zosyn to oral Augmentin for coverage of aspiration pneumonia as well as continued coverage for E. coli UTI which is pansensitive.  IV Keppra switched to oral per pharmacy and full dose Lovenox discontinued and will resume home Eliquis.  Diet is currently on dysphagia 3.  Continue monitor repeat labs.  PT evaluation recommending home health PT.  Brain MRI order has been repeated today.  Assessment & Plan:   Active Problems:   Acute metabolic encephalopathy   Acute encephalopathy-suspect metabolicversus postictal -EEG with no seizure activity noted -Brain MRI reordered today and pending; couldn't have it previously due to agitation -Appreciate neurology recommendations with initiation of Keppra IV twice daily, now to PO -Continue antibiotics for UTI and PNA, now with Augmentin-transitioned from Zosyn today -Monitor closely -Free T4 slightly elevated at 1.13 and ABG without hypercapnia. Ammonia level of 16 noted -Hold home gabapentin for now  Pneumonia of left lower lobe -Influenza and Covid testing negative -Continue aspiration coverage now with Augmentin from Zosyn; transitioned on 1/19 -Repeat CBC and monitor -Antitussives and as needed breathing treatments  E. coli UTI -This is pansensitive and will plan to switch IV Zosyn to Augmentin  AKI on CKD stage IIIa -Likely prerenal due to poor oral intake and will give IV fluid -Currently with upward trend in creatinine levels and poor urine output, continue monitor a.m. labs and consider nephrology evaluation as needed -Avoid nephrotoxic agents -Repeat renal panel in a.m.  Prolonged QTC -Avoid QT prolonging medications -Continue monitor  potassium and magnesium and replete  Diabetes -SSI every 4 hours -Hold home  oral agents -Hemoglobin A1c 6.6%  Atrial fibrillation -Continue Coreg -IV labetalol for heart rate elevations -Continue home Eliquis today and hold further full dose Lovenox  Hypertension-with elevation noted -Continue current antihypertensives -IV labetalol as needed  Systolic and diastolic CHF-currently euvolemic -Hold diuretics and monitor daily weights -Gentle IV fluid while n.p.o.  Asthma-stable -DuoNebs as needed for shortness of breath or wheezing  Gout -Currently without any acute flare -Resume allopurinol  Mild hypokalemia-persistent -Replete and reevaluate in a.m. along with magnesium  DVT prophylaxis:Eliquis Code Status:Full Family Communication:Daughter updated Disposition Plan:Brain MRI pending today and EEG with no seizure activity. Continue on oral Keppra and Augmentin today.  Full dose Lovenox changed to home Eliquis.  Continue diet advancement as tolerated.  Appreciate PT evaluation for home health PT when ready for discharge hopefully in the next 24-48 hours.  Ongoing neurology evaluation appreciated.   Consultants:  Neurology  Procedures:  EEG 1/15  Antimicrobials:  Anti-infectives (From admission, onward)   Start     Dose/Rate Route Frequency Ordered Stop   12/31/19 1200  amoxicillin-clavulanate (AUGMENTIN) 500-125 MG per tablet 500 mg     1 tablet Oral Every 12 hours 12/31/19 1151     12/30/19 1758  piperacillin-tazobactam (ZOSYN) IVPB 3.375 g  Status:  Discontinued     3.375 g 12.5 mL/hr over 240 Minutes Intravenous Every 12 hours 12/30/19 0755 12/31/19 1151   12/27/19 1700  doxycycline (VIBRAMYCIN) 100 mg in sodium chloride 0.9 % 250 mL IVPB  Status:  Discontinued     100 mg 125 mL/hr over 120 Minutes Intravenous Every 12 hours 12/26/19 1909 12/27/19 0952   12/26/19 2215  piperacillin-tazobactam (ZOSYN) IVPB 3.375 g  Status:  Discontinued     3.375 g 12.5 mL/hr over 240 Minutes Intravenous Every 8 hours 12/26/19 2201  12/30/19 0755   12/26/19 1715  cefTRIAXone (ROCEPHIN) 1 g in sodium chloride 0.9 % 100 mL IVPB     1 g 200 mL/hr over 30 Minutes Intravenous  Once 12/26/19 1703 12/26/19 1902   12/26/19 1715  azithromycin (ZITHROMAX) 500 mg in sodium chloride 0.9 % 250 mL IVPB  Status:  Discontinued     500 mg 250 mL/hr over 60 Minutes Intravenous Every 24 hours 12/26/19 1703 12/26/19 1909       Subjective: Patient seen and evaluated today with no new acute complaints or concerns. No acute concerns or events noted overnight.  She is more awake and alert.  Objective: Vitals:   12/30/19 2030 12/31/19 0030 12/31/19 0439 12/31/19 0525  BP: (!) 169/77 (!) 153/67 (!) 192/77   Pulse: 71 75 75   Resp: 18 19 18    Temp: 99 F (37.2 C) 98.7 F (37.1 C) 99.2 F (37.3 C)   TempSrc: Oral Oral Oral   SpO2: 93% 97% 95%   Weight:    84.6 kg  Height:        Intake/Output Summary (Last 24 hours) at 12/31/2019 1226 Last data filed at 12/31/2019 0510 Gross per 24 hour  Intake 670 ml  Output 200 ml  Net 470 ml   Filed Weights   12/28/19 0415 12/29/19 0357 12/31/19 0525  Weight: 81.5 kg 81.3 kg 84.6 kg    Examination:  General exam: Appears calm and comfortable  Respiratory system: Clear to auscultation. Respiratory effort normal.  Currently on room air. Cardiovascular system: S1 & S2 heard, RRR. No JVD, murmurs, rubs, gallops or clicks. No  pedal edema. Gastrointestinal system: Abdomen is nondistended, soft and nontender. No organomegaly or masses felt. Normal bowel sounds heard. Central nervous system: Alert and awake. Extremities: Symmetric 5 x 5 power. Skin: No rashes, lesions or ulcers Psychiatry: Difficult to assess    Data Reviewed: I have personally reviewed following labs and imaging studies  CBC: Recent Labs  Lab 12/26/19 1440 12/27/19 2301 12/28/19 1255 12/30/19 0536 12/31/19 0422  WBC 6.5 6.9 5.5 6.6 5.6  NEUTROABS 2.8  --   --   --   --   HGB 12.5 12.0 11.8* 11.6* 10.5*  HCT  41.7 40.3 39.3 38.1 35.2*  MCV 88.9 88.4 89.1 86.6 88.4  PLT 266 250 236 225 [id number]   Basic Metabolic Panel: Recent Labs  Lab 12/26/19 1440 12/27/19 0630 12/28/19 0937 12/30/19 0536 12/31/19 0422  NA 141 143 140 143 143  K 3.5 4.4 4.1 3.4* 3.4*  CL 106 110 107 109 111  CO2 22 19* 20* 24 24  GLUCOSE 218* 86 118* 152* 124*  BUN 16 12 9 17 19   CREATININE 1.56* 1.26* 1.35* 2.39* 2.54*  CALCIUM 9.3 9.2 9.1 9.1 8.9  MG 1.9  --   --   --  1.8   GFR: Estimated Creatinine Clearance: 18.3 mL/min (A) (by C-G formula based on SCr of 2.54 mg/dL (H)). Liver Function Tests: Recent Labs  Lab 12/26/19 1440  AST 16  ALT 8  ALKPHOS 98  BILITOT 0.4  PROT 7.6  ALBUMIN 3.7   No results for input(s): LIPASE, AMYLASE in the last 168 hours. Recent Labs  Lab 12/26/19 1642 12/28/19 1255  AMMONIA 34 16   Coagulation Profile: Recent Labs  Lab 12/26/19 1440  INR 1.3*   Cardiac Enzymes: Recent Labs  Lab 12/27/19 2301  CKTOTAL 255*   BNP (last 3 results) No results for input(s): PROBNP in the last 8760 hours. HbA1C: No results for input(s): HGBA1C in the last 72 hours. CBG: Recent Labs  Lab 12/30/19 2031 12/31/19 0126 12/31/19 0403 12/31/19 0733 12/31/19 1057  GLUCAP 142* 131* 115* 109* 225*   Lipid Profile: No results for input(s): CHOL, HDL, LDLCALC, TRIG, CHOLHDL, LDLDIRECT in the last 72 hours. Thyroid Function Tests: Recent Labs    12/28/19 1255  FREET4 1.13*   Anemia Panel: No results for input(s): VITAMINB12, FOLATE, FERRITIN, TIBC, IRON, RETICCTPCT in the last 72 hours. Sepsis Labs: No results for input(s): PROCALCITON, LATICACIDVEN in the last 168 hours.  Recent Results (from the past 240 hour(s))  Respiratory Panel by RT PCR (Flu A&B, Covid) - Nasopharyngeal Swab     Status: None   Collection Time: 12/26/19  4:19 PM   Specimen: Nasopharyngeal Swab  Result Value Ref Range Status   SARS Coronavirus 2 by RT PCR NEGATIVE NEGATIVE Final    Comment:  (NOTE) SARS-CoV-2 target nucleic acids are NOT DETECTED. The SARS-CoV-2 RNA is generally detectable in upper respiratoy specimens during the acute phase of infection. The lowest concentration of SARS-CoV-2 viral copies this assay can detect is 131 copies/mL. A negative result does not preclude SARS-Cov-2 infection and should not be used as the sole basis for treatment or other patient management decisions. A negative result may occur with  improper specimen collection/handling, submission of specimen other than nasopharyngeal swab, presence of viral mutation(s) within the areas targeted by this assay, and inadequate number of viral copies (<131 copies/mL). A negative result must be combined with clinical observations, patient history, and epidemiological information. The expected result is Negative. Fact Sheet  for Patients:  PinkCheek.be Fact Sheet for Healthcare Providers:  GravelBags.it This test is not yet ap proved or cleared by the [place] FDA and  has been authorized for detection and/or diagnosis of SARS-CoV-2 by FDA under an Emergency Use Authorization (EUA). This EUA will remain  in effect (meaning this test can be used) for the duration of the COVID-19 declaration under Section 564(b)(1) of the Act, 21 U.S.C. section 360bbb-3(b)(1), unless the authorization is terminated or revoked sooner.    Influenza A by PCR NEGATIVE NEGATIVE Final   Influenza B by PCR NEGATIVE NEGATIVE Final    Comment: (NOTE) The Xpert Xpress SARS-CoV-2/FLU/RSV assay is intended as an aid in  the diagnosis of influenza from Nasopharyngeal swab specimens and  should not be used as a sole basis for treatment. Nasal washings and  aspirates are unacceptable for Xpert Xpress SARS-CoV-2/FLU/RSV  testing. Fact Sheet for Patients: PinkCheek.be Fact Sheet for Healthcare  Providers: GravelBags.it This test is not yet approved or cleared by the [place] FDA and  has been authorized for detection and/or diagnosis of SARS-CoV-2 by  FDA under an Emergency Use Authorization (EUA). This EUA will remain  in effect (meaning this test can be used) for the duration of the  Covid-19 declaration under Section 564(b)(1) of the Act, 21  U.S.C. section 360bbb-3(b)(1), unless the authorization is  terminated or revoked. Performed at [facility], [address], [place], [place] [address]   Urine culture     Status: Abnormal   Collection Time: 12/26/19  7:11 PM   Specimen: Urine, Clean Catch  Result Value Ref Range Status   Specimen Description   Final    URINE, CLEAN CATCH Performed at [facility], [address], [place], [place] [address]    Special Requests   Final    NONE Performed at [facility], [address], [place], [place] [address]    Culture >=100,000 COLONIES/mL ESCHERICHIA COLI (A)  Final   Report Status 12/29/2019 FINAL  Final   Organism ID, Bacteria ESCHERICHIA COLI (A)  Final      Susceptibility   Escherichia coli - MIC*    AMPICILLIN 4 SENSITIVE Sensitive     CEFAZOLIN <=4 SENSITIVE Sensitive     CEFTRIAXONE <=0.25 SENSITIVE Sensitive     CIPROFLOXACIN <=0.25 SENSITIVE Sensitive     GENTAMICIN <=1 SENSITIVE Sensitive     IMIPENEM <=0.25 SENSITIVE Sensitive     NITROFURANTOIN <=16 SENSITIVE Sensitive     TRIMETH/SULFA <=20 SENSITIVE Sensitive     AMPICILLIN/SULBACTAM <=2 SENSITIVE Sensitive     PIP/TAZO <=4 SENSITIVE Sensitive     * >=100,000 COLONIES/mL ESCHERICHIA COLI         Radiology Studies: No results found.      Scheduled Meds: . allopurinol  100 mg Oral Daily  . amoxicillin-clavulanate  1 tablet Oral Q12H  . apixaban  2.5 mg Oral BID  . atorvastatin  20 mg Oral q1800  . carvedilol  12.5 mg Oral BID WC  . chlorhexidine  15 mL Mouth Rinse BID  . Chlorhexidine Gluconate Cloth  6 each  Topical Daily  . cholecalciferol  1,000 Units Oral BID  . donepezil  10 mg Oral Daily  . hydrALAZINE  75 mg Oral Q8H  . insulin aspart  0-9 Units Subcutaneous Q4H  . isosorbide mononitrate  30 mg Oral Daily  . levETIRAcetam  500 mg Oral BID  . mouth rinse  15 mL Mouth Rinse BID  . sodium chloride flush  10-40  mL Intracatheter Q12H   Continuous Infusions: . 0.45 % NaCl with KCl 20 mEq / L 100 mL/hr at 12/31/19 0608     LOS: 5 days    Time spent: 30 minutes    Dannell Raczkowski Darleen Crocker, DO [facility] Pager [phone]  If 7PM-7AM, please contact night-coverage [email or web address] Password [facility] 12/31/2019, 12:26 PM

## 2019-12-31 NOTE — Plan of Care (Signed)
  Problem: Acute Rehab PT Goals(only PT should resolve) Goal: Patient Will Transfer Sit To/From Stand Outcome: Progressing Flowsheets (Taken 12/31/2019 1134) Patient will transfer sit to/from stand: with supervision Goal: Pt Will Transfer Bed To Chair/Chair To Bed Outcome: Progressing Flowsheets (Taken 12/31/2019 1134) Pt will Transfer Bed to Chair/Chair to Bed: with supervision Goal: Pt Will Perform Standing Balance Or Pre-Gait Outcome: Progressing Flowsheets (Taken 12/31/2019 1134) Pt will perform standing balance or pre-gait: with Supervision Goal: Pt Will Ambulate Outcome: Progressing Flowsheets (Taken 12/31/2019 1134) Pt will Ambulate:  50 feet  with least restrictive assistive device  with min guard assist  11:35 AM, 12/31/19 Mearl Latin PT, DPT Physical Therapist at [facility]

## 2019-12-31 NOTE — Progress Notes (Signed)
  Speech Language Pathology Treatment: Dysphagia  Patient Details Name: [name] MRN: [id number] DOB: 1936/03/17 Today's Date: 12/31/2019 Time: 2122-4825 SLP Time Calculation (min) (ACUTE ONLY): 10 min  Assessment / Plan / Recommendation Clinical Impression  Pt is tolerating mechanical soft diet textures and thin liquids per nursing staff. She was seen sitting upright in chair with straw sips of thin and soft textures. Pt with much improved alertness and likely close to baseline (conversing on the phone). SLP will sign off, continue D3/mech soft and thin liquids with standard aspiration and reflux precautions. She states that she would prefer to keep taking her medications whole in puree.     HPI HPI: [name] is a 84 y.o. female with medical history significant for atrial fibrillation, diabetes mellitus, hypertension, systolic and diastolic CHF, asthma, CKD 3, gout.  Patient was brought to the ED via EMS.  Patient's was found down, lying in her front yard.  Patient's neighbor called EMS. At the time of my evaluation, patient's eyes are closed, she moans and groans when hypoxia, and moves her extremities, she is status post '4mg'$  Ativan.  She is not answering questions. History is obtained from chart review and EDP. Patient's daughter talked to her at about 75 AM, and patient's son talked to patient about 30 minutes prior to arrival.  Per ED provider notes there was also reports of right-sided facial droop. Stat head and cervical CT-negative for acute intracranial abnormality, multiple chronic infarcts. MRI is recommended not yet pending. Hospitalist to admit for altered mental status, UTI and possible aspiration pneumonia. Pt failed [facility] Swallow screen secondary to lethargy and inability to stay awake/alert. BSE ordered.      SLP Plan  Discharge SLP treatment due to (comment);All goals met       Recommendations  Diet recommendations: Dysphagia 3 (mechanical soft);Thin  liquid Liquids provided via: Cup;Straw Medication Administration: Whole meds with puree Supervision: Staff to assist with self feeding;Full supervision/cueing for compensatory strategies Compensations: Slow rate;Small sips/bites;Lingual sweep for clearance of pocketing;Multiple dry swallows after each bite/sip;Follow solids with liquid Postural Changes and/or Swallow Maneuvers: Seated upright 90 degrees;Upright 30-60 min after meal                Oral Care Recommendations: Oral care BID;Staff/trained caregiver to provide oral care Follow up Recommendations: None SLP Visit Diagnosis: Dysphagia, unspecified (R13.10) Plan: Discharge SLP treatment due to (comment);All goals met       Thank you,  Genene Churn, [place]                 [place] 12/31/2019, 4:03 PM

## 2019-12-31 NOTE — Evaluation (Signed)
Physical Therapy Evaluation Patient Details Name: [name] MRN: [id number] DOB: Mar 02, 1936 Today's Date: 12/31/2019   History of Present Illness  [name] is a 84 y.o. female with medical history significant for atrial fibrillation, diabetes mellitus, hypertension, systolic and diastolic CHF, asthma, CKD 3, gout.  Patient was brought to the ED via EMS.  Patient's was found down, lying in her front yard.  Patient's neighbor called EMS. At the time of my evaluation, patient's eyes are closed, she moans and groans when hypoxia, and moves her extremities, she is status post 4mg  Ativan.  She is not answering questions. History is obtained from chart review and EDP. Patient's daughter talked to her at about 42 AM, and patient's son talked to patient about 30 minutes prior to arrival.  Per ED provider notes there was also reports of right-sided facial droop.    Clinical Impression  Patient limited for functional mobility as stated below secondary to BLE weakness, fatigue and poor standing balance. Patient demonstrates unsteadiness upon standing without use of AD and tends to reach for wall, counter, etc. For balance without AD. She is able to transfer to and from standing from chair and toilet with min assist due to impaired strength and balance. She requires verbal cueing for sequencing and safety throughout today's session. She fatigues quickly with ambulation and demonstrates limited activity tolerance. Patient will benefit from continued physical therapy in hospital and recommended venue below to increase strength, balance, endurance for safe ADLs and gait.     Follow Up Recommendations Home health PT;Supervision/Assistance - 24 hour;Supervision for mobility/OOB    Equipment Recommendations  None recommended by PT    Recommendations for Other Services       Precautions / Restrictions Precautions Precautions: Fall Restrictions Weight Bearing Restrictions: No      Mobility  Bed  Mobility Overal bed mobility: Needs Assistance             General bed mobility comments: Patient seated in chair upon entry  Transfers Overall transfer level: Needs assistance Equipment used: None Transfers: Sit to/from Stand;Stand Pivot Transfers Sit to Stand: Min assist Stand pivot transfers: Min assist       General transfer comment: Patient able to stand from chair with min assist, holds on to chair arm rests for balance upon standing; able to transfer sit <=> stand from chair and toilet with min assist  Ambulation/Gait Ambulation/Gait assistance: Min assist Gait Distance (Feet): 30 Feet Assistive device: Rolling walker (2 wheeled) Gait Pattern/deviations: Decreased step length - right;Decreased step length - left;Decreased stride length;Shuffle Gait velocity: decreased   General Gait Details: Patient able to ambulate with min assist and min verbal cueing for mechanics of using RW as well as safety, slow labored gait, limited by fatigue  Stairs            Wheelchair Mobility    Modified Rankin (Stroke Patients Only)       Balance Overall balance assessment: Needs assistance Sitting-balance support: Feet supported;No upper extremity supported Sitting balance-Leahy Scale: Good Sitting balance - Comments: seated edge of chair   Standing balance support: No upper extremity supported Standing balance-Leahy Scale: Poor Standing balance comment: without AD                             Pertinent Vitals/Pain Pain Assessment: No/denies pain    Home Living Family/patient expects to be discharged to:: Private residence Living Arrangements: Children Available Help at Discharge:  Family;Available 24 hours/day Type of Home: Mobile home Home Access: Stairs to enter Entrance Stairs-Rails: None Entrance Stairs-Number of Steps: 2 Home Layout: One level Home Equipment: Walker - 2 wheels;Cane - single point;Bedside commode;Shower seat Additional Comments:  per patient report    Prior Function Level of Independence: Needs assistance   Gait / Transfers Assistance Needed: household ambulator without AD, States she has RW and SPC if needed  ADL's / Homemaking Assistance Needed: family assists and is available 24 hours        Hand Dominance   Dominant Hand: Right    Extremity/Trunk Assessment   Upper Extremity Assessment Upper Extremity Assessment: Overall WFL for tasks assessed    Lower Extremity Assessment Lower Extremity Assessment: Generalized weakness       Communication   Communication: No difficulties  Cognition Arousal/Alertness: Awake/alert Behavior During Therapy: Flat affect Overall Cognitive Status: Within Functional Limits for tasks assessed                                        General Comments      Exercises     Assessment/Plan    PT Assessment Patient needs continued PT services  PT Problem List Decreased strength;Decreased activity tolerance;Decreased balance;Decreased mobility;Decreased knowledge of use of DME;Decreased safety awareness       PT Treatment Interventions DME instruction;Gait training;Functional mobility training;Stair training;Therapeutic activities;Therapeutic exercise;Balance training;Neuromuscular re-education;Patient/family education    PT Goals (Current goals can be found in the Care Plan section)  Acute Rehab PT Goals Patient Stated Goal: return home with family to assist PT Goal Formulation: With patient Time For Goal Achievement: 01/14/20 Potential to Achieve Goals: Fair    Frequency Min 3X/week   Barriers to discharge        Co-evaluation               AM-PAC PT "6 Clicks" Mobility  Outcome Measure Help needed turning from your back to your side while in a flat bed without using bedrails?: A Little Help needed moving from lying on your back to sitting on the side of a flat bed without using bedrails?: A Little Help needed moving to and from a  bed to a chair (including a wheelchair)?: A Little Help needed standing up from a chair using your arms (e.g., wheelchair or bedside chair)?: A Little Help needed to walk in hospital room?: A Little Help needed climbing 3-5 steps with a railing? : A Lot 6 Click Score: 17    End of Session Equipment Utilized During Treatment: Gait belt Activity Tolerance: Patient limited by fatigue;Patient tolerated treatment well Patient left: in chair;with chair alarm set;with call bell/phone within reach Nurse Communication: Mobility status PT Visit Diagnosis: Unsteadiness on feet (R26.81);Other abnormalities of gait and mobility (R26.89);Muscle weakness (generalized) (M62.81)    Time: [id number] PT Time Calculation (min) (ACUTE ONLY): 38 min   Charges:   PT Evaluation $PT Eval Moderate Complexity: 1 Mod PT Treatments $Therapeutic Activity: 23-37 mins        11:32 AM, 12/31/19 Mearl Latin PT, DPT Physical Therapist at [facility]

## 2020-01-01 DIAGNOSIS — N179 Acute kidney failure, unspecified: Secondary | ICD-10-CM

## 2020-01-01 DIAGNOSIS — N390 Urinary tract infection, site not specified: Secondary | ICD-10-CM

## 2020-01-01 DIAGNOSIS — N1832 Chronic kidney disease, stage 3b: Secondary | ICD-10-CM

## 2020-01-01 DIAGNOSIS — J690 Pneumonitis due to inhalation of food and vomit: Principal | ICD-10-CM

## 2020-01-01 DIAGNOSIS — I5043 Acute on chronic combined systolic (congestive) and diastolic (congestive) heart failure: Secondary | ICD-10-CM

## 2020-01-01 LAB — CBC
HCT: 40.2 % (ref 36.0–46.0)
Hemoglobin: 12 g/dL (ref 12.0–15.0)
MCH: 26.4 pg (ref 26.0–34.0)
MCHC: 29.9 g/dL — ABNORMAL LOW (ref 30.0–36.0)
MCV: 88.5 fL (ref 80.0–100.0)
Platelets: 279 10*3/uL (ref 150–400)
RBC: 4.54 MIL/uL (ref 3.87–5.11)
RDW: 14 % (ref 11.5–15.5)
WBC: 6.7 10*3/uL (ref 4.0–10.5)
nRBC: 0 % (ref 0.0–0.2)

## 2020-01-01 LAB — BASIC METABOLIC PANEL
Anion gap: 13 (ref 5–15)
BUN: 18 mg/dL (ref 8–23)
CO2: 22 mmol/L (ref 22–32)
Calcium: 9.7 mg/dL (ref 8.9–10.3)
Chloride: 108 mmol/L (ref 98–111)
Creatinine, Ser: 1.98 mg/dL — ABNORMAL HIGH (ref 0.44–1.00)
GFR calc Af Amer: 27 mL/min — ABNORMAL LOW (ref >60)
GFR calc non Af Amer: 23 mL/min — ABNORMAL LOW (ref >60)
Glucose, Bld: 161 mg/dL — ABNORMAL HIGH (ref 70–99)
Potassium: 3.5 mmol/L (ref 3.5–5.1)
Sodium: 143 mmol/L (ref 135–145)

## 2020-01-01 LAB — GLUCOSE, CAPILLARY
Glucose-Capillary: 138 mg/dL — ABNORMAL HIGH (ref 70–99)
Glucose-Capillary: 159 mg/dL — ABNORMAL HIGH (ref 70–99)
Glucose-Capillary: 202 mg/dL — ABNORMAL HIGH (ref 70–99)

## 2020-01-01 LAB — MAGNESIUM: Magnesium: 1.9 mg/dL (ref 1.7–2.4)

## 2020-01-01 MED ORDER — LEVETIRACETAM 500 MG PO TABS: 500.0000 mg | ORAL_TABLET | Freq: Two times a day (BID) | ORAL | 1 refills | Status: AC

## 2020-01-01 MED ORDER — AMOXICILLIN-POT CLAVULANATE 500-125 MG PO TABS: 1.0000 | ORAL_TABLET | Freq: Two times a day (BID) | ORAL | 0 refills | Status: AC

## 2020-01-01 MED ORDER — FUROSEMIDE 40 MG PO TABS: 60.0000 mg | ORAL_TABLET | Freq: Every day | ORAL | Status: DC

## 2020-01-01 NOTE — Plan of Care (Signed)
  Problem: Education: Goal: Knowledge of General Education information will improve Description: Including pain rating scale, medication(s)/side effects and non-pharmacologic comfort measures 01/01/2020 1119 by Santa Lighter, RN Outcome: Adequate for Discharge 01/01/2020 [phone] by Santa Lighter, RN Outcome: Progressing   Problem: Clinical Measurements: Goal: Ability to maintain clinical measurements within normal limits will improve Outcome: Adequate for Discharge Goal: Will remain free from infection Outcome: Adequate for Discharge Goal: Diagnostic test results will improve Outcome: Adequate for Discharge Goal: Respiratory complications will improve Outcome: Adequate for Discharge Goal: Cardiovascular complication will be avoided Outcome: Adequate for Discharge   Problem: Activity: Goal: Risk for activity intolerance will decrease Outcome: Adequate for Discharge   Problem: Nutrition: Goal: Adequate nutrition will be maintained Outcome: Adequate for Discharge   Problem: Coping: Goal: Level of anxiety will decrease Outcome: Adequate for Discharge   Problem: Elimination: Goal: Will not experience complications related to bowel motility Outcome: Adequate for Discharge Goal: Will not experience complications related to urinary retention Outcome: Adequate for Discharge   Problem: Pain Managment: Goal: General experience of comfort will improve Outcome: Adequate for Discharge   Problem: Safety: Goal: Ability to remain free from injury will improve Outcome: Adequate for Discharge   Problem: Skin Integrity: Goal: Risk for impaired skin integrity will decrease Outcome: Adequate for Discharge   Problem: Education: Goal: Knowledge of disease or condition will improve 01/01/2020 1119 by Santa Lighter, RN Outcome: Adequate for Discharge 01/01/2020 1119 by Santa Lighter, RN Outcome: Progressing Goal: Knowledge of secondary prevention will improve 01/01/2020 1119 by Santa Lighter, RN Outcome: Adequate for Discharge 01/01/2020 1119 by Santa Lighter, RN Outcome: Progressing Goal: Knowledge of patient specific risk factors addressed and post discharge goals established will improve 01/01/2020 1119 by Santa Lighter, RN Outcome: Adequate for Discharge 01/01/2020 1119 by Santa Lighter, RN Outcome: Progressing Goal: Individualized Educational Video(s) Outcome: Adequate for Discharge   Problem: Coping: Goal: Will verbalize positive feelings about self Outcome: Adequate for Discharge Goal: Will identify appropriate support needs Outcome: Adequate for Discharge   Problem: Health Behavior/Discharge Planning: Goal: Ability to manage health-related needs will improve Outcome: Adequate for Discharge

## 2020-01-01 NOTE — Progress Notes (Signed)
Nsg Discharge Note  Admit Date:  12/26/2019 Discharge date: 01/01/2020   YAEKO ROSZAK to be D/C'd Home per MD order.  AVS completed.  Copy for chart, and copy for patient signed, and dated. Reviewed d/c paperwork with patient and daughter. Answered all questions. Stable patient and belongings were wheeled to short stay entrance where she was picked up by her daughter to d/c to home. Patient/caregiver able to verbalize understanding.  Discharge Medication: Allergies as of 01/01/2020      Reactions   Yellow Jacket Venom [bee Venom] Shortness Of Breath, Itching      Medication List    TAKE these medications   allopurinol 100 MG tablet Commonly known as: ZYLOPRIM Take 100 mg by mouth daily.   amoxicillin-clavulanate 500-125 MG tablet Commonly known as: AUGMENTIN Take 1 tablet (500 mg total) by mouth every 12 (twelve) hours for 5 days.   apixaban 2.5 MG Tabs tablet Commonly known as: Eliquis Take 1 tablet (2.5 mg total) by mouth 2 (two) times daily.   atorvastatin 20 MG tablet Commonly known as: LIPITOR Take 1 tablet (20 mg total) by mouth daily at 6 PM. What changed: when to take this   carvedilol 12.5 MG tablet Commonly known as: COREG TAKE 1 TABLET(12.5 MG) BY MOUTH TWICE DAILY WITH A MEAL What changed: See the new instructions.   cholecalciferol 25 MCG (1000 UNIT) tablet Commonly known as: VITAMIN D Take 1,000 Units by mouth 2 (two) times daily.   donepezil 10 MG tablet Commonly known as: ARICEPT Take 10 mg by mouth daily.   fluticasone 50 MCG/ACT nasal spray Commonly known as: FLONASE Place 1 spray into both nostrils daily as needed for allergies or rhinitis.   furosemide 40 MG tablet Commonly known as: LASIX Take 1.5 tablets (60 mg total) by mouth daily. Start taking on: January 02, 2020 What changed: how much to take   gabapentin 100 MG capsule Commonly known as: NEURONTIN Take 100 mg by mouth 3 (three) times daily.   hydrALAZINE 50 MG tablet Commonly  known as: APRESOLINE Take 1.5 tablets (75 mg total) by mouth every 8 (eight) hours.   isosorbide mononitrate 30 MG 24 hr tablet Commonly known as: IMDUR Take 1 tablet (30 mg total) by mouth daily.   levETIRAcetam 500 MG tablet Commonly known as: KEPPRA Take 1 tablet (500 mg total) by mouth 2 (two) times daily.   OXYGEN Inhale 2 L into the lungs daily as needed (for shortness of breath).   pioglitazone 15 MG tablet Commonly known as: ACTOS Take 15 mg by mouth daily with breakfast.   sitaGLIPtin 50 MG tablet Commonly known as: JANUVIA Take 50 mg by mouth daily.       Discharge Assessment: Vitals:   12/31/19 2021 01/01/20 0501  BP: (!) 154/87 (!) 185/104  Pulse: 76 97  Resp: 19 18  Temp: 98.4 F (36.9 C) 98.4 F (36.9 C)  SpO2: 98% 92%   Skin clean, dry and intact without evidence of skin break down, no evidence of skin tears noted. IV catheter discontinued intact. Site without signs and symptoms of complications - no redness or edema noted at insertion site, patient denies c/o pain - only slight tenderness at site.  Dressing with slight pressure applied.  D/c Instructions-Education: Discharge instructions given to patient/family with verbalized understanding. D/c education completed with patient/family including follow up instructions, medication list, d/c activities limitations if indicated, with other d/c instructions as indicated by MD - patient able to verbalize understanding, all questions  fully answered. Patient instructed to return to ED, call 911, or call MD for any changes in condition.  Patient escorted via [place], and D/C home via private auto.  Santa Lighter, RN 01/01/2020 11:56 AM

## 2020-01-01 NOTE — Plan of Care (Signed)
  Problem: Education: Goal: Knowledge of General Education information will improve Description: Including pain rating scale, medication(s)/side effects and non-pharmacologic comfort measures Outcome: Progressing   Problem: Health Behavior/Discharge Planning: Goal: Ability to manage health-related needs will improve Outcome: Progressing   Problem: Education: Goal: Knowledge of disease or condition will improve Outcome: Progressing Goal: Knowledge of secondary prevention will improve Outcome: Progressing Goal: Knowledge of patient specific risk factors addressed and post discharge goals established will improve Outcome: Progressing

## 2020-01-01 NOTE — TOC Transition Note (Signed)
Transition of Care [facility]) - CM/SW Discharge Note   Patient Details  Name: [name] MRN: [id number] Date of Birth: 1936-07-10  [facility] CM/SW Contact:  Shade Flood, LCSW Phone Number: 01/01/2020, 12:24 PM   Clinical Narrative:     Pt stable for dc today per MD. [place] arranged with [facility] for PT at home. Pt and family updated. There are no other TOC needs for dc.  Final next level of care: Home w Home Health Services Barriers to Discharge: Barriers Resolved   Patient Goals and CMS Choice   CMS Medicare.gov Compare Post Acute Care list provided to:: Patient Choice offered to / list presented to : Patient  Discharge Placement                       Discharge Plan and Services                          HH Arranged: PT [facility] Agency: [place] ([facility]) Date [place]: 01/01/20   Representative spoke with at [place]: [place] Interventions     Readmission Risk Interventions No flowsheet data found.

## 2020-01-01 NOTE — Discharge Summary (Signed)
Physician Discharge Summary  [name] [id number] DOB: February 20, 1936 DOA: 12/26/2019  PCP: Jani Gravel, MD  Admit date: 12/26/2019 Discharge date: 01/01/2020  Time spent: 35 minutes  Recommendations for Outpatient Follow-up:  1. Repeat basic metabolic panel to follow electrolytes and renal function 2. Reassess blood pressure and adjust antihypertensive regimen as needed 3. Close monitoring to patient's CBGs 4. -Patient she has follow-up with cardiology and neurology as per instructions.   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  Patient discharged home with home health services for physical therapy and instruction to follow-up with PCP and neurology as an outpatient.  Diet recommendation: Heart healthy/low sodium and modified carbohydrate diet.  (Dysphagia 3 diet with thin liquids recommended by speech therapy).  Filed Weights   12/29/19 0357 12/31/19 0525 01/01/20 0500  Weight: 81.3 kg 84.6 kg 83.4 kg    History of present illness:  As per H&P written by Dr. Denton Brick on 12/26/2019 84 y.o. female with medical history significant for atrial fibrillation, diabetes mellitus, hypertension, systolic and diastolic CHF, asthma, CKD 3, gout.  Patient was brought to the ED via EMS.  Patient's was found down, lying in her front yard.  Patient's neighbor called EMS.   At the time of my evaluation, patient's eyes are closed, she moans and groans when hypoxia, and moves her extremities, she is status post 4mg  Ativan.  She is not answering questions. History is obtained from chart review and EDP. Patient's daughter talked to her at about 88 AM, and patient's son  talked to patient about 30 minutes prior to arrival.  Per ED provider notes there was also reports of right-sided facial droop.  ED Course: On arrival in the ED, patient was very agitated and confused requiring total of 4 mg of Ativan.  Blood pressure systolic [id number] to [id number].  O2 sats greater than 92% on 2 L O2.  Temperature 98.4.  Tachypneic.  WBC 6.5.  UA with positive nitrite small leukocytes and many bacteria.  Stat head and cervical CT-negative for acute intracranial abnormality, multiple chronic infarcts.  CTA chest-motion artifacts.  No large central lobular filling defects.  Left lower lobe consolidation and groundglass opacity concerning for pneumonia or sequelae of aspiration. Telemetry neurology consulted in the ED-possible seizure versus metabolic encephalopathy.  MRI recommended. IV ceftriaxone and azithromycin given.  Hospitalist to admit for altered mental status, UTI and possible aspiration pneumonia  Hospital Course:  Acute encephalopathy: Metabolic in nature secondary to acute UTI and pneumonia. -CT head and MRI negative for acute abnormalities -EEG no seizure activity noted.   -Following neurology recommendations patient will be empirically cover by Keppra twice a day and follow-up in 4 weeks as an outpatient. -Will complete antibiotic therapy at this moment using Augmentin for 5 more days pending at discharge. -Patient advised to maintain adequate hydration and nutrition -Constant reorientation discussed and encouraged with patient's family member.  Left lower lobe pneumonia -Influenza and Covid testing negative -Some concern for aspiration bottle based on patient's x-rays and normalities in location; also given presentation appears in the presence of confusion will trigger some increased chances for aspiration. -Patient seen by speech therapy who has recommended dysphagia 3 diet with thin liquids. -Continue aspiration precaution -Patient  will be discharged on Augmentin to  complete antibiotic therapy.  Pansensitive E. coli UTI -As mentioned above will be treated now with Augmentin to complete antibiotic therapy -Advised to maintain adequate hydration.  Acute kidney injury and chronic kidney disease a stage IIIb -Patient GFR patient's stage is 3B -After holding nephrotoxic agents, adjust medications to renal dose and providing fluid resuscitation her renal function is back to baseline at discharge -Please repeat basic metabolic panel follow-up visit to reassess trend and the stability.  Type 2 diabetes with nephropathy -A1c 6.6 -Resume home hypoglycemic regimen -Continue to follow CBGs and further adjust treatment as needed.  Chronic intrafibrillation -Continue beta-blocker for rate control -Currently stable -Continue home Eliquis for secondary prevention.  Essential hypertension -Resume home antihypertensive regimen -Heart healthy diet has been encouraged.  Chronic combined systolic and diastolic heart failure -Currently compensated -Last ejection fraction around 30-35% -Resume home heart failure regimen -Advised to follow low-sodium diet, check weight on daily basis and to follow-up with cardiology as previously scheduled. -Grade 2 diastolic dysfunction appreciated on latest echo.  History of gout -No acute flare appreciated -Continue the use of allopurinol.  History of asthma -No wheezing -Currently requiring oxygen supplementation -Continue home as needed bronchodilators and the use of Flonase.  Procedures:  See below for x-ray reports  EEG: Negative for acute epileptic waveforms.  Consultations:  Neurology service   Discharge Exam: Vitals:   12/31/19 2021 01/01/20 0501  BP: (!) 154/87 (!) 185/104  Pulse: 76 97  Resp: 19 18  Temp: 98.4 F (36.9 C) 98.4 F (36.9 C)  SpO2: 98% 92%    General: Afebrile, following commands appropriately, reporting no nausea, no vomiting, no shortness of breath or chest  pain. Cardiovascular: Rate controlled, no rubs, no gallops, no JVD on exam. Respiratory: Good air movement bilaterally, no using accessory muscles.  Positive rhonchi are appreciated bilaterally.  No wheezing or crackles. Abdomen: Soft, nontender, distended, positive bowel sounds. Extremities: No cyanosis, no clubbing.  Discharge Instructions   Discharge Instructions    (HEART FAILURE PATIENTS) Call MD:  Anytime you have any of the following symptoms: 1) 3 pound weight gain in 24 hours or 5 pounds in 1 week 2) shortness of breath, with or without a dry hacking cough 3) swelling in the hands, feet or stomach 4) if you have to sleep on extra pillows at night in order to breathe.   Complete by: As directed    Diet - low sodium heart healthy   Complete by: As directed    Discharge instructions   Complete by: As directed    Take medications as prescribed Arrange follow-up with PCP in 10 days Complete antibiotic therapy as instructed Maintain adequate hydration.   Increase activity slowly   Complete by: As directed      Allergies as of 01/01/2020      Reactions   Yellow Jacket Venom [bee Venom] Shortness Of Breath, Itching      Medication List    TAKE these medications   allopurinol 100 MG tablet Commonly known as: ZYLOPRIM Take 100 mg by mouth daily.   amoxicillin-clavulanate 500-125 MG tablet Commonly known as: AUGMENTIN Take 1 tablet (500 mg total) by mouth every 12 (twelve) hours for 5 days.   apixaban 2.5 MG Tabs tablet Commonly known as: Eliquis Take 1 tablet (2.5 mg total) by mouth 2 (two) times daily.   atorvastatin 20 MG tablet Commonly known as: LIPITOR Take 1 tablet (20 mg total) by mouth daily at 6 PM.  What changed: when to take this   carvedilol 12.5 MG tablet Commonly known as: COREG TAKE 1 TABLET(12.5 MG) BY MOUTH TWICE DAILY WITH A MEAL What changed: See the new instructions.   cholecalciferol 25 MCG (1000 UNIT) tablet Commonly known as: VITAMIN D Take  1,000 Units by mouth 2 (two) times daily.   donepezil 10 MG tablet Commonly known as: ARICEPT Take 10 mg by mouth daily.   fluticasone 50 MCG/ACT nasal spray Commonly known as: FLONASE Place 1 spray into both nostrils daily as needed for allergies or rhinitis.   furosemide 40 MG tablet Commonly known as: LASIX Take 1.5 tablets (60 mg total) by mouth daily. Start taking on: January 02, 2020 What changed: how much to take   gabapentin 100 MG capsule Commonly known as: NEURONTIN Take 100 mg by mouth 3 (three) times daily.   hydrALAZINE 50 MG tablet Commonly known as: APRESOLINE Take 1.5 tablets (75 mg total) by mouth every 8 (eight) hours.   isosorbide mononitrate 30 MG 24 hr tablet Commonly known as: IMDUR Take 1 tablet (30 mg total) by mouth daily.   levETIRAcetam 500 MG tablet Commonly known as: KEPPRA Take 1 tablet (500 mg total) by mouth 2 (two) times daily.   OXYGEN Inhale 2 L into the lungs daily as needed (for shortness of breath).   pioglitazone 15 MG tablet Commonly known as: ACTOS Take 15 mg by mouth daily with breakfast.   sitaGLIPtin 50 MG tablet Commonly known as: JANUVIA Take 50 mg by mouth daily.      Allergies  Allergen Reactions  . Yellow Jacket Venom [Bee Venom] Shortness Of Breath and Itching   Follow-up Information    Jani Gravel, MD. Schedule an appointment as soon as possible for a visit in 10 day(s).   Specialty: Internal Medicine Contact information: [address] [place] [address] [phone]        Satira Sark, MD .   Specialty: Cardiology Contact information: [place] [address] [phone]        Phillips Odor, MD. Schedule an appointment as soon as possible for a visit in 5 week(s).   Specialty: Neurology Contact information: [address] [place] [address] [phone]           The results of significant diagnostics from this hospitalization  (including imaging, microbiology, ancillary and laboratory) are listed below for reference.    Significant Diagnostic Studies: DG Chest 1 View  Result Date: 12/28/2019 CLINICAL DATA:  Right PICC line placement EXAM: CHEST  1 VIEW COMPARISON:  Radiograph 12/26/2019, CT 12/26/2019 FINDINGS: Right upper extremity PICC terminates at superior cavoatrial junction. The aorta is calcified. The remaining cardiomediastinal contours are unremarkable. There is worsening opacity which appears largely confined to the right upper lobe. Some mild increase in the left upper lobe opacity is noted as well. Persistent hazy bibasilar opacities including density towards the left cardiac apex. None no pneumothorax. Slight obscuration of the left hemidiaphragm could reflect consolidation along the lung base or small volume pleural fluid. No acute osseous or soft tissue abnormality. Degenerative changes are present in the imaged spine and shoulders. Telemetry leads overlie the chest. IMPRESSION: 1. Right upper extremity PICC terminates at superior cavoatrial junction. 2. Worsening bilateral lung opacities, most notably in the right upper lobe. Electronically Signed   By: Lovena Le M.D.   On: 12/28/2019 19:01   CT ANGIO CHEST PE W OR WO CONTRAST  Result Date: 12/26/2019 CLINICAL  DATA:  Found down, abnormal chest radiograph, combative patient EXAM: CT ANGIOGRAPHY CHEST WITH CONTRAST TECHNIQUE: Multidetector CT imaging of the chest was performed using the standard protocol during bolus administration of intravenous contrast. Multiplanar CT image reconstructions and MIPs were obtained to evaluate the vascular anatomy. CONTRAST:  20mL OMNIPAQUE IOHEXOL 350 MG/ML SOLN COMPARISON:  CT 04/24/2018, chest radiograph 12/26/2019 FINDINGS: Cardiovascular: Satisfactory opacification of the pulmonary arteries however evaluation is significantly limited due to extensive patient motion respiratory motion artifacts. No large central or lobar  filling defects are identified. More distal evaluation is limited due to motion. Borderline cardiomegaly. Atherosclerotic calcifications of the coronary arteries are noted. Normal caliber thoracic aorta with extensive atherosclerotic plaque both calcified and noncalcified including some peripherally marginated plaque in the descending thoracic aorta. Normal 3 vessel branching of the aortic arch. Calcification of the proximal great vessels is noted as well. Mediastinum/Nodes: No mediastinal fluid or gas. Thyroid gland and thoracic inlet are unremarkable. No acute abnormality of the trachea or esophagus. Small amount of fluid seen in the distal thoracic esophagus, could reflect mild reflux. Lungs/Pleura: Evaluation of the lung parenchyma limited given respiratory motion artifact. Extensive atelectatic changes are noted posteriorly in the lungs. Corresponding well to the abnormality on radiography is a mixed consolidative and ground-glass opacity in the left lung base. Upper Abdomen: No acute abnormalities present in the visualized portions of the upper abdomen. Musculoskeletal: Multilevel degenerative changes are present in the imaged portions of the spine. Additional degenerative changes throughout the shoulders. No acute osseous abnormality or suspicious osseous lesion. Evaluation for subtle fractures limited given motion. Review of the MIP images confirms the above findings. IMPRESSION: 1. Study is significantly limited due to extensive patient motion and respiratory motion artifacts. 2. No large central or lobar filling defects are identified. More distal evaluation is precluded due to artifact. 3. Left lower lobe mixed consolidative and ground-glass opacity, concerning for pneumonia or sequela of aspiration. Recommend follow-up imaging after the resolution of the acute symptoms to document resolution. 4. Small amount of fluid in the distal thoracic esophagus, could reflect mild reflux. 5. Coronary artery  atherosclerosis.  Borderline cardiomegaly. 6. Aortic Atherosclerosis (ICD10-I70.0). Electronically Signed   By: Lovena Le M.D.   On: 12/26/2019 16:50   CT CERVICAL SPINE WO CONTRAST  Result Date: 12/26/2019 CLINICAL DATA:  Neuro deficit, acute, stroke suspected. Additional history provided: Patient found face down on ground at [place] prior to arrival. EXAM: CT HEAD WITHOUT CONTRAST CT CERVICAL SPINE WITHOUT CONTRAST TECHNIQUE: Multidetector CT imaging of the head and cervical spine was performed following the standard protocol without intravenous contrast. Multiplanar CT image reconstructions of the cervical spine were also generated. COMPARISON:  Head CT 05/14/2018, brain MRI 05/15/2018. FINDINGS: CT HEAD FINDINGS Brain: There is no evidence of acute intracranial hemorrhage. Redemonstrated chronic left occipital lobe cortically based infarct. No new demarcated cortical infarction is identified. Redemonstrated chronic lacunar infarct within the deep right frontal lobe white matter. Additional mild ill-defined hypoattenuation within the cerebral white matter is nonspecific, but consistent with chronic small vessel ischemic disease. Mild generalized parenchymal atrophy. Redemonstrated chronic infarcts within the left greater than right cerebellar hemispheres. No evidence of intracranial mass. No midline shift or extra-axial fluid collection. Vascular: No hyperdense vessel.  Atherosclerotic calcifications. Skull: Normal. Negative for fracture or focal lesion. Unchanged multifocal calvarial thickening. Sinuses/Orbits: Visualized orbits demonstrate no acute abnormality. No significant paranasal sinus disease or mastoid effusion. These results were called by telephone at the time of interpretation on 12/26/2019 at 4:47  pm to provider Dr. Sabra Heck, who verbally acknowledged these results. CT CERVICAL SPINE FINDINGS Alignment: Mild leftward rotation of C1 relative to C2 may be related to patient head position at the  time of examination. A cervical dextrocurvature may also be positional. Reversal of the expected cervical lordosis. No significant spondylolisthesis. Skull base and vertebrae: The basion-dental and atlanto-dental intervals are maintained.No evidence of acute fracture to the cervical spine. Soft tissues and spinal canal: No prevertebral fluid or swelling. No visible canal hematoma. Disc levels: Cervical spondylosis with multilevel disc height loss, posterior disc osteophytes, uncovertebral and facet hypertrophy. There is also multilevel ossification of the posterior longitudinal ligament. Prominent ventrolateral osteophyte at C6-C7. Upper chest: No consolidation within the imaged lung apices. Calcified atherosclerotic plaque within the visualized aortic arch and proximal major branch vessels of the neck. IMPRESSION: CT head: 1. No evidence of acute intracranial abnormality. 2. Redemonstration of multiple chronic infarcts as detailed. 3. Background mild generalized parenchymal atrophy and chronic small vessel ischemic disease. CT cervical spine: 1. No evidence of acute fracture to the cervical spine. 2. Cervical spondylosis with multilevel ossification of the posterior longitudinal ligament. Electronically Signed   By: Kellie Simmering DO   On: 12/26/2019 16:54   MR ANGIO HEAD WO CONTRAST  Result Date: 12/31/2019 CLINICAL DATA:  Encephalopathy. EXAM: MRI HEAD WITHOUT CONTRAST MRA HEAD WITHOUT CONTRAST TECHNIQUE: Multiplanar, multiecho pulse sequences of the brain and surrounding structures were obtained without intravenous contrast. Angiographic images of the head were obtained using MRA technique without contrast. COMPARISON:  Head CT 12/26/2019, brain MRI 05/15/2018 FINDINGS: MRI HEAD FINDINGS Brain: Please note the patient was unable to complete the entirety of the examination. As a result, only sagittal T1 weighted, axial and coronal diffusion-weighted, and axial T2 weighted sequences were acquired. Axial T2  FLAIR, axial T2* and coronal T2 weighted sequences could not be obtained. There is no evidence of acute infarct. No evidence of intracranial mass. No midline shift or extra-axial fluid collection. Redemonstration of multiple chronic infarcts as follows. Chronic PCA territory cortically based left occipital lobe infarct. Chronic lacunar infarct within the deep right frontal lobe white matter. Chronic lacunar infarct within the left thalamus. Chronic infarcts within the bilateral cerebellar hemispheres. No new infarct is identified. Background moderate chronic small vessel ischemic disease. Mild generalized parenchymal atrophy. Vascular: Reported separately Skull and upper cervical spine: No focal marrow lesion Sinuses/Orbits: Visualized orbits demonstrate no acute abnormality. Minimal ethmoid sinus mucosal thickening. No significant mastoid effusion. MRA HEAD FINDINGS The intracranial internal carotid arteries are patent. Moderate focal stenosis of the supraclinoid left ICA (series 9, image 74) (series 3, image 22). No other significant stenosis within these vessels. The M1 right middle cerebral artery is patent without significant stenosis. Atherosclerotic irregularity of right M2 branches. No proximal M2 branch occlusion is identified. The right anterior cerebral artery is patent without high-grade proximal stenosis. The left middle cerebral artery is patent without significant stenosis. Atherosclerotic irregularity of M2 left MCA branches. No proximal M2 branch occlusion is identified. The left anterior cerebral artery is patent without high-grade proximal stenosis. No intracranial aneurysm is identified. Paucity of flow related signal within portions of the non dominant intracranial right vertebral artery suggestive of high-grade stenosis with possible partial occlusion. The dominant intracranial left vertebral artery is patent without significant stenosis, as is the basilar artery. The bilateral posterior  cerebral arteries are patent. However, there are multifocal high-grade stenoses within the P2 posterior cerebral arteries bilaterally. A high-grade stenosis is also identified within the  P3 right PCA. Posterior communicating arteries are poorly delineated and may be hypoplastic or absent bilaterally. IMPRESSION: MRI brain: 1. Prematurely terminated examination as described. 2. No evidence of acute infarct or other acute intracranial abnormality. 3. Redemonstration of multiple chronic infarcts as described. Background moderate chronic small vessel ischemic disease. 4. Mild generalized parenchymal atrophy. MRA head: 1. Intracranial atherosclerotic disease with multifocal stenoses, most notably as follows. 2. Moderate focal stenosis within the supraclinoid left internal carotid artery. 3. High-grade stenosis of the non dominant intracranial right vertebral artery with possible partial occlusion. 4. Multifocal high-grade stenoses within the P2 posterior cerebral arteries bilaterally. High-grade focal stenosis also present within the P3 right PCA. Electronically Signed   By: Kellie Simmering DO   On: 12/31/2019 14:59   MR BRAIN WO CONTRAST  Result Date: 12/31/2019 CLINICAL DATA:  Encephalopathy. EXAM: MRI HEAD WITHOUT CONTRAST MRA HEAD WITHOUT CONTRAST TECHNIQUE: Multiplanar, multiecho pulse sequences of the brain and surrounding structures were obtained without intravenous contrast. Angiographic images of the head were obtained using MRA technique without contrast. COMPARISON:  Head CT 12/26/2019, brain MRI 05/15/2018 FINDINGS: MRI HEAD FINDINGS Brain: Please note the patient was unable to complete the entirety of the examination. As a result, only sagittal T1 weighted, axial and coronal diffusion-weighted, and axial T2 weighted sequences were acquired. Axial T2 FLAIR, axial T2* and coronal T2 weighted sequences could not be obtained. There is no evidence of acute infarct. No evidence of intracranial mass. No midline  shift or extra-axial fluid collection. Redemonstration of multiple chronic infarcts as follows. Chronic PCA territory cortically based left occipital lobe infarct. Chronic lacunar infarct within the deep right frontal lobe white matter. Chronic lacunar infarct within the left thalamus. Chronic infarcts within the bilateral cerebellar hemispheres. No new infarct is identified. Background moderate chronic small vessel ischemic disease. Mild generalized parenchymal atrophy. Vascular: Reported separately Skull and upper cervical spine: No focal marrow lesion Sinuses/Orbits: Visualized orbits demonstrate no acute abnormality. Minimal ethmoid sinus mucosal thickening. No significant mastoid effusion. MRA HEAD FINDINGS The intracranial internal carotid arteries are patent. Moderate focal stenosis of the supraclinoid left ICA (series 9, image 74) (series 3, image 22). No other significant stenosis within these vessels. The M1 right middle cerebral artery is patent without significant stenosis. Atherosclerotic irregularity of right M2 branches. No proximal M2 branch occlusion is identified. The right anterior cerebral artery is patent without high-grade proximal stenosis. The left middle cerebral artery is patent without significant stenosis. Atherosclerotic irregularity of M2 left MCA branches. No proximal M2 branch occlusion is identified. The left anterior cerebral artery is patent without high-grade proximal stenosis. No intracranial aneurysm is identified. Paucity of flow related signal within portions of the non dominant intracranial right vertebral artery suggestive of high-grade stenosis with possible partial occlusion. The dominant intracranial left vertebral artery is patent without significant stenosis, as is the basilar artery. The bilateral posterior cerebral arteries are patent. However, there are multifocal high-grade stenoses within the P2 posterior cerebral arteries bilaterally. A high-grade stenosis is also  identified within the P3 right PCA. Posterior communicating arteries are poorly delineated and may be hypoplastic or absent bilaterally. IMPRESSION: MRI brain: 1. Prematurely terminated examination as described. 2. No evidence of acute infarct or other acute intracranial abnormality. 3. Redemonstration of multiple chronic infarcts as described. Background moderate chronic small vessel ischemic disease. 4. Mild generalized parenchymal atrophy. MRA head: 1. Intracranial atherosclerotic disease with multifocal stenoses, most notably as follows. 2. Moderate focal stenosis within the supraclinoid left internal carotid artery.  3. High-grade stenosis of the non dominant intracranial right vertebral artery with possible partial occlusion. 4. Multifocal high-grade stenoses within the P2 posterior cerebral arteries bilaterally. High-grade focal stenosis also present within the P3 right PCA. Electronically Signed   By: Kellie Simmering DO   On: 12/31/2019 14:59   DG Chest Portable 1 View  Result Date: 12/26/2019 CLINICAL DATA:  Altered mental status EXAM: PORTABLE CHEST 1 VIEW COMPARISON:  06/27/2019, 01/22/2019 FINDINGS: Low lung volumes. Mild cardiomegaly with aortic atherosclerosis. Mild opacity at the cardiac apex. No pneumothorax. IMPRESSION: Low lung volumes with mild cardiomegaly. Opacity at the left cardiac apex, could be secondary to prominent epicardial fat though atelectasis or small focus airspace disease/pneumonia could also be considered. Electronically Signed   By: Donavan Foil M.D.   On: 12/26/2019 15:32   EEG adult  Result Date: 12/27/2019 Lora Havens, MD     12/27/2019  4:51 PM Patient Name: JUNKO LONGAKER MRN: [id number] Epilepsy Attending: Lora Havens Referring Physician/Provider: Dr Jenetta Downer Date: 12/27/2019 Duration: 23.49 mins Patient history: 84 year old female with chronic CVAs now presented with altered mental status.  EEG evaluate for seizures. Level of alertness:awake AEDs  during EEG study: Lorazepam Technical aspects: This EEG study was done with scalp electrodes positioned according to the 10-20 International system of electrode placement. Electrical activity was acquired at a sampling rate of 500Hz  and reviewed with a high frequency filter of 70Hz  and a low frequency filter of 1Hz . EEG data were recorded continuously and digitally stored. Description: No clear posterior dominant rhythm was seen. EEG showed continuous generalized 3-6Hz  theta-delta slowing. Hyperventilation and photic stimulation were not performed. Of note, study was technically difficult as patient was constantly moving and pulling electrodes. Abnormality - Continuous slow, generalized IMPRESSION: This technically difficult study is suggestive of moderate diffuse encephalopathy. No seizures or epileptiform discharges were seen throughout the recording. Priyanka Barbra Sarks   CT HEAD CODE STROKE WO CONTRAST  Result Date: 12/26/2019 CLINICAL DATA:  Neuro deficit, acute, stroke suspected. Additional history provided: Patient found face down on ground at [place] prior to arrival. EXAM: CT HEAD WITHOUT CONTRAST CT CERVICAL SPINE WITHOUT CONTRAST TECHNIQUE: Multidetector CT imaging of the head and cervical spine was performed following the standard protocol without intravenous contrast. Multiplanar CT image reconstructions of the cervical spine were also generated. COMPARISON:  Head CT 05/14/2018, brain MRI 05/15/2018. FINDINGS: CT HEAD FINDINGS Brain: There is no evidence of acute intracranial hemorrhage. Redemonstrated chronic left occipital lobe cortically based infarct. No new demarcated cortical infarction is identified. Redemonstrated chronic lacunar infarct within the deep right frontal lobe white matter. Additional mild ill-defined hypoattenuation within the cerebral white matter is nonspecific, but consistent with chronic small vessel ischemic disease. Mild generalized parenchymal atrophy. Redemonstrated chronic  infarcts within the left greater than right cerebellar hemispheres. No evidence of intracranial mass. No midline shift or extra-axial fluid collection. Vascular: No hyperdense vessel.  Atherosclerotic calcifications. Skull: Normal. Negative for fracture or focal lesion. Unchanged multifocal calvarial thickening. Sinuses/Orbits: Visualized orbits demonstrate no acute abnormality. No significant paranasal sinus disease or mastoid effusion. These results were called by telephone at the time of interpretation on 12/26/2019 at 4:47 pm to provider Dr. Sabra Heck, who verbally acknowledged these results. CT CERVICAL SPINE FINDINGS Alignment: Mild leftward rotation of C1 relative to C2 may be related to patient head position at the time of examination. A cervical dextrocurvature may also be positional. Reversal of the expected cervical lordosis. No significant spondylolisthesis. Skull base and vertebrae: The  basion-dental and atlanto-dental intervals are maintained.No evidence of acute fracture to the cervical spine. Soft tissues and spinal canal: No prevertebral fluid or swelling. No visible canal hematoma. Disc levels: Cervical spondylosis with multilevel disc height loss, posterior disc osteophytes, uncovertebral and facet hypertrophy. There is also multilevel ossification of the posterior longitudinal ligament. Prominent ventrolateral osteophyte at C6-C7. Upper chest: No consolidation within the imaged lung apices. Calcified atherosclerotic plaque within the visualized aortic arch and proximal major branch vessels of the neck. IMPRESSION: CT head: 1. No evidence of acute intracranial abnormality. 2. Redemonstration of multiple chronic infarcts as detailed. 3. Background mild generalized parenchymal atrophy and chronic small vessel ischemic disease. CT cervical spine: 1. No evidence of acute fracture to the cervical spine. 2. Cervical spondylosis with multilevel ossification of the posterior longitudinal ligament.  Electronically Signed   By: Kellie Simmering DO   On: 12/26/2019 16:54   [place] EKG SITE RITE  Result Date: 12/28/2019 If Site Rite image not attached, placement could not be confirmed due to current cardiac rhythm.   Microbiology: Recent Results (from the past 240 hour(s))  Respiratory Panel by RT PCR (Flu A&B, Covid) - Nasopharyngeal Swab     Status: None   Collection Time: 12/26/19  4:19 PM   Specimen: Nasopharyngeal Swab  Result Value Ref Range Status   SARS Coronavirus 2 by RT PCR NEGATIVE NEGATIVE Final    Comment: (NOTE) SARS-CoV-2 target nucleic acids are NOT DETECTED. The SARS-CoV-2 RNA is generally detectable in upper respiratoy specimens during the acute phase of infection. The lowest concentration of SARS-CoV-2 viral copies this assay can detect is 131 copies/mL. A negative result does not preclude SARS-Cov-2 infection and should not be used as the sole basis for treatment or other patient management decisions. A negative result may occur with  improper specimen collection/handling, submission of specimen other than nasopharyngeal swab, presence of viral mutation(s) within the areas targeted by this assay, and inadequate number of viral copies (<131 copies/mL). A negative result must be combined with clinical observations, patient history, and epidemiological information. The expected result is Negative. Fact Sheet for Patients:  PinkCheek.be Fact Sheet for Healthcare Providers:  GravelBags.it This test is not yet ap proved or cleared by the [place] FDA and  has been authorized for detection and/or diagnosis of SARS-CoV-2 by FDA under an Emergency Use Authorization (EUA). This EUA will remain  in effect (meaning this test can be used) for the duration of the COVID-19 declaration under Section 564(b)(1) of the Act, 21 U.S.C. section 360bbb-3(b)(1), unless the authorization is terminated or revoked sooner.     Influenza A by PCR NEGATIVE NEGATIVE Final   Influenza B by PCR NEGATIVE NEGATIVE Final    Comment: (NOTE) The Xpert Xpress SARS-CoV-2/FLU/RSV assay is intended as an aid in  the diagnosis of influenza from Nasopharyngeal swab specimens and  should not be used as a sole basis for treatment. Nasal washings and  aspirates are unacceptable for Xpert Xpress SARS-CoV-2/FLU/RSV  testing. Fact Sheet for Patients: PinkCheek.be Fact Sheet for Healthcare Providers: GravelBags.it This test is not yet approved or cleared by the [place] FDA and  has been authorized for detection and/or diagnosis of SARS-CoV-2 by  FDA under an Emergency Use Authorization (EUA). This EUA will remain  in effect (meaning this test can be used) for the duration of the  Covid-19 declaration under Section 564(b)(1) of the Act, 21  U.S.C. section 360bbb-3(b)(1), unless the authorization is  terminated or revoked. Performed at [facility]  [facility], [address], [place], [place] [address]   Urine culture     Status: Abnormal   Collection Time: 12/26/19  7:11 PM   Specimen: Urine, Clean Catch  Result Value Ref Range Status   Specimen Description   Final    URINE, CLEAN CATCH Performed at [facility], [address] [place], [place] [address]    Special Requests   Final    NONE Performed at [facility], [address], [place], [place] [address]    Culture >=100,000 COLONIES/mL ESCHERICHIA COLI (A)  Final   Report Status 12/29/2019 FINAL  Final   Organism ID, Bacteria ESCHERICHIA COLI (A)  Final      Susceptibility   Escherichia coli - MIC*    AMPICILLIN 4 SENSITIVE Sensitive     CEFAZOLIN <=4 SENSITIVE Sensitive     CEFTRIAXONE <=0.25 SENSITIVE Sensitive     CIPROFLOXACIN <=0.25 SENSITIVE Sensitive     GENTAMICIN <=1 SENSITIVE Sensitive     IMIPENEM <=0.25 SENSITIVE Sensitive     NITROFURANTOIN <=16 SENSITIVE Sensitive     TRIMETH/SULFA <=20 SENSITIVE  Sensitive     AMPICILLIN/SULBACTAM <=2 SENSITIVE Sensitive     PIP/TAZO <=4 SENSITIVE Sensitive     * >=100,000 COLONIES/mL ESCHERICHIA COLI     Labs: Basic Metabolic Panel: Recent Labs  Lab 12/26/19 1440 12/26/19 1440 12/27/19 0630 12/28/19 0937 12/30/19 0536 12/31/19 0422 01/01/20 0523  NA 141   < > 143 140 143 143 143  K 3.5   < > 4.4 4.1 3.4* 3.4* 3.5  CL 106   < > 110 107 109 111 108  CO2 22   < > 19* 20* 24 24 22   GLUCOSE 218*   < > 86 118* 152* 124* 161*  BUN 16   < > 12 9 17 19 18   CREATININE 1.56*   < > 1.26* 1.35* 2.39* 2.54* 1.98*  CALCIUM 9.3   < > 9.2 9.1 9.1 8.9 9.7  MG 1.9  --   --   --   --  1.8 1.9   < > = values in this interval not displayed.   Liver Function Tests: Recent Labs  Lab 12/26/19 1440  AST 16  ALT 8  ALKPHOS 98  BILITOT 0.4  PROT 7.6  ALBUMIN 3.7    Recent Labs  Lab 12/26/19 1642 12/28/19 1255  AMMONIA 34 16   CBC: Recent Labs  Lab 12/26/19 1440 12/26/19 1440 12/27/19 2301 12/28/19 1255 12/30/19 0536 12/31/19 0422 01/01/20 0523  WBC 6.5   < > 6.9 5.5 6.6 5.6 6.7  NEUTROABS 2.8  --   --   --   --   --   --   HGB 12.5   < > 12.0 11.8* 11.6* 10.5* 12.0  HCT 41.7   < > 40.3 39.3 38.1 35.2* 40.2  MCV 88.9   < > 88.4 89.1 86.6 88.4 88.5  PLT 266   < > 250 236 225 213 279   < > = values in this interval not displayed.   Cardiac Enzymes: Recent Labs  Lab 12/27/19 2301  CKTOTAL 255*   BNP: BNP (last 3 results) Recent Labs    06/27/19 2102  BNP 79.0   CBG: Recent Labs  Lab 12/31/19 2024 12/31/19 2329 01/01/20 0419 01/01/20 0719 01/01/20 1103  GLUCAP 147* 123* 138* 159* 202*    Signed:  Barton Dubois MD.  [facility] 01/01/2020, 11:57 AM

## 2020-01-01 NOTE — Plan of Care (Signed)
  Problem: Education: Goal: Knowledge of General Education information will improve Description Including pain rating scale, medication(s)/side effects and non-pharmacologic comfort measures Outcome: Progressing   Problem: Health Behavior/Discharge Planning: Goal: Ability to manage health-related needs will improve Outcome: Progressing   

## 2020-01-07 NOTE — ED Notes (Signed)
Per Primary RN, code stroke cancelled approximately 1503.

## 2020-02-12 ENCOUNTER — Other Ambulatory Visit: Payer: Self-pay | Admitting: Cardiology

## 2020-03-10 ENCOUNTER — Emergency Department (HOSPITAL_COMMUNITY): Payer: Medicare Other

## 2020-03-10 ENCOUNTER — Inpatient Hospital Stay (HOSPITAL_COMMUNITY)
Admission: EM | Admit: 2020-03-10 | Discharge: 2020-03-17 | DRG: 064 | Disposition: A | Discharge status: Home Health | Payer: Medicare Other | Attending: Family Medicine | Admitting: Family Medicine

## 2020-03-10 ENCOUNTER — Other Ambulatory Visit: Payer: Self-pay | Admitting: Cardiology

## 2020-03-10 ENCOUNTER — Encounter (HOSPITAL_COMMUNITY): Payer: Self-pay

## 2020-03-10 DIAGNOSIS — J45909 Unspecified asthma, uncomplicated: Secondary | ICD-10-CM | POA: Diagnosis present

## 2020-03-10 DIAGNOSIS — E876 Hypokalemia: Secondary | ICD-10-CM | POA: Diagnosis not present

## 2020-03-10 DIAGNOSIS — H5500 Unspecified nystagmus: Secondary | ICD-10-CM | POA: Diagnosis present

## 2020-03-10 DIAGNOSIS — K148 Other diseases of tongue: Secondary | ICD-10-CM | POA: Diagnosis present

## 2020-03-10 DIAGNOSIS — M109 Gout, unspecified: Secondary | ICD-10-CM | POA: Diagnosis present

## 2020-03-10 DIAGNOSIS — Z825 Family history of asthma and other chronic lower respiratory diseases: Secondary | ICD-10-CM

## 2020-03-10 DIAGNOSIS — G9341 Metabolic encephalopathy: Secondary | ICD-10-CM | POA: Diagnosis present

## 2020-03-10 DIAGNOSIS — Z7189 Other specified counseling: Secondary | ICD-10-CM

## 2020-03-10 DIAGNOSIS — I4891 Unspecified atrial fibrillation: Secondary | ICD-10-CM | POA: Diagnosis present

## 2020-03-10 DIAGNOSIS — E1122 Type 2 diabetes mellitus with diabetic chronic kidney disease: Secondary | ICD-10-CM | POA: Diagnosis present

## 2020-03-10 DIAGNOSIS — I6340 Cerebral infarction due to embolism of unspecified cerebral artery: Principal | ICD-10-CM | POA: Diagnosis present

## 2020-03-10 DIAGNOSIS — I130 Hypertensive heart and chronic kidney disease with heart failure and stage 1 through stage 4 chronic kidney disease, or unspecified chronic kidney disease: Secondary | ICD-10-CM | POA: Diagnosis present

## 2020-03-10 DIAGNOSIS — I428 Other cardiomyopathies: Secondary | ICD-10-CM | POA: Diagnosis present

## 2020-03-10 DIAGNOSIS — I5043 Acute on chronic combined systolic (congestive) and diastolic (congestive) heart failure: Secondary | ICD-10-CM | POA: Diagnosis present

## 2020-03-10 DIAGNOSIS — I440 Atrioventricular block, first degree: Secondary | ICD-10-CM | POA: Diagnosis present

## 2020-03-10 DIAGNOSIS — R471 Dysarthria and anarthria: Secondary | ICD-10-CM | POA: Diagnosis present

## 2020-03-10 DIAGNOSIS — I69318 Other symptoms and signs involving cognitive functions following cerebral infarction: Secondary | ICD-10-CM

## 2020-03-10 DIAGNOSIS — Z9842 Cataract extraction status, left eye: Secondary | ICD-10-CM

## 2020-03-10 DIAGNOSIS — I4581 Long QT syndrome: Secondary | ICD-10-CM | POA: Diagnosis present

## 2020-03-10 DIAGNOSIS — R29710 NIHSS score 10: Secondary | ICD-10-CM | POA: Diagnosis present

## 2020-03-10 DIAGNOSIS — G40909 Epilepsy, unspecified, not intractable, without status epilepticus: Secondary | ICD-10-CM | POA: Diagnosis present

## 2020-03-10 DIAGNOSIS — Z7984 Long term (current) use of oral hypoglycemic drugs: Secondary | ICD-10-CM

## 2020-03-10 DIAGNOSIS — I2510 Atherosclerotic heart disease of native coronary artery without angina pectoris: Secondary | ICD-10-CM | POA: Diagnosis present

## 2020-03-10 DIAGNOSIS — I340 Nonrheumatic mitral (valve) insufficiency: Secondary | ICD-10-CM | POA: Diagnosis present

## 2020-03-10 DIAGNOSIS — N3000 Acute cystitis without hematuria: Secondary | ICD-10-CM | POA: Diagnosis present

## 2020-03-10 DIAGNOSIS — J9610 Chronic respiratory failure, unspecified whether with hypoxia or hypercapnia: Secondary | ICD-10-CM | POA: Diagnosis present

## 2020-03-10 DIAGNOSIS — H518 Other specified disorders of binocular movement: Secondary | ICD-10-CM | POA: Diagnosis present

## 2020-03-10 DIAGNOSIS — R4781 Slurred speech: Secondary | ICD-10-CM | POA: Diagnosis present

## 2020-03-10 DIAGNOSIS — Z90710 Acquired absence of both cervix and uterus: Secondary | ICD-10-CM

## 2020-03-10 DIAGNOSIS — Z9841 Cataract extraction status, right eye: Secondary | ICD-10-CM

## 2020-03-10 DIAGNOSIS — F05 Delirium due to known physiological condition: Secondary | ICD-10-CM | POA: Diagnosis not present

## 2020-03-10 DIAGNOSIS — I639 Cerebral infarction, unspecified: Secondary | ICD-10-CM | POA: Diagnosis not present

## 2020-03-10 DIAGNOSIS — Z20822 Contact with and (suspected) exposure to covid-19: Secondary | ICD-10-CM | POA: Diagnosis present

## 2020-03-10 DIAGNOSIS — G9389 Other specified disorders of brain: Secondary | ICD-10-CM | POA: Diagnosis present

## 2020-03-10 DIAGNOSIS — R29810 Facial weakness: Secondary | ICD-10-CM | POA: Diagnosis present

## 2020-03-10 DIAGNOSIS — E785 Hyperlipidemia, unspecified: Secondary | ICD-10-CM | POA: Diagnosis not present

## 2020-03-10 DIAGNOSIS — I10 Essential (primary) hypertension: Secondary | ICD-10-CM | POA: Diagnosis not present

## 2020-03-10 DIAGNOSIS — I5082 Biventricular heart failure: Secondary | ICD-10-CM | POA: Diagnosis present

## 2020-03-10 DIAGNOSIS — E1169 Type 2 diabetes mellitus with other specified complication: Secondary | ICD-10-CM | POA: Diagnosis present

## 2020-03-10 DIAGNOSIS — E870 Hyperosmolality and hypernatremia: Secondary | ICD-10-CM | POA: Diagnosis not present

## 2020-03-10 DIAGNOSIS — Z7901 Long term (current) use of anticoagulants: Secondary | ICD-10-CM

## 2020-03-10 DIAGNOSIS — I361 Nonrheumatic tricuspid (valve) insufficiency: Secondary | ICD-10-CM | POA: Diagnosis not present

## 2020-03-10 DIAGNOSIS — G8194 Hemiplegia, unspecified affecting left nondominant side: Secondary | ICD-10-CM | POA: Diagnosis present

## 2020-03-10 DIAGNOSIS — I4510 Unspecified right bundle-branch block: Secondary | ICD-10-CM | POA: Diagnosis present

## 2020-03-10 DIAGNOSIS — F0151 Vascular dementia with behavioral disturbance: Secondary | ICD-10-CM | POA: Diagnosis not present

## 2020-03-10 DIAGNOSIS — G9340 Encephalopathy, unspecified: Secondary | ICD-10-CM

## 2020-03-10 DIAGNOSIS — Z515 Encounter for palliative care: Secondary | ICD-10-CM

## 2020-03-10 DIAGNOSIS — I482 Chronic atrial fibrillation, unspecified: Secondary | ICD-10-CM | POA: Diagnosis present

## 2020-03-10 DIAGNOSIS — N1832 Chronic kidney disease, stage 3b: Secondary | ICD-10-CM | POA: Diagnosis present

## 2020-03-10 DIAGNOSIS — J811 Chronic pulmonary edema: Secondary | ICD-10-CM

## 2020-03-10 DIAGNOSIS — R531 Weakness: Secondary | ICD-10-CM | POA: Diagnosis not present

## 2020-03-10 DIAGNOSIS — Z9181 History of falling: Secondary | ICD-10-CM

## 2020-03-10 DIAGNOSIS — R1310 Dysphagia, unspecified: Secondary | ICD-10-CM | POA: Diagnosis present

## 2020-03-10 DIAGNOSIS — Z9981 Dependence on supplemental oxygen: Secondary | ICD-10-CM

## 2020-03-10 DIAGNOSIS — H5509 Other forms of nystagmus: Secondary | ICD-10-CM | POA: Diagnosis present

## 2020-03-10 DIAGNOSIS — Z79899 Other long term (current) drug therapy: Secondary | ICD-10-CM

## 2020-03-10 DIAGNOSIS — Z961 Presence of intraocular lens: Secondary | ICD-10-CM | POA: Diagnosis present

## 2020-03-10 LAB — COMPREHENSIVE METABOLIC PANEL
ALT: 12 U/L (ref 0–44)
AST: 13 U/L — ABNORMAL LOW (ref 15–41)
Albumin: 3.1 g/dL — ABNORMAL LOW (ref 3.5–5.0)
Alkaline Phosphatase: 149 U/L — ABNORMAL HIGH (ref 38–126)
Anion gap: 9 (ref 5–15)
BUN: 20 mg/dL (ref 8–23)
CO2: 26 mmol/L (ref 22–32)
Calcium: 8.9 mg/dL (ref 8.9–10.3)
Chloride: 109 mmol/L (ref 98–111)
Creatinine, Ser: 1.71 mg/dL — ABNORMAL HIGH (ref 0.44–1.00)
GFR calc Af Amer: 32 mL/min — ABNORMAL LOW (ref >60)
GFR calc non Af Amer: 28 mL/min — ABNORMAL LOW (ref >60)
Glucose, Bld: 153 mg/dL — ABNORMAL HIGH (ref 70–99)
Potassium: 3.5 mmol/L (ref 3.5–5.1)
Sodium: 144 mmol/L (ref 135–145)
Total Bilirubin: 0.3 mg/dL (ref 0.3–1.2)
Total Protein: 6.8 g/dL (ref 6.5–8.1)

## 2020-03-10 LAB — I-STAT CHEM 8, ED
BUN: 19 mg/dL (ref 8–23)
Calcium, Ion: 1.23 mmol/L (ref 1.15–1.40)
Chloride: 108 mmol/L (ref 98–111)
Creatinine, Ser: 1.8 mg/dL — ABNORMAL HIGH (ref 0.44–1.00)
Glucose, Bld: 151 mg/dL — ABNORMAL HIGH (ref 70–99)
HCT: 32 % — ABNORMAL LOW (ref 36.0–46.0)
Hemoglobin: 10.9 g/dL — ABNORMAL LOW (ref 12.0–15.0)
Potassium: 3.5 mmol/L (ref 3.5–5.1)
Sodium: 144 mmol/L (ref 135–145)
TCO2: 28 mmol/L (ref 22–32)

## 2020-03-10 LAB — ETHANOL: Alcohol, Ethyl (B): <10 mg/dL (ref <10)

## 2020-03-10 LAB — CBC
HCT: 33.9 % — ABNORMAL LOW (ref 36.0–46.0)
Hemoglobin: 10.1 g/dL — ABNORMAL LOW (ref 12.0–15.0)
MCH: 26.5 pg (ref 26.0–34.0)
MCHC: 29.8 g/dL — ABNORMAL LOW (ref 30.0–36.0)
MCV: 89 fL (ref 80.0–100.0)
Platelets: 242 10*3/uL (ref 150–400)
RBC: 3.81 MIL/uL — ABNORMAL LOW (ref 3.87–5.11)
RDW: 14.9 % (ref 11.5–15.5)
WBC: 5.3 10*3/uL (ref 4.0–10.5)
nRBC: 0 % (ref 0.0–0.2)

## 2020-03-10 LAB — BLOOD GAS, ARTERIAL
Acid-Base Excess: 2.5 mmol/L — ABNORMAL HIGH (ref 0.0–2.0)
Bicarbonate: 26.2 mmol/L (ref 20.0–28.0)
Drawn by: 22223
FIO2: 28
O2 Saturation: 97.3 %
Patient temperature: 37
pCO2 arterial: 50.5 mmHg — ABNORMAL HIGH (ref 32.0–48.0)
pH, Arterial: 7.356 (ref 7.350–7.450)
pO2, Arterial: 102 mmHg (ref 83.0–108.0)

## 2020-03-10 LAB — APTT: aPTT: 39 seconds — ABNORMAL HIGH (ref 24–36)

## 2020-03-10 LAB — URINALYSIS, ROUTINE W REFLEX MICROSCOPIC
Bilirubin Urine: NEGATIVE
Glucose, UA: NEGATIVE mg/dL
Ketones, ur: NEGATIVE mg/dL
Nitrite: POSITIVE — AB
Protein, ur: 30 mg/dL — AB
RBC / HPF: >50 RBC/hpf — ABNORMAL HIGH (ref 0–5)
Specific Gravity, Urine: 1.024 (ref 1.005–1.030)
pH: 6 (ref 5.0–8.0)

## 2020-03-10 LAB — PROCALCITONIN: Procalcitonin: <0.1 ng/mL

## 2020-03-10 LAB — DIFFERENTIAL
Abs Immature Granulocytes: 0.01 10*3/uL (ref 0.00–0.07)
Basophils Absolute: 0 10*3/uL (ref 0.0–0.1)
Basophils Relative: 1 %
Eosinophils Absolute: 0.1 10*3/uL (ref 0.0–0.5)
Eosinophils Relative: 1 %
Immature Granulocytes: 0 %
Lymphocytes Relative: 25 %
Lymphs Abs: 1.3 10*3/uL (ref 0.7–4.0)
Monocytes Absolute: 0.5 10*3/uL (ref 0.1–1.0)
Monocytes Relative: 9 %
Neutro Abs: 3.4 10*3/uL (ref 1.7–7.7)
Neutrophils Relative %: 64 %

## 2020-03-10 LAB — RAPID URINE DRUG SCREEN, HOSP PERFORMED
Amphetamines: NOT DETECTED
Barbiturates: NOT DETECTED
Benzodiazepines: NOT DETECTED
Cocaine: NOT DETECTED
Opiates: NOT DETECTED
Tetrahydrocannabinol: NOT DETECTED

## 2020-03-10 LAB — MAGNESIUM: Magnesium: 2.1 mg/dL (ref 1.7–2.4)

## 2020-03-10 LAB — PROTIME-INR
INR: 1.5 — ABNORMAL HIGH (ref 0.8–1.2)
Prothrombin Time: 17.9 seconds — ABNORMAL HIGH (ref 11.4–15.2)

## 2020-03-10 LAB — RESPIRATORY PANEL BY RT PCR (FLU A&B, COVID)
Influenza A by PCR: NEGATIVE
Influenza B by PCR: NEGATIVE
SARS Coronavirus 2 by RT PCR: NEGATIVE

## 2020-03-10 LAB — GLUCOSE, CAPILLARY
Glucose-Capillary: 102 mg/dL — ABNORMAL HIGH (ref 70–99)
Glucose-Capillary: 99 mg/dL (ref 70–99)

## 2020-03-10 LAB — CBG MONITORING, ED: Glucose-Capillary: 142 mg/dL — ABNORMAL HIGH (ref 70–99)

## 2020-03-10 LAB — BRAIN NATRIURETIC PEPTIDE: B Natriuretic Peptide: 455 pg/mL — ABNORMAL HIGH (ref 0.0–100.0)

## 2020-03-10 MED ORDER — POTASSIUM CHLORIDE 10 MEQ/100ML IV SOLN
10.0000 meq | INTRAVENOUS | Status: AC
  Administered 2020-03-10 (×2): 10 meq via INTRAVENOUS
  Filled 2020-03-10: qty 100

## 2020-03-10 MED ORDER — VANCOMYCIN HCL 750 MG/150ML IV SOLN
750.0000 mg | INTRAVENOUS | Status: DC
  Administered 2020-03-10: 750 mg via INTRAVENOUS
  Filled 2020-03-10: qty 150

## 2020-03-10 MED ORDER — LEVETIRACETAM IN NACL 500 MG/100ML IV SOLN
500.0000 mg | Freq: Two times a day (BID) | INTRAVENOUS | Status: DC
  Administered 2020-03-11 – 2020-03-14 (×7): 500 mg via INTRAVENOUS
  Filled 2020-03-10 (×7): qty 100

## 2020-03-10 MED ORDER — SODIUM CHLORIDE 0.9 % IV SOLN
2.0000 g | INTRAVENOUS | Status: DC
  Administered 2020-03-10: 2 g via INTRAVENOUS
  Filled 2020-03-10: qty 2

## 2020-03-10 MED ORDER — ACETAMINOPHEN 325 MG PO TABS: 650.0000 mg | ORAL_TABLET | Freq: Four times a day (QID) | ORAL | Status: DC | PRN

## 2020-03-10 MED ORDER — INSULIN ASPART 100 UNIT/ML ~~LOC~~ SOLN
0.0000 [IU] | SUBCUTANEOUS | Status: DC
  Administered 2020-03-13: 2 [IU] via SUBCUTANEOUS

## 2020-03-10 MED ORDER — LEVETIRACETAM IN NACL 1000 MG/100ML IV SOLN
1000.0000 mg | Freq: Once | INTRAVENOUS | Status: AC
  Administered 2020-03-10: 1000 mg via INTRAVENOUS
  Filled 2020-03-10: qty 100

## 2020-03-10 MED ORDER — IOHEXOL 350 MG/ML SOLN: 100.0000 mL | Freq: Once | INTRAVENOUS | Status: DC | PRN

## 2020-03-10 MED ORDER — ACETAMINOPHEN 650 MG RE SUPP: 650.0000 mg | Freq: Four times a day (QID) | RECTAL | Status: DC | PRN

## 2020-03-10 MED ORDER — LABETALOL HCL 5 MG/ML IV SOLN
5.0000 mg | INTRAVENOUS | Status: DC | PRN
  Administered 2020-03-10: 5 mg via INTRAVENOUS
  Filled 2020-03-10 (×2): qty 4

## 2020-03-10 MED ORDER — HEPARIN SODIUM (PORCINE) 5000 UNIT/ML IJ SOLN
5000.0000 [IU] | Freq: Three times a day (TID) | INTRAMUSCULAR | Status: DC
  Administered 2020-03-10 – 2020-03-13 (×7): 5000 [IU] via SUBCUTANEOUS
  Filled 2020-03-10 (×8): qty 1

## 2020-03-10 MED ORDER — CHLORHEXIDINE GLUCONATE CLOTH 2 % EX PADS: 6.0000 | MEDICATED_PAD | Freq: Every day | CUTANEOUS | Status: DC

## 2020-03-10 MED ORDER — FUROSEMIDE 10 MG/ML IJ SOLN
40.0000 mg | Freq: Two times a day (BID) | INTRAMUSCULAR | Status: DC
  Administered 2020-03-10 – 2020-03-12 (×5): 40 mg via INTRAVENOUS
  Filled 2020-03-10 (×6): qty 4

## 2020-03-10 MED ORDER — SODIUM CHLORIDE 0.9 % IV SOLN
1.0000 g | Freq: Once | INTRAVENOUS | Status: AC
  Administered 2020-03-10: 1 g via INTRAVENOUS
  Filled 2020-03-10: qty 10

## 2020-03-10 MED ORDER — ONDANSETRON HCL 4 MG PO TABS: 4.0000 mg | ORAL_TABLET | Freq: Four times a day (QID) | ORAL | Status: DC | PRN

## 2020-03-10 MED ORDER — IOHEXOL 350 MG/ML SOLN
100.0000 mL | Freq: Once | INTRAVENOUS | Status: AC | PRN
  Administered 2020-03-10: 100 mL via INTRAVENOUS

## 2020-03-10 MED ORDER — ONDANSETRON HCL 4 MG/2ML IJ SOLN: 4.0000 mg | Freq: Four times a day (QID) | INTRAMUSCULAR | Status: DC | PRN

## 2020-03-10 NOTE — Progress Notes (Signed)
Pharmacy Antibiotic Note  [name] is a 84 y.o. female admitted on 03/10/2020 with pneumonia.  Pharmacy has been consulted for vanc/cefepime dosing.  Pt with multiple medical hx who was admitted today for left-sided weakness. Head CT was neg for occlusion. CXR suggested some pleural effusion. Vanc/cefepime ordered for PNA.   Scr 1.8>CrCl 27 ml/min Wbc 5.3  Plan: Vanc 750mg  IV q24>>AUC 463, scr 1.8 Cefepime 2g IV q24 Level as needed F/u MRSA PCR  Height: 5\' 5"  (165.1 cm) Weight: 190 lb 4.1 oz (86.3 kg) IBW/kg (Calculated) : 57  Temp (24hrs), Avg:96.3 F (35.7 C), Min:95.2 F (35.1 C), Max:97.4 F (36.3 C)  Recent Labs  Lab 03/10/20 1312  WBC 5.3  CREATININE 1.71*  1.80*    Estimated Creatinine Clearance: 26.6 mL/min (A) (by C-G formula based on SCr of 1.8 mg/dL (H)).    Allergies  Allergen Reactions  . [place] Venom [Bee Venom] Shortness Of Breath and Itching    Antimicrobials this admission: 3/30 vanc>> 3/30 cefepime>>  Dose adjustments this admission:   Microbiology results: 3/30 urine>> 3/30 COVID neg 3/30 flu neg  Onnie Boer, PharmD, BCIDP, AAHIVP, CPP Infectious Disease Pharmacist 03/10/2020 7:47 PM

## 2020-03-10 NOTE — ED Triage Notes (Addendum)
Ems called out for respiratory problems. EMS Found pt drooling, with left sided arm and leg weakness, left facial droop. Pt was normal when she woke up approx 0830 Pt took meds and ate breakfast Able to ambulate to living room and sat on sofa approx.0900. Slurring words at 1000. Per EMS family had been with her all morning.. Unable to stand to go to BR approx 1140 per son.. Pt took eliquis this morning

## 2020-03-10 NOTE — ED Provider Notes (Signed)
[facility] EMERGENCY DEPARTMENT Provider Note   CSN: [id number] Arrival date & time: 03/10/20  1259  An emergency department physician performed an initial assessment on this suspected stroke patient at 1301.  History Chief Complaint  Patient presents with  . Code Stroke    [name] is a 84 y.o. female.  HPI She presents for evaluation of weakness.  I saw the patient on arrival at 12:59 PM.  Patient last known normal at "noon," when she was able to move and talk.  Since that time she has had decreased ability to move the left side of her body. CBG, according to EMS "200."  Blood pressure was not elevated.  Level 5 caveat-patient unable to give history    Past Medical History:  Diagnosis Date  . Asthma   . Bronchitis   . Carotid artery disease ([place])   . Coronary atherosclerosis of native coronary artery    Nonobstructive at cath 2005  . DM2 (diabetes mellitus, type 2) ([place])   . Essential hypertension   . Gout   . History of stroke   . Hypothyroidism   . Influenza 2013  . Nonischemic cardiomyopathy (HCC)    LVEF 20-25%  . PAF (paroxysmal atrial fibrillation) ([place])    a. diagnosed in 04/2018. Started on Eliquis for anticoagulation.   . Renal artery stenosis [facility])    Bilateral    Patient Active Problem List   Diagnosis Date Noted  . Aspiration pneumonia of lower lobe ([place])   . Urinary tract infection without hematuria   . Acute metabolic encephalopathy 0000000  . Syncope and collapse 05/17/2018  . Chronic combined systolic and diastolic CHF (congestive heart failure) ([place]) 05/16/2018  . Acute renal failure superimposed on stage 3 chronic kidney disease ([place]) 05/16/2018  . Atrial fibrillation, chronic ([place]) 05/14/2018  . Atrial fibrillation with RVR ([place]) 04/24/2018  . Hypokalemia 04/24/2018  . Acute exacerbation of CHF (congestive heart failure) ([place]) 09/01/2015  . CHF (congestive heart failure) ([place]) 07/02/2015  . Elevated troponin I level 07/02/2015    . Right bundle branch block 07/02/2015  . Renal insufficiency 07/02/2015  . CAP (community acquired pneumonia) 04/25/2015  . Acute respiratory failure with hypoxia ([place]) 04/25/2015  . Acute renal injury ([place]) 04/25/2015  . Type 2 diabetes with nephropathy ([place]) 04/25/2015  . ARF (acute renal failure) ([place]) 06/18/2014  . Mitral regurgitation 06/09/2014  . Dyspnea 06/06/2014  . Asthma exacerbation 06/06/2014  . Acute on chronic combined systolic and diastolic CHF (congestive heart failure) ([place]) 06/06/2014  . Influenza 12/10/2012  . Type 2 diabetes mellitus with hyperlipidemia ([place]) 07/08/2010  . Essential hypertension 04/06/2010  . CORONARY ATHEROSCLEROSIS NATIVE CORONARY ARTERY 04/06/2010  . CARDIOMYOPATHY 03/29/2010  . CVA (cerebral vascular accident) ([place]) 03/29/2010    Past Surgical History:  Procedure Laterality Date  . ABDOMINAL HYSTERECTOMY    . CATARACT EXTRACTION W/PHACO Left 01/18/2019   Procedure: CATARACT EXTRACTION PHACO AND INTRAOCULAR LENS PLACEMENT ([place]);  Surgeon: Baruch Goldmann, MD;  Location: [facility];  Service: Ophthalmology;  Laterality: Left;  CDE: 11.99  . CATARACT EXTRACTION W/PHACO Right 02/26/2019   Procedure: CATARACT EXTRACTION PHACO AND INTRAOCULAR LENS PLACEMENT RIGHT EYE (CDE: 13.46);  Surgeon: Baruch Goldmann, MD;  Location: [facility] OR[facility];  Service: Ophthalmology;  Laterality: Right;     OB History    Gravida  15   Para  15   Term  15   Preterm      AB      Living  SAB      TAB      Ectopic      Multiple      Live Births              Family History  Problem Relation Age of Onset  . Asthma Son     Social History   Tobacco Use  . Smoking status: Never Smoker  . Smokeless tobacco: Never Used  Substance Use Topics  . Alcohol use: No    Alcohol/week: 0.0 standard drinks  . Drug use: No    Home Medications Prior to Admission medications   Medication Sig Start Date End Date Taking? Authorizing Provider  allopurinol  (ZYLOPRIM) 100 MG tablet Take 100 mg by mouth daily.  07/01/17   [provider]  atorvastatin (LIPITOR) 20 MG tablet Take 1 tablet (20 mg total) by mouth daily at 6 PM. Patient taking differently: Take 20 mg by mouth daily at 12 noon.  07/09/18   Satira Sark, MD  carvedilol (COREG) 12.5 MG tablet TAKE 1 TABLET(12.5 MG) BY MOUTH TWICE DAILY WITH A MEAL Patient taking differently: Take 12.5 mg by mouth 2 (two) times daily with a meal.  05/15/19   Satira Sark, MD  cholecalciferol (VITAMIN D) 25 MCG (1000 UT) tablet Take 1,000 Units by mouth 2 (two) times daily.    [provider]  donepezil (ARICEPT) 10 MG tablet Take 10 mg by mouth daily.    [provider]  ELIQUIS 2.5 MG TABS tablet TAKE ONE TABLET BY MOUTH TWICE A DAY 03/10/20   Satira Sark, MD  fluticasone [facility]) 50 MCG/ACT nasal spray Place 1 spray into both nostrils daily as needed for allergies or rhinitis.  01/03/19   [provider]  furosemide (LASIX) 40 MG tablet Take 1.5 tablets (60 mg total) by mouth daily. 01/02/20   Barton Dubois, MD  gabapentin (NEURONTIN) 100 MG capsule Take 100 mg by mouth 3 (three) times daily.     [provider]  hydrALAZINE (APRESOLINE) 50 MG tablet Take 1.5 tablets (75 mg total) by mouth every 8 (eight) hours. 04/27/18   Barton Dubois, MD  isosorbide mononitrate (IMDUR) 30 MG 24 hr tablet Take 1 tablet (30 mg total) by mouth daily. 09/16/19   Satira Sark, MD  levETIRAcetam (KEPPRA) 500 MG tablet Take 1 tablet (500 mg total) by mouth 2 (two) times daily. 01/01/20   Barton Dubois, MD  OXYGEN Inhale 2 L into the lungs daily as needed (for shortness of breath).    [provider]  pioglitazone (ACTOS) 15 MG tablet Take 15 mg by mouth daily with breakfast.  08/12/17   [provider]  sitaGLIPtin (JANUVIA) 50 MG tablet Take 50 mg by mouth daily.    [provider]    Allergies    Yellow jacket venom [bee venom]  Review  of Systems   Review of Systems  Unable to perform ROS: Acuity of condition    Physical Exam Updated Vital Signs BP (!) 166/97   Pulse 70   Temp (!) 95.2 F (35.1 C) (Rectal)   Resp (!) 29   Ht 5\' 4"  (1.626 m)   Wt 90.7 kg   SpO2 96%   BMI 34.33 kg/m   Physical Exam Vitals and nursing note reviewed.  Constitutional:      General: She is in acute distress.     Appearance: She is well-developed. She is ill-appearing. She is not toxic-appearing or diaphoretic.  HENT:  Head: Normocephalic and atraumatic.     Right Ear: External ear normal.     Left Ear: External ear normal.     Nose: No congestion.  Eyes:     Conjunctiva/sclera: Conjunctivae normal.     Pupils: Pupils are equal, round, and reactive to light.  Neck:     Trachea: Phonation normal.  Cardiovascular:     Rate and Rhythm: Normal rate and regular rhythm.     Heart sounds: Normal heart sounds.  Pulmonary:     Effort: Pulmonary effort is normal. No respiratory distress.     Breath sounds: Normal breath sounds. No stridor.  Abdominal:     General: There is no distension.     Palpations: Abdomen is soft.     Tenderness: There is no abdominal tenderness.  Musculoskeletal:        General: Normal range of motion.     Cervical back: Normal range of motion and neck supple.  Skin:    General: Skin is warm and dry.     Coloration: Skin is not jaundiced or pale.  Neurological:     Mental Status: She is alert.     Cranial Nerves: No cranial nerve deficit.     Sensory: No sensory deficit.     Motor: No abnormal muscle tone.     Coordination: Coordination normal.     Comments: Alert, responsive, follows commands accurately.  Dysarthria present.  Unable to assess for aphasia.  No gaze discrepancy.  Possible right beating nystagmus, with disconjugate gaze, right eye, laterally, but corrects with eye movement.  Left facial weakness present.  Left arm weaker than right, 2/5 versus 5/5.  Left leg weakness 1/5 versus 4/5  right.  Psychiatric:        Mood and Affect: Mood normal.        Behavior: Behavior normal.     ED Results / Procedures / Treatments   Labs (all labs ordered are listed, but only abnormal results are displayed) Labs Reviewed  PROTIME-INR - Abnormal; Notable for the following components:      Result Value   Prothrombin Time 17.9 (*)    INR 1.5 (*)    All other components within normal limits  APTT - Abnormal; Notable for the following components:   aPTT 39 (*)    All other components within normal limits  CBC - Abnormal; Notable for the following components:   RBC 3.81 (*)    Hemoglobin 10.1 (*)    HCT 33.9 (*)    MCHC 29.8 (*)    All other components within normal limits  COMPREHENSIVE METABOLIC PANEL - Abnormal; Notable for the following components:   Glucose, Bld 153 (*)    Creatinine, Ser 1.71 (*)    Albumin 3.1 (*)    AST 13 (*)    Alkaline Phosphatase 149 (*)    GFR calc non Af Amer 28 (*)    GFR calc Af Amer 32 (*)    All other components within normal limits  I-STAT CHEM 8, ED - Abnormal; Notable for the following components:   Creatinine, Ser 1.80 (*)    Glucose, Bld 151 (*)    Hemoglobin 10.9 (*)    HCT 32.0 (*)    All other components within normal limits  CBG MONITORING, ED - Abnormal; Notable for the following components:   Glucose-Capillary 142 (*)    All other components within normal limits  RESPIRATORY PANEL BY RT PCR (FLU A&B, COVID)  ETHANOL  DIFFERENTIAL  RAPID URINE DRUG SCREEN, HOSP PERFORMED  URINALYSIS, ROUTINE W REFLEX MICROSCOPIC    EKG EKG Interpretation  Date/Time:  Tuesday March 10 2020 13:04:52 EDT Ventricular Rate:  70 PR Interval:    QRS Duration: 145 QT Interval:  478 QTC Calculation: 516 R Axis:   62 Text Interpretation: Sinus rhythm with 1st degree A-V block Right bundle branch block Baseline wander Since last tracing PR interval is longer and new Right bundle branch block Otherwise no significant change Confirmed by Daleen Bo [phone]) on 03/10/2020 1:20:55 PM   Radiology CT Angio Head W or Wo Contrast  Result Date: 03/10/2020 CLINICAL DATA:  Left-sided weakness, code stroke follow-up EXAM: CT ANGIOGRAPHY HEAD AND NECK CT PERFUSION BRAIN TECHNIQUE: Multidetector CT imaging of the head and neck was performed using the standard protocol during bolus administration of intravenous contrast. Multiplanar CT image reconstructions and MIPs were obtained to evaluate the vascular anatomy. Carotid stenosis measurements (when applicable) are obtained utilizing NASCET criteria, using the distal internal carotid diameter as the denominator. Multiphase CT imaging of the brain was performed following IV bolus contrast injection. Subsequent parametric perfusion maps were calculated using RAPID software. CONTRAST:  116mL OMNIPAQUE IOHEXOL 350 MG/ML SOLN COMPARISON:  MRA head 07/12/1920 FINDINGS: CTA NECK FINDINGS Aortic arch: Mild calcified plaque along the aortic arch and great vessel origins, which are patent. Right carotid system: Patent. There is primarily calcified plaque at the common carotid bifurcation and proximal internal carotid causing mild less than 50% stenosis. Left carotid system: Patent. There is calcified plaque at the ICA origin causing mild, less than 50% stenosis. Vertebral arteries: Patent. Left vertebral artery is dominant. There is calcified plaque at the origins without high-grade stenosis. Severe stenosis of the right V1 segment with reconstitution. Skeleton: Multilevel degenerative changes of the cervical spine. Other neck: No mass or adenopathy. Upper chest: Small bilateral pleural effusions. Review of the MIP images confirms the above findings CTA HEAD FINDINGS Anterior circulation: Intracranial internal carotid arteries are patent with calcified plaque causing moderate stenosis. Middle and anterior cerebral arteries are patent. There is multifocal atherosclerosis. For example, short segment moderate to severe  stenosis of the left A2 ACA, mild stenosis of the right M1 MCA, and multiple mild stenoses more distally. Posterior circulation: Intracranial vertebral arteries are patent. Right vertebral artery becomes small beyond PICA origin with superimposed plaque causing moderate to marked stenosis. Calcified plaque is present along the left vertebral artery causing mild stenosis. Diffuse irregularity and moderate stenosis of the basilar artery. Posterior cerebral arteries are patent with diffuse atherosclerotic irregularity and superimposed stenoses ranging from mild to marked, left greater than right. Venous sinuses: As permitted by contrast timing, patent. Review of the MIP images confirms the above findings CT Brain Perfusion Findings: CBF (<30%) Volume: 30mL Perfusion (Tmax>6.0s) volume: 55mL Mismatch Volume: 67mL Infarction Location:None IMPRESSION: No large vessel occlusion. Perfusion imaging identifies no evidence of core infarction or penumbra. Plaque at the ICA origins with less than 50% stenosis. High-grade stenosis of the non-dominant proximal right vertebral artery with reconstitution. Multifocal intracranial atherosclerosis involving anterior and posterior circulations. Partially imaged small bilateral pleural effusions. Initial results were called by telephone at the time of interpretation on 03/10/2020 at 1:59 pm to provider [facility] , who verbally acknowledged these results. Electronically Signed   By: Macy Mis M.D.   On: 03/10/2020 14:05   CT Angio Neck W and/or Wo Contrast  Result Date: 03/10/2020 CLINICAL DATA:  Left-sided weakness, code stroke follow-up EXAM: CT ANGIOGRAPHY HEAD AND  NECK CT PERFUSION BRAIN TECHNIQUE: Multidetector CT imaging of the head and neck was performed using the standard protocol during bolus administration of intravenous contrast. Multiplanar CT image reconstructions and MIPs were obtained to evaluate the vascular anatomy. Carotid stenosis measurements (when applicable)  are obtained utilizing NASCET criteria, using the distal internal carotid diameter as the denominator. Multiphase CT imaging of the brain was performed following IV bolus contrast injection. Subsequent parametric perfusion maps were calculated using RAPID software. CONTRAST:  168mL OMNIPAQUE IOHEXOL 350 MG/ML SOLN COMPARISON:  MRA head 07/12/1920 FINDINGS: CTA NECK FINDINGS Aortic arch: Mild calcified plaque along the aortic arch and great vessel origins, which are patent. Right carotid system: Patent. There is primarily calcified plaque at the common carotid bifurcation and proximal internal carotid causing mild less than 50% stenosis. Left carotid system: Patent. There is calcified plaque at the ICA origin causing mild, less than 50% stenosis. Vertebral arteries: Patent. Left vertebral artery is dominant. There is calcified plaque at the origins without high-grade stenosis. Severe stenosis of the right V1 segment with reconstitution. Skeleton: Multilevel degenerative changes of the cervical spine. Other neck: No mass or adenopathy. Upper chest: Small bilateral pleural effusions. Review of the MIP images confirms the above findings CTA HEAD FINDINGS Anterior circulation: Intracranial internal carotid arteries are patent with calcified plaque causing moderate stenosis. Middle and anterior cerebral arteries are patent. There is multifocal atherosclerosis. For example, short segment moderate to severe stenosis of the left A2 ACA, mild stenosis of the right M1 MCA, and multiple mild stenoses more distally. Posterior circulation: Intracranial vertebral arteries are patent. Right vertebral artery becomes small beyond PICA origin with superimposed plaque causing moderate to marked stenosis. Calcified plaque is present along the left vertebral artery causing mild stenosis. Diffuse irregularity and moderate stenosis of the basilar artery. Posterior cerebral arteries are patent with diffuse atherosclerotic irregularity and  superimposed stenoses ranging from mild to marked, left greater than right. Venous sinuses: As permitted by contrast timing, patent. Review of the MIP images confirms the above findings CT Brain Perfusion Findings: CBF (<30%) Volume: 75mL Perfusion (Tmax>6.0s) volume: 36mL Mismatch Volume: 80mL Infarction Location:None IMPRESSION: No large vessel occlusion. Perfusion imaging identifies no evidence of core infarction or penumbra. Plaque at the ICA origins with less than 50% stenosis. High-grade stenosis of the non-dominant proximal right vertebral artery with reconstitution. Multifocal intracranial atherosclerosis involving anterior and posterior circulations. Partially imaged small bilateral pleural effusions. Initial results were called by telephone at the time of interpretation on 03/10/2020 at 1:59 pm to provider [facility] , who verbally acknowledged these results. Electronically Signed   By: Macy Mis M.D.   On: 03/10/2020 14:05   MR BRAIN WO CONTRAST  Result Date: 03/10/2020 CLINICAL DATA:  Encephalopathy EXAM: MRI HEAD WITHOUT CONTRAST TECHNIQUE: Multiplanar, multiecho pulse sequences of the brain and surrounding structures were obtained without intravenous contrast. COMPARISON:  None. FINDINGS: Attempted sequences are highly motion degraded. Diffusion sequence is nondiagnostic. IMPRESSION: Nondiagnostic study. Recommend repeating when possible with sedation. Electronically Signed   By: Macy Mis M.D.   On: 03/10/2020 15:18   CT CEREBRAL PERFUSION W CONTRAST  Result Date: 03/10/2020 CLINICAL DATA:  Left-sided weakness, code stroke follow-up EXAM: CT ANGIOGRAPHY HEAD AND NECK CT PERFUSION BRAIN TECHNIQUE: Multidetector CT imaging of the head and neck was performed using the standard protocol during bolus administration of intravenous contrast. Multiplanar CT image reconstructions and MIPs were obtained to evaluate the vascular anatomy. Carotid stenosis measurements (when applicable) are  obtained utilizing NASCET criteria,  using the distal internal carotid diameter as the denominator. Multiphase CT imaging of the brain was performed following IV bolus contrast injection. Subsequent parametric perfusion maps were calculated using RAPID software. CONTRAST:  122mL OMNIPAQUE IOHEXOL 350 MG/ML SOLN COMPARISON:  MRA head 07/12/1920 FINDINGS: CTA NECK FINDINGS Aortic arch: Mild calcified plaque along the aortic arch and great vessel origins, which are patent. Right carotid system: Patent. There is primarily calcified plaque at the common carotid bifurcation and proximal internal carotid causing mild less than 50% stenosis. Left carotid system: Patent. There is calcified plaque at the ICA origin causing mild, less than 50% stenosis. Vertebral arteries: Patent. Left vertebral artery is dominant. There is calcified plaque at the origins without high-grade stenosis. Severe stenosis of the right V1 segment with reconstitution. Skeleton: Multilevel degenerative changes of the cervical spine. Other neck: No mass or adenopathy. Upper chest: Small bilateral pleural effusions. Review of the MIP images confirms the above findings CTA HEAD FINDINGS Anterior circulation: Intracranial internal carotid arteries are patent with calcified plaque causing moderate stenosis. Middle and anterior cerebral arteries are patent. There is multifocal atherosclerosis. For example, short segment moderate to severe stenosis of the left A2 ACA, mild stenosis of the right M1 MCA, and multiple mild stenoses more distally. Posterior circulation: Intracranial vertebral arteries are patent. Right vertebral artery becomes small beyond PICA origin with superimposed plaque causing moderate to marked stenosis. Calcified plaque is present along the left vertebral artery causing mild stenosis. Diffuse irregularity and moderate stenosis of the basilar artery. Posterior cerebral arteries are patent with diffuse atherosclerotic irregularity and  superimposed stenoses ranging from mild to marked, left greater than right. Venous sinuses: As permitted by contrast timing, patent. Review of the MIP images confirms the above findings CT Brain Perfusion Findings: CBF (<30%) Volume: 75mL Perfusion (Tmax>6.0s) volume: 23mL Mismatch Volume: 57mL Infarction Location:None IMPRESSION: No large vessel occlusion. Perfusion imaging identifies no evidence of core infarction or penumbra. Plaque at the ICA origins with less than 50% stenosis. High-grade stenosis of the non-dominant proximal right vertebral artery with reconstitution. Multifocal intracranial atherosclerosis involving anterior and posterior circulations. Partially imaged small bilateral pleural effusions. Initial results were called by telephone at the time of interpretation on 03/10/2020 at 1:59 pm to provider [facility] , who verbally acknowledged these results. Electronically Signed   By: Macy Mis M.D.   On: 03/10/2020 14:05   DG Chest Port 1 View  Result Date: 03/10/2020 CLINICAL DATA:  Tachypnea EXAM: PORTABLE CHEST 1 VIEW COMPARISON:  December 28, 2019 FINDINGS: The right-sided PICC line has been removed. The heart size is enlarged. The pulmonary vasculature is dilated. Hazy bilateral airspace opacities are noted with prominent interstitial lung markings and Kerley B lines. There is consolidation versus atelectasis at the left lung base. Small bilateral pleural effusions are noted. Aortic calcifications are noted. IMPRESSION: Cardiomegaly with pulmonary vascular congestion and pulmonary edema. Small bilateral pleural effusions. Consolidation versus atelectasis at the left lung base. Dilated pulmonary arteries which can be seen in patients with elevated pulmonary artery pressures. Electronically Signed   By: Constance Holster M.D.   On: 03/10/2020 14:56   CT HEAD CODE STROKE WO CONTRAST  Result Date: 03/10/2020 CLINICAL DATA:  Code stroke. Acute onset of left upper and lower extremity weakness  and left facial droop as well as slurred speech beginning 1 hour ago. EXAM: CT HEAD WITHOUT CONTRAST TECHNIQUE: Contiguous axial images were obtained from the base of the skull through the vertex without intravenous contrast. COMPARISON:  CT head  without contrast 12/26/2019 and MR head without contrast 12/31/2019. FINDINGS: Brain: Chronic encephalomalacia of the right frontal lobe white matter is stable. Diffuse white matter hypoattenuation is present bilaterally. Remote medial left occipital lobe infarct is present. Moderate generalized atrophy is noted. The ventricles are proportionate to the degree of atrophy. A remote posterior left cerebellar infarct is stable. Remote lacunar infarcts of the right cerebellum are stable. The brainstem is otherwise unremarkable. Vascular: Dense atherosclerotic calcifications are again noted within the cavernous internal carotid arteries bilaterally as well as the V3 segments of both vertebral arteries. No hyperdense vessel is present. Skull: Calvarium is intact. No focal lytic or blastic lesions are present. No significant extracranial soft tissue lesion is present. Sinuses/Orbits: The paranasal sinuses and mastoid air cells are clear. The globes and orbits are within normal limits. ASPECTS [facility] Stroke Program Early CT Score) - Ganglionic level infarction (caudate, lentiform nuclei, internal capsule, insula, M1-M3 cortex): 7/7 - Supraganglionic infarction (M4-M6 cortex): 3/3 Total score (0-10 with 10 being normal): 10/10 IMPRESSION: 1. No acute intracranial abnormality or significant interval change. 2. Stable chronic encephalomalacia involving the right frontal white matter, the medial left occipital lobe, and posterior left cerebellum. 3. Moderate generalized atrophy and white matter disease likely reflects the sequela of chronic microvascular ischemia. 4. Stable remote lacunar infarcts of the right cerebellum and bilateral basal ganglia. 5. ASPECTS is 10/10 6. Extensive  atherosclerotic disease without a hyperdense vessel. These results were called by telephone at the time of interpretation on 03/10/2020 at 1:26 pm to provider [facility] , who verbally acknowledged these results. Electronically Signed   By: San Morelle M.D.   On: 03/10/2020 13:28    Procedures .Critical Care Performed by: Daleen Bo, MD Authorized by: Daleen Bo, MD   Critical care provider statement:    Critical care time (minutes):  35   Critical care start time:  03/10/2020 12:59 PM   Critical care end time:  03/10/2020 3:34 PM   Critical care time was exclusive of:  Separately billable procedures and treating other patients   Critical care was necessary to treat or prevent imminent or life-threatening deterioration of the following conditions:  CNS failure or compromise   Critical care was time spent personally by me on the following activities:  Blood draw for specimens, development of treatment plan with patient or surrogate, discussions with consultants, evaluation of patient's response to treatment, examination of patient, obtaining history from patient or surrogate, ordering and performing treatments and interventions, ordering and review of laboratory studies, pulse oximetry, re-evaluation of patient's condition, review of old charts and ordering and review of radiographic studies   (including critical care time)  Medications Ordered in ED Medications  iohexol (OMNIPAQUE) 350 MG/ML injection 100 mL (has no administration in time range)  iohexol (OMNIPAQUE) 350 MG/ML injection 100 mL (100 mLs Intravenous Contrast Given 03/10/20 1337)  levETIRAcetam (KEPPRA) IVPB 1000 mg/100 mL premix (1,000 mg Intravenous New Bag/Given 03/10/20 1438)    ED Course  I have reviewed the triage vital signs and the nursing notes.  Pertinent labs & imaging results that were available during my care of the patient were reviewed by me and considered in my medical decision making (see chart  for details).  Clinical Course as of Mar 10 1541  Tue Mar 10, 2020  1314 Code stroke initiated at 12:01 PM   [EW]  82 Patient son arrived and now describes last known normal as 9 AM.  Initially she had trouble talking and was mildly  weak.  After that she gradually got worse, and ultimately was unable to stand up and walk at 11:40 AM so EMS was called.   [EW]  1407 Discussed case with teleneurology, Dr. Karie Schwalbe.  She states that patient requires evaluation for nonspecific weakness with possible encephalopathy versus seizure, as cause of weakness.  MRI imaging indicated.   [EW]  1413 Reevaluation-she remains alert and cooperative.  Eyes partially closed, when I lifted the lids she had bilateral right beating nystagmus, and was able to follow finger with gaze across the midline.  Possible jaw mastication, impaction, at this time.  Continues to have left facial asymmetry, and weakness of left arm and left leg. IV Keppra ordered.   [EW]  1534 Normal except glucose high, creatinine high, albumin low, AST low, alkaline phosphatase high, GFR low  Comprehensive metabolic panel(!) [EW]  [id number] Normal  Ethanol [EW]  1535 Normal except creatinine high, glucose high  I-stat chem 8, ED(!) [EW]  1535 Normal except hemoglobin low  CBC(!) [EW]  1535 Consistent with mild CHF, interpreted by me  DG Chest Port 1 View [EW]  1536 Per radiology, nondiagnostic  MR BRAIN WO CONTRAST [EW]  1536 CT images read by radiology-noncontrast head, no CVA; CT perfusion normal; CTA neck and head, no large vessel occlusion.   [EW]    Clinical Course User Index [EW] Daleen Bo, MD   MDM Rules/Calculators/A&P                       Patient Vitals for the past 24 hrs:  BP Temp Temp src Pulse Resp SpO2 Height Weight  03/10/20 1417 -- (!) 95.2 F (35.1 C) Rectal -- -- -- -- --  03/10/20 1406 -- -- Axillary -- -- -- -- --  03/10/20 1345 (!) 166/97 -- -- 70 (!) 29 96 % -- --  03/10/20 1330 (!) 146/91 -- -- 68 -- 92  % -- --  03/10/20 1325 -- -- -- 67 -- 92 % -- --  03/10/20 1324 -- -- -- 67 -- 92 % -- --  03/10/20 1323 -- -- -- 71 -- 97 % -- --  03/10/20 1322 -- -- -- 70 -- 91 % -- --  03/10/20 1321 (!) 168/126 -- -- 69 -- 93 % -- --  03/10/20 1317 -- -- -- -- -- -- 5\' 4"  (1.626 m) 90.7 kg  03/10/20 1315 -- -- -- 70 (!) 27 97 % -- --  03/10/20 1314 -- -- -- -- (!) 30 -- -- --  03/10/20 1313 -- -- -- -- (!) 36 -- -- --  03/10/20 1312 -- -- -- -- (!) 29 -- -- --  03/10/20 1311 -- -- -- -- (!) 34 -- -- --  03/10/20 1310 -- -- -- -- (!) 32 -- -- --  03/10/20 1309 -- -- -- -- (!) 33 -- -- --  03/10/20 1308 -- -- -- -- (!) 35 -- -- --  03/10/20 1307 -- -- -- -- (!) 33 -- -- --  03/10/20 1306 -- -- -- 69 (!) 29 97 % -- --    3:36 PM Reevaluation with update and discussion. After initial assessment and treatment, an updated evaluation reveals patient is clinically unchanged, continues to be responsive and follow commands.  Continues to have right side gaze preference with nystagmus right side.  Continues to have left face left arm and left leg weakness. Daleen Bo   Medical Decision Making: Patient with acute altered mental status, decreased ability  to speak and move, and abnormal eye movements.  Initial evaluation is not consistent with stroke.  MRI imaging is nondiagnostic for acute infarct.  Consideration for seizures causing encephalopathy, with Keppra started in the ED empirically.  Urinalysis pending.  Additional ED evaluation and disposition will be arranged by oncoming provider team.  Local neurology consultation is to be arranged.  [name] was evaluated in Emergency Department on 03/10/2020 for the symptoms described in the history of present illness. She was evaluated in the context of the global COVID-19 pandemic, which necessitated consideration that the patient might be at risk for infection with the SARS-CoV-2 virus that causes COVID-19. Institutional protocols and algorithms that pertain  to the evaluation of patients at risk for COVID-19 are in a state of rapid change based on information released by regulatory bodies including the CDC and federal and state organizations. These policies and algorithms were followed during the patient's care in the ED.   CRITICAL CARE-yes Performed by: Daleen Bo   Nursing Notes Reviewed/ Care Coordinated Applicable Imaging Reviewed Interpretation of Laboratory Data incorporated into ED treatment   Plan:-Admit by oncoming provider team  Final Clinical Impression(s) / ED Diagnoses Final diagnoses:  Left-sided weakness  Encephalopathy  Nystagmus    Rx / DC Orders ED Discharge Orders    None       Daleen Bo, MD 03/10/20 1542

## 2020-03-10 NOTE — ED Notes (Signed)
CODE STROKE PAGED @ 1301

## 2020-03-10 NOTE — H&P (Addendum)
History and Physical    [name] [id number] DOB: 1936/07/20 DOA: 03/10/2020  PCP: Jani Gravel, MD   Patient coming from: Home  I have personally briefly reviewed patient's old medical records in [place]  Chief Complaint: Left-sided weakness.  HPI: [name] is a 84 y.o. female with medical history significant for atrial fibrillation, diabetes mellitus, hypertension, coronary artery disease, systolic and diastolic CHF, CVA. History is obtained from chart review and patient's daughter, at the time of my evaluation patient is somnolent and unable to give me history. Patient was brought to the ED via EMS.  Per triage note EMS was called out for respiratory problems.  EMS found patient to be drooling with left-sided upper and lower extremity weakness, left facial droop. Patient woke up this morning at about 8:30 AM, she was normal took her medications to eat her breakfast, she was able to ambulate, start on so far at about 9 AM.  Family noticed that patient started slurring her words at about 10:00.  Was also unable to stand to go to the bathroom.  Patient is on Eliquis and last took her medications this morning.  Spoke with patient's daughter-Ms. Jobe Gibbon.  Patient no longer lives with her.  She does not have the contact number for patients on home patient lives with.  She reports she noticed lower extremity swelling about a week ago, usually when patient is compliant with her Lasix this improves.  She is unaware if patient has been compliant with all her medications.  She also reports patient ambulates with a walker at baseline, but is able to move all her extremities.  She reports baseline dementia-  patient has good and bad days, sometimes patient is not able to recognize family or answer simple questions accurately.   Hospitalization 1/14-1/24 acute encephalopathy secondary to pansensitive E. coli UTI and pneumonia.  At this time CT and MRI negative for acute  abnormality, EEG without seizure activity.  Patient was started empirically on Keppra twice daily to follow-up as an outpatient.  ED Course: Temperature 95.2.  Systolic up blood pressure 130s to 170s,.  Initial tachypnea resolved.  O2 sats greater than 92% on 2 L nasal cannula.  WBC 5.3.  Creatinine 1.7 about baseline.  Mildly elevated ALP 149.  UA suggestive of UTI. Head CT negative for acute abnormality, shows stable chronic encephalomalacia involving the right frontal white matter, medial left occipital lobe and posterior left cerebellum, moderate generalized atrophy, stable remote lacunar basal ganglia infarcts. CTA head and neck cerebral perfusion study-no evidence of cord infarction or penumbra, no large vessel occlusion.Plaque at the ICA origins with less than 50% stenosis. High-grad stenosis of the non-dominant proximal right vertebral artery with reconstitution.  Telemetry neurology was consulted, impression-seizure, encephalopathy-metabolic, infectious cause, recrudescence of old stroke symptoms.  Subsequent MRI in ED nondiagnostic, highly motion degraded, repeat recommended with sedation.  Portable chest x-ray showed cardiomegaly with pulmonary vascular congestion and pulmonary edema, small bilateral pleural effusions, consolidation versus atelectasis at the left lung base.  Hospitalist admit for further evaluation and management.  Review of Systems: Unable to ascertain due to altered mental status.  Past Medical History:  Diagnosis Date  . Asthma   . Bronchitis   . Carotid artery disease ([place])   . Coronary atherosclerosis of native coronary artery    Nonobstructive at cath 2005  . DM2 (diabetes mellitus, type 2) ([place])   . Essential hypertension   . Gout   . History of stroke   .  Hypothyroidism   . Influenza 2013  . Nonischemic cardiomyopathy (HCC)    LVEF 20-25%  . PAF (paroxysmal atrial fibrillation) ([place])    a. diagnosed in 04/2018. Started on Eliquis for anticoagulation.    . Renal artery stenosis (HCC)    Bilateral    Past Surgical History:  Procedure Laterality Date  . ABDOMINAL HYSTERECTOMY    . CATARACT EXTRACTION W/PHACO Left 01/18/2019   Procedure: CATARACT EXTRACTION PHACO AND INTRAOCULAR LENS PLACEMENT ([place]);  Surgeon: Baruch Goldmann, MD;  Location: [facility];  Service: Ophthalmology;  Laterality: Left;  CDE: 11.99  . CATARACT EXTRACTION W/PHACO Right 02/26/2019   Procedure: CATARACT EXTRACTION PHACO AND INTRAOCULAR LENS PLACEMENT RIGHT EYE (CDE: 13.46);  Surgeon: Baruch Goldmann, MD;  Location: [facility];  Service: Ophthalmology;  Laterality: Right;     reports that she has never smoked. She has never used smokeless tobacco. She reports that she does not drink alcohol or use drugs.  Allergies  Allergen Reactions  . [place] Venom [Bee Venom] Shortness Of Breath and Itching    Family History  Problem Relation Age of Onset  . Asthma Son     Prior to Admission medications   Medication Sig Start Date End Date Taking? Authorizing Provider  carvedilol (COREG) 12.5 MG tablet TAKE 1 TABLET(12.5 MG) BY MOUTH TWICE DAILY WITH A MEAL Patient taking differently: Take 12.5 mg by mouth 2 (two) times daily with a meal.  05/15/19  Yes Satira Sark, MD  cholecalciferol (VITAMIN D) 25 MCG (1000 UT) tablet Take 1,000 Units by mouth 2 (two) times daily.   Yes [provider]  donepezil (ARICEPT) 10 MG tablet Take 10 mg by mouth daily.   Yes [provider]  ELIQUIS 2.5 MG TABS tablet TAKE ONE TABLET BY MOUTH TWICE A DAY Patient taking differently: Take 2.5 mg by mouth 2 (two) times daily.  03/10/20  Yes Satira Sark, MD  gabapentin (NEURONTIN) 100 MG capsule Take 100 mg by mouth 3 (three) times daily.    Yes [provider]  hydrALAZINE (APRESOLINE) 50 MG tablet Take 1.5 tablets (75 mg total) by mouth every 8 (eight) hours. 04/27/18  Yes Barton Dubois, MD  isosorbide mononitrate (IMDUR) 30 MG 24 hr tablet Take 1 tablet (30 mg  total) by mouth daily. 09/16/19  Yes Satira Sark, MD  levETIRAcetam (KEPPRA) 500 MG tablet Take 1 tablet (500 mg total) by mouth 2 (two) times daily. 01/01/20  Yes Barton Dubois, MD  pioglitazone (ACTOS) 15 MG tablet Take 15 mg by mouth daily with breakfast.  08/12/17  Yes [provider]  sitaGLIPtin (JANUVIA) 50 MG tablet Take 50 mg by mouth daily.   Yes [provider]  allopurinol (ZYLOPRIM) 100 MG tablet Take 100 mg by mouth daily.  07/01/17   [provider]  atorvastatin (LIPITOR) 20 MG tablet Take 1 tablet (20 mg total) by mouth daily at 6 PM. Patient taking differently: Take 20 mg by mouth daily at 12 noon.  07/09/18   Satira Sark, MD  fluticasone (FLONASE) 50 MCG/ACT nasal spray Place 1 spray into both nostrils daily as needed for allergies or rhinitis.  01/03/19   [provider]  furosemide (LASIX) 40 MG tablet Take 1.5 tablets (60 mg total) by mouth daily. 01/02/20   Barton Dubois, MD  OXYGEN Inhale 2 L into the lungs daily as needed (for shortness of breath).    [provider]    Physical Exam: Exam limited by patient's mental  status. Vitals:   03/10/20 1630 03/10/20 1645 03/10/20 1700 03/10/20 1715  BP: (!) 144/74 139/69 (!) 150/67 (!) 147/77  Pulse: 63 61 64 63  Resp: 20 18 20 18   Temp:      TempSrc:      SpO2: 98% 97% 99% 99%  Weight:      Height:        Constitutional: Somnolent, not following directions Vitals:   03/10/20 1630 03/10/20 1645 03/10/20 1700 03/10/20 1715  BP: (!) 144/74 139/69 (!) 150/67 (!) 147/77  Pulse: 63 61 64 63  Resp: 20 18 20 18   Temp:      TempSrc:      SpO2: 98% 97% 99% 99%  Weight:      Height:       Eyes: PERRL, lids and conjunctivae normal ENMT: Mucous membranes are moist. Neck: normal, supple, no masses, no thyromegaly Respiratory: Anterior auscultation, clear to auscultation bilaterally, no wheezing, no crackles.  Abdominal breathing. Cardiovascular: Regular rate and rhythm,  no murmurs / rubs / gallops.  2+ extremity edema to knees, wearing compression stockings,   Abdomen: no tenderness, no masses palpated. No hepatosplenomegaly. Bowel sounds positive.  Musculoskeletal: no clubbing / cyanosis. No joint deformity upper and lower extremities. Good ROM, no contractures. Normal muscle tone.  Skin: no rashes, lesions, ulcers. No induration Neurologic: Drooling from right side of lip, patient rotated to the right so I am unable to tell if she has right facial droop.Exam limited by patient's mental status, to vigorous touch and sternal rub, patient opens eyes, localizes pain with her right upper extremity, moves right lower extremity, minimal movement to left upper extremity, no movement to right lower extremity Psychiatric: Somnolent, not following directions,  Labs on Admission: I have personally reviewed following labs and imaging studies  CBC: Recent Labs  Lab 03/10/20 1312  WBC 5.3  NEUTROABS 3.4  HGB 10.9*  10.1*  HCT 32.0*  33.9*  MCV 89.0  PLT [id number]   Basic Metabolic Panel: Recent Labs  Lab 03/10/20 1312  NA 144  144  K 3.5  3.5  CL 109  108  CO2 26  GLUCOSE 153*  151*  BUN 20  19  CREATININE 1.71*  1.80*  CALCIUM 8.9   Liver Function Tests: Recent Labs  Lab 03/10/20 1312  AST 13*  ALT 12  ALKPHOS 149*  BILITOT 0.3  PROT 6.8  ALBUMIN 3.1*   Coagulation Profile: Recent Labs  Lab 03/10/20 1312  INR 1.5*   CBG: Recent Labs  Lab 03/10/20 1309  GLUCAP 142*   Urine analysis:    Component Value Date/Time   COLORURINE YELLOW 03/10/2020 1538   APPEARANCEUR CLOUDY (A) 03/10/2020 1538   LABSPEC 1.024 03/10/2020 1538   PHURINE 6.0 03/10/2020 1538   GLUCOSEU NEGATIVE 03/10/2020 1538   HGBUR LARGE (A) 03/10/2020 1538   BILIRUBINUR NEGATIVE 03/10/2020 1538   KETONESUR NEGATIVE 03/10/2020 1538   PROTEINUR 30 (A) 03/10/2020 1538   UROBILINOGEN 0.2 07/09/2014 0553   NITRITE POSITIVE (A) 03/10/2020 1538   LEUKOCYTESUR TRACE (A)  03/10/2020 1538    Radiological Exams on Admission: CT Angio Head W or Wo Contrast  Result Date: 03/10/2020 CLINICAL DATA:  Left-sided weakness, code stroke follow-up EXAM: CT ANGIOGRAPHY HEAD AND NECK CT PERFUSION BRAIN TECHNIQUE: Multidetector CT imaging of the head and neck was performed using the standard protocol during bolus administration of intravenous contrast. Multiplanar CT image reconstructions and MIPs were obtained to evaluate the vascular anatomy. Carotid stenosis measurements (when  applicable) are obtained utilizing NASCET criteria, using the distal internal carotid diameter as the denominator. Multiphase CT imaging of the brain was performed following IV bolus contrast injection. Subsequent parametric perfusion maps were calculated using RAPID software. CONTRAST:  138mL OMNIPAQUE IOHEXOL 350 MG/ML SOLN COMPARISON:  MRA head 07/12/1920 FINDINGS: CTA NECK FINDINGS Aortic arch: Mild calcified plaque along the aortic arch and great vessel origins, which are patent. Right carotid system: Patent. There is primarily calcified plaque at the common carotid bifurcation and proximal internal carotid causing mild less than 50% stenosis. Left carotid system: Patent. There is calcified plaque at the ICA origin causing mild, less than 50% stenosis. Vertebral arteries: Patent. Left vertebral artery is dominant. There is calcified plaque at the origins without high-grade stenosis. Severe stenosis of the right V1 segment with reconstitution. Skeleton: Multilevel degenerative changes of the cervical spine. Other neck: No mass or adenopathy. Upper chest: Small bilateral pleural effusions. Review of the MIP images confirms the above findings CTA HEAD FINDINGS Anterior circulation: Intracranial internal carotid arteries are patent with calcified plaque causing moderate stenosis. Middle and anterior cerebral arteries are patent. There is multifocal atherosclerosis. For example, short segment moderate to severe  stenosis of the left A2 ACA, mild stenosis of the right M1 MCA, and multiple mild stenoses more distally. Posterior circulation: Intracranial vertebral arteries are patent. Right vertebral artery becomes small beyond PICA origin with superimposed plaque causing moderate to marked stenosis. Calcified plaque is present along the left vertebral artery causing mild stenosis. Diffuse irregularity and moderate stenosis of the basilar artery. Posterior cerebral arteries are patent with diffuse atherosclerotic irregularity and superimposed stenoses ranging from mild to marked, left greater than right. Venous sinuses: As permitted by contrast timing, patent. Review of the MIP images confirms the above findings CT Brain Perfusion Findings: CBF (<30%) Volume: 54mL Perfusion (Tmax>6.0s) volume: 74mL Mismatch Volume: 107mL Infarction Location:None IMPRESSION: No large vessel occlusion. Perfusion imaging identifies no evidence of core infarction or penumbra. Plaque at the ICA origins with less than 50% stenosis. High-grade stenosis of the non-dominant proximal right vertebral artery with reconstitution. Multifocal intracranial atherosclerosis involving anterior and posterior circulations. Partially imaged small bilateral pleural effusions. Initial results were called by telephone at the time of interpretation on 03/10/2020 at 1:59 pm to provider [facility] , who verbally acknowledged these results. Electronically Signed   By: Macy Mis M.D.   On: 03/10/2020 14:05   CT Angio Neck W and/or Wo Contrast  Result Date: 03/10/2020 CLINICAL DATA:  Left-sided weakness, code stroke follow-up EXAM: CT ANGIOGRAPHY HEAD AND NECK CT PERFUSION BRAIN TECHNIQUE: Multidetector CT imaging of the head and neck was performed using the standard protocol during bolus administration of intravenous contrast. Multiplanar CT image reconstructions and MIPs were obtained to evaluate the vascular anatomy. Carotid stenosis measurements (when applicable)  are obtained utilizing NASCET criteria, using the distal internal carotid diameter as the denominator. Multiphase CT imaging of the brain was performed following IV bolus contrast injection. Subsequent parametric perfusion maps were calculated using RAPID software. CONTRAST:  148mL OMNIPAQUE IOHEXOL 350 MG/ML SOLN COMPARISON:  MRA head 07/12/1920 FINDINGS: CTA NECK FINDINGS Aortic arch: Mild calcified plaque along the aortic arch and great vessel origins, which are patent. Right carotid system: Patent. There is primarily calcified plaque at the common carotid bifurcation and proximal internal carotid causing mild less than 50% stenosis. Left carotid system: Patent. There is calcified plaque at the ICA origin causing mild, less than 50% stenosis. Vertebral arteries: Patent. Left vertebral artery is  dominant. There is calcified plaque at the origins without high-grade stenosis. Severe stenosis of the right V1 segment with reconstitution. Skeleton: Multilevel degenerative changes of the cervical spine. Other neck: No mass or adenopathy. Upper chest: Small bilateral pleural effusions. Review of the MIP images confirms the above findings CTA HEAD FINDINGS Anterior circulation: Intracranial internal carotid arteries are patent with calcified plaque causing moderate stenosis. Middle and anterior cerebral arteries are patent. There is multifocal atherosclerosis. For example, short segment moderate to severe stenosis of the left A2 ACA, mild stenosis of the right M1 MCA, and multiple mild stenoses more distally. Posterior circulation: Intracranial vertebral arteries are patent. Right vertebral artery becomes small beyond PICA origin with superimposed plaque causing moderate to marked stenosis. Calcified plaque is present along the left vertebral artery causing mild stenosis. Diffuse irregularity and moderate stenosis of the basilar artery. Posterior cerebral arteries are patent with diffuse atherosclerotic irregularity and  superimposed stenoses ranging from mild to marked, left greater than right. Venous sinuses: As permitted by contrast timing, patent. Review of the MIP images confirms the above findings CT Brain Perfusion Findings: CBF (<30%) Volume: 15mL Perfusion (Tmax>6.0s) volume: 75mL Mismatch Volume: 33mL Infarction Location:None IMPRESSION: No large vessel occlusion. Perfusion imaging identifies no evidence of core infarction or penumbra. Plaque at the ICA origins with less than 50% stenosis. High-grade stenosis of the non-dominant proximal right vertebral artery with reconstitution. Multifocal intracranial atherosclerosis involving anterior and posterior circulations. Partially imaged small bilateral pleural effusions. Initial results were called by telephone at the time of interpretation on 03/10/2020 at 1:59 pm to provider [facility] , who verbally acknowledged these results. Electronically Signed   By: Macy Mis M.D.   On: 03/10/2020 14:05   MR BRAIN WO CONTRAST  Result Date: 03/10/2020 CLINICAL DATA:  Encephalopathy EXAM: MRI HEAD WITHOUT CONTRAST TECHNIQUE: Multiplanar, multiecho pulse sequences of the brain and surrounding structures were obtained without intravenous contrast. COMPARISON:  None. FINDINGS: Attempted sequences are highly motion degraded. Diffusion sequence is nondiagnostic. IMPRESSION: Nondiagnostic study. Recommend repeating when possible with sedation. Electronically Signed   By: Macy Mis M.D.   On: 03/10/2020 15:18   CT CEREBRAL PERFUSION W CONTRAST  Result Date: 03/10/2020 CLINICAL DATA:  Left-sided weakness, code stroke follow-up EXAM: CT ANGIOGRAPHY HEAD AND NECK CT PERFUSION BRAIN TECHNIQUE: Multidetector CT imaging of the head and neck was performed using the standard protocol during bolus administration of intravenous contrast. Multiplanar CT image reconstructions and MIPs were obtained to evaluate the vascular anatomy. Carotid stenosis measurements (when applicable) are  obtained utilizing NASCET criteria, using the distal internal carotid diameter as the denominator. Multiphase CT imaging of the brain was performed following IV bolus contrast injection. Subsequent parametric perfusion maps were calculated using RAPID software. CONTRAST:  159mL OMNIPAQUE IOHEXOL 350 MG/ML SOLN COMPARISON:  MRA head 07/12/1920 FINDINGS: CTA NECK FINDINGS Aortic arch: Mild calcified plaque along the aortic arch and great vessel origins, which are patent. Right carotid system: Patent. There is primarily calcified plaque at the common carotid bifurcation and proximal internal carotid causing mild less than 50% stenosis. Left carotid system: Patent. There is calcified plaque at the ICA origin causing mild, less than 50% stenosis. Vertebral arteries: Patent. Left vertebral artery is dominant. There is calcified plaque at the origins without high-grade stenosis. Severe stenosis of the right V1 segment with reconstitution. Skeleton: Multilevel degenerative changes of the cervical spine. Other neck: No mass or adenopathy. Upper chest: Small bilateral pleural effusions. Review of the MIP images confirms the above findings  CTA HEAD FINDINGS Anterior circulation: Intracranial internal carotid arteries are patent with calcified plaque causing moderate stenosis. Middle and anterior cerebral arteries are patent. There is multifocal atherosclerosis. For example, short segment moderate to severe stenosis of the left A2 ACA, mild stenosis of the right M1 MCA, and multiple mild stenoses more distally. Posterior circulation: Intracranial vertebral arteries are patent. Right vertebral artery becomes small beyond PICA origin with superimposed plaque causing moderate to marked stenosis. Calcified plaque is present along the left vertebral artery causing mild stenosis. Diffuse irregularity and moderate stenosis of the basilar artery. Posterior cerebral arteries are patent with diffuse atherosclerotic irregularity and  superimposed stenoses ranging from mild to marked, left greater than right. Venous sinuses: As permitted by contrast timing, patent. Review of the MIP images confirms the above findings CT Brain Perfusion Findings: CBF (<30%) Volume: 65mL Perfusion (Tmax>6.0s) volume: 74mL Mismatch Volume: 51mL Infarction Location:None IMPRESSION: No large vessel occlusion. Perfusion imaging identifies no evidence of core infarction or penumbra. Plaque at the ICA origins with less than 50% stenosis. High-grade stenosis of the non-dominant proximal right vertebral artery with reconstitution. Multifocal intracranial atherosclerosis involving anterior and posterior circulations. Partially imaged small bilateral pleural effusions. Initial results were called by telephone at the time of interpretation on 03/10/2020 at 1:59 pm to provider [facility] , who verbally acknowledged these results. Electronically Signed   By: Macy Mis M.D.   On: 03/10/2020 14:05   DG Chest Port 1 View  Result Date: 03/10/2020 CLINICAL DATA:  Tachypnea EXAM: PORTABLE CHEST 1 VIEW COMPARISON:  December 28, 2019 FINDINGS: The right-sided PICC line has been removed. The heart size is enlarged. The pulmonary vasculature is dilated. Hazy bilateral airspace opacities are noted with prominent interstitial lung markings and Kerley B lines. There is consolidation versus atelectasis at the left lung base. Small bilateral pleural effusions are noted. Aortic calcifications are noted. IMPRESSION: Cardiomegaly with pulmonary vascular congestion and pulmonary edema. Small bilateral pleural effusions. Consolidation versus atelectasis at the left lung base. Dilated pulmonary arteries which can be seen in patients with elevated pulmonary artery pressures. Electronically Signed   By: Constance Holster M.D.   On: 03/10/2020 14:56   CT HEAD CODE STROKE WO CONTRAST  Result Date: 03/10/2020 CLINICAL DATA:  Code stroke. Acute onset of left upper and lower extremity weakness  and left facial droop as well as slurred speech beginning 1 hour ago. EXAM: CT HEAD WITHOUT CONTRAST TECHNIQUE: Contiguous axial images were obtained from the base of the skull through the vertex without intravenous contrast. COMPARISON:  CT head without contrast 12/26/2019 and MR head without contrast 12/31/2019. FINDINGS: Brain: Chronic encephalomalacia of the right frontal lobe white matter is stable. Diffuse white matter hypoattenuation is present bilaterally. Remote medial left occipital lobe infarct is present. Moderate generalized atrophy is noted. The ventricles are proportionate to the degree of atrophy. A remote posterior left cerebellar infarct is stable. Remote lacunar infarcts of the right cerebellum are stable. The brainstem is otherwise unremarkable. Vascular: Dense atherosclerotic calcifications are again noted within the cavernous internal carotid arteries bilaterally as well as the V3 segments of both vertebral arteries. No hyperdense vessel is present. Skull: Calvarium is intact. No focal lytic or blastic lesions are present. No significant extracranial soft tissue lesion is present. Sinuses/Orbits: The paranasal sinuses and mastoid air cells are clear. The globes and orbits are within normal limits. ASPECTS [facility] Stroke Program Early CT Score) - Ganglionic level infarction (caudate, lentiform nuclei, internal capsule, insula, M1-M3 cortex): 7/7 - Supraganglionic  infarction (M4-M6 cortex): 3/3 Total score (0-10 with 10 being normal): 10/10 IMPRESSION: 1. No acute intracranial abnormality or significant interval change. 2. Stable chronic encephalomalacia involving the right frontal white matter, the medial left occipital lobe, and posterior left cerebellum. 3. Moderate generalized atrophy and white matter disease likely reflects the sequela of chronic microvascular ischemia. 4. Stable remote lacunar infarcts of the right cerebellum and bilateral basal ganglia. 5. ASPECTS is 10/10 6. Extensive  atherosclerotic disease without a hyperdense vessel. These results were called by telephone at the time of interpretation on 03/10/2020 at 1:26 pm to provider [facility] , who verbally acknowledged these results. Electronically Signed   By: San Morelle M.D.   On: 03/10/2020 13:28    EKG: Independently reviewed.  Barely discernible P waves, slightly irregular rhythm.  QRS of 145, QTC prolonged at 1516.  No significant change from prior EKG.  Assessment/Plan Principal Problem:   Acute metabolic encephalopathy Active Problems:   Type 2 diabetes mellitus with hyperlipidemia (HCC)   Essential hypertension   CVA (cerebral vascular accident) ([place])   Acute on chronic combined systolic and diastolic CHF (congestive heart failure) (HCC)   Right bundle branch block   Atrial fibrillation with RVR (HCC)   Atrial fibrillation, chronic (HCC)   Left-sided weakness   Acute metabolic encephalopathy-with reported left-sided weakness, slurred speech, facial asymmetry.  Currently somnolent.  Head CT negative for acute abnormality.  Likely infectious etiology-UTI, UA suggestive with positive nitrites trace leukocytes many bacteria.  Also probable pneumonia also, chest x-ray showing consolidation versus atelectasis, pulmonary vascular congestion, pulmonary edema.  Hypothermic temperature 95.2, WBC 5.3.   -IV vancomycin and cefepime, meets HCAP criteria -BMP, CBC a.m. -Follow-up urine cultures -Obtain ABG -N.p.o. for now -Check procalcitonin  Left-sided weakness- with reported slurred speech, facial asymmetry.  Daughter reports left-sided weakness is new but reports admits to history of prior strokes.  MRI done in ED highly motion degraded, repeat is recommended with sedation.  History of atrial fibrillation, patient is already on Eliquis, ? compliance, but last dose was this morning. -CT perfusion study, CTA head and neck negative for large vessel occlusion, no infarct or penumbra, plaque ICA origin  less than 50% stenosis.  High-grade stenosis of the nondominant proximal right vertebral artery with reconstitution. -Will need repeat MRI with sedation -Telemetry neurology consulted-differentials- infectious, metabolic encephalopathy, seizures, recrudescence of old stroke symptoms -EEG -Holding Eliquis for now with altered mental status, n.p.o. status -Convert home Keppra to p.o., 500mg  q12h this was started empirically on prior hospitalization -Allow for permissive hypertension -Neurology consult -Echocardiogram -Hold home Lipitor  Atrial fibrillation-rate controlled and on anticoagulation. -Hold home carvedilol 12.5 mg every 12 hourly, -Hold home Eliquis while n.p.o.  Acute on chronic systolic and diastolic CHF- 2+ pitting extremity edema, chest x-ray showing pulmonary vascular congestion, pulmonary edema, small pleural effusions. Weight today 190 pounds, discharge weight 1/20 was 183 pounds.  Respiratory virus panel negative for influenza and COVID-19.  Home medication 60 mg Lasix daily.  Currently on 2 L O2 sats greater than 95%.  Last echo 2019 EF 30%, G2DD. -BNP -IV Lasix 40 every 12 hourly -Strict input output, daily weights  Prolonged QTC-516.  Potassium 3.5.  Normal magnesium normal 2.1.  Prior EKG showed prolonged QTC up to 515. - IV Kcl 10 meq x 2  CKD 3 b-creatinine 1.7, recent baseline 1.8-1.9. -IV Lasix 40 every 12 hourly  Hypertension-stable. -With n.p.o. status, and allow for permissive hypertension, hold home carvedilol, hydralazine, Imdur. -PRN IV labetalol  5 mg >  190.  Diabetes mellitus-random glucose 153.  A1c 6.6. - SSI- s -Hold home Actos, Januvia.  Chronic respiratory failure-on 2 L O2  History of gout- No acute flare appreciated -Hold allopurinol for now,  History of asthma-stable.  On 2 L home O2. -As needed albuterol   DVT prophylaxis: Heparin Code Status: Full code.  Patient and family have not had this good discussion patient.  Daughter  reports she would want her mother to be a full code. Family Communication: Talked to daughter [name] on the phone, patient has no designated healthcare power of attorney.  Patient's daughter Ascencion Dike as patient contact [phone] is assumed to be primary decision maker. Disposition Plan: > 2 days Consults called: None Admission status: Inpatient, stepdown I certify that at the point of admission it is my clinical judgment that the patient will require inpatient hospital care spanning beyond 2 midnights from the point of admission due to high intensity of service, high risk for further deterioration and high frequency of surveillance required. The following factors support the patient status of inpatient: Acute metabolic encephalopathy, currently somnolent, likely of infectious etiology possible also stroke requiring inpatient hospitalization.   Bethena Roys MD [facility]  03/10/2020, 9:38 PM

## 2020-03-10 NOTE — ED Notes (Signed)
Family at bedside. 

## 2020-03-10 NOTE — ED Notes (Signed)
Son in room to speak with stroke nurse

## 2020-03-10 NOTE — Consult Note (Signed)
[facility] TeleNeurology Consult Services   Date of Service:   03/10/2020 13:34:07  Impression:     .  I63.9 - Cerebrovascular accident (CVA), unspecified mechanism ([place])  Comments/Sign-Out: right gaze preference but able to cross midline, LUE weakness, B/L LE weakness but LLE weaker than RLE with altered mental status. Will need to rule out L hemispheric stroke. However, given the B/L symptoms and altered mental status, seizure and encephalopathy is also on the differential. Given CT head findings, Recrudescence of old stroke symptoms in the setting of a metabolic/infectious cause is also possible.  Metrics: Last Known Well: 03/10/2020 09:00:00 [facility] Notification Time: 03/10/2020 13:34:07 Arrival Time: 03/10/2020 12:59:00 Stamp Time: 03/10/2020 13:34:07 Time First Login Attempt: 03/10/2020 13:40:26 Symptoms: left sided weakness NIHSS Start Assessment Time: 03/10/2020 13:46:23 Patient is not a candidate for Alteplase/Activase. Alteplase Medical Decision: 03/10/2020 13:43:00 Patient was not deemed candidate for Alteplase/Activase thrombolytics because of Last Well Known Above 4.5 Hours.  CT head showed no acute hemorrhage or acute core infarct.  Clinical Presentation is Suggestive of Large Vessel Occlusive Disease, Recommendations are as Follows  CTA Head and Neck. CT Perfusion. Reviewed, No Indication of Large Vessel Occlusive Thrombus, Patient is not an NIR Candidate.   ED Physician notified of diagnostic impression and management plan on 03/10/2020 14:06:00  Our recommendations are outlined below.  Recommendations:     .  Activate Stroke Protocol Admission/Order Set     .  Stroke/Telemetry Floor     .  Neuro Checks     .  Bedside Swallow Eval     .  DVT Prophylaxis     .  IV Fluids, Normal Saline     .  Head of Bed 30 Degrees     .  Euglycemia and Avoid Hyperthermia (PRN Acetaminophen)     .  Antiplatelet Therapy Recommended  Routine  Consultation with [facility] for Follow up Care  Sign Out:     .  Discussed with Emergency Department Provider    ------------------------------------------------------------------------------  History of Present Illness: Patient is a 84 year old Female.  Patient was brought by EMS for symptoms of left sided weakness  84 year old woman with with history of afib (on Eliquis, last took it this morning), DM, HTN, stroke, and CKD stage 3 presents with slurred speech and left facial droop. SHe woke up normal at 830. She was last seen normal by family at 66 when she went to the couch and sat down. They found her at 1000 with slurred speech, left facial droop, and drooling. At 1145, she could not get up. Per nursing, both hands were weak with gripping but the left was worse. This has never happened to her before. Her family states she has never had stroke like symptoms even though CT head shows multifocal stroke.    Past Medical History:     . Hypertension     . Diabetes Mellitus     . Atrial Fibrillation         Examination: BP(148/70), Pulse(71), Blood Glucose(200) 1A: Level of Consciousness - Requires repeated stimulation to arouse + 2 1B: Ask Month and Age - Both Questions Right + 0 1C: Blink Eyes & Squeeze Hands - Performs Both Tasks + 0 2: Test Horizontal Extraocular Movements - Partial Gaze Palsy: Can Be Overcome + 1 3: Test Visual Fields - No Visual Loss + 0 4: Test Facial Palsy (Use Grimace if Obtunded) - Minor paralysis (flat nasolabial fold, smile asymmetry) + 1 5A: Test  Left Arm Motor Drift - Drift, but doesn't hit bed + 1 5B: Test Right Arm Motor Drift - No Drift for 10 Seconds + 0 6A: Test Left Leg Motor Drift - No Effort Against Gravity + 3 6B: Test Right Leg Motor Drift - Drift, hits bed + 2 7: Test Limb Ataxia (FNF/Heel-Shin) - No Ataxia + 0 8: Test Sensation - Normal; No sensory loss + 0 9: Test Language/Aphasia - Normal; No aphasia + 0 10: Test Dysarthria  - Normal + 0 11: Test Extinction/Inattention - No abnormality + 0  NIHSS Score: 10  Pre-Morbid Modified Ranking Scale: 1 Points = No significant disability despite symptoms; able to carry out all usual duties and activities   Patient/Family was informed the Neurology Consult would occur via TeleHealth consult by way of interactive audio and video telecommunications and consented to receiving care in this manner.   Due to the immediate potential for life-threatening deterioration due to underlying acute neurologic illness, I spent 30 minutes providing critical care. This time includes time for face to face visit via telemedicine, review of medical records, imaging studies and discussion of findings with providers, the patient and/or family.   Dr Uvaldo Bristle   [facility] [phone]  Case [id number]

## 2020-03-11 ENCOUNTER — Other Ambulatory Visit: Payer: Self-pay

## 2020-03-11 ENCOUNTER — Inpatient Hospital Stay (HOSPITAL_COMMUNITY): Payer: Medicare Other

## 2020-03-11 ENCOUNTER — Other Ambulatory Visit (HOSPITAL_COMMUNITY): Payer: Medicare Other

## 2020-03-11 DIAGNOSIS — I361 Nonrheumatic tricuspid (valve) insufficiency: Secondary | ICD-10-CM

## 2020-03-11 DIAGNOSIS — E785 Hyperlipidemia, unspecified: Secondary | ICD-10-CM

## 2020-03-11 DIAGNOSIS — I639 Cerebral infarction, unspecified: Secondary | ICD-10-CM

## 2020-03-11 DIAGNOSIS — Z515 Encounter for palliative care: Secondary | ICD-10-CM

## 2020-03-11 DIAGNOSIS — I340 Nonrheumatic mitral (valve) insufficiency: Secondary | ICD-10-CM

## 2020-03-11 DIAGNOSIS — E1169 Type 2 diabetes mellitus with other specified complication: Secondary | ICD-10-CM

## 2020-03-11 DIAGNOSIS — I4891 Unspecified atrial fibrillation: Secondary | ICD-10-CM

## 2020-03-11 DIAGNOSIS — I5043 Acute on chronic combined systolic (congestive) and diastolic (congestive) heart failure: Secondary | ICD-10-CM

## 2020-03-11 DIAGNOSIS — Z7189 Other specified counseling: Secondary | ICD-10-CM

## 2020-03-11 LAB — CBC
HCT: 32.5 % — ABNORMAL LOW (ref 36.0–46.0)
Hemoglobin: 9.4 g/dL — ABNORMAL LOW (ref 12.0–15.0)
MCH: 26.3 pg (ref 26.0–34.0)
MCHC: 28.9 g/dL — ABNORMAL LOW (ref 30.0–36.0)
MCV: 90.8 fL (ref 80.0–100.0)
Platelets: 247 10*3/uL (ref 150–400)
RBC: 3.58 MIL/uL — ABNORMAL LOW (ref 3.87–5.11)
RDW: 15 % (ref 11.5–15.5)
WBC: 5.8 10*3/uL (ref 4.0–10.5)
nRBC: 0 % (ref 0.0–0.2)

## 2020-03-11 LAB — COMPREHENSIVE METABOLIC PANEL
ALT: 11 U/L (ref 0–44)
AST: 11 U/L — ABNORMAL LOW (ref 15–41)
Albumin: 3 g/dL — ABNORMAL LOW (ref 3.5–5.0)
Alkaline Phosphatase: 126 U/L (ref 38–126)
Anion gap: 11 (ref 5–15)
BUN: 19 mg/dL (ref 8–23)
CO2: 26 mmol/L (ref 22–32)
Calcium: 8.9 mg/dL (ref 8.9–10.3)
Chloride: 109 mmol/L (ref 98–111)
Creatinine, Ser: 1.67 mg/dL — ABNORMAL HIGH (ref 0.44–1.00)
GFR calc Af Amer: 33 mL/min — ABNORMAL LOW (ref >60)
GFR calc non Af Amer: 28 mL/min — ABNORMAL LOW (ref >60)
Glucose, Bld: 104 mg/dL — ABNORMAL HIGH (ref 70–99)
Potassium: 3.7 mmol/L (ref 3.5–5.1)
Sodium: 146 mmol/L — ABNORMAL HIGH (ref 135–145)
Total Bilirubin: 0.7 mg/dL (ref 0.3–1.2)
Total Protein: 6.4 g/dL — ABNORMAL LOW (ref 6.5–8.1)

## 2020-03-11 LAB — LIPID PANEL
Cholesterol: 134 mg/dL (ref 0–200)
HDL: 33 mg/dL — ABNORMAL LOW (ref >40)
LDL Cholesterol: 77 mg/dL (ref 0–99)
Total CHOL/HDL Ratio: 4.1 RATIO
Triglycerides: 118 mg/dL (ref <150)
VLDL: 24 mg/dL (ref 0–40)

## 2020-03-11 LAB — GLUCOSE, CAPILLARY
Glucose-Capillary: 100 mg/dL — ABNORMAL HIGH (ref 70–99)
Glucose-Capillary: 85 mg/dL (ref 70–99)
Glucose-Capillary: 97 mg/dL (ref 70–99)
Glucose-Capillary: 113 mg/dL — ABNORMAL HIGH (ref 70–99)
Glucose-Capillary: 104 mg/dL — ABNORMAL HIGH (ref 70–99)
Glucose-Capillary: 89 mg/dL (ref 70–99)

## 2020-03-11 LAB — SARS CORONAVIRUS 2 (TAT 6-24 HRS): SARS Coronavirus 2: NEGATIVE

## 2020-03-11 LAB — MRSA PCR SCREENING: MRSA by PCR: POSITIVE — AB

## 2020-03-11 LAB — ECHOCARDIOGRAM COMPLETE
Height: 65 in
Weight: 3044.11 oz

## 2020-03-11 MED ORDER — MUPIROCIN 2 % EX OINT
1.0000 "application " | TOPICAL_OINTMENT | Freq: Two times a day (BID) | CUTANEOUS | Status: AC
  Administered 2020-03-11 – 2020-03-15 (×10): 1 via NASAL
  Filled 2020-03-11 (×4): qty 22

## 2020-03-11 MED ORDER — ASPIRIN 300 MG RE SUPP
300.0000 mg | Freq: Every day | RECTAL | Status: DC
  Administered 2020-03-11 – 2020-03-13 (×3): 300 mg via RECTAL
  Filled 2020-03-11 (×3): qty 1

## 2020-03-11 MED ORDER — ASPIRIN 325 MG PO TABS
325.0000 mg | ORAL_TABLET | Freq: Every day | ORAL | Status: DC
  Filled 2020-03-11: qty 1

## 2020-03-11 MED ORDER — LORAZEPAM 2 MG/ML IJ SOLN
0.5000 mg | INTRAMUSCULAR | Status: DC | PRN
  Administered 2020-03-12 – 2020-03-13 (×2): 0.5 mg via INTRAVENOUS
  Filled 2020-03-11 (×2): qty 1

## 2020-03-11 MED ORDER — CHLORHEXIDINE GLUCONATE CLOTH 2 % EX PADS
6.0000 | MEDICATED_PAD | Freq: Every day | CUTANEOUS | Status: AC
  Administered 2020-03-11 – 2020-03-15 (×4): 6 via TOPICAL

## 2020-03-11 MED ORDER — LORAZEPAM 2 MG/ML IJ SOLN
0.5000 mg | Freq: Once | INTRAMUSCULAR | Status: AC | PRN
  Administered 2020-03-11: 0.5 mg via INTRAVENOUS
  Filled 2020-03-11: qty 1

## 2020-03-11 MED ORDER — STROKE: EARLY STAGES OF RECOVERY BOOK: Freq: Once | Status: AC

## 2020-03-11 MED ORDER — LABETALOL HCL 5 MG/ML IV SOLN: 5.0000 mg | INTRAVENOUS | Status: DC | PRN

## 2020-03-11 MED ORDER — HALOPERIDOL LACTATE 5 MG/ML IJ SOLN
1.0000 mg | Freq: Four times a day (QID) | INTRAMUSCULAR | Status: DC | PRN
  Administered 2020-03-11 – 2020-03-13 (×2): 1 mg via INTRAVENOUS
  Filled 2020-03-11 (×2): qty 1

## 2020-03-11 NOTE — Progress Notes (Signed)
Patient returned to the unit from MRI. MRI completed after Ativan given per order. Vital signs stable. Awaiting imaging results at this time.  Celestia Khat, RN

## 2020-03-11 NOTE — Evaluation (Signed)
Clinical/Bedside Swallow Evaluation Patient Details  Name: [name] MRN: [id number] Date of Birth: 1936/08/29  Today's Date: 03/11/2020 Time: SLP Start Time (ACUTE ONLY): 1232 SLP Stop Time (ACUTE ONLY): 1252 SLP Time Calculation (min) (ACUTE ONLY): 20 min  Past Medical History:  Past Medical History:  Diagnosis Date  . Asthma   . Bronchitis   . Carotid artery disease ([place])   . Coronary atherosclerosis of native coronary artery    Nonobstructive at cath 2005  . DM2 (diabetes mellitus, type 2) ([place])   . Essential hypertension   . Gout   . History of stroke   . Hypothyroidism   . Influenza 2013  . Nonischemic cardiomyopathy ([facility])    LVEF 20-25%  . PAF (paroxysmal atrial fibrillation) ([place])    a. diagnosed in 04/2018. Started on Eliquis for anticoagulation.   . Renal artery stenosis ([facility])    Bilateral   Past Surgical History:  Past Surgical History:  Procedure Laterality Date  . ABDOMINAL HYSTERECTOMY    . CATARACT EXTRACTION W/PHACO Left 01/18/2019   Procedure: CATARACT EXTRACTION PHACO AND INTRAOCULAR LENS PLACEMENT ([place]);  Surgeon: Baruch Goldmann, MD;  Location: [facility];  Service: Ophthalmology;  Laterality: Left;  CDE: 11.99  . CATARACT EXTRACTION W/PHACO Right 02/26/2019   Procedure: CATARACT EXTRACTION PHACO AND INTRAOCULAR LENS PLACEMENT RIGHT EYE (CDE: 13.46);  Surgeon: Baruch Goldmann, MD;  Location: [facility];  Service: Ophthalmology;  Laterality: Right;   HPI:  84 y.o. female  with past medical history of stroke, dementia, CAD, combined systolic/diastolic CHF (EF 99991111), 2L oxygen at home, atrial fibrillation on Eliquis, diabetes, HTN, CKD stage 3b. Hospitalization 12/26/19-01/01/20 with UTI and aspiration pneumonia and concern for possible seizure. Admitted on 03/10/2020 with slurred speech and left facial droop with worsening weakness left side worse than right. CT head with no evidence of stroke. Treating for UTI and probable pneumonia as well as pulmonary edema. Head  CT negative for acute abnormality, shows stable chronic encephalomalacia involving the right frontal white matter, medial left occipital lobe and posterior left cerebellum, moderate generalized atrophy, stable remote lacunar basal ganglia infarcts. MRI motion degraded but shows: Restricted diffusion in the deep white matter on the right compatible with acute infarction. Portable chest x-ray showed cardiomegaly with pulmonary vascular congestion and pulmonary edema, small bilateral pleural effusions, consolidation versus atelectasis at the left lung base. Pt was seen by SLP during January admission for BSE (advanced to D3/thin by discharge). BSE requested.    Assessment / Plan / Recommendation Clinical Impression  Clinical swallowing evaluation completed -- Pt is at [place] for aspiration. Pt was repositioned to be seated upright in bed; Pt immediately coughed when positioned upright and note L facial droop and Pt's inability to follow commands to complete oral mech exam. Pt is not managing her secretions as evidenced by wet congested sounded breathing/coughing and L anterior spillage of secretions. SLP provided oral suction via yankauer and Pt immediately began attempting to hit and "swat" demonstrated significant agitation. SLP provided verbal calming support and despite maximal cues and despite pt moving around and her level of agitation, she never opened her eyes. SLP placed a single ice chip at pt's labial surface which only facilitated agitation and Pt jerked her head away immediately. Recommend Pt be NPO at this time and medications to be administered via alternative means. SLP will provide ongoing diagnostic dysphagia treatment to determine readiness for objective swallowing evaluation or subjective appropriateness to initiate diet. Note Palliative involvement, SLP will follow for goals  of care. SLP Visit Diagnosis: Dysphagia, unspecified (R13.10)    Aspiration Risk  Severe aspiration risk;Risk for  inadequate nutrition/hydration    Diet Recommendation NPO   Medication Administration: Via alternative means    Other  Recommendations Oral Care Recommendations: Oral care QID   Follow up Recommendations 24 hour supervision/assistance;Skilled Nursing facility      Frequency and Duration min 1 x/week  1 week       Prognosis Prognosis for Safe Diet Advancement: Guarded Barriers to Reach Goals: Severity of deficits      Swallow Study   General Date of Onset: 03/10/20 HPI: 84 y.o. female  with past medical history of stroke, dementia, CAD, combined systolic/diastolic CHF (EF 99991111), 2L oxygen at home, atrial fibrillation on Eliquis, diabetes, HTN, CKD stage 3b. Hospitalization 12/26/19-01/01/20 with UTI and aspiration pneumonia and concern for possible seizure. Admitted on 03/10/2020 with slurred speech and left facial droop with worsening weakness left side worse than right. CT head with no evidence of stroke. Treating for UTI and probable pneumonia as well as pulmonary edema. Head CT negative for acute abnormality, shows stable chronic encephalomalacia involving the right frontal white matter, medial left occipital lobe and posterior left cerebellum, moderate generalized atrophy, stable remote lacunar basal ganglia infarcts. MRI motion degraded but shows: Restricted diffusion in the deep white matter on the right compatible with acute infarction. Portable chest x-ray showed cardiomegaly with pulmonary vascular congestion and pulmonary edema, small bilateral pleural effusions, consolidation versus atelectasis at the left lung base. Pt was seen by SLP during January admission for BSE (advanced to D3/thin by discharge). BSE requested.  Type of Study: Bedside Swallow Evaluation Previous Swallow Assessment: 12/2019 Diet Prior to this Study: Dysphagia 1 (puree);Thin liquids Temperature Spikes Noted: No Respiratory Status: Nasal cannula History of Recent Intubation: No Behavior/Cognition:  Agitated;Uncooperative;Lethargic/Drowsy;Doesn't follow directions Oral Cavity Assessment: Excessive secretions Oral Care Completed by SLP: Recent completion by staff Oral Cavity - Dentition: Edentulous Volitional Cough: Cognitively unable to elicit    Oral/Motor/Sensory Function Overall Oral Motor/Sensory Function: Severe impairment Facial ROM: Reduced left Facial Symmetry: Abnormal symmetry left Facial Strength: Reduced left   Ice Chips     Thin Liquid      Nectar Thick     Honey Thick     Puree     Solid           Konnor Jorden H. Roddie Mc, CCC-SLP Speech Language Pathologist  Wende Bushy 03/11/2020,1:13 PM

## 2020-03-11 NOTE — Progress Notes (Signed)
Attempted EEG. Pt started to swing and yell at tech. Then started rubbing off the leads with her mit. Informed nursing staff.  Pt not cooperative and EEG cannot be completed at this time. This tech also attempted to clean paste out of hair. Unable to get all paste out due to Pt continuing to swing and cover her head with her mitt.

## 2020-03-11 NOTE — Consult Note (Signed)
[place] A. Merlene Laughter, MD     [email or web address]          Brook TERRYE UHL is an 84 y.o. female.   ASSESSMENT/PLAN: 1. ACUTE ENCEPHALOPATHY/ ALTERED MENTATION DUE TO ACUTE ISCHEMIC STROKE AND A ACUTE CYSTITIS. 2. ACUTE RIGHT LENTIFORM NUCLEUS INFARCT: THIS IS MOST LIKELY A MODERATE SIZE  CARDIO EMBOLIC ISCHEMIC EVENT IN THE SETTING OF PAROXYSMAL ATRIAL FIBRILLATION.  Aspirin 325 is recommended to the current anticoagulation for 1 month. However, consider switching Eliquis to Xarelto.  Gradual blood pressure lowering is recommended 48 hours after the onset of the event. 3. VASCULAR DEMENTIA:  Continue with the Aricept. Namenda will be added.    This is a 84 year old black female who presents with the acute onset of altered mentation. She is noted to have left-sided weakness.   The patient has been quite confused and disruptive during the hospitalization. The patient has been combative during the evaluation in the hospital. She was given Ativan because of the combativeness.  Review of system is not obtainable given the encephalopathy.   GENERAL:  She lays in bed with eyes closed. She resist the evaluation.  HEENT:  Neck is supple no trauma appreciated.  ABDOMEN: soft  EXTREMITIES: No edema   BACK:  Normal  SKIN: Normal by inspection.    MENTAL STATUS:  There is no eye opening even to deep painful stimuli. She moans and groans and tries to push back with her hands but does not follow commands.  CRANIAL NERVES:  Eyes are deviated towards the left but they are easily overcome with oculocephalic reflexes. Pupils are equal, round and reactive to light; extra ocular movements are full, there is no significant nystagmus; visual fields are full; upper and lower facial muscles are normal in strength and symmetric, there is no flattening of the nasolabial folds.  MOTOR:  Left upper extremity is graded as 2/5. The other extremities are graded about 3-4 /5. Bulk and tone  seemed to be unremarkable throughout.  COORDINATION:  No dysmetria noted; No myoclonus; No tremors; no bradykinesia.  REFLEXES: Deep tendon reflexes are symmetrical and normal. Plantar reflexes are flexor bilaterally.   SENSATION: Normal pain.     NIH stroke scale 3, 2, 2, 1, 0, 3, 2, 2, 2, 0, 0, 3, 0, 0 total 20.   Blood pressure (!) 205/102, pulse 83, temperature 97.7 F (36.5 C), temperature source Axillary, resp. rate 20, height 5\' 5"  (1.651 m), weight 86.3 kg, SpO2 98 %.  Past Medical History:  Diagnosis Date  . Asthma   . Bronchitis   . Carotid artery disease ([place])   . Coronary atherosclerosis of native coronary artery    Nonobstructive at cath 2005  . DM2 (diabetes mellitus, type 2) ([place])   . Essential hypertension   . Gout   . History of stroke   . Hypothyroidism   . Influenza 2013  . Nonischemic cardiomyopathy (HCC)    LVEF 20-25%  . PAF (paroxysmal atrial fibrillation) ([place])    a. diagnosed in 04/2018. Started on Eliquis for anticoagulation.   . Renal artery stenosis (HCC)    Bilateral    Past Surgical History:  Procedure Laterality Date  . ABDOMINAL HYSTERECTOMY    . CATARACT EXTRACTION W/PHACO Left 01/18/2019   Procedure: CATARACT EXTRACTION PHACO AND INTRAOCULAR LENS PLACEMENT ([place]);  Surgeon: Baruch Goldmann, MD;  Location: [facility];  Service: Ophthalmology;  Laterality: Left;  CDE: 11.99  . CATARACT EXTRACTION W/PHACO Right 02/26/2019   Procedure: CATARACT EXTRACTION PHACO  AND INTRAOCULAR LENS PLACEMENT RIGHT EYE (CDE: 13.46);  Surgeon: Baruch Goldmann, MD;  Location: [facility];  Service: Ophthalmology;  Laterality: Right;    Family History  Problem Relation Age of Onset  . Asthma Son     Social History:  reports that she has never smoked. She has never used smokeless tobacco. She reports that she does not drink alcohol or use drugs.  Allergies:  Allergies  Allergen Reactions  . [place] Venom [Bee Venom] Shortness Of Breath and Itching     Medications: Prior to Admission medications   Medication Sig Start Date End Date Taking? Authorizing Provider  carvedilol (COREG) 12.5 MG tablet TAKE 1 TABLET(12.5 MG) BY MOUTH TWICE DAILY WITH A MEAL Patient taking differently: Take 12.5 mg by mouth 2 (two) times daily with a meal.  05/15/19  Yes Satira Sark, MD  cholecalciferol (VITAMIN D) 25 MCG (1000 UT) tablet Take 1,000 Units by mouth 2 (two) times daily.   Yes [provider]  donepezil (ARICEPT) 10 MG tablet Take 10 mg by mouth daily.   Yes [provider]  ELIQUIS 2.5 MG TABS tablet TAKE ONE TABLET BY MOUTH TWICE A DAY Patient taking differently: Take 2.5 mg by mouth 2 (two) times daily.  03/10/20  Yes Satira Sark, MD  gabapentin (NEURONTIN) 100 MG capsule Take 100 mg by mouth 3 (three) times daily.    Yes [provider]  hydrALAZINE (APRESOLINE) 50 MG tablet Take 1.5 tablets (75 mg total) by mouth every 8 (eight) hours. 04/27/18  Yes Barton Dubois, MD  isosorbide mononitrate (IMDUR) 30 MG 24 hr tablet Take 1 tablet (30 mg total) by mouth daily. 09/16/19  Yes Satira Sark, MD  levETIRAcetam (KEPPRA) 500 MG tablet Take 1 tablet (500 mg total) by mouth 2 (two) times daily. 01/01/20  Yes Barton Dubois, MD  pioglitazone (ACTOS) 15 MG tablet Take 15 mg by mouth daily with breakfast.  08/12/17  Yes [provider]  sitaGLIPtin (JANUVIA) 50 MG tablet Take 50 mg by mouth daily.   Yes [provider]  fluticasone (FLONASE) 50 MCG/ACT nasal spray Place 1 spray into both nostrils daily as needed for allergies or rhinitis.  01/03/19   [provider]  OXYGEN Inhale 2 L into the lungs daily as needed (for shortness of breath).    [provider]    Scheduled Meds: . aspirin  300 mg Rectal Daily   Or  . aspirin  325 mg Oral Daily  . Chlorhexidine Gluconate Cloth  6 each Topical Q0600  . furosemide  40 mg Intravenous Q12H  . heparin  5,000 Units Subcutaneous Q8H  .  insulin aspart  0-9 Units Subcutaneous Q4H  . mupirocin ointment  1 application Nasal BID   Continuous Infusions: . levETIRAcetam 500 mg (03/11/20 1625)   PRN Meds:.acetaminophen **OR** acetaminophen, haloperidol lactate, iohexol, labetalol, LORazepam, ondansetron **OR** ondansetron (ZOFRAN) IV     Results for orders placed or performed during the hospital encounter of 03/10/20 (from the past 48 hour(s))  CBG monitoring, ED     Status: Abnormal   Collection Time: 03/10/20  1:09 PM  Result Value Ref Range   Glucose-Capillary 142 (H) 70 - 99 mg/dL    Comment: Glucose reference range applies only to samples taken after fasting for at least 8 hours.  I-stat chem 8, ED     Status: Abnormal   Collection Time: 03/10/20  1:12 PM  Result Value Ref Range   Sodium 144 135 -  145 mmol/L   Potassium 3.5 3.5 - 5.1 mmol/L   Chloride 108 98 - 111 mmol/L   BUN 19 8 - 23 mg/dL   Creatinine, Ser 1.80 (H) 0.44 - 1.00 mg/dL   Glucose, Bld 151 (H) 70 - 99 mg/dL    Comment: Glucose reference range applies only to samples taken after fasting for at least 8 hours.   Calcium, Ion 1.23 1.15 - 1.40 mmol/L   TCO2 28 22 - 32 mmol/L   Hemoglobin 10.9 (L) 12.0 - 15.0 g/dL   HCT 32.0 (L) 36.0 - 46.0 %  Ethanol     Status: None   Collection Time: 03/10/20  1:12 PM  Result Value Ref Range   Alcohol, Ethyl (B) <10 <10 mg/dL    Comment: (NOTE) Lowest detectable limit for serum alcohol is 10 mg/dL. For medical purposes only. Performed at [facility], [address], [place], [place] [address]   Protime-INR     Status: Abnormal   Collection Time: 03/10/20  1:12 PM  Result Value Ref Range   Prothrombin Time 17.9 (H) 11.4 - 15.2 seconds   INR 1.5 (H) 0.8 - 1.2    Comment: (NOTE) INR goal varies based on device and disease states. Performed at [facility], [address], [place], [place] [address]   APTT     Status: Abnormal   Collection Time: 03/10/20  1:12 PM  Result Value Ref Range   aPTT 39 (H) 24 - 36  seconds    Comment:        IF BASELINE aPTT IS ELEVATED, SUGGEST PATIENT RISK ASSESSMENT BE USED TO DETERMINE APPROPRIATE ANTICOAGULANT THERAPY. Performed at [facility], [address], [place], [place] [address]   CBC     Status: Abnormal   Collection Time: 03/10/20  1:12 PM  Result Value Ref Range   WBC 5.3 4.0 - 10.5 K/uL   RBC 3.81 (L) 3.87 - 5.11 MIL/uL   Hemoglobin 10.1 (L) 12.0 - 15.0 g/dL   HCT 33.9 (L) 36.0 - 46.0 %   MCV 89.0 80.0 - 100.0 fL   MCH 26.5 26.0 - 34.0 pg   MCHC 29.8 (L) 30.0 - 36.0 g/dL   RDW 14.9 11.5 - 15.5 %   Platelets 242 150 - 400 K/uL   nRBC 0.0 0.0 - 0.2 %    Comment: Performed at [facility], [address], [place], [place] [address]  Differential     Status: None   Collection Time: 03/10/20  1:12 PM  Result Value Ref Range   Neutrophils Relative % 64 %   Neutro Abs 3.4 1.7 - 7.7 K/uL   Lymphocytes Relative 25 %   Lymphs Abs 1.3 0.7 - 4.0 K/uL   Monocytes Relative 9 %   Monocytes Absolute 0.5 0.1 - 1.0 K/uL   Eosinophils Relative 1 %   Eosinophils Absolute 0.1 0.0 - 0.5 K/uL   Basophils Relative 1 %   Basophils Absolute 0.0 0.0 - 0.1 K/uL   Immature Granulocytes 0 %   Abs Immature Granulocytes 0.01 0.00 - 0.07 K/uL    Comment: Performed at [facility], [address], [place], [place] [address]  Comprehensive metabolic panel     Status: Abnormal   Collection Time: 03/10/20  1:12 PM  Result Value Ref Range   Sodium 144 135 - 145 mmol/L   Potassium 3.5 3.5 - 5.1 mmol/L   Chloride 109 98 - 111 mmol/L   CO2 26 22 - 32 mmol/L   Glucose,  Bld 153 (H) 70 - 99 mg/dL    Comment: Glucose reference range applies only to samples taken after fasting for at least 8 hours.   BUN 20 8 - 23 mg/dL   Creatinine, Ser 1.71 (H) 0.44 - 1.00 mg/dL   Calcium 8.9 8.9 - 10.3 mg/dL   Total Protein 6.8 6.5 - 8.1 g/dL   Albumin 3.1 (L) 3.5 - 5.0 g/dL   AST 13 (L) 15 - 41 U/L   ALT 12 0 - 44 U/L   Alkaline Phosphatase 149 (H) 38 - 126 U/L   Total Bilirubin 0.3  0.3 - 1.2 mg/dL   GFR calc non Af Amer 28 (L) >60 mL/min   GFR calc Af Amer 32 (L) >60 mL/min   Anion gap 9 5 - 15    Comment: Performed at [facility], [address] [place], [place] [address]  Magnesium     Status: None   Collection Time: 03/10/20  1:12 PM  Result Value Ref Range   Magnesium 2.1 1.7 - 2.4 mg/dL    Comment: Performed at [facility], [address] [place], [place] [address]  Procalcitonin - Baseline     Status: None   Collection Time: 03/10/20  1:12 PM  Result Value Ref Range   Procalcitonin <0.10 ng/mL    Comment:        Interpretation: PCT (Procalcitonin) <= 0.5 ng/mL: Systemic infection (sepsis) is not likely. Local bacterial infection is possible. (NOTE)       Sepsis PCT Algorithm           Lower Respiratory Tract                                      Infection PCT Algorithm    ----------------------------     ----------------------------         PCT < 0.25 ng/mL                PCT < 0.10 ng/mL         Strongly encourage             Strongly discourage   discontinuation of antibiotics    initiation of antibiotics    ----------------------------     -----------------------------       PCT 0.25 - 0.50 ng/mL            PCT 0.10 - 0.25 ng/mL               OR       >80% decrease in PCT            Discourage initiation of                                            antibiotics      Encourage discontinuation           of antibiotics    ----------------------------     -----------------------------         PCT >= 0.50 ng/mL              PCT 0.26 - 0.50 ng/mL               AND        <80% decrease in PCT  Encourage initiation of                                             antibiotics       Encourage continuation           of antibiotics    ----------------------------     -----------------------------        PCT >= 0.50 ng/mL                  PCT > 0.50 ng/mL               AND         increase in PCT                  Strongly encourage                                       initiation of antibiotics    Strongly encourage escalation           of antibiotics                                     -----------------------------                                           PCT <= 0.25 ng/mL                                                 OR                                        > 80% decrease in PCT                                     Discontinue / Do not initiate                                             antibiotics Performed at [facility], [address] [place], [place] [address]   Brain natriuretic peptide     Status: Abnormal   Collection Time: 03/10/20  1:12 PM  Result Value Ref Range   B Natriuretic Peptide 455.0 (H) 0.0 - 100.0 pg/mL    Comment: Performed at [facility], [address] [place], [place] [address]  Urine rapid drug screen (hosp performed)     Status: None   Collection Time: 03/10/20  3:38 PM  Result Value Ref Range   Opiates NONE DETECTED NONE DETECTED   Cocaine NONE DETECTED NONE DETECTED   Benzodiazepines NONE DETECTED NONE DETECTED   Amphetamines NONE DETECTED NONE DETECTED   Tetrahydrocannabinol NONE DETECTED NONE DETECTED   Barbiturates  NONE DETECTED NONE DETECTED    Comment: (NOTE) DRUG SCREEN FOR MEDICAL PURPOSES ONLY.  IF CONFIRMATION IS NEEDED FOR ANY PURPOSE, NOTIFY LAB WITHIN 5 DAYS. LOWEST DETECTABLE LIMITS FOR URINE DRUG SCREEN Drug Class                     Cutoff (ng/mL) Amphetamine and metabolites    1000 Barbiturate and metabolites    200 Benzodiazepine                 [id number] Tricyclics and metabolites     300 Opiates and metabolites        300 Cocaine and metabolites        300 THC                            50 Performed at [facility], [address], [place], [place] [address]   Urinalysis, Routine w reflex microscopic     Status: Abnormal   Collection Time: 03/10/20  3:38 PM  Result Value Ref Range   Color, Urine YELLOW YELLOW   APPearance CLOUDY (A) CLEAR   Specific Gravity,  Urine 1.024 1.005 - 1.030   pH 6.0 5.0 - 8.0   Glucose, UA NEGATIVE NEGATIVE mg/dL   Hgb urine dipstick LARGE (A) NEGATIVE   Bilirubin Urine NEGATIVE NEGATIVE   Ketones, ur NEGATIVE NEGATIVE mg/dL   Protein, ur 30 (A) NEGATIVE mg/dL   Nitrite POSITIVE (A) NEGATIVE   Leukocytes,Ua TRACE (A) NEGATIVE   RBC / HPF >50 (H) 0 - 5 RBC/hpf   WBC, UA 6-10 0 - 5 WBC/hpf   Bacteria, UA MANY (A) NONE SEEN   Squamous Epithelial / LPF 0-5 0 - 5   Mucus PRESENT     Comment: Performed at [facility], [address], [place], [place] [address]  Urine culture     Status: Abnormal (Preliminary result)   Collection Time: 03/10/20  3:38 PM   Specimen: Urine, Random  Result Value Ref Range   Specimen Description      URINE, RANDOM Performed at [facility], [address] [place], [place] [address]    Special Requests      NONE Performed at [facility], [address] [place], [place] [address]    Culture (A)     >=100,000 COLONIES/mL GRAM NEGATIVE RODS SUSCEPTIBILITIES TO FOLLOW Performed at [facility], [place] [address], [place], [place] [address]    Report Status PENDING   Respiratory Panel by RT PCR (Flu A&B, Covid) - Nasopharyngeal Swab     Status: None   Collection Time: 03/10/20  3:39 PM   Specimen: Nasopharyngeal Swab  Result Value Ref Range   SARS Coronavirus 2 by RT PCR NEGATIVE NEGATIVE    Comment: (NOTE) SARS-CoV-2 target nucleic acids are NOT DETECTED. The SARS-CoV-2 RNA is generally detectable in upper respiratoy specimens during the acute phase of infection. The lowest concentration of SARS-CoV-2 viral copies this assay can detect is 131 copies/mL. A negative result does not preclude SARS-Cov-2 infection and should not be used as the sole basis for treatment or other patient management decisions. A negative result may occur with  improper specimen collection/handling, submission of specimen other than nasopharyngeal swab, presence of viral mutation(s) within the areas  targeted by this assay, and inadequate number of viral copies (<131 copies/mL). A negative result must be combined with clinical observations, patient history, and epidemiological information. The expected result is Negative.  Fact Sheet for Patients:  PinkCheek.be Fact Sheet for Healthcare Providers:  GravelBags.it This test is not yet ap proved or cleared by the [place] FDA and  has been authorized for detection and/or diagnosis of SARS-CoV-2 by FDA under an Emergency Use Authorization (EUA). This EUA will remain  in effect (meaning this test can be used) for the duration of the COVID-19 declaration under Section 564(b)(1) of the Act, 21 U.S.C. section 360bbb-3(b)(1), unless the authorization is terminated or revoked sooner.    Influenza A by PCR NEGATIVE NEGATIVE   Influenza B by PCR NEGATIVE NEGATIVE    Comment: (NOTE) The Xpert Xpress SARS-CoV-2/FLU/RSV assay is intended as an aid in  the diagnosis of influenza from Nasopharyngeal swab specimens and  should not be used as a sole basis for treatment. Nasal washings and  aspirates are unacceptable for Xpert Xpress SARS-CoV-2/FLU/RSV  testing. Fact Sheet for Patients: PinkCheek.be Fact Sheet for Healthcare Providers: GravelBags.it This test is not yet approved or cleared by the [place] FDA and  has been authorized for detection and/or diagnosis of SARS-CoV-2 by  FDA under an Emergency Use Authorization (EUA). This EUA will remain  in effect (meaning this test can be used) for the duration of the  Covid-19 declaration under Section 564(b)(1) of the Act, 21  U.S.C. section 360bbb-3(b)(1), unless the authorization is  terminated or revoked. Performed at [facility], [address] [place], [place] [address]   SARS CORONAVIRUS 2 (TAT 6-24 HRS) Nasopharyngeal Nasopharyngeal Swab     Status: None   Collection  Time: 03/10/20  4:19 PM   Specimen: Nasopharyngeal Swab  Result Value Ref Range   SARS Coronavirus 2 NEGATIVE NEGATIVE    Comment: (NOTE) SARS-CoV-2 target nucleic acids are NOT DETECTED. The SARS-CoV-2 RNA is generally detectable in upper and lower respiratory specimens during the acute phase of infection. Negative results do not preclude SARS-CoV-2 infection, do not rule out co-infections with other pathogens, and should not be used as the sole basis for treatment or other patient management decisions. Negative results must be combined with clinical observations, patient history, and epidemiological information. The expected result is Negative. Fact Sheet for Patients: SugarRoll.be Fact Sheet for Healthcare Providers: https://www.woods-mathews.com/ This test is not yet approved or cleared by the [place] FDA and  has been authorized for detection and/or diagnosis of SARS-CoV-2 by FDA under an Emergency Use Authorization (EUA). This EUA will remain  in effect (meaning this test can be used) for the duration of the COVID-19 declaration under Section 56 4(b)(1) of the Act, 21 U.S.C. section 360bbb-3(b)(1), unless the authorization is terminated or revoked sooner. Performed at [facility], [place] [address], [place], [place] [address]   MRSA PCR Screening     Status: Abnormal   Collection Time: 03/10/20  6:18 PM   Specimen: Nasal Mucosa; Nasopharyngeal  Result Value Ref Range   MRSA by PCR POSITIVE (A) NEGATIVE    Comment:        The GeneXpert MRSA Assay (FDA approved for NASAL specimens only), is one component of a comprehensive MRSA colonization surveillance program. It is not intended to diagnose MRSA infection nor to guide or monitor treatment for MRSA infections. RESULT CALLED TO, READ BACK BY AND VERIFIED WITH: Kirtland Bouchard @[id number]  03/11/20 [facility] Performed at [facility] [address], [place],  [address]     Glucose, capillary     Status: Abnormal   Collection Time: 03/10/20  6:48 PM  Result Value Ref Range  Glucose-Capillary 102 (H) 70 - 99 mg/dL    Comment: Glucose reference range applies only to samples taken after fasting for at least 8 hours.  Glucose, capillary     Status: None   Collection Time: 03/10/20  7:37 PM  Result Value Ref Range   Glucose-Capillary 99 70 - 99 mg/dL    Comment: Glucose reference range applies only to samples taken after fasting for at least 8 hours.  Blood gas, arterial     Status: Abnormal   Collection Time: 03/10/20  7:55 PM  Result Value Ref Range   FIO2 28.00    Delivery systems NASAL CANNULA    pH, Arterial 7.356 7.350 - 7.450   pCO2 arterial 50.5 (H) 32.0 - 48.0 mmHg   pO2, Arterial 102 83.0 - 108.0 mmHg   Bicarbonate 26.2 20.0 - 28.0 mmol/L   Acid-Base Excess 2.5 (H) 0.0 - 2.0 mmol/L   O2 Saturation 97.3 %   Patient temperature 37.0    Collection site RIGHT RADIAL    Drawn by [id number]    Allens test (pass/fail) PASS PASS    Comment: Performed at [facility], [address] [place], [place] [address]  Glucose, capillary     Status: Abnormal   Collection Time: 03/10/20 11:44 PM  Result Value Ref Range   Glucose-Capillary 100 (H) 70 - 99 mg/dL    Comment: Glucose reference range applies only to samples taken after fasting for at least 8 hours.  Glucose, capillary     Status: None   Collection Time: 03/11/20  3:06 AM  Result Value Ref Range   Glucose-Capillary 97 70 - 99 mg/dL    Comment: Glucose reference range applies only to samples taken after fasting for at least 8 hours.  CBC     Status: Abnormal   Collection Time: 03/11/20  4:04 AM  Result Value Ref Range   WBC 5.8 4.0 - 10.5 K/uL   RBC 3.58 (L) 3.87 - 5.11 MIL/uL   Hemoglobin 9.4 (L) 12.0 - 15.0 g/dL   HCT 32.5 (L) 36.0 - 46.0 %   MCV 90.8 80.0 - 100.0 fL   MCH 26.3 26.0 - 34.0 pg   MCHC 28.9 (L) 30.0 - 36.0 g/dL   RDW 15.0 11.5 - 15.5 %   Platelets 247 150 - 400 K/uL   nRBC 0.0  0.0 - 0.2 %    Comment: Performed at [facility], [address], [place],  [address]  Comprehensive metabolic panel     Status: Abnormal   Collection Time: 03/11/20  4:04 AM  Result Value Ref Range   Sodium 146 (H) 135 - 145 mmol/L   Potassium 3.7 3.5 - 5.1 mmol/L   Chloride 109 98 - 111 mmol/L   CO2 26 22 - 32 mmol/L   Glucose, Bld 104 (H) 70 - 99 mg/dL    Comment: Glucose reference range applies only to samples taken after fasting for at least 8 hours.   BUN 19 8 - 23 mg/dL   Creatinine, Ser 1.67 (H) 0.44 - 1.00 mg/dL   Calcium 8.9 8.9 - 10.3 mg/dL   Total Protein 6.4 (L) 6.5 - 8.1 g/dL   Albumin 3.0 (L) 3.5 - 5.0 g/dL   AST 11 (L) 15 - 41 U/L   ALT 11 0 - 44 U/L   Alkaline Phosphatase 126 38 - 126 U/L   Total Bilirubin 0.7 0.3 - 1.2 mg/dL   GFR calc non Af Amer 28 (L) >60 mL/min   GFR  calc Af Amer 33 (L) >60 mL/min   Anion gap 11 5 - 15    Comment: Performed at [facility], [address] [place], [place] [address]  Lipid panel     Status: Abnormal   Collection Time: 03/11/20  4:04 AM  Result Value Ref Range   Cholesterol 134 0 - 200 mg/dL   Triglycerides 118 <150 mg/dL   HDL 33 (L) >40 mg/dL   Total CHOL/HDL Ratio 4.1 RATIO   VLDL 24 0 - 40 mg/dL   LDL Cholesterol 77 0 - 99 mg/dL    Comment:        Total Cholesterol/HDL:CHD Risk Coronary Heart Disease Risk Table                     Men   Women  1/2 Average Risk   3.4   3.3  Average Risk       5.0   4.4  2 X Average Risk   9.6   7.1  3 X Average Risk  23.4   11.0        Use the calculated Patient Ratio above and the CHD Risk Table to determine the patient's CHD Risk.        ATP III CLASSIFICATION (LDL):  <100     mg/dL   Optimal  100-129  mg/dL   Near or Above                    Optimal  130-159  mg/dL   Borderline  160-189  mg/dL   High  >190     mg/dL   Very High Performed at [place] [place], [place] [address]   Glucose, capillary     Status: None   Collection Time: 03/11/20   7:57 AM  Result Value Ref Range   Glucose-Capillary 85 70 - 99 mg/dL    Comment: Glucose reference range applies only to samples taken after fasting for at least 8 hours.  Glucose, capillary     Status: Abnormal   Collection Time: 03/11/20 11:51 AM  Result Value Ref Range   Glucose-Capillary 113 (H) 70 - 99 mg/dL    Comment: Glucose reference range applies only to samples taken after fasting for at least 8 hours.  Glucose, capillary     Status: Abnormal   Collection Time: 03/11/20  6:03 PM  Result Value Ref Range   Glucose-Capillary 104 (H) 70 - 99 mg/dL    Comment: Glucose reference range applies only to samples taken after fasting for at least 8 hours.    Studies/Results:   FINDINGS: MRI HEAD FINDINGS  Image quality degraded by significant motion.  Diffusion-weighted imaging demonstrates restricted diffusion in the deep white matter on the right compatible with acute infarct.  Ventricle size normal.  Generalized atrophy.  MRA HEAD FINDINGS  Nondiagnostic MRA due to extensive motion.  IMPRESSION: Image quality degraded by extensive motion.  Restricted diffusion in the deep white matter on the right compatible with acute infarction.  MRA head nondiagnostic due to motion.     The brain MRI scan is reviewed in person. It is markedly degraded due to motion artifact but does show acute infarct on DWI involving the right lentiform nucleus. MRA is not useful due to severe motion artifact.   HEAD & NECK CTA FINDINGS: CTA NECK FINDINGS  Aortic arch: Mild calcified plaque along the aortic arch and great vessel origins, which are patent.  Right carotid system: Patent.  There is primarily calcified plaque at the common carotid bifurcation and proximal internal carotid causing mild less than 50% stenosis.  Left carotid system: Patent. There is calcified plaque at the ICA origin causing mild, less than 50% stenosis.  Vertebral arteries: Patent. Left  vertebral artery is dominant. There is calcified plaque at the origins without high-grade stenosis. Severe stenosis of the right V1 segment with reconstitution.  Skeleton: Multilevel degenerative changes of the cervical spine.  Other neck: No mass or adenopathy.  Upper chest: Small bilateral pleural effusions.  Review of the MIP images confirms the above findings  CTA HEAD FINDINGS  Anterior circulation: Intracranial internal carotid arteries are patent with calcified plaque causing moderate stenosis. Middle and anterior cerebral arteries are patent. There is multifocal atherosclerosis. For example, short segment moderate to severe stenosis of the left A2 ACA, mild stenosis of the right M1 MCA, and multiple mild stenoses more distally.  Posterior circulation: Intracranial vertebral arteries are patent. Right vertebral artery becomes small beyond PICA origin with superimposed plaque causing moderate to marked stenosis. Calcified plaque is present along the left vertebral artery causing mild stenosis. Diffuse irregularity and moderate stenosis of the basilar artery. Posterior cerebral arteries are patent with diffuse atherosclerotic irregularity and superimposed stenoses ranging from mild to marked, left greater than right.  Venous sinuses: As permitted by contrast timing, patent.  Review of the MIP images confirms the above findings  CT Brain Perfusion Findings:  CBF (<30%) Volume: 66mL  Perfusion (Tmax>6.0s) volume: 97mL  Mismatch Volume: 16mL  Infarction Location:None  IMPRESSION: No large vessel occlusion. Perfusion imaging identifies no evidence of core infarction or penumbra.  Plaque at the ICA origins with less than 50% stenosis. High-grade stenosis of the non-dominant proximal right vertebral artery with reconstitution.  Multifocal intracranial atherosclerosis involving anterior and posterior circulations.  Partially imaged small bilateral  pleural effusions.         Ron Beske A. Merlene Laughter, M.D.  Diplomate, [employer] and Neurology ( Neurology). 03/11/2020, 7:38 PM

## 2020-03-11 NOTE — Progress Notes (Addendum)
PROGRESS NOTE [facility]   [name]  [id number]  DOB: 01-25-1936  DOA: 03/10/2020 PCP: Jani Gravel, MD   Brief Admission Hx: 84 year old female with chronic atrial fibrillation, diabetes mellitus type 2, hypertension, biventricular heart failure, cerebrovascular disease and coronary artery disease presents with sudden onset of left-sided weakness.  After speaking with her son who is her primary caretaker he tells me that there has been a problem with her medications as she had missed some doses of medications when she was with other family members but he notes that he usually makes sure that she has not missed any doses.  She presents markedly volume overloaded with pulmonary edema and I also worry that she missed some of her Eliquis doses which may have precipitated the CVA.   MDM/Assessment & Plan:   Acute CVA-repeated MRI with light sedation confirms acute infarction.  The patient remains very symptomatic.  She will have to be n.p.o. for now as she is not safe for swallowing according to SLP. Continue stroke workup.  Follow Echo. Await PT/OT eval. Pt likely will need SNF placement however after speaking with son, he may not be agreeable, will address daily with family.  Pt appears to be declining rapidly and I have asked for a palliative medicine consult for goals of care.  Neurology consult requested.  Chronic atrial fibrillation - rate currently controlled, she had been anticoagulated with apixaban but it is likely that she has not been receiving the medication regularly. Hypertension-blood pressures elevated, will allow for permissive hypertension in the setting of the acute CVA and over the course of several days plan to lower blood pressure. History of remote seizure activity-she had been started on Keppra by neurology earlier this year as empiric treatment for suspected unwitnessed seizures.  Keppra IV ordered for now.  HFrEF - Pt is volume overloaded and I suspect has  not been receiving lasix as prescribed.  She has pulmonary edema on chest xray.  She is being diuresed with IV lasix.  Continue to monitor I/O closely and monitor renal function.  CKD stage 3 - Monitoring creatinine closely in setting of IV diuresis.  Type 2 DM - had been controlled on Actos, Januvia,- Would rethink starting her back on actos with her known biventricular CHF, hold januvia while in hospital. Monitor CBGs.  SSI as needed.  Chronic respiratory failure - continue supplemental oxygen.  Gout - resume allopurinol when able.  Dysphagia - NPO for now.  SLP following.  Hypernatremia    DVT prophylaxis: heparin  Code Status: Full  Family Communication: son updated by telephone, there are complex family dynamics involved Disposition Plan: continue inpatient stroke work up, subspecialty consultation pending (neurology), palliative consult, decisions about nutrition, not ready for discharge   Consultants: Neurology Palliative   Procedures:   Antimicrobials:    Subjective: Pt somnolent, barely responds to commands  Objective: Vitals:   03/11/20 1045 03/11/20 1100 03/11/20 1200 03/11/20 1300  BP: (!) 183/87 (!) 184/76 (!) 173/79 (!) 187/96  Pulse: 77 71 71 75  Resp: (!) 23 (!) 22 20 (!) 21  Temp:      TempSrc:      SpO2: 97% 97% 100% 99%  Weight:      Height:        Intake/Output Summary (Last 24 hours) at 03/11/2020 1318 Last data filed at 03/11/2020 0500 Gross per 24 hour  Intake 343.63 ml  Output 1400 ml  Net -1056.37 ml   Filed Weights   03/10/20  1317 03/10/20 1853  Weight: 90.7 kg 86.3 kg    REVIEW OF SYSTEMS  UTO due to current condition   Exam:  General exam: elderly frail female, awake, somnolent, NAD.  Respiratory system: Clear. No increased work of breathing. Cardiovascular system: irregularly irregular, S1 & S2 heard. Mild JVD, 1+ pedal edema. Gastrointestinal system: Abdomen is nondistended, soft and nontender. Normal bowel sounds  heard. Central nervous system: somnolent, left facial droop, pronounced left hemiparesis. Extremities: 1+ edema BLEs.   Data Reviewed: Basic Metabolic Panel: Recent Labs  Lab 03/10/20 1312 03/11/20 0404  NA 144  144 146*  K 3.5  3.5 3.7  CL 109  108 109  CO2 26 26  GLUCOSE 153*  151* 104*  BUN 20  19 19   CREATININE 1.71*  1.80* 1.67*  CALCIUM 8.9 8.9  MG 2.1  --    Liver Function Tests: Recent Labs  Lab 03/10/20 1312 03/11/20 0404  AST 13* 11*  ALT 12 11  ALKPHOS 149* 126  BILITOT 0.3 0.7  PROT 6.8 6.4*  ALBUMIN 3.1* 3.0*   No results for input(s): LIPASE, AMYLASE in the last 168 hours. No results for input(s): AMMONIA in the last 168 hours. CBC: Recent Labs  Lab 03/10/20 1312 03/11/20 0404  WBC 5.3 5.8  NEUTROABS 3.4  --   HGB 10.9*  10.1* 9.4*  HCT 32.0*  33.9* 32.5*  MCV 89.0 90.8  PLT 242 247   Cardiac Enzymes: No results for input(s): CKTOTAL, CKMB, CKMBINDEX, TROPONINI in the last 168 hours. CBG (last 3)  Recent Labs    03/11/20 0306 03/11/20 0757 03/11/20 1151  GLUCAP 97 85 113*   Recent Results (from the past 240 hour(s))  Urine culture     Status: Abnormal (Preliminary result)   Collection Time: 03/10/20  3:38 PM   Specimen: Urine, Random  Result Value Ref Range Status   Specimen Description   Final    URINE, RANDOM Performed at [facility], [address] [place], [place] [address]    Special Requests   Final    NONE Performed at [facility], [address], [place], [place] [address]    Culture (A)  Final    >=100,000 COLONIES/mL GRAM NEGATIVE RODS SUSCEPTIBILITIES TO FOLLOW Performed at [facility], [place] [address], [place], [place] [address]    Report Status PENDING  Incomplete  Respiratory Panel by RT PCR (Flu A&B, Covid) - Nasopharyngeal Swab     Status: None   Collection Time: 03/10/20  3:39 PM   Specimen: Nasopharyngeal Swab  Result Value Ref Range Status   SARS Coronavirus 2 by RT PCR NEGATIVE NEGATIVE  Final    Comment: (NOTE) SARS-CoV-2 target nucleic acids are NOT DETECTED. The SARS-CoV-2 RNA is generally detectable in upper respiratoy specimens during the acute phase of infection. The lowest concentration of SARS-CoV-2 viral copies this assay can detect is 131 copies/mL. A negative result does not preclude SARS-Cov-2 infection and should not be used as the sole basis for treatment or other patient management decisions. A negative result may occur with  improper specimen collection/handling, submission of specimen other than nasopharyngeal swab, presence of viral mutation(s) within the areas targeted by this assay, and inadequate number of viral copies (<131 copies/mL). A negative result must be combined with clinical observations, patient history, and epidemiological information. The expected result is Negative. Fact Sheet for Patients:  PinkCheek.be Fact Sheet for Healthcare Providers:  GravelBags.it This test is not yet ap proved or cleared by  the [employer] and  has been authorized for detection and/or diagnosis of SARS-CoV-2 by FDA under an Emergency Use Authorization (EUA). This EUA will remain  in effect (meaning this test can be used) for the duration of the COVID-19 declaration under Section 564(b)(1) of the Act, 21 U.S.C. section 360bbb-3(b)(1), unless the authorization is terminated or revoked sooner.    Influenza A by PCR NEGATIVE NEGATIVE Final   Influenza B by PCR NEGATIVE NEGATIVE Final    Comment: (NOTE) The Xpert Xpress SARS-CoV-2/FLU/RSV assay is intended as an aid in  the diagnosis of influenza from Nasopharyngeal swab specimens and  should not be used as a sole basis for treatment. Nasal washings and  aspirates are unacceptable for Xpert Xpress SARS-CoV-2/FLU/RSV  testing. Fact Sheet for Patients: PinkCheek.be Fact Sheet for Healthcare  Providers: GravelBags.it This test is not yet approved or cleared by the [place] FDA and  has been authorized for detection and/or diagnosis of SARS-CoV-2 by  FDA under an Emergency Use Authorization (EUA). This EUA will remain  in effect (meaning this test can be used) for the duration of the  Covid-19 declaration under Section 564(b)(1) of the Act, 21  U.S.C. section 360bbb-3(b)(1), unless the authorization is  terminated or revoked. Performed at [facility], [address], [place], [place] [address]   SARS CORONAVIRUS 2 (TAT 6-24 HRS) Nasopharyngeal Nasopharyngeal Swab     Status: None   Collection Time: 03/10/20  4:19 PM   Specimen: Nasopharyngeal Swab  Result Value Ref Range Status   SARS Coronavirus 2 NEGATIVE NEGATIVE Final    Comment: (NOTE) SARS-CoV-2 target nucleic acids are NOT DETECTED. The SARS-CoV-2 RNA is generally detectable in upper and lower respiratory specimens during the acute phase of infection. Negative results do not preclude SARS-CoV-2 infection, do not rule out co-infections with other pathogens, and should not be used as the sole basis for treatment or other patient management decisions. Negative results must be combined with clinical observations, patient history, and epidemiological information. The expected result is Negative. Fact Sheet for Patients: SugarRoll.be Fact Sheet for Healthcare Providers: https://www.woods-mathews.com/ This test is not yet approved or cleared by the [place] FDA and  has been authorized for detection and/or diagnosis of SARS-CoV-2 by FDA under an Emergency Use Authorization (EUA). This EUA will remain  in effect (meaning this test can be used) for the duration of the COVID-19 declaration under Section 56 4(b)(1) of the Act, 21 U.S.C. section 360bbb-3(b)(1), unless the authorization is terminated or revoked sooner. Performed at [facility], [place] [address], [place], [place] [address]   MRSA PCR Screening     Status: Abnormal   Collection Time: 03/10/20  6:18 PM   Specimen: Nasal Mucosa; Nasopharyngeal  Result Value Ref Range Status   MRSA by PCR POSITIVE (A) NEGATIVE Final    Comment:        The GeneXpert MRSA Assay (FDA approved for NASAL specimens only), is one component of a comprehensive MRSA colonization surveillance program. It is not intended to diagnose MRSA infection nor to guide or monitor treatment for MRSA infections. RESULT CALLED TO, READ BACK BY AND VERIFIED WITH: Kirtland Bouchard @0142  03/11/20 [facility] Performed at [facility], [address], [place], [place] [address]      Studies: CT Angio Head W or Wo Contrast  Result Date: 03/10/2020 CLINICAL DATA:  Left-sided weakness, code stroke follow-up EXAM: CT ANGIOGRAPHY HEAD AND NECK CT PERFUSION BRAIN TECHNIQUE: Multidetector CT imaging of the head and  neck was performed using the standard protocol during bolus administration of intravenous contrast. Multiplanar CT image reconstructions and MIPs were obtained to evaluate the vascular anatomy. Carotid stenosis measurements (when applicable) are obtained utilizing NASCET criteria, using the distal internal carotid diameter as the denominator. Multiphase CT imaging of the brain was performed following IV bolus contrast injection. Subsequent parametric perfusion maps were calculated using RAPID software. CONTRAST:  164mL OMNIPAQUE IOHEXOL 350 MG/ML SOLN COMPARISON:  MRA head 07/12/1920 FINDINGS: CTA NECK FINDINGS Aortic arch: Mild calcified plaque along the aortic arch and great vessel origins, which are patent. Right carotid system: Patent. There is primarily calcified plaque at the common carotid bifurcation and proximal internal carotid causing mild less than 50% stenosis. Left carotid system: Patent. There is calcified plaque at the ICA origin causing mild, less than 50% stenosis. Vertebral arteries: Patent.  Left vertebral artery is dominant. There is calcified plaque at the origins without high-grade stenosis. Severe stenosis of the right V1 segment with reconstitution. Skeleton: Multilevel degenerative changes of the cervical spine. Other neck: No mass or adenopathy. Upper chest: Small bilateral pleural effusions. Review of the MIP images confirms the above findings CTA HEAD FINDINGS Anterior circulation: Intracranial internal carotid arteries are patent with calcified plaque causing moderate stenosis. Middle and anterior cerebral arteries are patent. There is multifocal atherosclerosis. For example, short segment moderate to severe stenosis of the left A2 ACA, mild stenosis of the right M1 MCA, and multiple mild stenoses more distally. Posterior circulation: Intracranial vertebral arteries are patent. Right vertebral artery becomes small beyond PICA origin with superimposed plaque causing moderate to marked stenosis. Calcified plaque is present along the left vertebral artery causing mild stenosis. Diffuse irregularity and moderate stenosis of the basilar artery. Posterior cerebral arteries are patent with diffuse atherosclerotic irregularity and superimposed stenoses ranging from mild to marked, left greater than right. Venous sinuses: As permitted by contrast timing, patent. Review of the MIP images confirms the above findings CT Brain Perfusion Findings: CBF (<30%) Volume: 51mL Perfusion (Tmax>6.0s) volume: 57mL Mismatch Volume: 37mL Infarction Location:None IMPRESSION: No large vessel occlusion. Perfusion imaging identifies no evidence of core infarction or penumbra. Plaque at the ICA origins with less than 50% stenosis. High-grade stenosis of the non-dominant proximal right vertebral artery with reconstitution. Multifocal intracranial atherosclerosis involving anterior and posterior circulations. Partially imaged small bilateral pleural effusions. Initial results were called by telephone at the time of  interpretation on 03/10/2020 at 1:59 pm to provider [facility] , who verbally acknowledged these results. Electronically Signed   By: Macy Mis M.D.   On: 03/10/2020 14:05   CT Angio Neck W and/or Wo Contrast  Result Date: 03/10/2020 CLINICAL DATA:  Left-sided weakness, code stroke follow-up EXAM: CT ANGIOGRAPHY HEAD AND NECK CT PERFUSION BRAIN TECHNIQUE: Multidetector CT imaging of the head and neck was performed using the standard protocol during bolus administration of intravenous contrast. Multiplanar CT image reconstructions and MIPs were obtained to evaluate the vascular anatomy. Carotid stenosis measurements (when applicable) are obtained utilizing NASCET criteria, using the distal internal carotid diameter as the denominator. Multiphase CT imaging of the brain was performed following IV bolus contrast injection. Subsequent parametric perfusion maps were calculated using RAPID software. CONTRAST:  137mL OMNIPAQUE IOHEXOL 350 MG/ML SOLN COMPARISON:  MRA head 07/12/1920 FINDINGS: CTA NECK FINDINGS Aortic arch: Mild calcified plaque along the aortic arch and great vessel origins, which are patent. Right carotid system: Patent. There is primarily calcified plaque at the common carotid bifurcation and proximal internal carotid causing  mild less than 50% stenosis. Left carotid system: Patent. There is calcified plaque at the ICA origin causing mild, less than 50% stenosis. Vertebral arteries: Patent. Left vertebral artery is dominant. There is calcified plaque at the origins without high-grade stenosis. Severe stenosis of the right V1 segment with reconstitution. Skeleton: Multilevel degenerative changes of the cervical spine. Other neck: No mass or adenopathy. Upper chest: Small bilateral pleural effusions. Review of the MIP images confirms the above findings CTA HEAD FINDINGS Anterior circulation: Intracranial internal carotid arteries are patent with calcified plaque causing moderate stenosis. Middle  and anterior cerebral arteries are patent. There is multifocal atherosclerosis. For example, short segment moderate to severe stenosis of the left A2 ACA, mild stenosis of the right M1 MCA, and multiple mild stenoses more distally. Posterior circulation: Intracranial vertebral arteries are patent. Right vertebral artery becomes small beyond PICA origin with superimposed plaque causing moderate to marked stenosis. Calcified plaque is present along the left vertebral artery causing mild stenosis. Diffuse irregularity and moderate stenosis of the basilar artery. Posterior cerebral arteries are patent with diffuse atherosclerotic irregularity and superimposed stenoses ranging from mild to marked, left greater than right. Venous sinuses: As permitted by contrast timing, patent. Review of the MIP images confirms the above findings CT Brain Perfusion Findings: CBF (<30%) Volume: 73mL Perfusion (Tmax>6.0s) volume: 46mL Mismatch Volume: 77mL Infarction Location:None IMPRESSION: No large vessel occlusion. Perfusion imaging identifies no evidence of core infarction or penumbra. Plaque at the ICA origins with less than 50% stenosis. High-grade stenosis of the non-dominant proximal right vertebral artery with reconstitution. Multifocal intracranial atherosclerosis involving anterior and posterior circulations. Partially imaged small bilateral pleural effusions. Initial results were called by telephone at the time of interpretation on 03/10/2020 at 1:59 pm to provider [facility] , who verbally acknowledged these results. Electronically Signed   By: Macy Mis M.D.   On: 03/10/2020 14:05   MR ANGIO HEAD WO CONTRAST  Result Date: 03/11/2020 CLINICAL DATA:  Focal neuro deficit.  Left-sided weakness. EXAM: MRI HEAD WITHOUT CONTRAST MRA HEAD WITHOUT CONTRAST TECHNIQUE: Multiplanar, multiecho pulse sequences of the brain and surrounding structures were obtained without intravenous contrast. Angiographic images of the head were  obtained using MRA technique without contrast. COMPARISON:  MRI head 03/10/2020 FINDINGS: MRI HEAD FINDINGS Image quality degraded by significant motion. Diffusion-weighted imaging demonstrates restricted diffusion in the deep white matter on the right compatible with acute infarct. Ventricle size normal.  Generalized atrophy. MRA HEAD FINDINGS Nondiagnostic MRA due to extensive motion. IMPRESSION: Image quality degraded by extensive motion. Restricted diffusion in the deep white matter on the right compatible with acute infarction. MRA head nondiagnostic due to motion. Electronically Signed   By: Franchot Gallo M.D.   On: 03/11/2020 11:05   MR BRAIN WO CONTRAST  Result Date: 03/11/2020 CLINICAL DATA:  Focal neuro deficit.  Left-sided weakness. EXAM: MRI HEAD WITHOUT CONTRAST MRA HEAD WITHOUT CONTRAST TECHNIQUE: Multiplanar, multiecho pulse sequences of the brain and surrounding structures were obtained without intravenous contrast. Angiographic images of the head were obtained using MRA technique without contrast. COMPARISON:  MRI head 03/10/2020 FINDINGS: MRI HEAD FINDINGS Image quality degraded by significant motion. Diffusion-weighted imaging demonstrates restricted diffusion in the deep white matter on the right compatible with acute infarct. Ventricle size normal.  Generalized atrophy. MRA HEAD FINDINGS Nondiagnostic MRA due to extensive motion. IMPRESSION: Image quality degraded by extensive motion. Restricted diffusion in the deep white matter on the right compatible with acute infarction. MRA head nondiagnostic due to motion. Electronically  Signed   By: Franchot Gallo M.D.   On: 03/11/2020 11:05   MR BRAIN WO CONTRAST  Result Date: 03/10/2020 CLINICAL DATA:  Encephalopathy EXAM: MRI HEAD WITHOUT CONTRAST TECHNIQUE: Multiplanar, multiecho pulse sequences of the brain and surrounding structures were obtained without intravenous contrast. COMPARISON:  None. FINDINGS: Attempted sequences are highly  motion degraded. Diffusion sequence is nondiagnostic. IMPRESSION: Nondiagnostic study. Recommend repeating when possible with sedation. Electronically Signed   By: Macy Mis M.D.   On: 03/10/2020 15:18   CT CEREBRAL PERFUSION W CONTRAST  Result Date: 03/10/2020 CLINICAL DATA:  Left-sided weakness, code stroke follow-up EXAM: CT ANGIOGRAPHY HEAD AND NECK CT PERFUSION BRAIN TECHNIQUE: Multidetector CT imaging of the head and neck was performed using the standard protocol during bolus administration of intravenous contrast. Multiplanar CT image reconstructions and MIPs were obtained to evaluate the vascular anatomy. Carotid stenosis measurements (when applicable) are obtained utilizing NASCET criteria, using the distal internal carotid diameter as the denominator. Multiphase CT imaging of the brain was performed following IV bolus contrast injection. Subsequent parametric perfusion maps were calculated using RAPID software. CONTRAST:  169mL OMNIPAQUE IOHEXOL 350 MG/ML SOLN COMPARISON:  MRA head 07/12/1920 FINDINGS: CTA NECK FINDINGS Aortic arch: Mild calcified plaque along the aortic arch and great vessel origins, which are patent. Right carotid system: Patent. There is primarily calcified plaque at the common carotid bifurcation and proximal internal carotid causing mild less than 50% stenosis. Left carotid system: Patent. There is calcified plaque at the ICA origin causing mild, less than 50% stenosis. Vertebral arteries: Patent. Left vertebral artery is dominant. There is calcified plaque at the origins without high-grade stenosis. Severe stenosis of the right V1 segment with reconstitution. Skeleton: Multilevel degenerative changes of the cervical spine. Other neck: No mass or adenopathy. Upper chest: Small bilateral pleural effusions. Review of the MIP images confirms the above findings CTA HEAD FINDINGS Anterior circulation: Intracranial internal carotid arteries are patent with calcified plaque causing  moderate stenosis. Middle and anterior cerebral arteries are patent. There is multifocal atherosclerosis. For example, short segment moderate to severe stenosis of the left A2 ACA, mild stenosis of the right M1 MCA, and multiple mild stenoses more distally. Posterior circulation: Intracranial vertebral arteries are patent. Right vertebral artery becomes small beyond PICA origin with superimposed plaque causing moderate to marked stenosis. Calcified plaque is present along the left vertebral artery causing mild stenosis. Diffuse irregularity and moderate stenosis of the basilar artery. Posterior cerebral arteries are patent with diffuse atherosclerotic irregularity and superimposed stenoses ranging from mild to marked, left greater than right. Venous sinuses: As permitted by contrast timing, patent. Review of the MIP images confirms the above findings CT Brain Perfusion Findings: CBF (<30%) Volume: 42mL Perfusion (Tmax>6.0s) volume: 53mL Mismatch Volume: 73mL Infarction Location:None IMPRESSION: No large vessel occlusion. Perfusion imaging identifies no evidence of core infarction or penumbra. Plaque at the ICA origins with less than 50% stenosis. High-grade stenosis of the non-dominant proximal right vertebral artery with reconstitution. Multifocal intracranial atherosclerosis involving anterior and posterior circulations. Partially imaged small bilateral pleural effusions. Initial results were called by telephone at the time of interpretation on 03/10/2020 at 1:59 pm to provider [facility] , who verbally acknowledged these results. Electronically Signed   By: Macy Mis M.D.   On: 03/10/2020 14:05   DG CHEST PORT 1 VIEW  Result Date: 03/11/2020 CLINICAL DATA:  Patient admitted 03/10/2020 with left side weakness and metabolic encephalopathy. Congestive heart failure. EXAM: PORTABLE CHEST 1 VIEW COMPARISON:  CT chest  12/26/2019. Single-view of the chest 03/10/2020, 12/28/2019 and 12/26/2019. FINDINGS: Opacity  at the left lung base has improved consistent with decreased atelectasis or effusion. There is cardiomegaly and interstitial edema. Small right pleural effusion noted. No pneumothorax. Atherosclerosis. IMPRESSION: Improved aeration left lung base. Cardiomegaly and interstitial edema with small bilateral pleural effusions. Electronically Signed   By: Inge Rise M.D.   On: 03/11/2020 09:58   DG Chest Port 1 View  Result Date: 03/10/2020 CLINICAL DATA:  Tachypnea EXAM: PORTABLE CHEST 1 VIEW COMPARISON:  December 28, 2019 FINDINGS: The right-sided PICC line has been removed. The heart size is enlarged. The pulmonary vasculature is dilated. Hazy bilateral airspace opacities are noted with prominent interstitial lung markings and Kerley B lines. There is consolidation versus atelectasis at the left lung base. Small bilateral pleural effusions are noted. Aortic calcifications are noted. IMPRESSION: Cardiomegaly with pulmonary vascular congestion and pulmonary edema. Small bilateral pleural effusions. Consolidation versus atelectasis at the left lung base. Dilated pulmonary arteries which can be seen in patients with elevated pulmonary artery pressures. Electronically Signed   By: Constance Holster M.D.   On: 03/10/2020 14:56   ECHOCARDIOGRAM COMPLETE  Result Date: 03/11/2020    ECHOCARDIOGRAM REPORT   Patient Name:   CECI RAVENS Date of Exam: 03/11/2020 Medical Rec #:  [id number]       Height:       65.0 in Accession #:    [id number]      Weight:       190.3 lb Date of Birth:  1936/11/10      BSA:          1.937 m Patient Age:    43 years        BP:           184/76 mmHg Patient Gender: F               HR:           78 bpm. Exam Location:  [facility] Procedure: 2D Echo Indications:    Stroke 434.91 / I163.9  History:        Patient has prior history of Echocardiogram examinations, most                 recent 04/25/2018. CHF, Stroke, Arrythmias:Atrial Fibrillation,                 Signs/Symptoms:Dyspnea;  Risk Factors:Diabetes and Non-Smoker.                 Mitral Regurgitation.  Sonographer:    Leavy Cella RDCS (AE) Referring Phys: [place]  1. Left ventricular ejection fraction, by estimation, is 40 to 45%. The left ventricle has mildly decreased function. The left ventricle demonstrates global hypokinesis. There is moderate concentric left ventricular hypertrophy. Left ventricular diastolic parameters are consistent with Grade II diastolic dysfunction (pseudonormalization). Elevated left ventricular end-diastolic pressure.  2. Right ventricular systolic function is normal. The right ventricular size is normal. There is moderately elevated pulmonary artery systolic pressure.  3. The mitral valve is degenerative. Mild to moderate mitral valve regurgitation.  4. The aortic valve is tricuspid. Aortic valve regurgitation is not visualized. No aortic stenosis is present.  5. The inferior vena cava is normal in size with greater than 50% respiratory variability, suggesting right atrial pressure of 3 mmHg. FINDINGS  Left Ventricle: Left ventricular ejection fraction, by estimation, is 40 to 45%. The left ventricle has mildly decreased function. The left ventricle  demonstrates global hypokinesis. The left ventricular internal cavity size was normal in size. There is  moderate concentric left ventricular hypertrophy. Left ventricular diastolic parameters are consistent with Grade II diastolic dysfunction (pseudonormalization). Elevated left ventricular end-diastolic pressure. Right Ventricle: The right ventricular size is normal. No increase in right ventricular wall thickness. Right ventricular systolic function is normal. There is moderately elevated pulmonary artery systolic pressure. The tricuspid regurgitant velocity is 3.29 m/s, and with an assumed right atrial pressure of 10 mmHg, the estimated right ventricular systolic pressure is [id number] mmHg. Left Atrium: Left atrial size was normal  in size. Right Atrium: Right atrial size was normal in size. Pericardium: There is no evidence of pericardial effusion. Mitral Valve: The mitral valve is degenerative in appearance. There is mild thickening of the mitral valve leaflet(s). Mild mitral annular calcification. Mild to moderate mitral valve regurgitation. Tricuspid Valve: The tricuspid valve is grossly normal. Tricuspid valve regurgitation is mild. Aortic Valve: The aortic valve is tricuspid. Aortic valve regurgitation is not visualized. No aortic stenosis is present. Pulmonic Valve: The pulmonic valve was grossly normal. Pulmonic valve regurgitation is not visualized. Aorta: The aortic root is normal in size and structure. Venous: The inferior vena cava is normal in size with greater than 50% respiratory variability, suggesting right atrial pressure of 3 mmHg. IAS/Shunts: No atrial level shunt detected by color flow Doppler.  LEFT VENTRICLE PLAX 2D LVIDd:         4.55 cm  Diastology LVIDs:         3.66 cm  LV e' lateral:   5.48 cm/s LV PW:         1.62 cm  LV E/e' lateral: 17.2 LV IVS:        1.43 cm  LV e' medial:    4.35 cm/s LVOT diam:     2.00 cm  LV E/e' medial:  21.7 LVOT Area:     3.14 cm  RIGHT VENTRICLE TAPSE (M-mode): 2.5 cm LEFT ATRIUM           Index LA diam:      3.40 cm 1.76 cm/m LA Vol (A2C): 66.3 ml 34.24 ml/m LA Vol (A4C): 58.6 ml 30.26 ml/m   AORTA Ao Root diam: 2.90 cm MITRAL VALVE               TRICUSPID VALVE MV Area (PHT): 4.39 cm    TR Peak grad:   43.3 mmHg MV Decel Time: 173 msec    TR Vmax:        329.00 cm/s MR Peak grad: 108.6 mmHg MR Mean grad: 71.0 mmHg    SHUNTS MR Vmax:      521.00 cm/s  Systemic Diam: 2.00 cm MR Vmean:     400.0 cm/s MV E velocity: 94.30 cm/s MV A velocity: 60.00 cm/s MV E/A ratio:  1.57 Kate Sable MD Electronically signed by Kate Sable MD Signature Date/Time: 03/11/2020/11:47:23 AM    Final    CT HEAD CODE STROKE WO CONTRAST  Result Date: 03/10/2020 CLINICAL DATA:  Code stroke.  Acute onset of left upper and lower extremity weakness and left facial droop as well as slurred speech beginning 1 hour ago. EXAM: CT HEAD WITHOUT CONTRAST TECHNIQUE: Contiguous axial images were obtained from the base of the skull through the vertex without intravenous contrast. COMPARISON:  CT head without contrast 12/26/2019 and MR head without contrast 12/31/2019. FINDINGS: Brain: Chronic encephalomalacia of the right frontal lobe white matter is stable. Diffuse white matter hypoattenuation is present  bilaterally. Remote medial left occipital lobe infarct is present. Moderate generalized atrophy is noted. The ventricles are proportionate to the degree of atrophy. A remote posterior left cerebellar infarct is stable. Remote lacunar infarcts of the right cerebellum are stable. The brainstem is otherwise unremarkable. Vascular: Dense atherosclerotic calcifications are again noted within the cavernous internal carotid arteries bilaterally as well as the V3 segments of both vertebral arteries. No hyperdense vessel is present. Skull: Calvarium is intact. No focal lytic or blastic lesions are present. No significant extracranial soft tissue lesion is present. Sinuses/Orbits: The paranasal sinuses and mastoid air cells are clear. The globes and orbits are within normal limits. ASPECTS [facility] Stroke Program Early CT Score) - Ganglionic level infarction (caudate, lentiform nuclei, internal capsule, insula, M1-M3 cortex): 7/7 - Supraganglionic infarction (M4-M6 cortex): 3/3 Total score (0-10 with 10 being normal): 10/10 IMPRESSION: 1. No acute intracranial abnormality or significant interval change. 2. Stable chronic encephalomalacia involving the right frontal white matter, the medial left occipital lobe, and posterior left cerebellum. 3. Moderate generalized atrophy and white matter disease likely reflects the sequela of chronic microvascular ischemia. 4. Stable remote lacunar infarcts of the right cerebellum and  bilateral basal ganglia. 5. ASPECTS is 10/10 6. Extensive atherosclerotic disease without a hyperdense vessel. These results were called by telephone at the time of interpretation on 03/10/2020 at 1:26 pm to provider [facility] , who verbally acknowledged these results. Electronically Signed   By: San Morelle M.D.   On: 03/10/2020 13:28   Scheduled Meds:  Chlorhexidine Gluconate Cloth  6 each Topical Q0600   furosemide  40 mg Intravenous Q12H   heparin  5,000 Units Subcutaneous Q8H   insulin aspart  0-9 Units Subcutaneous Q4H   mupirocin ointment  1 application Nasal BID   Continuous Infusions:  levETIRAcetam 500 mg (03/11/20 0238)    Principal Problem:   Acute metabolic encephalopathy Active Problems:   Type 2 diabetes mellitus with hyperlipidemia (HCC)   Essential hypertension   CVA (cerebral vascular accident) ([place])   Acute on chronic combined systolic and diastolic CHF (congestive heart failure) (HCC)   Right bundle branch block   Atrial fibrillation with RVR (HCC)   Atrial fibrillation, chronic ([place])   Left-sided weakness  Time spent:   Irwin Brakeman, MD [facility] 03/11/2020, 1:18 PM    LOS: 1 day  How to contact the [facility] Attending or Consulting provider [place] or covering provider during after hours [place], for this patient?  Check the care team in [facility] and look for a) attending/consulting [facility] provider listed and b) the [facility] team listed Log into [email or web address] and use [place]'s universal password to access. If you do not have the password, please contact the hospital operator. Locate the [facility] provider you are looking for under [facility] and page to a number that you can be directly reached. If you still have difficulty reaching the provider, please page the [facility] (Director on Call) for the Hospitalists listed on amion for assistance.

## 2020-03-11 NOTE — Consult Note (Signed)
Consultation Note Date: 03/11/2020   Patient Name: [name]  DOB: January 23, 1936  MRN: [id number]  Age / Sex: 84 y.o., female  PCP: Jani Gravel, MD Referring Physician: Murlean Iba, MD  Reason for Consultation: Establishing goals of care  HPI/Patient Profile: 84 y.o. female  with past medical history of stroke, dementia, CAD, combined systolic/diastolic CHF (EF 99991111), 2L oxygen at home, atrial fibrillation on Eliquis, diabetes, HTN, CKD stage 3b. Hospitalization 12/26/19-01/01/20 with UTI and aspiration pneumonia and concern for possible seizure. Admitted on 03/10/2020 with slurred speech and left facial droop with worsening weakness left side worse than right. CT head with no evidence of stroke. Treating for UTI and probable pneumonia as well as pulmonary edema. MRI brain attempted 03/10/20 but with motion artifact.   Clinical Assessment and Goals of Care: I have reviewed records, discussed with RN and SLP, and see MRI results for right brain infarct.   I called and spoke with son, [name], who [name] was going to live with and had recently moved in with him the past few days. He shares that he was just told that she has had another stroke. I express my concern over recurrent stroke and overall poor prognosis with severe dysphagia and constant drooling with copious secretions that she cannot manage. I explained that this is a very poor sign that leads to very poor prognosis. I explained that she will not be expected to be able to sustain herself as she is unable to eat/drink. Discussed risk of aspiration which could lead to respiratory compromise and failure. I explained that family is faced with very difficult decisions on how to move forward such as artificial feeding/feeding tube and desire for resuscitation knowing that we cannot reverse damage from her strokes and overall declining health. [name]  understands and alludes to his mother being tired and knowing that her health has been declining.   [name] has 13 children per [name] and no documented HCPOA. [name] will reach out and speak with his siblings about the decisions at hand. I did suggest that we consider meeting with children to discuss further but [name] would like to speak with his siblings first and for [place] to call him again tomorrow to see what family discuss and if they can come to a decision. I also called and spoke with [name] as she and Katie Fila do not speak and reiterated the same as above to her. Again encouraged family communication.   Family will need further discussion and education on expectations and consideration of comfort focused care vs aggressive management and what these paths look like.   All questions/concerns addressed. Emotional support provided.   Primary Decision Maker NEXT OF KIN 13 adult children (reasonable majority)    SUMMARY OF RECOMMENDATIONS   - Ongoing family discussions - Will need further education and [place] conversation  Code Status/Advance Care Planning:  Full code - recommended consideration of DNR   Symptom Management:   Per primary and neurology.   Palliative Prophylaxis:   Aspiration, Delirium Protocol,  Frequent Pain Assessment, Oral Care and Turn Reposition   Psycho-social/Spiritual:   Desire for further Chaplaincy support:no  Additional Recommendations: Education on Hospice and Grief/Bereavement Support  Prognosis:  Overall prognosis very poor with recurrent stroke. High risk for aspiration and acute decompensation.   Discharge Planning: To Be Determined      Primary Diagnoses: Present on Admission: . Acute metabolic encephalopathy . Type 2 diabetes mellitus with hyperlipidemia ([place]) . CVA (cerebral vascular accident) ([place]) . Atrial fibrillation with RVR ([place]) . Acute on chronic combined systolic and diastolic CHF (congestive heart failure) ([place]) . Right bundle  branch block . Atrial fibrillation, chronic ([place]) . Essential hypertension   I have reviewed the medical record, interviewed the patient and family, and examined the patient. The following aspects are pertinent.  Past Medical History:  Diagnosis Date  . Asthma   . Bronchitis   . Carotid artery disease ([place])   . Coronary atherosclerosis of native coronary artery    Nonobstructive at cath 2005  . DM2 (diabetes mellitus, type 2) ([place])   . Essential hypertension   . Gout   . History of stroke   . Hypothyroidism   . Influenza 2013  . Nonischemic cardiomyopathy (HCC)    LVEF 20-25%  . PAF (paroxysmal atrial fibrillation) ([place])    a. diagnosed in 04/2018. Started on Eliquis for anticoagulation.   . Renal artery stenosis (HCC)    Bilateral   Social History   Socioeconomic History  . Marital status: Single    Spouse name: Not on file  . Number of children: 14  . Years of education: Not on file  . Highest education level: Not on file  Occupational History  . Occupation: Retired  Tobacco Use  . Smoking status: Never Smoker  . Smokeless tobacco: Never Used  Substance and Sexual Activity  . Alcohol use: No    Alcohol/week: 0.0 standard drinks  . Drug use: No  . Sexual activity: Never    Birth control/protection: Surgical  Other Topics Concern  . Not on file  Social History Narrative  . Not on file   Social Determinants of Health   Financial Resource Strain:   . Difficulty of Paying Living Expenses:   Food Insecurity:   . Worried About Charity fundraiser in the Last Year:   . Arboriculturist in the Last Year:   Transportation Needs:   . Film/video editor (Medical):   Marland Kitchen Lack of Transportation (Non-Medical):   Physical Activity:   . Days of Exercise per Week:   . Minutes of Exercise per Session:   Stress:   . Feeling of Stress :   Social Connections:   . Frequency of Communication with Friends and Family:   . Frequency of Social Gatherings with Friends and  Family:   . Attends Religious Services:   . Active Member of Clubs or Organizations:   . Attends Archivist Meetings:   Marland Kitchen Marital Status:    Family History  Problem Relation Age of Onset  . Asthma Son    Scheduled Meds: . Chlorhexidine Gluconate Cloth  6 each Topical Q0600  . furosemide  40 mg Intravenous Q12H  . heparin  5,000 Units Subcutaneous Q8H  . insulin aspart  0-9 Units Subcutaneous Q4H  . mupirocin ointment  1 application Nasal BID   Continuous Infusions: . levETIRAcetam 500 mg (03/11/20 0238)   PRN Meds:.acetaminophen **OR** acetaminophen, iohexol, labetalol, LORazepam, ondansetron **OR** ondansetron (ZOFRAN) IV Allergies  Allergen Reactions  .  Yellow Jacket Venom [Bee Venom] Shortness Of Breath and Itching   Review of Systems  Unable to perform ROS: Acuity of condition    Physical Exam Vitals and nursing note reviewed.  Constitutional:      Appearance: She is ill-appearing.  Cardiovascular:     Rate and Rhythm: Normal rate.  Pulmonary:     Effort: No tachypnea, accessory muscle usage or respiratory distress.     Comments: Copious secretions spilling from mouth. Very high risk for aspiration.  Abdominal:     General: Abdomen is flat.     Palpations: Abdomen is soft.  Neurological:     Mental Status: She is easily aroused. She is confused.     Comments: No movement or response to left extremities (no withdrawal to pain). Swats away with right arm when attempting oral suction. Easily agitated. Does not open eyes or follow commands. She did attempt to respond verbally to me but I was unable to understand her.   Psychiatric:        Behavior: Behavior is uncooperative.     Vital Signs: BP (!) 172/86   Pulse 63   Temp 97.6 F (36.4 C) (Axillary)   Resp 15   Ht 5\' 5"  (1.651 m)   Wt 86.3 kg   SpO2 95%   BMI 31.66 kg/m  Pain Scale: 0-10   Pain Score: Asleep   SpO2: SpO2: 95 % O2 Device:SpO2: 95 % O2 Flow Rate: .O2 Flow Rate (L/min): 2  L/min  IO: Intake/output summary:   Intake/Output Summary (Last 24 hours) at 03/11/2020 0856 Last data filed at 03/11/2020 0500 Gross per 24 hour  Intake 343.63 ml  Output 1400 ml  Net -1056.37 ml    LBM: Last BM Date: (unknown) Baseline Weight: Weight: 90.7 kg Most recent weight: Weight: 86.3 kg     Palliative Assessment/Data:     Time In: 1300 Time Out: 1400 Time Total: 60 min Greater than 50%  of this time was spent counseling and coordinating care related to the above assessment and plan.  Signed by: Vinie Sill, NP Palliative Medicine Team Pager # [phone] (M-F 8a-5p) Team Phone # [phone] (Nights/Weekends)

## 2020-03-11 NOTE — Progress Notes (Signed)
BP 205/102; patient agitated and restless, swinging arms. MEWS 3. Provider notified.

## 2020-03-11 NOTE — Progress Notes (Signed)
*  PRELIMINARY RESULTS* Echocardiogram 2D Echocardiogram has been performed.  Leavy Cella 03/11/2020, 11:37 AM

## 2020-03-11 NOTE — Progress Notes (Signed)
Patient's clothing sent home with son, [name]. No belongings left at bedside.

## 2020-03-11 NOTE — Progress Notes (Signed)
Patient arrived to unit via bed.

## 2020-03-12 ENCOUNTER — Inpatient Hospital Stay (HOSPITAL_COMMUNITY): Payer: Medicare Other

## 2020-03-12 LAB — URINE CULTURE: Culture: >=100000 — AB

## 2020-03-12 LAB — GLUCOSE, CAPILLARY
Glucose-Capillary: 101 mg/dL — ABNORMAL HIGH (ref 70–99)
Glucose-Capillary: 111 mg/dL — ABNORMAL HIGH (ref 70–99)
Glucose-Capillary: 87 mg/dL (ref 70–99)
Glucose-Capillary: 89 mg/dL (ref 70–99)
Glucose-Capillary: 95 mg/dL (ref 70–99)
Glucose-Capillary: 97 mg/dL (ref 70–99)

## 2020-03-12 LAB — BASIC METABOLIC PANEL
Anion gap: 14 (ref 5–15)
BUN: 17 mg/dL (ref 8–23)
CO2: 30 mmol/L (ref 22–32)
Calcium: 9.2 mg/dL (ref 8.9–10.3)
Chloride: 103 mmol/L (ref 98–111)
Creatinine, Ser: 1.7 mg/dL — ABNORMAL HIGH (ref 0.44–1.00)
GFR calc Af Amer: 32 mL/min — ABNORMAL LOW (ref >60)
GFR calc non Af Amer: 28 mL/min — ABNORMAL LOW (ref >60)
Glucose, Bld: 95 mg/dL (ref 70–99)
Potassium: 3.2 mmol/L — ABNORMAL LOW (ref 3.5–5.1)
Sodium: 147 mmol/L — ABNORMAL HIGH (ref 135–145)

## 2020-03-12 LAB — CBC
HCT: 34.7 % — ABNORMAL LOW (ref 36.0–46.0)
Hemoglobin: 10.2 g/dL — ABNORMAL LOW (ref 12.0–15.0)
MCH: 26.9 pg (ref 26.0–34.0)
MCHC: 29.4 g/dL — ABNORMAL LOW (ref 30.0–36.0)
MCV: 91.6 fL (ref 80.0–100.0)
Platelets: 209 10*3/uL (ref 150–400)
RBC: 3.79 MIL/uL — ABNORMAL LOW (ref 3.87–5.11)
RDW: 15 % (ref 11.5–15.5)
WBC: 4.7 10*3/uL (ref 4.0–10.5)
nRBC: 0 % (ref 0.0–0.2)

## 2020-03-12 LAB — MAGNESIUM: Magnesium: 1.9 mg/dL (ref 1.7–2.4)

## 2020-03-12 MED ORDER — DEXTROSE 5 % IV SOLN: INTRAVENOUS | Status: AC

## 2020-03-12 MED ORDER — POTASSIUM CHLORIDE 10 MEQ/100ML IV SOLN
10.0000 meq | INTRAVENOUS | Status: AC
  Administered 2020-03-12 (×3): 10 meq via INTRAVENOUS
  Filled 2020-03-12 (×3): qty 100

## 2020-03-12 NOTE — Progress Notes (Signed)
OT Cancellation Note  Patient Details Name: [name] MRN: [id number] DOB: Feb 01, 1936   Cancelled Treatment:    Reason Eval/Treat Not Completed: Patient not medically ready. Chart reviewed, spoke with RN. Pt has been agitated and restless, was given Haldol and Ativan to address. Pt is not appropriate for OT services at this time. Will continue to follow and will evaluation if/when appropriate.   Guadelupe Sabin, OTR/L  [phone] 03/12/2020, 8:51 AM

## 2020-03-12 NOTE — Progress Notes (Signed)
PROGRESS NOTE [facility]   [name]  [id number]  DOB: 1936-11-26  DOA: 03/10/2020 PCP: Jani Gravel, MD   Brief Admission Hx: 84 year old female with chronic atrial fibrillation, diabetes mellitus type 2, hypertension, biventricular heart failure, cerebrovascular disease and coronary artery disease presents with sudden onset of left-sided weakness.  After speaking with her son who is her primary caretaker he tells me that there has been a problem with her medications as she had missed some doses of medications when she was with other family members but he notes that he usually makes sure that she has not missed any doses.  She presents markedly volume overloaded with pulmonary edema and I also worry that she missed some of her Eliquis doses which may have precipitated the CVA.   MDM/Assessment & Plan:   1. Acute embolic CVA-repeated MRI with light sedation confirms acute infarction.   Unfortunately patient continues to have significant dysphagia. I spoke with daughter today and made her aware as well as son.   She will have to be n.p.o. for now as she is not safe for swallowing according to SLP. Continue stroke workup.   PT/OT eval.  Pt likely will need SNF placement however after speaking with son, he may not be agreeable, will address daily with family.  Pt appears to be declining rapidly and I have asked for a palliative medicine consult for goals of care.  Neurology has seen and recommended ASA 325 mg and consider switching to xarelto.  I expressed to family that patient is not doing well and likely would not tolerate a feeding tube as she is very agitated and confused and does not tolerate much interaction.  I think that comfort care is appropriate.  Pt has large family and remain undecided at this time.   2. Chronic atrial fibrillation - rate currently controlled, she had been anticoagulated with apixaban but neuro recommending consider switching to  xarelto. 3. Hypertension-blood pressures elevated, will allow for permissive hypertension in the setting of the acute CVA and over the course of several days plan to lower blood pressure. 4. History of remote seizure activity-she had been started on Keppra by neurology earlier this year as empiric treatment for suspected unwitnessed seizures.  Keppra IV ordered for now.  5. HFrEF - Pt is volume overloaded and I suspect has not been receiving lasix as prescribed.  She has pulmonary edema on chest xray.  She is being diuresed with IV lasix.  Continue to monitor I/O closely and monitor renal function.  6. CKD stage 3 - Monitoring creatinine closely in setting of IV diuresis.  7. Type 2 DM - had been controlled on Actos, Januvia,- Would rethink starting her back on actos with her known biventricular CHF, hold januvia while in hospital. Monitor CBGs.  SSI as needed.  8. Chronic respiratory failure - continue supplemental oxygen.  9. Gout - resume allopurinol when able.  10. Dysphagia - NPO for now.  SLP following.  11. Hypernatremia - ordered some free water in IV today.  Recheck in AM.    DVT prophylaxis: heparin  Code Status: Full  Family Communication: son [name] and daughter margaret updated by telephone, there are complex family dynamics involved Disposition Plan: continue inpatient stroke work up, subspecialty consultation (neurology), palliative consult, decisions about nutrition, not ready for discharge, ongoing discussions about PEG tube versus comfort care  Consultants:  Neurology  Palliative   Procedures:    Antimicrobials:     Subjective: Pt remains somnolent, arousable,  agitated  Objective: Vitals:   03/12/20 0000 03/12/20 0500 03/12/20 0800 03/12/20 1236  BP: (!) 165/82   (!) 180/92  Pulse: 64   78  Resp: 19  18 20   Temp: 98.8 F (37.1 C)     TempSrc: Oral     SpO2: 96%   97%  Weight:  79.3 kg    Height:        Intake/Output Summary (Last 24 hours) at 03/12/2020  1350 Last data filed at 03/12/2020 0600 Gross per 24 hour  Intake 100 ml  Output 1500 ml  Net -1400 ml   Filed Weights   03/10/20 1317 03/10/20 1853 03/12/20 0500  Weight: 90.7 kg 86.3 kg 79.3 kg   REVIEW OF SYSTEMS  UTO due to current condition   Exam:  General exam: elderly frail female, awake, somnolent, NAD.  Irritable and resistant to exam. Drooling from mouth.  Tongue protruded out to left.  Respiratory system: Clear. No increased work of breathing. Cardiovascular system: irregularly irregular, S1 & S2 heard. Mild JVD, 1+ pedal edema. Gastrointestinal system: Abdomen is nondistended, soft and nontender. Normal bowel sounds heard. Central nervous system: somnolent, left facial droop, pronounced left hemiparesis. Extremities: 1+ edema BLEs.   Data Reviewed: Basic Metabolic Panel: Recent Labs  Lab 03/10/20 1312 03/11/20 0404 03/12/20 0532  NA 144  144 146* 147*  K 3.5  3.5 3.7 3.2*  CL 109  108 109 103  CO2 26 26 30   GLUCOSE 153*  151* 104* 95  BUN 20  19 19 17   CREATININE 1.71*  1.80* 1.67* 1.70*  CALCIUM 8.9 8.9 9.2  MG 2.1  --  1.9   Liver Function Tests: Recent Labs  Lab 03/10/20 1312 03/11/20 0404  AST 13* 11*  ALT 12 11  ALKPHOS 149* 126  BILITOT 0.3 0.7  PROT 6.8 6.4*  ALBUMIN 3.1* 3.0*   No results for input(s): LIPASE, AMYLASE in the last 168 hours. No results for input(s): AMMONIA in the last 168 hours. CBC: Recent Labs  Lab 03/10/20 1312 03/11/20 0404 03/12/20 0532  WBC 5.3 5.8 4.7  NEUTROABS 3.4  --   --   HGB 10.9*  10.1* 9.4* 10.2*  HCT 32.0*  33.9* 32.5* 34.7*  MCV 89.0 90.8 91.6  PLT 242 247 209   Cardiac Enzymes: No results for input(s): CKTOTAL, CKMB, CKMBINDEX, TROPONINI in the last 168 hours. CBG (last 3)  Recent Labs    03/12/20 0345 03/12/20 0804 03/12/20 1237  GLUCAP 101* 87 89   Recent Results (from the past 240 hour(s))  Urine culture     Status: Abnormal   Collection Time: 03/10/20  3:38 PM   Specimen:  Urine, Random  Result Value Ref Range Status   Specimen Description   Final    URINE, RANDOM Performed at [facility], [address] [place], [place] [address]    Special Requests   Final    NONE Performed at [facility], [address], [place], [place] [address]    Culture >=100,000 COLONIES/mL Su Hoff (A)  Final   Report Status 03/12/2020 FINAL  Final   Organism ID, Bacteria CITROBACTER YOUNGAE (A)  Final      Susceptibility   Citrobacter youngae - MIC*    CEFAZOLIN >=64 RESISTANT Resistant     CEFTRIAXONE >=64 RESISTANT Resistant     CIPROFLOXACIN <=0.25 SENSITIVE Sensitive     GENTAMICIN <=1 SENSITIVE Sensitive     IMIPENEM 1 SENSITIVE Sensitive     NITROFURANTOIN <=16  SENSITIVE Sensitive     TRIMETH/SULFA <=20 SENSITIVE Sensitive     PIP/TAZO 64 INTERMEDIATE Intermediate     * >=100,000 COLONIES/mL CITROBACTER YOUNGAE  Respiratory Panel by RT PCR (Flu A&B, Covid) - Nasopharyngeal Swab     Status: None   Collection Time: 03/10/20  3:39 PM   Specimen: Nasopharyngeal Swab  Result Value Ref Range Status   SARS Coronavirus 2 by RT PCR NEGATIVE NEGATIVE Final    Comment: (NOTE) SARS-CoV-2 target nucleic acids are NOT DETECTED. The SARS-CoV-2 RNA is generally detectable in upper respiratoy specimens during the acute phase of infection. The lowest concentration of SARS-CoV-2 viral copies this assay can detect is 131 copies/mL. A negative result does not preclude SARS-Cov-2 infection and should not be used as the sole basis for treatment or other patient management decisions. A negative result may occur with  improper specimen collection/handling, submission of specimen other than nasopharyngeal swab, presence of viral mutation(s) within the areas targeted by this assay, and inadequate number of viral copies (<131 copies/mL). A negative result must be combined with clinical observations, patient history, and epidemiological information. The expected result is  Negative. Fact Sheet for Patients:  PinkCheek.be Fact Sheet for Healthcare Providers:  GravelBags.it This test is not yet ap proved or cleared by the [place] FDA and  has been authorized for detection and/or diagnosis of SARS-CoV-2 by FDA under an Emergency Use Authorization (EUA). This EUA will remain  in effect (meaning this test can be used) for the duration of the COVID-19 declaration under Section 564(b)(1) of the Act, 21 U.S.C. section 360bbb-3(b)(1), unless the authorization is terminated or revoked sooner.    Influenza A by PCR NEGATIVE NEGATIVE Final   Influenza B by PCR NEGATIVE NEGATIVE Final    Comment: (NOTE) The Xpert Xpress SARS-CoV-2/FLU/RSV assay is intended as an aid in  the diagnosis of influenza from Nasopharyngeal swab specimens and  should not be used as a sole basis for treatment. Nasal washings and  aspirates are unacceptable for Xpert Xpress SARS-CoV-2/FLU/RSV  testing. Fact Sheet for Patients: PinkCheek.be Fact Sheet for Healthcare Providers: GravelBags.it This test is not yet approved or cleared by the [place] FDA and  has been authorized for detection and/or diagnosis of SARS-CoV-2 by  FDA under an Emergency Use Authorization (EUA). This EUA will remain  in effect (meaning this test can be used) for the duration of the  Covid-19 declaration under Section 564(b)(1) of the Act, 21  U.S.C. section 360bbb-3(b)(1), unless the authorization is  terminated or revoked. Performed at [facility], [address] [place], [place] [address]   SARS CORONAVIRUS 2 (TAT 6-24 HRS) Nasopharyngeal Nasopharyngeal Swab     Status: None   Collection Time: 03/10/20  4:19 PM   Specimen: Nasopharyngeal Swab  Result Value Ref Range Status   SARS Coronavirus 2 NEGATIVE NEGATIVE Final    Comment: (NOTE) SARS-CoV-2 target nucleic acids are NOT  DETECTED. The SARS-CoV-2 RNA is generally detectable in upper and lower respiratory specimens during the acute phase of infection. Negative results do not preclude SARS-CoV-2 infection, do not rule out co-infections with other pathogens, and should not be used as the sole basis for treatment or other patient management decisions. Negative results must be combined with clinical observations, patient history, and epidemiological information. The expected result is Negative. Fact Sheet for Patients: SugarRoll.be Fact Sheet for Healthcare Providers: https://www.woods-mathews.com/ This test is not yet approved or cleared by the [place] FDA and  has been authorized for detection  and/or diagnosis of SARS-CoV-2 by FDA under an Emergency Use Authorization (EUA). This EUA will remain  in effect (meaning this test can be used) for the duration of the COVID-19 declaration under Section 56 4(b)(1) of the Act, 21 U.S.C. section 360bbb-3(b)(1), unless the authorization is terminated or revoked sooner. Performed at [facility], [place] [address], [place], [place] [address]   MRSA PCR Screening     Status: Abnormal   Collection Time: 03/10/20  6:18 PM   Specimen: Nasal Mucosa; Nasopharyngeal  Result Value Ref Range Status   MRSA by PCR POSITIVE (A) NEGATIVE Final    Comment:        The GeneXpert MRSA Assay (FDA approved for NASAL specimens only), is one component of a comprehensive MRSA colonization surveillance program. It is not intended to diagnose MRSA infection nor to guide or monitor treatment for MRSA infections. RESULT CALLED TO, READ BACK BY AND VERIFIED WITH: Kirtland Bouchard @0142  03/11/20 [facility] Performed at [facility], [address] [place], [place] [address]      Studies: MR ANGIO HEAD WO CONTRAST  Result Date: 03/11/2020 CLINICAL DATA:  Focal neuro deficit.  Left-sided weakness. EXAM: MRI HEAD WITHOUT CONTRAST MRA HEAD  WITHOUT CONTRAST TECHNIQUE: Multiplanar, multiecho pulse sequences of the brain and surrounding structures were obtained without intravenous contrast. Angiographic images of the head were obtained using MRA technique without contrast. COMPARISON:  MRI head 03/10/2020 FINDINGS: MRI HEAD FINDINGS Image quality degraded by significant motion. Diffusion-weighted imaging demonstrates restricted diffusion in the deep white matter on the right compatible with acute infarct. Ventricle size normal.  Generalized atrophy. MRA HEAD FINDINGS Nondiagnostic MRA due to extensive motion. IMPRESSION: Image quality degraded by extensive motion. Restricted diffusion in the deep white matter on the right compatible with acute infarction. MRA head nondiagnostic due to motion. Electronically Signed   By: Franchot Gallo M.D.   On: 03/11/2020 11:05   MR BRAIN WO CONTRAST  Result Date: 03/11/2020 CLINICAL DATA:  Focal neuro deficit.  Left-sided weakness. EXAM: MRI HEAD WITHOUT CONTRAST MRA HEAD WITHOUT CONTRAST TECHNIQUE: Multiplanar, multiecho pulse sequences of the brain and surrounding structures were obtained without intravenous contrast. Angiographic images of the head were obtained using MRA technique without contrast. COMPARISON:  MRI head 03/10/2020 FINDINGS: MRI HEAD FINDINGS Image quality degraded by significant motion. Diffusion-weighted imaging demonstrates restricted diffusion in the deep white matter on the right compatible with acute infarct. Ventricle size normal.  Generalized atrophy. MRA HEAD FINDINGS Nondiagnostic MRA due to extensive motion. IMPRESSION: Image quality degraded by extensive motion. Restricted diffusion in the deep white matter on the right compatible with acute infarction. MRA head nondiagnostic due to motion. Electronically Signed   By: Franchot Gallo M.D.   On: 03/11/2020 11:05   MR BRAIN WO CONTRAST  Result Date: 03/10/2020 CLINICAL DATA:  Encephalopathy EXAM: MRI HEAD WITHOUT CONTRAST TECHNIQUE:  Multiplanar, multiecho pulse sequences of the brain and surrounding structures were obtained without intravenous contrast. COMPARISON:  None. FINDINGS: Attempted sequences are highly motion degraded. Diffusion sequence is nondiagnostic. IMPRESSION: Nondiagnostic study. Recommend repeating when possible with sedation. Electronically Signed   By: Macy Mis M.D.   On: 03/10/2020 15:18   DG CHEST PORT 1 VIEW  Result Date: 03/12/2020 CLINICAL DATA:  Pulmonary edema. EXAM: PORTABLE CHEST 1 VIEW COMPARISON:  03/11/2020. FINDINGS: Cardiomegaly with pulmonary venous congestion again noted. Bilateral interstitial prominence consistent interstitial edema again noted. Pneumonitis cannot be excluded. Small left pleural effusion again noted. No pneumothorax degenerative change thoracic spine.  IMPRESSION: Cardiomegaly with pulmonary venous congestion and bilateral interstitial prominence consistent with interstitial edema again noted. Small left pleural effusion again noted. Similar findings noted on prior study of 03/11/2020. Electronically Signed   By: Marcello Moores  Register   On: 03/12/2020 06:00   DG CHEST PORT 1 VIEW  Result Date: 03/11/2020 CLINICAL DATA:  Patient admitted 03/10/2020 with left side weakness and metabolic encephalopathy. Congestive heart failure. EXAM: PORTABLE CHEST 1 VIEW COMPARISON:  CT chest 12/26/2019. Single-view of the chest 03/10/2020, 12/28/2019 and 12/26/2019. FINDINGS: Opacity at the left lung base has improved consistent with decreased atelectasis or effusion. There is cardiomegaly and interstitial edema. Small right pleural effusion noted. No pneumothorax. Atherosclerosis. IMPRESSION: Improved aeration left lung base. Cardiomegaly and interstitial edema with small bilateral pleural effusions. Electronically Signed   By: Inge Rise M.D.   On: 03/11/2020 09:58   DG Chest Port 1 View  Result Date: 03/10/2020 CLINICAL DATA:  Tachypnea EXAM: PORTABLE CHEST 1 VIEW COMPARISON:  December 28, 2019 FINDINGS: The right-sided PICC line has been removed. The heart size is enlarged. The pulmonary vasculature is dilated. Hazy bilateral airspace opacities are noted with prominent interstitial lung markings and Kerley B lines. There is consolidation versus atelectasis at the left lung base. Small bilateral pleural effusions are noted. Aortic calcifications are noted. IMPRESSION: Cardiomegaly with pulmonary vascular congestion and pulmonary edema. Small bilateral pleural effusions. Consolidation versus atelectasis at the left lung base. Dilated pulmonary arteries which can be seen in patients with elevated pulmonary artery pressures. Electronically Signed   By: Constance Holster M.D.   On: 03/10/2020 14:56   ECHOCARDIOGRAM COMPLETE  Result Date: 03/11/2020    ECHOCARDIOGRAM REPORT   Patient Name:   [name] Date of Exam: 03/11/2020 Medical Rec #:  [id number]       Height:       65.0 in Accession #:    [id number]      Weight:       190.3 lb Date of Birth:  Jan 02, 1936      BSA:          1.937 m Patient Age:    73 years        BP:           184/76 mmHg Patient Gender: F               HR:           78 bpm. Exam Location:  [facility] Procedure: 2D Echo Indications:    Stroke 434.91 / I163.9  History:        Patient has prior history of Echocardiogram examinations, most                 recent 04/25/2018. CHF, Stroke, Arrythmias:Atrial Fibrillation,                 Signs/Symptoms:Dyspnea; Risk Factors:Diabetes and Non-Smoker.                 Mitral Regurgitation.  Sonographer:    Leavy Cella RDCS (AE) Referring Phys: [place]  1. Left ventricular ejection fraction, by estimation, is 40 to 45%. The left ventricle has mildly decreased function. The left ventricle demonstrates global hypokinesis. There is moderate concentric left ventricular hypertrophy. Left ventricular diastolic parameters are consistent with Grade II diastolic dysfunction (pseudonormalization). Elevated  left ventricular end-diastolic pressure.  2. Right ventricular systolic function is normal. The right ventricular size is normal. There is moderately elevated pulmonary artery systolic  pressure.  3. The mitral valve is degenerative. Mild to moderate mitral valve regurgitation.  4. The aortic valve is tricuspid. Aortic valve regurgitation is not visualized. No aortic stenosis is present.  5. The inferior vena cava is normal in size with greater than 50% respiratory variability, suggesting right atrial pressure of 3 mmHg. FINDINGS  Left Ventricle: Left ventricular ejection fraction, by estimation, is 40 to 45%. The left ventricle has mildly decreased function. The left ventricle demonstrates global hypokinesis. The left ventricular internal cavity size was normal in size. There is  moderate concentric left ventricular hypertrophy. Left ventricular diastolic parameters are consistent with Grade II diastolic dysfunction (pseudonormalization). Elevated left ventricular end-diastolic pressure. Right Ventricle: The right ventricular size is normal. No increase in right ventricular wall thickness. Right ventricular systolic function is normal. There is moderately elevated pulmonary artery systolic pressure. The tricuspid regurgitant velocity is 3.29 m/s, and with an assumed right atrial pressure of 10 mmHg, the estimated right ventricular systolic pressure is [id number] mmHg. Left Atrium: Left atrial size was normal in size. Right Atrium: Right atrial size was normal in size. Pericardium: There is no evidence of pericardial effusion. Mitral Valve: The mitral valve is degenerative in appearance. There is mild thickening of the mitral valve leaflet(s). Mild mitral annular calcification. Mild to moderate mitral valve regurgitation. Tricuspid Valve: The tricuspid valve is grossly normal. Tricuspid valve regurgitation is mild. Aortic Valve: The aortic valve is tricuspid. Aortic valve regurgitation is not visualized. No aortic stenosis  is present. Pulmonic Valve: The pulmonic valve was grossly normal. Pulmonic valve regurgitation is not visualized. Aorta: The aortic root is normal in size and structure. Venous: The inferior vena cava is normal in size with greater than 50% respiratory variability, suggesting right atrial pressure of 3 mmHg. IAS/Shunts: No atrial level shunt detected by color flow Doppler.  LEFT VENTRICLE PLAX 2D LVIDd:         4.55 cm  Diastology LVIDs:         3.66 cm  LV e' lateral:   5.48 cm/s LV PW:         1.62 cm  LV E/e' lateral: 17.2 LV IVS:        1.43 cm  LV e' medial:    4.35 cm/s LVOT diam:     2.00 cm  LV E/e' medial:  21.7 LVOT Area:     3.14 cm  RIGHT VENTRICLE TAPSE (M-mode): 2.5 cm LEFT ATRIUM           Index LA diam:      3.40 cm 1.76 cm/m LA Vol (A2C): 66.3 ml 34.24 ml/m LA Vol (A4C): 58.6 ml 30.26 ml/m   AORTA Ao Root diam: 2.90 cm MITRAL VALVE               TRICUSPID VALVE MV Area (PHT): 4.39 cm    TR Peak grad:   43.3 mmHg MV Decel Time: 173 msec    TR Vmax:        329.00 cm/s MR Peak grad: 108.6 mmHg MR Mean grad: 71.0 mmHg    SHUNTS MR Vmax:      521.00 cm/s  Systemic Diam: 2.00 cm MR Vmean:     400.0 cm/s MV E velocity: 94.30 cm/s MV A velocity: 60.00 cm/s MV E/A ratio:  1.57 Kate Sable MD Electronically signed by Kate Sable MD Signature Date/Time: 03/11/2020/11:47:23 AM    Final    Scheduled Meds: . aspirin  300 mg Rectal Daily   Or  .  aspirin  325 mg Oral Daily  . Chlorhexidine Gluconate Cloth  6 each Topical Q0600  . furosemide  40 mg Intravenous Q12H  . heparin  5,000 Units Subcutaneous Q8H  . insulin aspart  0-9 Units Subcutaneous Q4H  . mupirocin ointment  1 application Nasal BID   Continuous Infusions: . levETIRAcetam 500 mg (03/12/20 0241)    Principal Problem:   Acute metabolic encephalopathy Active Problems:   Type 2 diabetes mellitus with hyperlipidemia (HCC)   Essential hypertension   CVA (cerebral vascular accident) ([place])   Acute on chronic combined  systolic and diastolic CHF (congestive heart failure) (HCC)   Right bundle branch block   Atrial fibrillation with RVR (HCC)   Atrial fibrillation, chronic (HCC)   Left-sided weakness   Goals of care, counseling/discussion   Palliative care by specialist  Time spent:   Irwin Brakeman, MD [facility] 03/12/2020, 1:50 PM    LOS: 2 days  How to contact the [facility] Attending or Consulting provider [place] or covering provider during after hours [place], for this patient?  1. Check the care team in [facility] and look for a) attending/consulting [facility] provider listed and b) the [facility] team listed 2. Log into [email or web address] and use [place]'s universal password to access. If you do not have the password, please contact the hospital operator. 3. Locate the [facility] provider you are looking for under [facility] and page to a number that you can be directly reached. 4. If you still have difficulty reaching the provider, please page the [facility] (Director on Call) for the Hospitalists listed on amion for assistance.

## 2020-03-12 NOTE — Progress Notes (Signed)
Daughter Lelon Frohlich at bedside. Updated on pt condition and current treatment, esp that pt remains NPO due to inability to swallow as confirmed by speech therapist eval this afternoon. Daughter stated understanding. Advised daughter of oral care and oral suction, daughter instructed on use of suction swab and able to use appropriately.  Pt awake, answers most questions, speech remains slurred. Pt denies any complaint at this time.

## 2020-03-12 NOTE — Evaluation (Signed)
Physical Therapy Evaluation Patient Details Name: [name] MRN: [id number] DOB: [birth year] Today's Date: 03/12/2020   History of Present Illness  [name] is a 84 y.o. female with medical history significant for atrial fibrillation, diabetes mellitus, hypertension, coronary artery disease, systolic and diastolic CHF, CVA.History is obtained from chart review and patient's daughter, at the time of my evaluation patient is somnolent and unable to give me history.Patient was brought to the ED via EMS.  Per triage note EMS was called out for respiratory problems.  EMS found patient to be drooling with left-sided upper and lower extremity weakness, left facial droop.Patient woke up this morning at about 8:30 AM, she was normal took her medications to eat her breakfast, she was able to ambulate, start on so far at about 9 AM.  Family noticed that patient started slurring her words at about 10:00.  Was also unable to stand to go to the bathroom.  Patient is on Eliquis and last took her medications this morning.    Clinical Impression  Patient presents with left sided weakness, unable to grip with left hand, frequent falling backwards while seated at bedside, limited to a few side steps with mostly dragging of LLE due to weakness, severe fall risk and unsafe to attempt taking steps away from bedside.  Had to hold patients hand onto RW when standing/taking steps and put back to bed after therapy due to frequent pulling at IV insertion.  Patient will benefit from continued physical therapy in hospital and recommended venue below to increase strength, balance, endurance for safe ADLs and gait.    Follow Up Recommendations SNF    Equipment Recommendations  None recommended by PT    Recommendations for Other Services       Precautions / Restrictions Precautions Precautions: Fall Restrictions Weight Bearing Restrictions: No      Mobility  Bed Mobility Overal bed mobility: Needs  Assistance Bed Mobility: Supine to Sit;Sit to Supine     Supine to sit: Mod assist;Max assist Sit to supine: Max assist   General bed mobility comments: increased time, labored movement, has diffiuclty following instructions  Transfers Overall transfer level: Needs assistance Equipment used: Rolling walker (2 wheeled) Transfers: Sit to/from Stand Sit to Stand: Mod assist;Max assist         General transfer comment: unable to fully grip RW with left due to weakness, very unsteady on LLE  Ambulation/Gait Ambulation/Gait assistance: Max assist Gait Distance (Feet): 3 Feet Assistive device: Rolling walker (2 wheeled) Gait Pattern/deviations: Decreased step length - right;Decreased step length - left;Decreased stance time - left;Decreased stride length Gait velocity: decreased   General Gait Details: limited to 3 very unsteady labored side steps with mostly dragging of left foot due to weakness  Stairs            Wheelchair Mobility    Modified Rankin (Stroke Patients Only)       Balance Overall balance assessment: Needs assistance Sitting-balance support: Feet supported;No upper extremity supported Sitting balance-Leahy Scale: Poor Sitting balance - Comments: seated at EOB, frequent falling backwards Postural control: Posterior lean Standing balance support: During functional activity;Bilateral upper extremity supported Standing balance-Leahy Scale: Poor Standing balance comment: using RW                             Pertinent Vitals/Pain Pain Assessment: No/denies pain    Home Living Family/patient expects to be discharged to:: Private residence   Available  Help at Discharge: Family;Available 24 hours/day Type of Home: Mobile home Home Access: Stairs to enter Entrance Stairs-Rails: None Entrance Stairs-Number of Steps: 2 Home Layout: One level Home Equipment: Walker - 2 wheels;Cane - single point;Bedside commode;Shower seat Additional  Comments: Patient unable to answer questions, information taken from previous admission    Prior Function Level of Independence: Needs assistance   Gait / Transfers Assistance Needed: household ambulator without AD, States she has RW and SPC if needed  ADL's / Homemaking Assistance Needed: family assists and is available 24 hours  Comments: info per previous admission     Hand Dominance   Dominant Hand: Right    Extremity/Trunk Assessment   Upper Extremity Assessment Upper Extremity Assessment: Defer to OT evaluation    Lower Extremity Assessment Lower Extremity Assessment: Generalized weakness;RLE deficits/detail;LLE deficits/detail RLE Deficits / Details: grossly 4+/5 RLE Sensation: WNL RLE Coordination: WNL LLE Deficits / Details: grossly -3/5 LLE Sensation: (difficult to assess due to patient's cognition)    Cervical / Trunk Assessment Cervical / Trunk Assessment: Normal  Communication   Communication: Expressive difficulties  Cognition Arousal/Alertness: Awake/alert Behavior During Therapy: Restless;Agitated Overall Cognitive Status: No family/caregiver present to determine baseline cognitive functioning                                 General Comments: very restless with frequent attempts to pull out IV      General Comments      Exercises     Assessment/Plan    PT Assessment Patient needs continued PT services  PT Problem List Decreased strength;Decreased activity tolerance;Decreased balance;Decreased mobility;Decreased cognition;Decreased safety awareness;Decreased coordination       PT Treatment Interventions Gait training;Stair training;Functional mobility training;Therapeutic activities;Therapeutic exercise;Patient/family education    PT Goals (Current goals can be found in the Care Plan section)  Acute Rehab PT Goals Patient Stated Goal: not stated PT Goal Formulation: With patient Time For Goal Achievement: 03/26/20 Potential to  Achieve Goals: Fair    Frequency Min 3X/week   Barriers to discharge        Co-evaluation               AM-PAC PT "6 Clicks" Mobility  Outcome Measure Help needed turning from your back to your side while in a flat bed without using bedrails?: A Lot Help needed moving from lying on your back to sitting on the side of a flat bed without using bedrails?: A Lot Help needed moving to and from a bed to a chair (including a wheelchair)?: A Lot Help needed standing up from a chair using your arms (e.g., wheelchair or bedside chair)?: A Lot Help needed to walk in hospital room?: Total Help needed climbing 3-5 steps with a railing? : Total 6 Click Score: 10    End of Session   Activity Tolerance: Patient tolerated treatment well;Patient limited by fatigue;Treatment limited secondary to agitation Patient left: in bed;with call bell/phone within reach;with bed alarm set Nurse Communication: Mobility status PT Visit Diagnosis: Unsteadiness on feet (R26.81);Other abnormalities of gait and mobility (R26.89);Muscle weakness (generalized) (M62.81)    Time: [id number] PT Time Calculation (min) (ACUTE ONLY): 26 min   Charges:   PT Evaluation $PT Eval Moderate Complexity: 1 Mod PT Treatments $Therapeutic Activity: 23-37 mins        11:33 AM, 03/12/20 Lonell Grandchild, MPT Physical Therapist with [facility] [phone] office [phone] mobile phone

## 2020-03-12 NOTE — Progress Notes (Signed)
  Speech Language Pathology Treatment: Dysphagia  Patient Details Name: [name] MRN: [id number] DOB: 1936/12/01 Today's Date: 03/12/2020 Time: [id number] SLP Time Calculation (min) (ACUTE ONLY): 15 min  Assessment / Plan / Recommendation Clinical Impression  Skilled treatment with presentation of ice chips (oral care completed prior to session via nursing) with slow mastication/propulsion and significantly delayed swallow initiation with delayed weak, wet cough noted.  Pt's speech is more purposeful, but moderately dysarthric when asked simple personal questions.  Nursing stated yesterday she was unintelligible when asked questions and combative, so behaviorally, she has improved.  Continue NPO status as pt exhibited labored breathing, salivary pooling and delayed swallow with severe risk for aspiration.  ST will continue efforts in acute setting.  HPI HPI: 84 y.o. female  with past medical history of stroke, dementia, CAD, combined systolic/diastolic CHF (EF 99991111), 2L oxygen at home, atrial fibrillation on Eliquis, diabetes, HTN, CKD stage 3b. Hospitalization 12/26/19-01/01/20 with UTI and aspiration pneumonia and concern for possible seizure. Admitted on 03/10/2020 with slurred speech and left facial droop with worsening weakness left side worse than right. CT head with no evidence of stroke. Treating for UTI and probable pneumonia as well as pulmonary edema. Head CT negative for acute abnormality, shows stable chronic encephalomalacia involving the right frontal white matter, medial left occipital lobe and posterior left cerebellum, moderate generalized atrophy, stable remote lacunar basal ganglia infarcts. MRI motion degraded but shows: Restricted diffusion in the deep white matter on the right compatible with acute infarction. Portable chest x-ray showed cardiomegaly with pulmonary vascular congestion and pulmonary edema, small bilateral pleural effusions, consolidation versus atelectasis at the  left lung base. Pt was seen by SLP during January admission for BSE (advanced to D3/thin by discharge). BSE completed; pt is NPO      SLP Plan  Continue with current plan of care       Recommendations  Diet recommendations: NPO Medication Administration: Via alternative means                Oral Care Recommendations: Oral care QID Follow up Recommendations: 24 hour supervision/assistance;Skilled Nursing facility SLP Visit Diagnosis: Dysphagia, oropharyngeal phase (R13.12) Plan: Continue with current plan of care                       Elvina Sidle, M.S., CCC-SLP 03/12/2020, 3:05 PM

## 2020-03-12 NOTE — Plan of Care (Signed)
  Problem: Acute Rehab PT Goals(only PT should resolve) Goal: Pt Will Go Supine/Side To Sit Outcome: Progressing Flowsheets (Taken 03/12/2020 1135) Pt will go Supine/Side to Sit:  with minimal assist  with moderate assist Goal: Patient Will Transfer Sit To/From Stand Outcome: Progressing Flowsheets (Taken 03/12/2020 1135) Patient will transfer sit to/from stand:  with minimal assist  with moderate assist Goal: Pt Will Transfer Bed To Chair/Chair To Bed Outcome: Progressing Flowsheets (Taken 03/12/2020 1135) Pt will Transfer Bed to Chair/Chair to Bed:  with min assist  with mod assist Goal: Pt Will Ambulate Outcome: Progressing Flowsheets (Taken 03/12/2020 1135) Pt will Ambulate:  10 feet  with moderate assist  with rolling walker Note: Hemi-walker, quad-cane   11:36 AM, 03/12/20 Lonell Grandchild, MPT Physical Therapist with [facility] [phone] office [phone] mobile phone

## 2020-03-13 ENCOUNTER — Inpatient Hospital Stay (HOSPITAL_COMMUNITY)
Admit: 2020-03-13 | Discharge: 2020-03-13 | Disposition: A | Discharge status: Home / Self Care | Payer: Medicare Other | Attending: Family Medicine | Admitting: Family Medicine

## 2020-03-13 ENCOUNTER — Inpatient Hospital Stay (HOSPITAL_COMMUNITY): Payer: Medicare Other

## 2020-03-13 DIAGNOSIS — G9341 Metabolic encephalopathy: Secondary | ICD-10-CM

## 2020-03-13 LAB — BASIC METABOLIC PANEL
Anion gap: 15 (ref 5–15)
BUN: 15 mg/dL (ref 8–23)
CO2: 31 mmol/L (ref 22–32)
Calcium: 9.6 mg/dL (ref 8.9–10.3)
Chloride: 97 mmol/L — ABNORMAL LOW (ref 98–111)
Creatinine, Ser: 1.65 mg/dL — ABNORMAL HIGH (ref 0.44–1.00)
GFR calc Af Amer: 33 mL/min — ABNORMAL LOW (ref >60)
GFR calc non Af Amer: 29 mL/min — ABNORMAL LOW (ref >60)
Glucose, Bld: 111 mg/dL — ABNORMAL HIGH (ref 70–99)
Potassium: 3.2 mmol/L — ABNORMAL LOW (ref 3.5–5.1)
Sodium: 143 mmol/L (ref 135–145)

## 2020-03-13 LAB — CBC
HCT: 37.2 % (ref 36.0–46.0)
Hemoglobin: 11.1 g/dL — ABNORMAL LOW (ref 12.0–15.0)
MCH: 26.1 pg (ref 26.0–34.0)
MCHC: 29.8 g/dL — ABNORMAL LOW (ref 30.0–36.0)
MCV: 87.5 fL (ref 80.0–100.0)
Platelets: 255 10*3/uL (ref 150–400)
RBC: 4.25 MIL/uL (ref 3.87–5.11)
RDW: 14.6 % (ref 11.5–15.5)
WBC: 4.9 10*3/uL (ref 4.0–10.5)
nRBC: 0 % (ref 0.0–0.2)

## 2020-03-13 LAB — MAGNESIUM: Magnesium: 1.8 mg/dL (ref 1.7–2.4)

## 2020-03-13 LAB — GLUCOSE, CAPILLARY
Glucose-Capillary: 102 mg/dL — ABNORMAL HIGH (ref 70–99)
Glucose-Capillary: 103 mg/dL — ABNORMAL HIGH (ref 70–99)
Glucose-Capillary: 108 mg/dL — ABNORMAL HIGH (ref 70–99)
Glucose-Capillary: 119 mg/dL — ABNORMAL HIGH (ref 70–99)
Glucose-Capillary: 128 mg/dL — ABNORMAL HIGH (ref 70–99)
Glucose-Capillary: 142 mg/dL — ABNORMAL HIGH (ref 70–99)
Glucose-Capillary: 152 mg/dL — ABNORMAL HIGH (ref 70–99)

## 2020-03-13 MED ORDER — APIXABAN 2.5 MG PO TABS
2.5000 mg | ORAL_TABLET | Freq: Two times a day (BID) | ORAL | Status: DC
  Administered 2020-03-13 – 2020-03-17 (×8): 2.5 mg via ORAL
  Filled 2020-03-13 (×9): qty 1

## 2020-03-13 MED ORDER — POTASSIUM CHLORIDE 10 MEQ/100ML IV SOLN
10.0000 meq | INTRAVENOUS | Status: AC
  Administered 2020-03-13 (×3): 10 meq via INTRAVENOUS
  Filled 2020-03-13: qty 100

## 2020-03-13 MED ORDER — DEXTROSE 5 % IV SOLN: INTRAVENOUS | Status: DC

## 2020-03-13 MED ORDER — METOPROLOL TARTRATE 5 MG/5ML IV SOLN
2.5000 mg | Freq: Four times a day (QID) | INTRAVENOUS | Status: DC
  Administered 2020-03-13 – 2020-03-14 (×5): 2.5 mg via INTRAVENOUS
  Filled 2020-03-13 (×5): qty 5

## 2020-03-13 MED ORDER — MEMANTINE HCL 10 MG PO TABS
5.0000 mg | ORAL_TABLET | Freq: Two times a day (BID) | ORAL | Status: DC
  Administered 2020-03-13 – 2020-03-17 (×8): 5 mg via ORAL
  Filled 2020-03-13 (×9): qty 1

## 2020-03-13 MED ORDER — OSMOLITE 1.2 CAL PO LIQD
1000.0000 mL | ORAL | Status: DC
  Filled 2020-03-13 (×8): qty 1000

## 2020-03-13 MED ORDER — ASPIRIN 81 MG PO CHEW
81.0000 mg | CHEWABLE_TABLET | Freq: Every day | ORAL | Status: DC
  Administered 2020-03-14 – 2020-03-17 (×4): 81 mg via ORAL
  Filled 2020-03-13 (×5): qty 1

## 2020-03-13 NOTE — Progress Notes (Signed)
Initial Nutrition Assessment  DOCUMENTATION CODES:   Not applicable  INTERVENTION:  Once NGT placed, recommend initiating Osmolite 1.2 @ 20 ml/hr, advance as tolerated 10 ml every 6 hrs to goal rate 60 ml/hr (1440 ml/day)  Tube feed regimen at goal rate 60 ml/hr will provide 1728 kcal, 80 grams of protein, and 1181 ml free water   NUTRITION DIAGNOSIS:   Inadequate oral intake related to dysphagia, lethargy/confusion as evidenced by NPO status.   GOAL:   Patient will meet greater than or equal to 90% of their needs    MONITOR:   Weight trends, Labs, I & O's, Diet advancement, TF tolerance  REASON FOR ASSESSMENT:   Consult Enteral/tube feeding initiation and management  ASSESSMENT:  RD working remotely.   84 year old female with past medical history significant for dementia, atrial fibrillation, DM, HTN, CAD, systolic and diastolic CHF, CVA, and recent hospitalization 1/14-1/24 for treatment of pansensitive E coli UTI and pneumonia presented via EMS after being called out for respiratory problems. EMS found pt to be drooling, left upper and lower extremity weakness, and left facial droop. Upon arrival to ED, pt noted somnolent and admitted on 3/30 for acute CVA.  Per chart review, patient remains symptomatic. Per SLP pt with significant dysphagia and is not safe for swallowing, RD consulted for initiation of tube feeds. Patient appears to be declining rapidly, MD has expressed to family that patient likely would not tolerate tube feedings secondary to agitation, confusion, is not tolerating much interaction and appropriate for comfort care. Patient has a large family and remains undecided at this time, PCT consulted, ongoing discussions about PEG tube verses comfort care.  Spoke with RN via phone, she reports no NGT at this time. Orders for NGT placed this morning per discussion with MD via secure chat. Recommendations for tube feedings outlined above once NG tube is  placed.  Per flowsheets, on 3/31 diet advanced to DYS 2 with thin liquids, at 0842 diet downgraded to DYS 1 with nectar thick, at 0841 diet changed to DYS 1 with thin liquids and then at 1307 pt NPO. Patient has been without adequate nutrition since admission and is at risk for refeeding, recommend monitoring K/Mg/P during admission, MD to replete as needed.   Mild pitting LUE and non-pitting BLE edema noted per 4/1 RN assessment.  Current wt 174.46 lbs Per history, on 01/01/20 pt wt 83.4 kg(183.48 lbs), on 11/01/19 pt wt 82.6 kg (181.72 lbs), on 06/27/19 pt wt 84.4 kg (185.68 lbs) This indicates a ~9lb (4.8%) wt loss in the past 3.5 months which is insignificant for time frame, although concerning given advanced age and PMH.  Per notes: -volume overloaded suspected not receiving lasix as prescribed -pulmonary edema on CXR -hypernatremia, free water ordered -alert, cooperative for PT/OT this morning  Medications reviewed and include: SSI IVF: D5 @ 50 ml/hr IVPB: Keppra Labs: CBGs 102,103,108,95, K 3.2 (L), Cr 1.65 (H), Mg 1.8 (WNL)   NUTRITION - FOCUSED PHYSICAL EXAM: Unable to complete at this time, RD working remotely.    Diet Order:   Diet Order            Diet NPO time specified  Diet effective now              EDUCATION NEEDS:   No education needs have been identified at this time  Skin:  Skin Assessment: Reviewed RN Assessment  Last BM:  3/31 type 6  Height:   Ht Readings from Last 1 Encounters:  03/10/20 5\' 5"  (1.651 m)    Weight:   Wt Readings from Last 1 Encounters:  03/12/20 79.3 kg    BMI:  Body mass index is 29.09 kg/m.  Estimated Nutritional Needs:   Kcal:  1525-1750  Protein:  76-90  Fluid:  >/= 1.5 L/day   Lajuan Lines, RD, LDN Clinical Nutrition After Hours/Weekend Pager # in [place]

## 2020-03-13 NOTE — TOC Progression Note (Signed)
Transition of Care [facility]) - Progression Note    Patient Details  Name: [name] MRN: [id number] Date of Birth: 06-10-1936  Transition of Care [facility]) CM/SW [place], LCSW Phone Number: 03/13/2020, 3:18 PM  Clinical Narrative: CSW met with patient and son, [name], to discuss PT recommendations for SNF. Son declined SNF and requested patient be discharged home with [facility] to his home. Son asked CSW to follow up with patient's daughter, [name], to confirm [facility] is what the family wants to do. CSW called daughter and discussed options for [facility]. Daughter requested to discuss the options as a family and would call back CSW. CSW received call back from daughter. Daughter reported the family wants to have the patient discharge with [facility]. CSW reached out to [facility] with [facility] as patient was active with [facility] for [facility] earlier this year. [place] informed CSW [facility] cannot accept patient for [facility] at this time.  Expected Discharge Plan and Services Home w/home health  Readmission Risk Interventions No flowsheet data found.

## 2020-03-13 NOTE — Plan of Care (Signed)
  Problem: Acute Rehab OT Goals (only OT should resolve) Goal: Pt. Will Perform Grooming Flowsheets (Taken 03/13/2020 0811) Pt Will Perform Grooming:  with set-up  sitting Goal: Pt. Will Perform Upper Body Dressing Flowsheets (Taken 03/13/2020 0811) Pt Will Perform Upper Body Dressing:  with min assist  sitting Goal: Pt. Will Transfer To Toilet Flowsheets (Taken 03/13/2020 [phone]) Pt Will Transfer to Toilet:  with mod assist  stand pivot transfer  bedside commode Goal: Pt. Will Perform Toileting-Clothing Manipulation Flowsheets (Taken 03/13/2020 0811) Pt Will Perform Toileting - Clothing Manipulation and hygiene:  with min assist  sitting/lateral leans Goal: Pt/Caregiver Will Perform Home Exercise Program Flowsheets (Taken 03/13/2020 [phone]) Pt/caregiver will Perform Home Exercise Program:  Increased strength  Left upper extremity  With written HEP provided

## 2020-03-13 NOTE — Progress Notes (Signed)
  Speech Language Pathology Treatment: Dysphagia  Patient Details Name: [name] MRN: [id number] DOB: Apr 17, 1936 Today's Date: 03/13/2020 Time: [id number] SLP Time Calculation (min) (ACUTE ONLY): 16 min  Assessment / Plan / Recommendation Clinical Impression  Pt with continued overall improved alertness and awareness today; she is motivated to have PO and has been requesting water. At rest Pt still demonstrates a severe L facial droop and anterior spillage of secretions of which she is unaware. Pt with overt s/sx of aspiration with thin liquids; Pt consumed single ice chips with slow manipulation of bolus yet seemingly good oral control of ice chips however Pt with immediate wet and congested sounding cough before and after the swallow. With single very small cup sips of thin continued immediate and wet coughing. Pt consumed puree trials with prolonged bolus prep and seemingly timely swallow trigger and no overt coughing was noted with these limited trials of puree. Recommend proceed with MBSS today to objectively assess the swallowing function and to determine least restrictive diet; Defer diet recommendation to following MBS.   HPI HPI: 84 y.o. female  with past medical history of stroke, dementia, CAD, combined systolic/diastolic CHF (EF 99991111), 2L oxygen at home, atrial fibrillation on Eliquis, diabetes, HTN, CKD stage 3b. Hospitalization 12/26/19-01/01/20 with UTI and aspiration pneumonia and concern for possible seizure. Admitted on 03/10/2020 with slurred speech and left facial droop with worsening weakness left side worse than right. CT head with no evidence of stroke. Treating for UTI and probable pneumonia as well as pulmonary edema. Head CT negative for acute abnormality, shows stable chronic encephalomalacia involving the right frontal white matter, medial left occipital lobe and posterior left cerebellum, moderate generalized atrophy, stable remote lacunar basal ganglia infarcts. MRI  motion degraded but shows: Restricted diffusion in the deep white matter on the right compatible with acute infarction. Portable chest x-ray showed cardiomegaly with pulmonary vascular congestion and pulmonary edema, small bilateral pleural effusions, consolidation versus atelectasis at the left lung base. Pt was seen by SLP during January admission for BSE (advanced to D3/thin by discharge). BSE requested.       SLP Plan  Continue with current plan of care       Recommendations  Diet recommendations: Other(comment)(defer to [facility] recommendations)                Oral Care Recommendations: Oral care QID Follow up Recommendations: 24 hour supervision/assistance;Skilled Nursing facility SLP Visit Diagnosis: Dysphagia, oropharyngeal phase (R13.12) Plan: Continue with current plan of care       [place] 03/13/2020, 3:56 PM

## 2020-03-13 NOTE — Evaluation (Signed)
Occupational Therapy Evaluation Patient Details Name: [name] MRN: [id number] DOB: 1936/06/12 Today's Date: 03/13/2020    History of Present Illness [name] is a 84 y.o. female with medical history significant for atrial fibrillation, diabetes mellitus, hypertension, coronary artery disease, systolic and diastolic CHF, CVA. History is obtained from chart review and patient's daughter, at the time of my evaluation patient is somnolent and unable to give me history. Patient woke up this morning at about 8:30 AM, she was normal took her medications to eat her breakfast, she was able to ambulate, start on so far at about 9 AM.  Family noticed that patient started slurring her words at about 10:00.  Was also unable to stand to go to the bathroom.  Patient is on Eliquis and last took her medications this morning.   Clinical Impression   Pt awake this am, agreeable to OT evaluation and requesting to sit up in bed. Pt with minimal strength in LUE, OT assisting with repositioning and propping up with pillows. Pt able to use RUE to help adjust position somewhat. Pt is currently requiring max to total assist for ADLs due to severity of deficits secondary to CVA. Recommend SNF on discharge to improve safety and independence in ADL tasks.     Follow Up Recommendations  SNF    Equipment Recommendations  None recommended by OT       Precautions / Restrictions Precautions Precautions: Fall Restrictions Weight Bearing Restrictions: No      Mobility Bed Mobility Overal bed mobility: Needs Assistance Bed Mobility: (repositioning)           General bed mobility comments: Pt requiring max assist to reposition in bed, able to grip railing with RUE to assist  Transfers                 General transfer comment: not completed        ADL either performed or assessed with clinical judgement   ADL Overall ADL's : Needs assistance/impaired                                        General ADL Comments: Pt requiring max to total assist for ADLs due to severity of deficits at this time.      Vision Baseline Vision/History: No visual deficits Additional Comments: Pt with right gaze deviation, is able to track finger with right eye. Unable to open left eye at this time.             Pertinent Vitals/Pain Pain Assessment: No/denies pain     Hand Dominance Right   Extremity/Trunk Assessment Upper Extremity Assessment Upper Extremity Assessment: LUE deficits/detail LUE Deficits / Details: LUE shoulder 0/5, elbow 2/5 gravity eliminated, wrist 1/5, minimal grip strength LUE Sensation: (unable to determine) LUE Coordination: decreased fine motor;decreased gross motor   Lower Extremity Assessment Lower Extremity Assessment: Defer to PT evaluation   Cervical / Trunk Assessment Cervical / Trunk Assessment: Normal   Communication Communication Communication: Expressive difficulties   Cognition Arousal/Alertness: Awake/alert Behavior During Therapy: WFL for tasks assessed/performed Overall Cognitive Status: Within Functional Limits for tasks assessed                                                Home  Living Family/patient expects to be discharged to:: Private residence Living Arrangements: Children Available Help at Discharge: Family;Available 24 hours/Katie Type of Home: Mobile home Home Access: Stairs to enter Entrance Stairs-Number of Steps: 2 Entrance Stairs-Rails: None Home Layout: One level     Bathroom Shower/Tub: Teacher, early years/pre: Standard     Home Equipment: Environmental consultant - 2 wheels;Cane - single point;Bedside commode;Shower seat          Prior Functioning/Environment Level of Independence: Needs assistance  Gait / Transfers Assistance Needed: household ambulator without AD, has RW and SPC if needed ADL's / Homemaking Assistance Needed: Family available to assist with ADLs 24/7             OT Problem List: Decreased strength;Decreased activity tolerance;Impaired balance (sitting and/or standing);Decreased coordination;Decreased safety awareness;Decreased knowledge of use of DME or AE;Impaired UE functional use      OT Treatment/Interventions: Self-care/ADL training;Therapeutic exercise;Neuromuscular education;DME and/or AE instruction;Therapeutic activities;Patient/family education    OT Goals(Current goals can be found in the care plan section) Acute Rehab OT Goals Patient Stated Goal: not stated OT Goal Formulation: Patient unable to participate in goal setting Time For Goal Achievement: 03/27/20 Potential to Achieve Goals: Fair  OT Frequency: Min 2X/week                 End of Session    Activity Tolerance: Patient tolerated treatment well Patient left: in bed;with call bell/phone within reach;with bed alarm set  OT Visit Diagnosis: Muscle weakness (generalized) (M62.81);Hemiplegia and hemiparesis Hemiplegia - Right/Left: Left Hemiplegia - dominant/non-dominant: Non-Dominant Hemiplegia - caused by: Cerebral infarction                [id number] OT Time Calculation (min): 17 min Charges:  OT General Charges $OT Visit: 1 Visit OT Evaluation $OT Eval Moderate Complexity: [place], OTR/L  [phone] 03/13/2020, 8:08 AM

## 2020-03-13 NOTE — Progress Notes (Signed)
EEG complete - results pending 

## 2020-03-13 NOTE — Progress Notes (Signed)
Patient confused and combative at this time. Patient pulling at medical devices. Patient punching and kicking at staff when attempting to clean her up from BM. Mittens placed on patient. Unable to collect vitals and CBGs at this time. Will reassess situation after medication administration.

## 2020-03-13 NOTE — Progress Notes (Signed)
Post administration of haldol IV, patient remains irritiable and combative; able to get CBG at time but no vitals.

## 2020-03-13 NOTE — Progress Notes (Signed)
Physical Therapy Treatment Patient Details Name: [name] MRN: [id number] DOB: 1936-04-01 Today's Date: 03/13/2020    History of Present Illness [name] is a 84 y.o. female with medical history significant for atrial fibrillation, diabetes mellitus, hypertension, coronary artery disease, systolic and diastolic CHF, CVA. History is obtained from chart review and patient's daughter, at the time of my evaluation patient is somnolent and unable to give me history. Patient woke up this morning at about 8:30 AM, she was normal took her medications to eat her breakfast, she was able to ambulate, start on so far at about 9 AM.  Family noticed that patient started slurring her words at about 10:00.  Was also unable to stand to go to the bathroom.  Patient is on Eliquis and last took her medications this morning.    PT Comments    Patient presents alert, cooperative and agreeable for therapy.  Patient has difficulty using LUE for bed mobility and transfers due to poor grip strength, demonstrates slow labored movement for sitting up at bedside requiring assistance to move legs and pulling self up to sitting, able to take a few side steps at bedside with assistance to hold left hand onto walker, mostly dragging/shuffling with tactile assistance to move LLE due to weakness and tolerated sitting up in chair after therapy with BLE elevated - RN notified.  Patient will benefit from continued physical therapy in hospital and recommended venue below to increase strength, balance, endurance for safe ADLs and gait.    Follow Up Recommendations  SNF     Equipment Recommendations  None recommended by PT    Recommendations for Other Services       Precautions / Restrictions Precautions Precautions: Fall Restrictions Weight Bearing Restrictions: No    Mobility  Bed Mobility Overal bed mobility: Needs Assistance Bed Mobility: Supine to Sit     Supine to sit: Mod assist     General bed  mobility comments: inceased time, labored movement, limited use of LUE due to weakness and required assistance to move BLE  Transfers Overall transfer level: Needs assistance Equipment used: Rolling walker (2 wheeled) Transfers: Sit to/from [facility] Sit to Stand: Mod assist Stand pivot transfers: Mod assist       General transfer comment: slow labored movement, had to hold left hand onto RW, but patient able to support self using left elbow and shoulder muscles  Ambulation/Gait Ambulation/Gait assistance: Mod assist;Max assist Gait Distance (Feet): 4 Feet Assistive device: Rolling walker (2 wheeled) Gait Pattern/deviations: Decreased step length - right;Decreased step length - left;Decreased stance time - left;Decreased stride length;Shuffle Gait velocity: slow   General Gait Details: limited to 4-5 slow labored side steps at bedside due to left sided weakness, able to support self on left side with assistance to hold hand onto RW, mostly shuffling steps with LLE requiring tactile assistance to move   Stairs             Wheelchair Mobility    Modified Rankin (Stroke Patients Only)       Balance Overall balance assessment: Needs assistance Sitting-balance support: Feet supported;No upper extremity supported Sitting balance-Leahy Scale: Fair Sitting balance - Comments: seated at EOB, mild left lateral lean without loss of balance Postural control: Left lateral lean Standing balance support: During functional activity;Bilateral upper extremity supported Standing balance-Leahy Scale: Poor Standing balance comment: using RW with assistance to hold left hand onto walker  Cognition Arousal/Alertness: Awake/alert Behavior During Therapy: WFL for tasks assessed/performed Overall Cognitive Status: Within Functional Limits for tasks assessed                                        Exercises General  Exercises - Lower Extremity Long Arc Quad: Seated;AROM;Strengthening;Both;10 reps Long Arc Quad Limitations: limited ROM LLE due to weakness Hip Flexion/Marching: Seated;AROM;Strengthening;Both;10 reps Toe Raises: Seated;AROM;Strengthening;Both;10 reps Heel Raises: Seated;AROM;Strengthening;Both;10 reps    General Comments        Pertinent Vitals/Pain Pain Assessment: No/denies pain    Home Living Family/patient expects to be discharged to:: Private residence Living Arrangements: Children Available Help at Discharge: Family;Available 24 hours/day Type of Home: Mobile home Home Access: Stairs to enter Entrance Stairs-Rails: None Home Layout: One level Home Equipment: Environmental consultant - 2 wheels;Cane - single point;Bedside commode;Shower seat      Prior Function Level of Independence: Needs assistance  Gait / Transfers Assistance Needed: household ambulator without AD, has RW and SPC if needed ADL's / Homemaking Assistance Needed: Family available to assist with ADLs 24/7     PT Goals (current goals can now be found in the care plan section) Acute Rehab PT Goals Patient Stated Goal: Return home after rehab PT Goal Formulation: With patient Time For Goal Achievement: 03/26/20 Potential to Achieve Goals: Good Progress towards PT goals: Progressing toward goals    Frequency    Min 5X/week      PT Plan Current plan remains appropriate    Co-evaluation              AM-PAC PT "6 Clicks" Mobility   Outcome Measure  Help needed turning from your back to your side while in a flat bed without using bedrails?: A Lot Help needed moving from lying on your back to sitting on the side of a flat bed without using bedrails?: A Lot Help needed moving to and from a bed to a chair (including a wheelchair)?: A Lot Help needed standing up from a chair using your arms (e.g., wheelchair or bedside chair)?: A Lot Help needed to walk in hospital room?: A Lot Help needed climbing 3-5 steps  with a railing? : Total 6 Click Score: 11    End of Session   Activity Tolerance: Patient tolerated treatment well;Patient limited by fatigue Patient left: in chair;with call bell/phone within reach;with chair alarm set Nurse Communication: Mobility status PT Visit Diagnosis: Unsteadiness on feet (R26.81);Other abnormalities of gait and mobility (R26.89);Muscle weakness (generalized) (M62.81);Hemiplegia and hemiparesis Hemiplegia - Right/Left: Left Hemiplegia - dominant/non-dominant: Non-dominant Hemiplegia - caused by: Cerebral infarction     Time: [id number] PT Time Calculation (min) (ACUTE ONLY): 32 min  Charges:  $Therapeutic Exercise: 8-22 mins $Therapeutic Activity: 8-22 mins                     10:22 AM, 03/13/20 Lonell Grandchild, MPT Physical Therapist with [facility] [phone] office [phone] mobile phone

## 2020-03-13 NOTE — Progress Notes (Signed)
Pt swallowing re-evaluated. Swallow study to be completed this afternoon. Told by charge RN to hold PO medications and feeding tube insertion pending swallow study. Pt comfortable and resting in her recliner at this time. States she is ready to eat. Will continue to monitor patient.

## 2020-03-13 NOTE — Care Management Important Message (Signed)
Important Message  Patient Details  Name: [name] MRN: [id number] Date of Birth: [birth year]   Medicare Important Message Given:  Yes     Tommy Medal 03/13/2020, 11:49 AM

## 2020-03-13 NOTE — Progress Notes (Addendum)
PROGRESS NOTE [facility] [name]  BUL:[id number]  DOB: Sep 26, 1936  DOA: 03/10/2020 PCP: Jani Gravel, MD   Brief Admission Hx: 84 year old female with chronic atrial fibrillation, diabetes mellitus type 2, hypertension, biventricular heart failure, cerebrovascular disease and coronary artery disease presents with sudden onset of left-sided weakness.  After speaking with her son who is her primary caretaker he tells me that there has been a problem with her medications as she had missed some doses of medications when she was with other family members but he notes that he usually makes sure that she has not missed any doses.  She presents markedly volume overloaded with pulmonary edema and I also worry that she missed some of her Eliquis doses which may have precipitated the CVA.   MDM/Assessment & Plan:   1. Acute embolic CVA-repeated MRI with light sedation confirms acute infarction.   Unfortunately patient continues to have significant dysphagia. I spoke with daughter and son.   She will have to be n.p.o. for now as she is not safe for swallowing according to SLP.  I received a call back from the son who reports he met with family members and they have all agreed that they want to have an NG tube placed to start tube feeding.  They understand that the NG tube is a temporary measure and that she may require PEG placement if her swallow function does not improve.   PT/OT/SLP eval recommending SNF.  Pt likely will need SNF placement however after speaking with son, he may not be agreeable, will address daily with family.    Neurology has seen and recommended ASA 325 mg and consider switching to xarelto however due to her CKD it may me reasonable to stay on apixaban.  I expressed to family that patient is not doing well and likely would not tolerate a feeding tube as she is very agitated and confused and does not tolerate much interaction.  I think that comfort care is appropriate.  Pt has  large family and remain undecided at this time.   2. Chronic atrial fibrillation - rate currently controlled, she had been anticoagulated with apixaban but neuro recommending consider switching to xarelto.  I do not consider patient's case to be of failure of apixaban as she was not receiving it regularly as told to me by family members. 3. Hypertension-blood pressures elevated, we allowed for permissive hypertension in the setting of the acute CVA and now after 48 hours we will slowly start to lower blood pressure to more normal levels.  I have started Lopressor IV 2.5 mg every 6 hours.  After NG tube was placed and we are able to give oral medications will make further adjustments. 4. History of remote seizure activity-she had been started on Keppra by neurology earlier this year as empiric treatment for suspected unwitnessed seizures.  Keppra IV ordered for now.  Hopefully can transition to oral Keppra after the NG tube has been placed. 5. HFrEF - Pt was initially very volume overloaded and I suspect has not been receiving lasix as prescribed as family members have told me she had missed doses of medications.  She had findings of pulmonary edema on chest xray.  She was diuresed with IV lasix.  IV Lasix has been discontinued. 6. CKD stage 3 - Monitoring creatinine closely. 7. Type 2 DM - had been controlled on Actos, Januvia,- Would rethink starting her back on actos with her known biventricular CHF, hold januvia while in hospital. Monitor CBGs.  SSI as needed.  8. Chronic respiratory failure - continue supplemental oxygen.  9. Gout - resume allopurinol when able.  10. Dysphagia - NPO for now.  SLP following.  11. Hypernatremia -resolved after receiving free water.  Recheck in AM.  12. Hypokalemia -IV replacement ordered as patient is still n.p.o. and NG tube has not been placed yet.   DVT prophylaxis: heparin  Code Status: Full  Family Communication: son [name] and daughter [name] updated by  telephone, there are complex family dynamics involved.  I spoke with [name] at the bedside today.  He is agreeable to placement of NG tube and starting tube feeding. Disposition Plan: NG tube being placed today, starting tube feeding today, restarting apixaban today when NG tube placed, continue inpatient stroke work up, subspecialty consultation (neurology), palliative consult, decisions about nutrition, not ready for discharge, ongoing discussions about PEG tube.    Consultants:  Neurology  Palliative   Procedures:    Antimicrobials:     Subjective: Pt awake and trying to vocalize although her speech is slurred.  Copious oral secretions remain.  Objective: Vitals:   03/13/20 0537 03/13/20 0754 03/13/20 0938 03/13/20 1133  BP: (!) 152/103  (!) 150/101 (!) 155/99  Pulse: 88  86 68  Resp: 17     Temp: 98.1 F (36.7 C)     TempSrc: Axillary     SpO2: 100% 99%    Weight:      Height:        Intake/Output Summary (Last 24 hours) at 03/13/2020 1245 Last data filed at 03/13/2020 0947 Gross per 24 hour  Intake 305.42 ml  Output 2450 ml  Net -2144.58 ml   Filed Weights   03/10/20 1317 03/10/20 1853 03/12/20 0500  Weight: 90.7 kg 86.3 kg 79.3 kg   REVIEW OF SYSTEMS  UTO due to current condition   Exam:  General exam: elderly frail female, awake, somnolent, NAD.  Notes left facial droop and tongue protruding to the left.  Copious oral secretions.  Left eye closed. Respiratory system: No crackles or rails heard today. No increased work of breathing. Cardiovascular system: irregularly irregular, S1 & S2 heard. Mild JVD, 1+ pedal edema. Gastrointestinal system: Abdomen is nondistended, soft and nontender. Normal bowel sounds heard. Central nervous system: somnolent, left facial droop, tongue protrudes to the left.  Pronounced dense left hemiparesis unchanged. Extremities: Trace pretibial edema BLEs.   Data Reviewed: Basic Metabolic Panel: Recent Labs  Lab 03/10/20 1312  03/11/20 0404 03/12/20 0532 03/13/20 0506  NA 144  144 146* 147* 143  K 3.5  3.5 3.7 3.2* 3.2*  CL 109  108 109 103 97*  CO2 '26 26 30 31  ' GLUCOSE 153*  151* 104* 95 111*  BUN '20  19 19 17 15  ' CREATININE 1.71*  1.80* 1.67* 1.70* 1.65*  CALCIUM 8.9 8.9 9.2 9.6  MG 2.1  --  1.9 1.8   Liver Function Tests: Recent Labs  Lab 03/10/20 1312 03/11/20 0404  AST 13* 11*  ALT 12 11  ALKPHOS 149* 126  BILITOT 0.3 0.7  PROT 6.8 6.4*  ALBUMIN 3.1* 3.0*   No results for input(s): LIPASE, AMYLASE in the last 168 hours. No results for input(s): AMMONIA in the last 168 hours. CBC: Recent Labs  Lab 03/10/20 1312 03/11/20 0404 03/12/20 0532 03/13/20 0506  WBC 5.3 5.8 4.7 4.9  NEUTROABS 3.4  --   --   --   HGB 10.9*  10.1* 9.4* 10.2* 11.1*  HCT 32.0*  33.9* 32.5* 34.7* 37.2  MCV 89.0 90.8 91.6 87.5  PLT 242 247 209 255   Cardiac Enzymes: No results for input(s): CKTOTAL, CKMB, CKMBINDEX, TROPONINI in the last 168 hours. CBG (last 3)  Recent Labs    03/13/20 0520 03/13/20 0756 03/13/20 1213  GLUCAP 103* 102* 119*   Recent Results (from the past 240 hour(s))  Urine culture     Status: Abnormal   Collection Time: 03/10/20  3:38 PM   Specimen: Urine, Random  Result Value Ref Range Status   Specimen Description   Final    URINE, RANDOM Performed at [facility], [address] [place], [place] [address]    Special Requests   Final    NONE Performed at [facility], [address] [place], [place] [address]    Culture >=100,000 COLONIES/mL Su Hoff (A)  Final   Report Status 03/12/2020 FINAL  Final   Organism ID, Bacteria CITROBACTER YOUNGAE (A)  Final      Susceptibility   Citrobacter youngae - MIC*    CEFAZOLIN >=64 RESISTANT Resistant     CEFTRIAXONE >=64 RESISTANT Resistant     CIPROFLOXACIN <=0.25 SENSITIVE Sensitive     GENTAMICIN <=1 SENSITIVE Sensitive     IMIPENEM 1 SENSITIVE Sensitive     NITROFURANTOIN <=16 SENSITIVE Sensitive      TRIMETH/SULFA <=20 SENSITIVE Sensitive     PIP/TAZO 64 INTERMEDIATE Intermediate     * >=100,000 COLONIES/mL CITROBACTER YOUNGAE  Respiratory Panel by RT PCR (Flu A&B, Covid) - Nasopharyngeal Swab     Status: None   Collection Time: 03/10/20  3:39 PM   Specimen: Nasopharyngeal Swab  Result Value Ref Range Status   SARS Coronavirus 2 by RT PCR NEGATIVE NEGATIVE Final    Comment: (NOTE) SARS-CoV-2 target nucleic acids are NOT DETECTED. The SARS-CoV-2 RNA is generally detectable in upper respiratoy specimens during the acute phase of infection. The lowest concentration of SARS-CoV-2 viral copies this assay can detect is 131 copies/mL. A negative result does not preclude SARS-Cov-2 infection and should not be used as the sole basis for treatment or other patient management decisions. A negative result may occur with  improper specimen collection/handling, submission of specimen other than nasopharyngeal swab, presence of viral mutation(s) within the areas targeted by this assay, and inadequate number of viral copies (<131 copies/mL). A negative result must be combined with clinical observations, patient history, and epidemiological information. The expected result is Negative. Fact Sheet for Patients:  PinkCheek.be Fact Sheet for Healthcare Providers:  GravelBags.it This test is not yet ap proved or cleared by the [place] FDA and  has been authorized for detection and/or diagnosis of SARS-CoV-2 by FDA under an Emergency Use Authorization (EUA). This EUA will remain  in effect (meaning this test can be used) for the duration of the COVID-19 declaration under Section 564(b)(1) of the Act, 21 U.S.C. section 360bbb-3(b)(1), unless the authorization is terminated or revoked sooner.    Influenza A by PCR NEGATIVE NEGATIVE Final   Influenza B by PCR NEGATIVE NEGATIVE Final    Comment: (NOTE) The Xpert Xpress SARS-CoV-2/FLU/RSV  assay is intended as an aid in  the diagnosis of influenza from Nasopharyngeal swab specimens and  should not be used as a sole basis for treatment. Nasal washings and  aspirates are unacceptable for Xpert Xpress SARS-CoV-2/FLU/RSV  testing. Fact Sheet for Patients: PinkCheek.be Fact Sheet for Healthcare Providers: GravelBags.it This test is not yet approved or cleared by the [place] FDA and  has  been authorized for detection and/or diagnosis of SARS-CoV-2 by  FDA under an Emergency Use Authorization (EUA). This EUA will remain  in effect (meaning this test can be used) for the duration of the  Covid-19 declaration under Section 564(b)(1) of the Act, 21  U.S.C. section 360bbb-3(b)(1), unless the authorization is  terminated or revoked. Performed at [facility], [address] [place], [place] [address]   SARS CORONAVIRUS 2 (TAT 6-24 HRS) Nasopharyngeal Nasopharyngeal Swab     Status: None   Collection Time: 03/10/20  4:19 PM   Specimen: Nasopharyngeal Swab  Result Value Ref Range Status   SARS Coronavirus 2 NEGATIVE NEGATIVE Final    Comment: (NOTE) SARS-CoV-2 target nucleic acids are NOT DETECTED. The SARS-CoV-2 RNA is generally detectable in upper and lower respiratory specimens during the acute phase of infection. Negative results do not preclude SARS-CoV-2 infection, do not rule out co-infections with other pathogens, and should not be used as the sole basis for treatment or other patient management decisions. Negative results must be combined with clinical observations, patient history, and epidemiological information. The expected result is Negative. Fact Sheet for Patients: SugarRoll.be Fact Sheet for Healthcare Providers: https://www.woods-mathews.com/ This test is not yet approved or cleared by the [place] FDA and  has been authorized for detection and/or  diagnosis of SARS-CoV-2 by FDA under an Emergency Use Authorization (EUA). This EUA will remain  in effect (meaning this test can be used) for the duration of the COVID-19 declaration under Section 56 4(b)(1) of the Act, 21 U.S.C. section 360bbb-3(b)(1), unless the authorization is terminated or revoked sooner. Performed at [facility], [place] [address] [place], [place] [address]   MRSA PCR Screening     Status: Abnormal   Collection Time: 03/10/20  6:18 PM   Specimen: Nasal Mucosa; Nasopharyngeal  Result Value Ref Range Status   MRSA by PCR POSITIVE (A) NEGATIVE Final    Comment:        The GeneXpert MRSA Assay (FDA approved for NASAL specimens only), is one component of a comprehensive MRSA colonization surveillance program. It is not intended to diagnose MRSA infection nor to guide or monitor treatment for MRSA infections. RESULT CALLED TO, READ BACK BY AND VERIFIED WITH: Kirtland Bouchard '@[id number]'  03/11/20 [facility] Performed at [facility], [address], [place], [place] [address]      Studies: DG CHEST PORT 1 VIEW  Result Date: 03/12/2020 CLINICAL DATA:  Pulmonary edema. EXAM: PORTABLE CHEST 1 VIEW COMPARISON:  03/11/2020. FINDINGS: Cardiomegaly with pulmonary venous congestion again noted. Bilateral interstitial prominence consistent interstitial edema again noted. Pneumonitis cannot be excluded. Small left pleural effusion again noted. No pneumothorax degenerative change thoracic spine. IMPRESSION: Cardiomegaly with pulmonary venous congestion and bilateral interstitial prominence consistent with interstitial edema again noted. Small left pleural effusion again noted. Similar findings noted on prior study of 03/11/2020. Electronically Signed   By: Marcello Moores  Register   On: 03/12/2020 06:00   Scheduled Meds: . apixaban  2.5 mg Oral BID  . aspirin  300 mg Rectal Daily   Or  . aspirin  325 mg Oral Daily  . Chlorhexidine Gluconate Cloth  6 each Topical Q0600  . insulin aspart   0-9 Units Subcutaneous Q4H  . memantine  5 mg Oral BID  . metoprolol tartrate  2.5 mg Intravenous Q6H  . mupirocin ointment  1 application Nasal BID   Continuous Infusions: . feeding supplement (OSMOLITE 1.2 CAL)    . levETIRAcetam 500 mg (03/13/20 0252)  .  potassium chloride      Principal Problem:   Acute metabolic encephalopathy Active Problems:   Type 2 diabetes mellitus with hyperlipidemia (HCC)   Essential hypertension   CVA (cerebral vascular accident) ([place])   Acute on chronic combined systolic and diastolic CHF (congestive heart failure) (HCC)   Right bundle branch block   Atrial fibrillation with RVR (HCC)   Atrial fibrillation, chronic (HCC)   Left-sided weakness   Goals of care, counseling/discussion   Palliative care by specialist  Time spent:   Irwin Brakeman, MD [facility] 03/13/2020, 12:45 PM    LOS: 3 days  How to contact the [facility] Attending or Consulting provider [place] or covering provider during after hours [place], for this patient?  1. Check the care team in [facility] and look for a) attending/consulting TRH provider listed and b) the [facility] team listed 2. Log into [email or web address] and use [place]'s universal password to access. If you do not have the password, please contact the hospital operator. 3. Locate the [facility] provider you are looking for under [facility] and page to a number that you can be directly reached. 4. If you still have difficulty reaching the provider, please page the [facility] (Director on Call) for the Hospitalists listed on amion for assistance.

## 2020-03-13 NOTE — Procedures (Signed)
Patient Name: [name]  MRN: [id number]  Epilepsy Attending: Lora Havens  Referring Physician/Provider: Dr Jenetta Downer Date: 03/13/2020 Duration: 24.42 mins  Patient history: 83 year old female with history of seizures and acute infarct.  EEG evaluate for seizures.  Level of alertness: awake  AEDs during EEG study: Keppra  Technical aspects: This EEG study was done with scalp electrodes positioned according to the 10-20 International system of electrode placement. Electrical activity was acquired at a sampling rate of 500Hz  and reviewed with a high frequency filter of 70Hz  and a low frequency filter of 1Hz . EEG data were recorded continuously and digitally stored.   DESCRIPTION: No clear posterior dominant rhythm consists of 9-10 Hz activity of moderate voltage (25-35 uV) was seen. EEG showed continuous generalized 3-5Hz  theta-delta slowing. Hyperventilation and photic stimulation were not performed.  ABNORMALITY - Continuous slow, generalized   IMPRESSION: This study is suggestive of mild to moderate diffuse encephalopathy, non specific to etiology. No seizures or epileptiform discharges were seen throughout the recording.  [name] Barbra Sarks

## 2020-03-13 NOTE — Plan of Care (Signed)
  Problem: SLP Dysphagia Goals Goal: Patient will utilize recommended strategies Description: Patient will utilize recommended strategies during swallow to increase swallowing safety with mod/max verbal cues Flowsheets (Taken 03/13/2020 2139) Patient will utilize recommended strategies during swallow to increase swallowing safety with:  mod assist  max assist

## 2020-03-13 NOTE — Evaluation (Signed)
Modified Barium Swallow Progress Note  Patient Details  Name: [name] MRN: [id number] Date of Birth: 05-12-1936  Today's Date: 03/13/2020  Modified Barium Swallow completed.  Full report located under Chart Review in the Imaging Section.  Brief recommendations include the following:  Clinical Impression  Pt presents with moderate oral phase dysphagia and very mild pharyngeal phase dysphagia; no aspiration was observed. Oral phase is characterized by anterior loss of bolus on the L, reduced posterior propulsion of bolus, lingual pumping, premature spillage to the level of the pyriforms and mild oral residue after the swallow. Pharyngeal phase is characterized by good hyolaryngeal excursion, consistent laryngeal vestibule closure and adequate airway protection. Slightly decreased pharyngeal squeeze results in mild pharyngeal residue in the valleculae and pyriforms that is cleared when Pt produces an additional dry swallow (Pt requires verbal cues to produce this additional swallow). Pt was unable to thoroughly masticate solid textures which were eventually manually removed from oral cavity; puree textures were consumed with prolonged AP transit.  Pt demonstrates increased oral control of thin liquids when she self feeds cup sips; she does require hand over hand assist, however. Note brief stasis of the barium tablet in the valleculae that easily passed through when Pt was provided a tsp of puree. Recommend initiate D1/puree diet and thin liquids. Pt requires 1:1 assist and recommend strategies: alternate small bites and sips, Pt to self feed cup sips (feeder provide hand over hand), Pt to produce an additional dry swallow after each bite or sip, after meals check L sulcus for residue. Meds to be adminsitered whole in applesauce. SLP reviewed recommendations and MBS results with Pt's daughter. Recommend HH ST to f/u upon d/c home.    Swallow Evaluation Recommendations       SLP Diet  Recommendations: Dysphagia 1 (Puree) solids;Thin liquid   Liquid Administration via: Cup   Medication Administration: Whole meds with puree   Supervision: Patient able to self feed;Full assist for feeding;Staff to assist with self feeding;Full supervision/cueing for compensatory strategies   Compensations: Minimize environmental distractions;Slow rate;Small sips/bites;Multiple dry swallows after each bite/sip;Monitor for anterior loss;Lingual sweep for clearance of pocketing;Follow solids with liquid   Postural Changes: Remain semi-upright after after feeds/meals (Comment);Seated upright at 90 degrees   Oral Care Recommendations: Oral care BID      [name] H. Roddie Mc, CCC-SLP Speech Language Pathologist   Wende Bushy 03/13/2020,9:35 PM

## 2020-03-13 NOTE — Progress Notes (Signed)
ANTICOAGULATION CONSULT NOTE - Initial Consult  Pharmacy Consult for Apixaban Indication: atrial fibrillation  Allergies  Allergen Reactions  . Yellow Jacket Venom [Bee Venom] Shortness Of Breath and Itching    Patient Measurements: Height: 5\' 5"  (165.1 cm) Weight: (77.6 kg) IBW/kg (Calculated) : 57  Vital Signs: Temp: 98.1 F (36.7 C) (04/02 0537) Temp Source: Axillary (04/02 0537) BP: 155/99 (04/02 1133) Pulse Rate: 68 (04/02 1133)  Labs: Recent Labs    03/10/20 1312 03/10/20 1312 03/11/20 0404 03/11/20 0404 03/12/20 0532 03/13/20 0506  HGB 10.9*  10.1*   < > 9.4*   < > 10.2* 11.1*  HCT 32.0*  33.9*   < > 32.5*  --  34.7* 37.2  PLT 242   < > 247  --  209 255  APTT 39*  --   --   --   --   --   LABPROT 17.9*  --   --   --   --   --   INR 1.5*  --   --   --   --   --   CREATININE 1.71*  1.80*   < > 1.67*  --  1.70* 1.65*   < > = values in this interval not displayed.    Estimated Creatinine Clearance: 27.8 mL/min (A) (by C-G formula based on SCr of 1.65 mg/dL (H)).   Medical History: Past Medical History:  Diagnosis Date  . Asthma   . Bronchitis   . Carotid artery disease ([place])   . Coronary atherosclerosis of native coronary artery    Nonobstructive at cath 2005  . DM2 (diabetes mellitus, type 2) ([place])   . Essential hypertension   . Gout   . History of stroke   . Hypothyroidism   . Influenza 2013  . Nonischemic cardiomyopathy (HCC)    LVEF 20-25%  . PAF (paroxysmal atrial fibrillation) ([place])    a. diagnosed in 04/2018. Started on Eliquis for anticoagulation.   . Renal artery stenosis (HCC)    Bilateral    Medications:  Medications Prior to Admission  Medication Sig Dispense Refill Last Dose  . carvedilol (COREG) 12.5 MG tablet TAKE 1 TABLET(12.5 MG) BY MOUTH TWICE DAILY WITH A MEAL (Patient taking differently: Take 12.5 mg by mouth 2 (two) times daily with a meal. ) 60 tablet 6 03/10/2020 at 0900  . cholecalciferol (VITAMIN D) 25 MCG (1000 UT)  tablet Take 1,000 Units by mouth 2 (two) times daily.   03/10/2020 at Unknown time  . donepezil (ARICEPT) 10 MG tablet Take 10 mg by mouth daily.   03/10/2020 at Unknown time  . ELIQUIS 2.5 MG TABS tablet TAKE ONE TABLET BY MOUTH TWICE A DAY (Patient taking differently: Take 2.5 mg by mouth 2 (two) times daily. ) 60 tablet 0 03/10/2020 at 0900  . gabapentin (NEURONTIN) 100 MG capsule Take 100 mg by mouth 3 (three) times daily.    03/10/2020 at Unknown time  . hydrALAZINE (APRESOLINE) 50 MG tablet Take 1.5 tablets (75 mg total) by mouth every 8 (eight) hours. 180 tablet 1 03/10/2020 at Unknown time  . isosorbide mononitrate (IMDUR) 30 MG 24 hr tablet Take 1 tablet (30 mg total) by mouth daily. 90 tablet 3 unknown  . levETIRAcetam (KEPPRA) 500 MG tablet Take 1 tablet (500 mg total) by mouth 2 (two) times daily. 60 tablet 1 03/10/2020 at Unknown time  . pioglitazone (ACTOS) 15 MG tablet Take 15 mg by mouth daily with breakfast.   12 03/10/2020 at Unknown  time  . sitaGLIPtin (JANUVIA) 50 MG tablet Take 50 mg by mouth daily.   03/10/2020 at Unknown time  . fluticasone (FLONASE) 50 MCG/ACT nasal spray Place 1 spray into both nostrils daily as needed for allergies or rhinitis.      . OXYGEN Inhale 2 L into the lungs daily as needed (for shortness of breath).   PRN at PRN    Assessment: 84 year old female with chronic atrial fibrillation, diabetes mellitus type 2, hypertension, biventricular heart failure, cerebrovascular disease and coronary artery disease presents with sudden onset of left-sided weakness. She was diagnosed with acute embolic CVA. Her son informed the MD that she has missed some doses of medications. Pharmacy asked to restart eliquis.  Goal of Therapy:  Monitor platelets by anticoagulation protocol: Yes   Plan:  Restart eliquis 2.5mg  po bid Monitor for S/S of bleeding  Isac Sarna, BS Vena [place], BCPS Clinical Pharmacist Pager [phone] 03/13/2020,11:56 AM

## 2020-03-14 LAB — GLUCOSE, CAPILLARY
Glucose-Capillary: 117 mg/dL — ABNORMAL HIGH (ref 70–99)
Glucose-Capillary: 121 mg/dL — ABNORMAL HIGH (ref 70–99)
Glucose-Capillary: 95 mg/dL (ref 70–99)
Glucose-Capillary: 100 mg/dL — ABNORMAL HIGH (ref 70–99)
Glucose-Capillary: 139 mg/dL — ABNORMAL HIGH (ref 70–99)

## 2020-03-14 LAB — BASIC METABOLIC PANEL
Anion gap: 12 (ref 5–15)
BUN: 16 mg/dL (ref 8–23)
CO2: 30 mmol/L (ref 22–32)
Calcium: 9.5 mg/dL (ref 8.9–10.3)
Chloride: 99 mmol/L (ref 98–111)
Creatinine, Ser: 1.53 mg/dL — ABNORMAL HIGH (ref 0.44–1.00)
GFR calc Af Amer: 37 mL/min — ABNORMAL LOW (ref >60)
GFR calc non Af Amer: 32 mL/min — ABNORMAL LOW (ref >60)
Glucose, Bld: 127 mg/dL — ABNORMAL HIGH (ref 70–99)
Potassium: 3.3 mmol/L — ABNORMAL LOW (ref 3.5–5.1)
Sodium: 141 mmol/L (ref 135–145)

## 2020-03-14 LAB — CBC
HCT: 37.9 % (ref 36.0–46.0)
Hemoglobin: 11.4 g/dL — ABNORMAL LOW (ref 12.0–15.0)
MCH: 26.4 pg (ref 26.0–34.0)
MCHC: 30.1 g/dL (ref 30.0–36.0)
MCV: 87.7 fL (ref 80.0–100.0)
Platelets: 236 10*3/uL (ref 150–400)
RBC: 4.32 MIL/uL (ref 3.87–5.11)
RDW: 14.3 % (ref 11.5–15.5)
WBC: 4.9 10*3/uL (ref 4.0–10.5)
nRBC: 0 % (ref 0.0–0.2)

## 2020-03-14 LAB — MAGNESIUM: Magnesium: 1.9 mg/dL (ref 1.7–2.4)

## 2020-03-14 MED ORDER — POTASSIUM CHLORIDE CRYS ER 20 MEQ PO TBCR
40.0000 meq | EXTENDED_RELEASE_TABLET | Freq: Once | ORAL | Status: AC
  Administered 2020-03-14: 40 meq via ORAL
  Filled 2020-03-14: qty 2

## 2020-03-14 MED ORDER — INSULIN ASPART 100 UNIT/ML ~~LOC~~ SOLN
0.0000 [IU] | Freq: Three times a day (TID) | SUBCUTANEOUS | Status: DC
  Administered 2020-03-14 – 2020-03-15 (×2): 1 [IU] via SUBCUTANEOUS
  Administered 2020-03-16 – 2020-03-17 (×2): 2 [IU] via SUBCUTANEOUS

## 2020-03-14 MED ORDER — LEVETIRACETAM 100 MG/ML PO SOLN
500.0000 mg | Freq: Two times a day (BID) | ORAL | Status: DC
  Administered 2020-03-14 – 2020-03-17 (×7): 500 mg via ORAL
  Filled 2020-03-14 (×9): qty 5

## 2020-03-14 MED ORDER — HALOPERIDOL LACTATE 5 MG/ML IJ SOLN: 2.0000 mg | Freq: Four times a day (QID) | INTRAMUSCULAR | Status: DC | PRN

## 2020-03-14 MED ORDER — CARVEDILOL 3.125 MG PO TABS
6.2500 mg | ORAL_TABLET | Freq: Two times a day (BID) | ORAL | Status: DC
  Administered 2020-03-14 – 2020-03-15 (×2): 6.25 mg via ORAL
  Administered 2020-03-15: 86.25 mg via ORAL
  Administered 2020-03-16 – 2020-03-17 (×3): 6.25 mg via ORAL
  Filled 2020-03-14 (×6): qty 2

## 2020-03-14 MED ORDER — LEVETIRACETAM 500 MG PO TABS: 500.0000 mg | ORAL_TABLET | Freq: Two times a day (BID) | ORAL | Status: DC

## 2020-03-14 MED ORDER — AMLODIPINE BESYLATE 5 MG PO TABS
5.0000 mg | ORAL_TABLET | Freq: Every day | ORAL | Status: DC
  Administered 2020-03-14 – 2020-03-16 (×3): 5 mg via ORAL
  Filled 2020-03-14 (×3): qty 1

## 2020-03-14 NOTE — Progress Notes (Signed)
PROGRESS NOTE [name] [name]  MVE:[id number]  DOB: 1936/01/16  DOA: 03/10/2020 PCP: Katie Gravel, MD   Brief Admission Hx: 84 year old female with chronic atrial fibrillation, diabetes mellitus type 2, hypertension, biventricular heart failure, cerebrovascular disease and coronary artery disease presents with sudden onset of left-sided weakness.  After speaking with her son who is her primary caretaker he tells me that there has been a problem with her medications as she had missed some doses of medications when she was with other family members but he notes that he usually makes sure that she has not missed any doses.  She presents markedly volume overloaded with pulmonary edema and I also worry that she missed some of her Eliquis doses which may have precipitated the CVA.   MDM/Assessment & Plan:   1. Acute embolic CVA-repeated MRI with light sedation confirms acute infarction.   Unfortunately patient continues to have significant dysphagia but had barium swallow on 4/2 and SLP have pla. I spoke with daughter and son.   She will have to be n.p.o. for now as she is not safe for swallowing according to SLP.  I received a call back from the son who reports he met with family members and they have all agreed that they want to have an NG tube placed to start tube feeding.  They understand that the NG tube is a temporary measure and that she may require PEG placement if her swallow function does not improve.   PT/OT/SLP eval recommending SNF.  Pt likely will need SNF placement however after speaking with son, he may not be agreeable, will address daily with family.    Neurology has seen and recommended ASA 325 mg and consider switching to xarelto however due to her CKD it may me reasonable to stay on apixaban.  I expressed to family that patient is not doing well and likely would not tolerate a feeding tube as she is very agitated and confused and does not tolerate much interaction.    2. Dysphagia - Fortunately after MBS done 4/2 patient is now able to have a dysphagia diet and we were able to avoid placing NG tube.     3. Chronic atrial fibrillation - rate currently controlled, she had been anticoagulated with apixaban but neuro recommending consider switching to xarelto.  I do not consider patient's case to be of failure of apixaban as she was not receiving it regularly as told to me by family members. 4. Hypertension-blood pressures elevated, we allowed for permissive hypertension in the setting of the acute CVA and now after 48 hours we will slowly start to lower blood pressure to more normal levels.  Starting coreg 6.25 mg BID, amlodipine 5 mg daily.    5. History of remote seizure activity-she had been started on Keppra by neurology earlier this year as empiric treatment for suspected unwitnessed seizures.  Keppra IV ordered for now.  Hopefully can transition to oral Keppra after the NG tube has been placed. 6. HFrEF - Pt was initially very volume overloaded and I suspect has not been receiving lasix as prescribed as family members have told me she had missed doses of medications.  She had findings of pulmonary edema on chest xray.  She was diuresed with IV lasix.  IV Lasix has been discontinued. 7. CKD stage 3 - Monitoring creatinine closely. 8. Type 2 DM - had been controlled on Actos, Januvia,- Would rethink starting her back on actos with her known biventricular CHF, hold [place]  while in hospital. Monitor CBGs.  SSI as needed.  9. Chronic respiratory failure - continue supplemental oxygen.  10. Gout - resume allopurinol when able.   11. Hypernatremia -resolved after receiving free water.  Recheck in AM.  12. Hypokalemia -oral replacement, mag is WNL.   DVT prophylaxis: heparin  Code Status: Full  Family Communication: son [name] and daughter [name] updated by telephone, there are complex family dynamics involved.  I spoke with [name] at the bedside 03/13/20.    Disposition Plan: family still resistant to SNF placement, patient likely to go home with [facility] and 24/7 supervision.    Consultants:  Neurology  Palliative   Procedures:    Antimicrobials:     Subjective: Pt has been sundowning at night, but she is eating better now.  Tolerating dysphagia diet.  Dense left hemiparesis remains.    Objective: Vitals:   03/13/20 2330 03/14/20 0350 03/14/20 0443 03/14/20 0502  BP: (!) 147/119  (!) 155/98   Pulse: 91  85 86  Resp:   16   Temp: 99 F (37.2 C)  99.8 F (37.7 C)   TempSrc: Oral  Oral   SpO2: 96%   100%  Weight:  78.1 kg    Height:        Intake/Output Summary (Last 24 hours) at 03/14/2020 1123 Last data filed at 03/13/2020 1833 Gross per 24 hour  Intake 320 ml  Output --  Net 320 ml   Filed Weights   03/10/20 1853 03/12/20 0500 03/14/20 0350  Weight: 86.3 kg 79.3 kg 78.1 kg   REVIEW OF SYSTEMS  UTO due to current condition   Exam:  General exam: elderly frail female, awake, somnolent, NAD.  Notes left facial droop and tongue protruding to the left.  Copious oral secretions.  Left eye closed. Respiratory system: No crackles or rails heard today. No increased work of breathing. Cardiovascular system: irregularly irregular, S1 & S2 heard. Mild JVD, 1+ pedal edema. Gastrointestinal system: Abdomen is nondistended, soft and nontender. Normal bowel sounds heard. Central nervous system: somnolent, left facial droop, tongue protrudes to the left.  Pronounced dense left hemiparesis unchanged. Extremities: Trace pretibial edema BLEs.   Data Reviewed: Basic Metabolic Panel: Recent Labs  Lab 03/10/20 1312 03/11/20 0404 03/12/20 0532 03/13/20 0506 03/14/20 0600  NA 144  144 146* 147* 143 141  K 3.5  3.5 3.7 3.2* 3.2* 3.3*  CL 109  108 109 103 97* 99  CO2 '26 26 30 31 30  ' GLUCOSE 153*  151* 104* 95 111* 127*  BUN '20  19 19 17 15 16  ' CREATININE 1.71*  1.80* 1.67* 1.70* 1.65* 1.53*  CALCIUM 8.9 8.9 9.2 9.6 9.5  MG  2.1  --  1.9 1.8 1.9   Liver Function Tests: Recent Labs  Lab 03/10/20 1312 03/11/20 0404  AST 13* 11*  ALT 12 11  ALKPHOS 149* 126  BILITOT 0.3 0.7  PROT 6.8 6.4*  ALBUMIN 3.1* 3.0*   No results for input(s): LIPASE, AMYLASE in the last 168 hours. No results for input(s): AMMONIA in the last 168 hours. CBC: Recent Labs  Lab 03/10/20 1312 03/11/20 0404 03/12/20 0532 03/13/20 0506 03/14/20 0600  WBC 5.3 5.8 4.7 4.9 4.9  NEUTROABS 3.4  --   --   --   --   HGB 10.9*  10.1* 9.4* 10.2* 11.1* 11.4*  HCT 32.0*  33.9* 32.5* 34.7* 37.2 37.9  MCV 89.0 90.8 91.6 87.5 87.7  PLT 242 247 209 255 236  Cardiac Enzymes: No results for input(s): CKTOTAL, CKMB, CKMBINDEX, TROPONINI in the last 168 hours. CBG (last 3)  Recent Labs    03/13/20 2208 03/14/20 0448 03/14/20 0755  GLUCAP 128* 117* 121*   Recent Results (from the past 240 hour(s))  Urine culture     Status: Abnormal   Collection Time: 03/10/20  3:38 PM   Specimen: Urine, Random  Result Value Ref Range Status   Specimen Description   Final    URINE, RANDOM Performed at [facility], [address] [place], [place] [address]    Special Requests   Final    NONE Performed at [facility], [address], [place], [place] [address]    Culture >=100,000 COLONIES/mL Su Hoff (A)  Final   Report Status 03/12/2020 FINAL  Final   Organism ID, Bacteria CITROBACTER YOUNGAE (A)  Final      Susceptibility   Citrobacter youngae - MIC*    CEFAZOLIN >=64 RESISTANT Resistant     CEFTRIAXONE >=64 RESISTANT Resistant     CIPROFLOXACIN <=0.25 SENSITIVE Sensitive     GENTAMICIN <=1 SENSITIVE Sensitive     IMIPENEM 1 SENSITIVE Sensitive     NITROFURANTOIN <=16 SENSITIVE Sensitive     TRIMETH/SULFA <=20 SENSITIVE Sensitive     PIP/TAZO 64 INTERMEDIATE Intermediate     * >=100,000 COLONIES/mL CITROBACTER YOUNGAE  Respiratory Panel by RT PCR (Flu A&B, Covid) - Nasopharyngeal Swab     Status: None   Collection Time:  03/10/20  3:39 PM   Specimen: Nasopharyngeal Swab  Result Value Ref Range Status   SARS Coronavirus 2 by RT PCR NEGATIVE NEGATIVE Final    Comment: (NOTE) SARS-CoV-2 target nucleic acids are NOT DETECTED. The SARS-CoV-2 RNA is generally detectable in upper respiratoy specimens during the acute phase of infection. The lowest concentration of SARS-CoV-2 viral copies this assay can detect is 131 copies/mL. A negative result does not preclude SARS-Cov-2 infection and should not be used as the sole basis for treatment or other patient management decisions. A negative result may occur with  improper specimen collection/handling, submission of specimen other than nasopharyngeal swab, presence of viral mutation(s) within the areas targeted by this assay, and inadequate number of viral copies (<131 copies/mL). A negative result must be combined with clinical observations, patient history, and epidemiological information. The expected result is Negative. Fact Sheet for Patients:  PinkCheek.be Fact Sheet for Healthcare Providers:  GravelBags.it This test is not yet ap proved or cleared by the [place] FDA and  has been authorized for detection and/or diagnosis of SARS-CoV-2 by FDA under an Emergency Use Authorization (EUA). This EUA will remain  in effect (meaning this test can be used) for the duration of the COVID-19 declaration under Section 564(b)(1) of the Act, 21 U.S.C. section 360bbb-3(b)(1), unless the authorization is terminated or revoked sooner.    Influenza A by PCR NEGATIVE NEGATIVE Final   Influenza B by PCR NEGATIVE NEGATIVE Final    Comment: (NOTE) The Xpert Xpress SARS-CoV-2/FLU/RSV assay is intended as an aid in  the diagnosis of influenza from Nasopharyngeal swab specimens and  should not be used as a sole basis for treatment. Nasal washings and  aspirates are unacceptable for Xpert Xpress SARS-CoV-2/FLU/RSV   testing. Fact Sheet for Patients: PinkCheek.be Fact Sheet for Healthcare Providers: GravelBags.it This test is not yet approved or cleared by the [place] FDA and  has been authorized for detection and/or diagnosis of SARS-CoV-2 by  FDA under an Emergency Use Authorization (EUA). This  EUA will remain  in effect (meaning this test can be used) for the duration of the  Covid-19 declaration under Section 564(b)(1) of the Act, 21  U.S.C. section 360bbb-3(b)(1), unless the authorization is  terminated or revoked. Performed at [facility], [address] [place], [place] [address]   SARS CORONAVIRUS 2 (TAT 6-24 HRS) Nasopharyngeal Nasopharyngeal Swab     Status: None   Collection Time: 03/10/20  4:19 PM   Specimen: Nasopharyngeal Swab  Result Value Ref Range Status   SARS Coronavirus 2 NEGATIVE NEGATIVE Final    Comment: (NOTE) SARS-CoV-2 target nucleic acids are NOT DETECTED. The SARS-CoV-2 RNA is generally detectable in upper and lower respiratory specimens during the acute phase of infection. Negative results do not preclude SARS-CoV-2 infection, do not rule out co-infections with other pathogens, and should not be used as the sole basis for treatment or other patient management decisions. Negative results must be combined with clinical observations, patient history, and epidemiological information. The expected result is Negative. Fact Sheet for Patients: SugarRoll.be Fact Sheet for Healthcare Providers: https://www.woods-mathews.com/ This test is not yet approved or cleared by the [place] FDA and  has been authorized for detection and/or diagnosis of SARS-CoV-2 by FDA under an Emergency Use Authorization (EUA). This EUA will remain  in effect (meaning this test can be used) for the duration of the COVID-19 declaration under Section 56 4(b)(1) of the Act, 21  U.S.C. section 360bbb-3(b)(1), unless the authorization is terminated or revoked sooner. Performed at [facility], [place] [address], [place], [place] [address]   MRSA PCR Screening     Status: Abnormal   Collection Time: 03/10/20  6:18 PM   Specimen: Nasal Mucosa; Nasopharyngeal  Result Value Ref Range Status   MRSA by PCR POSITIVE (A) NEGATIVE Final    Comment:        The GeneXpert MRSA Assay (FDA approved for NASAL specimens only), is one component of a comprehensive MRSA colonization surveillance program. It is not intended to diagnose MRSA infection nor to guide or monitor treatment for MRSA infections. RESULT CALLED TO, READ BACK BY AND VERIFIED WITH: Kirtland Bouchard '@[id number]'  03/11/20 [facility] Performed at [facility], [address], [place], [place] [address]      Studies: DG Swallowing Func-Speech Pathology  Result Date: 03/13/2020 Objective Swallowing Evaluation: Type of Study: MBS-Modified Barium Swallow Study  Patient Details Name: [name] MRN: [id number] Date of Birth: 10-07-1936 Today's Date: 03/13/2020 Time: SLP Start Time (ACUTE ONLY): 0093 -SLP Stop Time (ACUTE ONLY): 1654 SLP Time Calculation (min) (ACUTE ONLY): 19 min Past Medical History: Past Medical History: Diagnosis Date . Asthma  . Bronchitis  . Carotid artery disease ([place])  . Coronary atherosclerosis of native coronary artery   Nonobstructive at cath 2005 . DM2 (diabetes mellitus, type 2) ([place])  . Essential hypertension  . Gout  . History of stroke  . Hypothyroidism  . Influenza 2013 . Nonischemic cardiomyopathy (HCC)   LVEF 20-25% . PAF (paroxysmal atrial fibrillation) ([place])   a. diagnosed in 04/2018. Started on Eliquis for anticoagulation.  . Renal artery stenosis (HCC)   Bilateral Past Surgical History: Past Surgical History: Procedure Laterality Date . ABDOMINAL HYSTERECTOMY   . CATARACT EXTRACTION W/PHACO Left 01/18/2019  Procedure: CATARACT EXTRACTION PHACO AND INTRAOCULAR LENS PLACEMENT ([place]);  Surgeon:  Baruch Goldmann, MD;  Location: [facility];  Service: Ophthalmology;  Laterality: Left;  CDE: 11.99 . CATARACT EXTRACTION W/PHACO Right 02/26/2019  Procedure: CATARACT EXTRACTION PHACO AND INTRAOCULAR LENS PLACEMENT  RIGHT EYE (CDE: 13.46);  Surgeon: Baruch Goldmann, MD;  Location: [facility];  Service: Ophthalmology;  Laterality: Right; HPI: 84 y.o. female  with past medical history of stroke, dementia, CAD, combined systolic/diastolic CHF (EF 09-32%), 2L oxygen at home, atrial fibrillation on Eliquis, diabetes, HTN, CKD stage 3b. Hospitalization 12/26/19-01/01/20 with UTI and aspiration pneumonia and concern for possible seizure. Admitted on 03/10/2020 with slurred speech and left facial droop with worsening weakness left side worse than right. CT head with no evidence of stroke. Treating for UTI and probable pneumonia as well as pulmonary edema. Head CT negative for acute abnormality, shows stable chronic encephalomalacia involving the right frontal white matter, medial left occipital lobe and posterior left cerebellum, moderate generalized atrophy, stable remote lacunar basal ganglia infarcts. MRI motion degraded but shows: Restricted diffusion in the deep white matter on the right compatible with acute infarction. Portable chest x-ray showed cardiomegaly with pulmonary vascular congestion and pulmonary edema, small bilateral pleural effusions, consolidation versus atelectasis at the left lung base. Pt was seen by SLP during January admission for BSE (advanced to D3/thin by discharge). BSE completed earlier this week and NPO was recommended; ongoing diagnostic dysphagia therapy today recommends MBS to objectively assess the swallow function and to determine readiness to initiate a diet.  Subjective: "No!" Assessment / Plan / Recommendation CHL IP CLINICAL IMPRESSIONS 03/13/2020 Clinical Impression Pt presents with moderate oral phase dysphagia and very mild pharyngeal phase dysphagia; no aspiration was observed. Oral phase is  characterized by anterior loss of bolus on the L, reduced posterior propulsion of bolus, lingual pumping, premature spillage to the level of the pyriforms and mild oral residue after the swallow. Pharyngeal phase is characterized by good hyolaryngeal excursion, consistent laryngeal vestibule closure and adequate airway protection. Slightly decreased pharyngeal squeeze results in mild pharyngeal residue in the valleculae and pyriforms that is cleared when Pt produces an additional dry swallow (Pt requires verbal cues to produce this additional swallow). Pt was unable to thoroughly masticate solid textures which were eventually manually removed from oral cavity; puree textures were consumed with prolonged AP transit.  Pt demonstrates increased oral control of thin liquids when she self feeds cup sips; she does require hand over hand assist, however. Note brief stasis of the barium tablet in the valleculae that easily passed through when Pt was provided a tsp of puree. Recommend initiate D1/puree diet and thin liquids. Pt requires 1:1 assist and recommend strategies: alternate small bites and sips, Pt to self feed cup sips (feeder provide hand over hand), Pt to produce an additional dry swallow after each bite or sip, after meals check L sulcus for residue. Meds to be adminsitered whole in applesauce. SLP reviewed recommendations and MBS results with Pt's daughter. Recommend HH ST to f/u upon d/c home.  SLP Visit Diagnosis Dysphagia, oropharyngeal phase (R13.12) Attention and concentration deficit following -- Frontal lobe and executive function deficit following -- Impact on safety and function Mild aspiration risk;Moderate aspiration risk   CHL IP TREATMENT RECOMMENDATION 03/13/2020 Treatment Recommendations Therapy as outlined in treatment plan below   Prognosis 03/13/2020 Prognosis for Safe Diet Advancement Good Barriers to Reach Goals -- Barriers/Prognosis Comment -- CHL IP DIET RECOMMENDATION 03/13/2020 SLP Diet  Recommendations Dysphagia 1 (Puree) solids;Thin liquid Liquid Administration via Cup Medication Administration Whole meds with puree Compensations Minimize environmental distractions;Slow rate;Small sips/bites;Multiple dry swallows after each bite/sip;Monitor for anterior loss;Lingual sweep for clearance of pocketing;Follow solids with liquid Postural Changes Remain semi-upright after after feeds/meals (Comment);Seated upright at 90  degrees   CHL IP OTHER RECOMMENDATIONS 03/13/2020 Recommended Consults -- Oral Care Recommendations Oral care BID Other Recommendations --   CHL IP FOLLOW UP RECOMMENDATIONS 03/13/2020 Follow up Recommendations 24 hour supervision/assistance;Skilled Nursing facility   [facility] IP FREQUENCY AND DURATION 03/13/2020 Speech Therapy Frequency (ACUTE ONLY) min 1 x/week Treatment Duration 1 week      CHL IP ORAL PHASE 03/13/2020 Oral Phase Impaired Oral - Pudding Teaspoon -- Oral - Pudding Cup -- Oral - Honey Teaspoon -- Oral - Honey Cup -- Oral - Nectar Teaspoon -- Oral - Nectar Cup Left anterior bolus loss;Reduced posterior propulsion;Lingual pumping Oral - Nectar Straw -- Oral - Thin Teaspoon Left anterior bolus loss;Reduced posterior propulsion;Lingual pumping Oral - Thin Cup Left anterior bolus loss;Reduced posterior propulsion;Lingual pumping Oral - Thin Straw Left anterior bolus loss;Reduced posterior propulsion;Lingual pumping Oral - Puree Reduced posterior propulsion Oral - Mech Soft -- Oral - Regular Reduced posterior propulsion Oral - Multi-Consistency -- Oral - Pill -- Oral Phase - Comment --  CHL IP PHARYNGEAL PHASE 03/13/2020 Pharyngeal Phase Impaired Pharyngeal- Pudding Teaspoon -- Pharyngeal -- Pharyngeal- Pudding Cup -- Pharyngeal -- Pharyngeal- Honey Teaspoon -- Pharyngeal -- Pharyngeal- Honey Cup -- Pharyngeal -- Pharyngeal- Nectar Teaspoon -- Pharyngeal -- Pharyngeal- Nectar Cup Pharyngeal residue - valleculae;Pharyngeal residue - pyriform;Reduced pharyngeal peristalsis Pharyngeal --  Pharyngeal- Nectar Straw -- Pharyngeal -- Pharyngeal- Thin Teaspoon Pharyngeal residue - valleculae;Pharyngeal residue - pyriform;Reduced pharyngeal peristalsis Pharyngeal -- Pharyngeal- Thin Cup Pharyngeal residue - valleculae;Pharyngeal residue - pyriform;Reduced pharyngeal peristalsis Pharyngeal -- Pharyngeal- Thin Straw Pharyngeal residue - valleculae;Pharyngeal residue - pyriform;Reduced pharyngeal peristalsis Pharyngeal -- Pharyngeal- Puree -- Pharyngeal -- Pharyngeal- Mechanical Soft -- Pharyngeal -- Pharyngeal- Regular Pharyngeal residue - valleculae;Pharyngeal residue - pyriform;Reduced pharyngeal peristalsis Pharyngeal -- Pharyngeal- Multi-consistency -- Pharyngeal -- Pharyngeal- Pill Pharyngeal residue - valleculae;Pharyngeal residue - pyriform;Reduced pharyngeal peristalsis Pharyngeal -- Pharyngeal Comment --  CHL IP CERVICAL ESOPHAGEAL PHASE 03/13/2020 Cervical Esophageal Phase WFL Pudding Teaspoon -- Pudding Cup -- Honey Teaspoon -- Honey Cup -- Nectar Teaspoon -- Nectar Cup -- Nectar Straw -- Thin Teaspoon -- Thin Cup -- Thin Straw -- Puree -- Mechanical Soft -- Regular -- Multi-consistency -- Pill -- Cervical Esophageal Comment -- Amelia H. Roddie Mc, CCC-SLP Speech Language Pathologist Wende Bushy 03/13/2020, 9:39 PM              EEG adult  Result Date: 03/13/2020 Lora Havens, MD     03/13/2020  3:28 PM Patient Name: [name] MRN: [id number] Epilepsy Attending: Lora Havens Referring Physician/Provider: Dr Jenetta Downer Date: 03/13/2020 Duration: 24.42 mins Patient history: 84 year old female with history of seizures and acute infarct.  EEG evaluate for seizures. Level of alertness: awake AEDs during EEG study: Keppra Technical aspects: This EEG study was done with scalp electrodes positioned according to the 10-20 International system of electrode placement. Electrical activity was acquired at a sampling rate of '500Hz'  and reviewed with a high frequency filter of '70Hz'  and a  low frequency filter of '1Hz' . EEG data were recorded continuously and digitally stored. DESCRIPTION: No clear posterior dominant rhythm consists of 9-10 Hz activity of moderate voltage (25-35 uV) was seen. EEG showed continuous generalized 3-'5Hz'  theta-delta slowing. Hyperventilation and photic stimulation were not performed. ABNORMALITY - Continuous slow, generalized IMPRESSION: This study is suggestive of mild to moderate diffuse encephalopathy, non specific to etiology. No seizures or epileptiform discharges were seen throughout the recording. Priyanka Barbra Sarks   Scheduled Meds: . apixaban  2.5 mg Oral BID  .  aspirin  81 mg Oral Daily  . Chlorhexidine Gluconate Cloth  6 each Topical Q0600  . insulin aspart  0-9 Units Subcutaneous TID WC  . memantine  5 mg Oral BID  . metoprolol tartrate  2.5 mg Intravenous Q6H  . mupirocin ointment  1 application Nasal BID   Continuous Infusions: . feeding supplement (OSMOLITE 1.2 CAL) Stopped (03/13/20 1405)  . levETIRAcetam Stopped (03/14/20 0340)    Principal Problem:   Acute metabolic encephalopathy Active Problems:   Type 2 diabetes mellitus with hyperlipidemia (HCC)   Essential hypertension   CVA (cerebral vascular accident) ([place])   Acute on chronic combined systolic and diastolic CHF (congestive heart failure) (HCC)   Right bundle branch block   Atrial fibrillation with RVR (HCC)   Atrial fibrillation, chronic (HCC)   Left-sided weakness   Goals of care, counseling/discussion   Palliative care by specialist  Time spent:   Irwin Brakeman, MD [facility] 03/14/2020, 11:23 AM    LOS: 4 days  How to contact the [name] Attending or Consulting provider [place] or covering provider during after hours [place], for this patient?  1. Check the care team in [facility] and look for a) attending/consulting [facility] provider listed and b) the [facility] team listed 2. Log into [email or web address] and use [place]'s universal password to access. If you do not have the  password, please contact the hospital operator. 3. Locate the [facility] provider you are looking for under [facility] and page to a number that you can be directly reached. 4. If you still have difficulty reaching the provider, please page the [facility] (Director on Call) for the Hospitalists listed on amion for assistance.

## 2020-03-15 LAB — BASIC METABOLIC PANEL
Anion gap: 13 (ref 5–15)
BUN: 19 mg/dL (ref 8–23)
CO2: 29 mmol/L (ref 22–32)
Calcium: 9.7 mg/dL (ref 8.9–10.3)
Chloride: 103 mmol/L (ref 98–111)
Creatinine, Ser: 1.75 mg/dL — ABNORMAL HIGH (ref 0.44–1.00)
GFR calc Af Amer: 31 mL/min — ABNORMAL LOW (ref >60)
GFR calc non Af Amer: 27 mL/min — ABNORMAL LOW (ref >60)
Glucose, Bld: 104 mg/dL — ABNORMAL HIGH (ref 70–99)
Potassium: 3.5 mmol/L (ref 3.5–5.1)
Sodium: 145 mmol/L (ref 135–145)

## 2020-03-15 LAB — CBC
HCT: 40.3 % (ref 36.0–46.0)
Hemoglobin: 11.7 g/dL — ABNORMAL LOW (ref 12.0–15.0)
MCH: 26 pg (ref 26.0–34.0)
MCHC: 29 g/dL — ABNORMAL LOW (ref 30.0–36.0)
MCV: 89.6 fL (ref 80.0–100.0)
Platelets: 241 10*3/uL (ref 150–400)
RBC: 4.5 MIL/uL (ref 3.87–5.11)
RDW: 14.6 % (ref 11.5–15.5)
WBC: 5.9 10*3/uL (ref 4.0–10.5)
nRBC: 0 % (ref 0.0–0.2)

## 2020-03-15 LAB — GLUCOSE, CAPILLARY
Glucose-Capillary: 113 mg/dL — ABNORMAL HIGH (ref 70–99)
Glucose-Capillary: 115 mg/dL — ABNORMAL HIGH (ref 70–99)
Glucose-Capillary: 124 mg/dL — ABNORMAL HIGH (ref 70–99)
Glucose-Capillary: 142 mg/dL — ABNORMAL HIGH (ref 70–99)
Glucose-Capillary: 150 mg/dL — ABNORMAL HIGH (ref 70–99)
Glucose-Capillary: 91 mg/dL (ref 70–99)

## 2020-03-15 LAB — MAGNESIUM: Magnesium: 2 mg/dL (ref 1.7–2.4)

## 2020-03-15 MED ORDER — POTASSIUM CHLORIDE IN NACL 20-0.45 MEQ/L-% IV SOLN: INTRAVENOUS | Status: AC

## 2020-03-15 MED ORDER — SODIUM CHLORIDE 0.45 % IV SOLN: INTRAVENOUS | Status: DC

## 2020-03-15 NOTE — Progress Notes (Signed)
PROGRESS NOTE [facility] [name]  PJA:[id number]  DOB: [birth year]  DOA: 03/10/2020 PCP: Jani Gravel, MD   Brief Admission Hx: 84 year old female with chronic atrial fibrillation, diabetes mellitus type 2, hypertension, biventricular heart failure, cerebrovascular disease and coronary artery disease presents with sudden onset of left-sided weakness.  After speaking with her son who is her primary caretaker he tells me that there has been a problem with her medications as she had missed some doses of medications when she was with other family members but he notes that he usually makes sure that she has not missed any doses.  She presents markedly volume overloaded with pulmonary edema and I also worry that she missed some of her Eliquis doses which may have precipitated the CVA.   MDM/Assessment & Plan:   1. Acute embolic CVA-repeated MRI with light sedation confirms acute infarction.   Unfortunately patient continues to have significant dysphagia but had barium swallow on 4/2 and SLP have pla. I spoke with daughter and son.   She will have to be n.p.o. for now as she is not safe for swallowing according to SLP.  I received a call back from the son who reports he met with family members and they have all agreed that they want to have an NG tube placed to start tube feeding.  They understand that the NG tube is a temporary measure and that she may require PEG placement if her swallow function does not improve.   PT/OT/SLP eval recommending SNF.  Pt likely will need SNF placement however after speaking with son, he may not be agreeable, will address daily with family.    Neurology has seen and recommended ASA 325 mg and consider switching to xarelto however due to her CKD it may me reasonable to stay on apixaban.  I expressed to family that patient is not doing well and likely would not tolerate a feeding tube as she is very agitated and confused and does not tolerate much interaction.    2. Acute delirium-with patient's vascular dementia she continues to sundown in the evening hours.  She has been medicated with haloperidol for severe agitation symptoms.  Delirium precautions.  If continues can consider scheduled Seroquel. 3. Dysphagia - Fortunately after MBS done 4/2 patient is now able to have a dysphagia diet and we were able to avoid placing NG tube.     4. Chronic atrial fibrillation - rate currently controlled, she had been anticoagulated with apixaban but neuro recommending consider switching to xarelto.  I do not consider patient's case to be of failure of apixaban as she was not receiving it regularly as told to me by family members. 5. Hypertension-blood pressures elevated, we allowed for permissive hypertension in the setting of the acute CVA and now after 48 hours we will slowly start to lower blood pressure to more normal levels.  Starting coreg 6.25 mg BID, amlodipine 5 mg daily.    6. History of remote seizure activity-she had been started on Keppra by neurology earlier this year as empiric treatment for suspected unwitnessed seizures.  Keppra IV ordered for now.  Hopefully can transition to oral Keppra after the NG tube has been placed. 7. HFrEF - Pt was initially very volume overloaded and I suspect has not been receiving lasix as prescribed as family members have told me she had missed doses of medications.  She had findings of pulmonary edema on chest xray.  She was diuresed with IV lasix.  IV Lasix has  been discontinued. 8. CKD stage 3 - Monitoring creatinine closely. 9. Type 2 DM - had been controlled on Actos, Januvia,- Would rethink starting her back on actos with her known biventricular CHF, hold januvia while in hospital. Monitor CBGs.  SSI as needed.  10. Chronic respiratory failure - continue supplemental oxygen.  11. Gout - resume allopurinol when able.   12. Hypernatremia -resolved after receiving free water.  Recheck in AM.  13. Hypokalemia -oral  replacement, mag is WNL.   DVT prophylaxis: heparin  Code Status: Full  Family Communication: son [name] and daughter [name] updated by telephone, there are complex family dynamics involved.  I spoke with [name] at the bedside 03/13/20.   Disposition Plan: family still resistant to SNF placement, patient likely to go [facility] with [facility] and 24/7 supervision.    Consultants:  Neurology  Palliative   Procedures:    Antimicrobials:     Subjective: Pt continues sundowning at night, but she is eating better now.  Tolerating dysphagia diet.  Dense left hemiparesis remains mostly unchanged.  Patient gets agitated especially in the evening hours.  Objective: Vitals:   03/14/20 1757 03/14/20 2127 03/15/20 0519 03/15/20 0934  BP: (!) 152/64 (!) 156/81 (!) 142/86 (!) 157/86  Pulse: 84 80 75 72  Resp:  18 18   Temp:  98.9 F (37.2 C) 98 F (36.7 C)   TempSrc:  Oral Oral   SpO2:  99% 99%   Weight:   77.4 kg   Height:        Intake/Output Summary (Last 24 hours) at 03/15/2020 1155 Last data filed at 03/15/2020 0900 Gross per 24 hour  Intake 246 ml  Output 250 ml  Net -4 ml   Filed Weights   03/12/20 0500 03/14/20 0350 03/15/20 0519  Weight: 79.3 kg 78.1 kg 77.4 kg   REVIEW OF SYSTEMS  UTO due to current condition   Exam:  General exam: elderly frail female, awake, somnolent, NAD.  Notes left facial droop and tongue protruding to the left.  Left eye closed.  Speech is minimally improved today. Respiratory system: No crackles or rails heard today.  No increased work of breathing. Cardiovascular system: irregularly irregular, S1 & S2 heard. Mild JVD, 1+ pedal edema. Gastrointestinal system: Abdomen is nondistended, soft and nontender. Normal bowel sounds heard. Central nervous system: somnolent, left facial droop, tongue protrudes to the left.  Pronounced dense left hemiparesis unchanged. Extremities: Trace pretibial edema BLEs.   Data Reviewed: Basic Metabolic Panel: Recent Labs   Lab 03/10/20 1312 03/10/20 1312 03/11/20 0404 03/12/20 0532 03/13/20 0506 03/14/20 0600 03/15/20 0604  NA 144  144   < > 146* 147* 143 141 145  K 3.5  3.5   < > 3.7 3.2* 3.2* 3.3* 3.5  CL 109  108   < > 109 103 97* 99 103  CO2 26   < > '26 30 31 30 29  ' GLUCOSE 153*  151*   < > 104* 95 111* 127* 104*  BUN 20  19   < > '19 17 15 16 19  ' CREATININE 1.71*  1.80*   < > 1.67* 1.70* 1.65* 1.53* 1.75*  CALCIUM 8.9   < > 8.9 9.2 9.6 9.5 9.7  MG 2.1  --   --  1.9 1.8 1.9 2.0   < > = values in this interval not displayed.   Liver Function Tests: Recent Labs  Lab 03/10/20 1312 03/11/20 0404  AST 13* 11*  ALT 12 11  ALKPHOS 149* 126  BILITOT 0.3 0.7  PROT 6.8 6.4*  ALBUMIN 3.1* 3.0*   No results for input(s): LIPASE, AMYLASE in the last 168 hours. No results for input(s): AMMONIA in the last 168 hours. CBC: Recent Labs  Lab 03/10/20 1312 03/10/20 1312 03/11/20 0404 03/12/20 0532 03/13/20 0506 03/14/20 0600 03/15/20 0604  WBC 5.3   < > 5.8 4.7 4.9 4.9 5.9  NEUTROABS 3.4  --   --   --   --   --   --   HGB 10.9*  10.1*   < > 9.4* 10.2* 11.1* 11.4* 11.7*  HCT 32.0*  33.9*   < > 32.5* 34.7* 37.2 37.9 40.3  MCV 89.0   < > 90.8 91.6 87.5 87.7 89.6  PLT 242   < > 247 209 255 236 241   < > = values in this interval not displayed.   Cardiac Enzymes: No results for input(s): CKTOTAL, CKMB, CKMBINDEX, TROPONINI in the last 168 hours. CBG (last 3)  Recent Labs    03/15/20 0359 03/15/20 0716 03/15/20 1120  GLUCAP 113* 91 115*   Recent Results (from the past 240 hour(s))  Urine culture     Status: Abnormal   Collection Time: 03/10/20  3:38 PM   Specimen: Urine, Random  Result Value Ref Range Status   Specimen Description   Final    URINE, RANDOM Performed at [facility], [address], [place], [place] [address]    Special Requests   Final    NONE Performed at [facility], [address] [place], [place] [address]    Culture >=100,000 COLONIES/mL Su Hoff (A)  Final   Report Status 03/12/2020 FINAL  Final   Organism ID, Bacteria CITROBACTER YOUNGAE (A)  Final      Susceptibility   Citrobacter youngae - MIC*    CEFAZOLIN >=64 RESISTANT Resistant     CEFTRIAXONE >=64 RESISTANT Resistant     CIPROFLOXACIN <=0.25 SENSITIVE Sensitive     GENTAMICIN <=1 SENSITIVE Sensitive     IMIPENEM 1 SENSITIVE Sensitive     NITROFURANTOIN <=16 SENSITIVE Sensitive     TRIMETH/SULFA <=20 SENSITIVE Sensitive     PIP/TAZO 64 INTERMEDIATE Intermediate     * >=100,000 COLONIES/mL CITROBACTER YOUNGAE  Respiratory Panel by RT PCR (Flu A&B, Covid) - Nasopharyngeal Swab     Status: None   Collection Time: 03/10/20  3:39 PM   Specimen: Nasopharyngeal Swab  Result Value Ref Range Status   SARS Coronavirus 2 by RT PCR NEGATIVE NEGATIVE Final    Comment: (NOTE) SARS-CoV-2 target nucleic acids are NOT DETECTED. The SARS-CoV-2 RNA is generally detectable in upper respiratoy specimens during the acute phase of infection. The lowest concentration of SARS-CoV-2 viral copies this assay can detect is 131 copies/mL. A negative result does not preclude SARS-Cov-2 infection and should not be used as the sole basis for treatment or other patient management decisions. A negative result may occur with  improper specimen collection/handling, submission of specimen other than nasopharyngeal swab, presence of viral mutation(s) within the areas targeted by this assay, and inadequate number of viral copies (<131 copies/mL). A negative result must be combined with clinical observations, patient history, and epidemiological information. The expected result is Negative. Fact Sheet for Patients:  PinkCheek.be Fact Sheet for Healthcare Providers:  GravelBags.it This test is not yet ap proved or cleared by the [place] FDA and  has been authorized for detection and/or diagnosis of SARS-CoV-2 by FDA under an  Emergency  Use Authorization (EUA). This EUA will remain  in effect (meaning this test can be used) for the duration of the COVID-19 declaration under Section 564(b)(1) of the Act, 21 U.S.C. section 360bbb-3(b)(1), unless the authorization is terminated or revoked sooner.    Influenza A by PCR NEGATIVE NEGATIVE Final   Influenza B by PCR NEGATIVE NEGATIVE Final    Comment: (NOTE) The Xpert Xpress SARS-CoV-2/FLU/RSV assay is intended as an aid in  the diagnosis of influenza from Nasopharyngeal swab specimens and  should not be used as a sole basis for treatment. Nasal washings and  aspirates are unacceptable for Xpert Xpress SARS-CoV-2/FLU/RSV  testing. Fact Sheet for Patients: PinkCheek.be Fact Sheet for Healthcare Providers: GravelBags.it This test is not yet approved or cleared by the [place] FDA and  has been authorized for detection and/or diagnosis of SARS-CoV-2 by  FDA under an Emergency Use Authorization (EUA). This EUA will remain  in effect (meaning this test can be used) for the duration of the  Covid-19 declaration under Section 564(b)(1) of the Act, 21  U.S.C. section 360bbb-3(b)(1), unless the authorization is  terminated or revoked. Performed at [facility], [address], [place], [place] [address]   SARS CORONAVIRUS 2 (TAT 6-24 HRS) Nasopharyngeal Nasopharyngeal Swab     Status: None   Collection Time: 03/10/20  4:19 PM   Specimen: Nasopharyngeal Swab  Result Value Ref Range Status   SARS Coronavirus 2 NEGATIVE NEGATIVE Final    Comment: (NOTE) SARS-CoV-2 target nucleic acids are NOT DETECTED. The SARS-CoV-2 RNA is generally detectable in upper and lower respiratory specimens during the acute phase of infection. Negative results do not preclude SARS-CoV-2 infection, do not rule out co-infections with other pathogens, and should not be used as the sole basis for treatment or other patient management  decisions. Negative results must be combined with clinical observations, patient history, and epidemiological information. The expected result is Negative. Fact Sheet for Patients: SugarRoll.be Fact Sheet for Healthcare Providers: https://www.woods-mathews.com/ This test is not yet approved or cleared by the [place] FDA and  has been authorized for detection and/or diagnosis of SARS-CoV-2 by FDA under an Emergency Use Authorization (EUA). This EUA will remain  in effect (meaning this test can be used) for the duration of the COVID-19 declaration under Section 56 4(b)(1) of the Act, 21 U.S.C. section 360bbb-3(b)(1), unless the authorization is terminated or revoked sooner. Performed at [facility], [place] [address], [place], [place] [address]   MRSA PCR Screening     Status: Abnormal   Collection Time: 03/10/20  6:18 PM   Specimen: Nasal Mucosa; Nasopharyngeal  Result Value Ref Range Status   MRSA by PCR POSITIVE (A) NEGATIVE Final    Comment:        The GeneXpert MRSA Assay (FDA approved for NASAL specimens only), is one component of a comprehensive MRSA colonization surveillance program. It is not intended to diagnose MRSA infection nor to guide or monitor treatment for MRSA infections. RESULT CALLED TO, READ BACK BY AND VERIFIED WITH: Kirtland Bouchard '@[id number]'  03/11/20 [facility] Performed at [facility], [address], [place],  [address]      Studies: DG Swallowing Func-Speech Pathology  Result Date: 03/13/2020 Objective Swallowing Evaluation: Type of Study: MBS-Modified Barium Swallow Study  Patient Details Name: [name] MRN: [id number] Date of Birth: 01-13-1936 Today's Date: 03/13/2020 Time: SLP Start Time (ACUTE ONLY): 5916 -SLP Stop Time (ACUTE ONLY): 1654 SLP Time Calculation (min) (ACUTE ONLY): 19 min Past Medical  History: Past Medical History: Diagnosis Date . Asthma  . Bronchitis  . Carotid artery disease ([place])   . Coronary atherosclerosis of native coronary artery   Nonobstructive at cath 2005 . DM2 (diabetes mellitus, type 2) ([place])  . Essential hypertension  . Gout  . History of stroke  . Hypothyroidism  . Influenza 2013 . Nonischemic cardiomyopathy (HCC)   LVEF 20-25% . PAF (paroxysmal atrial fibrillation) ([place])   a. diagnosed in 04/2018. Started on Eliquis for anticoagulation.  . Renal artery stenosis (HCC)   Bilateral Past Surgical History: Past Surgical History: Procedure Laterality Date . ABDOMINAL HYSTERECTOMY   . CATARACT EXTRACTION W/PHACO Left 01/18/2019  Procedure: CATARACT EXTRACTION PHACO AND INTRAOCULAR LENS PLACEMENT ([place]);  Surgeon: Baruch Goldmann, MD;  Location: [facility];  Service: Ophthalmology;  Laterality: Left;  CDE: 11.99 . CATARACT EXTRACTION W/PHACO Right 02/26/2019  Procedure: CATARACT EXTRACTION PHACO AND INTRAOCULAR LENS PLACEMENT RIGHT EYE (CDE: 13.46);  Surgeon: Baruch Goldmann, MD;  Location: [facility];  Service: Ophthalmology;  Laterality: Right; HPI: 84 y.o. female  with past medical history of stroke, dementia, CAD, combined systolic/diastolic CHF (EF 21-97%), 2L oxygen at home, atrial fibrillation on Eliquis, diabetes, HTN, CKD stage 3b. Hospitalization 12/26/19-01/01/20 with UTI and aspiration pneumonia and concern for possible seizure. Admitted on 03/10/2020 with slurred speech and left facial droop with worsening weakness left side worse than right. CT head with no evidence of stroke. Treating for UTI and probable pneumonia as well as pulmonary edema. Head CT negative for acute abnormality, shows stable chronic encephalomalacia involving the right frontal white matter, medial left occipital lobe and posterior left cerebellum, moderate generalized atrophy, stable remote lacunar basal ganglia infarcts. MRI motion degraded but shows: Restricted diffusion in the deep white matter on the right compatible with acute infarction. Portable chest x-ray showed cardiomegaly with pulmonary vascular congestion  and pulmonary edema, small bilateral pleural effusions, consolidation versus atelectasis at the left lung base. Pt was seen by SLP during January admission for BSE (advanced to D3/thin by discharge). BSE completed earlier this week and NPO was recommended; ongoing diagnostic dysphagia therapy today recommends MBS to objectively assess the swallow function and to determine readiness to initiate a diet.  Subjective: "No!" Assessment / Plan / Recommendation CHL IP CLINICAL IMPRESSIONS 03/13/2020 Clinical Impression Pt presents with moderate oral phase dysphagia and very mild pharyngeal phase dysphagia; no aspiration was observed. Oral phase is characterized by anterior loss of bolus on the L, reduced posterior propulsion of bolus, lingual pumping, premature spillage to the level of the pyriforms and mild oral residue after the swallow. Pharyngeal phase is characterized by good hyolaryngeal excursion, consistent laryngeal vestibule closure and adequate airway protection. Slightly decreased pharyngeal squeeze results in mild pharyngeal residue in the valleculae and pyriforms that is cleared when Pt produces an additional dry swallow (Pt requires verbal cues to produce this additional swallow). Pt was unable to thoroughly masticate solid textures which were eventually manually removed from oral cavity; puree textures were consumed with prolonged AP transit.  Pt demonstrates increased oral control of thin liquids when she self feeds cup sips; she does require hand over hand assist, however. Note brief stasis of the barium tablet in the valleculae that easily passed through when Pt was provided a tsp of puree. Recommend initiate D1/puree diet and thin liquids. Pt requires 1:1 assist and recommend strategies: alternate small bites and sips, Pt to self feed cup sips (feeder provide hand over hand), Pt to produce an additional dry swallow after each bite or  sip, after meals check L sulcus for residue. Meds to be adminsitered  whole in applesauce. SLP reviewed recommendations and MBS results with Pt's daughter. Recommend HH ST to f/u upon d/c home.  SLP Visit Diagnosis Dysphagia, oropharyngeal phase (R13.12) Attention and concentration deficit following -- Frontal lobe and executive function deficit following -- Impact on safety and function Mild aspiration risk;Moderate aspiration risk   CHL IP TREATMENT RECOMMENDATION 03/13/2020 Treatment Recommendations Therapy as outlined in treatment plan below   Prognosis 03/13/2020 Prognosis for Safe Diet Advancement Good Barriers to Reach Goals -- Barriers/Prognosis Comment -- CHL IP DIET RECOMMENDATION 03/13/2020 SLP Diet Recommendations Dysphagia 1 (Puree) solids;Thin liquid Liquid Administration via Cup Medication Administration Whole meds with puree Compensations Minimize environmental distractions;Slow rate;Small sips/bites;Multiple dry swallows after each bite/sip;Monitor for anterior loss;Lingual sweep for clearance of pocketing;Follow solids with liquid Postural Changes Remain semi-upright after after feeds/meals (Comment);Seated upright at 90 degrees   CHL IP OTHER RECOMMENDATIONS 03/13/2020 Recommended Consults -- Oral Care Recommendations Oral care BID Other Recommendations --   CHL IP FOLLOW UP RECOMMENDATIONS 03/13/2020 Follow up Recommendations 24 hour supervision/assistance;Skilled Nursing facility   [facility] IP FREQUENCY AND DURATION 03/13/2020 Speech Therapy Frequency (ACUTE ONLY) min 1 x/week Treatment Duration 1 week      CHL IP ORAL PHASE 03/13/2020 Oral Phase Impaired Oral - Pudding Teaspoon -- Oral - Pudding Cup -- Oral - Honey Teaspoon -- Oral - Honey Cup -- Oral - Nectar Teaspoon -- Oral - Nectar Cup Left anterior bolus loss;Reduced posterior propulsion;Lingual pumping Oral - Nectar Straw -- Oral - Thin Teaspoon Left anterior bolus loss;Reduced posterior propulsion;Lingual pumping Oral - Thin Cup Left anterior bolus loss;Reduced posterior propulsion;Lingual pumping Oral - Thin Straw Left  anterior bolus loss;Reduced posterior propulsion;Lingual pumping Oral - Puree Reduced posterior propulsion Oral - Mech Soft -- Oral - Regular Reduced posterior propulsion Oral - Multi-Consistency -- Oral - Pill -- Oral Phase - Comment --  CHL IP PHARYNGEAL PHASE 03/13/2020 Pharyngeal Phase Impaired Pharyngeal- Pudding Teaspoon -- Pharyngeal -- Pharyngeal- Pudding Cup -- Pharyngeal -- Pharyngeal- Honey Teaspoon -- Pharyngeal -- Pharyngeal- Honey Cup -- Pharyngeal -- Pharyngeal- Nectar Teaspoon -- Pharyngeal -- Pharyngeal- Nectar Cup Pharyngeal residue - valleculae;Pharyngeal residue - pyriform;Reduced pharyngeal peristalsis Pharyngeal -- Pharyngeal- Nectar Straw -- Pharyngeal -- Pharyngeal- Thin Teaspoon Pharyngeal residue - valleculae;Pharyngeal residue - pyriform;Reduced pharyngeal peristalsis Pharyngeal -- Pharyngeal- Thin Cup Pharyngeal residue - valleculae;Pharyngeal residue - pyriform;Reduced pharyngeal peristalsis Pharyngeal -- Pharyngeal- Thin Straw Pharyngeal residue - valleculae;Pharyngeal residue - pyriform;Reduced pharyngeal peristalsis Pharyngeal -- Pharyngeal- Puree -- Pharyngeal -- Pharyngeal- Mechanical Soft -- Pharyngeal -- Pharyngeal- Regular Pharyngeal residue - valleculae;Pharyngeal residue - pyriform;Reduced pharyngeal peristalsis Pharyngeal -- Pharyngeal- Multi-consistency -- Pharyngeal -- Pharyngeal- Pill Pharyngeal residue - valleculae;Pharyngeal residue - pyriform;Reduced pharyngeal peristalsis Pharyngeal -- Pharyngeal Comment --  CHL IP CERVICAL ESOPHAGEAL PHASE 03/13/2020 Cervical Esophageal Phase WFL Pudding Teaspoon -- Pudding Cup -- Honey Teaspoon -- Honey Cup -- Nectar Teaspoon -- Nectar Cup -- Nectar Straw -- Thin Teaspoon -- Thin Cup -- Thin Straw -- Puree -- Mechanical Soft -- Regular -- Multi-consistency -- Pill -- Cervical Esophageal Comment -- Amelia H. Roddie Mc, CCC-SLP Speech Language Pathologist Wende Bushy 03/13/2020, 9:39 PM              EEG adult  Result Date:  03/13/2020 Lora Havens, MD     03/13/2020  3:28 PM Patient Name: [name] MRN: [id number] Epilepsy Attending: Lora Havens Referring Physician/Provider: Dr Jenetta Downer Date: 03/13/2020 Duration: 24.42 mins Patient  history: 84 year old female with history of seizures and acute infarct.  EEG evaluate for seizures. Level of alertness: awake AEDs during EEG study: Keppra Technical aspects: This EEG study was done with scalp electrodes positioned according to the 10-20 International system of electrode placement. Electrical activity was acquired at a sampling rate of '500Hz'  and reviewed with a high frequency filter of '70Hz'  and a low frequency filter of '1Hz' . EEG data were recorded continuously and digitally stored. DESCRIPTION: No clear posterior dominant rhythm consists of 9-10 Hz activity of moderate voltage (25-35 uV) was seen. EEG showed continuous generalized 3-'5Hz'  theta-delta slowing. Hyperventilation and photic stimulation were not performed. ABNORMALITY - Continuous slow, generalized IMPRESSION: This study is suggestive of mild to moderate diffuse encephalopathy, non specific to etiology. No seizures or epileptiform discharges were seen throughout the recording. Priyanka Barbra Sarks   Scheduled Meds: . amLODipine  5 mg Oral Daily  . apixaban  2.5 mg Oral BID  . aspirin  81 mg Oral Daily  . carvedilol  6.25 mg Oral BID WC  . Chlorhexidine Gluconate Cloth  6 each Topical Q0600  . insulin aspart  0-9 Units Subcutaneous TID WC  . levETIRAcetam  500 mg Oral BID  . memantine  5 mg Oral BID  . mupirocin ointment  1 application Nasal BID   Continuous Infusions: . 0.45 % NaCl with KCl 20 mEq / L 50 mL/hr at 03/15/20 0922  . feeding supplement (OSMOLITE 1.2 CAL) Stopped (03/13/20 1405)   Principal Problem:   Acute metabolic encephalopathy Active Problems:   Type 2 diabetes mellitus with hyperlipidemia (HCC)   Essential hypertension   CVA (cerebral vascular accident) ([place])   Acute on  chronic combined systolic and diastolic CHF (congestive heart failure) (HCC)   Right bundle branch block   Atrial fibrillation with RVR (HCC)   Atrial fibrillation, chronic (HCC)   Left-sided weakness   Goals of care, counseling/discussion   Palliative care by specialist  Time spent:   Irwin Brakeman, MD [facility] 03/15/2020, 11:55 AM    LOS: 5 days  How to contact the [facility] Attending or Consulting provider [place] or covering provider during after hours [place], for this patient?  1. Check the care team in [facility] and look for a) attending/consulting [facility] provider listed and b) the [facility] team listed 2. Log into www.amion.com and use 's universal password to access. If you do not have the password, please contact the hospital operator. 3. Locate the [facility] provider you are looking for under [facility] and page to a number that you can be directly reached. 4. If you still have difficulty reaching the provider, please page the [facility] (Director on Call) for the Hospitalists listed on amion for assistance.

## 2020-03-16 LAB — CBC
HCT: 36.9 % (ref 36.0–46.0)
Hemoglobin: 10.7 g/dL — ABNORMAL LOW (ref 12.0–15.0)
MCH: 26.2 pg (ref 26.0–34.0)
MCHC: 29 g/dL — ABNORMAL LOW (ref 30.0–36.0)
MCV: 90.4 fL (ref 80.0–100.0)
Platelets: 244 10*3/uL (ref 150–400)
RBC: 4.08 MIL/uL (ref 3.87–5.11)
RDW: 14.4 % (ref 11.5–15.5)
WBC: 5.5 10*3/uL (ref 4.0–10.5)
nRBC: 0 % (ref 0.0–0.2)

## 2020-03-16 LAB — BASIC METABOLIC PANEL
Anion gap: 11 (ref 5–15)
BUN: 24 mg/dL — ABNORMAL HIGH (ref 8–23)
CO2: 28 mmol/L (ref 22–32)
Calcium: 9.8 mg/dL (ref 8.9–10.3)
Chloride: 106 mmol/L (ref 98–111)
Creatinine, Ser: 1.68 mg/dL — ABNORMAL HIGH (ref 0.44–1.00)
GFR calc Af Amer: 33 mL/min — ABNORMAL LOW (ref >60)
GFR calc non Af Amer: 28 mL/min — ABNORMAL LOW (ref >60)
Glucose, Bld: 96 mg/dL (ref 70–99)
Potassium: 3.8 mmol/L (ref 3.5–5.1)
Sodium: 145 mmol/L (ref 135–145)

## 2020-03-16 LAB — GLUCOSE, CAPILLARY
Glucose-Capillary: 91 mg/dL (ref 70–99)
Glucose-Capillary: 154 mg/dL — ABNORMAL HIGH (ref 70–99)
Glucose-Capillary: 83 mg/dL (ref 70–99)
Glucose-Capillary: 147 mg/dL — ABNORMAL HIGH (ref 70–99)

## 2020-03-16 LAB — MAGNESIUM: Magnesium: 2.2 mg/dL (ref 1.7–2.4)

## 2020-03-16 MED ORDER — AMLODIPINE BESYLATE 5 MG PO TABS
10.0000 mg | ORAL_TABLET | Freq: Every day | ORAL | Status: DC
  Administered 2020-03-17: 10 mg via ORAL
  Filled 2020-03-16: qty 2

## 2020-03-16 NOTE — Progress Notes (Signed)
Occupational Therapy Treatment Patient Details Name: [name] MRN: [id number] DOB: 1936-09-10 Today's Date: 03/16/2020    History of present illness [name] is a 84 y.o. female with medical history significant for atrial fibrillation, diabetes mellitus, hypertension, coronary artery disease, systolic and diastolic CHF, CVA. History is obtained from chart review and patient's daughter, at the time of my evaluation patient is somnolent and unable to give me history. Patient woke up this morning at about 8:30 AM, she was normal took her medications to eat her breakfast, she was able to ambulate, start on so far at about 9 AM.  Family noticed that patient started slurring her words at about 10:00.  Was also unable to stand to go to the bathroom.  Patient is on Eliquis and last took her medications this morning.   OT comments  Pt agreeable to transfer to recliner to eat her breakfast after Nursing finished with morning medications. Patient demonstrates some active movement in her Left shoulder. While she attempted to transition from supine to sitting on EOB she demonstrated some shoulder flexion (approximately 10 degrees) briefly. Unable to utilize left hand to grip or hold onto items during session. Overall, progressing towards goals. Pt continues to require extensive physical assistance to complete basic ADL tasks and transfers. Recommendation at discharge continues to be SNF at this time. OT will continue to follow patient acutely.            Precautions / Restrictions Precautions Precautions: Fall Restrictions Weight Bearing Restrictions: No       Mobility Bed Mobility Overal bed mobility: Needs Assistance Bed Mobility: Supine to Sit     Supine to sit: Mod assist;HOB elevated     General bed mobility comments: Increased time with tactile and verbal cues provided for hand placement and technique.  Transfers Overall transfer level: Needs assistance Equipment used:  None Transfers: Stand Pivot Transfers Sit to Stand: Mod assist Stand pivot transfers: Mod assist       General transfer comment: Once standing, patient required physical guidance to begin moving to transition to recliner. Did not initiate right foot forward to step towards recliner.    Balance Overall balance assessment: Needs assistance Sitting-balance support: Feet supported;No upper extremity supported Sitting balance-Leahy Scale: Fair Sitting balance - Comments: Seated at EOB. No lean noted during session.   Standing balance support: During functional activity;No upper extremity supported Standing balance-Leahy Scale: Poor        ADL either performed or assessed with clinical judgement   ADL Overall ADL's : Needs assistance/impaired   Lower Body Dressing: Total assistance;Bed level Lower Body Dressing Details (indicate cue type and reason): donning socks Toilet Transfer: Moderate assistance;Stand-pivot Toilet Transfer Details (indicate cue type and reason): From bed to recliner                           Cognition Arousal/Alertness: Awake/alert Behavior During Therapy: (Frequent bursts of crying during session with no known cause.) Overall Cognitive Status: Within Functional Limits for tasks assessed                      Pertinent Vitals/ Pain       Pain Assessment: No/denies pain Faces Pain Scale: No hurt         Frequency  Min 2X/week        Progress Toward Goals  OT Goals(current goals can now be found in the care plan section)  Progress towards  OT goals: Progressing toward goals     Plan Discharge plan remains appropriate;Frequency remains appropriate       AM-PAC OT "6 Clicks" Daily Activity     Outcome Measure   Help from another person eating meals?: A Lot Help from another person taking care of personal grooming?: A Lot Help from another person toileting, which includes using toliet, bedpan, or urinal?: Total Help from  another person bathing (including washing, rinsing, drying)?: Total Help from another person to put on and taking off regular upper body clothing?: Total Help from another person to put on and taking off regular lower body clothing?: Total 6 Click Score: 8    End of Session Equipment Utilized During Treatment: Gait belt  OT Visit Diagnosis: Muscle weakness (generalized) (M62.81);Hemiplegia and hemiparesis Hemiplegia - dominant/non-dominant: Non-Dominant Hemiplegia - caused by: Cerebral infarction   Activity Tolerance Patient tolerated treatment well   Patient Left in chair;with call bell/phone within reach;with chair alarm set;with nursing/sitter in room   Nurse Communication   Patient's performance level during transfer.        Time: [id number] OT Time Calculation (min): 26 min  Charges: OT General Charges $OT Visit: 1 Visit OT Treatments $Self Care/Home Management : 23-37 mins  Ailene Ravel, OTR/L,CBIS  [phone]    Sadarius Norman, Clarene Duke 03/16/2020, 11:16 AM

## 2020-03-16 NOTE — Progress Notes (Signed)
PROGRESS NOTE [facility] ERMALINDA ELIZABETH  [id number]  DOB: December 02, 1936  DOA: 03/10/2020 PCP: Jani Gravel, MD   Brief Admission Hx: 84 year old female with chronic atrial fibrillation, diabetes mellitus type 2, hypertension, biventricular heart failure, cerebrovascular disease and coronary artery disease presents with sudden onset of left-sided weakness.  After speaking with her son who is her primary caretaker he tells me that there has been a problem with her medications as she had missed some doses of medications when she was with other family members but he notes that he usually makes sure that she has not missed any doses.  She presents markedly volume overloaded with pulmonary edema and I also worry that she missed some of her Eliquis doses which may have precipitated the CVA.   MDM/Assessment & Plan:   1. Acute embolic CVA-repeated MRI with light sedation confirms acute infarction.   Unfortunately patient continues to have significant dysphagia but had barium swallow on 4/2 and SLP placed on dys1 diet, thin liquids.    Pt qualifies for SNF placement however after speaking with son, he would like to take patient home with family providing 24/7 care.    Neurology has seen and recommended ASA 325 mg and consider switching to xarelto however due to her CKD it may me reasonable to stay on apixaban.   2. Acute delirium-with patient's vascular dementia she continues to sundown in the evening hours.  She has been medicated with haloperidol for severe agitation symptoms.  Delirium precautions.  If continues can consider scheduled Seroquel. 3. Dysphagia - Fortunately after MBS done 4/2 patient is now able to have a dysphagia 1 diet and we were able to avoid placing NG tube.     4. Chronic atrial fibrillation - rate currently controlled, anticoagulated with apixaban. 5. Hypertension-blood pressures elevated, we allowed for permissive hypertension in the setting of the acute CVA and now after 48  hours we will slowly start to lower blood pressure to more normal levels.  Starting coreg 6.25 mg BID, amlodipine 5 mg daily.    6. History of remote seizure activity-she had been started on Keppra by neurology earlier this year as empiric treatment for suspected unwitnessed seizures.  Keppra IV ordered for now.  Hopefully can transition to oral Keppra after the NG tube has been placed. 7. HFrEF - REsolved. Pt was initially very volume overloaded and I suspect has not been receiving lasix as prescribed as family members have told me she had missed doses of medications.  She had findings of pulmonary edema on chest xray.  She was diuresed with IV lasix.  IV Lasix has been discontinued. 8. CKD stage 3 - Monitoring creatinine closely. 9. Type 2 DM - had been controlled on Actos, Januvia,- Would rethink starting her back on actos with her known biventricular CHF, hold januvia while in hospital. Monitor CBGs.  SSI as needed.  10. Chronic respiratory failure - continue supplemental oxygen.  11. Gout - resume allopurinol when able.   12. Hypernatremia -resolved after receiving free water.  Recheck in AM.  13. Hypokalemia -oral replacement, mag is WNL.   DVT prophylaxis: heparin  Code Status: Full  Family Communication: son [name] updated regularly   Disposition Plan:  patient likely to go home with [facility] and 24/7 supervision tomorrow    Consultants:  Neurology  Palliative   Procedures:    Antimicrobials:     Subjective: Pt less agitated this morning.  Pt tolerating dysphagia diet.   Objective: Vitals:   03/15/20  1638 03/15/20 2005 03/15/20 2147 03/16/20 0544  BP: 138/78  (!) 144/67 (!) 157/72  Pulse: 71 76 77 78  Resp:  16 17 18   Temp:   98.2 F (36.8 C) 97.7 F (36.5 C)  TempSrc:   Oral   SpO2:   97% 100%  Weight:      Height:        Intake/Output Summary (Last 24 hours) at 03/16/2020 1246 Last data filed at 03/16/2020 0919 Gross per 24 hour  Intake 360 ml  Output --  Net 360 ml    Filed Weights   03/12/20 0500 03/14/20 0350 03/15/20 0519  Weight: 79.3 kg 78.1 kg 77.4 kg   REVIEW OF SYSTEMS  UTO due to current condition   Exam:  General exam: elderly frail female, awake, somnolent, NAD.  Notes left facial droop and tongue protruding to the left.  Left eye closed.  Speech is minimally improved today. Respiratory system: No crackles or rails heard today.  No increased work of breathing. Cardiovascular system: irregularly irregular, S1 & S2 heard. Mild JVD, trace pedal edema. Gastrointestinal system: Abdomen is nondistended, soft and nontender. Normal bowel sounds heard. Central nervous system: somnolent, left facial droop, tongue protrudes to the left.  Pronounced dense left hemiparesis unchanged. Extremities: Trace pretibial edema BLEs.   Data Reviewed: Basic Metabolic Panel: Recent Labs  Lab 03/12/20 0532 03/13/20 0506 03/14/20 0600 03/15/20 0604 03/16/20 0436  NA 147* 143 141 145 145  K 3.2* 3.2* 3.3* 3.5 3.8  CL 103 97* 99 103 106  CO2 30 31 30 29 28   GLUCOSE 95 111* 127* 104* 96  BUN 17 15 16 19  24*  CREATININE 1.70* 1.65* 1.53* 1.75* 1.68*  CALCIUM 9.2 9.6 9.5 9.7 9.8  MG 1.9 1.8 1.9 2.0 2.2   Liver Function Tests: Recent Labs  Lab 03/10/20 1312 03/11/20 0404  AST 13* 11*  ALT 12 11  ALKPHOS 149* 126  BILITOT 0.3 0.7  PROT 6.8 6.4*  ALBUMIN 3.1* 3.0*   No results for input(s): LIPASE, AMYLASE in the last 168 hours. No results for input(s): AMMONIA in the last 168 hours. CBC: Recent Labs  Lab 03/10/20 1312 03/11/20 0404 03/12/20 0532 03/13/20 0506 03/14/20 0600 03/15/20 0604 03/16/20 0436  WBC 5.3   < > 4.7 4.9 4.9 5.9 5.5  NEUTROABS 3.4  --   --   --   --   --   --   HGB 10.9*  10.1*   < > 10.2* 11.1* 11.4* 11.7* 10.7*  HCT 32.0*  33.9*   < > 34.7* 37.2 37.9 40.3 36.9  MCV 89.0   < > 91.6 87.5 87.7 89.6 90.4  PLT 242   < > 209 255 236 241 244   < > = values in this interval not displayed.   Cardiac Enzymes: No  results for input(s): CKTOTAL, CKMB, CKMBINDEX, TROPONINI in the last 168 hours. CBG (last 3)  Recent Labs    03/15/20 2148 03/16/20 0729 03/16/20 1106  GLUCAP 124* 91 154*   Recent Results (from the past 240 hour(s))  Urine culture     Status: Abnormal   Collection Time: 03/10/20  3:38 PM   Specimen: Urine, Random  Result Value Ref Range Status   Specimen Description   Final    URINE, RANDOM Performed at [facility], [address] [place], [place] [address]    Special Requests   Final    NONE Performed at [facility], [address] [place],  [place] [address]    Culture >=100,000 COLONIES/mL CITROBACTER YOUNGAE (A)  Final   Report Status 03/12/2020 FINAL  Final   Organism ID, Bacteria CITROBACTER YOUNGAE (A)  Final      Susceptibility   Citrobacter youngae - MIC*    CEFAZOLIN >=64 RESISTANT Resistant     CEFTRIAXONE >=64 RESISTANT Resistant     CIPROFLOXACIN <=0.25 SENSITIVE Sensitive     GENTAMICIN <=1 SENSITIVE Sensitive     IMIPENEM 1 SENSITIVE Sensitive     NITROFURANTOIN <=16 SENSITIVE Sensitive     TRIMETH/SULFA <=20 SENSITIVE Sensitive     PIP/TAZO 64 INTERMEDIATE Intermediate     * >=100,000 COLONIES/mL CITROBACTER YOUNGAE  Respiratory Panel by RT PCR (Flu A&B, Covid) - Nasopharyngeal Swab     Status: None   Collection Time: 03/10/20  3:39 PM   Specimen: Nasopharyngeal Swab  Result Value Ref Range Status   SARS Coronavirus 2 by RT PCR NEGATIVE NEGATIVE Final    Comment: (NOTE) SARS-CoV-2 target nucleic acids are NOT DETECTED. The SARS-CoV-2 RNA is generally detectable in upper respiratoy specimens during the acute phase of infection. The lowest concentration of SARS-CoV-2 viral copies this assay can detect is 131 copies/mL. A negative result does not preclude SARS-Cov-2 infection and should not be used as the sole basis for treatment or other patient management decisions. A negative result may occur with  improper specimen collection/handling, submission  of specimen other than nasopharyngeal swab, presence of viral mutation(s) within the areas targeted by this assay, and inadequate number of viral copies (<131 copies/mL). A negative result must be combined with clinical observations, patient history, and epidemiological information. The expected result is Negative. Fact Sheet for Patients:  PinkCheek.be Fact Sheet for Healthcare Providers:  GravelBags.it This test is not yet ap proved or cleared by the [place] FDA and  has been authorized for detection and/or diagnosis of SARS-CoV-2 by FDA under an Emergency Use Authorization (EUA). This EUA will remain  in effect (meaning this test can be used) for the duration of the COVID-19 declaration under Section 564(b)(1) of the Act, 21 U.S.C. section 360bbb-3(b)(1), unless the authorization is terminated or revoked sooner.    Influenza A by PCR NEGATIVE NEGATIVE Final   Influenza B by PCR NEGATIVE NEGATIVE Final    Comment: (NOTE) The Xpert Xpress SARS-CoV-2/FLU/RSV assay is intended as an aid in  the diagnosis of influenza from Nasopharyngeal swab specimens and  should not be used as a sole basis for treatment. Nasal washings and  aspirates are unacceptable for Xpert Xpress SARS-CoV-2/FLU/RSV  testing. Fact Sheet for Patients: PinkCheek.be Fact Sheet for Healthcare Providers: GravelBags.it This test is not yet approved or cleared by the [place] FDA and  has been authorized for detection and/or diagnosis of SARS-CoV-2 by  FDA under an Emergency Use Authorization (EUA). This EUA will remain  in effect (meaning this test can be used) for the duration of the  Covid-19 declaration under Section 564(b)(1) of the Act, 21  U.S.C. section 360bbb-3(b)(1), unless the authorization is  terminated or revoked. Performed at [facility], [address] [place], [place]  [address]   SARS CORONAVIRUS 2 (TAT 6-24 HRS) Nasopharyngeal Nasopharyngeal Swab     Status: None   Collection Time: 03/10/20  4:19 PM   Specimen: Nasopharyngeal Swab  Result Value Ref Range Status   SARS Coronavirus 2 NEGATIVE NEGATIVE Final    Comment: (NOTE) SARS-CoV-2 target nucleic acids are NOT DETECTED. The SARS-CoV-2 RNA is generally detectable in upper and lower  respiratory specimens during the acute phase of infection. Negative results do not preclude SARS-CoV-2 infection, do not rule out co-infections with other pathogens, and should not be used as the sole basis for treatment or other patient management decisions. Negative results must be combined with clinical observations, patient history, and epidemiological information. The expected result is Negative. Fact Sheet for Patients: SugarRoll.be Fact Sheet for Healthcare Providers: https://www.woods-mathews.com/ This test is not yet approved or cleared by the [place] FDA and  has been authorized for detection and/or diagnosis of SARS-CoV-2 by FDA under an Emergency Use Authorization (EUA). This EUA will remain  in effect (meaning this test can be used) for the duration of the COVID-19 declaration under Section 56 4(b)(1) of the Act, 21 U.S.C. section 360bbb-3(b)(1), unless the authorization is terminated or revoked sooner. Performed at [facility], [place] [address], [place], [place] [address]   MRSA PCR Screening     Status: Abnormal   Collection Time: 03/10/20  6:18 PM   Specimen: Nasal Mucosa; Nasopharyngeal  Result Value Ref Range Status   MRSA by PCR POSITIVE (A) NEGATIVE Final    Comment:        The GeneXpert MRSA Assay (FDA approved for NASAL specimens only), is one component of a comprehensive MRSA colonization surveillance program. It is not intended to diagnose MRSA infection nor to guide or monitor treatment for MRSA infections. RESULT CALLED TO, READ  BACK BY AND VERIFIED WITH: Kirtland Bouchard @0142  03/11/20 [facility] Performed at [facility], [address] [place], [place] [address]      Studies: No results found. Scheduled Meds: . amLODipine  5 mg Oral Daily  . apixaban  2.5 mg Oral BID  . aspirin  81 mg Oral Daily  . carvedilol  6.25 mg Oral BID WC  . insulin aspart  0-9 Units Subcutaneous TID WC  . levETIRAcetam  500 mg Oral BID  . memantine  5 mg Oral BID   Continuous Infusions: . feeding supplement (OSMOLITE 1.2 CAL) Stopped (03/13/20 1405)    Principal Problem:   Acute metabolic encephalopathy Active Problems:   Type 2 diabetes mellitus with hyperlipidemia (HCC)   Essential hypertension   CVA (cerebral vascular accident) ([place])   Acute on chronic combined systolic and diastolic CHF (congestive heart failure) (HCC)   Right bundle branch block   Atrial fibrillation with RVR (HCC)   Atrial fibrillation, chronic (HCC)   Left-sided weakness   Goals of care, counseling/discussion   Palliative care by specialist  Time spent:   Irwin Brakeman, MD [facility] 03/16/2020, 12:45 PM    LOS: 6 days  How to contact the [facility] Attending or Consulting provider [place] or covering provider during after hours [place], for this patient?  1. Check the care team in [facility] and look for a) attending/consulting [facility] provider listed and b) the [facility] team listed 2. Log into [email or web address] and use [place]'s universal password to access. If you do not have the password, please contact the hospital operator. 3. Locate the [facility] provider you are looking for under [facility] and page to a number that you can be directly reached. 4. If you still have difficulty reaching the provider, please page the [facility] (Director on Call) for the Hospitalists listed on amion for assistance.

## 2020-03-16 NOTE — Progress Notes (Signed)
Physical Therapy Treatment Patient Details Name: [name] MRN: [id number] DOB: 16-Oct-1936 Today's Date: 03/16/2020    History of Present Illness [name] is a 84 y.o. female with medical history significant for atrial fibrillation, diabetes mellitus, hypertension, coronary artery disease, systolic and diastolic CHF, CVA. History is obtained from chart review and patient's daughter, at the time of my evaluation patient is somnolent and unable to give me history. Patient woke up this morning at about 8:30 AM, she was normal took her medications to eat her breakfast, she was able to ambulate, start on so far at about 9 AM.  Family noticed that patient started slurring her words at about 10:00.  Was also unable to stand to go to the bathroom.  Patient is on Eliquis and last took her medications this morning.    PT Comments    Patient presents up in chair (assisted by OT) and agreeable for therapy.  Patient demonstrates slow labored movement for completing sit to stands, slightly increased grip strength left hand, but unable to hold onto RW without assistance, limited to a few side steps at bedside due to left side weakness, poor standing balance, incontinent of stool and put back to bed after therapy - NT notified to clean patient.  Patient will benefit from continued physical therapy in hospital and recommended venue below to increase strength, balance, endurance for safe ADLs and gait.   Follow Up Recommendations  SNF     Equipment Recommendations  None recommended by PT    Recommendations for Other Services       Precautions / Restrictions Precautions Precautions: Fall Restrictions Weight Bearing Restrictions: No    Mobility  Bed Mobility Overal bed mobility: Needs Assistance Bed Mobility: Sit to Supine     Supine to sit: Mod assist     General bed mobility comments: Patient presents seated in chair (assisted by OT), demonstrates slow labored movement for getting  back into bed  Transfers Overall transfer level: Needs assistance Equipment used: Rolling walker (2 wheeled) Transfers: Stand Pivot Transfers;Squat Pivot Transfers Sit to Stand: Mod assist Stand pivot transfers: Mod assist       General transfer comment: diffiuclty advancing LLE due to weakness, poor grip strength left hand  Ambulation/Gait Ambulation/Gait assistance: Mod assist;Max assist Gait Distance (Feet): 3 Feet Assistive device: Rolling walker (2 wheeled) Gait Pattern/deviations: Decreased step length - right;Decreased step length - left;Decreased stance time - left;Decreased stride length;Shuffle Gait velocity: slow   General Gait Details: limited to 3-4 slow unsteady steps with mostling dragging/shuffling of left foot due to weakness.   Stairs             Wheelchair Mobility    Modified Rankin (Stroke Patients Only)       Balance Overall balance assessment: Needs assistance Sitting-balance support: Feet supported;No upper extremity supported Sitting balance-Leahy Scale: Fair Sitting balance - Comments: seated in chair, had to rest right elbow on armrest for support   Standing balance support: During functional activity;Bilateral upper extremity supported Standing balance-Leahy Scale: Poor Standing balance comment: fair/poor using RW with left hand held to walker with assistance                            Cognition Arousal/Alertness: Awake/alert Behavior During Therapy: WFL for tasks assessed/performed Overall Cognitive Status: Within Functional Limits for tasks assessed  Exercises General Exercises - Lower Extremity Long Arc Quad: Seated;AROM;Strengthening;Both;10 reps Long Arc Quad Limitations: limited ROM LLE due to weakness Hip Flexion/Marching: Seated;AROM;Strengthening;Both;10 reps;Limitations Hip Flexion/Marching Limitations: limited left hip flexion due to weakness Toe  Raises: Seated;AROM;Strengthening;Both;10 reps Heel Raises: Seated;AROM;Strengthening;Both;10 reps    General Comments        Pertinent Vitals/Pain Pain Assessment: No/denies pain    Home Living                      Prior Function            PT Goals (current goals can now be found in the care plan section) Acute Rehab PT Goals Patient Stated Goal: return home with family to assist PT Goal Formulation: With patient Time For Goal Achievement: 03/26/20 Potential to Achieve Goals: Good Progress towards PT goals: Progressing toward goals    Frequency    Min 5X/week      PT Plan Current plan remains appropriate    Co-evaluation              AM-PAC PT "6 Clicks" Mobility   Outcome Measure  Help needed turning from your back to your side while in a flat bed without using bedrails?: A Lot Help needed moving from lying on your back to sitting on the side of a flat bed without using bedrails?: A Lot Help needed moving to and from a bed to a chair (including a wheelchair)?: A Lot Help needed standing up from a chair using your arms (e.g., wheelchair or bedside chair)?: A Lot Help needed to walk in hospital room?: A Lot Help needed climbing 3-5 steps with a railing? : Total 6 Click Score: 11    End of Session Equipment Utilized During Treatment: Oxygen Activity Tolerance: Patient tolerated treatment well;Patient limited by fatigue Patient left: in bed;with call bell/phone within reach Nurse Communication: Mobility status PT Visit Diagnosis: Unsteadiness on feet (R26.81);Other abnormalities of gait and mobility (R26.89);Muscle weakness (generalized) (M62.81);Hemiplegia and hemiparesis Hemiplegia - Right/Left: Left Hemiplegia - dominant/non-dominant: Non-dominant Hemiplegia - caused by: Cerebral infarction     Time: 1533-1601 PT Time Calculation (min) (ACUTE ONLY): 28 min  Charges:  $Therapeutic Exercise: 8-22 mins $Therapeutic Activity: 8-22 mins                      4:13 PM, 03/16/20 Lonell Grandchild, MPT Physical Therapist with [facility] [phone] office [phone] mobile phone

## 2020-03-16 NOTE — TOC Initial Note (Addendum)
Transition of Care [facility]) - Initial/Assessment Note    Patient Details  Name: [name] MRN: [id number] Date of Birth: 07/03/1936  [facility]) CM/SW Contact:    Trish Mage, LCSW Phone Number: 03/16/2020, 11:34 AM  Clinical Narrative:    Called brother to confirm plan.  He plans to have patient stay with him temporarily until a more permanent plan can be made for her to stay with a sister.  He states one of his sisters will be helping him out as well.  She already has a walker.  Patient will need order for 3 in 1 at d/c.  I called Sarah with [facility] to arrange [facility].  Waiting to hear back. TOC will continue to follow during the course of hospitalization.  Addendum:  [facility] willing to take patient for PT,OT.  Confirmed by Judson Roch.               Expected Discharge Plan: [place] Services Barriers to Discharge: Other (comment)(HH pending offer)   Patient Goals and CMS Choice        Expected Discharge Plan and Services Expected Discharge Plan: [place]   Discharge Planning Services: CM Consult Post Acute Care Choice: [place] arrangements for the past 2 months: Single Family Home                                      Prior Living Arrangements/Services Living arrangements for the past 2 months: Single Family Home Lives with:: Siblings Patient language and need for interpreter reviewed:: Yes Do you feel safe going back to the place where you live?: Yes      Need for Family Participation in Patient Care: Yes (Comment) Care giver support system in place?: Yes (comment) Current home services: DME Criminal Activity/Legal Involvement Pertinent to Current Situation/Hospitalization: No - Comment as needed  Activities of Daily Living Home Assistive Devices/Equipment: Walker (specify type), Eyeglasses, External Monitoring Devices, Oxygen ADL Screening (condition at time of admission) Patient's cognitive ability  adequate to safely complete daily activities?: No Is the patient deaf or have difficulty hearing?: No Does the patient have difficulty seeing, even when wearing glasses/contacts?: Yes Does the patient have difficulty concentrating, remembering, or making decisions?: Yes Patient able to express need for assistance with ADLs?: No Does the patient have difficulty dressing or bathing?: Yes Independently performs ADLs?: Yes (appropriate for developmental age) Does the patient have difficulty walking or climbing stairs?: Yes Weakness of Legs: Both Weakness of Arms/Hands: Both  Permission Sought/Granted Permission sought to share information with : Family Supports Permission granted to share information with : Yes, Verbal Permission Granted  Share Information with NAME: Franciso Bend     Permission granted to share info w Relationship: brother     Emotional Assessment         Alcohol / Substance Use: Not Applicable Psych Involvement: No (comment)  Admission diagnosis:  Nystagmus [H55.00] Encephalopathy [G93.40] Left-sided weakness [id number] Acute metabolic encephalopathy [id number] Patient Active Problem List   Diagnosis Date Noted  . Goals of care, counseling/discussion   . Palliative care by specialist   . Left-sided weakness 03/10/2020  . Aspiration pneumonia of lower lobe ([place])   . Urinary tract infection without hematuria   . Acute metabolic encephalopathy 0000000  . Syncope and collapse 05/17/2018  . Chronic combined systolic and diastolic CHF (congestive heart failure) ([place]) 05/16/2018  .  Acute renal failure superimposed on stage 3 chronic kidney disease ([place]) 05/16/2018  . Atrial fibrillation, chronic ([place]) 05/14/2018  . Atrial fibrillation with RVR ([place]) 04/24/2018  . Hypokalemia 04/24/2018  . Acute exacerbation of CHF (congestive heart failure) ([place]) 09/01/2015  . CHF (congestive heart failure) ([place]) 07/02/2015  . Elevated troponin I level 07/02/2015  . Right bundle  branch block 07/02/2015  . Renal insufficiency 07/02/2015  . CAP (community acquired pneumonia) 04/25/2015  . Acute respiratory failure with hypoxia ([place]) 04/25/2015  . Acute renal injury ([place]) 04/25/2015  . Type 2 diabetes with nephropathy ( [place]) 04/25/2015  . ARF (acute renal failure) ([place]) 06/18/2014  . Mitral regurgitation 06/09/2014  . Dyspnea 06/06/2014  . Asthma exacerbation 06/06/2014  . Acute on chronic combined systolic and diastolic CHF (congestive heart failure) ([place]) 06/06/2014  . Influenza 12/10/2012  . Type 2 diabetes mellitus with hyperlipidemia ([place]) 07/08/2010  . Essential hypertension 04/06/2010  . CORONARY ATHEROSCLEROSIS NATIVE CORONARY ARTERY 04/06/2010  . CARDIOMYOPATHY 03/29/2010  . CVA (cerebral vascular accident) ([place]) 03/29/2010   PCP:  Jani Gravel, MD Pharmacy:   [facility] [place] [address] [place] [address] Phone: [phone] Fax: [phone]     Social Determinants of Health (SDOH) Interventions    Readmission Risk Interventions No flowsheet data found.

## 2020-03-16 NOTE — Plan of Care (Signed)
  Problem: Nutrition: Goal: Adequate nutrition will be maintained Outcome: Progressing   Problem: Nutrition: Goal: Dietary intake will improve Outcome: Progressing

## 2020-03-16 NOTE — Progress Notes (Signed)
  Speech Language Pathology Treatment: Dysphagia  Patient Details Name: [name] MRN: [id number] DOB: Jul 01, 1936 Today's Date: 03/16/2020 Time: [id number] SLP Time Calculation (min) (ACUTE ONLY): 28 min  Assessment / Plan / Recommendation Clinical Impression  SLP provided ongoing diagnostic dysphagia therapy. Pt presented today with a very flat affect, answering questions appropriately but seeming less motivated and more discouraged than when last seen 4/2. When asked if she could hold the cup for thin trials she said "no", SLP then asked "are you okay? Are you sad today?" and Pt immediately burst into tears and remained in this state for a few minutes; SLP provided counseling and support. Pt eventually regained her composure and SLP again prompted her to self-feed thin trials via cup and she did this with L anterior spillage, and lack of oral coordination; one immediate cough was noted with initial sip however after cues to take one sip at a time no further difficulty was observed. Pt consumed puree textures without incident and SLP then provided limited mech soft trials and Pt demonstrated prolonged oral manipulation of bolus with mild/mod L pocketing after the swallow however note reflexive lingual sweep and repeat swallow that was seemingly effective in clearing L sulcus. Recommend continue with D1/puree diet and thin liquids with strategies: encourage Pt to self-feed w/ hand over hand support if needed, alternate bites and sips, encourage repeat swallow with all bites/sips, small bites and sips. Above to RN, ST will continue to follow,   HPI HPI: 84 y.o. female  with past medical history of stroke, dementia, CAD, combined systolic/diastolic CHF (EF [id number]), 2L oxygen at home, atrial fibrillation on Eliquis, diabetes, HTN, CKD stage 3b. Hospitalization 12/26/19-01/01/20 with UTI and aspiration pneumonia and concern for possible seizure. Admitted on 03/10/2020 with slurred speech and left facial  droop with worsening weakness left side worse than right. CT head with no evidence of stroke. Treating for UTI and probable pneumonia as well as pulmonary edema. Head CT negative for acute abnormality, shows stable chronic encephalomalacia involving the right frontal white matter, medial left occipital lobe and posterior left cerebellum, moderate generalized atrophy, stable remote lacunar basal ganglia infarcts. MRI motion degraded but shows: Restricted diffusion in the deep white matter on the right compatible with acute infarction. Portable chest x-ray showed cardiomegaly with pulmonary vascular congestion and pulmonary edema, small bilateral pleural effusions, consolidation versus atelectasis at the left lung base. MBS complete 4/2 recommending puree/d1 and thin liquids.      SLP Plan  Continue with current plan of care       Recommendations  Diet recommendations: Thin liquid;Dysphagia 1 (puree) Liquids provided via: Cup Medication Administration: Whole meds with puree Supervision: Staff to assist with self feeding;Full supervision/cueing for compensatory strategies Compensations: Minimize environmental distractions;Slow rate;Small sips/bites;Multiple dry swallows after each bite/sip;Monitor for anterior loss;Lingual sweep for clearance of pocketing;Follow solids with liquid Postural Changes and/or Swallow Maneuvers: Seated upright 90 degrees;Upright 30-60 min after meal                Oral Care Recommendations: Oral care BID Follow up Recommendations: 24 hour supervision/assistance;Skilled Nursing facility SLP Visit Diagnosis: Dysphagia, oropharyngeal phase (R13.12) Plan: Continue with current plan of care       Tamicka Shimon H. Roddie Mc, CCC-SLP Speech Language Pathologist   Wende Bushy 03/16/2020, 10:35 AM

## 2020-03-16 NOTE — Care Management Important Message (Signed)
Important Message  Patient Details  Name: [name] MRN: [id number] Date of Birth: 1936/02/02   Medicare Important Message Given:  Yes     Tommy Medal 03/16/2020, 12:14 PM

## 2020-03-16 NOTE — TOC Progression Note (Addendum)
Transition of Care [facility]) - Progression Note    Patient Details  Name: [name] MRN: [id number] Date of Birth: [birth year]  Transition of Care [facility]) CM/SW [place], LCSW Phone Number: 03/16/2020, 3:53 PM  Clinical Narrative: CSW called patient's son, Franciso Bend, to follow up with patient's home [place], but son reported he no longer had the phone number. CSW confirmed with [employer] that patient receives her O2 supplies through the company. CSW provided Tiffany with [employer] patient's son's address as patient will not discharge to her home and will remain with her son until other arrangements are made. CSW received voicemail from son requesting a wheelchair for the patient as the patient does not have one. CSW left voicemail for Juliann Pulse with [employer] to make a wheelchair referral. CSW called son and provided him with [employer]'s contact information and explained he will need the move the concentrator to his home and to call [employer] once the patient discharges so that O2 tanks can be delivered to his home.  Addendum: 5:02pm-Received call from [facility] with [employer] and the referral for a wheelchair was accepted.  Expected Discharge Plan: [place] Services Barriers to Discharge: Other (comment)(HH pending offer)  Expected Discharge Plan and Services Expected Discharge Plan: [place]   Discharge Planning Services: CM Consult Post Acute Care Choice: [place] arrangements for the past 2 months: Single Family Home  Readmission Risk Interventions No flowsheet data found.

## 2020-03-17 DIAGNOSIS — I482 Chronic atrial fibrillation, unspecified: Secondary | ICD-10-CM

## 2020-03-17 DIAGNOSIS — I10 Essential (primary) hypertension: Secondary | ICD-10-CM

## 2020-03-17 DIAGNOSIS — I4510 Unspecified right bundle-branch block: Secondary | ICD-10-CM

## 2020-03-17 LAB — GLUCOSE, CAPILLARY
Glucose-Capillary: 101 mg/dL — ABNORMAL HIGH (ref 70–99)
Glucose-Capillary: 157 mg/dL — ABNORMAL HIGH (ref 70–99)

## 2020-03-17 MED ORDER — SITAGLIPTIN PHOSPHATE 25 MG PO TABS: 25.0000 mg | ORAL_TABLET | Freq: Every day | ORAL | 1 refills | Status: DC

## 2020-03-17 MED ORDER — ASPIRIN 81 MG PO CHEW: 81.0000 mg | CHEWABLE_TABLET | Freq: Every day | ORAL | 0 refills | Status: AC

## 2020-03-17 MED ORDER — ATORVASTATIN CALCIUM 10 MG PO TABS: 10.0000 mg | ORAL_TABLET | Freq: Every day | ORAL | 0 refills | Status: AC

## 2020-03-17 MED ORDER — MEMANTINE HCL 5 MG PO TABS: 5.0000 mg | ORAL_TABLET | Freq: Two times a day (BID) | ORAL | 1 refills | Status: AC

## 2020-03-17 MED ORDER — CARVEDILOL 6.25 MG PO TABS: 6.2500 mg | ORAL_TABLET | Freq: Two times a day (BID) | ORAL | 1 refills | Status: AC

## 2020-03-17 MED ORDER — ASPIRIN 81 MG PO CHEW: 81.0000 mg | CHEWABLE_TABLET | Freq: Every day | ORAL | 0 refills | Status: DC

## 2020-03-17 MED ORDER — FUROSEMIDE 20 MG PO TABS: 20.0000 mg | ORAL_TABLET | Freq: Every day | ORAL | 0 refills | Status: DC | PRN

## 2020-03-17 MED ORDER — HYDRALAZINE HCL 25 MG PO TABS: 75.0000 mg | ORAL_TABLET | Freq: Three times a day (TID) | ORAL | 1 refills | Status: AC

## 2020-03-17 NOTE — Discharge Summary (Addendum)
Physician Discharge Summary  [name] [id number] DOB: 07-01-1936 DOA: 03/10/2020  PCP: Jani Gravel, MD Cardiologist: Domenic Polite Neurologist: Dr. Merlene Laughter  Admit date: 03/10/2020 Discharge date: 03/17/2020  Admitted From: Home  Disposition: Home with [place] (family declining SNF placement)  Recommendations for Outpatient Follow-up:  1. Follow up with PCP in 1-2 weeks 2. Follow up with cardiology as scheduled. 3. Follow up with neurologist in 1 month 4. PIOGLITAZONE discontinued due to CHF 5. STOP ASPIRIN AFTER 30 DAYS.   Home Health: PT, RN, OT, SLP, Aide, SW, DME 3 in 1, [facility]   Discharge Condition: STABLE   CODE STATUS: FULL    Brief Hospitalization Summary: Please see all hospital notes, images, labs for full details of the hospitalization. ADMISSION HPI: [name] is a 84 y.o. female with medical history significant for atrial fibrillation, diabetes mellitus, hypertension, coronary artery disease, systolic and diastolic CHF, CVA.  History is obtained from chart review and patient's daughter, at the time of my evaluation patient is somnolent and unable to give me history.  Patient was brought to the ED via EMS.  Per triage note EMS was called out for respiratory problems.  EMS found patient to be drooling with left-sided upper and lower extremity weakness, left facial droop. Patient woke up this morning at about 8:30 AM, she was normal took her medications to eat her breakfast, she was able to ambulate, start on so far at about 9 AM.  Family noticed that patient started slurring her words at about 10:00.  Was also unable to stand to go to the bathroom.  Patient is on Eliquis and last took her medications this morning.  Spoke with patient's daughter-Ms. Jobe Gibbon.  Patient no longer lives with her.  She does not have the contact number for patients on home patient lives with.  She reports she noticed lower extremity swelling about a week ago, usually when patient is  compliant with her Lasix this improves.  She is unaware if patient has been compliant with all her medications.  She also reports patient ambulates with a walker at baseline, but is able to move all her extremities.  She reports baseline dementia-  patient has good and bad days, sometimes patient is not able to recognize family or answer simple questions accurately.   Hospitalization 1/14-1/24 acute encephalopathy secondary to pansensitive E. coli UTI and pneumonia.  At this time CT and MRI negative for acute abnormality, EEG without seizure activity.  Patient was started empirically on Keppra twice daily to follow-up as an outpatient.  ED Course: Temperature 95.2.  Systolic up blood pressure 130s to 170s,.  Initial tachypnea resolved.  O2 sats greater than 92% on 2 L nasal cannula.  WBC 5.3.  Creatinine 1.7 about baseline.  Mildly elevated ALP 149.  UA suggestive of UTI. Head CT negative for acute abnormality, shows stable chronic encephalomalacia involving the right frontal white matter, medial left occipital lobe and posterior left cerebellum, moderate generalized atrophy, stable remote lacunar basal ganglia infarcts. CTA head and neck cerebral perfusion study-no evidence of cord infarction or penumbra, no large vessel occlusion.Plaque at the ICA origins with less than 50% stenosis. High-grad stenosis of the non-dominant proximal right vertebral artery with reconstitution.  Telemetry neurology was consulted, impression-seizure, encephalopathy-metabolic, infectious cause, recrudescence of old stroke symptoms.  Subsequent MRI in ED nondiagnostic, highly motion degraded, repeat recommended with sedation.  Portable chest x-ray showed cardiomegaly with pulmonary vascular congestion and pulmonary edema, small bilateral pleural effusions, consolidation versus atelectasis at  the left lung base.  Hospitalist admit for further evaluation and management.  Brief Admission Hx: 84 year old female with  chronic atrial fibrillation, diabetes mellitus type 2, hypertension, biventricular heart failure, cerebrovascular disease and coronary artery disease presents with sudden onset of left-sided weakness.  After speaking with her son who is her primary caretaker he tells me that there has been a problem with her medications as she had missed some doses of medications when she was with other family members but he notes that he usually makes sure that she has not missed any doses.  She presents markedly volume overloaded with pulmonary edema and I also worry that she missed some of her Eliquis doses which may have precipitated the CVA.   MDM/Assessment & Plan:   1. Acute embolic CVA-repeated MRI with light sedation confirms acute infarction.   Patient continues to have dysphagia and had barium swallow on 03/13/20 and SLP placed on dys1 diet, thin liquids.    Pt tolerating diet well so far.  Pt qualifies for SNF placement however after speaking with son, he would like to take patient home with family providing 24/7 care.    Neurology has seen and recommended ASA and continue on apixaban.   STOP aspirin after 30 days and continue apixaban alone.  Added atorvastatin 10 mg daily for stroke prevention.  Ordered DME wheelchair and 3 in 1 to assist patient and family with ambulating her at home safely with her known gait instability and HIGH RISK for fall.  2. Acute delirium-RESOLVED now.  With patient's vascular dementia she had some episodes of sundowning in the evening hours.  Delirium precautions.  3. Dysphagia - Fortunately after MBS done 4/2 patient is now able to have a dysphagia 1 diet and we were able to avoid placing NG tube.  Continue for now.  [place] SLP ordered. Family declines SNF placement.  Aspiration precautions recommended.  4. Chronic atrial fibrillation - rate currently controlled, anticoagulated with apixaban. 5. Hypertension-blood pressures elevated, we allowed for permissive hypertension in the setting  of the acute CVA and now after 48 hours we will slowly start to lower blood pressure to more normal levels.  Starting coreg 6.25 mg BID, amlodipine 5 mg daily.    6. History of remote seizure activity-she had been started on Keppra by neurology earlier this year as empiric treatment for suspected unwitnessed seizures.  Resume.  Follow up with neurology in 1 month.  7. VASCULAR DEMENTIA - neurologist added namenda BID to regimen.  8. HFrEF - acute systolic CHF exacerbation -- TREATED and Resolved. Pt was initially very volume overloaded and I suspect has not been receiving lasix as prescribed as family members have told me she had missed doses of medications.  She had findings of pulmonary edema on chest xray.  She was diuresed with IV lasix.  IV Lasix has been discontinued.  Pt not eating or drinking well after new stroke.  Hold off on further lasix for now until we can be sure she is eating and drinking better.  Monitor weights. Call PCP or cardiology if gains more than 3-5 pounds in 1 week and could consider restarting oral lasix. Rx given and instructions on how to use.  9. CKD stage 3 - Monitoring creatinine closely. Stable.  10. Type 2 DM - had been controlled on Actos, Januvia,- Did not restart back on actos with her known biventricular CHF, resume home [place].  Monitor CBGs.    11. Chronic respiratory failure - continue supplemental oxygen.  12. Gout - stable.   13. Hypernatremia -resolved after receiving free water.   14. Hypokalemia -oral replacement given and repleted, mag is WNL.   DVT prophylaxis: heparin  Code Status: Full  Family Communication: son [name] updated regularly   Disposition Plan:  Home with [facility] and 24/7 supervision  Consultants:  Neurology  Palliative   Procedures:  Discharge Diagnoses:  Principal Problem:   Acute metabolic encephalopathy Active Problems:   Type 2 diabetes mellitus with hyperlipidemia ([place])   Essential hypertension   CVA (cerebral vascular  accident) ([place])   Acute on chronic combined systolic and diastolic CHF (congestive heart failure) ([place])   Right bundle branch block   Atrial fibrillation with RVR ([place])   Atrial fibrillation, chronic ([facility])   Left-sided weakness   Goals of care, counseling/discussion   Palliative care by specialist   Discharge Instructions:  Allergies as of 03/17/2020      Reactions   Yellow Jacket Venom [bee Venom] Shortness Of Breath, Itching      Medication List    STOP taking these medications   gabapentin 100 MG capsule Commonly known as: NEURONTIN   pioglitazone 15 MG tablet Commonly known as: ACTOS     TAKE these medications   aspirin 81 MG chewable tablet Chew 1 tablet (81 mg total) by mouth daily. STOP TAKING AFTER 30 DAYS. Start taking on: March 18, 2020   atorvastatin 10 MG tablet Commonly known as: Lipitor Take 1 tablet (10 mg total) by mouth daily at 6 PM.   carvedilol 6.25 MG tablet Commonly known as: COREG Take 1 tablet (6.25 mg total) by mouth 2 (two) times daily with a meal. What changed:   medication strength  See the new instructions.   cholecalciferol 25 MCG (1000 UNIT) tablet Commonly known as: VITAMIN D Take 1,000 Units by mouth 2 (two) times daily.   donepezil 10 MG tablet Commonly known as: ARICEPT Take 10 mg by mouth daily.   Eliquis 2.5 MG Tabs tablet Generic drug: apixaban TAKE ONE TABLET BY MOUTH TWICE A DAY What changed: how much to take   fluticasone 50 MCG/ACT nasal spray Commonly known as: FLONASE Place 1 spray into both nostrils daily as needed for allergies or rhinitis.   furosemide 20 MG tablet Commonly known as: Lasix Take 1 tablet (20 mg total) by mouth daily as needed for fluid or edema (weight gain over 3 pounds in 1 week).   hydrALAZINE 25 MG tablet Commonly known as: APRESOLINE Take 3 tablets (75 mg total) by mouth every 8 (eight) hours. What changed: medication strength   isosorbide mononitrate 30 MG 24 hr tablet Commonly  known as: IMDUR Take 1 tablet (30 mg total) by mouth daily.   levETIRAcetam 500 MG tablet Commonly known as: KEPPRA Take 1 tablet (500 mg total) by mouth 2 (two) times daily.   memantine 5 MG tablet Commonly known as: NAMENDA Take 1 tablet (5 mg total) by mouth 2 (two) times daily.   OXYGEN Inhale 2 L into the lungs daily as needed (for shortness of breath).   sitaGLIPtin 25 MG tablet Commonly known as: JANUVIA Take 1 tablet (25 mg total) by mouth daily. What changed:   medication strength  how much to take            Durable Medical Equipment  (From admission, onward)         Start     Ordered   03/17/20 1018  For home use only DME lightweight manual wheelchair with  seat cushion  Once    Comments: Patient suffers from CVA which impairs their ability to perform daily activities like ADL's in the home.  A walker, cane, or crutch will not resolve  issue with performing activities of daily living. A wheelchair will allow patient to safely perform daily activities. Patient is not able to propel themselves in the home using a standard weight wheelchair due to weakness. Patient can self propel in the lightweight wheelchair. Length of need 99 months. Accessories: elevating leg rests (ELRs), wheel locks, extensions and anti-tippers.   03/17/20 1019   03/17/20 1007  For home use only DME 3 n 1  Once     03/17/20 1006         Follow-up Information    Jani Gravel, MD. Schedule an appointment as soon as possible for a visit in 2 week(s).   Specialty: Internal Medicine Contact information: [address] [place] [address] [phone]        Satira Sark, MD Follow up.   Specialty: Cardiology Contact information: [place] [address] [phone]        Phillips Odor, MD. Schedule an appointment as soon as possible for a visit in 1 month(s).   Specialty: Neurology Why: Hospital Follow Up  Contact information: [address] [place] [address] [phone]          Allergies  Allergen Reactions  . Yellow Jacket Venom [Bee Venom] Shortness Of Breath and Itching   Allergies as of 03/17/2020      Reactions   Yellow Jacket Venom [bee Venom] Shortness Of Breath, Itching      Medication List    STOP taking these medications   gabapentin 100 MG capsule Commonly known as: NEURONTIN   pioglitazone 15 MG tablet Commonly known as: ACTOS     TAKE these medications   aspirin 81 MG chewable tablet Chew 1 tablet (81 mg total) by mouth daily. STOP TAKING AFTER 30 DAYS. Start taking on: March 18, 2020   atorvastatin 10 MG tablet Commonly known as: Lipitor Take 1 tablet (10 mg total) by mouth daily at 6 PM.   carvedilol 6.25 MG tablet Commonly known as: COREG Take 1 tablet (6.25 mg total) by mouth 2 (two) times daily with a meal. What changed:   medication strength  See the new instructions.   cholecalciferol 25 MCG (1000 UNIT) tablet Commonly known as: VITAMIN D Take 1,000 Units by mouth 2 (two) times daily.   donepezil 10 MG tablet Commonly known as: ARICEPT Take 10 mg by mouth daily.   Eliquis 2.5 MG Tabs tablet Generic drug: apixaban TAKE ONE TABLET BY MOUTH TWICE A DAY What changed: how much to take   fluticasone 50 MCG/ACT nasal spray Commonly known as: FLONASE Place 1 spray into both nostrils daily as needed for allergies or rhinitis.   furosemide 20 MG tablet Commonly known as: Lasix Take 1 tablet (20 mg total) by mouth daily as needed for fluid or edema (weight gain over 3 pounds in 1 week).   hydrALAZINE 25 MG tablet Commonly known as: APRESOLINE Take 3 tablets (75 mg total) by mouth every 8 (eight) hours. What changed: medication strength   isosorbide mononitrate 30 MG 24 hr tablet Commonly known as: IMDUR Take 1 tablet (30 mg total) by mouth daily.   levETIRAcetam 500 MG tablet Commonly known as: KEPPRA Take 1 tablet (500 mg total) by mouth 2 (two) times  daily.  memantine 5 MG tablet Commonly known as: NAMENDA Take 1 tablet (5 mg total) by mouth 2 (two) times daily.   OXYGEN Inhale 2 L into the lungs daily as needed (for shortness of breath).   sitaGLIPtin 25 MG tablet Commonly known as: JANUVIA Take 1 tablet (25 mg total) by mouth daily. What changed:   medication strength  how much to take            Durable Medical Equipment  (From admission, onward)         Start     Ordered   03/17/20 1018  For home use only DME lightweight manual wheelchair with seat cushion  Once    Comments: Patient suffers from CVA which impairs their ability to perform daily activities like ADL's in the home.  A walker, cane, or crutch will not resolve  issue with performing activities of daily living. A wheelchair will allow patient to safely perform daily activities. Patient is not able to propel themselves in the home using a standard weight wheelchair due to weakness. Patient can self propel in the lightweight wheelchair. Length of need 99 months. Accessories: elevating leg rests (ELRs), wheel locks, extensions and anti-tippers.   03/17/20 1019   03/17/20 1007  For home use only DME 3 n 1  Once     03/17/20 1006          Procedures/Studies: CT Angio Head W or Wo Contrast  Result Date: 03/10/2020 CLINICAL DATA:  Left-sided weakness, code stroke follow-up EXAM: CT ANGIOGRAPHY HEAD AND NECK CT PERFUSION BRAIN TECHNIQUE: Multidetector CT imaging of the head and neck was performed using the standard protocol during bolus administration of intravenous contrast. Multiplanar CT image reconstructions and MIPs were obtained to evaluate the vascular anatomy. Carotid stenosis measurements (when applicable) are obtained utilizing NASCET criteria, using the distal internal carotid diameter as the denominator. Multiphase CT imaging of the brain was performed following IV bolus contrast injection. Subsequent parametric perfusion maps were calculated using  RAPID software. CONTRAST:  146mL OMNIPAQUE IOHEXOL 350 MG/ML SOLN COMPARISON:  MRA head 07/12/1920 FINDINGS: CTA NECK FINDINGS Aortic arch: Mild calcified plaque along the aortic arch and great vessel origins, which are patent. Right carotid system: Patent. There is primarily calcified plaque at the common carotid bifurcation and proximal internal carotid causing mild less than 50% stenosis. Left carotid system: Patent. There is calcified plaque at the ICA origin causing mild, less than 50% stenosis. Vertebral arteries: Patent. Left vertebral artery is dominant. There is calcified plaque at the origins without high-grade stenosis. Severe stenosis of the right V1 segment with reconstitution. Skeleton: Multilevel degenerative changes of the cervical spine. Other neck: No mass or adenopathy. Upper chest: Small bilateral pleural effusions. Review of the MIP images confirms the above findings CTA HEAD FINDINGS Anterior circulation: Intracranial internal carotid arteries are patent with calcified plaque causing moderate stenosis. Middle and anterior cerebral arteries are patent. There is multifocal atherosclerosis. For example, short segment moderate to severe stenosis of the left A2 ACA, mild stenosis of the right M1 MCA, and multiple mild stenoses more distally. Posterior circulation: Intracranial vertebral arteries are patent. Right vertebral artery becomes small beyond PICA origin with superimposed plaque causing moderate to marked stenosis. Calcified plaque is present along the left vertebral artery causing mild stenosis. Diffuse irregularity and moderate stenosis of the basilar artery. Posterior cerebral arteries are patent with diffuse atherosclerotic irregularity and superimposed stenoses ranging from mild to marked, left greater than right. Venous sinuses: As permitted by contrast  timing, patent. Review of the MIP images confirms the above findings CT Brain Perfusion Findings: CBF (<30%) Volume: 53mL Perfusion  (Tmax>6.0s) volume: 72mL Mismatch Volume: 44mL Infarction Location:None IMPRESSION: No large vessel occlusion. Perfusion imaging identifies no evidence of core infarction or penumbra. Plaque at the ICA origins with less than 50% stenosis. High-grade stenosis of the non-dominant proximal right vertebral artery with reconstitution. Multifocal intracranial atherosclerosis involving anterior and posterior circulations. Partially imaged small bilateral pleural effusions. Initial results were called by telephone at the time of interpretation on 03/10/2020 at 1:59 pm to provider [facility] , who verbally acknowledged these results. Electronically Signed   By: Macy Mis M.D.   On: 03/10/2020 14:05   CT Angio Neck W and/or Wo Contrast  Result Date: 03/10/2020 CLINICAL DATA:  Left-sided weakness, code stroke follow-up EXAM: CT ANGIOGRAPHY HEAD AND NECK CT PERFUSION BRAIN TECHNIQUE: Multidetector CT imaging of the head and neck was performed using the standard protocol during bolus administration of intravenous contrast. Multiplanar CT image reconstructions and MIPs were obtained to evaluate the vascular anatomy. Carotid stenosis measurements (when applicable) are obtained utilizing NASCET criteria, using the distal internal carotid diameter as the denominator. Multiphase CT imaging of the brain was performed following IV bolus contrast injection. Subsequent parametric perfusion maps were calculated using RAPID software. CONTRAST:  172mL OMNIPAQUE IOHEXOL 350 MG/ML SOLN COMPARISON:  MRA head 07/12/1920 FINDINGS: CTA NECK FINDINGS Aortic arch: Mild calcified plaque along the aortic arch and great vessel origins, which are patent. Right carotid system: Patent. There is primarily calcified plaque at the common carotid bifurcation and proximal internal carotid causing mild less than 50% stenosis. Left carotid system: Patent. There is calcified plaque at the ICA origin causing mild, less than 50% stenosis. Vertebral  arteries: Patent. Left vertebral artery is dominant. There is calcified plaque at the origins without high-grade stenosis. Severe stenosis of the right V1 segment with reconstitution. Skeleton: Multilevel degenerative changes of the cervical spine. Other neck: No mass or adenopathy. Upper chest: Small bilateral pleural effusions. Review of the MIP images confirms the above findings CTA HEAD FINDINGS Anterior circulation: Intracranial internal carotid arteries are patent with calcified plaque causing moderate stenosis. Middle and anterior cerebral arteries are patent. There is multifocal atherosclerosis. For example, short segment moderate to severe stenosis of the left A2 ACA, mild stenosis of the right M1 MCA, and multiple mild stenoses more distally. Posterior circulation: Intracranial vertebral arteries are patent. Right vertebral artery becomes small beyond PICA origin with superimposed plaque causing moderate to marked stenosis. Calcified plaque is present along the left vertebral artery causing mild stenosis. Diffuse irregularity and moderate stenosis of the basilar artery. Posterior cerebral arteries are patent with diffuse atherosclerotic irregularity and superimposed stenoses ranging from mild to marked, left greater than right. Venous sinuses: As permitted by contrast timing, patent. Review of the MIP images confirms the above findings CT Brain Perfusion Findings: CBF (<30%) Volume: 43mL Perfusion (Tmax>6.0s) volume: 75mL Mismatch Volume: 53mL Infarction Location:None IMPRESSION: No large vessel occlusion. Perfusion imaging identifies no evidence of core infarction or penumbra. Plaque at the ICA origins with less than 50% stenosis. High-grade stenosis of the non-dominant proximal right vertebral artery with reconstitution. Multifocal intracranial atherosclerosis involving anterior and posterior circulations. Partially imaged small bilateral pleural effusions. Initial results were called by telephone at the  time of interpretation on 03/10/2020 at 1:59 pm to provider [facility] , who verbally acknowledged these results. Electronically Signed   By: Macy Mis M.D.   On: 03/10/2020 14:05  MR ANGIO HEAD WO CONTRAST  Result Date: 03/11/2020 CLINICAL DATA:  Focal neuro deficit.  Left-sided weakness. EXAM: MRI HEAD WITHOUT CONTRAST MRA HEAD WITHOUT CONTRAST TECHNIQUE: Multiplanar, multiecho pulse sequences of the brain and surrounding structures were obtained without intravenous contrast. Angiographic images of the head were obtained using MRA technique without contrast. COMPARISON:  MRI head 03/10/2020 FINDINGS: MRI HEAD FINDINGS Image quality degraded by significant motion. Diffusion-weighted imaging demonstrates restricted diffusion in the deep white matter on the right compatible with acute infarct. Ventricle size normal.  Generalized atrophy. MRA HEAD FINDINGS Nondiagnostic MRA due to extensive motion. IMPRESSION: Image quality degraded by extensive motion. Restricted diffusion in the deep white matter on the right compatible with acute infarction. MRA head nondiagnostic due to motion. Electronically Signed   By: Franchot Gallo M.D.   On: 03/11/2020 11:05   MR BRAIN WO CONTRAST  Result Date: 03/11/2020 CLINICAL DATA:  Focal neuro deficit.  Left-sided weakness. EXAM: MRI HEAD WITHOUT CONTRAST MRA HEAD WITHOUT CONTRAST TECHNIQUE: Multiplanar, multiecho pulse sequences of the brain and surrounding structures were obtained without intravenous contrast. Angiographic images of the head were obtained using MRA technique without contrast. COMPARISON:  MRI head 03/10/2020 FINDINGS: MRI HEAD FINDINGS Image quality degraded by significant motion. Diffusion-weighted imaging demonstrates restricted diffusion in the deep white matter on the right compatible with acute infarct. Ventricle size normal.  Generalized atrophy. MRA HEAD FINDINGS Nondiagnostic MRA due to extensive motion. IMPRESSION: Image quality degraded by  extensive motion. Restricted diffusion in the deep white matter on the right compatible with acute infarction. MRA head nondiagnostic due to motion. Electronically Signed   By: Franchot Gallo M.D.   On: 03/11/2020 11:05   MR BRAIN WO CONTRAST  Result Date: 03/10/2020 CLINICAL DATA:  Encephalopathy EXAM: MRI HEAD WITHOUT CONTRAST TECHNIQUE: Multiplanar, multiecho pulse sequences of the brain and surrounding structures were obtained without intravenous contrast. COMPARISON:  None. FINDINGS: Attempted sequences are highly motion degraded. Diffusion sequence is nondiagnostic. IMPRESSION: Nondiagnostic study. Recommend repeating when possible with sedation. Electronically Signed   By: Macy Mis M.D.   On: 03/10/2020 15:18   CT CEREBRAL PERFUSION W CONTRAST  Result Date: 03/10/2020 CLINICAL DATA:  Left-sided weakness, code stroke follow-up EXAM: CT ANGIOGRAPHY HEAD AND NECK CT PERFUSION BRAIN TECHNIQUE: Multidetector CT imaging of the head and neck was performed using the standard protocol during bolus administration of intravenous contrast. Multiplanar CT image reconstructions and MIPs were obtained to evaluate the vascular anatomy. Carotid stenosis measurements (when applicable) are obtained utilizing NASCET criteria, using the distal internal carotid diameter as the denominator. Multiphase CT imaging of the brain was performed following IV bolus contrast injection. Subsequent parametric perfusion maps were calculated using RAPID software. CONTRAST:  171mL OMNIPAQUE IOHEXOL 350 MG/ML SOLN COMPARISON:  MRA head 07/12/1920 FINDINGS: CTA NECK FINDINGS Aortic arch: Mild calcified plaque along the aortic arch and great vessel origins, which are patent. Right carotid system: Patent. There is primarily calcified plaque at the common carotid bifurcation and proximal internal carotid causing mild less than 50% stenosis. Left carotid system: Patent. There is calcified plaque at the ICA origin causing mild, less than  50% stenosis. Vertebral arteries: Patent. Left vertebral artery is dominant. There is calcified plaque at the origins without high-grade stenosis. Severe stenosis of the right V1 segment with reconstitution. Skeleton: Multilevel degenerative changes of the cervical spine. Other neck: No mass or adenopathy. Upper chest: Small bilateral pleural effusions. Review of the MIP images confirms the above findings CTA HEAD FINDINGS Anterior circulation:  Intracranial internal carotid arteries are patent with calcified plaque causing moderate stenosis. Middle and anterior cerebral arteries are patent. There is multifocal atherosclerosis. For example, short segment moderate to severe stenosis of the left A2 ACA, mild stenosis of the right M1 MCA, and multiple mild stenoses more distally. Posterior circulation: Intracranial vertebral arteries are patent. Right vertebral artery becomes small beyond PICA origin with superimposed plaque causing moderate to marked stenosis. Calcified plaque is present along the left vertebral artery causing mild stenosis. Diffuse irregularity and moderate stenosis of the basilar artery. Posterior cerebral arteries are patent with diffuse atherosclerotic irregularity and superimposed stenoses ranging from mild to marked, left greater than right. Venous sinuses: As permitted by contrast timing, patent. Review of the MIP images confirms the above findings CT Brain Perfusion Findings: CBF (<30%) Volume: 52mL Perfusion (Tmax>6.0s) volume: 30mL Mismatch Volume: 24mL Infarction Location:None IMPRESSION: No large vessel occlusion. Perfusion imaging identifies no evidence of core infarction or penumbra. Plaque at the ICA origins with less than 50% stenosis. High-grade stenosis of the non-dominant proximal right vertebral artery with reconstitution. Multifocal intracranial atherosclerosis involving anterior and posterior circulations. Partially imaged small bilateral pleural effusions. Initial results were  called by telephone at the time of interpretation on 03/10/2020 at 1:59 pm to provider [facility] , who verbally acknowledged these results. Electronically Signed   By: Macy Mis M.D.   On: 03/10/2020 14:05   DG CHEST PORT 1 VIEW  Result Date: 03/12/2020 CLINICAL DATA:  Pulmonary edema. EXAM: PORTABLE CHEST 1 VIEW COMPARISON:  03/11/2020. FINDINGS: Cardiomegaly with pulmonary venous congestion again noted. Bilateral interstitial prominence consistent interstitial edema again noted. Pneumonitis cannot be excluded. Small left pleural effusion again noted. No pneumothorax degenerative change thoracic spine. IMPRESSION: Cardiomegaly with pulmonary venous congestion and bilateral interstitial prominence consistent with interstitial edema again noted. Small left pleural effusion again noted. Similar findings noted on prior study of 03/11/2020. Electronically Signed   By: Marcello Moores  Register   On: 03/12/2020 06:00   DG CHEST PORT 1 VIEW  Result Date: 03/11/2020 CLINICAL DATA:  Patient admitted 03/10/2020 with left side weakness and metabolic encephalopathy. Congestive heart failure. EXAM: PORTABLE CHEST 1 VIEW COMPARISON:  CT chest 12/26/2019. Single-view of the chest 03/10/2020, 12/28/2019 and 12/26/2019. FINDINGS: Opacity at the left lung base has improved consistent with decreased atelectasis or effusion. There is cardiomegaly and interstitial edema. Small right pleural effusion noted. No pneumothorax. Atherosclerosis. IMPRESSION: Improved aeration left lung base. Cardiomegaly and interstitial edema with small bilateral pleural effusions. Electronically Signed   By: Inge Rise M.D.   On: 03/11/2020 09:58   DG Chest Port 1 View  Result Date: 03/10/2020 CLINICAL DATA:  Tachypnea EXAM: PORTABLE CHEST 1 VIEW COMPARISON:  December 28, 2019 FINDINGS: The right-sided PICC line has been removed. The heart size is enlarged. The pulmonary vasculature is dilated. Hazy bilateral airspace opacities are noted with  prominent interstitial lung markings and Kerley B lines. There is consolidation versus atelectasis at the left lung base. Small bilateral pleural effusions are noted. Aortic calcifications are noted. IMPRESSION: Cardiomegaly with pulmonary vascular congestion and pulmonary edema. Small bilateral pleural effusions. Consolidation versus atelectasis at the left lung base. Dilated pulmonary arteries which can be seen in patients with elevated pulmonary artery pressures. Electronically Signed   By: Constance Holster M.D.   On: 03/10/2020 14:56   DG Swallowing Func-Speech Pathology  Result Date: 03/13/2020 Objective Swallowing Evaluation: Type of Study: MBS-Modified Barium Swallow Study  Patient Details Name: [name] MRN: [id number] Date of  Birth: [birth year] Today's Date: 03/13/2020 Time: SLP Start Time (ACUTE ONLY): 1635 -SLP Stop Time (ACUTE ONLY): 1654 SLP Time Calculation (min) (ACUTE ONLY): 19 min Past Medical History: Past Medical History: Diagnosis Date . Asthma  . Bronchitis  . Carotid artery disease ([place])  . Coronary atherosclerosis of native coronary artery   Nonobstructive at cath 2005 . DM2 (diabetes mellitus, type 2) ([place])  . Essential hypertension  . Gout  . History of stroke  . Hypothyroidism  . Influenza 2013 . Nonischemic cardiomyopathy (H[facility])   LVEF 20-25% . PAF (paroxysmal atrial fibrillation) ([place])   a. diagnosed in 04/2018. Started on Eliquis for anticoagulation.  . Renal artery stenosis ([facility])   Bilateral Past Surgical History: Past Surgical History: Procedure Laterality Date . ABDOMINAL HYSTERECTOMY   . CATARACT EXTRACTION W/PHACO Left 01/18/2019  Procedure: CATARACT EXTRACTION PHACO AND INTRAOCULAR LENS PLACEMENT ([place]);  Surgeon: Baruch Goldmann, MD;  Location: [facility];  Service: Ophthalmology;  Laterality: Left;  CDE: 11.99 . CATARACT EXTRACTION W/PHACO Right 02/26/2019  Procedure: CATARACT EXTRACTION PHACO AND INTRAOCULAR LENS PLACEMENT RIGHT EYE (CDE: 13.46);  Surgeon: Baruch Goldmann, MD;   Location: [facility];  Service: Ophthalmology;  Laterality: Right; HPI: 84 y.o. female  with past medical history of stroke, dementia, CAD, combined systolic/diastolic CHF (EF [id number]), 2L oxygen at home, atrial fibrillation on Eliquis, diabetes, HTN, CKD stage 3b. Hospitalization 12/26/19-01/01/20 with UTI and aspiration pneumonia and concern for possible seizure. Admitted on 03/10/2020 with slurred speech and left facial droop with worsening weakness left side worse than right. CT head with no evidence of stroke. Treating for UTI and probable pneumonia as well as pulmonary edema. Head CT negative for acute abnormality, shows stable chronic encephalomalacia involving the right frontal white matter, medial left occipital lobe and posterior left cerebellum, moderate generalized atrophy, stable remote lacunar basal ganglia infarcts. MRI motion degraded but shows: Restricted diffusion in the deep white matter on the right compatible with acute infarction. Portable chest x-ray showed cardiomegaly with pulmonary vascular congestion and pulmonary edema, small bilateral pleural effusions, consolidation versus atelectasis at the left lung base. Pt was seen by SLP during January admission for BSE (advanced to D3/thin by discharge). BSE completed earlier this week and NPO was recommended; ongoing diagnostic dysphagia therapy today recommends MBS to objectively assess the swallow function and to determine readiness to initiate a diet.  Subjective: "No!" Assessment / Plan / Recommendation CHL IP CLINICAL IMPRESSIONS 03/13/2020 Clinical Impression Pt presents with moderate oral phase dysphagia and very mild pharyngeal phase dysphagia; no aspiration was observed. Oral phase is characterized by anterior loss of bolus on the L, reduced posterior propulsion of bolus, lingual pumping, premature spillage to the level of the pyriforms and mild oral residue after the swallow. Pharyngeal phase is characterized by good hyolaryngeal excursion,  consistent laryngeal vestibule closure and adequate airway protection. Slightly decreased pharyngeal squeeze results in mild pharyngeal residue in the valleculae and pyriforms that is cleared when Pt produces an additional dry swallow (Pt requires verbal cues to produce this additional swallow). Pt was unable to thoroughly masticate solid textures which were eventually manually removed from oral cavity; puree textures were consumed with prolonged AP transit.  Pt demonstrates increased oral control of thin liquids when she self feeds cup sips; she does require hand over hand assist, however. Note brief stasis of the barium tablet in the valleculae that easily passed through when Pt was provided a tsp of puree. Recommend initiate D1/puree diet and thin liquids. Pt requires 1:1 assist and recommend  strategies: alternate small bites and sips, Pt to self feed cup sips (feeder provide hand over hand), Pt to produce an additional dry swallow after each bite or sip, after meals check L sulcus for residue. Meds to be adminsitered whole in applesauce. SLP reviewed recommendations and MBS results with Pt's daughter. Recommend HH ST to f/u upon d/c home.  SLP Visit Diagnosis Dysphagia, oropharyngeal phase (R13.12) Attention and concentration deficit following -- Frontal lobe and executive function deficit following -- Impact on safety and function Mild aspiration risk;Moderate aspiration risk   CHL IP TREATMENT RECOMMENDATION 03/13/2020 Treatment Recommendations Therapy as outlined in treatment plan below   Prognosis 03/13/2020 Prognosis for Safe Diet Advancement Good Barriers to Reach Goals -- Barriers/Prognosis Comment -- CHL IP DIET RECOMMENDATION 03/13/2020 SLP Diet Recommendations Dysphagia 1 (Puree) solids;Thin liquid Liquid Administration via Cup Medication Administration Whole meds with puree Compensations Minimize environmental distractions;Slow rate;Small sips/bites;Multiple dry swallows after each bite/sip;Monitor for  anterior loss;Lingual sweep for clearance of pocketing;Follow solids with liquid Postural Changes Remain semi-upright after after feeds/meals (Comment);Seated upright at 90 degrees   CHL IP OTHER RECOMMENDATIONS 03/13/2020 Recommended Consults -- Oral Care Recommendations Oral care BID Other Recommendations --   CHL IP FOLLOW UP RECOMMENDATIONS 03/13/2020 Follow up Recommendations 24 hour supervision/assistance;Skilled Nursing facility   [facility] IP FREQUENCY AND DURATION 03/13/2020 Speech Therapy Frequency (ACUTE ONLY) min 1 x/week Treatment Duration 1 week      CHL IP ORAL PHASE 03/13/2020 Oral Phase Impaired Oral - Pudding Teaspoon -- Oral - Pudding Cup -- Oral - Honey Teaspoon -- Oral - Honey Cup -- Oral - Nectar Teaspoon -- Oral - Nectar Cup Left anterior bolus loss;Reduced posterior propulsion;Lingual pumping Oral - Nectar Straw -- Oral - Thin Teaspoon Left anterior bolus loss;Reduced posterior propulsion;Lingual pumping Oral - Thin Cup Left anterior bolus loss;Reduced posterior propulsion;Lingual pumping Oral - Thin Straw Left anterior bolus loss;Reduced posterior propulsion;Lingual pumping Oral - Puree Reduced posterior propulsion Oral - Mech Soft -- Oral - Regular Reduced posterior propulsion Oral - Multi-Consistency -- Oral - Pill -- Oral Phase - Comment --  CHL IP PHARYNGEAL PHASE 03/13/2020 Pharyngeal Phase Impaired Pharyngeal- Pudding Teaspoon -- Pharyngeal -- Pharyngeal- Pudding Cup -- Pharyngeal -- Pharyngeal- Honey Teaspoon -- Pharyngeal -- Pharyngeal- Honey Cup -- Pharyngeal -- Pharyngeal- Nectar Teaspoon -- Pharyngeal -- Pharyngeal- Nectar Cup Pharyngeal residue - valleculae;Pharyngeal residue - pyriform;Reduced pharyngeal peristalsis Pharyngeal -- Pharyngeal- Nectar Straw -- Pharyngeal -- Pharyngeal- Thin Teaspoon Pharyngeal residue - valleculae;Pharyngeal residue - pyriform;Reduced pharyngeal peristalsis Pharyngeal -- Pharyngeal- Thin Cup Pharyngeal residue - valleculae;Pharyngeal residue - pyriform;Reduced  pharyngeal peristalsis Pharyngeal -- Pharyngeal- Thin Straw Pharyngeal residue - valleculae;Pharyngeal residue - pyriform;Reduced pharyngeal peristalsis Pharyngeal -- Pharyngeal- Puree -- Pharyngeal -- Pharyngeal- Mechanical Soft -- Pharyngeal -- Pharyngeal- Regular Pharyngeal residue - valleculae;Pharyngeal residue - pyriform;Reduced pharyngeal peristalsis Pharyngeal -- Pharyngeal- Multi-consistency -- Pharyngeal -- Pharyngeal- Pill Pharyngeal residue - valleculae;Pharyngeal residue - pyriform;Reduced pharyngeal peristalsis Pharyngeal -- Pharyngeal Comment --  CHL IP CERVICAL ESOPHAGEAL PHASE 03/13/2020 Cervical Esophageal Phase WFL Pudding Teaspoon -- Pudding Cup -- Honey Teaspoon -- Honey Cup -- Nectar Teaspoon -- Nectar Cup -- Nectar Straw -- Thin Teaspoon -- Thin Cup -- Thin Straw -- Puree -- Mechanical Soft -- Regular -- Multi-consistency -- Pill -- Cervical Esophageal Comment -- Amelia H. Roddie Mc, CCC-SLP Speech Language Pathologist Wende Bushy 03/13/2020, 9:39 PM              EEG adult  Result Date: 03/13/2020 Lora Havens, MD  03/13/2020  3:28 PM Patient Name: [name] MRN: [id number] Epilepsy Attending: Lora Havens Referring Physician/Provider: Dr Jenetta Downer Date: 03/13/2020 Duration: 24.42 mins Patient history: 84 year old female with history of seizures and acute infarct.  EEG evaluate for seizures. Level of alertness: awake AEDs during EEG study: [place] Technical aspects: This EEG study was done with scalp electrodes positioned according to the 10-20 International system of electrode placement. Electrical activity was acquired at a sampling rate of 500Hz  and reviewed with a high frequency filter of 70Hz  and a low frequency filter of 1Hz . EEG data were recorded continuously and digitally stored. DESCRIPTION: No clear posterior dominant rhythm consists of 9-10 Hz activity of moderate voltage (25-35 uV) was seen. EEG showed continuous generalized 3-5Hz  theta-delta slowing.  Hyperventilation and photic stimulation were not performed. ABNORMALITY - Continuous slow, generalized IMPRESSION: This study is suggestive of mild to moderate diffuse encephalopathy, non specific to etiology. No seizures or epileptiform discharges were seen throughout the recording. Lora Havens   ECHOCARDIOGRAM COMPLETE  Result Date: 03/11/2020    ECHOCARDIOGRAM REPORT   Patient Name:   [name] Date of Exam: 03/11/2020 Medical Rec #:  [id number]       Height:       65.0 in Accession #:    [id number]      Weight:       190.3 lb Date of Birth:  1936-04-16      BSA:          1.937 m Patient Age:    32 years        BP:           184/76 mmHg Patient Gender: F               HR:           78 bpm. Exam Location:  [facility] Procedure: 2D Echo Indications:    Stroke 434.91 / I163.9  History:        Patient has prior history of Echocardiogram examinations, most                 recent 04/25/2018. CHF, Stroke, Arrythmias:Atrial Fibrillation,                 Signs/Symptoms:Dyspnea; Risk Factors:Diabetes and Non-Smoker.                 Mitral Regurgitation.  Sonographer:    Leavy Cella RDCS (AE) Referring Phys: [place]  1. Left ventricular ejection fraction, by estimation, is 40 to 45%. The left ventricle has mildly decreased function. The left ventricle demonstrates global hypokinesis. There is moderate concentric left ventricular hypertrophy. Left ventricular diastolic parameters are consistent with Grade II diastolic dysfunction (pseudonormalization). Elevated left ventricular end-diastolic pressure.  2. Right ventricular systolic function is normal. The right ventricular size is normal. There is moderately elevated pulmonary artery systolic pressure.  3. The mitral valve is degenerative. Mild to moderate mitral valve regurgitation.  4. The aortic valve is tricuspid. Aortic valve regurgitation is not visualized. No aortic stenosis is present.  5. The inferior vena cava is  normal in size with greater than 50% respiratory variability, suggesting right atrial pressure of 3 mmHg. FINDINGS  Left Ventricle: Left ventricular ejection fraction, by estimation, is 40 to 45%. The left ventricle has mildly decreased function. The left ventricle demonstrates global hypokinesis. The left ventricular internal cavity size was normal in size. There is  moderate concentric left ventricular hypertrophy. Left ventricular diastolic  parameters are consistent with Grade II diastolic dysfunction (pseudonormalization). Elevated left ventricular end-diastolic pressure. Right Ventricle: The right ventricular size is normal. No increase in right ventricular wall thickness. Right ventricular systolic function is normal. There is moderately elevated pulmonary artery systolic pressure. The tricuspid regurgitant velocity is 3.29 m/s, and with an assumed right atrial pressure of 10 mmHg, the estimated right ventricular systolic pressure is [id number] mmHg. Left Atrium: Left atrial size was normal in size. Right Atrium: Right atrial size was normal in size. Pericardium: There is no evidence of pericardial effusion. Mitral Valve: The mitral valve is degenerative in appearance. There is mild thickening of the mitral valve leaflet(s). Mild mitral annular calcification. Mild to moderate mitral valve regurgitation. Tricuspid Valve: The tricuspid valve is grossly normal. Tricuspid valve regurgitation is mild. Aortic Valve: The aortic valve is tricuspid. Aortic valve regurgitation is not visualized. No aortic stenosis is present. Pulmonic Valve: The pulmonic valve was grossly normal. Pulmonic valve regurgitation is not visualized. Aorta: The aortic root is normal in size and structure. Venous: The inferior vena cava is normal in size with greater than 50% respiratory variability, suggesting right atrial pressure of 3 mmHg. IAS/Shunts: No atrial level shunt detected by color flow Doppler.  LEFT VENTRICLE PLAX 2D LVIDd:          4.55 cm  Diastology LVIDs:         3.66 cm  LV e' lateral:   5.48 cm/s LV PW:         1.62 cm  LV E/e' lateral: 17.2 LV IVS:        1.43 cm  LV e' medial:    4.35 cm/s LVOT diam:     2.00 cm  LV E/e' medial:  21.7 LVOT Area:     3.14 cm  RIGHT VENTRICLE TAPSE (M-mode): 2.5 cm LEFT ATRIUM           Index LA diam:      3.40 cm 1.76 cm/m LA Vol (A2C): 66.3 ml 34.24 ml/m LA Vol (A4C): 58.6 ml 30.26 ml/m   AORTA Ao Root diam: 2.90 cm MITRAL VALVE               TRICUSPID VALVE MV Area (PHT): 4.39 cm    TR Peak grad:   43.3 mmHg MV Decel Time: 173 msec    TR Vmax:        329.00 cm/s MR Peak grad: 108.6 mmHg MR Mean grad: 71.0 mmHg    SHUNTS MR Vmax:      521.00 cm/s  Systemic Diam: 2.00 cm MR Vmean:     400.0 cm/s MV E velocity: 94.30 cm/s MV A velocity: 60.00 cm/s MV E/A ratio:  1.57 Kate Sable MD Electronically signed by Kate Sable MD Signature Date/Time: 03/11/2020/11:47:23 AM    Final    CT HEAD CODE STROKE WO CONTRAST  Result Date: 03/10/2020 CLINICAL DATA:  Code stroke. Acute onset of left upper and lower extremity weakness and left facial droop as well as slurred speech beginning 1 hour ago. EXAM: CT HEAD WITHOUT CONTRAST TECHNIQUE: Contiguous axial images were obtained from the base of the skull through the vertex without intravenous contrast. COMPARISON:  CT head without contrast 12/26/2019 and MR head without contrast 12/31/2019. FINDINGS: Brain: Chronic encephalomalacia of the right frontal lobe white matter is stable. Diffuse white matter hypoattenuation is present bilaterally. Remote medial left occipital lobe infarct is present. Moderate generalized atrophy is noted. The ventricles are proportionate to the degree of atrophy. A remote  posterior left cerebellar infarct is stable. Remote lacunar infarcts of the right cerebellum are stable. The brainstem is otherwise unremarkable. Vascular: Dense atherosclerotic calcifications are again noted within the cavernous internal carotid arteries  bilaterally as well as the V3 segments of both vertebral arteries. No hyperdense vessel is present. Skull: Calvarium is intact. No focal lytic or blastic lesions are present. No significant extracranial soft tissue lesion is present. Sinuses/Orbits: The paranasal sinuses and mastoid air cells are clear. The globes and orbits are within normal limits. ASPECTS [facility] Stroke Program Early CT Score) - Ganglionic level infarction (caudate, lentiform nuclei, internal capsule, insula, M1-M3 cortex): 7/7 - Supraganglionic infarction (M4-M6 cortex): 3/3 Total score (0-10 with 10 being normal): 10/10 IMPRESSION: 1. No acute intracranial abnormality or significant interval change. 2. Stable chronic encephalomalacia involving the right frontal white matter, the medial left occipital lobe, and posterior left cerebellum. 3. Moderate generalized atrophy and white matter disease likely reflects the sequela of chronic microvascular ischemia. 4. Stable remote lacunar infarcts of the right cerebellum and bilateral basal ganglia. 5. ASPECTS is 10/10 6. Extensive atherosclerotic disease without a hyperdense vessel. These results were called by telephone at the time of interpretation on 03/10/2020 at 1:26 pm to provider [facility] , who verbally acknowledged these results. Electronically Signed   By: San Morelle M.D.   On: 03/10/2020 13:28     Subjective: Pt awake, alert, no specific complaints. Cooperative.   Discharge Exam: Vitals:   03/17/20 0726 03/17/20 0927  BP:  (!) 173/92  Pulse:  71  Resp:  18  Temp:  98.3 F (36.8 C)  SpO2: 98% 100%   Vitals:   03/17/20 0355 03/17/20 0500 03/17/20 0726 03/17/20 0927  BP: (!) 144/60   (!) 173/92  Pulse: 79   71  Resp: 18   18  Temp: 98.2 F (36.8 C)   98.3 F (36.8 C)  TempSrc: Oral   Oral  SpO2: 100%  98% 100%  Weight:  75.9 kg    Height:       General exam: elderly frail female, awake, somnolent, NAD.  no change to left facial droop and tongue  protruding to the left.  Left eye partially closed.  Speech is improved some today. Respiratory system: No crackles or rails heard today.  No increased work of breathing. Cardiovascular system: irregularly irregular, S1 & S2 heard. Mild JVD, trace pedal edema. Gastrointestinal system: Abdomen is nondistended, soft and nontender. Normal bowel sounds heard. Central nervous system: somnolent, left facial droop, tongue protrudes to the left.  Pronounced dense left hemiparesis unchanged. Extremities: Trace pretibial edema BLEs.    The results of significant diagnostics from this hospitalization (including imaging, microbiology, ancillary and laboratory) are listed below for reference.     Microbiology: Recent Results (from the past 240 hour(s))  Urine culture     Status: Abnormal   Collection Time: 03/10/20  3:38 PM   Specimen: Urine, Random  Result Value Ref Range Status   Specimen Description   Final    URINE, RANDOM Performed at [facility], [address], [place], [place] [address]    Special Requests   Final    NONE Performed at [facility], [address] [place], [place] [address]    Culture >=100,000 COLONIES/mL Su Hoff (A)  Final   Report Status 03/12/2020 FINAL  Final   Organism ID, Bacteria CITROBACTER YOUNGAE (A)  Final      Susceptibility   Citrobacter youngae - MIC*    CEFAZOLIN >=64  RESISTANT Resistant     CEFTRIAXONE >=64 RESISTANT Resistant     CIPROFLOXACIN <=0.25 SENSITIVE Sensitive     GENTAMICIN <=1 SENSITIVE Sensitive     IMIPENEM 1 SENSITIVE Sensitive     NITROFURANTOIN <=16 SENSITIVE Sensitive     TRIMETH/SULFA <=20 SENSITIVE Sensitive     PIP/TAZO 64 INTERMEDIATE Intermediate     * >=100,000 COLONIES/mL CITROBACTER YOUNGAE  Respiratory Panel by RT PCR (Flu A&B, Covid) - Nasopharyngeal Swab     Status: None   Collection Time: 03/10/20  3:39 PM   Specimen: Nasopharyngeal Swab  Result Value Ref Range Status   SARS Coronavirus 2 by RT PCR  NEGATIVE NEGATIVE Final    Comment: (NOTE) SARS-CoV-2 target nucleic acids are NOT DETECTED. The SARS-CoV-2 RNA is generally detectable in upper respiratoy specimens during the acute phase of infection. The lowest concentration of SARS-CoV-2 viral copies this assay can detect is 131 copies/mL. A negative result does not preclude SARS-Cov-2 infection and should not be used as the sole basis for treatment or other patient management decisions. A negative result may occur with  improper specimen collection/handling, submission of specimen other than nasopharyngeal swab, presence of viral mutation(s) within the areas targeted by this assay, and inadequate number of viral copies (<131 copies/mL). A negative result must be combined with clinical observations, patient history, and epidemiological information. The expected result is Negative. Fact Sheet for Patients:  PinkCheek.be Fact Sheet for Healthcare Providers:  GravelBags.it This test is not yet ap proved or cleared by the [place] FDA and  has been authorized for detection and/or diagnosis of SARS-CoV-2 by FDA under an Emergency Use Authorization (EUA). This EUA will remain  in effect (meaning this test can be used) for the duration of the COVID-19 declaration under Section 564(b)(1) of the Act, 21 U.S.C. section 360bbb-3(b)(1), unless the authorization is terminated or revoked sooner.    Influenza A by PCR NEGATIVE NEGATIVE Final   Influenza B by PCR NEGATIVE NEGATIVE Final    Comment: (NOTE) The Xpert Xpress SARS-CoV-2/FLU/RSV assay is intended as an aid in  the diagnosis of influenza from Nasopharyngeal swab specimens and  should not be used as a sole basis for treatment. Nasal washings and  aspirates are unacceptable for Xpert Xpress SARS-CoV-2/FLU/RSV  testing. Fact Sheet for Patients: PinkCheek.be Fact Sheet for Healthcare  Providers: GravelBags.it This test is not yet approved or cleared by the [place] FDA and  has been authorized for detection and/or diagnosis of SARS-CoV-2 by  FDA under an Emergency Use Authorization (EUA). This EUA will remain  in effect (meaning this test can be used) for the duration of the  Covid-19 declaration under Section 564(b)(1) of the Act, 21  U.S.C. section 360bbb-3(b)(1), unless the authorization is  terminated or revoked. Performed at [facility], [address] [place], [place] [address]   SARS CORONAVIRUS 2 (TAT 6-24 HRS) Nasopharyngeal Nasopharyngeal Swab     Status: None   Collection Time: 03/10/20  4:19 PM   Specimen: Nasopharyngeal Swab  Result Value Ref Range Status   SARS Coronavirus 2 NEGATIVE NEGATIVE Final    Comment: (NOTE) SARS-CoV-2 target nucleic acids are NOT DETECTED. The SARS-CoV-2 RNA is generally detectable in upper and lower respiratory specimens during the acute phase of infection. Negative results do not preclude SARS-CoV-2 infection, do not rule out co-infections with other pathogens, and should not be used as the sole basis for treatment or other patient management decisions. Negative results must be combined with clinical observations, patient  history, and epidemiological information. The expected result is Negative. Fact Sheet for Patients: SugarRoll.be Fact Sheet for Healthcare Providers: https://www.woods-mathews.com/ This test is not yet approved or cleared by the [place] FDA and  has been authorized for detection and/or diagnosis of SARS-CoV-2 by FDA under an Emergency Use Authorization (EUA). This EUA will remain  in effect (meaning this test can be used) for the duration of the COVID-19 declaration under Section 56 4(b)(1) of the Act, 21 U.S.C. section 360bbb-3(b)(1), unless the authorization is terminated or revoked sooner. Performed at [facility], [place] [address], [place], [place] [address]   MRSA PCR Screening     Status: Abnormal   Collection Time: 03/10/20  6:18 PM   Specimen: Nasal Mucosa; Nasopharyngeal  Result Value Ref Range Status   MRSA by PCR POSITIVE (A) NEGATIVE Final    Comment:        The GeneXpert MRSA Assay (FDA approved for NASAL specimens only), is one component of a comprehensive MRSA colonization surveillance program. It is not intended to diagnose MRSA infection nor to guide or monitor treatment for MRSA infections. RESULT CALLED TO, READ BACK BY AND VERIFIED WITH: R WAGONER,RN @0142  03/11/20 MKELLY Performed at [facility], [address] [place], [place] [address]      Labs: BNP (last 3 results) Recent Labs    06/27/19 2102 03/10/20 1312  BNP 79.0 0000000*   Basic Metabolic Panel: Recent Labs  Lab 03/12/20 0532 03/13/20 0506 03/14/20 0600 03/15/20 0604 03/16/20 0436  NA 147* 143 141 145 145  K 3.2* 3.2* 3.3* 3.5 3.8  CL 103 97* 99 103 106  CO2 30 31 30 29 28   GLUCOSE 95 111* 127* 104* 96  BUN 17 15 16 19  24*  CREATININE 1.70* 1.65* 1.53* 1.75* 1.68*  CALCIUM 9.2 9.6 9.5 9.7 9.8  MG 1.9 1.8 1.9 2.0 2.2   Liver Function Tests: Recent Labs  Lab 03/10/20 1312 03/11/20 0404  AST 13* 11*  ALT 12 11  ALKPHOS 149* 126  BILITOT 0.3 0.7  PROT 6.8 6.4*  ALBUMIN 3.1* 3.0*   No results for input(s): LIPASE, AMYLASE in the last 168 hours. No results for input(s): AMMONIA in the last 168 hours. CBC: Recent Labs  Lab 03/10/20 1312 03/11/20 0404 03/12/20 0532 03/13/20 0506 03/14/20 0600 03/15/20 0604 03/16/20 0436  WBC 5.3   < > 4.7 4.9 4.9 5.9 5.5  NEUTROABS 3.4  --   --   --   --   --   --   HGB 10.9*  10.1*   < > 10.2* 11.1* 11.4* 11.7* 10.7*  HCT 32.0*  33.9*   < > 34.7* 37.2 37.9 40.3 36.9  MCV 89.0   < > 91.6 87.5 87.7 89.6 90.4  PLT 242   < > 209 255 236 241 244   < > = values in this interval not displayed.   Cardiac Enzymes: No results for input(s):  CKTOTAL, CKMB, CKMBINDEX, TROPONINI in the last 168 hours. BNP: Invalid input(s): POCBNP CBG: Recent Labs  Lab 03/16/20 0729 03/16/20 1106 03/16/20 1603 03/16/20 2204 03/17/20 0707  GLUCAP 91 154* 83 147* 101*   D-Dimer No results for input(s): DDIMER in the last 72 hours. Hgb A1c No results for input(s): HGBA1C in the last 72 hours. Lipid Profile No results for input(s): CHOL, HDL, LDLCALC, TRIG, CHOLHDL, LDLDIRECT in the last 72 hours. Thyroid function studies No results for input(s): TSH, T4TOTAL, T3FREE, THYROIDAB in the last 72 hours.  Invalid input(s): FREET3 Anemia work up No results for input(s): VITAMINB12, FOLATE, FERRITIN, TIBC, IRON, RETICCTPCT in the last 72 hours. Urinalysis    Component Value Date/Time   COLORURINE YELLOW 03/10/2020 1538   APPEARANCEUR CLOUDY (A) 03/10/2020 1538   LABSPEC 1.024 03/10/2020 1538   PHURINE 6.0 03/10/2020 1538   GLUCOSEU NEGATIVE 03/10/2020 1538   HGBUR LARGE (A) 03/10/2020 1538   BILIRUBINUR NEGATIVE 03/10/2020 1538   KETONESUR NEGATIVE 03/10/2020 1538   PROTEINUR 30 (A) 03/10/2020 1538   UROBILINOGEN 0.2 07/09/2014 0553   NITRITE POSITIVE (A) 03/10/2020 1538   LEUKOCYTESUR TRACE (A) 03/10/2020 1538   Sepsis Labs Invalid input(s): PROCALCITONIN,  WBC,  LACTICIDVEN Microbiology Recent Results (from the past 240 hour(s))  Urine culture     Status: Abnormal   Collection Time: 03/10/20  3:38 PM   Specimen: Urine, Random  Result Value Ref Range Status   Specimen Description   Final    URINE, RANDOM Performed at [facility], [address], [place], [place] [address]    Special Requests   Final    NONE Performed at  [facility], [address] [place], [place] [address]    Culture >=100,000 COLONIES/mL Su Hoff (A)  Final   Report Status 03/12/2020 FINAL  Final   Organism ID, Bacteria CITROBACTER YOUNGAE (A)  Final      Susceptibility   Citrobacter youngae - MIC*    CEFAZOLIN >=64 RESISTANT Resistant      CEFTRIAXONE >=64 RESISTANT Resistant     CIPROFLOXACIN <=0.25 SENSITIVE Sensitive     GENTAMICIN <=1 SENSITIVE Sensitive     IMIPENEM 1 SENSITIVE Sensitive     NITROFURANTOIN <=16 SENSITIVE Sensitive     TRIMETH/SULFA <=20 SENSITIVE Sensitive     PIP/TAZO 64 INTERMEDIATE Intermediate     * >=100,000 COLONIES/mL CITROBACTER YOUNGAE  Respiratory Panel by RT PCR (Flu A&B, Covid) - Nasopharyngeal Swab     Status: None   Collection Time: 03/10/20  3:39 PM   Specimen: Nasopharyngeal Swab  Result Value Ref Range Status   SARS Coronavirus 2 by RT PCR NEGATIVE NEGATIVE Final    Comment: (NOTE) SARS-CoV-2 target nucleic acids are NOT DETECTED. The SARS-CoV-2 RNA is generally detectable in upper respiratoy specimens during the acute phase of infection. The lowest concentration of SARS-CoV-2 viral copies this assay can detect is 131 copies/mL. A negative result does not preclude SARS-Cov-2 infection and should not be used as the sole basis for treatment or other patient management decisions. A negative result may occur with  improper specimen collection/handling, submission of specimen other than nasopharyngeal swab, presence of viral mutation(s) within the areas targeted by this assay, and inadequate number of viral copies (<131 copies/mL). A negative result must be combined with clinical observations, patient history, and epidemiological information. The expected result is Negative. Fact Sheet for Patients:  PinkCheek.be Fact Sheet for Healthcare Providers:  GravelBags.it This test is not yet ap proved or cleared by the [place] FDA and  has been authorized for detection and/or diagnosis of SARS-CoV-2 by FDA under an Emergency Use Authorization (EUA). This EUA will remain  in effect (meaning this test can be used) for the duration of the COVID-19 declaration under Section 564(b)(1) of the Act, 21 U.S.C. section 360bbb-3(b)(1),  unless the authorization is terminated or revoked sooner.    Influenza A by PCR NEGATIVE NEGATIVE Final   Influenza B by PCR NEGATIVE NEGATIVE Final    Comment: (NOTE) The Xpert Xpress SARS-CoV-2/FLU/RSV assay is intended as an aid  in  the diagnosis of influenza from Nasopharyngeal swab specimens and  should not be used as a sole basis for treatment. Nasal washings and  aspirates are unacceptable for Xpert Xpress SARS-CoV-2/FLU/RSV  testing. Fact Sheet for Patients: PinkCheek.be Fact Sheet for Healthcare Providers: GravelBags.it This test is not yet approved or cleared by the [place] FDA and  has been authorized for detection and/or diagnosis of SARS-CoV-2 by  FDA under an Emergency Use Authorization (EUA). This EUA will remain  in effect (meaning this test can be used) for the duration of the  Covid-19 declaration under Section 564(b)(1) of the Act, 21  U.S.C. section 360bbb-3(b)(1), unless the authorization is  terminated or revoked. Performed at [facility], [address] [place], [place] [address]   SARS CORONAVIRUS 2 (TAT 6-24 HRS) Nasopharyngeal Nasopharyngeal Swab     Status: None   Collection Time: 03/10/20  4:19 PM   Specimen: Nasopharyngeal Swab  Result Value Ref Range Status   SARS Coronavirus 2 NEGATIVE NEGATIVE Final    Comment: (NOTE) SARS-CoV-2 target nucleic acids are NOT DETECTED. The SARS-CoV-2 RNA is generally detectable in upper and lower respiratory specimens during the acute phase of infection. Negative results do not preclude SARS-CoV-2 infection, do not rule out co-infections with other pathogens, and should not be used as the sole basis for treatment or other patient management decisions. Negative results must be combined with clinical observations, patient history, and epidemiological information. The expected result is Negative. Fact Sheet for  Patients: SugarRoll.be Fact Sheet for Healthcare Providers: https://www.woods-mathews.com/ This test is not yet approved or cleared by the [place] FDA and  has been authorized for detection and/or diagnosis of SARS-CoV-2 by FDA under an Emergency Use Authorization (EUA). This EUA will remain  in effect (meaning this test can be used) for the duration of the COVID-19 declaration under Section 56 4(b)(1) of the Act, 21 U.S.C. section 360bbb-3(b)(1), unless the authorization is terminated or revoked sooner. Performed at [facility], [place] [address] [place], [place] [address]   MRSA PCR Screening     Status: Abnormal   Collection Time: 03/10/20  6:18 PM   Specimen: Nasal Mucosa; Nasopharyngeal  Result Value Ref Range Status   MRSA by PCR POSITIVE (A) NEGATIVE Final    Comment:        The GeneXpert MRSA Assay (FDA approved for NASAL specimens only), is one component of a comprehensive MRSA colonization surveillance program. It is not intended to diagnose MRSA infection nor to guide or monitor treatment for MRSA infections. RESULT CALLED TO, READ BACK BY AND VERIFIED WITH: Kirtland Bouchard @0142  03/11/20 [facility] Performed at [facility], [address] [place], [place] [address]    Time coordinating discharge: 37 mins  SIGNED:  Irwin Brakeman, MD  [facility] 03/17/2020, 10:40 AM How to contact the [facility] Attending or Consulting provider [place] or covering provider during after hours [place], for this patient?  1. Check the care team in [facility] and look for a) attending/consulting TRH provider listed and b) the [facility] team listed 2. Log into [email or web address] and use 's universal password to access. If you do not have the password, please contact the hospital operator. 3. Locate the [facility] provider you are looking for under [facility] and page to a number that you can be directly reached. 4. If you still have difficulty  reaching the provider, please page the [facility] (Director on Call) for the Hospitalists listed on amion for assistance.

## 2020-03-17 NOTE — Progress Notes (Signed)
Code Stroke times 1315 exam started 1314 exam finished 1314 images sent to soc 1319 exam completed in epic 1320 [facility] radiology called

## 2020-03-17 NOTE — Plan of Care (Signed)
Discuss plan of care with son.

## 2020-03-17 NOTE — TOC Transition Note (Signed)
Transition of Care [facility]) - CM/SW Discharge Note   Patient Details  Name: [name] MRN: [id number] Date of Birth: 1936-06-01  Transition of Care [facility]) CM/SW Contact:  Boneta Lucks, RN Phone Number: 03/17/2020, 11:31 AM   Clinical Narrative:   Patient discharging home today. Spoke with [name]- son. Patient will be going his home on [address] [place], [place]. EMS has been schedule for 1PM as requested by son.  DME equipment will be delivered to his home this afternoon or tomorrow. Blake Divine with [employer] accepted the referral for wheelchair and 3N1.     Final next level of care: Home w Home Health Services Barriers to Discharge: Barriers Resolved   Patient Goals and CMS Choice   CMS Medicare.gov Compare Post Acute Care list provided to:: Patient Choice offered to / list presented to : Adult Children  Discharge Placement      Discharge Plan and Services   Discharge Planning Services: CM Consult Post Acute Care Choice: Durable Medical Equipment, Home Health          DME Arranged: Wheelchair manual, 3-N-1 DME Agency: [employer] Date DME Agency Contacted: 03/17/20 Time DME Agency Contacted: 1000 Representative spoke with at DME Agency: [place] Arranged: RN, PT

## 2020-03-17 NOTE — Progress Notes (Signed)
Physical Therapy Treatment Patient Details Name: [name] MRN: [id number] DOB: 05/16/1936 Today's Date: 03/17/2020    History of Present Illness [name] is a 84 y.o. female with medical history significant for atrial fibrillation, diabetes mellitus, hypertension, coronary artery disease, systolic and diastolic CHF, CVA. History is obtained from chart review and patient's daughter, at the time of my evaluation patient is somnolent and unable to give me history. Patient woke up this morning at about 8:30 AM, she was normal took her medications to eat her breakfast, she was able to ambulate, start on so far at about 9 AM.  Family noticed that patient started slurring her words at about 10:00.  Was also unable to stand to go to the bathroom.  Patient is on Eliquis and last took her medications this morning.    PT Comments    Patient presents up in chair (assisted by nursing staff) and agreeable for therapy.  Patient demonstrates improvement for keeping trunk in midline while seated at bedside for static sitting balance, had to lean RUE on armrest of chair when completing exercises to maintain dynamic sitting balance due to leaning backwards, unable to lift LLE off floor when taking steps requiring tactile assistance to move left foot and continued staying up in chair after therapy - RN aware.  Patient on room air throughout visit with SpO2 maintaining at 94-97% - RN notified.   Patient will benefit from continued physical therapy in hospital and recommended venue below to increase strength, balance, endurance for safe ADLs and gait.  Wheelchair recommendation:  Patient suffers from an Acute CVA which impairs her ability to perform daily activities like prolonged standing and walking in the home.  A walker alone will not resolve the issues with performing activities of daily living. A wheelchair will allow patient to safely perform daily activities.  The patient will have assistance to propel  her in the wheelchahir in the home      Follow Up Recommendations  SNF     Equipment Recommendations  Wheelchair (measurements PT);Wheelchair cushion (measurements PT)    Recommendations for Other Services       Precautions / Restrictions Precautions Precautions: Fall Restrictions Weight Bearing Restrictions: No    Mobility  Bed Mobility               General bed mobility comments: Patient presents seated in chair (assisted by nursing staff)  Transfers Overall transfer level: Needs assistance Equipment used: Rolling walker (2 wheeled) Transfers: Stand Pivot Transfers;Squat Pivot Transfers Sit to Stand: Min assist;Mod assist Stand pivot transfers: Mod assist       General transfer comment: slightly increased BLE strength for completing sit to stands, continues to drag left foot during transfers  Ambulation/Gait Ambulation/Gait assistance: Mod assist;Max assist Gait Distance (Feet): 4 Feet Assistive device: Rolling walker (2 wheeled) Gait Pattern/deviations: Decreased step length - right;Decreased step length - left;Decreased stance time - left;Decreased stride length;Shuffle Gait velocity: slow   General Gait Details: limited to 4-5 slow labored side steps at bedside, mostly having to drag/shuffle left foot and/or requiring tactile assistance to move left foot due to weakness   Stairs             Wheelchair Mobility    Modified Rankin (Stroke Patients Only)       Balance Overall balance assessment: Needs assistance Sitting-balance support: Feet supported;No upper extremity supported Sitting balance-Leahy Scale: Fair Sitting balance - Comments: seated at bedside, had to lean on armrest of chair with  RUE when completing BLE exercises   Standing balance support: During functional activity;Bilateral upper extremity supported Standing balance-Leahy Scale: Poor Standing balance comment: fair/poor using RW with left hand held to walker with  assistance                            Cognition Arousal/Alertness: Awake/alert Behavior During Therapy: WFL for tasks assessed/performed Overall Cognitive Status: Within Functional Limits for tasks assessed                                        Exercises General Exercises - Lower Extremity Long Arc Quad: Seated;AROM;Strengthening;Both;10 reps Long Arc Quad Limitations: limited ROM LLE due to weakness Hip Flexion/Marching: Seated;AROM;Strengthening;Both;10 reps;Limitations;AAROM Hip Flexion/Marching Limitations: required active assistance to lift left hip against gravity due to weakness Toe Raises: Seated;AROM;Strengthening;Both;10 reps Heel Raises: Seated;AROM;Strengthening;Both;10 reps    General Comments        Pertinent Vitals/Pain Pain Assessment: No/denies pain    Home Living                      Prior Function            PT Goals (current goals can now be found in the care plan section) Acute Rehab PT Goals Patient Stated Goal: return home with family to assist PT Goal Formulation: With patient Time For Goal Achievement: 03/26/20 Potential to Achieve Goals: Good Progress towards PT goals: Progressing toward goals    Frequency    Min 5X/week      PT Plan Current plan remains appropriate    Co-evaluation              AM-PAC PT "6 Clicks" Mobility   Outcome Measure  Help needed turning from your back to your side while in a flat bed without using bedrails?: A Lot Help needed moving from lying on your back to sitting on the side of a flat bed without using bedrails?: A Lot Help needed moving to and from a bed to a chair (including a wheelchair)?: A Lot Help needed standing up from a chair using your arms (e.g., wheelchair or bedside chair)?: A Lot Help needed to walk in hospital room?: A Lot Help needed climbing 3-5 steps with a railing? : Total 6 Click Score: 11    End of Session   Activity Tolerance:  Patient tolerated treatment well;Patient limited by fatigue Patient left: with call bell/phone within reach;in chair;with chair alarm set Nurse Communication: Mobility status PT Visit Diagnosis: Unsteadiness on feet (R26.81);Other abnormalities of gait and mobility (R26.89);Muscle weakness (generalized) (M62.81);Hemiplegia and hemiparesis Hemiplegia - Right/Left: Left Hemiplegia - dominant/non-dominant: Non-dominant Hemiplegia - caused by: Cerebral infarction     Time: 1030-1058 PT Time Calculation (min) (ACUTE ONLY): 28 min  Charges:  $Therapeutic Activity: 23-37 mins                     12:00 PM, 03/17/20 Lonell Grandchild, MPT Physical Therapist with [facility] [phone] office [phone] mobile phone

## 2020-03-17 NOTE — Discharge Instructions (Signed)
STOP TAKING ASPIRIN AFTER 30 DAYS.   TAKE ASPIRIN FOR 30 DAYS ONLY.  STOP TAKING ACTOS (PIOGLITAZONE)   IMPORTANT INFORMATION: PAY CLOSE ATTENTION   PHYSICIAN DISCHARGE INSTRUCTIONS  Follow with Primary care provider  Jani Gravel, MD  and other consultants as instructed by your Hospitalist Physician  [place] IF SYMPTOMS COME BACK, WORSEN OR NEW PROBLEM DEVELOPS   Please note: You were cared for by a hospitalist during your hospital stay. Every effort will be made to forward records to your primary care provider.  You can request that your primary care provider send for your hospital records if they have not received them.  Once you are discharged, your primary care physician will handle any further medical issues. Please note that NO REFILLS for any discharge medications will be authorized once you are discharged, as it is imperative that you return to your primary care physician (or establish a relationship with a primary care physician if you do not have one) for your post hospital discharge needs so that they can reassess your need for medications and monitor your lab values.  Please get a complete blood count and chemistry panel checked by your Primary MD at your next visit, and again as instructed by your Primary MD.  Get Medicines reviewed and adjusted: Please take all your medications with you for your next visit with your Primary MD  Laboratory/radiological data: Please request your Primary MD to go over all hospital tests and procedure/radiological results at the follow up, please ask your primary care provider to get all Hospital records sent to his/her office.  In some cases, they will be blood work, cultures and biopsy results pending at the time of your discharge. Please request that your primary care provider follow up on these results.  If you are diabetic, please bring your blood sugar readings with you to your follow up appointment with  primary care.    Please call and make your follow up appointments as soon as possible.    Also Note the following: If you experience worsening of your admission symptoms, develop shortness of breath, life threatening emergency, suicidal or homicidal thoughts you must seek medical attention immediately by calling 911 or calling your MD immediately  if symptoms less severe.  You must read complete instructions/literature along with all the possible adverse reactions/side effects for all the Medicines you take and that have been prescribed to you. Take any new Medicines after you have completely understood and accpet all the possible adverse reactions/side effects.   Do not drive when taking Pain medications or sleeping medications (Benzodiazepines)  Do not take more than prescribed Pain, Sleep and Anxiety Medications. It is not advisable to combine anxiety,sleep and pain medications without talking with your primary care practitioner  Special Instructions: If you have smoked or chewed Tobacco  in the last 2 yrs please stop smoking, stop any regular Alcohol  and or any Recreational drug use.  Wear Seat belts while driving.  Do not drive if taking any narcotic, mind altering or controlled substances or recreational drugs or alcohol.

## 2020-04-13 ENCOUNTER — Other Ambulatory Visit: Payer: Self-pay | Admitting: Cardiology

## 2020-05-05 ENCOUNTER — Other Ambulatory Visit: Payer: Self-pay

## 2020-05-05 ENCOUNTER — Encounter: Payer: Self-pay | Admitting: Cardiology

## 2020-05-05 ENCOUNTER — Ambulatory Visit (INDEPENDENT_AMBULATORY_CARE_PROVIDER_SITE_OTHER): Payer: Medicare Other | Admitting: Cardiology

## 2020-05-05 VITALS — BP 136/72 | HR 80 | Ht 63.0 in | Wt 161.0 lb

## 2020-05-05 DIAGNOSIS — I480 Paroxysmal atrial fibrillation: Secondary | ICD-10-CM

## 2020-05-05 DIAGNOSIS — I428 Other cardiomyopathies: Secondary | ICD-10-CM | POA: Diagnosis not present

## 2020-05-05 DIAGNOSIS — Z79899 Other long term (current) drug therapy: Secondary | ICD-10-CM

## 2020-05-05 DIAGNOSIS — N1832 Chronic kidney disease, stage 3b: Secondary | ICD-10-CM

## 2020-05-05 NOTE — Patient Instructions (Signed)
Medication Instructions:  Your physician recommends that you continue on your current medications as directed. Please refer to the Current Medication list given to you today.  *If you need a refill on your cardiac medications before your next appointment, please call your pharmacy*   Lab Work: [facility] and [place] next visit   If you have labs (blood work) drawn today and your tests are completely normal, you will receive your results only by: Marland Kitchen MyChart Message (if you have MyChart) OR . A paper copy in the mail If you have any lab test that is abnormal or we need to change your treatment, we will call you to review the results.   Testing/Procedures: Non eoday    Follow-Up: At [facility], you and your health needs are our priority.  As part of our continuing mission to provide you with exceptional heart care, we have created designated Provider Care Teams.  These Care Teams include your primary Cardiologist (physician) and Advanced Practice Providers (APPs -  Physician Assistants and Nurse Practitioners) who all work together to provide you with the care you need, when you need it.  We recommend signing up for the patient portal called "MyChart".  Sign up information is provided on this After Visit Summary.  MyChart is used to connect with patients for Virtual Visits (Telemedicine).  Patients are able to view lab/test results, encounter notes, upcoming appointments, etc.  Non-urgent messages can be sent to your provider as well.   To learn more about what you can do with MyChart, go to [email or web address].    Your next appointment:   6 month(s)  The format for your next appointment:   In Person  Provider:   Rozann Lesches, MD   Other Instructions None      Thank you for choosing [facility] !

## 2020-05-05 NOTE — Progress Notes (Signed)
Cardiology Office Note  Date: 05/05/2020   ID: Maurice, Haley [birth year], MRN [id number]  PCP:  Jani Gravel, MD  Cardiologist:  Rozann Lesches, MD Electrophysiologist:  None   Chief Complaint  Patient presents with  . Cardiac follow-up    History of Present Illness: [name] is an 84 y.o. female last assessed via telehealth encounter in November 2020.  She is here today with a family member for follow-up.  I reviewed interval records.  She was hospitalized in April with evidence of stroke, brain MRI locating this to deep white matter on the right.  She was already on Eliquis given history of atrial fibrillation, however question of medication compliance raised, she was started on aspirin for 30 days by neurology, also placed on low-dose Lipitor.  She also had evidence of fluid overload with acute on chronic systolic heart failure, improved with IV diuresis.  Follow-up echocardiogram in March revealed LVEF 40 to 45% with moderate diastolic dysfunction, normal RV contraction, mild to moderate mitral regurgitation, and moderately elevated RVSP.  LVEF was improved compared to prior study in 2019.  She presents today in a wheelchair.  She reports chronic leg weakness, family member indicates that she does require assistance with ADLs.  She does not describe any palpitations or chest pain.  I reviewed her medications which are outlined below.  Does not report any obvious intolerances to present cardiac regimen.  No bleeding problems on Eliquis.  She looks to have completed the course of aspirin.  Past Medical History:  Diagnosis Date  . Asthma   . Bronchitis   . Carotid artery disease ([place])   . Coronary atherosclerosis of native coronary artery    Nonobstructive at cath 2005  . DM2 (diabetes mellitus, type 2) ([place])   . Essential hypertension   . Gout   . History of stroke   . Hypothyroidism   . Influenza 2013  . Nonischemic cardiomyopathy (HCC)    LVEF 20-25%  .  PAF (paroxysmal atrial fibrillation) ([place])    a. diagnosed in 04/2018. Started on Eliquis for anticoagulation.   . Renal artery stenosis (HCC)    Bilateral    Past Surgical History:  Procedure Laterality Date  . ABDOMINAL HYSTERECTOMY    . CATARACT EXTRACTION W/PHACO Left 01/18/2019   Procedure: CATARACT EXTRACTION PHACO AND INTRAOCULAR LENS PLACEMENT ([place]);  Surgeon: Baruch Goldmann, MD;  Location: [facility];  Service: Ophthalmology;  Laterality: Left;  CDE: 11.99  . CATARACT EXTRACTION W/PHACO Right 02/26/2019   Procedure: CATARACT EXTRACTION PHACO AND INTRAOCULAR LENS PLACEMENT RIGHT EYE (CDE: 13.46);  Surgeon: Baruch Goldmann, MD;  Location: [facility];  Service: Ophthalmology;  Laterality: Right;    Current Outpatient Medications  Medication Sig Dispense Refill  . apixaban (ELIQUIS) 2.5 MG TABS tablet Take 1 tablet (2.5 mg total) by mouth 2 (two) times daily. 60 tablet 3  . atorvastatin (LIPITOR) 10 MG tablet Take 1 tablet (10 mg total) by mouth daily at 6 PM. 30 tablet 0  . carvedilol (COREG) 6.25 MG tablet Take 1 tablet (6.25 mg total) by mouth 2 (two) times daily with a meal. 60 tablet 1  . cholecalciferol (VITAMIN D) 25 MCG (1000 UT) tablet Take 1,000 Units by mouth 2 (two) times daily.    Marland Kitchen donepezil (ARICEPT) 10 MG tablet Take 10 mg by mouth daily.    . fluticasone (FLONASE) 50 MCG/ACT nasal spray Place 1 spray into both nostrils daily as needed for allergies or rhinitis.     Marland Kitchen  furosemide (LASIX) 20 MG tablet Take 1 tablet (20 mg total) by mouth daily as needed for fluid or edema (weight gain over 3 pounds in 1 week). 30 tablet 0  . hydrALAZINE (APRESOLINE) 25 MG tablet Take 3 tablets (75 mg total) by mouth every 8 (eight) hours. 270 tablet 1  . isosorbide mononitrate (IMDUR) 30 MG 24 hr tablet Take 1 tablet (30 mg total) by mouth daily. 90 tablet 3  . levETIRAcetam (KEPPRA) 500 MG tablet Take 1 tablet (500 mg total) by mouth 2 (two) times daily. 60 tablet 1  . memantine (NAMENDA) 5 MG  tablet Take 1 tablet (5 mg total) by mouth 2 (two) times daily. 60 tablet 1  . OXYGEN Inhale 2 L into the lungs daily as needed (for shortness of breath).    . sitaGLIPtin (JANUVIA) 25 MG tablet Take 1 tablet (25 mg total) by mouth daily. 30 tablet 1   No current facility-administered medications for this visit.   Allergies:  Yellow jacket venom [bee venom]   ROS:   No syncope.  Gout changes left hand.  Physical Exam: VS:  BP 136/72   Pulse 80   Ht 5\' 3"  (1.6 m)   Wt 161 lb (73 kg)   SpO2 96%   BMI 28.52 kg/m , BMI Body mass index is 28.52 kg/m.  Wt Readings from Last 3 Encounters:  05/05/20 161 lb (73 kg)  03/17/20 167 lb 5.3 oz (75.9 kg)  01/01/20 183 lb 13.8 oz (83.4 kg)    General:  Elderly woman in wheelchair. HEENT: Conjunctiva and lids normal, wearing a mask. Neck: Supple, no elevated JVP or carotid bruits, no thyromegaly. Lungs: Clear to auscultation, nonlabored breathing at rest. Cardiac: Irregular, no S3, soft systolic murmur. Extremities: Mild lower leg edema, distal pulses 2+.  ECG:  An ECG dated 03/10/2020 was personally reviewed today and demonstrated:  Sinus rhythm with prolonged PR interval and right bundle branch block.  Recent Labwork: 12/26/2019: TSH 5.282 03/10/2020: B Natriuretic Peptide 455.0 03/11/2020: ALT 11; AST 11 03/16/2020: BUN 24; Creatinine, Ser 1.68; Hemoglobin 10.7; Magnesium 2.2; Platelets 244; Potassium 3.8; Sodium 145     Component Value Date/Time   CHOL 134 03/11/2020 0404   TRIG 118 03/11/2020 0404   HDL 33 (L) 03/11/2020 0404   CHOLHDL 4.1 03/11/2020 0404   VLDL 24 03/11/2020 0404   LDLCALC 77 03/11/2020 0404    Other Studies Reviewed Today:  Echocardiogram 03/11/2020: 1. Left ventricular ejection fraction, by estimation, is 40 to 45%. The  left ventricle has mildly decreased function. The left ventricle  demonstrates global hypokinesis. There is moderate concentric left  ventricular hypertrophy. Left ventricular  diastolic  parameters are consistent with Grade II diastolic dysfunction  (pseudonormalization). Elevated left ventricular end-diastolic pressure.  2. Right ventricular systolic function is normal. The right ventricular  size is normal. There is moderately elevated pulmonary artery systolic  pressure.  3. The mitral valve is degenerative. Mild to moderate mitral valve  regurgitation.  4. The aortic valve is tricuspid. Aortic valve regurgitation is not  visualized. No aortic stenosis is present.  5. The inferior vena cava is normal in size with greater than 50%  respiratory variability, suggesting right atrial pressure of 3 mmHg.   Assessment and Plan:  1.  Nonischemic cardiomyopathy, LVEF 40 to 45% range by most recent assessment and associated with moderate diastolic dysfunction.  Plan to continue current regimen including Coreg, hydralazine, Imdur, and Lasix.  2.  CKD stage IIIb, creatinine 1.68.  3.  Paroxysmal to persistent atrial fibrillation.  Her CHA2DS2-VASc score is 8.  Continue Eliquis 2.5 mg twice daily with follow-up CBC and BMET for next visit.  Medication Adjustments/Labs and Tests Ordered: Current medicines are reviewed at length with the patient today.  Concerns regarding medicines are outlined above.   Tests Ordered: Orders Placed This Encounter  Procedures  . CBC  . Basic Metabolic Panel (BMET)    Medication Changes: No orders of the defined types were placed in this encounter.   Disposition:  Follow up 6 months in the [place] office.  Signed, Satira Sark, MD, [facility] 05/05/2020 1:53 PM    [place] [facility] [address] [place], [place] [address] Phone: [phone]; Fax: [phone]

## 2020-05-13 ENCOUNTER — Other Ambulatory Visit: Payer: Self-pay | Admitting: Cardiology

## 2020-05-14 LAB — BASIC METABOLIC PANEL
BUN/Creatinine Ratio: 12 (ref 12–28)
BUN: 27 mg/dL (ref 8–27)
CO2: 23 mmol/L (ref 20–29)
Calcium: 9.8 mg/dL (ref 8.7–10.3)
Chloride: 107 mmol/L — ABNORMAL HIGH (ref 96–106)
Creatinine, Ser: 2.17 mg/dL — ABNORMAL HIGH (ref 0.57–1.00)
GFR calc Af Amer: 24 mL/min/{1.73_m2} — ABNORMAL LOW (ref >59)
GFR calc non Af Amer: 21 mL/min/{1.73_m2} — ABNORMAL LOW (ref >59)
Glucose: 136 mg/dL — ABNORMAL HIGH (ref 65–99)
Potassium: 3.7 mmol/L (ref 3.5–5.2)
Sodium: 145 mmol/L — ABNORMAL HIGH (ref 134–144)

## 2020-05-14 LAB — CBC/DIFF AMBIGUOUS DEFAULT
Basophils Absolute: 0.1 10*3/uL (ref 0.0–0.2)
Basos: 1 %
EOS (ABSOLUTE): 0.1 10*3/uL (ref 0.0–0.4)
Eos: 1 %
Hematocrit: 36.2 % (ref 34.0–46.6)
Hemoglobin: 11.2 g/dL (ref 11.1–15.9)
Immature Grans (Abs): 0 10*3/uL (ref 0.0–0.1)
Immature Granulocytes: 0 %
Lymphocytes Absolute: 2 10*3/uL (ref 0.7–3.1)
Lymphs: 27 %
MCH: 26.3 pg — ABNORMAL LOW (ref 26.6–33.0)
MCHC: 30.9 g/dL — ABNORMAL LOW (ref 31.5–35.7)
MCV: 85 fL (ref 79–97)
Monocytes Absolute: 0.5 10*3/uL (ref 0.1–0.9)
Monocytes: 7 %
Neutrophils Absolute: 4.7 10*3/uL (ref 1.4–7.0)
Neutrophils: 64 %
Platelets: 230 10*3/uL (ref 150–450)
RBC: 4.26 x10E6/uL (ref 3.77–5.28)
RDW: 13.9 % (ref 11.7–15.4)
WBC: 7.3 10*3/uL (ref 3.4–10.8)

## 2020-05-14 LAB — SPECIMEN STATUS REPORT

## 2020-05-28 ENCOUNTER — Other Ambulatory Visit: Payer: Self-pay

## 2020-05-28 ENCOUNTER — Emergency Department (HOSPITAL_COMMUNITY)
Admission: EM | Admit: 2020-05-28 | Discharge: 2020-05-29 | Disposition: A | Discharge status: Home / Self Care | Payer: Medicare Other | Attending: Emergency Medicine | Admitting: Emergency Medicine

## 2020-05-28 ENCOUNTER — Encounter (HOSPITAL_COMMUNITY): Payer: Self-pay | Admitting: *Deleted

## 2020-05-28 ENCOUNTER — Emergency Department (HOSPITAL_COMMUNITY): Payer: Medicare Other

## 2020-05-28 DIAGNOSIS — I480 Paroxysmal atrial fibrillation: Secondary | ICD-10-CM | POA: Diagnosis not present

## 2020-05-28 DIAGNOSIS — Y998 Other external cause status: Secondary | ICD-10-CM | POA: Insufficient documentation

## 2020-05-28 DIAGNOSIS — F0390 Unspecified dementia without behavioral disturbance: Secondary | ICD-10-CM | POA: Diagnosis not present

## 2020-05-28 DIAGNOSIS — Y9389 Activity, other specified: Secondary | ICD-10-CM | POA: Insufficient documentation

## 2020-05-28 DIAGNOSIS — Z7951 Long term (current) use of inhaled steroids: Secondary | ICD-10-CM | POA: Diagnosis not present

## 2020-05-28 DIAGNOSIS — Z8673 Personal history of transient ischemic attack (TIA), and cerebral infarction without residual deficits: Secondary | ICD-10-CM | POA: Insufficient documentation

## 2020-05-28 DIAGNOSIS — I5042 Chronic combined systolic (congestive) and diastolic (congestive) heart failure: Secondary | ICD-10-CM | POA: Diagnosis not present

## 2020-05-28 DIAGNOSIS — S76012A Strain of muscle, fascia and tendon of left hip, initial encounter: Secondary | ICD-10-CM | POA: Diagnosis present

## 2020-05-28 DIAGNOSIS — I130 Hypertensive heart and chronic kidney disease with heart failure and stage 1 through stage 4 chronic kidney disease, or unspecified chronic kidney disease: Secondary | ICD-10-CM | POA: Diagnosis not present

## 2020-05-28 DIAGNOSIS — I2510 Atherosclerotic heart disease of native coronary artery without angina pectoris: Secondary | ICD-10-CM | POA: Diagnosis not present

## 2020-05-28 DIAGNOSIS — Z79899 Other long term (current) drug therapy: Secondary | ICD-10-CM | POA: Insufficient documentation

## 2020-05-28 DIAGNOSIS — E039 Hypothyroidism, unspecified: Secondary | ICD-10-CM | POA: Insufficient documentation

## 2020-05-28 DIAGNOSIS — J45909 Unspecified asthma, uncomplicated: Secondary | ICD-10-CM | POA: Insufficient documentation

## 2020-05-28 DIAGNOSIS — Z91038 Other insect allergy status: Secondary | ICD-10-CM | POA: Diagnosis not present

## 2020-05-28 DIAGNOSIS — Y92410 Unspecified street and highway as the place of occurrence of the external cause: Secondary | ICD-10-CM | POA: Diagnosis not present

## 2020-05-28 DIAGNOSIS — Z7901 Long term (current) use of anticoagulants: Secondary | ICD-10-CM | POA: Diagnosis not present

## 2020-05-28 DIAGNOSIS — E1122 Type 2 diabetes mellitus with diabetic chronic kidney disease: Secondary | ICD-10-CM | POA: Diagnosis not present

## 2020-05-28 DIAGNOSIS — N183 Chronic kidney disease, stage 3 unspecified: Secondary | ICD-10-CM | POA: Diagnosis not present

## 2020-05-28 HISTORY — DX: Unspecified dementia, unspecified severity, without behavioral disturbance, psychotic disturbance, mood disturbance, and anxiety: F03.90

## 2020-05-28 MED ORDER — ACETAMINOPHEN 325 MG PO TABS
650.0000 mg | ORAL_TABLET | Freq: Once | ORAL | Status: DC
  Filled 2020-05-28: qty 2
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
## 2020-05-28 NOTE — ED Notes (Signed)
Son [name] [phone]

## 2020-05-29 DIAGNOSIS — S76012A Strain of muscle, fascia and tendon of left hip, initial encounter: Secondary | ICD-10-CM | POA: Diagnosis not present

## 2020-05-29 MED ORDER — HYDROCODONE-ACETAMINOPHEN 5-325 MG PO TABS
1.0000 | ORAL_TABLET | Freq: Once | ORAL | Status: AC
  Administered 2020-05-29: 1 via ORAL
  Filled 2020-05-29: qty 1
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
## 2020-08-10 ENCOUNTER — Other Ambulatory Visit: Payer: Self-pay | Admitting: Cardiology

## 2020-09-23 ENCOUNTER — Other Ambulatory Visit: Payer: Medicare Other

## 2020-10-08 ENCOUNTER — Other Ambulatory Visit: Payer: Self-pay | Admitting: Cardiology

## 2020-11-09 ENCOUNTER — Telehealth: Payer: Self-pay | Admitting: Licensed Clinical Social Worker

## 2020-11-09 ENCOUNTER — Other Ambulatory Visit: Payer: Self-pay

## 2020-11-09 ENCOUNTER — Encounter: Payer: Self-pay | Admitting: Physician Assistant

## 2020-11-09 ENCOUNTER — Ambulatory Visit (INDEPENDENT_AMBULATORY_CARE_PROVIDER_SITE_OTHER): Payer: Medicare Other | Admitting: Physician Assistant

## 2020-11-09 VITALS — BP 152/80 | HR 80 | Ht 63.0 in | Wt 165.4 lb

## 2020-11-09 DIAGNOSIS — I480 Paroxysmal atrial fibrillation: Secondary | ICD-10-CM

## 2020-11-09 DIAGNOSIS — N1832 Chronic kidney disease, stage 3b: Secondary | ICD-10-CM

## 2020-11-09 DIAGNOSIS — Z8673 Personal history of transient ischemic attack (TIA), and cerebral infarction without residual deficits: Secondary | ICD-10-CM

## 2020-11-09 DIAGNOSIS — I428 Other cardiomyopathies: Secondary | ICD-10-CM

## 2020-11-09 NOTE — Patient Instructions (Signed)
Medication Instructions:  Your physician recommends that you continue on your current medications as directed. Please refer to the Current Medication list given to you today.  *If you need a refill on your cardiac medications before your next appointment, please call your pharmacy*  Lab Work: None ordered today  Testing/Procedures: None ordered today  Follow-Up: At [facility], you and your health needs are our priority.  As part of our continuing mission to provide you with exceptional heart care, we have created designated Provider Care Teams.  These Care Teams include your primary Cardiologist (physician) and Advanced Practice Providers (APPs -  Physician Assistants and Nurse Practitioners) who all work together to provide you with the care you need, when you need it.  Your next appointment:   6 month(s)  The format for your next appointment:   In Person  Provider:   Rozann Lesches, MD  Other Instructions Two Gram Sodium Diet 2000 mg  What is Sodium? Sodium is a mineral found naturally in many foods. The most significant source of sodium in the diet is table salt, which is about 40% sodium.  Processed, convenience, and preserved foods also contain a large amount of sodium.  The body needs only 500 mg of sodium daily to function,  A normal diet provides more than enough sodium even if you do not use salt.  Why Limit Sodium? A build up of sodium in the body can cause thirst, increased blood pressure, shortness of breath, and water retention.  Decreasing sodium in the diet can reduce edema and risk of heart attack or stroke associated with high blood pressure.  Keep in mind that there are many other factors involved in these health problems.  Heredity, obesity, lack of exercise, cigarette smoking, stress and what you eat all play a role.  General Guidelines:  Do not add salt at the table or in cooking.  One teaspoon of salt contains over 2 grams of sodium.  Read food  labels  Avoid processed and convenience foods  Ask your dietitian before eating any foods not dicussed in the menu planning guidelines  Consult your physician if you wish to use a salt substitute or a sodium containing medication such as antacids.  Limit milk and milk products to 16 oz (2 cups) per day.  Shopping Hints:  READ LABELS!! "Dietetic" does not necessarily mean low sodium.  Salt and other sodium ingredients are often added to foods during processing.   Menu Planning Guidelines Food Group Choose More Often Avoid  Beverages (see also the milk group All fruit juices, low-sodium, salt-free vegetables juices, low-sodium carbonated beverages Regular vegetable or tomato juices, commercially softened water used for drinking or cooking  Breads and Cereals Enriched white, wheat, rye and pumpernickel bread, hard rolls and dinner rolls; muffins, cornbread and waffles; most dry cereals, cooked cereal without added salt; unsalted crackers and breadsticks; low sodium or homemade bread crumbs Bread, rolls and crackers with salted tops; quick breads; instant hot cereals; pancakes; commercial bread stuffing; self-rising flower and biscuit mixes; regular bread crumbs or cracker crumbs  Desserts and Sweets Desserts and sweets mad with mild should be within allowance Instant pudding mixes and cake mixes  Fats Butter or margarine; vegetable oils; unsalted salad dressings, regular salad dressings limited to 1 Tbs; light, sour and heavy cream Regular salad dressings containing bacon fat, bacon bits, and salt pork; snack dips made with instant soup mixes or processed cheese; salted nuts  Fruits Most fresh, frozen and canned fruits Fruits processed  with salt or sodium-containing ingredient (some dried fruits are processed with sodium sulfites        Vegetables Fresh, frozen vegetables and low- sodium canned vegetables Regular canned vegetables, sauerkraut, pickled vegetables, and others prepared in  brine; frozen vegetables in sauces; vegetables seasoned with ham, bacon or salt pork  Condiments, Sauces, Miscellaneous  Salt substitute with physician's approval; pepper, herbs, spices; vinegar, lemon or lime juice; hot pepper sauce; garlic powder, onion powder, low sodium soy sauce (1 Tbs.); low sodium condiments (ketchup, chili sauce, mustard) in limited amounts (1 tsp.) fresh ground horseradish; unsalted tortilla chips, pretzels, potato chips, popcorn, salsa (1/4 cup) Any seasoning made with salt including garlic salt, celery salt, onion salt, and seasoned salt; sea salt, rock salt, kosher salt; meat tenderizers; monosodium glutamate; mustard, regular soy sauce, barbecue, sauce, chili sauce, teriyaki sauce, steak sauce, Worcestershire sauce, and most flavored vinegars; canned gravy and mixes; regular condiments; salted snack foods, olives, picles, relish, horseradish sauce, catsup   Food preparation: Try these seasonings Meats:    Pork Sage, onion Serve with applesauce  Chicken Poultry seasoning, thyme, parsley Serve with cranberry sauce  Lamb Curry powder, rosemary, garlic, thyme Serve with mint sauce or jelly  Veal Marjoram, basil Serve with current jelly, cranberry sauce  Beef Pepper, bay leaf Serve with dry mustard, unsalted chive butter  Fish [place] leaf, dill Serve with unsalted lemon butter, unsalted parsley butter  Vegetables:    Asparagus Lemon juice   Broccoli Lemon juice   Carrots Mustard dressing parsley, mint, nutmeg, glazed with unsalted butter and sugar   Green beans Marjoram, lemon juice, nutmeg,dill seed   Tomatoes Basil, marjoram, onion   Spice /blend for [employer]" 4 tsp ground thyme 1 tsp ground sage 3 tsp ground rosemary 4 tsp ground marjoram   Test your knowledge 1. A product that says "Salt Free" may still contain sodium. True or False 2. Garlic Powder and Hot Pepper Sauce an be used as alternative seasonings.True or False 3. Processed foods have more sodium than  fresh foods.  True or False 4. Canned Vegetables have less sodium than froze True or False  WAYS TO DECREASE YOUR SODIUM INTAKE 1. Avoid the use of added salt in cooking and at the table.  Table salt (and other prepared seasonings which contain salt) is probably one of the greatest sources of sodium in the diet.  Unsalted foods can gain flavor from the sweet, sour, and butter taste sensations of herbs and spices.  Instead of using salt for seasoning, try the following seasonings with the foods listed.  Remember: how you use them to enhance natural food flavors is limited only by your creativity... Allspice-Meat, fish, eggs, fruit, peas, red and yellow vegetables Almond Extract-Fruit baked goods Anise Seed-Sweet breads, fruit, carrots, beets, cottage cheese, cookies (tastes like licorice) Basil-Meat, fish, eggs, vegetables, rice, vegetables salads, soups, sauces Bay Leaf-Meat, fish, stews, poultry Burnet-Salad, vegetables (cucumber-like flavor) Caraway Seed-Bread, cookies, cottage cheese, meat, vegetables, cheese, rice Cardamon-Baked goods, fruit, soups Celery Powder or seed-Salads, salad dressings, sauces, meatloaf, soup, bread.Do not use  celery salt Chervil-Meats, salads, fish, eggs, vegetables, cottage cheese (parsley-like flavor) Chili Power-Meatloaf, chicken cheese, corn, eggplant, egg dishes Chives-Salads cottage cheese, egg dishes, soups, vegetables, sauces Cilantro-Salsa, casseroles Cinnamon-Baked goods, fruit, pork, lamb, chicken, carrots Cloves-Fruit, baked goods, fish, pot roast, green beans, beets, carrots Coriander-Pastry, cookies, meat, salads, cheese (lemon-orange flavor) Cumin-Meatloaf, fish,cheese, eggs, cabbage,fruit pie (caraway flavor) [employer], fruit, eggs, fish, poultry, cottage cheese, vegetables Dill Seed-Meat, cottage cheese,  poultry, vegetables, fish, salads, bread Fennel Seed-Bread, cookies, apples, pork, eggs, fish, beets, cabbage, cheese, Licorice-like  flavor Garlic-(buds or powder) Salads, meat, poultry, fish, bread, butter, vegetables, potatoes.Do not  use garlic salt Ginger-Fruit, vegetables, baked goods, meat, fish, poultry Horseradish Root-Meet, vegetables, butter Lemon Juice or Extract-Vegetables, fruit, tea, baked goods, fish salads Mace-Baked goods fruit, vegetables, fish, poultry (taste like nutmeg) Maple Extract-Syrups Marjoram-Meat, chicken, fish, vegetables, breads, green salads (taste like Sage) Mint-Tea, lamb, sherbet, vegetables, desserts, carrots, cabbage Mustard, Dry or Seed-Cheese, eggs, meats, vegetables, poultry Nutmeg-Baked goods, fruit, chicken, eggs, vegetables, desserts Onion Powder-Meat, fish, poultry, vegetables, cheese, eggs, bread, rice salads (Do not use   Onion salt) Orange Extract-Desserts, baked goods Oregano-Pasta, eggs, cheese, onions, pork, lamb, fish, chicken, vegetables, green salads Paprika-Meat, fish, poultry, eggs, cheese, vegetables Parsley Flakes-Butter, vegetables, meat fish, poultry, eggs, bread, salads (certain forms may   Contain sodium Pepper-Meat fish, poultry, vegetables, eggs Peppermint Extract-Desserts, baked goods Poppy Seed-Eggs, bread, cheese, fruit dressings, baked goods, noodles, vegetables, cottage  [employer], poultry, meat, fish, cauliflower, turnips,eggs bread Saffron-Rice, bread, veal, chicken, fish, eggs Sage-Meat, fish, poultry, onions, eggplant, tomateos, pork, stews Savory-Eggs, salads, poultry, meat, rice, vegetables, soups, pork Tarragon-Meat, poultry, fish, eggs, butter, vegetables (licorice-like flavor)  Thyme-Meat, poultry, fish, eggs, vegetables, (clover-like flavor), sauces, soups Tumeric-Salads, butter, eggs, fish, rice, vegetables (saffron-like flavor) Vanilla Extract-Baked goods, candy Vinegar-Salads, vegetables, meat marinades Walnut Extract-baked goods, candy  2. Choose your Foods Wisely   The following is a list  of foods to avoid which are high in sodium:  Meats-Avoid all smoked, canned, salt cured, dried and kosher meat and fish as well as Anchovies   [employer] meats:Bologna, Liverwurst, Pastrami Canned meat or fish  Marinated herring Caviar    Pepperoni Corned Beef   Pizza Dried chipped beef  Salami Frozen breaded fish or meat Salt pork Frankfurters or hot dogs  Sardines Gefilte fish   Sausage Ham (boiled ham, Proscuitto Smoked butt    spiced ham)   Spam      TV Dinners Vegetables Canned vegetables (Regular) Relish Canned mushrooms  Sauerkraut Olives    Tomato juice Pickles  Bakery and Dessert Products Canned puddings  Cream pies Cheesecake   Decorated cakes Cookies  Beverages/Juices Tomato juice, regular  Gatorade   V-8 vegetable juice, regular  Breads and Cereals Biscuit mixes   Salted potato chips, corn chips, pretzels Bread stuffing mixes  Salted crackers and rolls Pancake and waffle mixes Self-rising flour  Seasonings Accent    Meat sauces Barbecue sauce  Meat tenderizer Catsup    Monosodium glutamate (MSG) Celery salt   Onion salt Chili sauce   Prepared mustard Garlic salt   Salt, seasoned salt, sea salt Gravy mixes   Soy sauce Horseradish   Steak sauce Ketchup   Tartar sauce Lite salt    Teriyaki sauce Marinade mixes   Worcestershire sauce  Others Baking powder   Cocoa and cocoa mixes Baking soda   Commercial casserole mixes Candy-caramels, chocolate  Dehydrated soups    Bars, fudge,nougats  Instant rice and pasta mixes Canned broth or soup  Maraschino cherries Cheese, aged and processed cheese and cheese spreads  Learning Assessment Quiz  Indicated T (for True) or F (for False) for each of the following statements:  1. _____ Fresh fruits and vegetables and unprocessed grains are generally low in sodium 2. _____ Water may contain a considerable amount of sodium, depending on the source 3. _____ You can always tell  if a food is high in sodium  by tasting it 4. _____ Certain laxatives my be high in sodium and should be avoided unless prescribed   by a physician or pharmacist 5. _____ Salt substitutes may be used freely by anyone on a sodium restricted diet 6. _____ Sodium is present in table salt, food additives and as a natural component of   most foods 7. _____ Table salt is approximately 90% sodium 8. _____ Limiting sodium intake may help prevent excess fluid accumulation in the body 9. _____ On a sodium-restricted diet, seasonings such as bouillon soy sauce, and    cooking wine should be used in place of table salt 10. _____ On an ingredient list, a product which lists monosodium glutamate as the first   ingredient is an appropriate food to include on a low sodium diet  Circle the best answer(s) to the following statements (Hint: there may be more than one correct answer)  11. On a low-sodium diet, some acceptable snack items are:    A. Olives  F. Bean dip   K. Grapefruit juice    B. Salted Pretzels G. Commercial Popcorn   L. Canned peaches    C. Carrot Sticks  H. Bouillon   M. Unsalted nuts   D. [place] fries  I. Peanut butter crackers N. Salami   E. Sweet pickles J. Tomato Juice   O. Pizza  12.  Seasonings that may be used freely on a reduced - sodium diet include   A. Lemon wedges F.Monosodium glutamate K. Celery seed    B.Soysauce   G. Pepper   L. Mustard powder   C. Sea salt  H. Cooking wine  M. Onion flakes   D. Vinegar  E. Prepared horseradish N. Salsa   E. Sage   J. Worcestershire sauce  O. Chutney

## 2020-11-09 NOTE — Progress Notes (Signed)
Cardiology Office Note    Date:  11/09/2020   ID:  [name], [name] [birth year], MRN [id number]  PCP:  Jani Gravel, MD  Cardiologist: Rozann Lesches, MD EPS: None  Chief Complaint  Patient presents with  . Cardiac follow-up    History of Present Illness:  [name] is a 84 y.o. female with history of nonischemic cardiomyopathy ejection fraction Leslie 20 to 25% but up to 40 to 45% on echo 02/2020, PAF on Eliquis 2.5 mg twice daily, CKD stage III, hypertension, nonobstructive CAD, history of CVA 03/2020 while on Eliquis but question of medication compliance.  Treated with aspirin for 30 days.  Patient last saw Dr. Domenic Polite 05/05/2020 and was doing well.   Patient comes in for f/u accompanied by her son. Has a cold that she's taking meds given by PCP. Son is unaware of what they are.  Denies chest pain, shortness of breath, edema. BP up today. Gets meds in bubble pack.     Past Medical History:  Diagnosis Date  . Asthma   . Bronchitis   . Carotid artery disease ([place])   . Coronary atherosclerosis of native coronary artery    Nonobstructive at cath 2005  . Dementia ([place])   . DM2 (diabetes mellitus, type 2) ([place])   . Essential hypertension   . Gout   . History of stroke   . Hypothyroidism   . Influenza 2013  . Nonischemic cardiomyopathy (HCC)    LVEF 20-25%  . PAF (paroxysmal atrial fibrillation) ([place])    a. diagnosed in 04/2018. Started on Eliquis for anticoagulation.   . Renal artery stenosis (HCC)    Bilateral    Past Surgical History:  Procedure Laterality Date  . ABDOMINAL HYSTERECTOMY    . CATARACT EXTRACTION W/PHACO Left 01/18/2019   Procedure: CATARACT EXTRACTION PHACO AND INTRAOCULAR LENS PLACEMENT ([place]);  Surgeon: Baruch Goldmann, MD;  Location: [facility];  Service: Ophthalmology;  Laterality: Left;  CDE: 11.99  . CATARACT EXTRACTION W/PHACO Right 02/26/2019   Procedure: CATARACT EXTRACTION PHACO AND INTRAOCULAR LENS PLACEMENT RIGHT EYE (CDE: 13.46);   Surgeon: Baruch Goldmann, MD;  Location: [facility];  Service: Ophthalmology;  Laterality: Right;    Current Medications: Current Meds  Medication Sig  . atorvastatin (LIPITOR) 10 MG tablet Take 1 tablet (10 mg total) by mouth daily at 6 PM.  . carvedilol (COREG) 6.25 MG tablet Take 1 tablet (6.25 mg total) by mouth 2 (two) times daily with a meal.  . cholecalciferol (VITAMIN D) 25 MCG (1000 UT) tablet Take 1,000 Units by mouth 2 (two) times daily.  Marland Kitchen donepezil (ARICEPT) 10 MG tablet Take 10 mg by mouth daily.  Marland Kitchen ELIQUIS 2.5 MG TABS tablet TAKE ONE TABLET BY MOUTH TWICE A DAY  . fluticasone (FLONASE) 50 MCG/ACT nasal spray Place 1 spray into both nostrils daily as needed for allergies or rhinitis.   . furosemide (LASIX) 20 MG tablet Take 1 tablet (20 mg total) by mouth daily as needed for fluid or edema (weight gain over 3 pounds in 1 week).  . hydrALAZINE (APRESOLINE) 25 MG tablet Take 3 tablets (75 mg total) by mouth every 8 (eight) hours.  . isosorbide mononitrate (IMDUR) 30 MG 24 hr tablet TAKE 1 TABLET BY MOUTH ONCE A DAY.  . levETIRAcetam (KEPPRA) 500 MG tablet Take 1 tablet (500 mg total) by mouth 2 (two) times daily.  . memantine (NAMENDA) 5 MG tablet Take 1 tablet (5 mg total) by mouth 2 (two) times  daily.  . OXYGEN Inhale 2 L into the lungs daily as needed (for shortness of breath).  . sitaGLIPtin (JANUVIA) 25 MG tablet Take 1 tablet (25 mg total) by mouth daily.     Allergies:   Yellow jacket venom [bee venom]   Social History   Socioeconomic History  . Marital status: Single    Spouse name: Not on file  . Number of children: 14  . Years of education: Not on file  . Highest education level: Not on file  Occupational History  . Occupation: Retired  Tobacco Use  . Smoking status: Never Smoker  . Smokeless tobacco: Never Used  Vaping Use  . Vaping Use: Never used  Substance and Sexual Activity  . Alcohol use: No    Alcohol/week: 0.0 standard drinks  . Drug use: No  .  Sexual activity: Never    Birth control/protection: Surgical  Other Topics Concern  . Not on file  Social History Narrative  . Not on file   Social Determinants of Health   Financial Resource Strain:   . Difficulty of Paying Living Expenses: Not on file  Food Insecurity:   . Worried About Charity fundraiser in the Last Year: Not on file  . Ran Out of Food in the Last Year: Not on file  Transportation Needs:   . Lack of Transportation (Medical): Not on file  . Lack of Transportation (Non-Medical): Not on file  Physical Activity:   . Days of Exercise per Week: Not on file  . Minutes of Exercise per Session: Not on file  Stress:   . Feeling of Stress : Not on file  Social Connections:   . Frequency of Communication with Friends and Family: Not on file  . Frequency of Social Gatherings with Friends and Family: Not on file  . Attends Religious Services: Not on file  . Active Member of Clubs or Organizations: Not on file  . Attends Archivist Meetings: Not on file  . Marital Status: Not on file     Family History:  The patient's   family history includes Asthma in her son.   ROS:   Please see the history of present illness.    ROS All other systems reviewed and are negative.   PHYSICAL EXAM:   VS:  BP (!) 152/80   Pulse 80   Ht 5\' 3"  (1.6 m)   Wt 165 lb 6.4 oz (75 kg)   SpO2 95%   BMI 29.30 kg/m   Physical Exam  GEN: Well nourished, well developed, in no acute distress  Neck: no JVD, carotid bruits, or masses Cardiac:irreg irreg; no murmurs, rubs, or gallops  Respiratory:  clear to auscultation bilaterally, normal work of breathing GI: soft, nontender, nondistended, + BS Ext: without cyanosis, clubbing, or edema, Good distal pulses bilaterally Neuro:  Alert and Oriented x 3 Psych: euthymic mood, full affect  Wt Readings from Last 3 Encounters:  11/09/20 165 lb 6.4 oz (75 kg)  05/28/20 167 lb (75.8 kg)  05/05/20 161 lb (73 kg)      Studies/Labs  Reviewed:   EKG:  EKG is not ordered today.   Recent Labs: 12/26/2019: TSH 5.282 03/10/2020: B Natriuretic Peptide 455.0 03/11/2020: ALT 11 03/16/2020: Magnesium 2.2 05/13/2020: BUN 27; Creatinine, Ser 2.17; Hemoglobin 11.2; Platelets 230; Potassium 3.7; Sodium 145   Lipid Panel    Component Value Date/Time   CHOL 134 03/11/2020 0404   TRIG 118 03/11/2020 0404   HDL 33 (L)  03/11/2020 0404   CHOLHDL 4.1 03/11/2020 0404   VLDL 24 03/11/2020 0404   LDLCALC 77 03/11/2020 0404     ASSESSMENT:    1. Nonischemic cardiomyopathy ([place])   2. PAF (paroxysmal atrial fibrillation) (HCC)   3. Stage 3b chronic kidney disease ([place])   4. History of CVA (cerebrovascular accident)      PLAN:  In order of problems listed above:  Nonischemic cardiomyopathy LVEF 40 to 45% with moderate DD on echo 03/11/2020, compensated  PAF CHA2DS2-VASc equals 8 on Eliquis 2.5 mg twice daily. Will request labs from PCP  CKD stage IIIb  CVA while on Eliquis however there was a question of medication compliance and she was placed on aspirin for 30 days.  Hypertension blood pressure elevated today but has been on multiple medications for head cold.  We will see if we can send her blood pressure cuff and have the family take her blood pressure at home.  If still elevated consider increasing carvedilol     Medication Adjustments/Labs and Tests Ordered: Current medicines are reviewed at length with the patient today.  Concerns regarding medicines are outlined above.  Medication changes, Labs and Tests ordered today are listed in the Patient Instructions below. Patient Instructions   Medication Instructions:  Your physician recommends that you continue on your current medications as directed. Please refer to the Current Medication list given to you today.  *If you need a refill on your cardiac medications before your next appointment, please call your pharmacy*  Lab Work: None ordered  today  Testing/Procedures: None ordered today  Follow-Up: At [facility], you and your health needs are our priority.  As part of our continuing mission to provide you with exceptional heart care, we have created designated Provider Care Teams.  These Care Teams include your primary Cardiologist (physician) and Advanced Practice Providers (APPs -  Physician Assistants and Nurse Practitioners) who all work together to provide you with the care you need, when you need it.  Your next appointment:   6 month(s)  The format for your next appointment:   In Person  Provider:   Rozann Lesches, MD  Other Instructions Two Gram Sodium Diet 2000 mg  What is Sodium? Sodium is a mineral found naturally in many foods. The most significant source of sodium in the diet is table salt, which is about 40% sodium.  Processed, convenience, and preserved foods also contain a large amount of sodium.  The body needs only 500 mg of sodium daily to function,  A normal diet provides more than enough sodium even if you do not use salt.  Why Limit Sodium? A build up of sodium in the body can cause thirst, increased blood pressure, shortness of breath, and water retention.  Decreasing sodium in the diet can reduce edema and risk of heart attack or stroke associated with high blood pressure.  Keep in mind that there are many other factors involved in these health problems.  Heredity, obesity, lack of exercise, cigarette smoking, stress and what you eat all play a role.  General Guidelines:  Do not add salt at the table or in cooking.  One teaspoon of salt contains over 2 grams of sodium.  Read food labels  Avoid processed and convenience foods  Ask your dietitian before eating any foods not dicussed in the menu planning guidelines  Consult your physician if you wish to use a salt substitute or a sodium containing medication such as antacids.  Limit milk and milk  products to 16 oz (2 cups) per day.  Shopping  Hints:  READ LABELS!! "Dietetic" does not necessarily mean low sodium.  Salt and other sodium ingredients are often added to foods during processing.   Menu Planning Guidelines Food Group Choose More Often Avoid  Beverages (see also the milk group All fruit juices, low-sodium, salt-free vegetables juices, low-sodium carbonated beverages Regular vegetable or tomato juices, commercially softened water used for drinking or cooking  Breads and Cereals Enriched white, wheat, rye and pumpernickel bread, hard rolls and dinner rolls; muffins, cornbread and waffles; most dry cereals, cooked cereal without added salt; unsalted crackers and breadsticks; low sodium or homemade bread crumbs Bread, rolls and crackers with salted tops; quick breads; instant hot cereals; pancakes; commercial bread stuffing; self-rising flower and biscuit mixes; regular bread crumbs or cracker crumbs  Desserts and Sweets Desserts and sweets mad with mild should be within allowance Instant pudding mixes and cake mixes  Fats Butter or margarine; vegetable oils; unsalted salad dressings, regular salad dressings limited to 1 Tbs; light, sour and heavy cream Regular salad dressings containing bacon fat, bacon bits, and salt pork; snack dips made with instant soup mixes or processed cheese; salted nuts  Fruits Most fresh, frozen and canned fruits Fruits processed with salt or sodium-containing ingredient (some dried fruits are processed with sodium sulfites        Vegetables Fresh, frozen vegetables and low- sodium canned vegetables Regular canned vegetables, sauerkraut, pickled vegetables, and others prepared in brine; frozen vegetables in sauces; vegetables seasoned with ham, bacon or salt pork  Condiments, Sauces, Miscellaneous  Salt substitute with physician's approval; pepper, herbs, spices; vinegar, lemon or lime juice; hot pepper sauce; garlic powder, onion powder, low sodium soy sauce (1 Tbs.); low sodium condiments (ketchup,  chili sauce, mustard) in limited amounts (1 tsp.) fresh ground horseradish; unsalted tortilla chips, pretzels, potato chips, popcorn, salsa (1/4 cup) Any seasoning made with salt including garlic salt, celery salt, onion salt, and seasoned salt; sea salt, rock salt, kosher salt; meat tenderizers; monosodium glutamate; mustard, regular soy sauce, barbecue, sauce, chili sauce, teriyaki sauce, steak sauce, Worcestershire sauce, and most flavored vinegars; canned gravy and mixes; regular condiments; salted snack foods, olives, picles, relish, horseradish sauce, catsup   Food preparation: Try these seasonings Meats:    Pork Sage, onion Serve with applesauce  Chicken Poultry seasoning, thyme, parsley Serve with cranberry sauce  Lamb Curry powder, rosemary, garlic, thyme Serve with mint sauce or jelly  Veal Marjoram, basil Serve with current jelly, cranberry sauce  Beef Pepper, bay leaf Serve with dry mustard, unsalted chive butter  Fish [place] leaf, dill Serve with unsalted lemon butter, unsalted parsley butter  Vegetables:    Asparagus Lemon juice   Broccoli Lemon juice   Carrots Mustard dressing parsley, mint, nutmeg, glazed with unsalted butter and sugar   Green beans Marjoram, lemon juice, nutmeg,dill seed   Tomatoes Basil, marjoram, onion   Spice /blend for [employer]" 4 tsp ground thyme 1 tsp ground sage 3 tsp ground rosemary 4 tsp ground marjoram   Test your knowledge 1. A product that says "Salt Free" may still contain sodium. True or False 2. Garlic Powder and Hot Pepper Sauce an be used as alternative seasonings.True or False 3. Processed foods have more sodium than fresh foods.  True or False 4. Canned Vegetables have less sodium than froze True or False  WAYS TO DECREASE YOUR SODIUM INTAKE 1. Avoid the use of added salt in cooking and at the  table.  Table salt (and other prepared seasonings which contain salt) is probably one of the greatest sources of sodium in the diet.  Unsalted  foods can gain flavor from the sweet, sour, and butter taste sensations of herbs and spices.  Instead of using salt for seasoning, try the following seasonings with the foods listed.  Remember: how you use them to enhance natural food flavors is limited only by your creativity... Allspice-Meat, fish, eggs, fruit, peas, red and yellow vegetables Almond Extract-Fruit baked goods Anise Seed-Sweet breads, fruit, carrots, beets, cottage cheese, cookies (tastes like licorice) Basil-Meat, fish, eggs, vegetables, rice, vegetables salads, soups, sauces Bay Leaf-Meat, fish, stews, poultry Burnet-Salad, vegetables (cucumber-like flavor) Caraway Seed-Bread, cookies, cottage cheese, meat, vegetables, cheese, rice Cardamon-Baked goods, fruit, soups Celery Powder or seed-Salads, salad dressings, sauces, meatloaf, soup, bread.Do not use  celery salt Chervil-Meats, salads, fish, eggs, vegetables, cottage cheese (parsley-like flavor) Chili Power-Meatloaf, chicken cheese, corn, eggplant, egg dishes Chives-Salads cottage cheese, egg dishes, soups, vegetables, sauces Cilantro-Salsa, casseroles Cinnamon-Baked goods, fruit, pork, lamb, chicken, carrots Cloves-Fruit, baked goods, fish, pot roast, green beans, beets, carrots Coriander-Pastry, cookies, meat, salads, cheese (lemon-orange flavor) Cumin-Meatloaf, fish,cheese, eggs, cabbage,fruit pie (caraway flavor) [employer], fruit, eggs, fish, poultry, cottage cheese, vegetables Dill Seed-Meat, cottage cheese, poultry, vegetables, fish, salads, bread Fennel Seed-Bread, cookies, apples, pork, eggs, fish, beets, cabbage, cheese, Licorice-like flavor Garlic-(buds or powder) Salads, meat, poultry, fish, bread, butter, vegetables, potatoes.Do not  use garlic salt Ginger-Fruit, vegetables, baked goods, meat, fish, poultry Horseradish Root-Meet, vegetables, butter Lemon Juice or Extract-Vegetables, fruit, tea, baked goods, fish salads Mace-Baked goods fruit,  vegetables, fish, poultry (taste like nutmeg) Maple Extract-Syrups Marjoram-Meat, chicken, fish, vegetables, breads, green salads (taste like Sage) Mint-Tea, lamb, sherbet, vegetables, desserts, carrots, cabbage Mustard, Dry or Seed-Cheese, eggs, meats, vegetables, poultry Nutmeg-Baked goods, fruit, chicken, eggs, vegetables, desserts Onion Powder-Meat, fish, poultry, vegetables, cheese, eggs, bread, rice salads (Do not use   Onion salt) Orange Extract-Desserts, baked goods Oregano-Pasta, eggs, cheese, onions, pork, lamb, fish, chicken, vegetables, green salads Paprika-Meat, fish, poultry, eggs, cheese, vegetables Parsley Flakes-Butter, vegetables, meat fish, poultry, eggs, bread, salads (certain forms may   Contain sodium Pepper-Meat fish, poultry, vegetables, eggs Peppermint Extract-Desserts, baked goods Poppy Seed-Eggs, bread, cheese, fruit dressings, baked goods, noodles, vegetables, cottage  [employer], poultry, meat, fish, cauliflower, turnips,eggs bread Saffron-Rice, bread, veal, chicken, fish, eggs Sage-Meat, fish, poultry, onions, eggplant, tomateos, pork, stews Savory-Eggs, salads, poultry, meat, rice, vegetables, soups, pork Tarragon-Meat, poultry, fish, eggs, butter, vegetables (licorice-like flavor)  Thyme-Meat, poultry, fish, eggs, vegetables, (clover-like flavor), sauces, soups Tumeric-Salads, butter, eggs, fish, rice, vegetables (saffron-like flavor) Vanilla Extract-Baked goods, candy Vinegar-Salads, vegetables, meat marinades Walnut Extract-baked goods, candy  2. Choose your Foods Wisely   The following is a list of foods to avoid which are high in sodium:  Meats-Avoid all smoked, canned, salt cured, dried and kosher meat and fish as well as Anchovies   [employer] meats:Bologna, Liverwurst, Pastrami Canned meat or fish  Marinated herring Caviar    Pepperoni Corned Beef   Pizza Dried chipped beef  Salami Frozen  breaded fish or meat Salt pork Frankfurters or hot dogs  Sardines Gefilte fish   Sausage Ham (boiled ham, Proscuitto Smoked butt    spiced ham)   Spam      TV Dinners Vegetables Canned vegetables (Regular) Relish Canned mushrooms  Sauerkraut Olives    Tomato juice Pickles  Bakery and Dessert Products Canned puddings  Cream pies Cheesecake  Decorated cakes Cookies  Beverages/Juices Tomato juice, regular  Gatorade   V-8 vegetable juice, regular  Breads and Cereals Biscuit mixes   Salted potato chips, corn chips, pretzels Bread stuffing mixes  Salted crackers and rolls Pancake and waffle mixes Self-rising flour  Seasonings Accent    Meat sauces Barbecue sauce  Meat tenderizer Catsup    Monosodium glutamate (MSG) Celery salt   Onion salt Chili sauce   Prepared mustard Garlic salt   Salt, seasoned salt, sea salt Gravy mixes   Soy sauce Horseradish   Steak sauce Ketchup   Tartar sauce Lite salt    Teriyaki sauce Marinade mixes   Worcestershire sauce  Others Baking powder   Cocoa and cocoa mixes Baking soda   Commercial casserole mixes Candy-caramels, chocolate  Dehydrated soups    Bars, fudge,nougats  Instant rice and pasta mixes Canned broth or soup  Maraschino cherries Cheese, aged and processed cheese and cheese spreads  Learning Assessment Quiz  Indicated T (for True) or F (for False) for each of the following statements:  1. _____ Fresh fruits and vegetables and unprocessed grains are generally low in sodium 2. _____ Water may contain a considerable amount of sodium, depending on the source 3. _____ You can always tell if a food is high in sodium by tasting it 4. _____ Certain laxatives my be high in sodium and should be avoided unless prescribed   by a physician or pharmacist 5. _____ Salt substitutes may be used freely by anyone on a sodium restricted diet 6. _____ Sodium is present in table salt, food additives and as a natural component of   most  foods 7. _____ Table salt is approximately 90% sodium 8. _____ Limiting sodium intake may help prevent excess fluid accumulation in the body 9. _____ On a sodium-restricted diet, seasonings such as bouillon soy sauce, and    cooking wine should be used in place of table salt 10. _____ On an ingredient list, a product which lists monosodium glutamate as the first   ingredient is an appropriate food to include on a low sodium diet  Circle the best answer(s) to the following statements (Hint: there may be more than one correct answer)  11. On a low-sodium diet, some acceptable snack items are:    A. Olives  F. Bean dip   K. Grapefruit juice    B. Salted Pretzels G. Commercial Popcorn   L. Canned peaches    C. Carrot Sticks  H. Bouillon   M. Unsalted nuts   D. [place] fries  I. Peanut butter crackers N. Salami   E. Sweet pickles J. Tomato Juice   O. Pizza  12.  Seasonings that may be used freely on a reduced - sodium diet include   A. Lemon wedges F.Monosodium glutamate K. Celery seed    B.Soysauce   G. Pepper   L. Mustard powder   C. Sea salt  H. Cooking wine  M. Onion flakes   D. Vinegar  E. Prepared horseradish N. Salsa   E. Sage   J. Worcestershire sauce  O. Chutney      Signed, Rozann Lesches, MD  11/09/2020 2:49 PM    [address], [place], [place]  [id number] Phone: [phone]; Fax: [phone]

## 2020-11-09 NOTE — Telephone Encounter (Signed)
CSW referred to assist patient with obtaining a BP cuff. CSW contacted patient to inform cuff will be delivered to home. Patient grateful for support and assistance. CSW available as needed. Jackie Malai Lady, LCSW, CCSW-MCS [phone]  

## 2021-01-05 ENCOUNTER — Other Ambulatory Visit: Payer: Self-pay | Admitting: Cardiology

## 2021-01-12 ENCOUNTER — Ambulatory Visit (HOSPITAL_COMMUNITY)
Admission: RE | Admit: 2021-01-12 | Discharge: 2021-01-12 | Disposition: A | Discharge status: Home / Self Care | Payer: Medicare Other | Source: Ambulatory Visit | Attending: Family Medicine | Admitting: Family Medicine

## 2021-01-12 ENCOUNTER — Other Ambulatory Visit: Payer: Self-pay

## 2021-01-12 ENCOUNTER — Other Ambulatory Visit (HOSPITAL_COMMUNITY): Payer: Self-pay | Admitting: Family Medicine

## 2021-01-12 ENCOUNTER — Encounter (HOSPITAL_COMMUNITY): Payer: Self-pay | Admitting: *Deleted

## 2021-01-12 ENCOUNTER — Emergency Department (HOSPITAL_COMMUNITY)
Admission: EM | Admit: 2021-01-12 | Discharge: 2021-01-12 | Disposition: A | Discharge status: Home / Self Care | Payer: Medicare Other | Attending: Emergency Medicine | Admitting: Emergency Medicine

## 2021-01-12 DIAGNOSIS — R55 Syncope and collapse: Secondary | ICD-10-CM | POA: Insufficient documentation

## 2021-01-12 DIAGNOSIS — J189 Pneumonia, unspecified organism: Secondary | ICD-10-CM

## 2021-01-12 DIAGNOSIS — Z5321 Procedure and treatment not carried out due to patient leaving prior to being seen by health care provider: Secondary | ICD-10-CM | POA: Insufficient documentation

## 2021-01-12 LAB — BASIC METABOLIC PANEL
Anion gap: 8 (ref 5–15)
BUN: 53 mg/dL — ABNORMAL HIGH (ref 8–23)
CO2: 22 mmol/L (ref 22–32)
Calcium: 9.5 mg/dL (ref 8.9–10.3)
Chloride: 118 mmol/L — ABNORMAL HIGH (ref 98–111)
Creatinine, Ser: 3.24 mg/dL — ABNORMAL HIGH (ref 0.44–1.00)
GFR, Estimated: 14 mL/min — ABNORMAL LOW (ref >60)
Glucose, Bld: 126 mg/dL — ABNORMAL HIGH (ref 70–99)
Potassium: 4.1 mmol/L (ref 3.5–5.1)
Sodium: 148 mmol/L — ABNORMAL HIGH (ref 135–145)

## 2021-01-12 LAB — CBC
HCT: 37.3 % (ref 36.0–46.0)
Hemoglobin: 10.7 g/dL — ABNORMAL LOW (ref 12.0–15.0)
MCH: 25.2 pg — ABNORMAL LOW (ref 26.0–34.0)
MCHC: 28.7 g/dL — ABNORMAL LOW (ref 30.0–36.0)
MCV: 88 fL (ref 80.0–100.0)
Platelets: 270 10*3/uL (ref 150–400)
RBC: 4.24 MIL/uL (ref 3.87–5.11)
RDW: 15.1 % (ref 11.5–15.5)
WBC: 5 10*3/uL (ref 4.0–10.5)
nRBC: 0 % (ref 0.0–0.2)

## 2021-01-12 LAB — CBG MONITORING, ED: Glucose-Capillary: 120 mg/dL — ABNORMAL HIGH (ref 70–99)

## 2021-01-12 NOTE — ED Triage Notes (Signed)
Pt states she had LOC, unable to state what she was doing at time that it occurred. Denies any pain at present.

## 2021-01-13 ENCOUNTER — Ambulatory Visit: Payer: Self-pay | Admitting: *Deleted

## 2021-01-13 NOTE — Telephone Encounter (Signed)
Patient's daughter called to request to call patient's brother that is caring for patient at this time. Patient is status post positive for covid approx 2 weeks ago. Coughing up brown colored sputum per patient son. Patient is now having diarrhea x 2-3 days and vomiting x 2 today. Patient is becoming lethargic per son. Patient unable to eat or drink. Poor po intake  And c/o abdominal pain. Patient's son attempted to take patient to ED last night and long wait time caused patient's son to take her back home and try to force fluids again without success. Patient's son reports patient is worse. Instructed patient's son to take patient back ot ED or call EMS for transport if he can not assist her to ED. Care advise given. Patient's son verbalized understanding of care advise and to go to ED or call EMS. Patient reports he will try fluids again and if she will not take in fluids he will call EMS.  Reason for Disposition . [1] Drinking very little AND [2] dehydration suspected (e.g., no urine > 12 hours, very dry mouth, very lightheaded)  Answer Assessment - Initial Assessment Questions 1. DIARRHEA SEVERITY: "How bad is the diarrhea?" "How many extra stools have you had in the past 24 hours than normal?"    - NO DIARRHEA (SCALE 0)   - MILD (SCALE 1-3): Few loose or mushy BMs; increase of 1-3 stools over normal daily number of stools; mild increase in ostomy output.   -  MODERATE (SCALE 4-7): Increase of 4-6 stools daily over normal; moderate increase in ostomy output. * SEVERE (SCALE 8-10; OR 'WORST POSSIBLE'): Increase of 7 or more stools daily over normal; moderate increase in ostomy output; incontinence.     Mild  2. ONSET: "When did the diarrhea begin?"      2-3 days ago  3. BM CONSISTENCY: "How loose or watery is the diarrhea?"      Watery  4. VOMITING: "Are you also vomiting?" If Yes, ask: "How many times in the past 24 hours?"      Yes . 2 times today  5. ABDOMINAL PAIN: "Are you having any  abdominal pain?" If Yes, ask: "What does it feel like?" (e.g., crampy, dull, intermittent, constant)      Yes does not describe it per son [name] 6. ABDOMINAL PAIN SEVERITY: If present, ask: "How bad is the pain?"  (e.g., Scale 1-10; mild, moderate, or severe)   - MILD (1-3): doesn't interfere with normal activities, abdomen soft and not tender to touch    - MODERATE (4-7): interferes with normal activities or awakens from sleep, tender to touch    - SEVERE (8-10): excruciating pain, doubled over, unable to do any normal activities       Will not eat or drink 7. ORAL INTAKE: If vomiting, "Have you been able to drink liquids?" "How much fluids have you had in the past 24 hours?"     No  8. HYDRATION: "Any signs of dehydration?" (e.g., dry mouth [not just dry lips], too weak to stand, dizziness, new weight loss) "When did you last urinate?"     Dizzy, poor po intake  9. EXPOSURE: "Have you traveled to a foreign country recently?" "Have you been exposed to anyone with diarrhea?" "Could you have eaten any food that was spoiled?"     na 10. ANTIBIOTIC USE: "Are you taking antibiotics now or have you taken antibiotics in the past 2 months?"       Have taken within the  past 2 months 11. OTHER SYMPTOMS: "Do you have any other symptoms?" (e.g., fever, blood in stool)       Status post covid , lethargic and fatigue per family. Coughing up brown sputum 12. PREGNANCY: "Is there any chance you are pregnant?" "When was your last menstrual period?"       na  Protocols used: [facility]

## 2021-01-14 ENCOUNTER — Encounter (HOSPITAL_COMMUNITY): Payer: Self-pay

## 2021-01-14 ENCOUNTER — Other Ambulatory Visit: Payer: Self-pay

## 2021-01-14 ENCOUNTER — Emergency Department (HOSPITAL_COMMUNITY): Payer: Medicare Other

## 2021-01-14 ENCOUNTER — Emergency Department (HOSPITAL_COMMUNITY)
Admission: EM | Admit: 2021-01-14 | Discharge: 2021-01-14 | Disposition: A | Discharge status: Home / Self Care | Payer: Medicare Other | Attending: Emergency Medicine | Admitting: Emergency Medicine

## 2021-01-14 DIAGNOSIS — Z7984 Long term (current) use of oral hypoglycemic drugs: Secondary | ICD-10-CM | POA: Insufficient documentation

## 2021-01-14 DIAGNOSIS — R059 Cough, unspecified: Secondary | ICD-10-CM | POA: Insufficient documentation

## 2021-01-14 DIAGNOSIS — E039 Hypothyroidism, unspecified: Secondary | ICD-10-CM | POA: Diagnosis not present

## 2021-01-14 DIAGNOSIS — R197 Diarrhea, unspecified: Secondary | ICD-10-CM | POA: Insufficient documentation

## 2021-01-14 DIAGNOSIS — E1122 Type 2 diabetes mellitus with diabetic chronic kidney disease: Secondary | ICD-10-CM | POA: Diagnosis not present

## 2021-01-14 DIAGNOSIS — J45901 Unspecified asthma with (acute) exacerbation: Secondary | ICD-10-CM | POA: Insufficient documentation

## 2021-01-14 DIAGNOSIS — R093 Abnormal sputum: Secondary | ICD-10-CM | POA: Diagnosis not present

## 2021-01-14 DIAGNOSIS — I5042 Chronic combined systolic (congestive) and diastolic (congestive) heart failure: Secondary | ICD-10-CM | POA: Insufficient documentation

## 2021-01-14 DIAGNOSIS — Z79899 Other long term (current) drug therapy: Secondary | ICD-10-CM | POA: Insufficient documentation

## 2021-01-14 DIAGNOSIS — F0390 Unspecified dementia without behavioral disturbance: Secondary | ICD-10-CM | POA: Insufficient documentation

## 2021-01-14 DIAGNOSIS — R531 Weakness: Secondary | ICD-10-CM

## 2021-01-14 DIAGNOSIS — R7989 Other specified abnormal findings of blood chemistry: Secondary | ICD-10-CM | POA: Diagnosis not present

## 2021-01-14 DIAGNOSIS — Z7901 Long term (current) use of anticoagulants: Secondary | ICD-10-CM | POA: Diagnosis not present

## 2021-01-14 DIAGNOSIS — R1110 Vomiting, unspecified: Secondary | ICD-10-CM | POA: Insufficient documentation

## 2021-01-14 DIAGNOSIS — I130 Hypertensive heart and chronic kidney disease with heart failure and stage 1 through stage 4 chronic kidney disease, or unspecified chronic kidney disease: Secondary | ICD-10-CM | POA: Diagnosis not present

## 2021-01-14 DIAGNOSIS — I2510 Atherosclerotic heart disease of native coronary artery without angina pectoris: Secondary | ICD-10-CM | POA: Insufficient documentation

## 2021-01-14 DIAGNOSIS — R630 Anorexia: Secondary | ICD-10-CM | POA: Diagnosis not present

## 2021-01-14 DIAGNOSIS — M6281 Muscle weakness (generalized): Secondary | ICD-10-CM | POA: Diagnosis not present

## 2021-01-14 DIAGNOSIS — N183 Chronic kidney disease, stage 3 unspecified: Secondary | ICD-10-CM | POA: Diagnosis not present

## 2021-01-14 LAB — COMPREHENSIVE METABOLIC PANEL
ALT: 11 U/L (ref 0–44)
AST: 19 U/L (ref 15–41)
Albumin: 3 g/dL — ABNORMAL LOW (ref 3.5–5.0)
Alkaline Phosphatase: 79 U/L (ref 38–126)
Anion gap: 6 (ref 5–15)
BUN: 47 mg/dL — ABNORMAL HIGH (ref 8–23)
CO2: 22 mmol/L (ref 22–32)
Calcium: 9.4 mg/dL (ref 8.9–10.3)
Chloride: 122 mmol/L — ABNORMAL HIGH (ref 98–111)
Creatinine, Ser: 3.08 mg/dL — ABNORMAL HIGH (ref 0.44–1.00)
GFR, Estimated: 15 mL/min — ABNORMAL LOW (ref >60)
Glucose, Bld: 105 mg/dL — ABNORMAL HIGH (ref 70–99)
Potassium: 3.9 mmol/L (ref 3.5–5.1)
Sodium: 150 mmol/L — ABNORMAL HIGH (ref 135–145)
Total Bilirubin: 0.7 mg/dL (ref 0.3–1.2)
Total Protein: 7 g/dL (ref 6.5–8.1)

## 2021-01-14 LAB — CBC WITH DIFFERENTIAL/PLATELET
Abs Immature Granulocytes: 0.01 10*3/uL (ref 0.00–0.07)
Basophils Absolute: 0 10*3/uL (ref 0.0–0.1)
Basophils Relative: 1 %
Eosinophils Absolute: 0 10*3/uL (ref 0.0–0.5)
Eosinophils Relative: 0 %
HCT: 37.1 % (ref 36.0–46.0)
Hemoglobin: 10.3 g/dL — ABNORMAL LOW (ref 12.0–15.0)
Immature Granulocytes: 0 %
Lymphocytes Relative: 28 %
Lymphs Abs: 1.5 10*3/uL (ref 0.7–4.0)
MCH: 24.9 pg — ABNORMAL LOW (ref 26.0–34.0)
MCHC: 27.8 g/dL — ABNORMAL LOW (ref 30.0–36.0)
MCV: 89.6 fL (ref 80.0–100.0)
Monocytes Absolute: 0.5 10*3/uL (ref 0.1–1.0)
Monocytes Relative: 9 %
Neutro Abs: 3.3 10*3/uL (ref 1.7–7.7)
Neutrophils Relative %: 62 %
Platelets: 209 10*3/uL (ref 150–400)
RBC: 4.14 MIL/uL (ref 3.87–5.11)
RDW: 15.2 % (ref 11.5–15.5)
WBC: 5.3 10*3/uL (ref 4.0–10.5)
nRBC: 0 % (ref 0.0–0.2)

## 2021-01-14 MED ORDER — ONDANSETRON HCL 4 MG/2ML IJ SOLN
4.0000 mg | Freq: Once | INTRAMUSCULAR | Status: AC
  Administered 2021-01-14: 4 mg via INTRAVENOUS
  Filled 2021-01-14: qty 2

## 2021-01-14 MED ORDER — GUAIFENESIN-DM 100-10 MG/5ML PO SYRP: 5.0000 mL | ORAL_SOLUTION | ORAL | 0 refills | Status: DC | PRN

## 2021-01-14 MED ORDER — SODIUM CHLORIDE 0.9 % IV BOLUS
500.0000 mL | Freq: Once | INTRAVENOUS | Status: AC
  Administered 2021-01-14: 500 mL via INTRAVENOUS

## 2021-01-14 NOTE — ED Provider Notes (Signed)
  Face-to-face evaluation   History: Patient presents for decreased oral intake, and possibly a period of syncope.  She lives with different family members at different times.  Currently in the ED she is with her granddaughter who states that the patient has been ill, "like this," since she had a Covid infection, about 16 days ago.  She has since had "a negative Covid test."  Patient continues to cough.  She is reported to have vomiting and diarrhea in the last 2 days.  Yesterday, patient son called the PCP who advised that the patient should come to the ED, to be checked out if she cannot tolerate liquids.  Family members chose to bring her here today because she had decreased appetite still, and reportedly had a syncopal episode.  Patient cannot give history.  Physical exam: Here with granddaughter in the ED.  She is currently coughing, the cough is nonproductive.  No respiratory distress.  Abdomen is soft and nontender to palpation.  Medical screening examination/treatment/procedure(s) were conducted as a shared visit with non-physician practitioner(s) and myself.  I personally evaluated the patient during the encounter    Daleen Bo, MD 01/18/21 [phone]

## 2021-01-14 NOTE — ED Provider Notes (Signed)
[facility] EMERGENCY DEPARTMENT Provider Note   CSN: [id number] Arrival date & time: 01/14/21  1615     History Chief Complaint  Patient presents with  . Cough    [name] is a 85 y.o. female.  HPI      [name] is a 85 y.o. female with past medical history significant for dementia, type 2 diabetes, hypertension, asthma, and paroxysmal atrial fibrillation who comes from home via EMS to the Emergency Department for evaluation of cough and generalized weakness.  Patient states that she has been coughing for several weeks.  Patient is poor historian and unable to provide additional historical information.   She reports that she is unsure why she is here. Granddaughter also at bedside and reports patient Covid positive mid January, had negative repeat Covid testing on 01/12/2021.  Patient unvaccinated.   Spoke with patient's son, [name], who reports patient has decreased appetite, increasing generalized weakness, and abdominal pain with intermittent diarrhea and vomiting.  He states symptoms have been present since her Covid diagnosis.  States he has tried multiple times to get her to eat but she only takes small amounts of food and sips of water.  He reports her cough has been continuous and productive of dark brown-colored sputum.  He contacted PCPs office and patient was arranged to have chest x-ray Tuesday to evaluate for possible Covid related pneumonia.  Patient son also states that he brought her to the emergency room last evening but left before being seen due to extended wait time.  He denies known fever, witnessed black or bloody stools, or reports of shortness of breath.   Past Medical History:  Diagnosis Date  . Asthma   . Bronchitis   . Carotid artery disease ([place])   . Coronary atherosclerosis of native coronary artery    Nonobstructive at cath 2005  . Dementia ([place])   . DM2 (diabetes mellitus, type 2) ([place])   . Essential hypertension   . Gout   . History of  stroke   . Hypothyroidism   . Influenza 2013  . Nonischemic cardiomyopathy (HCC)    LVEF 20-25%  . PAF (paroxysmal atrial fibrillation) ([employer])    a. diagnosed in 04/2018. Started on Eliquis for anticoagulation.   . Renal artery stenosis [facility])    Bilateral    Patient Active Problem List   Diagnosis Date Noted  . Goals of care, counseling/discussion   . Palliative care by specialist   . Left-sided weakness 03/10/2020  . Aspiration pneumonia of lower lobe ([place])   . Urinary tract infection without hematuria   . Acute metabolic encephalopathy 67/59/1638  . Syncope and collapse 05/17/2018  . Chronic combined systolic and diastolic CHF (congestive heart failure) ([place]) 05/16/2018  . Acute renal failure superimposed on stage 3 chronic kidney disease ([place]) 05/16/2018  . Atrial fibrillation, chronic ([place]) 05/14/2018  . Atrial fibrillation with RVR ([place]) 04/24/2018  . Hypokalemia 04/24/2018  . Acute exacerbation of CHF (congestive heart failure) ([place]) 09/01/2015  . CHF (congestive heart failure) ([place]) 07/02/2015  . Elevated troponin I level 07/02/2015  . Right bundle branch block 07/02/2015  . Renal insufficiency 07/02/2015  . CAP (community acquired pneumonia) 04/25/2015  . Acute respiratory failure with hypoxia ([place]) 04/25/2015  . Acute renal injury ([place]) 04/25/2015  . Type 2 diabetes with nephropathy ([place]) 04/25/2015  . ARF (acute renal failure) ([place]) 06/18/2014  . Mitral regurgitation 06/09/2014  . Dyspnea 06/06/2014  . Asthma exacerbation 06/06/2014  . Acute on chronic  combined systolic and diastolic CHF (congestive heart failure) ([place]) 06/06/2014  . Influenza 12/10/2012  . Type 2 diabetes mellitus with hyperlipidemia ([place]) 07/08/2010  . Essential hypertension 04/06/2010  . CORONARY ATHEROSCLEROSIS NATIVE CORONARY ARTERY 04/06/2010  . CARDIOMYOPATHY 03/29/2010  . CVA (cerebral vascular accident) ([place]) 03/29/2010    Past Surgical History:  Procedure Laterality Date  .  ABDOMINAL HYSTERECTOMY    . CATARACT EXTRACTION W/PHACO Left 01/18/2019   Procedure: CATARACT EXTRACTION PHACO AND INTRAOCULAR LENS PLACEMENT ([place]);  Surgeon: Baruch Goldmann, MD;  Location: [facility];  Service: Ophthalmology;  Laterality: Left;  CDE: 11.99  . CATARACT EXTRACTION W/PHACO Right 02/26/2019   Procedure: CATARACT EXTRACTION PHACO AND INTRAOCULAR LENS PLACEMENT RIGHT EYE (CDE: 13.46);  Surgeon: Baruch Goldmann, MD;  Location: [facility];  Service: Ophthalmology;  Laterality: Right;     OB History    Gravida  15   Para  15   Term  15   Preterm      AB      Living        SAB      IAB      Ectopic      Multiple      Live Births              Family History  Problem Relation Age of Onset  . Asthma Son     Social History   Tobacco Use  . Smoking status: Never Smoker  . Smokeless tobacco: Never Used  Vaping Use  . Vaping Use: Never used  Substance Use Topics  . Alcohol use: No    Alcohol/week: 0.0 standard drinks  . Drug use: No    Home Medications Prior to Admission medications   Medication Sig Start Date End Date Taking? Authorizing Provider  atorvastatin (LIPITOR) 10 MG tablet Take 1 tablet (10 mg total) by mouth daily at 6 PM. 03/17/20   Johnson, Clanford L, MD  carvedilol (COREG) 6.25 MG tablet Take 1 tablet (6.25 mg total) by mouth 2 (two) times daily with a meal. 03/17/20   Johnson, Clanford L, MD  cholecalciferol (VITAMIN D) 25 MCG (1000 UT) tablet Take 1,000 Units by mouth 2 (two) times daily.    [provider]  donepezil (ARICEPT) 10 MG tablet Take 10 mg by mouth daily.    [provider]  ELIQUIS 2.5 MG TABS tablet TAKE ONE TABLET BY MOUTH TWICE A DAY Patient not taking: Reported on 01/12/2021 01/05/21   Satira Sark, MD  fluticasone [facility]) 50 MCG/ACT nasal spray Place 1 spray into [name] nostrils daily as needed for allergies or rhinitis.  01/03/19   [provider]  furosemide (LASIX) 20 MG tablet Take 1 tablet (20 mg  total) by mouth daily as needed for fluid or edema (weight gain over 3 pounds in 1 week). Patient not taking: Reported on 01/12/2021 03/17/20   Murlean Iba, MD  hydrALAZINE (APRESOLINE) 25 MG tablet Take 3 tablets (75 mg total) by mouth every 8 (eight) hours. 03/17/20   Johnson, Clanford L, MD  isosorbide mononitrate (IMDUR) 30 MG 24 hr tablet TAKE 1 TABLET BY MOUTH ONCE A DAY. 10/08/20   Satira Sark, MD  levETIRAcetam (KEPPRA) 500 MG tablet Take 1 tablet (500 mg total) by mouth 2 (two) times daily. 01/01/20   Barton Dubois, MD  memantine (NAMENDA) 5 MG tablet Take 1 tablet (5 mg total) by mouth 2 (two) times daily. 03/17/20   Murlean Iba, MD  OXYGEN Inhale 2 L  into the lungs daily as needed (for shortness of breath).    [provider]  sitaGLIPtin (JANUVIA) 25 MG tablet Take 1 tablet (25 mg total) by mouth daily. 03/17/20   Johnson, Clanford L, MD  torsemide (DEMADEX) 5 MG tablet Take 5 mg by mouth daily.    [provider]    Allergies    Yellow jacket venom [bee venom]  Review of Systems   Review of Systems  Unable to perform ROS: Dementia    Physical Exam Updated Vital Signs BP (!) 158/83 (BP Location: Right Arm)   Pulse 68   Temp 98.7 F (37.1 C) (Oral)   Resp 20   Ht 5\' 3"  (1.6 m)   Wt 68 kg   SpO2 95%   BMI 26.57 kg/m   Physical Exam Vitals and nursing note reviewed.  Constitutional:      General: She is not in acute distress.    Appearance: Normal appearance. She is not ill-appearing.     Comments: Frail, elderly appearing  HENT:     Head: Normocephalic.     Mouth/Throat:     Mouth: Mucous membranes are moist.  Eyes:     Extraocular Movements: Extraocular movements intact.     Conjunctiva/sclera: Conjunctivae normal.     Pupils: Pupils are equal, round, and reactive to light.  Neck:     Thyroid: No thyromegaly.     Meningeal: Kernig's sign absent.  Cardiovascular:     Rate and Rhythm: Normal rate and regular rhythm.      Pulses: Normal pulses.  Pulmonary:     Effort: Pulmonary effort is normal. No respiratory distress.     Breath sounds: No wheezing.     Comments: Patient actively coughing, lung sounds coarse, improve after coughing clears.  No increased work of breathing. Abdominal:     Palpations: Abdomen is soft.     Tenderness: There is no abdominal tenderness. There is no guarding or rebound.  Musculoskeletal:        General: Normal range of motion.     Cervical back: Normal range of motion and neck supple.  Skin:    General: Skin is warm.     Capillary Refill: Capillary refill takes less than 2 seconds.     Findings: No rash.  Neurological:     General: No focal deficit present.     Mental Status: She is alert and oriented to person, place, and time.     GCS: GCS eye subscore is 4. GCS verbal subscore is 5. GCS motor subscore is 6.     Sensory: Sensation is intact. No sensory deficit.     Motor: Motor function is intact.     Coordination: Coordination is intact. Coordination normal.     Comments: CN II-XII grossly intact.  Speech slow, but clear.  Answers questions appropriately.  No pronator drift, or facial droop     ED Results / Procedures / Treatments   Labs (all labs ordered are listed, but only abnormal results are displayed) Labs Reviewed  COMPREHENSIVE METABOLIC PANEL - Abnormal; Notable for the following components:      Result Value   Sodium 150 (*)    Chloride 122 (*)    Glucose, Bld 105 (*)    BUN 47 (*)    Creatinine, Ser 3.08 (*)    Albumin 3.0 (*)    GFR, Estimated 15 (*)    All other components within normal limits  CBC WITH DIFFERENTIAL/PLATELET - Abnormal; Notable for the  following components:   Hemoglobin 10.3 (*)    MCH 24.9 (*)    MCHC 27.8 (*)    All other components within normal limits  URINALYSIS, ROUTINE W REFLEX MICROSCOPIC    EKG EKG Interpretation  Date/Time:  Thursday January 14 2021 17:10:51 EST Ventricular Rate:  67 PR Interval:    QRS  Duration: 170 QT Interval:  563 QTC Calculation: 595 R Axis:   41 Text Interpretation: Sinus rhythm Borderline prolonged PR interval Right bundle branch block since last tracing no significant change Confirmed by Daleen Bo [id number]) on 01/14/2021 8:06:23 PM   Radiology DG Chest Portable 1 View  Result Date: 01/14/2021 CLINICAL DATA:  85 year old female with cough and weakness. EXAM: PORTABLE CHEST 1 VIEW COMPARISON:  Chest radiograph dated 01/12/2021. FINDINGS: Mild cardiomegaly with mild central vascular congestion. No focal consolidation, pleural effusion or pneumothorax. Atherosclerotic calcification of the aorta. No acute osseous pathology. IMPRESSION: Mild cardiomegaly with mild central vascular congestion. No focal consolidation. Electronically Signed   By: Anner Crete M.D.   On: 01/14/2021 17:07    Procedures Procedures   Medications Ordered in ED Medications  sodium chloride 0.9 % bolus 500 mL (500 mLs Intravenous New Bag/Given 01/14/21 1805)  ondansetron (ZOFRAN) injection 4 mg (4 mg Intravenous Given 01/14/21 1806)    ED Course  I have reviewed the triage vital signs and the nursing notes.  Pertinent labs & imaging results that were available during my care of the patient were reviewed by me and considered in my medical decision making (see chart for details).   Orthostatic VS for the past 24 hrs:  BP- Lying Pulse- Lying BP- Sitting Pulse- Sitting BP- Standing at 0 minutes Pulse- Standing at 0 minutes  01/14/21 2028 (!) 150/96 58 147/84 64 131/62 70       MDM Rules/Calculators/A&P                          Patient comes from home for evaluation of 4 to 5-day history of decreased appetite and decreased p.o. intake, cough productive of dark sputum intermittent diarrhea and vomiting.  Per phone conversation with patient's son who reported patient had brief syncopal episode 2 days ago when attempting to walk to the car.  Patient and granddaughter at bedside [name] deny this.   She has no focal neurological deficits.  Patient recovering from Covid.  On exam she is a frail elderly female.  Abdomen is soft nontender.  Vital signs are reassuring.  No orthostatic hypotension.  Given IVF's and antiemetic here.  She has drink several cups of fluids here without difficulty.  No diarrhea or vomiting during ER stay.  Labs today show hyponatremia which appears to be baseline, serum creatinine elevated, appears baseline.  No leukocytosis.  chest x-ray without focal consolidation.  No acute EKG changes.  Patient also seen by Dr. Eulis Foster and care plan discussed.  Discussed care plan with patient and with granddaughter who is at bedside.  I have offered hospital admission, but patient declines and states that she wants to go home with her granddaughter.  Granddaughter states that she will take her home with her.   Final Clinical Impression(s) / ED Diagnoses Final diagnoses:  Cough  Generalized weakness    Rx / DC Orders ED Discharge Orders    None       Kem Parkinson, PA-C 01/14/21 2244    Daleen Bo, MD 01/18/21 [phone]

## 2021-01-14 NOTE — ED Triage Notes (Signed)
Pt presents to ED via [facility] for congested cough. Pt covid positive 1 month ago.

## 2021-01-14 NOTE — ED Notes (Signed)
Pt O2 dropped to 79% on RA, pt placed on 2L [place] now @94 %

## 2021-01-14 NOTE — Discharge Instructions (Addendum)
Your chest x-ray today did not show evidence of pneumonia.  Your sodium level is slightly elevated but appears to be similar to baseline.  Your symptoms are likely related to your recent Covid illness.  It is important that you continue to eat and drink.  You have been prescribed medication to help with your cough and congestion.  Please contact your primary care provider tomorrow to arrange a follow-up appointment.

## 2021-02-09 NOTE — Progress Notes (Addendum)
Cardiology Office Note:    Date:  02/10/2021   ID:  Caren Griffins, DOB 10/24/1936, MRN [id number]  PCP:  Jani Gravel, MD   [place]  Cardiologist:  Rozann Lesches, MD   Advanced Practice Provider:  None  Electrophysiologist:  None       Referring MD: Jani Gravel, MD   Chief Complaint:  Follow-up (CHF, atrial fibrillation)    Patient Profile:    BRIYA LOOKABAUGH is a 85 y.o. female with:   HFmrEF (heart failure with mildly reduced ejection fraction)   Non-ischemic cardiomyopathy   Echocardiogram 3/21: EF 40-45  Coronary artery disease   Cath: 02/2004: mild non-obs dz  Paroxysmal atrial fibrillation   Chronic kidney disease stage III  Hypertension   Hx of CVA in 4/21   Carotid artery disease   Diabetes mellitus   Hypothyroidism   Renal artery stenosis  Hyperlipidemia   Prior CV studies: Echocardiogram 03/11/20 EF 40-45, mod LVH, global HK, Gr 2 DD, normal RVSF, mild to mod MR, RVSP 53.3.  Carotid US 05/15/18 Bilat ICA <   Echocardiogram 04/2018 EF 30  Echocardiogram 06/2015 EF 20-25  Cardiac catheterization 02/2004 LAD mid 30-40 OM w superior branch ost 30 EF 30-35  History of Present Illness:    Ms. Jarnagin was last seen in clinic by Ermalinda Barrios, PA-C in 10/2020.  She is here today with her daughter and her son.  She was scheduled for follow-up because her apixaban was stopped.  I just received notes from her PCP.  In January, she presented to her PCPs office with hematuria.  She was told to hold apixaban until she followed up with [place].  She was treated for UTI.  She has not had further bleeding.  She has not had melena, hematochezia.  She has not had chest pain, shortness of breath, syncope, orthopnea or.  She has mild leg edema without significant change.      Past Medical History:  Diagnosis Date  . Asthma   . Bronchitis   . Carotid artery disease ([place])   . Coronary atherosclerosis of native coronary artery     Nonobstructive at cath 2005  . Dementia ([place])   . DM2 (diabetes mellitus, type 2) ([place])   . Essential hypertension   . Gout   . History of stroke   . Hypothyroidism   . Influenza 2013  . Nonischemic cardiomyopathy (HCC)    LVEF 20-25%  . PAF (paroxysmal atrial fibrillation) ([place])    a. diagnosed in 04/2018. Started on Eliquis for anticoagulation.   . Renal artery stenosis (HCC)    Bilateral    Current Medications: Current Meds  Medication Sig  . apixaban (ELIQUIS) 2.5 MG TABS tablet Take 1 tablet (2.5 mg total) by mouth 2 (two) times daily.  Marland Kitchen atorvastatin (LIPITOR) 10 MG tablet Take 1 tablet (10 mg total) by mouth daily at 6 PM.  . carvedilol (COREG) 6.25 MG tablet Take 1 tablet (6.25 mg total) by mouth 2 (two) times daily with a meal.  . cholecalciferol (VITAMIN D) 25 MCG (1000 UT) tablet Take 1,000 Units by mouth 2 (two) times daily.  Marland Kitchen donepezil (ARICEPT) 10 MG tablet Take 10 mg by mouth daily.  Marland Kitchen ELIQUIS 2.5 MG TABS tablet TAKE ONE TABLET BY MOUTH TWICE A DAY  . fluticasone (FLONASE) 50 MCG/ACT nasal spray Place 1 spray into both nostrils daily as needed for allergies or rhinitis.   . furosemide (LASIX) 20 MG tablet Take 1 tablet (  20 mg total) by mouth daily as needed for fluid or edema (weight gain over 3 pounds in 1 week).  Marland Kitchen guaiFENesin-dextromethorphan (ROBITUSSIN DM) 100-10 MG/5ML syrup Take 5 mLs by mouth every 4 (four) hours as needed for cough.  . hydrALAZINE (APRESOLINE) 25 MG tablet Take 3 tablets (75 mg total) by mouth every 8 (eight) hours.  . isosorbide mononitrate (IMDUR) 30 MG 24 hr tablet TAKE 1 TABLET BY MOUTH ONCE A DAY.  . levETIRAcetam (KEPPRA) 500 MG tablet Take 1 tablet (500 mg total) by mouth 2 (two) times daily.  . memantine (NAMENDA) 5 MG tablet Take 1 tablet (5 mg total) by mouth 2 (two) times daily.  . OXYGEN Inhale 2 L into the lungs daily as needed (for shortness of breath).  . sitaGLIPtin (JANUVIA) 25 MG tablet Take 1 tablet (25 mg total) by mouth  daily.  Marland Kitchen torsemide (DEMADEX) 5 MG tablet Take 5 mg by mouth daily.     Allergies:   Yellow jacket venom [bee venom]   Social History   Tobacco Use  . Smoking status: Never Smoker  . Smokeless tobacco: Never Used  Vaping Use  . Vaping Use: Never used  Substance Use Topics  . Alcohol use: No    Alcohol/week: 0.0 standard drinks  . Drug use: No     Family Hx: The patient's family history includes Asthma in her son.  ROS   EKGs/Labs/Other Test Reviewed:    EKG:  EKG is   ordered today.  The ekg ordered today demonstrates normal sinus rhythm, HR 71, normal axis, right bundle branch block, first-degree AV block, PR interval 220, QTC 510, similar to prior tracings  Recent Labs: 03/10/2020: B Natriuretic Peptide 455.0 03/16/2020: Magnesium 2.2 01/14/2021: ALT 11; BUN 47; Creatinine, Ser 3.08; Hemoglobin 10.3; Platelets 209; Potassium 3.9; Sodium 150   Recent Lipid Panel Lab Results  Component Value Date/Time   CHOL 134 03/11/2020 04:04 AM   TRIG 118 03/11/2020 04:04 AM   HDL 33 (L) 03/11/2020 04:04 AM   CHOLHDL 4.1 03/11/2020 04:04 AM   LDLCALC 77 03/11/2020 04:04 AM      Risk Assessment/Calculations:    CHA2DS2-VASc Score = 9  This indicates a 12.2% annual risk of stroke. The patient's score is based upon: CHF History: Yes HTN History: Yes Diabetes History: Yes Stroke History: Yes Vascular Disease History: Yes Age Score: 2 Gender Score: 1     Physical Exam:    VS:  BP 132/68   Pulse 76   Ht 5\' 3"  (1.6 m)   Wt 160 lb 12.8 oz (72.9 kg)   SpO2 95%   BMI 28.48 kg/m     Wt Readings from Last 3 Encounters:  02/10/21 160 lb 12.8 oz (72.9 kg)  01/14/21 150 lb (68 kg)  11/09/20 165 lb 6.4 oz (75 kg)     Constitutional:      Appearance: Healthy appearance. Not in distress.  Neck:     Vascular: No JVR.  Pulmonary:     Effort: Pulmonary effort is normal.     Breath sounds: No wheezing. No rales.  Cardiovascular:     Normal rate. Regular rhythm. Normal S1.  Normal S2.     Murmurs: There is a grade 2/6 systolic murmur at the URSB.  Edema:    Pretibial: bilateral 1+ edema of the pretibial area. Abdominal:     Palpations: Abdomen is soft.  Skin:    General: Skin is warm and dry.  Neurological:  Mental Status: Alert and oriented to person, place and time.     Cranial Nerves: Cranial nerves are intact.      ASSESSMENT & PLAN:    1. HFmrEF (heart failure with mildly reduced EF) EF 40-45 by echocardiogram 3/21.  Her volume management is complicated by chronic kidney disease.  Overall, her volume seems to be stable.  Continue current dose of carvedilol, hydralazine, isosorbide.  2. Coronary artery disease involving native coronary artery of native heart without angina pectoris Nonobstructive disease by cardiac catheterization in 2005.  She is not describing anginal symptoms.  Her electrocardiogram is unchanged.  She is not on aspirin as she is on Apixaban.  Continue atorvastatin.  3. PAF (paroxysmal atrial fibrillation) (HCC) CHA2DS2-VASc Score = 9 [CHF History: Yes, HTN History: Yes, Diabetes History: Yes, Stroke History: Yes, Vascular Disease History: Yes, Age Score: 2, Gender Score: 1].  Therefore, the patient's annual risk of stroke is 12.2 %.   Maintaining sinus rhythm.  Her apixaban was stopped due to hematuria.  Given her significant risk for stroke, I recommended she resume apixaban 2.5 mg twice daily (age, renal function).  If she has recurrent hematuria, she will need referral to urology.  I discussed this with her and her son today  4. Stage 3b chronic kidney disease ([place]) Managed by nephrology.  Most recent creatinine 2.51 ([facility])  5. Essential hypertension The patient's blood pressure is controlled on her current regimen.  Continue current therapy.   6. Nonrheumatic mitral valve regurgitation Mild to moderate by echocardiogram 3/21.  Consider follow-up echocardiogram in 1 to 2 years  7. Hematuria, unspecified type As  noted, if she has recurrent hematuria, she will need referral to urology.    Dispo:  Return in about 2 months (around 04/12/2021) for Dr. Domenic Polite.   Medication Adjustments/Labs and Tests Ordered: Current medicines are reviewed at length with the patient today.  Concerns regarding medicines are outlined above.  Tests Ordered: Orders Placed This Encounter  Procedures  . Basic Metabolic Panel (BMET)  . CBC  . EKG 12-Lead   Medication Changes: Meds ordered this encounter  Medications  . apixaban (ELIQUIS) 2.5 MG TABS tablet    Sig: Take 1 tablet (2.5 mg total) by mouth 2 (two) times daily.    Dispense:  180 tablet    Refill:  2    Signed, Richardson Dopp, PA-C  02/10/2021 5:24 PM    [facility] [address], [place], [place]  [address] Phone: [phone]; Fax: [phone]

## 2021-02-10 ENCOUNTER — Encounter: Payer: Self-pay | Admitting: Physician Assistant

## 2021-02-10 ENCOUNTER — Other Ambulatory Visit: Payer: Self-pay

## 2021-02-10 ENCOUNTER — Ambulatory Visit (INDEPENDENT_AMBULATORY_CARE_PROVIDER_SITE_OTHER): Payer: Medicare Other | Admitting: Physician Assistant

## 2021-02-10 VITALS — BP 132/68 | HR 76 | Ht 63.0 in | Wt 160.8 lb

## 2021-02-10 DIAGNOSIS — I480 Paroxysmal atrial fibrillation: Secondary | ICD-10-CM | POA: Diagnosis not present

## 2021-02-10 DIAGNOSIS — I2510 Atherosclerotic heart disease of native coronary artery without angina pectoris: Secondary | ICD-10-CM | POA: Diagnosis not present

## 2021-02-10 DIAGNOSIS — R319 Hematuria, unspecified: Secondary | ICD-10-CM

## 2021-02-10 DIAGNOSIS — I10 Essential (primary) hypertension: Secondary | ICD-10-CM

## 2021-02-10 DIAGNOSIS — I5020 Unspecified systolic (congestive) heart failure: Secondary | ICD-10-CM | POA: Diagnosis not present

## 2021-02-10 DIAGNOSIS — I340 Nonrheumatic mitral (valve) insufficiency: Secondary | ICD-10-CM

## 2021-02-10 DIAGNOSIS — N1832 Chronic kidney disease, stage 3b: Secondary | ICD-10-CM | POA: Diagnosis not present

## 2021-02-10 MED ORDER — APIXABAN 2.5 MG PO TABS: 2.5000 mg | ORAL_TABLET | Freq: Two times a day (BID) | ORAL | 2 refills | Status: AC

## 2021-02-10 NOTE — Patient Instructions (Signed)
Medication Instructions:  Your physician has recommended you make the following change in your medication:  1. Restart Eliquis one tablet by mouth (2.5 mg) daily, sent in # 90 to requested pharmacy.    *If you need a refill on your cardiac medications before your next appointment, please call your pharmacy*   Lab Work: Your physician recommends that you return for lab work in 3 weeks. The week of March 23 for bmet/cbc.   If you have labs (blood work) drawn today and your tests are completely normal, you will receive your results only by: Marland Kitchen MyChart Message (if you have MyChart) OR . A paper copy in the mail If you have any lab test that is abnormal or we need to change your treatment, we will call you to review the results.   Testing/Procedures: -None   Follow-Up: At [facility], you and your health needs are our priority.  As part of our continuing mission to provide you with exceptional heart care, we have created designated Provider Care Teams.  These Care Teams include your primary Cardiologist (physician) and Advanced Practice Providers (APPs -  Physician Assistants and Nurse Practitioners) who all work together to provide you with the care you need, when you need it.  We recommend signing up for the patient portal called "MyChart".  Sign up information is provided on this After Visit Summary.  MyChart is used to connect with patients for Virtual Visits (Telemedicine).  Patients are able to view lab/test results, encounter notes, upcoming appointments, etc.  Non-urgent messages can be sent to your provider as well.   To learn more about what you can do with MyChart, go to [email or web address].    Your next appointment:   2 month(s)  The format for your next appointment:   In Person  Provider:   Rozann Lesches, MD   Other Instructions -None

## 2021-02-19 ENCOUNTER — Encounter: Payer: Self-pay | Admitting: Podiatry

## 2021-02-19 ENCOUNTER — Ambulatory Visit (INDEPENDENT_AMBULATORY_CARE_PROVIDER_SITE_OTHER): Payer: Medicare Other | Admitting: Podiatry

## 2021-02-19 ENCOUNTER — Ambulatory Visit (INDEPENDENT_AMBULATORY_CARE_PROVIDER_SITE_OTHER): Payer: Medicare Other

## 2021-02-19 ENCOUNTER — Other Ambulatory Visit: Payer: Self-pay

## 2021-02-19 DIAGNOSIS — M79675 Pain in left toe(s): Secondary | ICD-10-CM

## 2021-02-19 DIAGNOSIS — L03032 Cellulitis of left toe: Secondary | ICD-10-CM | POA: Diagnosis not present

## 2021-02-19 NOTE — Patient Instructions (Signed)

## 2021-02-21 NOTE — Progress Notes (Signed)
Subjective:   Patient ID: [name], female   DOB: 85 y.o.   MRN: [id number]   HPI Patient presents with caregiver with pain on the left hallux of several week duration with no history of trauma and patient is moderately combative and not completely lucid and patient does not smoke and has 2 caregivers with her   Review of Systems  All other systems reviewed and are negative.       Objective:  Physical Exam Vitals and nursing note reviewed.  Constitutional:      Appearance: She is well-developed.  Pulmonary:     Effort: Pulmonary effort is normal.  Musculoskeletal:        General: Normal range of motion.  Skin:    General: Skin is warm.  Neurological:     Mental Status: She is alert.     Vascular status found to be moderately diminished commensurate with her age with diminished range of motion of the subtalar midtarsal joint and muscle strength bilateral.  Patient is found to have an incurvated lateral border of the left hallux localized with local drainage no proximal edema erythema drainage noted with moderate tenderness associated with it     Assessment:  Probability that this patient has a localized paronychia infection with moderate diminishment of vascular status a possible complicating factor     Plan:  H&P reviewed condition and does not appear that she has current claudication symptomatology.  I did do precautionary x-ray I went ahead and recommended removal of the border explained procedure risk to patient and family and they want this done understanding this may or may not heal and ultimately amputation may be necessary.  I did infiltrate 60 mg like Marcaine mixture sterile prep done and using sterile instrumentation I remove the lateral border flushed it out applied sterile dressing and reduced all necrotic tissue.  They will use soaks at home and padding for the area and will be seen back as needed and I explained signs to look for of nonhealing  X-rays were  negative for signs of osteoporosis or indications of osteolysis consistent with the trauma to the left hallux

## 2021-02-25 ENCOUNTER — Telehealth: Payer: Self-pay | Admitting: Podiatry

## 2021-02-25 NOTE — Telephone Encounter (Signed)
Elmyra Ricks with [facility] went to see patient Sunday and today. Patient foot wasn't bad on Sunday but today the  Left great toe has a moderate amount of serosanguineous drainage, it is warm to the touch. Elmyra Ricks says its a deep red, almost purple at the end of the great toe. Its slightly macerated. Patient is in severe pain and her son has been making sure she has been doing the epsom salt stokes.

## 2021-02-26 NOTE — Telephone Encounter (Signed)
Soak and don't wear a shoe over the weekend. If not improved should be seen next week

## 2021-02-26 NOTE — Telephone Encounter (Signed)
Spoke to patients home health nurse and she will let the patient know the iunstructions

## 2021-03-17 ENCOUNTER — Other Ambulatory Visit: Payer: Self-pay

## 2021-03-17 ENCOUNTER — Other Ambulatory Visit (HOSPITAL_COMMUNITY)
Admission: RE | Admit: 2021-03-17 | Discharge: 2021-03-17 | Disposition: A | Discharge status: Home / Self Care | Payer: Medicare Other | Source: Ambulatory Visit | Attending: Physician Assistant | Admitting: Physician Assistant

## 2021-03-17 DIAGNOSIS — I340 Nonrheumatic mitral (valve) insufficiency: Secondary | ICD-10-CM | POA: Diagnosis present

## 2021-03-17 DIAGNOSIS — I5020 Unspecified systolic (congestive) heart failure: Secondary | ICD-10-CM | POA: Diagnosis present

## 2021-03-17 DIAGNOSIS — I10 Essential (primary) hypertension: Secondary | ICD-10-CM | POA: Diagnosis present

## 2021-03-17 DIAGNOSIS — I480 Paroxysmal atrial fibrillation: Secondary | ICD-10-CM

## 2021-03-17 DIAGNOSIS — N1832 Chronic kidney disease, stage 3b: Secondary | ICD-10-CM | POA: Diagnosis present

## 2021-03-17 DIAGNOSIS — I2510 Atherosclerotic heart disease of native coronary artery without angina pectoris: Secondary | ICD-10-CM | POA: Diagnosis present

## 2021-03-17 LAB — BASIC METABOLIC PANEL
Anion gap: 10 (ref 5–15)
BUN: 30 mg/dL — ABNORMAL HIGH (ref 8–23)
CO2: 23 mmol/L (ref 22–32)
Calcium: 9.5 mg/dL (ref 8.9–10.3)
Chloride: 110 mmol/L (ref 98–111)
Creatinine, Ser: 2.74 mg/dL — ABNORMAL HIGH (ref 0.44–1.00)
GFR, Estimated: 17 mL/min — ABNORMAL LOW (ref >60)
Glucose, Bld: 120 mg/dL — ABNORMAL HIGH (ref 70–99)
Potassium: 4.7 mmol/L (ref 3.5–5.1)
Sodium: 143 mmol/L (ref 135–145)

## 2021-03-17 LAB — CBC
HCT: 36.2 % (ref 36.0–46.0)
Hemoglobin: 10.5 g/dL — ABNORMAL LOW (ref 12.0–15.0)
MCH: 25.9 pg — ABNORMAL LOW (ref 26.0–34.0)
MCHC: 29 g/dL — ABNORMAL LOW (ref 30.0–36.0)
MCV: 89.2 fL (ref 80.0–100.0)
Platelets: 193 10*3/uL (ref 150–400)
RBC: 4.06 MIL/uL (ref 3.87–5.11)
RDW: 16.3 % — ABNORMAL HIGH (ref 11.5–15.5)
WBC: 5.6 10*3/uL (ref 4.0–10.5)
nRBC: 0 % (ref 0.0–0.2)

## 2021-03-22 ENCOUNTER — Telehealth: Payer: Self-pay

## 2021-03-22 NOTE — Telephone Encounter (Signed)
-----   Message from Liliane Shi, PA-C sent at 03/17/2021  5:18 PM EDT ----- Hgb stable.  Creatinine stable.  K+ normal.   PLAN:  -Continue current medications/treatment plan and follow up as scheduled.  Richardson Dopp, PA-C    03/17/2021 5:17 PM

## 2021-03-22 NOTE — Telephone Encounter (Signed)
Spoke to patient's son who verbalized understanding of result note. Patients son had no questions or concerns at the time and will verbalize results to patient's daughter.

## 2021-03-30 ENCOUNTER — Telehealth: Payer: Self-pay | Admitting: Cardiology

## 2021-03-30 NOTE — Telephone Encounter (Signed)
Hailey from [facility] calling to request the patient's most recent lab results. Fax [phone]

## 2021-03-30 NOTE — Telephone Encounter (Signed)
Faxed as requested

## 2021-04-23 ENCOUNTER — Ambulatory Visit: Payer: Medicare Other | Admitting: Cardiology

## 2021-04-26 ENCOUNTER — Ambulatory Visit (INDEPENDENT_AMBULATORY_CARE_PROVIDER_SITE_OTHER): Payer: Medicare Other | Admitting: Vascular Surgery

## 2021-04-26 ENCOUNTER — Other Ambulatory Visit: Payer: Self-pay

## 2021-04-26 ENCOUNTER — Encounter: Payer: Self-pay | Admitting: Vascular Surgery

## 2021-04-26 VITALS — BP 129/67 | HR 76 | Temp 97.9°F | Resp 12

## 2021-04-26 DIAGNOSIS — I739 Peripheral vascular disease, unspecified: Secondary | ICD-10-CM | POA: Diagnosis not present

## 2021-04-26 NOTE — Progress Notes (Signed)
[facility] [place]  Patient name: [name] MRN: [id number] DOB: 12/16/1935 Sex: female  REASON FOR CONSULT: Evaluation ischemia left foot  HPI: [name] is a 85 y.o. female, who is here today for evaluation of flow to her left foot.  She has severe dementia and is able to provide no history.  She has an aide from her nursing facility who has not familiar with her past history.  She reports that she has not observed her walking and she has been bed to chair.  Apparently she was recently admitted to [facility] with worsening agitation with her dementia was found to have urinary tract infection which was treated.  Her admitting creatinine was 3.6 and with hydration came back down into the mid 2 level.  The patient denies pain but does withdrawal when her foot is examined.  Past Medical History:  Diagnosis Date  . Asthma   . Bronchitis   . Carotid artery disease ([place])   . Coronary atherosclerosis of native coronary artery    Nonobstructive at cath 2005  . Dementia ([place])   . DM2 (diabetes mellitus, type 2) ([place])   . Essential hypertension   . Gout   . History of stroke   . Hypothyroidism   . Influenza 2013  . Nonischemic cardiomyopathy (HCC)    LVEF 20-25%  . PAF (paroxysmal atrial fibrillation) ([place])    a. diagnosed in 04/2018. Started on Eliquis for anticoagulation.   . Renal artery stenosis (HCC)    Bilateral    Family History  Problem Relation Age of Onset  . Asthma Son     SOCIAL HISTORY: Social History   Socioeconomic History  . Marital status: Single    Spouse name: Not on file  . Number of children: 14  . Years of education: Not on file  . Highest education level: Not on file  Occupational History  . Occupation: Retired  Tobacco Use  . Smoking status: Never Smoker  . Smokeless tobacco: Never Used  Vaping Use  . Vaping Use: Never used  Substance and Sexual Activity  . Alcohol use: No     Alcohol/week: 0.0 standard drinks  . Drug use: No  . Sexual activity: Never    Birth control/protection: Surgical  Other Topics Concern  . Not on file  Social History Narrative  . Not on file   Social Determinants of Health   Financial Resource Strain: Not on file  Food Insecurity: Not on file  Transportation Needs: Not on file  Physical Activity: Not on file  Stress: Not on file  Social Connections: Not on file  Intimate Partner Violence: Not on file    Allergies  Allergen Reactions  . Yellow Jacket Venom [Bee Venom] Shortness Of Breath and Itching    Current Outpatient Medications  Medication Sig Dispense Refill  . apixaban (ELIQUIS) 2.5 MG TABS tablet Take 1 tablet (2.5 mg total) by mouth 2 (two) times daily. 180 tablet 2  . atorvastatin (LIPITOR) 10 MG tablet Take 1 tablet (10 mg total) by mouth daily at 6 PM. 30 tablet 0  . carvedilol (COREG) 6.25 MG tablet Take 1 tablet (6.25 mg total) by mouth 2 (two) times daily with a meal. 60 tablet 1  . cholecalciferol (VITAMIN D) 25 MCG (1000 UT) tablet Take 1,000 Units by mouth 2 (two) times daily.    Marland Kitchen donepezil (ARICEPT) 10 MG tablet Take 10 mg by mouth daily.    . fluticasone (FLONASE)  50 MCG/ACT nasal spray Place 1 spray into both nostrils daily as needed for allergies or rhinitis.     Marland Kitchen gabapentin (NEURONTIN) 100 MG capsule Take 100 mg by mouth 3 (three) times daily.    . hydrALAZINE (APRESOLINE) 25 MG tablet Take 3 tablets (75 mg total) by mouth every 8 (eight) hours. 270 tablet 1  . hydrOXYzine (ATARAX/VISTARIL) 25 MG tablet Take 25 mg by mouth 2 (two) times daily.    . isosorbide mononitrate (IMDUR) 30 MG 24 hr tablet TAKE 1 TABLET BY MOUTH ONCE A DAY. 30 tablet 6  . levETIRAcetam (KEPPRA) 500 MG tablet Take 1 tablet (500 mg total) by mouth 2 (two) times daily. 60 tablet 1  . memantine (NAMENDA) 5 MG tablet Take 1 tablet (5 mg total) by mouth 2 (two) times daily. 60 tablet 1  . OXYGEN Inhale 2 L into the lungs daily as  needed (for shortness of breath).     No current facility-administered medications for this visit.    REVIEW OF SYSTEMS:  Unknown  PHYSICAL EXAM: Vitals:   04/26/21 1147  BP: 129/67  Pulse: 76  Resp: 12  Temp: 97.9 F (36.6 C)  TempSrc: Other (Comment)  SpO2: 97%    GENERAL: The patient is a well-nourished female, in no acute distress. The vital signs are documented above. CARDIOVASCULAR: No palpable pedal pulses bilaterally.  2+ radial pulses bilaterally.  She does have Doppler flow which is dampened in the posterior tibial, dorsalis pedis and peroneal.  Peroneal is the best signal PULMONARY: There is good air exchange  MUSCULOSKELETAL: There are no major deformities or cyanosis. NEUROLOGIC: No focal weakness or paresthesias are detected. SKIN: There are no ulcers or rashes noted.  She did have a dressing with some apparent irritation in the medial aspect of her great toenail bed.  No obvious infection currently and no other tissue loss PSYCHIATRIC: The patient has a normal affect.  DATA:  None  MEDICAL ISSUES: By history, the patient had cellulitis in her left foot.  This appears to have resolved.  She does have some apparent nailbed erythema but no evidence of infection.  She has severe dementia and is able unable to give any history.  I feel that this in all likelihood does have a high chance of healing.  She will be seen again in our office in 1 month.  We will obtain ankle arm indices at baseline for that.   Rosetta Posner, MD FACS [facility] [phone] Pager [phone]  Note: Portions of this report may have been transcribed using voice recognition software.  Every effort has been made to ensure accuracy; however, inadvertent computerized transcription errors may still be present.

## 2021-05-06 ENCOUNTER — Other Ambulatory Visit: Payer: Self-pay | Admitting: *Deleted

## 2021-05-06 DIAGNOSIS — I739 Peripheral vascular disease, unspecified: Secondary | ICD-10-CM

## 2021-06-16 ENCOUNTER — Ambulatory Visit: Payer: Medicare Other | Admitting: Vascular Surgery

## 2021-06-21 ENCOUNTER — Ambulatory Visit: Payer: Medicare Other | Admitting: Vascular Surgery

## 2021-07-23 ENCOUNTER — Encounter (HOSPITAL_COMMUNITY): Payer: Self-pay | Admitting: Emergency Medicine

## 2021-07-23 ENCOUNTER — Inpatient Hospital Stay (HOSPITAL_COMMUNITY)
Admission: EM | Admit: 2021-07-23 | Discharge: 2021-08-12 | DRG: 871 | Disposition: E | Payer: Medicare Other | Attending: Internal Medicine | Admitting: Internal Medicine

## 2021-07-23 ENCOUNTER — Other Ambulatory Visit: Payer: Self-pay

## 2021-07-23 DIAGNOSIS — A4159 Other Gram-negative sepsis: Principal | ICD-10-CM | POA: Diagnosis present

## 2021-07-23 DIAGNOSIS — E861 Hypovolemia: Secondary | ICD-10-CM | POA: Diagnosis present

## 2021-07-23 DIAGNOSIS — E8809 Other disorders of plasma-protein metabolism, not elsewhere classified: Secondary | ICD-10-CM | POA: Diagnosis present

## 2021-07-23 DIAGNOSIS — Z978 Presence of other specified devices: Secondary | ICD-10-CM

## 2021-07-23 DIAGNOSIS — Z91038 Other insect allergy status: Secondary | ICD-10-CM

## 2021-07-23 DIAGNOSIS — Z20822 Contact with and (suspected) exposure to covid-19: Secondary | ICD-10-CM | POA: Diagnosis present

## 2021-07-23 DIAGNOSIS — Z9981 Dependence on supplemental oxygen: Secondary | ICD-10-CM

## 2021-07-23 DIAGNOSIS — N3000 Acute cystitis without hematuria: Secondary | ICD-10-CM

## 2021-07-23 DIAGNOSIS — Z825 Family history of asthma and other chronic lower respiratory diseases: Secondary | ICD-10-CM

## 2021-07-23 DIAGNOSIS — I469 Cardiac arrest, cause unspecified: Secondary | ICD-10-CM

## 2021-07-23 DIAGNOSIS — I462 Cardiac arrest due to underlying cardiac condition: Secondary | ICD-10-CM | POA: Diagnosis not present

## 2021-07-23 DIAGNOSIS — I480 Paroxysmal atrial fibrillation: Secondary | ICD-10-CM | POA: Diagnosis present

## 2021-07-23 DIAGNOSIS — M109 Gout, unspecified: Secondary | ICD-10-CM | POA: Diagnosis present

## 2021-07-23 DIAGNOSIS — I110 Hypertensive heart disease with heart failure: Secondary | ICD-10-CM | POA: Diagnosis present

## 2021-07-23 DIAGNOSIS — R451 Restlessness and agitation: Secondary | ICD-10-CM | POA: Diagnosis not present

## 2021-07-23 DIAGNOSIS — I428 Other cardiomyopathies: Secondary | ICD-10-CM | POA: Diagnosis present

## 2021-07-23 DIAGNOSIS — Z7901 Long term (current) use of anticoagulants: Secondary | ICD-10-CM

## 2021-07-23 DIAGNOSIS — E1121 Type 2 diabetes mellitus with diabetic nephropathy: Secondary | ICD-10-CM | POA: Diagnosis present

## 2021-07-23 DIAGNOSIS — N289 Disorder of kidney and ureter, unspecified: Secondary | ICD-10-CM

## 2021-07-23 DIAGNOSIS — F0390 Unspecified dementia without behavioral disturbance: Secondary | ICD-10-CM | POA: Diagnosis present

## 2021-07-23 DIAGNOSIS — Z79899 Other long term (current) drug therapy: Secondary | ICD-10-CM

## 2021-07-23 DIAGNOSIS — E441 Mild protein-calorie malnutrition: Secondary | ICD-10-CM

## 2021-07-23 DIAGNOSIS — G9341 Metabolic encephalopathy: Secondary | ICD-10-CM | POA: Diagnosis present

## 2021-07-23 DIAGNOSIS — I5042 Chronic combined systolic (congestive) and diastolic (congestive) heart failure: Secondary | ICD-10-CM | POA: Diagnosis present

## 2021-07-23 DIAGNOSIS — Z6828 Body mass index (BMI) 28.0-28.9, adult: Secondary | ICD-10-CM

## 2021-07-23 DIAGNOSIS — E1169 Type 2 diabetes mellitus with other specified complication: Secondary | ICD-10-CM | POA: Diagnosis present

## 2021-07-23 DIAGNOSIS — N390 Urinary tract infection, site not specified: Secondary | ICD-10-CM | POA: Diagnosis present

## 2021-07-23 DIAGNOSIS — N179 Acute kidney failure, unspecified: Secondary | ICD-10-CM | POA: Diagnosis present

## 2021-07-23 DIAGNOSIS — Z91030 Bee allergy status: Secondary | ICD-10-CM

## 2021-07-23 DIAGNOSIS — E875 Hyperkalemia: Secondary | ICD-10-CM

## 2021-07-23 DIAGNOSIS — Z66 Do not resuscitate: Secondary | ICD-10-CM | POA: Diagnosis not present

## 2021-07-23 DIAGNOSIS — E785 Hyperlipidemia, unspecified: Secondary | ICD-10-CM | POA: Diagnosis present

## 2021-07-23 DIAGNOSIS — I2510 Atherosclerotic heart disease of native coronary artery without angina pectoris: Secondary | ICD-10-CM | POA: Diagnosis present

## 2021-07-23 DIAGNOSIS — R001 Bradycardia, unspecified: Secondary | ICD-10-CM | POA: Diagnosis not present

## 2021-07-23 LAB — COMPREHENSIVE METABOLIC PANEL
ALT: 59 U/L — ABNORMAL HIGH (ref 0–44)
ALT: 58 U/L — ABNORMAL HIGH (ref 0–44)
AST: 86 U/L — ABNORMAL HIGH (ref 15–41)
AST: 83 U/L — ABNORMAL HIGH (ref 15–41)
Albumin: 3.5 g/dL (ref 3.5–5.0)
Albumin: 3.4 g/dL — ABNORMAL LOW (ref 3.5–5.0)
Alkaline Phosphatase: 208 U/L — ABNORMAL HIGH (ref 38–126)
Alkaline Phosphatase: 203 U/L — ABNORMAL HIGH (ref 38–126)
Anion gap: <3 — ABNORMAL LOW (ref 5–15)
Anion gap: 4 — ABNORMAL LOW (ref 5–15)
BUN: 79 mg/dL — ABNORMAL HIGH (ref 8–23)
BUN: 79 mg/dL — ABNORMAL HIGH (ref 8–23)
CO2: 16 mmol/L — ABNORMAL LOW (ref 22–32)
CO2: 16 mmol/L — ABNORMAL LOW (ref 22–32)
Calcium: 9.2 mg/dL (ref 8.9–10.3)
Calcium: 9.2 mg/dL (ref 8.9–10.3)
Chloride: 127 mmol/L — ABNORMAL HIGH (ref 98–111)
Chloride: 125 mmol/L — ABNORMAL HIGH (ref 98–111)
Creatinine, Ser: 3.69 mg/dL — ABNORMAL HIGH (ref 0.44–1.00)
Creatinine, Ser: 3.7 mg/dL — ABNORMAL HIGH (ref 0.44–1.00)
GFR, Estimated: 12 mL/min — ABNORMAL LOW (ref >60)
GFR, Estimated: 12 mL/min — ABNORMAL LOW (ref >60)
Glucose, Bld: 106 mg/dL — ABNORMAL HIGH (ref 70–99)
Glucose, Bld: 105 mg/dL — ABNORMAL HIGH (ref 70–99)
Potassium: >7.5 mmol/L (ref 3.5–5.1)
Potassium: 7.6 mmol/L (ref 3.5–5.1)
Sodium: 145 mmol/L (ref 135–145)
Sodium: 145 mmol/L (ref 135–145)
Total Bilirubin: 0.3 mg/dL (ref 0.3–1.2)
Total Bilirubin: 0.2 mg/dL — ABNORMAL LOW (ref 0.3–1.2)
Total Protein: 6.9 g/dL (ref 6.5–8.1)
Total Protein: 6.7 g/dL (ref 6.5–8.1)

## 2021-07-23 LAB — URINALYSIS, ROUTINE W REFLEX MICROSCOPIC
Bilirubin Urine: NEGATIVE
Glucose, UA: NEGATIVE mg/dL
Ketones, ur: NEGATIVE mg/dL
Nitrite: POSITIVE — AB
Protein, ur: 30 mg/dL — AB
Specific Gravity, Urine: 1.014 (ref 1.005–1.030)
pH: 6 (ref 5.0–8.0)

## 2021-07-23 LAB — CBC WITH DIFFERENTIAL/PLATELET
Abs Immature Granulocytes: 0.03 10*3/uL (ref 0.00–0.07)
Basophils Absolute: 0 10*3/uL (ref 0.0–0.1)
Basophils Relative: 0 %
Eosinophils Absolute: 0 10*3/uL (ref 0.0–0.5)
Eosinophils Relative: 1 %
HCT: 35.7 % — ABNORMAL LOW (ref 36.0–46.0)
Hemoglobin: 10.4 g/dL — ABNORMAL LOW (ref 12.0–15.0)
Immature Granulocytes: 1 %
Lymphocytes Relative: 24 %
Lymphs Abs: 0.8 10*3/uL (ref 0.7–4.0)
MCH: 26.7 pg (ref 26.0–34.0)
MCHC: 29.1 g/dL — ABNORMAL LOW (ref 30.0–36.0)
MCV: 91.5 fL (ref 80.0–100.0)
Monocytes Absolute: 0.1 10*3/uL (ref 0.1–1.0)
Monocytes Relative: 4 %
Neutro Abs: 2.2 10*3/uL (ref 1.7–7.7)
Neutrophils Relative %: 70 %
Platelets: 90 10*3/uL — ABNORMAL LOW (ref 150–400)
RBC: 3.9 MIL/uL (ref 3.87–5.11)
RDW: 17.7 % — ABNORMAL HIGH (ref 11.5–15.5)
WBC: 3.2 10*3/uL — ABNORMAL LOW (ref 4.0–10.5)
nRBC: 1 % — ABNORMAL HIGH (ref 0.0–0.2)

## 2021-07-23 MED ORDER — ZIPRASIDONE MESYLATE 20 MG IM SOLR
INTRAMUSCULAR | Status: AC
  Administered 2021-07-23: 10 mg via INTRAMUSCULAR
  Filled 2021-07-23: qty 20

## 2021-07-23 MED ORDER — SODIUM BICARBONATE 8.4 % IV SOLN: 50.0000 meq | Freq: Once | INTRAVENOUS | Status: AC

## 2021-07-23 MED ORDER — SODIUM ZIRCONIUM CYCLOSILICATE 5 G PO PACK
10.0000 g | PACK | ORAL | Status: DC
  Filled 2021-07-23: qty 2

## 2021-07-23 MED ORDER — SODIUM CHLORIDE 0.9 % IV SOLN
1.0000 g | Freq: Once | INTRAVENOUS | Status: AC
  Administered 2021-07-24: 1 g via INTRAVENOUS
  Filled 2021-07-23: qty 10

## 2021-07-23 MED ORDER — INSULIN ASPART 100 UNIT/ML IV SOLN
10.0000 [IU] | Freq: Once | INTRAVENOUS | Status: AC
  Administered 2021-07-24: 10 [IU] via INTRAVENOUS

## 2021-07-23 MED ORDER — CALCIUM GLUCONATE-NACL 1-0.675 GM/50ML-% IV SOLN
1.0000 g | Freq: Once | INTRAVENOUS | Status: AC
  Administered 2021-07-24: 1000 mg via INTRAVENOUS
  Filled 2021-07-23: qty 50

## 2021-07-23 MED ORDER — DEXTROSE 50 % IV SOLN
1.0000 | Freq: Once | INTRAVENOUS | Status: AC
  Administered 2021-07-24: 50 mL via INTRAVENOUS
  Filled 2021-07-23: qty 50

## 2021-07-23 MED ORDER — SODIUM CHLORIDE 0.9 % IV SOLN: INTRAVENOUS | Status: DC

## 2021-07-23 MED ORDER — QUETIAPINE FUMARATE 25 MG PO TABS
50.0000 mg | ORAL_TABLET | Freq: Every day | ORAL | Status: DC
  Administered 2021-07-23: 50 mg via ORAL
  Filled 2021-07-23: qty 2

## 2021-07-23 MED ORDER — ZIPRASIDONE MESYLATE 20 MG IM SOLR: 10.0000 mg | Freq: Once | INTRAMUSCULAR | Status: AC

## 2021-07-23 NOTE — ED Notes (Signed)
Lab called to redraw pt cmp STAT.

## 2021-07-23 NOTE — ED Notes (Signed)
Date and time results received: 07/22/2021 10:08 PM  Test: Potassium  Critical Value: >7.5   Name of Provider Notified: Dr. Sabra Heck   Orders Received? Or Actions Taken?:

## 2021-07-23 NOTE — ED Triage Notes (Signed)
Pt brought in by [facility] from [facility] [place] for agitation. Staff states pt is on dementia unit and became combative with other residents and staff. Sent here for evaluation.

## 2021-07-23 NOTE — ED Provider Notes (Addendum)
[facility] EMERGENCY DEPARTMENT Provider Note   CSN: [id number] Arrival date & time: 07/17/2021  2005     History Chief Complaint  Patient presents with   Agitation    [name] is a 85 y.o. female.  HPI  This patient is an 85 year old female with a history of dementia, level 5 caveat applies as the patient is not able to give me any valuable information.  She has a known ejection fraction of 20 to 25% because of a nonischemic cardiomyopathy, she has paroxysmal atrial fibrillation, she is on Eliquis.  Also has hx of hypertension, diabetes and unfortunately is currently living in a nursing facility where she has had some increasing agitation tonight.  She was sent here to the hospital today because of some increasing agitation, the patient does not give any other information, there is no reports of fevers vomiting coughing or any difficulty breathing.  Past Medical History:  Diagnosis Date   Asthma    Bronchitis    Carotid artery disease ([place])    Coronary atherosclerosis of native coronary artery    Nonobstructive at cath 2005   Dementia [facility])    DM2 (diabetes mellitus, type 2) ([place])    Essential hypertension    Gout    History of stroke    Hypothyroidism    Influenza 2013   Nonischemic cardiomyopathy ([place])    LVEF 20-25%   PAF (paroxysmal atrial fibrillation) ([place])    a. diagnosed in 04/2018. Started on Eliquis for anticoagulation.    Renal artery stenosis [facility])    Bilateral    Patient Active Problem List   Diagnosis Date Noted   Goals of care, counseling/discussion    Palliative care by specialist    Left-sided weakness 03/10/2020   Aspiration pneumonia of lower lobe ([place])    Urinary tract infection without hematuria    Acute metabolic encephalopathy 0000000   Syncope and collapse 05/17/2018   Chronic combined systolic and diastolic CHF (congestive heart failure) ([place]) 05/16/2018   Acute renal failure superimposed on stage 3 chronic kidney disease ([place])  05/16/2018   Atrial fibrillation, chronic ([place]) 05/14/2018   Atrial fibrillation with RVR ([place]) 04/24/2018   Hypokalemia 04/24/2018   Acute exacerbation of CHF (congestive heart failure) ([place]) 09/01/2015   CHF (congestive heart failure) ([place]) 07/02/2015   Elevated troponin I level 07/02/2015   Right bundle branch block 07/02/2015   Renal insufficiency 07/02/2015   CAP (community acquired pneumonia) 04/25/2015   Acute respiratory failure with hypoxia ([place]) 04/25/2015   Acute renal injury ([place]) 04/25/2015   Type 2 diabetes with nephropathy ([place]) 04/25/2015   ARF (acute renal failure) ([place]) 06/18/2014   Mitral regurgitation 06/09/2014   Dyspnea 06/06/2014   Asthma exacerbation 06/06/2014   Acute on chronic combined systolic and diastolic CHF (congestive heart failure) ([place]) 06/06/2014   Influenza 12/10/2012   Type 2 diabetes mellitus with hyperlipidemia ([place]) 07/08/2010   Essential hypertension 04/06/2010   CORONARY ATHEROSCLEROSIS NATIVE CORONARY ARTERY 04/06/2010   CARDIOMYOPATHY 03/29/2010   CVA (cerebral vascular accident) ([place]) 03/29/2010    Past Surgical History:  Procedure Laterality Date   ABDOMINAL HYSTERECTOMY     CATARACT EXTRACTION W/PHACO Left 01/18/2019   Procedure: CATARACT EXTRACTION PHACO AND INTRAOCULAR LENS PLACEMENT ([place]);  Surgeon: Baruch Goldmann, MD;  Location: [facility];  Service: Ophthalmology;  Laterality: Left;  CDE: 11.99   CATARACT EXTRACTION W/PHACO Right 02/26/2019   Procedure: CATARACT EXTRACTION PHACO AND INTRAOCULAR LENS PLACEMENT RIGHT EYE (CDE: 13.46);  Surgeon: Baruch Goldmann, MD;  Location: [facility];  Service: Ophthalmology;  Laterality: Right;     OB History     Gravida  15   Para  15   Term  15   Preterm      AB      Living         SAB      IAB      Ectopic      Multiple      Live Births              Family History  Problem Relation Age of Onset   Asthma Son     Social History   Tobacco Use   Smoking status: Never    Smokeless tobacco: Never  Vaping Use   Vaping Use: Never used  Substance Use Topics   Alcohol use: No    Alcohol/week: 0.0 standard drinks   Drug use: No    Home Medications Prior to Admission medications   Medication Sig Start Date End Date Taking? Authorizing Provider  apixaban (ELIQUIS) 2.5 MG TABS tablet Take 1 tablet (2.5 mg total) by mouth 2 (two) times daily. 02/10/21   Richardson Dopp T, PA-C  atorvastatin (LIPITOR) 10 MG tablet Take 1 tablet (10 mg total) by mouth daily at 6 PM. 03/17/20   Johnson, Clanford L, MD  carvedilol (COREG) 6.25 MG tablet Take 1 tablet (6.25 mg total) by mouth 2 (two) times daily with a meal. 03/17/20   Johnson, Clanford L, MD  cholecalciferol (VITAMIN D) 25 MCG (1000 UT) tablet Take 1,000 Units by mouth 2 (two) times daily.    [provider]  donepezil (ARICEPT) 10 MG tablet Take 10 mg by mouth daily.    [provider]  fluticasone (FLONASE) 50 MCG/ACT nasal spray Place 1 spray into both nostrils daily as needed for allergies or rhinitis.  01/03/19   [provider]  gabapentin (NEURONTIN) 100 MG capsule Take 100 mg by mouth 3 (three) times daily. 04/26/21   [provider]  hydrALAZINE (APRESOLINE) 25 MG tablet Take 3 tablets (75 mg total) by mouth every 8 (eight) hours. 03/17/20   Johnson, Clanford L, MD  hydrOXYzine (ATARAX/VISTARIL) 25 MG tablet Take 25 mg by mouth 2 (two) times daily. 03/31/21   [provider]  isosorbide mononitrate (IMDUR) 30 MG 24 hr tablet TAKE 1 TABLET BY MOUTH ONCE A DAY. 10/08/20   Satira Sark, MD  levETIRAcetam (KEPPRA) 500 MG tablet Take 1 tablet (500 mg total) by mouth 2 (two) times daily. 01/01/20   Barton Dubois, MD  memantine (NAMENDA) 5 MG tablet Take 1 tablet (5 mg total) by mouth 2 (two) times daily. 03/17/20   Johnson, Clanford L, MD  OXYGEN Inhale 2 L into the lungs daily as needed (for shortness of breath).    [provider]    Allergies    Yellow jacket venom  [bee venom]  Review of Systems   Review of Systems  Unable to perform ROS: Dementia   Physical Exam Updated Vital Signs BP 132/74 (BP Location: Left Arm)   Pulse (!) 50   Temp 97.7 F (36.5 C) (Oral)   Resp 16   Ht 1.6 m ('5\' 3"'$ )   Wt 73 kg   SpO2 98%   BMI 28.51 kg/m   Physical Exam Vitals and nursing note reviewed.  Constitutional:      General: She is not in acute distress.    Appearance: She is well-developed.  HENT:  Head: Normocephalic and atraumatic.     Mouth/Throat:     Pharynx: No oropharyngeal exudate.  Eyes:     General: No scleral icterus.       Right eye: No discharge.        Left eye: No discharge.     Conjunctiva/sclera: Conjunctivae normal.     Pupils: Pupils are equal, round, and reactive to light.  Neck:     Thyroid: No thyromegaly.     Vascular: No JVD.  Cardiovascular:     Rate and Rhythm: Normal rate and regular rhythm.     Heart sounds: Normal heart sounds. No murmur heard.   No friction rub. No gallop.  Pulmonary:     Effort: Pulmonary effort is normal. No respiratory distress.     Breath sounds: Normal breath sounds. No wheezing or rales.  Abdominal:     General: Bowel sounds are normal. There is no distension.     Palpations: Abdomen is soft. There is no mass.     Tenderness: There is no abdominal tenderness.  Musculoskeletal:        General: No tenderness. Normal range of motion.     Cervical back: Normal range of motion and neck supple.  Lymphadenopathy:     Cervical: No cervical adenopathy.  Skin:    General: Skin is warm and dry.     Findings: No erythema or rash.  Neurological:     Mental Status: She is alert.     Coordination: Coordination normal.     Comments: This patient is able to answer my questions, she is able to follow commands with both of her arms, she can lift both of her legs, she is appropriate for her cognitive baseline and is not agitated whatsoever, she is not combative, she follows commands and is very  redirectable.  Psychiatric:        Behavior: Behavior normal.    ED Results / Procedures / Treatments   Labs (all labs ordered are listed, but only abnormal results are displayed) Labs Reviewed  CBC WITH DIFFERENTIAL/PLATELET - Abnormal; Notable for the following components:      Result Value   WBC 3.2 (*)    Hemoglobin 10.4 (*)    HCT 35.7 (*)    MCHC 29.1 (*)    RDW 17.7 (*)    Platelets 90 (*)    nRBC 1.0 (*)    All other components within normal limits  COMPREHENSIVE METABOLIC PANEL - Abnormal; Notable for the following components:   Potassium >7.5 (*)    Chloride 127 (*)    CO2 16 (*)    Glucose, Bld 106 (*)    BUN 79 (*)    Creatinine, Ser 3.69 (*)    AST 86 (*)    ALT 59 (*)    Alkaline Phosphatase 208 (*)    GFR, Estimated 12 (*)    Anion gap <3 (*)    All other components within normal limits  URINALYSIS, ROUTINE W REFLEX MICROSCOPIC - Abnormal; Notable for the following components:   APPearance HAZY (*)    Hgb urine dipstick SMALL (*)    Protein, ur 30 (*)    Nitrite POSITIVE (*)    Leukocytes,Ua LARGE (*)    Bacteria, UA RARE (*)    All other components within normal limits  COMPREHENSIVE METABOLIC PANEL - Abnormal; Notable for the following components:   Potassium 7.6 (*)    Chloride 125 (*)    CO2 16 (*)  Glucose, Bld 105 (*)    BUN 79 (*)    Creatinine, Ser 3.70 (*)    Albumin 3.4 (*)    AST 83 (*)    ALT 58 (*)    Alkaline Phosphatase 203 (*)    Total Bilirubin 0.2 (*)    GFR, Estimated 12 (*)    Anion gap 4 (*)    All other components within normal limits  CBG MONITORING, ED    EKG EKG Interpretation  Date/Time:  Friday July 23 2021 21:27:39 EDT Ventricular Rate:  147 PR Interval:    QRS Duration: 212 QT Interval:  263 QTC Calculation: 369 R Axis:   -72 Text Interpretation: mild sinus bradycardia with RBBB Nonspecific T wave abnormality since last tracing no significant change Confirmed by Noemi Chapel [phone]) on 08/03/2021  9:43:52 PM  Radiology No results found.  Procedures .Critical Care  Date/Time: 07/18/2021 11:57 PM Performed by: Noemi Chapel, MD Authorized by: Noemi Chapel, MD   Critical care provider statement:    Critical care time (minutes):  35   Critical care time was exclusive of:  Separately billable procedures and treating other patients and teaching time   Critical care was necessary to treat or prevent imminent or life-threatening deterioration of the following conditions:  Endocrine crisis and renal failure   Critical care was time spent personally by me on the following activities:  Blood draw for specimens, development of treatment plan with patient or surrogate, discussions with consultants, evaluation of patient's response to treatment, examination of patient, obtaining history from patient or surrogate, ordering and performing treatments and interventions, ordering and review of laboratory studies, ordering and review of radiographic studies, pulse oximetry, re-evaluation of patient's condition and review of old charts   Medications Ordered in ED Medications  QUEtiapine (SEROQUEL) tablet 50 mg (50 mg Oral Given 08/10/2021 2224)  0.9 %  sodium chloride infusion (has no administration in time range)  cefTRIAXone (ROCEPHIN) 1 g in sodium chloride 0.9 % 100 mL IVPB (has no administration in time range)  calcium gluconate 1 g/ 50 mL sodium chloride IVPB (has no administration in time range)  sodium bicarbonate injection 50 mEq (has no administration in time range)  insulin aspart (novoLOG) injection 10 Units (has no administration in time range)  dextrose 50 % solution 50 mL (has no administration in time range)  sodium zirconium cyclosilicate (LOKELMA) packet 10 g (has no administration in time range)  ziprasidone (GEODON) injection 10 mg (10 mg Intramuscular Given 08/02/2021 2239)    ED Course  I have reviewed the triage vital signs and the nursing notes.  Pertinent labs & imaging results  that were available during my care of the patient were reviewed by me and considered in my medical decision making (see chart for details).    MDM Rules/Calculators/A&P                           This patient does need a metabolic work-up for any potential abnormalities including urinary tract infection, electrolytes etc.  She does not have any findings to suggest that she is acutely psychotic or in need of acute psychiatric care.  With her chronic dementia I suspect that she has some intermittent agitation and will over time, this does not necessitate a psychiatric work-up or placement.  A catheter sample for urine was obtained and shows likely urinary tract infection.  The patient's creatinine is slightly elevated from baseline but not significantly.  IV  fluids have been ordered, at change of shift care signed out to Dr. Stark Jock to follow-up results of repeat metabolic panel to make sure the patient is not severely hyperkalemic which I suspect was just hemolysis during lab draw.  The initial potassium was elevated, this was redrawn and maintained that this was still significantly elevated to the point where the patient needs acute treatment.  The patient will be treated with medication including Lokelma, calcium, insulin, D50 and bicarbonate.  At change of shift care signed out to oncoming physician to follow-up improvement and disposition, critically ill  Final Clinical Impression(s) / ED Diagnoses Final diagnoses:  Renal insufficiency  Acute cystitis without hematuria  Agitation  Hyperkalemia    Rx / DC Orders ED Discharge Orders     None        Noemi Chapel, MD 08/01/2021 2337    Noemi Chapel, MD 07/26/2021 2359

## 2021-07-23 NOTE — ED Notes (Signed)
Date and time results received: 08/05/2021 2350  Test: Potassium Critical Value: 7.5  Name of Provider Notified: [place]

## 2021-07-24 ENCOUNTER — Inpatient Hospital Stay (HOSPITAL_COMMUNITY): Payer: Medicare Other

## 2021-07-24 DIAGNOSIS — N179 Acute kidney failure, unspecified: Secondary | ICD-10-CM

## 2021-07-24 DIAGNOSIS — E441 Mild protein-calorie malnutrition: Secondary | ICD-10-CM | POA: Diagnosis present

## 2021-07-24 DIAGNOSIS — G9341 Metabolic encephalopathy: Secondary | ICD-10-CM

## 2021-07-24 DIAGNOSIS — A4159 Other Gram-negative sepsis: Secondary | ICD-10-CM | POA: Diagnosis present

## 2021-07-24 DIAGNOSIS — I462 Cardiac arrest due to underlying cardiac condition: Secondary | ICD-10-CM | POA: Diagnosis not present

## 2021-07-24 DIAGNOSIS — I2510 Atherosclerotic heart disease of native coronary artery without angina pectoris: Secondary | ICD-10-CM | POA: Diagnosis present

## 2021-07-24 DIAGNOSIS — I469 Cardiac arrest, cause unspecified: Secondary | ICD-10-CM | POA: Diagnosis not present

## 2021-07-24 DIAGNOSIS — Z79899 Other long term (current) drug therapy: Secondary | ICD-10-CM | POA: Diagnosis not present

## 2021-07-24 DIAGNOSIS — E861 Hypovolemia: Secondary | ICD-10-CM | POA: Diagnosis present

## 2021-07-24 DIAGNOSIS — N3000 Acute cystitis without hematuria: Secondary | ICD-10-CM

## 2021-07-24 DIAGNOSIS — E875 Hyperkalemia: Secondary | ICD-10-CM

## 2021-07-24 DIAGNOSIS — Z9981 Dependence on supplemental oxygen: Secondary | ICD-10-CM | POA: Diagnosis not present

## 2021-07-24 DIAGNOSIS — E785 Hyperlipidemia, unspecified: Secondary | ICD-10-CM | POA: Diagnosis present

## 2021-07-24 DIAGNOSIS — Z66 Do not resuscitate: Secondary | ICD-10-CM | POA: Diagnosis not present

## 2021-07-24 DIAGNOSIS — N390 Urinary tract infection, site not specified: Secondary | ICD-10-CM | POA: Diagnosis present

## 2021-07-24 DIAGNOSIS — I480 Paroxysmal atrial fibrillation: Secondary | ICD-10-CM | POA: Diagnosis present

## 2021-07-24 DIAGNOSIS — I428 Other cardiomyopathies: Secondary | ICD-10-CM | POA: Diagnosis present

## 2021-07-24 DIAGNOSIS — I110 Hypertensive heart disease with heart failure: Secondary | ICD-10-CM | POA: Diagnosis present

## 2021-07-24 DIAGNOSIS — E1169 Type 2 diabetes mellitus with other specified complication: Secondary | ICD-10-CM | POA: Diagnosis present

## 2021-07-24 DIAGNOSIS — I5042 Chronic combined systolic (congestive) and diastolic (congestive) heart failure: Secondary | ICD-10-CM | POA: Diagnosis present

## 2021-07-24 DIAGNOSIS — Z825 Family history of asthma and other chronic lower respiratory diseases: Secondary | ICD-10-CM | POA: Diagnosis not present

## 2021-07-24 DIAGNOSIS — E1121 Type 2 diabetes mellitus with diabetic nephropathy: Secondary | ICD-10-CM | POA: Diagnosis present

## 2021-07-24 DIAGNOSIS — E8809 Other disorders of plasma-protein metabolism, not elsewhere classified: Secondary | ICD-10-CM | POA: Diagnosis present

## 2021-07-24 DIAGNOSIS — R451 Restlessness and agitation: Secondary | ICD-10-CM | POA: Diagnosis present

## 2021-07-24 DIAGNOSIS — Z91030 Bee allergy status: Secondary | ICD-10-CM | POA: Diagnosis not present

## 2021-07-24 DIAGNOSIS — F0390 Unspecified dementia without behavioral disturbance: Secondary | ICD-10-CM | POA: Diagnosis present

## 2021-07-24 DIAGNOSIS — Z7901 Long term (current) use of anticoagulants: Secondary | ICD-10-CM | POA: Diagnosis not present

## 2021-07-24 DIAGNOSIS — Z20822 Contact with and (suspected) exposure to covid-19: Secondary | ICD-10-CM | POA: Diagnosis present

## 2021-07-24 LAB — BASIC METABOLIC PANEL
Anion gap: 8 (ref 5–15)
BUN: 80 mg/dL — ABNORMAL HIGH (ref 8–23)
CO2: 15 mmol/L — ABNORMAL LOW (ref 22–32)
Calcium: 9.9 mg/dL (ref 8.9–10.3)
Chloride: 126 mmol/L — ABNORMAL HIGH (ref 98–111)
Creatinine, Ser: 3.65 mg/dL — ABNORMAL HIGH (ref 0.44–1.00)
GFR, Estimated: 12 mL/min — ABNORMAL LOW (ref >60)
Glucose, Bld: 147 mg/dL — ABNORMAL HIGH (ref 70–99)
Potassium: 5.5 mmol/L — ABNORMAL HIGH (ref 3.5–5.1)
Sodium: 149 mmol/L — ABNORMAL HIGH (ref 135–145)

## 2021-07-24 LAB — CBC WITH DIFFERENTIAL/PLATELET
Abs Immature Granulocytes: 0.1 10*3/uL — ABNORMAL HIGH (ref 0.00–0.07)
Basophils Absolute: 0 10*3/uL (ref 0.0–0.1)
Basophils Relative: 0 %
Eosinophils Absolute: 0 10*3/uL (ref 0.0–0.5)
Eosinophils Relative: 0 %
HCT: 34.5 % — ABNORMAL LOW (ref 36.0–46.0)
Hemoglobin: 10.4 g/dL — ABNORMAL LOW (ref 12.0–15.0)
Immature Granulocytes: 1 %
Lymphocytes Relative: 27 %
Lymphs Abs: 1.9 10*3/uL (ref 0.7–4.0)
MCH: 26.8 pg (ref 26.0–34.0)
MCHC: 30.1 g/dL (ref 30.0–36.0)
MCV: 88.9 fL (ref 80.0–100.0)
Monocytes Absolute: 0.1 10*3/uL (ref 0.1–1.0)
Monocytes Relative: 1 %
Neutro Abs: 5 10*3/uL (ref 1.7–7.7)
Neutrophils Relative %: 71 %
Platelets: 80 10*3/uL — ABNORMAL LOW (ref 150–400)
RBC: 3.88 MIL/uL (ref 3.87–5.11)
RDW: 17.3 % — ABNORMAL HIGH (ref 11.5–15.5)
WBC: 7.2 10*3/uL (ref 4.0–10.5)
nRBC: 1.5 % — ABNORMAL HIGH (ref 0.0–0.2)

## 2021-07-24 LAB — COMPREHENSIVE METABOLIC PANEL
ALT: 78 U/L — ABNORMAL HIGH (ref 0–44)
AST: 157 U/L — ABNORMAL HIGH (ref 15–41)
Albumin: 2.6 g/dL — ABNORMAL LOW (ref 3.5–5.0)
Alkaline Phosphatase: 223 U/L — ABNORMAL HIGH (ref 38–126)
Anion gap: 6 (ref 5–15)
BUN: 79 mg/dL — ABNORMAL HIGH (ref 8–23)
CO2: 20 mmol/L — ABNORMAL LOW (ref 22–32)
Calcium: 9.9 mg/dL (ref 8.9–10.3)
Chloride: 127 mmol/L — ABNORMAL HIGH (ref 98–111)
Creatinine, Ser: 3.6 mg/dL — ABNORMAL HIGH (ref 0.44–1.00)
GFR, Estimated: 12 mL/min — ABNORMAL LOW (ref >60)
Glucose, Bld: 184 mg/dL — ABNORMAL HIGH (ref 70–99)
Potassium: 4.6 mmol/L (ref 3.5–5.1)
Sodium: 153 mmol/L — ABNORMAL HIGH (ref 135–145)
Total Bilirubin: 0.4 mg/dL (ref 0.3–1.2)
Total Protein: 5.8 g/dL — ABNORMAL LOW (ref 6.5–8.1)

## 2021-07-24 LAB — PROTIME-INR
INR: 1.5 — ABNORMAL HIGH (ref 0.8–1.2)
Prothrombin Time: 18.1 seconds — ABNORMAL HIGH (ref 11.4–15.2)

## 2021-07-24 LAB — LACTIC ACID, PLASMA
Lactic Acid, Venous: 3.5 mmol/L (ref 0.5–1.9)
Lactic Acid, Venous: 4.9 mmol/L (ref 0.5–1.9)

## 2021-07-24 LAB — BLOOD GAS, ARTERIAL
Acid-base deficit: 9.1 mmol/L — ABNORMAL HIGH (ref 0.0–2.0)
Acid-base deficit: 7 mmol/L — ABNORMAL HIGH (ref 0.0–2.0)
Bicarbonate: 16.3 mmol/L — ABNORMAL LOW (ref 20.0–28.0)
Bicarbonate: 18.3 mmol/L — ABNORMAL LOW (ref 20.0–28.0)
FIO2: 100
FIO2: 100
O2 Saturation: 99.6 %
O2 Saturation: 83.7 %
Patient temperature: 37
Patient temperature: 29.6
pCO2 arterial: 57.7 mmHg — ABNORMAL HIGH (ref 32.0–48.0)
pCO2 arterial: 28.4 mmHg — ABNORMAL LOW (ref 32.0–48.0)
pH, Arterial: 7.134 — CL (ref 7.350–7.450)
pH, Arterial: 7.384 (ref 7.350–7.450)
pO2, Arterial: 351 mmHg — ABNORMAL HIGH (ref 83.0–108.0)
pO2, Arterial: 29.1 mmHg — CL (ref 83.0–108.0)

## 2021-07-24 LAB — PROCALCITONIN: Procalcitonin: <0.1 ng/mL

## 2021-07-24 LAB — BLOOD GAS, VENOUS
Acid-base deficit: 12.2 mmol/L — ABNORMAL HIGH (ref 0.0–2.0)
Bicarbonate: 13.5 mmol/L — ABNORMAL LOW (ref 20.0–28.0)
FIO2: 21
O2 Saturation: 57.9 %
Patient temperature: 36.5
pCO2, Ven: 61.9 mmHg — ABNORMAL HIGH (ref 44.0–60.0)
pH, Ven: 7.055 — CL (ref 7.250–7.430)
pO2, Ven: 36.8 mmHg (ref 32.0–45.0)

## 2021-07-24 LAB — CBG MONITORING, ED
Glucose-Capillary: 100 mg/dL — ABNORMAL HIGH (ref 70–99)
Glucose-Capillary: 198 mg/dL — ABNORMAL HIGH (ref 70–99)

## 2021-07-24 LAB — MAGNESIUM: Magnesium: 2.4 mg/dL (ref 1.7–2.4)

## 2021-07-24 LAB — RESP PANEL BY RT-PCR (FLU A&B, COVID) ARPGX2
Influenza A by PCR: NEGATIVE
Influenza B by PCR: NEGATIVE
SARS Coronavirus 2 by RT PCR: NEGATIVE

## 2021-07-24 LAB — PHOSPHORUS: Phosphorus: 3.9 mg/dL (ref 2.5–4.6)

## 2021-07-24 LAB — CORTISOL-AM, BLOOD: Cortisol - AM: 17.1 ug/dL (ref 6.7–22.6)

## 2021-07-24 MED ORDER — SODIUM POLYSTYRENE SULFONATE 15 GM/60ML PO SUSP: 30.0000 g | Freq: Once | ORAL | Status: DC

## 2021-07-24 MED ORDER — SODIUM CHLORIDE 0.9 % IV SOLN: 2.0000 g | INTRAVENOUS | Status: DC

## 2021-07-24 MED ORDER — SODIUM CHLORIDE 0.9 % IV BOLUS
1000.0000 mL | Freq: Once | INTRAVENOUS | Status: AC
  Administered 2021-07-24: 1000 mL via INTRAVENOUS

## 2021-07-24 MED ORDER — MIDAZOLAM HCL 2 MG/2ML IJ SOLN
INTRAMUSCULAR | Status: AC
  Administered 2021-07-24: 2 mg via INTRAVENOUS
  Filled 2021-07-24: qty 2

## 2021-07-24 MED ORDER — ALBUTEROL SULFATE (2.5 MG/3ML) 0.083% IN NEBU
INHALATION_SOLUTION | RESPIRATORY_TRACT | Status: AC
  Administered 2021-07-24: 5 mg
  Filled 2021-07-24: qty 6

## 2021-07-24 MED ORDER — ONDANSETRON HCL 4 MG PO TABS: 4.0000 mg | ORAL_TABLET | Freq: Four times a day (QID) | ORAL | Status: DC | PRN

## 2021-07-24 MED ORDER — CALCIUM CHLORIDE 10 % IV SOLN
INTRAVENOUS | Status: AC | PRN
  Administered 2021-07-24: 1 g via INTRAVENOUS

## 2021-07-24 MED ORDER — ALBUTEROL SULFATE (2.5 MG/3ML) 0.083% IN NEBU
INHALATION_SOLUTION | RESPIRATORY_TRACT | Status: AC
  Administered 2021-07-24: 7.5 mg
  Filled 2021-07-24: qty 9

## 2021-07-24 MED ORDER — INSULIN ASPART 100 UNIT/ML IJ SOLN: 0.0000 [IU] | Freq: Three times a day (TID) | INTRAMUSCULAR | Status: DC

## 2021-07-24 MED ORDER — LEVETIRACETAM IN NACL 500 MG/100ML IV SOLN
500.0000 mg | Freq: Two times a day (BID) | INTRAVENOUS | Status: DC
  Administered 2021-07-24: 500 mg via INTRAVENOUS
  Filled 2021-07-24: qty 100

## 2021-07-24 MED ORDER — HYDROCORTISONE NA SUCCINATE PF 100 MG IJ SOLR
100.0000 mg | Freq: Two times a day (BID) | INTRAMUSCULAR | Status: DC
  Administered 2021-07-24: 100 mg via INTRAVENOUS
  Filled 2021-07-24: qty 2

## 2021-07-24 MED ORDER — LORAZEPAM 2 MG/ML IJ SOLN
1.0000 mg | Freq: Once | INTRAMUSCULAR | Status: AC
  Administered 2021-07-24: 1 mg via INTRAVENOUS
  Filled 2021-07-24: qty 1

## 2021-07-24 MED ORDER — HEPARIN SODIUM (PORCINE) 5000 UNIT/ML IJ SOLN
5000.0000 [IU] | Freq: Three times a day (TID) | INTRAMUSCULAR | Status: DC
  Administered 2021-07-24: 5000 [IU] via SUBCUTANEOUS
  Filled 2021-07-24: qty 1

## 2021-07-24 MED ORDER — SODIUM BICARBONATE 8.4 % IV SOLN
INTRAVENOUS | Status: AC | PRN
Start: 2021-07-24 — End: 2021-07-24
  Administered 2021-07-24: 50 meq via INTRAVENOUS

## 2021-07-24 MED ORDER — MIDAZOLAM HCL 2 MG/2ML IJ SOLN: 2.0000 mg | Freq: Once | INTRAMUSCULAR | Status: AC

## 2021-07-24 MED ORDER — SODIUM BICARBONATE 8.4 % IV SOLN
INTRAVENOUS | Status: AC
  Administered 2021-07-24: 50 meq via INTRAVENOUS
  Filled 2021-07-24: qty 50

## 2021-07-24 MED ORDER — EPINEPHRINE 1 MG/10ML IJ SOSY
PREFILLED_SYRINGE | INTRAMUSCULAR | Status: AC | PRN
  Administered 2021-07-24: 1 mg via INTRAVENOUS

## 2021-07-24 MED ORDER — FENTANYL CITRATE PF 50 MCG/ML IJ SOSY: 100.0000 ug | PREFILLED_SYRINGE | Freq: Once | INTRAMUSCULAR | Status: AC

## 2021-07-24 MED ORDER — SODIUM BICARBONATE 8.4 % IV SOLN
50.0000 meq | Freq: Once | INTRAVENOUS | Status: AC
  Administered 2021-07-24: 50 meq via INTRAVENOUS

## 2021-07-24 MED ORDER — EPINEPHRINE 1 MG/10ML IJ SOSY
PREFILLED_SYRINGE | INTRAMUSCULAR | Status: AC | PRN
Start: 2021-07-24 — End: 2021-07-24
  Administered 2021-07-24: 1 mg via INTRAVENOUS

## 2021-07-24 MED ORDER — SODIUM BICARBONATE 8.4 % IV SOLN
INTRAVENOUS | Status: DC
  Filled 2021-07-24 (×6): qty 1000

## 2021-07-24 MED ORDER — ONDANSETRON HCL 4 MG/2ML IJ SOLN: 4.0000 mg | Freq: Four times a day (QID) | INTRAMUSCULAR | Status: DC | PRN

## 2021-07-24 MED ORDER — ACETAMINOPHEN 650 MG RE SUPP: 650.0000 mg | Freq: Four times a day (QID) | RECTAL | Status: DC | PRN

## 2021-07-24 MED ORDER — EPINEPHRINE HCL 5 MG/250ML IV SOLN IN NS
0.5000 ug/min | INTRAVENOUS | Status: DC
  Administered 2021-07-24: 1.5 ug/min via INTRAVENOUS
  Filled 2021-07-24 (×3): qty 250

## 2021-07-24 MED ORDER — FENTANYL CITRATE PF 50 MCG/ML IJ SOSY
PREFILLED_SYRINGE | INTRAMUSCULAR | Status: AC
  Administered 2021-07-24: 100 ug via INTRAVENOUS
  Filled 2021-07-24: qty 2

## 2021-07-24 MED ORDER — ALBUTEROL SULFATE (2.5 MG/3ML) 0.083% IN NEBU
2.5000 mg | INHALATION_SOLUTION | Freq: Once | RESPIRATORY_TRACT | Status: AC
  Administered 2021-07-24: 2.5 mg via RESPIRATORY_TRACT
  Filled 2021-07-24: qty 3

## 2021-07-24 MED ORDER — ATROPINE SULFATE 1 MG/ML IJ SOLN
INTRAMUSCULAR | Status: AC
  Filled 2021-07-24: qty 1

## 2021-07-24 MED ORDER — ACETAMINOPHEN 325 MG PO TABS: 650.0000 mg | ORAL_TABLET | Freq: Four times a day (QID) | ORAL | Status: DC | PRN

## 2021-07-24 MED ORDER — ATROPINE SULFATE 1 MG/ML IJ SOLN
INTRAMUSCULAR | Status: AC | PRN
  Administered 2021-07-24: 1 mg via INTRAVENOUS

## 2021-07-24 MED ORDER — SODIUM BICARBONATE 8.4 % IV SOLN
INTRAVENOUS | Status: AC
  Filled 2021-07-24: qty 150

## 2021-07-26 MED FILL — Medication: Qty: 1 | Status: AC

## 2021-07-27 LAB — URINE CULTURE: Culture: >=100000 — AB

## 2021-07-29 LAB — CULTURE, BLOOD (ROUTINE X 2)
Culture: NO GROWTH
Culture: NO GROWTH
Special Requests: ADEQUATE
Special Requests: ADEQUATE

## 2021-08-12 NOTE — ED Notes (Signed)
Pt taken to morgue. 

## 2021-08-12 NOTE — ED Notes (Signed)
Pt being combative while RN attempting to establish IV access. Dr. Stark Jock at bedside

## 2021-08-12 NOTE — ED Provider Notes (Addendum)
Physical Exam  BP (!) 143/55   Pulse (!) 50   Temp 97.7 F (36.5 C) (Oral)   Resp 17   Ht '5\' 3"'$  (1.6 m)   Wt 73 kg   SpO2 97%   BMI 28.51 kg/m   Physical Exam Vitals and nursing note reviewed.  Constitutional:      General: She is not in acute distress.    Appearance: She is well-developed. She is not diaphoretic.     Comments: Patient is awake, but demented and agitated.  She is uncooperative and removes clothing and covers as well as attempts to remove IVs and electrodes.  HENT:     Head: Normocephalic and atraumatic.  Cardiovascular:     Rate and Rhythm: Normal rate and regular rhythm.     Heart sounds: No murmur heard.   No friction rub. No gallop.  Pulmonary:     Effort: Pulmonary effort is normal. No respiratory distress.     Breath sounds: Normal breath sounds. No wheezing.  Abdominal:     General: Bowel sounds are normal. There is no distension.     Palpations: Abdomen is soft.     Tenderness: There is no abdominal tenderness.  Musculoskeletal:        General: Normal range of motion.     Cervical back: Normal range of motion and neck supple.  Skin:    General: Skin is warm and dry.  Neurological:     Comments: Patient moves all 4 extremities, but is confused and demented.  Full neurologic exam difficult secondary to this.    ED Course/Procedures     Procedures  MDM  Care assumed from Dr. Sabra Heck at shift change.  Patient sent from her extended care facility for evaluation of agitation and combativeness.  She was found to have a urinary tract infection and is awaiting laboratory studies to return.  Her initial CMP revealed a potassium greater than 7.5.  This is in the process of being repeated with disposition pending these results.  Potassium has returned and is 7.6.  Repeat EKG shows perhaps widening of the QRS.  Lokelma, calcium, and glucose/insulin ordered.  I have spoken with the hospitalist who agrees to admit.  CRITICAL CARE Performed by: Veryl Speak Total critical care time: 90 minutes Critical care time was exclusive of separately billable procedures and treating other patients. Critical care was necessary to treat or prevent imminent or life-threatening deterioration. Critical care was time spent personally by me on the following activities: development of treatment plan with patient and/or surrogate as well as nursing, discussions with consultants, evaluation of patient's response to treatment, examination of patient, obtaining history from patient or surrogate, ordering and performing treatments and interventions, ordering and review of laboratory studies, ordering and review of radiographic studies, pulse oximetry and re-evaluation of patient's condition.        Veryl Speak, MD Aug 05, 2021 0134   ADDENDUM:  Patient was to be admitted to the hospitalist service, however patient suddenly became bradycardic, less responsive, then ultimately went asystolic.  CPR was initiated and patient intubated using the glide scope.  A 7.5 endotracheal tube was placed using the glide scope.  Placement confirmed with direct visualization, end-tidal CO2, and auscultation over the chest and stomach.  CPR was continued and atropine and epinephrine were administered as well as calcium.  Patient eventually returned a cardiac rhythm to the monitor and pulses became palpable.  This recurred on several other occasions requiring cardiac meds, CPR, and vigorous resuscitation.  Care discussed with Dr. Carson Myrtle from critical care at [facility].  He has made recommendations regarding laboratory studies and ventilator settings, but has informed me there are no beds at [facility] and cannot accept her.  I have spoken again with Dr. Clearence Ped who is in the process of determining the appropriate disposition.      Veryl Speak, MD 08/15/21 [phone]

## 2021-08-12 NOTE — ED Notes (Signed)
Increased Peep back to 10 -- saturation 91.

## 2021-08-12 NOTE — Discharge Summary (Signed)
Death Summary  [name] [id number] DOB: March 23, 1936 DOA: 08/13/21  PCP: Jani Gravel, MD  Admit date: Aug 13, 2021 Date of Death: August 14, 2021 Time of Death: 0832 Notification: Jani Gravel, MD notified of death of 08/14/2021   History of present illness:  Patient was an 85 year old female with a history of asthma, bronchitis, CAD, severe dementia, type 2 diabetes, hypertension, gout, history of CVA, paroxysmal atrial fibrillation who presented to the emergency department with agitation initially.  Patient was reportedly more combative than normal.  Patient was subsequently referred to the emergency department where she was found to be hyperkalemic with a potassium of over 7.  Nephrology was initially consulted in the emergency department at which point patient cardiac arrested.  Following CPR, ROSC was achieved.  Patient was deemed not a candidate for dialysis per nephrology given her history of severe dementia.  Final Diagnoses:   Post cardiac arrest Cardiac arrest was most likely secondary to electrolyte disturbances and acidosis Critical care service was consulted initially, awaiting transfer to higher level of care Patient was not a dialysis candidate per nephrology on her history of severe dementia Patient was continued with mechanical ventilation pending transfer While awaiting transfer, patient cardiac arrested again on the morning of 2021/08/14. Despite maximal efforts, further resuscitation was deemed to be futile. Case was discussed with POA, who was DSS who agrees with no further escalation of care and transition to DNR Family remained at bedside. On Aug 14, 2021 at 0839, she was pronounced with no palpable pulse, brainstem reflex or spontaneous breaths Acute renal failure In setting of hypovolemia and UTI Was initially continued on Rocephin Avoid nephrotoxic agents when possible Creatinine up to 3.7, baseline 2.7 Nephrology consulted, patient not a dialysis candidate Acute  metabolic encephalopathy Likely multifactorial with sepsis, elevated BUN, electrolyte disturbances all contributing Sepsis secondary to UTI, present on admission Continued empirically on Rocephin Patient is a temperature 83, leukopenia at 3.2, lactic acid 3.5, AKI Was given IV fluid hydration Hyperkalemia Calcium gluconate, bicarb, albuterol, Lokelma, IV fluids, insulin Trend potassium on morning labs Mild protein calorie malnutrition End of Life, DNR status Presented in grave condition post-cardiac arrest Despite maximal efforts, patient's condition continued to deteriorate  On discussion with POA and family, decision was made to transition to DNR status Shortly thereafter patient became progressively more bradycardic and ultimately lost pulse. Patient was pronounced at 0839 on 08-14-2021 Hypoalbuminemia  Albumin of 2.6 at time of presentation   The results of significant diagnostics from this hospitalization (including imaging, microbiology, ancillary and laboratory) are listed below for reference.    Significant Diagnostic Studies: DG Chest Portable 1 View  Result Date: 2021-08-14 CLINICAL DATA:  Pt had AED pads attached. Pt coded and intubated. Hx of asthma EXAM: PORTABLE CHEST - 1 VIEW COMPARISON:  Earlier film from the same day FINDINGS: The endotracheal tube is been repositioned, tip now 4.5 cm above carina. External pads and monitoring leads overlie the patient. Slight worsening of interstitial and alveolar edema or infiltrates, right greater than left. Heart size and mediastinal contours are within normal limits. Aortic Atherosclerosis (ICD10-170.0). No effusion.  No pneumothorax. Vertebral endplate spurring at multiple levels in the mid and lower thoracic spine. IMPRESSION: 1. Repositioning of the endotracheal tube, tip 4.5 cm above carina. 2. Slight worsening of bilateral infiltrates or edema, right greater than left Electronically Signed   By: Lucrezia Europe M.D.   On: 08-14-2021 09:01    DG Chest Port 1 View  Result Date: 08/14/21 CLINICAL DATA:  Sudden cardiac arrest,  status post intubation EXAM: PORTABLE CHEST 1 VIEW COMPARISON:  01/14/2021 FINDINGS: Endotracheal tube preferentially intubates the right mainstem bronchus. Withdrawal approximately 5 cm is suggested. Multifocal patchy/interstitial opacities in the lungs bilaterally, favoring multifocal pneumonia over moderate interstitial/alveolar edema. No pleural effusions. The heart is normal in size.  Overlying defibrillator pads. IMPRESSION: Endotracheal tube preferentially intubates the right mainstem bronchus. Withdrawal approximately 5 cm is suggested. Multifocal patchy/interstitial opacities in the lungs bilaterally, favoring multifocal pneumonia over moderate interstitial/alveolar edema. No definite pleural effusions. Electronically Signed   By: Julian Hy M.D.   On: August 10, 2021 02:52    Microbiology: Recent Results (from the past 240 hour(s))  Resp Panel by RT-PCR (Flu A&B, Covid) Nasopharyngeal Swab     Status: None   Collection Time: 2021/08/10 12:26 AM   Specimen: Nasopharyngeal Swab; Nasopharyngeal(NP) swabs in vial transport medium  Result Value Ref Range Status   SARS Coronavirus 2 by RT PCR NEGATIVE NEGATIVE Final    Comment: (NOTE) SARS-CoV-2 target nucleic acids are NOT DETECTED.  The SARS-CoV-2 RNA is generally detectable in upper respiratory specimens during the acute phase of infection. The lowest concentration of SARS-CoV-2 viral copies this assay can detect is 138 copies/mL. A negative result does not preclude SARS-Cov-2 infection and should not be used as the sole basis for treatment or other patient management decisions. A negative result may occur with  improper specimen collection/handling, submission of specimen other than nasopharyngeal swab, presence of viral mutation(s) within the areas targeted by this assay, and inadequate number of viral copies(<138 copies/mL). A negative result  must be combined with clinical observations, patient history, and epidemiological information. The expected result is Negative.  Fact Sheet for Patients:  EntrepreneurPulse.com.au  Fact Sheet for Healthcare Providers:  IncredibleEmployment.be  This test is no t yet approved or cleared by the [place] FDA and  has been authorized for detection and/or diagnosis of SARS-CoV-2 by FDA under an Emergency Use Authorization (EUA). This EUA will remain  in effect (meaning this test can be used) for the duration of the COVID-19 declaration under Section 564(b)(1) of the Act, 21 U.S.C.section 360bbb-3(b)(1), unless the authorization is terminated  or revoked sooner.       Influenza A by PCR NEGATIVE NEGATIVE Final   Influenza B by PCR NEGATIVE NEGATIVE Final    Comment: (NOTE) The Xpert Xpress SARS-CoV-2/FLU/RSV plus assay is intended as an aid in the diagnosis of influenza from Nasopharyngeal swab specimens and should not be used as a sole basis for treatment. Nasal washings and aspirates are unacceptable for Xpert Xpress SARS-CoV-2/FLU/RSV testing.  Fact Sheet for Patients: EntrepreneurPulse.com.au  Fact Sheet for Healthcare Providers: IncredibleEmployment.be  This test is not yet approved or cleared by the [place] FDA and has been authorized for detection and/or diagnosis of SARS-CoV-2 by FDA under an Emergency Use Authorization (EUA). This EUA will remain in effect (meaning this test can be used) for the duration of the COVID-19 declaration under Section 564(b)(1) of the Act, 21 U.S.C. section 360bbb-3(b)(1), unless the authorization is terminated or revoked.  Performed at [facility], [address] [place],  [address]   Culture, blood (routine x 2)     Status: None (Preliminary result)   Collection Time: 08-10-21  5:03 AM   Specimen: Right Antecubital; Blood  Result Value Ref Range  Status   Specimen Description RIGHT ANTECUBITAL  Final   Special Requests   Final    BOTTLES DRAWN AEROBIC AND ANAEROBIC Blood Culture adequate volume   Culture  Final    NO GROWTH <12 HOURS Performed at [facility], [address] [place], [place] [address]    Report Status PENDING  Incomplete  Culture, blood (routine x 2)     Status: None (Preliminary result)   Collection Time: 07-29-21  5:15 AM   Specimen: BLOOD RIGHT HAND  Result Value Ref Range Status   Specimen Description BLOOD RIGHT HAND  Final   Special Requests   Final    BOTTLES DRAWN AEROBIC ONLY Blood Culture adequate volume   Culture   Final    NO GROWTH <12 HOURS Performed at [facility], [address] [place], [place] [address]    Report Status PENDING  Incomplete     Labs: Basic Metabolic Panel: Recent Labs  Lab 08/03/2021 2103 08/05/2021 2233 July 29, 2021 0323 July 29, 2021 0504  NA 145 145 149* 153*  K >7.5* 7.6* 5.5* 4.6  CL 127* 125* 126* 127*  CO2 16* 16* 15* 20*  GLUCOSE 106* 105* 147* 184*  BUN 79* 79* 80* 79*  CREATININE 3.69* 3.70* 3.65* 3.60*  CALCIUM 9.2 9.2 9.9 9.9  MG  --   --   --  2.4  PHOS  --   --   --  3.9   Liver Function Tests: Recent Labs  Lab 07/30/2021 2103 07/30/2021 2233 07-29-21 0504  AST 86* 83* 157*  ALT 59* 58* 78*  ALKPHOS 208* 203* 223*  BILITOT 0.3 0.2* 0.4  PROT 6.9 6.7 5.8*  ALBUMIN 3.5 3.4* 2.6*   No results for input(s): LIPASE, AMYLASE in the last 168 hours. No results for input(s): AMMONIA in the last 168 hours. CBC: Recent Labs  Lab 07/19/2021 2103 2021-07-29 0504  WBC 3.2* 7.2  NEUTROABS 2.2 5.0  HGB 10.4* 10.4*  HCT 35.7* 34.5*  MCV 91.5 88.9  PLT 90* 80*   Cardiac Enzymes: No results for input(s): CKTOTAL, CKMB, CKMBINDEX, TROPONINI in the last 168 hours. D-Dimer No results for input(s): DDIMER in the last 72 hours. BNP: Invalid input(s): POCBNP CBG: Recent Labs  Lab 2021-07-29 0029 07/29/21 0719  GLUCAP 100* 198*   Anemia work up No results for  input(s): VITAMINB12, FOLATE, FERRITIN, TIBC, IRON, RETICCTPCT in the last 72 hours. Urinalysis    Component Value Date/Time   COLORURINE YELLOW 07/19/2021 2129   APPEARANCEUR HAZY (A) 07/17/2021 2129   LABSPEC 1.014 07/22/2021 2129   PHURINE 6.0 08/09/2021 2129   GLUCOSEU NEGATIVE 07/19/2021 2129   HGBUR SMALL (A) 07/17/2021 2129   BILIRUBINUR NEGATIVE 08/05/2021 2129   [place] NEGATIVE 08/10/2021 2129   PROTEINUR 30 (A) 08/02/2021 2129   UROBILINOGEN 0.2 07/09/2014 0553   NITRITE POSITIVE (A) 08/10/2021 2129   LEUKOCYTESUR LARGE (A) 07/19/2021 2129   Sepsis Labs Invalid input(s): PROCALCITONIN,  WBC,  LACTICIDVEN    SIGNED:  Marylu Lund, MD  [facility] 07/29/21, 5:58 PM  If 7PM-7AM, please contact night-coverage [email or web address] Password [email or web address]

## 2021-08-12 NOTE — Treatment Plan (Signed)
Overnight events noted. Pt presented with severe electrolyte abnormalities and metabolic acidosis in the setting of CKD. Cardiac arrested overnight, now on ventillatorr. Nephrology consulted overnight. Pt determined to not be a dialysis candidate. This AM, pt again cardiac arrested, see documentation. Ultimately was abl to have pulse after multiple additional pressor support. At this point, further efforts at resuscitation would likely cause more harm than good. Pt is ward of state, have contacted DSS on call who agrees with decision for no escalation of care. DNR order placed.

## 2021-08-12 NOTE — ED Notes (Addendum)
Pt removing all monitoring devices, RN replaced all equipment as pt needs to be on cardiac monitor.  Pt still attempting to rip everything off. [employer] made aware, orders placed.

## 2021-08-12 NOTE — ED Notes (Signed)
Update on pt status given to [facility]

## 2021-08-12 NOTE — Progress Notes (Signed)
2 attempts made to call daughter listed as alternate contact. Voicemail left. Phone number given to call the ER back.

## 2021-08-12 NOTE — ED Notes (Signed)
Date and time results received: 07/26/21 0315 (use smartphrase ".now" to insert current time)  Test: abg Critical Value: ph 7.134  Name of Provider Notified: Delo  Orders Received? Or Actions Taken?: acknowledged

## 2021-08-12 NOTE — ED Notes (Signed)
No pulses felt by Dr. Wyline Copas. Time of death called at [phone]

## 2021-08-12 NOTE — ED Notes (Signed)
0748 hrs : Dr. Eulis Foster notified of pt declining bp and pulse.  0749hrs: Dr. Eulis Foster at bedside 0750 hrs : Code called for no pulse  0753 hrs:  '1mg'$  Atropine given 0754 hrs: '1mg'$  Epinephrine 0756 hrs: '1mg'$  Epinephrine 0756 hrs: Bicarb drip increased to bolus until NS bolus could be initiated 0759 hrs: '1mg'$  Epinephrine 0802 hrs: Respiratory Therapist arrived 0803 hrs: Dr. Vonzella Nipple to stop CPR, Dr continued to monitor pulse 0807 hrs: Dr requested recycle bp 193/99 0809 hrs: pulse check  0809 hrs: Dr requested Blood Gas  0811 hrs: chest xray ordered. 0812 hrs: Code ended. Pulse and BP noted.   Family in room during code.

## 2021-08-12 NOTE — ED Notes (Signed)
Dr. Wyline Copas at bedside and confirms pt's code status is now DNR. Pt's HR now dropping down in the 40's again. Family at bedside.

## 2021-08-12 NOTE — ED Notes (Signed)
Tube pulled back after chest x-ray to 22, '10mg'$  albuterol given via aerogen for high potassium.

## 2021-08-12 NOTE — ED Notes (Signed)
Family at bedside. 

## 2021-08-12 NOTE — ED Provider Notes (Signed)
7:50 AM-CODE BLUE called.  Precipitated by relatively sudden drop of blood pressure and pulse.  Patient evaluated, she is pulseless.  Cardiac rate irregular, mid 80s.  Chest compressions begun.  IV epinephrine 1 mg, repeated twice.  Frothy bloody secretions in ET tube, and ventilator tubing device.  Multiple suctions of ET tube done by staff.  Oxygen saturation low, 60s.  Chest compressions continued for about 10 minutes.  Bagged through ET tube by respiratory therapist.  At that point, no palpable inguinal pulse.  Chest compressions stopped at 8:02 AM.  After that I checked carotid pulse.  Pulse present, irregular around 80.  At this point blood pressure cuff was cycled and she had an elevated blood pressure.  She is hooked up to the ventilator again.  Blood pressure and oxygen saturations stabilized.  Hospitalist, Dr. Wyline Copas at the bedside, talked to the power of attorney and the patient was made DNR.  Note that patient was not alert, or resistive during resuscitation efforts.  She has not received sedation.  Chest x-ray repeated, ET tube satisfactory, congestive heart failure appears to be present.  Previous reading equivocal for infiltrates versus edema.  We will continue to observe and monitor patient, assisting the hospitalist with care at this critical point.  .Critical Care  Date/Time: 07/28/21 8:32 AM Performed by: Daleen Bo, MD Authorized by: Daleen Bo, MD   Critical care provider statement:    Critical care time (minutes):  35   Critical care start time:  07-28-2021 7:50 AM   Critical care end time:  2021-07-28 8:32 AM   Critical care time was exclusive of:  Separately billable procedures and treating other patients   Critical care was necessary to treat or prevent imminent or life-threatening deterioration of the following conditions:  Cardiac failure   Critical care was time spent personally by me on the following activities:  Blood draw for specimens, development of treatment  plan with patient or surrogate, discussions with consultants, evaluation of patient's response to treatment, examination of patient, obtaining history from patient or surrogate, ordering and performing treatments and interventions, ordering and review of laboratory studies, pulse oximetry, re-evaluation of patient's condition, review of old charts and ordering and review of radiographic studies    Daleen Bo, MD July 28, 2021 [phone]

## 2021-08-12 NOTE — Plan of Care (Signed)
Received page at 1:51 am from hospitalist re: this patient re: severe hyperkalemia.  Received a follow-up page at 1:56 am.  Spoke with Katie Zierle-Ghosh, DO.  She has arrested and at this time she has a pulse back.  They are contacting critical care  They are starting bicarb gtt.  Would also recommend bicarbonate 2 amps to temporize as well as calcium gluconate.  Would administer albuterol neb if able.  Kayexalate enteric tube or enema an option once patient stabilized (30 gram)  Please note that she has severe dementia and is not a dialysis candidate.   Claudia Desanctis, MD 07/27/2021 2:14 AM

## 2021-08-12 NOTE — ED Notes (Signed)
Bear hugger applied 

## 2021-08-12 NOTE — H&P (Signed)
TRH H&P    Patient Demographics:    [name], is a 85 y.o. female  MRN: [id number]  DOB - 20-Jan-1936  Admit Date - 07/20/2021  Referring MD/NP/PA: Stark Jock  Outpatient Primary MD for the patient is Jani Gravel, MD  Patient coming from: [facility]  Chief complaint- Agitation   HPI:    [name]  is a 85 y.o. female, with history of asthma, bronchitis, coronary artery disease, dementia, diabetes mellitus type 2, essential hypertension, gout, history of stroke, hypothyroidism, nonischemic cardiomyopathy, paroxysmal atrial fibrillation, and more presents the ED with a chief complaint of agitation.  This patient is a resident of the dementia unit at the [facility].  History is limited and that patient is not able to provide any history herself secondary to intubation.  Staff reported that patient had become combative with other residents and staff and she was sent here for evaluation.  Upon arrival to the ED patient was very combative.  She was given Geodon which did not have much effect and required several staff members working together to even just get an IV.  Patient had pulled the nurses hair, grabbed another nurses shirt etc.  When blood work did come back and showed an elevated potassium that was thought to be hemolyzed secondary to patient being so combative during blood draw.  Repeat potassium showed 7.6.  Admission was requested at that time.  At initial consult, VBG was ordered.  pH showed 7.0.  Consult was called out to nephrology to assess for possible emergent dialysis, when patient arrested.  It is reported that patient had progressively worsening bradycardia until she lost a pulse.  ROSC was achieved.  2 A of bicarb were given during the code, and bicarb drip was started after the code.  Nephrology has reviewed the chart and reports the patient will not be a dialysis candidate secondary to severe  dementia.  Several attempts were made to contact family by myself without success.  ER doctor was able to contact family and several family members are at bedside.  Apparently this patient is a ward of the state.  For reasons unclear, it was determined that her 15 children were not capable of making decisions for her and so patient became a ward of the state.  ED provider spoke with her representative who reported it would take an excessive period of time to get her status adjusted to DNR.  I advised the ER to speak with critical care at [facility] about this patient.  They have accepted her but they have no beds.  They are not managing care at this point.  I have discussed with them that we have no intensivist here, and also no ICU beds which means no ICU nurse for this patient.  At this time [facility] hospitalist will be managing this care of this patient with E-Link consult.   Nephrology also put a care plan in.  They advised 2 A of bicarb which patient received during her code.  Bicarb drip to continue.  Advised calcium gluconate as  well which patient had received for hyperkalemia.  She also recommended Kayexalate per enteric tube since patient cannot take p.o. Lokelma.  Enteric tube and Kayexalate ordered.  Also they advised albuterol which was ordered.  Patient already had amp D50/insulin earlier as part of the hyperkalemic treatment.  Other details from the ED Post cardiac arrest temperature is 83, bear hugger on Patient was leukopenic at 3.2, hemoglobin 10.4 Sodium 145, potassium 7.6, bicarb 16, BUN 79, creatinine 3.70 EKG shows heart rate of 47, junctional rhythm, QTC 556 prior to cardiac arrest     Review of systems:  Review of systems cannot be obtained secondary to intubation    Past History of the following :    Past Medical History:  Diagnosis Date   Asthma    Bronchitis    Carotid artery disease ([place])    Coronary atherosclerosis of native coronary artery    Nonobstructive at cath  2005   Dementia [facility])    DM2 (diabetes mellitus, type 2) ([place])    Essential hypertension    Gout    History of stroke    Hypothyroidism    Influenza 2013   Nonischemic cardiomyopathy ([place])    LVEF 20-25%   PAF (paroxysmal atrial fibrillation) ([place])    a. diagnosed in 04/2018. Started on Eliquis for anticoagulation.    Renal artery stenosis (HCC)    Bilateral      Past Surgical History:  Procedure Laterality Date   ABDOMINAL HYSTERECTOMY     CATARACT EXTRACTION W/PHACO Left 01/18/2019   Procedure: CATARACT EXTRACTION PHACO AND INTRAOCULAR LENS PLACEMENT (IOC);  Surgeon: Baruch Goldmann, MD;  Location: [facility];  Service: Ophthalmology;  Laterality: Left;  CDE: 11.99   CATARACT EXTRACTION W/PHACO Right 02/26/2019   Procedure: CATARACT EXTRACTION PHACO AND INTRAOCULAR LENS PLACEMENT RIGHT EYE (CDE: 13.46);  Surgeon: Baruch Goldmann, MD;  Location: [facility];  Service: Ophthalmology;  Laterality: Right;      Social History:      Social History   Tobacco Use   Smoking status: Never   Smokeless tobacco: Never  Substance Use Topics   Alcohol use: No    Alcohol/week: 0.0 standard drinks       Family History :     Family History  Problem Relation Age of Onset   Asthma Son    Family history could not be reviewed secondary to intubation   Home Medications:   Prior to Admission medications   Medication Sig Start Date End Date Taking? Authorizing Provider  apixaban (ELIQUIS) 2.5 MG TABS tablet Take 1 tablet (2.5 mg total) by mouth 2 (two) times daily. 02/10/21   Richardson Dopp T, PA-C  atorvastatin (LIPITOR) 10 MG tablet Take 1 tablet (10 mg total) by mouth daily at 6 PM. 03/17/20   Johnson, Clanford L, MD  carvedilol (COREG) 6.25 MG tablet Take 1 tablet (6.25 mg total) by mouth 2 (two) times daily with a meal. 03/17/20   Johnson, Clanford L, MD  cholecalciferol (VITAMIN D) 25 MCG (1000 UT) tablet Take 1,000 Units by mouth 2 (two) times daily.    [provider]  donepezil  (ARICEPT) 10 MG tablet Take 10 mg by mouth daily.    [provider]  fluticasone (FLONASE) 50 MCG/ACT nasal spray Place 1 spray into both nostrils daily as needed for allergies or rhinitis.  01/03/19   [provider]  gabapentin (NEURONTIN) 100 MG capsule Take 100 mg by mouth 3 (three) times daily. 04/26/21   [provider]  hydrALAZINE (APRESOLINE) 25 MG tablet Take 3 tablets (75 mg total) by mouth every 8 (eight) hours. 03/17/20   Johnson, Clanford L, MD  hydrOXYzine (ATARAX/VISTARIL) 25 MG tablet Take 25 mg by mouth 2 (two) times daily. 03/31/21   [provider]  isosorbide mononitrate (IMDUR) 30 MG 24 hr tablet TAKE 1 TABLET BY MOUTH ONCE A DAY. 10/08/20   Satira Sark, MD  levETIRAcetam (KEPPRA) 500 MG tablet Take 1 tablet (500 mg total) by mouth 2 (two) times daily. 01/01/20   Barton Dubois, MD  memantine (NAMENDA) 5 MG tablet Take 1 tablet (5 mg total) by mouth 2 (two) times daily. 03/17/20   Johnson, Clanford L, MD  OXYGEN Inhale 2 L into the lungs daily as needed (for shortness of breath).    [provider]     Allergies:     Allergies  Allergen Reactions   Yellow Jacket Venom [Bee Venom] Shortness Of Breath and Itching     Physical Exam:   Vitals  Blood pressure (!) 100/58, pulse 62, temperature (!) 83.7 F (28.7 C), resp. rate (!) 26, height '5\' 3"'$  (1.6 m), weight 73 kg, SpO2 100 %.  1.  General: Patient lying supine in bed, on ventilator   2. Psychiatric: Psychiatric assessment could not be obtained secondary to intubation   3. Neurologic: Patient is sedated, on ventilator, not withdrawing from pain   4. HEENMT:  Head is atraumatic, normocephalic, pupils reactive to light, neck is supple, trachea is midline, mucous membranes are dry   5. Respiratory : Lungs are clear to auscultation bilaterally without wheezing, rhonchi, rales, no cyanosis, on mechanical ventilator  6. Cardiovascular : Heart rate normal, rhythm is  regular, no murmurs, rubs or gallops, no peripheral edema, peripheral pulses palpated   7. Gastrointestinal:  Abdomen is soft, nondistended, nontender to palpation bowel sounds active, no masses or organomegaly palpated   8. Skin:  Skin is cool, dry and intact without rashes, acute lesions, or ulcers on limited exam   9.Musculoskeletal:  No acute deformities or trauma, no asymmetry in tone, no peripheral edema, peripheral pulses palpated, no tenderness to palpation in the extremities     Data Review:    CBC Recent Labs  Lab 07/29/2021 2103  WBC 3.2*  HGB 10.4*  HCT 35.7*  PLT 90*  MCV 91.5  MCH 26.7  MCHC 29.1*  RDW 17.7*  LYMPHSABS 0.8  MONOABS 0.1  EOSABS 0.0  BASOSABS 0.0   ------------------------------------------------------------------------------------------------------------------  Results for orders placed or performed during the hospital encounter of 08/11/2021 (from the past 48 hour(s))  CBC with Differential/Platelet     Status: Abnormal   Collection Time: 07/19/2021  9:03 PM  Result Value Ref Range   WBC 3.2 (L) 4.0 - 10.5 K/uL   RBC 3.90 3.87 - 5.11 MIL/uL   Hemoglobin 10.4 (L) 12.0 - 15.0 g/dL   HCT 35.7 (L) 36.0 - 46.0 %   MCV 91.5 80.0 - 100.0 fL   MCH 26.7 26.0 - 34.0 pg   MCHC 29.1 (L) 30.0 - 36.0 g/dL   RDW 17.7 (H) 11.5 - 15.5 %   Platelets 90 (L) 150 - 400 K/uL    Comment: SPECIMEN CHECKED FOR CLOTS   nRBC 1.0 (H) 0.0 - 0.2 %   Neutrophils Relative % 70 %   Neutro Abs 2.2 1.7 - 7.7 K/uL   Lymphocytes Relative 24 %   Lymphs Abs 0.8 0.7 - 4.0 K/uL   Monocytes Relative 4 %   Monocytes  Absolute 0.1 0.1 - 1.0 K/uL   Eosinophils Relative 1 %   Eosinophils Absolute 0.0 0.0 - 0.5 K/uL   Basophils Relative 0 %   Basophils Absolute 0.0 0.0 - 0.1 K/uL   Immature Granulocytes 1 %   Abs Immature Granulocytes 0.03 0.00 - 0.07 K/uL    Comment: Performed at [facility], [address] [place], [place] [address]  Comprehensive metabolic panel     Status:  Abnormal   Collection Time: 07/22/2021  9:03 PM  Result Value Ref Range   Sodium 145 135 - 145 mmol/L   Potassium >7.5 (HH) 3.5 - 5.1 mmol/L    Comment: CRITICAL RESULT CALLED TO, READ BACK BY AND VERIFIED WITH: BRAME,M ON 07/18/2021 AT 2205 BY LOY,C    Chloride 127 (H) 98 - 111 mmol/L   CO2 16 (L) 22 - 32 mmol/L   Glucose, Bld 106 (H) 70 - 99 mg/dL    Comment: Glucose reference range applies only to samples taken after fasting for at least 8 hours.   BUN 79 (H) 8 - 23 mg/dL   Creatinine, Ser 3.69 (H) 0.44 - 1.00 mg/dL   Calcium 9.2 8.9 - 10.3 mg/dL   Total Protein 6.9 6.5 - 8.1 g/dL   Albumin 3.5 3.5 - 5.0 g/dL   AST 86 (H) 15 - 41 U/L   ALT 59 (H) 0 - 44 U/L   Alkaline Phosphatase 208 (H) 38 - 126 U/L   Total Bilirubin 0.3 0.3 - 1.2 mg/dL   GFR, Estimated 12 (L) >60 mL/min    Comment: (NOTE) Calculated using the CKD-EPI Creatinine Equation (2021)    Anion gap <3 (L) 5 - 15    Comment: Electrolytes repeated to confirm. Performed at [facility], [address] [place], [place] [address]   Urinalysis, Routine w reflex microscopic Urine, Catheterized     Status: Abnormal   Collection Time: 07/27/2021  9:29 PM  Result Value Ref Range   Color, Urine YELLOW YELLOW   APPearance HAZY (A) CLEAR   Specific Gravity, Urine 1.014 1.005 - 1.030   pH 6.0 5.0 - 8.0   Glucose, UA NEGATIVE NEGATIVE mg/dL   Hgb urine dipstick SMALL (A) NEGATIVE   Bilirubin Urine NEGATIVE NEGATIVE   Ketones, ur NEGATIVE NEGATIVE mg/dL   Protein, ur 30 (A) NEGATIVE mg/dL   Nitrite POSITIVE (A) NEGATIVE   Leukocytes,Ua LARGE (A) NEGATIVE   RBC / HPF 6-10 0 - 5 RBC/hpf   WBC, UA 21-50 0 - 5 WBC/hpf   Bacteria, UA RARE (A) NONE SEEN   Squamous Epithelial / LPF 0-5 0 - 5    Comment: Performed at [facility], [address] [place], [place] [address]  Comprehensive metabolic panel     Status: Abnormal   Collection Time: 07/18/2021 10:33 PM  Result Value Ref Range   Sodium 145 135 - 145 mmol/L   Potassium 7.6 (HH)  3.5 - 5.1 mmol/L    Comment: CRITICAL RESULT CALLED TO, READ BACK BY AND VERIFIED WITH: GESELL,K @ 2352 08/03/2021 BY STEPHTR    Chloride 125 (H) 98 - 111 mmol/L   CO2 16 (L) 22 - 32 mmol/L   Glucose, Bld 105 (H) 70 - 99 mg/dL    Comment: Glucose reference range applies only to samples taken after fasting for at least 8 hours.   BUN 79 (H) 8 - 23 mg/dL   Creatinine, Ser 3.70 (H) 0.44 - 1.00 mg/dL   Calcium 9.2 8.9 - 10.3 mg/dL  Total Protein 6.7 6.5 - 8.1 g/dL   Albumin 3.4 (L) 3.5 - 5.0 g/dL   AST 83 (H) 15 - 41 U/L   ALT 58 (H) 0 - 44 U/L   Alkaline Phosphatase 203 (H) 38 - 126 U/L   Total Bilirubin 0.2 (L) 0.3 - 1.2 mg/dL   GFR, Estimated 12 (L) >60 mL/min    Comment: (NOTE) Calculated using the CKD-EPI Creatinine Equation (2021)    Anion gap 4 (L) 5 - 15    Comment: Performed at [facility], [address] [place], [place] [address]  Resp Panel by RT-PCR (Flu A&B, Covid) Nasopharyngeal Swab     Status: None   Collection Time: 28-Jul-2021 12:26 AM   Specimen: Nasopharyngeal Swab; Nasopharyngeal(NP) swabs in vial transport medium  Result Value Ref Range   SARS Coronavirus 2 by RT PCR NEGATIVE NEGATIVE    Comment: (NOTE) SARS-CoV-2 target nucleic acids are NOT DETECTED.  The SARS-CoV-2 RNA is generally detectable in upper respiratory specimens during the acute phase of infection. The lowest concentration of SARS-CoV-2 viral copies this assay can detect is 138 copies/mL. A negative result does not preclude SARS-Cov-2 infection and should not be used as the sole basis for treatment or other patient management decisions. A negative result may occur with  improper specimen collection/handling, submission of specimen other than nasopharyngeal swab, presence of viral mutation(s) within the areas targeted by this assay, and inadequate number of viral copies(<138 copies/mL). A negative result must be combined with clinical observations, patient history, and epidemiological information.  The expected result is Negative.  Fact Sheet for Patients:  EntrepreneurPulse.com.au  Fact Sheet for Healthcare Providers:  IncredibleEmployment.be  This test is no t yet approved or cleared by the [place] FDA and  has been authorized for detection and/or diagnosis of SARS-CoV-2 by FDA under an Emergency Use Authorization (EUA). This EUA will remain  in effect (meaning this test can be used) for the duration of the COVID-19 declaration under Section 564(b)(1) of the Act, 21 U.S.C.section 360bbb-3(b)(1), unless the authorization is terminated  or revoked sooner.       Influenza A by PCR NEGATIVE NEGATIVE   Influenza B by PCR NEGATIVE NEGATIVE    Comment: (NOTE) The Xpert Xpress SARS-CoV-2/FLU/RSV plus assay is intended as an aid in the diagnosis of influenza from Nasopharyngeal swab specimens and should not be used as a sole basis for treatment. Nasal washings and aspirates are unacceptable for Xpert Xpress SARS-CoV-2/FLU/RSV testing.  Fact Sheet for Patients: EntrepreneurPulse.com.au  Fact Sheet for Healthcare Providers: IncredibleEmployment.be  This test is not yet approved or cleared by the [place] FDA and has been authorized for detection and/or diagnosis of SARS-CoV-2 by FDA under an Emergency Use Authorization (EUA). This EUA will remain in effect (meaning this test can be used) for the duration of the COVID-19 declaration under Section 564(b)(1) of the Act, 21 U.S.C. section 360bbb-3(b)(1), unless the authorization is terminated or revoked.  Performed at [facility], [address] [place], [place] [address]   CBG monitoring, ED     Status: Abnormal   Collection Time: 28-Jul-2021 12:29 AM  Result Value Ref Range   Glucose-Capillary 100 (H) 70 - 99 mg/dL    Comment: Glucose reference range applies only to samples taken after fasting for at least 8 hours.   Comment 1 Notify RN     Comment 2 Document in Chart   Blood gas, venous     Status: Abnormal   Collection Time: 07-28-21  1:31 AM  Result Value Ref Range   FIO2 21.00    pH, Ven 7.055 (LL) 7.250 - 7.430    Comment: CRITICAL RESULT CALLED TO, READ BACK BY AND VERIFIED WITH: GESELL,C @ 0148 ON Jul 30, 2021 BY JUW    pCO2, Ven 61.9 (H) 44.0 - 60.0 mmHg   pO2, Ven 36.8 32.0 - 45.0 mmHg   Bicarbonate 13.5 (L) 20.0 - 28.0 mmol/L   Acid-base deficit 12.2 (H) 0.0 - 2.0 mmol/L   O2 Saturation 57.9 %   Patient temperature 36.5     Comment: Performed at [facility], [address], [place], [place] [address]  Blood gas, arterial     Status: Abnormal   Collection Time: 2021/07/30  2:56 AM  Result Value Ref Range   FIO2 100.00    pH, Arterial 7.134 (LL) 7.350 - 7.450    Comment: CRITICAL RESULT CALLED TO, READ BACK BY AND VERIFIED WITH: EVANS,H @ 0308 ON 07-30-21 BY JUW    pCO2 arterial 57.7 (H) 32.0 - 48.0 mmHg   pO2, Arterial 351 (H) 83.0 - 108.0 mmHg   Bicarbonate 16.3 (L) 20.0 - 28.0 mmol/L   Acid-base deficit 9.1 (H) 0.0 - 2.0 mmol/L   O2 Saturation 99.6 %   Patient temperature 37.0    Allens test (pass/fail) PASS PASS    Comment: Performed at [facility], [address], [place], [place] [id number]  Basic metabolic panel     Status: Abnormal   Collection Time: Jul 30, 2021  3:23 AM  Result Value Ref Range   Sodium 149 (H) 135 - 145 mmol/L   Potassium 5.5 (H) 3.5 - 5.1 mmol/L    Comment: DELTA CHECK NOTED   Chloride 126 (H) 98 - 111 mmol/L   CO2 15 (L) 22 - 32 mmol/L   Glucose, Bld 147 (H) 70 - 99 mg/dL    Comment: Glucose reference range applies only to samples taken after fasting for at least 8 hours.   BUN 80 (H) 8 - 23 mg/dL   Creatinine, Ser 3.65 (H) 0.44 - 1.00 mg/dL   Calcium 9.9 8.9 - 10.3 mg/dL   GFR, Estimated 12 (L) >60 mL/min    Comment: (NOTE) Calculated using the CKD-EPI Creatinine Equation (2021)    Anion gap 8 5 - 15    Comment: Performed at [facility], [address] [place], [place]  [address]  Lactic acid, plasma     Status: Abnormal   Collection Time: 30-Jul-2021  3:23 AM  Result Value Ref Range   Lactic Acid, Venous 3.5 (HH) 0.5 - 1.9 mmol/L    Comment: CRITICAL RESULT CALLED TO, READ BACK BY AND VERIFIED WITH: GESL,K AT 4:30AM ON 2021/07/30 BY [facility] Performed at [facility], [address], [place], [place] [address]     Chemistries  Recent Labs  Lab 08/10/2021 2103 07/27/2021 2233 07/30/21 0323  NA 145 145 149*  K >7.5* 7.6* 5.5*  CL 127* 125* 126*  CO2 16* 16* 15*  GLUCOSE 106* 105* 147*  BUN 79* 79* 80*  CREATININE 3.69* 3.70* 3.65*  CALCIUM 9.2 9.2 9.9  AST 86* 83*  --   ALT 59* 58*  --   ALKPHOS 208* 203*  --   BILITOT 0.3 0.2*  --    ------------------------------------------------------------------------------------------------------------------  ------------------------------------------------------------------------------------------------------------------ GFR: Estimated Creatinine Clearance: 11.4 mL/min (A) (by C-G formula based on SCr of 3.65 mg/dL (H)). Liver Function Tests: Recent Labs  Lab 07/31/2021 2103 08/01/2021 2233  AST 86* 83*  ALT 59* 58*  ALKPHOS  208* 203*  BILITOT 0.3 0.2*  PROT 6.9 6.7  ALBUMIN 3.5 3.4*   No results for input(s): LIPASE, AMYLASE in the last 168 hours. No results for input(s): AMMONIA in the last 168 hours. Coagulation Profile: No results for input(s): INR, PROTIME in the last 168 hours. Cardiac Enzymes: No results for input(s): CKTOTAL, CKMB, CKMBINDEX, TROPONINI in the last 168 hours. BNP (last 3 results) No results for input(s): PROBNP in the last 8760 hours. HbA1C: No results for input(s): HGBA1C in the last 72 hours. CBG: Recent Labs  Lab August 23, 2021 0029  GLUCAP 100*   Lipid Profile: No results for input(s): CHOL, HDL, LDLCALC, TRIG, CHOLHDL, LDLDIRECT in the last 72 hours. Thyroid Function Tests: No results for input(s): TSH, T4TOTAL, FREET4, T3FREE, THYROIDAB in the last 72 hours. Anemia  Panel: No results for input(s): VITAMINB12, FOLATE, FERRITIN, TIBC, IRON, RETICCTPCT in the last 72 hours.  --------------------------------------------------------------------------------------------------------------- Urine analysis:    Component Value Date/Time   COLORURINE YELLOW 08/08/2021 2129   APPEARANCEUR HAZY (A) 07/15/2021 2129   LABSPEC 1.014 07/12/2021 2129   PHURINE 6.0 07/26/2021 2129   GLUCOSEU NEGATIVE 07/18/2021 2129   HGBUR SMALL (A) 07/16/2021 2129   BILIRUBINUR NEGATIVE 07/22/2021 2129   KETONESUR NEGATIVE 08/04/2021 2129   PROTEINUR 30 (A) 08/05/2021 2129   UROBILINOGEN 0.2 07/09/2014 0553   NITRITE POSITIVE (A) 08/06/2021 2129   LEUKOCYTESUR LARGE (A) 08/05/2021 2129      Imaging Results:    DG Chest Port 1 View  Result Date: 08/23/21 CLINICAL DATA:  Sudden cardiac arrest, status post intubation EXAM: PORTABLE CHEST 1 VIEW COMPARISON:  01/14/2021 FINDINGS: Endotracheal tube preferentially intubates the right mainstem bronchus. Withdrawal approximately 5 cm is suggested. Multifocal patchy/interstitial opacities in the lungs bilaterally, favoring multifocal pneumonia over moderate interstitial/alveolar edema. No pleural effusions. The heart is normal in size.  Overlying defibrillator pads. IMPRESSION: Endotracheal tube preferentially intubates the right mainstem bronchus. Withdrawal approximately 5 cm is suggested. Multifocal patchy/interstitial opacities in the lungs bilaterally, favoring multifocal pneumonia over moderate interstitial/alveolar edema. No definite pleural effusions. Electronically Signed   By: Julian Hy M.D.   On: August 23, 2021 02:52       Assessment & Plan:    Active Problems:   Type 2 diabetes with nephropathy (HCC)   Acute metabolic encephalopathy   Urinary tract infection without hematuria   AKI (acute kidney injury) ([place])   Cardiac arrest (HCC)   Hyperkalemia   Mild protein-calorie malnutrition (HCC)   Post cardiac  arrest Cardiac arrest was most likely secondary to electrolyte disturbances and acidosis Auto cooling with a temperature of 83 Warming blanket on with temperature goal 96 and postcardiac arrest status Attempt to transfer to [facility] was denied due to lack of beds Recommendations from PCCM appreciated Patient is not a dialysis candidate per nephrology -continue bicarb drip, fluids Continue with mechanical ventilation ABG in the a.m. Prognosis is guarded -family at bedside but not able to make decisions for patient as she is a ward of the state Acute renal failure In setting of hypovolemia and UTI Continue Rocephin Continue fluids Avoid nephrotoxic agents when possible Creatinine up to 3.7, baseline 2.7 Nephrology consulted, patient not a dialysis candidate, nephrology to see in the a.m. Nephrology recommendations appreciated, will continue bicarb drip, albuterol was ordered, will continue Kayexalate per rectal tube, calcium gluconate was given Trend labs in the a.m. Acute metabolic encephalopathy Likely multifactorial with sepsis, elevated BUN, electrolyte disturbances all contributing Treat underlying problems Baseline is unclear but patient is a  resident of the dementia unit at the [facility] Sepsis secondary to UTI Continue Rocephin Patient is a temperature 83, leukopenia at 3.2, lactic acid 3.5, AKI UA suspicious for UTI Urine culture and blood cultures pending Continue IV fluids Continue to monitor Hyperkalemia Calcium gluconate, bicarb, albuterol, Lokelma, IV fluids, insulin Trend potassium on morning labs Continue to monitor Mild protein calorie malnutrition Patient is able to tolerate p.o. encourage nutrient dense food and/or start feeding supplement    DVT Prophylaxis-   Heparin- SCDs   AM Labs Ordered, also please review Full Orders  Code Status: Full  Admission status: Inpatient :The appropriate admission status for this patient is INPATIENT. Inpatient  status is judged to be reasonable and necessary in order to provide the required intensity of service to ensure the patient's safety. The patient's presenting symptoms, physical exam findings, and initial radiographic and laboratory data in the context of their chronic comorbidities is felt to place them at high risk for further clinical deterioration. Furthermore, it is not anticipated that the patient will be medically stable for discharge from the hospital within 2 midnights of admission. The following factors support the admission status of inpatient.     The patient's presenting symptoms include agitation. The worrisome physical exam findings include hypothermia, cardiac arrest The initial radiographic and laboratory data are worrisome because of AKI, hyperkalemia, lactic acidosis, UTI. The chronic co-morbidities include coronary artery disease, history of stroke, hypothyroidism, diabetes mellitus type 2.       * I certify that at the point of admission it is my clinical judgment that the patient will require inpatient hospital care spanning beyond 2 midnights from the point of admission due to high intensity of service, high risk for further deterioration and high frequency of surveillance required.*  Time spent in minutes : [place]

## 2021-08-12 NOTE — ED Notes (Signed)
Pt yelling out, removing clothing and attempting to get out of bed.  MD made aware, orders placed.

## 2021-08-12 NOTE — ED Notes (Signed)
Gaetano Net Director at [facility] called to confirm pt has passed away. Son had called to state he was coming to get her belongings and the Center wanted to confirm her passing.

## 2021-08-12 DEATH — deceased

## 2021-08-18 ENCOUNTER — Ambulatory Visit: Payer: Medicare Other | Admitting: Vascular Surgery
